# Patient Record
Sex: Female | Born: 1955
Health system: Southern US, Community
[De-identification: ages and names within clinical notes are randomized; demographics above are authoritative.]

## PROBLEM LIST (undated history)

## (undated) DIAGNOSIS — L209 Atopic dermatitis, unspecified: Secondary | ICD-10-CM

## (undated) DIAGNOSIS — M5417 Radiculopathy, lumbosacral region: Secondary | ICD-10-CM

## (undated) DIAGNOSIS — Z720 Tobacco use: Secondary | ICD-10-CM

## (undated) DIAGNOSIS — I10 Essential (primary) hypertension: Secondary | ICD-10-CM

## (undated) DIAGNOSIS — M1990 Unspecified osteoarthritis, unspecified site: Secondary | ICD-10-CM

## (undated) DIAGNOSIS — H548 Legal blindness, as defined in USA: Secondary | ICD-10-CM

## (undated) DIAGNOSIS — G4730 Sleep apnea, unspecified: Secondary | ICD-10-CM

## (undated) DIAGNOSIS — K219 Gastro-esophageal reflux disease without esophagitis: Secondary | ICD-10-CM

## (undated) DIAGNOSIS — G8929 Other chronic pain: Secondary | ICD-10-CM

## (undated) DIAGNOSIS — IMO0002 Reserved for concepts with insufficient information to code with codable children: Secondary | ICD-10-CM

## (undated) DIAGNOSIS — M545 Low back pain, unspecified: Secondary | ICD-10-CM

## (undated) DIAGNOSIS — J449 Chronic obstructive pulmonary disease, unspecified: Secondary | ICD-10-CM

## (undated) DIAGNOSIS — R809 Proteinuria, unspecified: Secondary | ICD-10-CM

## (undated) DIAGNOSIS — T7840XA Allergy, unspecified, initial encounter: Secondary | ICD-10-CM

## (undated) DIAGNOSIS — E119 Type 2 diabetes mellitus without complications: Secondary | ICD-10-CM

## (undated) DIAGNOSIS — K029 Dental caries, unspecified: Secondary | ICD-10-CM

## (undated) DIAGNOSIS — M8580 Other specified disorders of bone density and structure, unspecified site: Secondary | ICD-10-CM

## (undated) DIAGNOSIS — E2839 Other primary ovarian failure: Secondary | ICD-10-CM

## (undated) DIAGNOSIS — E785 Hyperlipidemia, unspecified: Secondary | ICD-10-CM

## (undated) DIAGNOSIS — R0600 Dyspnea, unspecified: Secondary | ICD-10-CM

## (undated) DIAGNOSIS — R6889 Other general symptoms and signs: Secondary | ICD-10-CM

## (undated) HISTORY — DX: Reserved for concepts with insufficient information to code with codable children: IMO0002

## (undated) HISTORY — DX: Low back pain: M54.5

## (undated) HISTORY — DX: Low back pain, unspecified: M54.50

## (undated) HISTORY — DX: Other primary ovarian failure: E28.39

## (undated) HISTORY — DX: Other specified disorders of bone density and structure, unspecified site: M85.80

## (undated) HISTORY — DX: Radiculopathy, lumbosacral region: M54.17

## (undated) HISTORY — DX: Allergy, unspecified, initial encounter: T78.40XA

## (undated) HISTORY — DX: Type 2 diabetes mellitus without complications: E11.9

## (undated) HISTORY — DX: Chronic obstructive pulmonary disease, unspecified: J44.9

## (undated) HISTORY — DX: Gastro-esophageal reflux disease without esophagitis: K21.9

## (undated) HISTORY — PX: EYE SURGERY: SHX253

## (undated) HISTORY — DX: Dental caries, unspecified: K02.9

## (undated) HISTORY — DX: Proteinuria, unspecified: R80.9

## (undated) HISTORY — DX: Hyperlipidemia, unspecified: E78.5

## (undated) HISTORY — DX: Atopic dermatitis, unspecified: L20.9

## (undated) HISTORY — DX: Tobacco use: Z72.0

## (undated) HISTORY — DX: Other general symptoms and signs: R68.89

## (undated) HISTORY — DX: Essential (primary) hypertension: I10

## (undated) HISTORY — DX: Other chronic pain: G89.29

## (undated) HISTORY — PX: CORNEAL TRANSPLANT: SHX108

## (undated) NOTE — *Deleted (*Deleted)
[facility]  Name: Abraham Margulies  MRN: [id number] DOB: Jun 04, 1956  Chief Complaint/ HPI  [name],  10 y.o. , female presents for their Follow-Up CCM visit with the clinical pharmacist via telephone due to COVID-19 Pandemic.  PCP : Alba Cory, MD  Their chronic conditions include: DM, HTN, HLD  Office Visits: 04/27/20: Video visit with Henry Russel, PA-C for Rhinosinusitis. Patient started on cetirizine 10 mg daily, doxycycline 100 mg twice daily  03/02/20: Patient presented to Dr. Carlynn Purl for follow-up. Robitussin stopped (Completed course).   Consult Visit: 9/2 Covid-19, [facility], Acute resp failure, 8 day stay  Medications: Outpatient Encounter Medications as of 05/06/2020  Medication Sig  . albuterol (VENTOLIN HFA) 108 (90 Base) MCG/ACT inhaler Inhale 2 puffs into the lungs every 6 (six) hours as needed for wheezing or shortness of breath.  Marland Kitchen ascorbic acid (VITAMIN C) 500 MG tablet Take 1 tablet (500 mg total) by mouth daily.  Marland Kitchen aspirin 81 MG chewable tablet Chew 1 tablet by mouth daily.  Marland Kitchen atorvastatin (LIPITOR) 40 MG tablet Take 1 tablet (40 mg total) by mouth daily. In place of simvastatin  . cetirizine (ZYRTEC) 10 MG tablet Take 1 tablet (10 mg total) by mouth daily.  . cholecalciferol (VITAMIN D3) 25 MCG (1000 UNIT) tablet Take 1 tablet (1,000 Units total) by mouth daily.  . dorzolamide-timolol (COSOPT) 22.3-6.8 MG/ML ophthalmic solution Place 1 drop into both eyes 2 (two) times daily.   . empagliflozin (JARDIANCE) 25 MG TABS tablet Take 1 tablet (25 mg total) by mouth daily.  Marland Kitchen erythromycin ophthalmic ointment SMARTSIG:In Eye(s)  . folic acid (FOLVITE) 1 MG tablet Take 1 mg by mouth daily.   Marland Kitchen gabapentin (NEURONTIN) 100 MG capsule Take 200 mg by mouth 3 (three) times daily.  Marland Kitchen glucose blood test strip Check blood sugar once daily fasting, and up to 4 times daily PRN (Patient taking differently: Check blood sugar once daily fasting, and up to 4 times  daily PRN ACCU CHEK smartview)  . HYDROcodone-acetaminophen (NORCO/VICODIN) 5-325 MG tablet Take 2 tablets by mouth 2 (two) times daily as needed for moderate pain.  Demetra [employer] (SIMPLE DIAGNOSTICS LANCING DEV) MISC Check blood sugar once daily fasting, and up to 4 times daily PRN.  Marland Kitchen losartan (COZAAR) 25 MG tablet Take 0.5 tablets (12.5 mg total) by mouth daily. In place of lisinopril  . metFORMIN (GLUCOPHAGE-XR) 750 MG 24 hr tablet Take 2 tablets (1,500 mg total) by mouth daily with breakfast.  . methotrexate (RHEUMATREX) 2.5 MG tablet Take 15 mg by mouth every Friday.   Marland Kitchen POLYTRIM ophthalmic solution Place into the left eye.  Marland Kitchen rOPINIRole (REQUIP XL) 4 MG 24 hr tablet Take 4 mg by mouth at bedtime.   . triamcinolone ointment (KENALOG) 0.1 % Apply topically 2 (two) times daily.  Marland Kitchen umeclidinium-vilanterol (ANORO ELLIPTA) 62.5-25 MCG/INH AEPB Inhale 1 puff into the lungs daily.  Marland Kitchen VIGAMOX 0.5 % ophthalmic solution   . zinc sulfate 220 (50 Zn) MG capsule Take 1 capsule (220 mg total) by mouth daily.   No facility-administered encounter medications on file as of 05/06/2020.     Financial Resource Strain: Low Risk   . Difficulty of Paying Living Expenses: Not very hard     Current Diagnosis/Assessment:  Goals Addressed   None    Hypertension   BP goal is:  <130/80  Office blood pressures are  BP Readings from Last 3 Encounters:  03/02/20 108/70  02/11/20 116/77  11/05/19 126/72  Patient checks BP at home infrequently Patient home BP readings are ranging: NA  Patient has failed these meds in the past: NA Patient is currently controlled on the following medications:  . Losartan 25 mg 1/2 tablet daily   We discussed:  Plan  Continue current medications and control with diet and exercise   Hyperlipidemia   LDL goal < 70  Lipid Panel     Component Value Date/Time   CHOL 91 09/05/2019 1108   CHOL 119 04/02/2015 0956   TRIG 56 09/05/2019 1108   HDL 45 (L)  09/05/2019 1108   HDL 49 04/02/2015 0956   LDLCALC 32 09/05/2019 1108    Hepatic Function Latest Ref Rng & Units 02/10/2020 02/09/2020 02/08/2020  Total Protein 6.5 - 8.1 g/dL 7.0 7.2 7.3  Albumin 3.5 - 5.0 g/dL 2.7(L) 2.8(L) 3.0(L)  AST 15 - 41 U/L 27 41 44(H)  ALT 0 - 44 U/L 23 23 20   Alk Phosphatase 38 - 126 U/L 80 84 75  Total Bilirubin 0.3 - 1.2 mg/dL 0.6 0.8 0.8     The ASCVD Risk score Denman George DC Jr., et al., 2013) failed to calculate for the following reasons:   The valid total cholesterol range is 130 to 320 mg/dL   Patient has failed these meds in past: NA Patient is currently controlled on the following medications:  . Atorvastatin 40mg  daily  We discussed:   Plan  Continue current medications  Diabetes   A1c goal <7%  Recent Relevant Labs: Lab Results  Component Value Date/Time   HGBA1C 7.3 (A) 03/02/2020 12:04 PM   HGBA1C 7.1 (A) 09/05/2019 10:05 AM   HGBA1C 7.3 (A) 10/21/2018 09:51 AM   HGBA1C 7.2 (A) 04/19/2018 11:35 AM   HGBA1C 6.5 (A) 01/16/2018 10:56 AM   MICROALBUR 0.2 09/05/2019 11:08 AM   MICROALBUR <0.2 10/21/2018 10:24 AM   MICROALBUR negative 01/16/2018 10:57 AM   MICROALBUR 20 12/13/2016 10:24 AM    Last diabetic Eye exam:  Lab Results  Component Value Date/Time   HMDIABEYEEXA No Retinopathy 06/26/2018 12:00 AM    Last diabetic Foot exam:  Lab Results  Component Value Date/Time   HMDIABFOOTEX normal 08/04/2015 12:00 AM     Checking BG: Daily  Recent FBG Readings: *** Recent pre-meal BG readings: *** Recent 2hr PP BG readings:  *** Recent HS BG readings: ***  Patient has failed these meds in past: n/a Patient is currently controlled on the following medications: . Jardiance 25 mg daily  . Metformin XR 750 mg 2 tabs with breakfast   We discussed: {[facility]L [facility] discussion:[phone]}  Plan  Continue {[facility]L [facility] VHQIO:[phone]}   Tobacco Abuse   Tobacco Status:  Social History   Tobacco Use   Smoking Status Former Smoker  . Packs/day: 0.50  . Years: 46.00  . Pack years: 23.00  . Types: Cigarettes  . Start date: 01/07/1985  . Quit date: 02/01/2020  . Years since quitting: 0.2  Smokeless Tobacco Never Used     Previous quit attempts included: 02/05/20 Patient is currently controlled on the following medications:  . None  We discussed: Has not smoked since leaving the hospital, following Covid-19 acute respiratory failure  Plan  Call [phone] for post quitting support

---

## 1978-06-05 HISTORY — PX: CATARACT EXTRACTION: SUR2

## 2004-09-08 ENCOUNTER — Ambulatory Visit: Payer: Self-pay

## 2005-01-12 ENCOUNTER — Ambulatory Visit: Payer: Self-pay | Admitting: Family Medicine

## 2005-08-01 ENCOUNTER — Emergency Department: Payer: Self-pay | Admitting: Internal Medicine

## 2005-08-01 ENCOUNTER — Ambulatory Visit: Payer: Self-pay | Admitting: Family Medicine

## 2005-08-29 ENCOUNTER — Emergency Department: Payer: Self-pay | Admitting: Emergency Medicine

## 2005-09-22 DIAGNOSIS — I10 Essential (primary) hypertension: Secondary | ICD-10-CM | POA: Insufficient documentation

## 2005-09-22 DIAGNOSIS — E039 Hypothyroidism, unspecified: Secondary | ICD-10-CM | POA: Insufficient documentation

## 2006-07-08 ENCOUNTER — Emergency Department: Payer: Self-pay | Admitting: Internal Medicine

## 2007-03-21 ENCOUNTER — Emergency Department: Payer: Self-pay | Admitting: Emergency Medicine

## 2009-04-22 ENCOUNTER — Ambulatory Visit: Payer: Self-pay | Admitting: Family Medicine

## 2010-06-15 LAB — HM DEXA SCAN: HM Dexa Scan: NORMAL

## 2010-07-04 ENCOUNTER — Ambulatory Visit: Payer: Self-pay | Admitting: Family Medicine

## 2010-07-05 LAB — HM COLONOSCOPY

## 2012-04-10 ENCOUNTER — Ambulatory Visit: Payer: Self-pay | Admitting: Family Medicine

## 2013-09-03 DIAGNOSIS — IMO0002 Reserved for concepts with insufficient information to code with codable children: Secondary | ICD-10-CM

## 2013-09-03 HISTORY — DX: Reserved for concepts with insufficient information to code with codable children: IMO0002

## 2013-10-02 LAB — HM PAP SMEAR: HM PAP: ABNORMAL

## 2013-11-12 ENCOUNTER — Ambulatory Visit: Payer: Self-pay | Admitting: Family Medicine

## 2013-11-12 LAB — HM MAMMOGRAPHY: HM MAMMO: NORMAL

## 2013-11-14 ENCOUNTER — Ambulatory Visit: Payer: Self-pay | Admitting: Family Medicine

## 2014-03-23 LAB — LIPID PANEL
Cholesterol: 115 mg/dL (ref 0–200)
HDL: 48 mg/dL (ref 35–70)
LDL Cholesterol: 55 mg/dL
Triglycerides: 62 mg/dL (ref 40–160)

## 2014-08-27 LAB — HEMOGLOBIN A1C: Hgb A1c MFr Bld: 6.8 % — AB (ref 4.0–6.0)

## 2014-11-12 ENCOUNTER — Telehealth: Payer: Self-pay | Admitting: Family Medicine

## 2014-11-12 NOTE — Telephone Encounter (Signed)
Pt has been coughing for the last 1 week and would like to know what she can take

## 2014-11-12 NOTE — Telephone Encounter (Signed)
Pt wants to know what is safe for her to take for coughing?

## 2014-11-13 NOTE — Telephone Encounter (Signed)
Patient notified

## 2014-11-13 NOTE — Telephone Encounter (Signed)
She can take any otc cough medication and monitor her bp at home.

## 2014-12-16 ENCOUNTER — Encounter: Payer: Self-pay | Admitting: Family Medicine

## 2014-12-28 ENCOUNTER — Encounter: Payer: Self-pay | Admitting: Family Medicine

## 2014-12-28 DIAGNOSIS — R809 Proteinuria, unspecified: Secondary | ICD-10-CM | POA: Insufficient documentation

## 2014-12-28 DIAGNOSIS — L209 Atopic dermatitis, unspecified: Secondary | ICD-10-CM | POA: Insufficient documentation

## 2014-12-28 DIAGNOSIS — K219 Gastro-esophageal reflux disease without esophagitis: Secondary | ICD-10-CM | POA: Insufficient documentation

## 2014-12-28 DIAGNOSIS — M949 Disorder of cartilage, unspecified: Secondary | ICD-10-CM

## 2014-12-28 DIAGNOSIS — Z720 Tobacco use: Secondary | ICD-10-CM | POA: Insufficient documentation

## 2014-12-28 DIAGNOSIS — M792 Neuralgia and neuritis, unspecified: Secondary | ICD-10-CM | POA: Insufficient documentation

## 2014-12-28 DIAGNOSIS — J3089 Other allergic rhinitis: Secondary | ICD-10-CM

## 2014-12-28 DIAGNOSIS — G8929 Other chronic pain: Secondary | ICD-10-CM | POA: Insufficient documentation

## 2014-12-28 DIAGNOSIS — M899 Disorder of bone, unspecified: Secondary | ICD-10-CM | POA: Insufficient documentation

## 2014-12-28 DIAGNOSIS — G5792 Unspecified mononeuropathy of left lower limb: Secondary | ICD-10-CM | POA: Insufficient documentation

## 2014-12-28 DIAGNOSIS — M545 Low back pain, unspecified: Secondary | ICD-10-CM | POA: Insufficient documentation

## 2014-12-28 DIAGNOSIS — J302 Other seasonal allergic rhinitis: Secondary | ICD-10-CM | POA: Insufficient documentation

## 2014-12-28 DIAGNOSIS — E785 Hyperlipidemia, unspecified: Secondary | ICD-10-CM | POA: Insufficient documentation

## 2015-01-08 ENCOUNTER — Encounter: Payer: Self-pay | Admitting: Family Medicine

## 2015-01-08 ENCOUNTER — Ambulatory Visit (INDEPENDENT_AMBULATORY_CARE_PROVIDER_SITE_OTHER): Payer: Medicare Other | Admitting: Family Medicine

## 2015-01-08 VITALS — BP 126/64 | HR 96 | Temp 98.0°F | Resp 18 | Ht 58.25 in | Wt 184.1 lb

## 2015-01-08 DIAGNOSIS — M545 Low back pain, unspecified: Secondary | ICD-10-CM

## 2015-01-08 DIAGNOSIS — G2581 Restless legs syndrome: Secondary | ICD-10-CM | POA: Diagnosis not present

## 2015-01-08 DIAGNOSIS — K429 Umbilical hernia without obstruction or gangrene: Secondary | ICD-10-CM

## 2015-01-08 DIAGNOSIS — J449 Chronic obstructive pulmonary disease, unspecified: Secondary | ICD-10-CM | POA: Diagnosis not present

## 2015-01-08 DIAGNOSIS — E785 Hyperlipidemia, unspecified: Secondary | ICD-10-CM | POA: Diagnosis not present

## 2015-01-08 DIAGNOSIS — Z23 Encounter for immunization: Secondary | ICD-10-CM | POA: Diagnosis not present

## 2015-01-08 DIAGNOSIS — E1129 Type 2 diabetes mellitus with other diabetic kidney complication: Secondary | ICD-10-CM

## 2015-01-08 DIAGNOSIS — G8929 Other chronic pain: Secondary | ICD-10-CM

## 2015-01-08 DIAGNOSIS — G9519 Other vascular myelopathies: Secondary | ICD-10-CM

## 2015-01-08 DIAGNOSIS — R809 Proteinuria, unspecified: Secondary | ICD-10-CM | POA: Diagnosis not present

## 2015-01-08 DIAGNOSIS — E1169 Type 2 diabetes mellitus with other specified complication: Secondary | ICD-10-CM | POA: Insufficient documentation

## 2015-01-08 DIAGNOSIS — Z79899 Other long term (current) drug therapy: Secondary | ICD-10-CM | POA: Diagnosis not present

## 2015-01-08 DIAGNOSIS — Z720 Tobacco use: Secondary | ICD-10-CM | POA: Diagnosis not present

## 2015-01-08 DIAGNOSIS — IMO0001 Reserved for inherently not codable concepts without codable children: Secondary | ICD-10-CM

## 2015-01-08 DIAGNOSIS — M4806 Spinal stenosis, lumbar region: Secondary | ICD-10-CM | POA: Diagnosis not present

## 2015-01-08 DIAGNOSIS — R29818 Other symptoms and signs involving the nervous system: Secondary | ICD-10-CM

## 2015-01-08 DIAGNOSIS — M48062 Spinal stenosis, lumbar region with neurogenic claudication: Secondary | ICD-10-CM

## 2015-01-08 LAB — POCT UA - MICROALBUMIN: Microalbumin Ur, POC: 20 mg/L

## 2015-01-08 LAB — POCT GLYCOSYLATED HEMOGLOBIN (HGB A1C): Hemoglobin A1C: 6.7

## 2015-01-08 MED ORDER — SITAGLIPTIN PHOS-METFORMIN HCL 50-1000 MG PO TABS: 1.0000 | ORAL_TABLET | Freq: Two times a day (BID) | ORAL | Status: DC

## 2015-01-08 MED ORDER — GABAPENTIN 300 MG PO CAPS: 300.0000 mg | ORAL_CAPSULE | Freq: Three times a day (TID) | ORAL | Status: DC

## 2015-01-08 MED ORDER — HYDROCODONE-ACETAMINOPHEN 5-300 MG PO TABS: 1.0000 | ORAL_TABLET | Freq: Two times a day (BID) | ORAL | Status: DC | PRN

## 2015-01-08 MED ORDER — SIMVASTATIN 10 MG PO TABS: 10.0000 mg | ORAL_TABLET | Freq: Every day | ORAL | Status: DC

## 2015-01-08 MED ORDER — LISINOPRIL 5 MG PO TABS: 5.0000 mg | ORAL_TABLET | Freq: Every day | ORAL | Status: DC

## 2015-01-08 MED ORDER — ROPINIROLE HCL 1 MG PO TABS: 1.0000 mg | ORAL_TABLET | Freq: Every day | ORAL | Status: DC

## 2015-01-08 MED ORDER — HYDROCODONE-ACETAMINOPHEN 5-325 MG PO TABS: 2.0000 | ORAL_TABLET | Freq: Two times a day (BID) | ORAL | Status: DC

## 2015-01-08 NOTE — Progress Notes (Signed)
Name: Julie Wall   MRN: [id number]    DOB: 12-Jun-1955   Date:01/08/2015       Progress Note  Subjective  Chief Complaint  Chief Complaint  Patient presents with  . Medication Refill    4 month F/U  . Diabetes    Checks sugar once daily Average-103 High-145  . Hyperlipidemia    Muscle Cramps  . Gastrophageal Reflux    Still having some burning  . COPD    SOB    HPI  DMII: she has neurogenic claudication and history of proteinuria.  She has been compliant with medication and hgbA1C is at goal. Denies polyphagia, polydipsia or polyuria.    Hyperlipidemia: taking simvastatin and is due for labs, denies side effects of medications  COPD: she still smokes daily, has intermittent cough, but is not productive, only using Combivent prn.   Back pain: she has back pain most days of the week, pain now is zero but at times can go up to 9/10. Worse with increase in activity, taking hydrocodone prn and flexeril at night sometimes to control symptoms. She denies paresthesia or leg weakness, no bowel or bladder incontinence  RLS: controlled with medication   Patient Active Problem List   Diagnosis Date Noted  . RLS (restless legs syndrome) 01/08/2015  . Type 2 diabetes mellitus with other diabetic kidney complication 01/08/2015  . AD (atopic dermatitis) 12/28/2014  . Chronic LBP 12/28/2014  . Dyslipidemia 12/28/2014  . Gastro-esophageal reflux disease without esophagitis 12/28/2014  . Microalbuminuria 12/28/2014  . Disorder of bone and cartilage 12/28/2014  . Obesity (BMI 30-39.9) 12/28/2014  . Neuralgia of left thigh 12/28/2014  . Perennial allergic rhinitis with seasonal variation 12/28/2014  . Tobacco abuse 12/28/2014    Past Surgical History  Procedure Laterality Date  . Cesarean section    . Cataract extraction Bilateral 1980  . Corneal transplant Left     [facility]-Treatment for blindness.    Family History  Problem Relation Age of Onset  . Diabetes Mother   . Hypertension  Mother   . Alcohol abuse Father     History   Social History  . Marital Status: Single    Spouse Name: N/A  . Number of Children: N/A  . Years of Education: N/A   Occupational History  . Not on file.   Social History Main Topics  . Smoking status: Current Every Day Smoker -- 0.75 packs/day for 30 years    Types: Cigarettes    Start date: 01/07/1985  . Smokeless tobacco: Never Used  . Alcohol Use: No  . Drug Use: No  . Sexual Activity: No   Other Topics Concern  . Not on file   Social History Narrative     Current outpatient prescriptions:  .  aspirin 81 MG chewable tablet, Chew 1 tablet by mouth daily., Disp: , Rfl:  .  cyclobenzaprine (FLEXERIL) 5 MG tablet, Take 1 tablet by mouth at bedtime., Disp: , Rfl:  .  fluticasone (FLONASE) 50 MCG/ACT nasal spray, Place 2 sprays into the nose at bedtime., Disp: , Rfl:  .  gabapentin (NEURONTIN) 300 MG capsule, Take 1 capsule (300 mg total) by mouth 3 (three) times daily., Disp: 90 capsule, Rfl: 5 .  Hydrocodone-Acetaminophen (VICODIN) 5-300 MG TABS, Take 1 tablet by mouth 2 (two) times daily as needed., Disp: 60 each, Rfl: 0 .  Ipratropium-Albuterol (COMBIVENT RESPIMAT) 20-100 MCG/ACT AERS respimat, Inhale 1 puff into the lungs 4 (four) times daily., Disp: , Rfl:  .  meloxicam (MOBIC) 7.5 MG tablet, Take 1 tablet by mouth daily., Disp: , Rfl:  .  METHOTREXATE, ANTI-RHEUMATIC, PO, Take 3 tablets by mouth once a week., Disp: , Rfl:  .  omeprazole (PRILOSEC) 20 MG capsule, Take 1 capsule by mouth every morning., Disp: , Rfl:  .  simvastatin (ZOCOR) 10 MG tablet, Take 1 tablet (10 mg total) by mouth daily., Disp: 30 tablet, Rfl: 5 .  sitaGLIPtin-metformin (JANUMET) 50-1000 MG per tablet, Take 1 tablet by mouth 2 (two) times daily., Disp: 60 tablet, Rfl: 5 .  triamcinolone cream (KENALOG) 0.1 %, TRIAMCINOLONE ACETONIDE, 0.1% (External Cream)  use per dermatologist. for 0 days  Quantity: 1.00;  Refills: 0   Ordered :15-Jun-2010  Alba Cory MD;  Started 02-Apr-2008 Active Comments: DX: 724.2, Disp: , Rfl:  .  lisinopril (PRINIVIL,ZESTRIL) 5 MG tablet, Take 1 tablet (5 mg total) by mouth daily., Disp: 30 tablet, Rfl: 5 .  rOPINIRole (REQUIP) 1 MG tablet, Take 1 tablet (1 mg total) by mouth daily., Disp: 30 tablet, Rfl: 5  Allergies  Allergen Reactions  . Penicillins      ROS  Constitutional: Negative for fever or weight change.  Respiratory: positive  for cough and shortness of breath.   Cardiovascular: Negative for chest pain or palpitations.  Gastrointestinal: Negative for abdominal pain, no bowel changes.  Musculoskeletal: Negative for gait problem or joint swelling.  Skin: Negative for rash.  Neurological: Negative for dizziness or headache. She has aching legs all the time No other specific complaints in a complete review of systems (except as listed in HPI above). Objective  Filed Vitals:   01/08/15 0826 01/08/15 0859  BP: 126/64   Pulse: 115 96  Temp: 98 F (36.7 C)   TempSrc: Oral   Resp: 18   Height: 4' 10.25" (1.48 m)   Weight: 184 lb 1.6 oz (83.507 kg)   SpO2: 98%     Body mass index is 38.12 kg/(m^2).  Physical Exam  Constitutional: Patient appears well-developed and well-nourished. Obese No distress.  Eyes:  No scleral icterus. PERL Neck: Normal range of motion. Neck supple. Cardiovascular: Normal rate, regular rhythm and normal heart sounds.  No murmur heard. No BLE edema. Pulmonary/Chest: Effort normal and breath sounds normal. No respiratory distress. Abdominal: Soft.  There is no tenderness. Umbilical hernia, some left flank pain that seems muscular, neg CVA tenderness Psychiatric: Patient has a normal mood and affect. behavior is normal. Judgment and thought content normal. Skin: hypochromic patches all over, eczema under control , hyperpigmentation on both cheeks  Muscular skeletal: pain during palpation of thoracic and lumbar spine , normal rom of lumbar spine and negative  straight leg raise  Recent Results (from the past 2160 hour(s))  POCT HgB A1C     Status: None   Collection Time: 01/08/15  8:37 AM  Result Value Ref Range   Hemoglobin A1C 6.7   POCT UA - Microalbumin     Status: None   Collection Time: 01/08/15  8:37 AM  Result Value Ref Range   Microalbumin Ur, POC 20 mg/L   Creatinine, POC  mg/dL   Albumin/Creatinine Ratio, Urine, POC      Diabetic Foot Exam: Diabetic Foot Exam - Simple   Simple Foot Form  Visual Inspection  No deformities, no ulcerations, no other skin breakdown bilaterally:  Yes  Sensation Testing  Intact to touch and monofilament testing bilaterally:  Yes  Pulse Check  Posterior Tibialis and Dorsalis pulse intact bilaterally:  Yes  Comments      PHQ2/9: Depression screen PHQ 2/9 01/08/2015  Decreased Interest 0  Down, Depressed, Hopeless 0  PHQ - 2 Score 0     Fall Risk: Fall Risk  01/08/2015  Falls in the past year? No     Assessment & Plan  1. Type 2 diabetes mellitus with other diabetic kidney complication  - POCT HgB A1C - POCT UA - Microalbumin - sitaGLIPtin-metformin (JANUMET) 50-1000 MG per tablet; Take 1 tablet by mouth 2 (two) times daily.  Dispense: 60 tablet; Refill: 5 - lisinopril (PRINIVIL,ZESTRIL) 5 MG tablet; Take 1 tablet (5 mg total) by mouth daily.  Dispense: 30 tablet; Refill: 5  2. COPD with bronchitis FEV1/FVC within normal limits - Spirometry: Peak  3. Tobacco abuse Discussed importance of quitting again  4. Microalbuminuria Resume ace low dose - lisinopril (PRINIVIL,ZESTRIL) 5 MG tablet; Take 1 tablet (5 mg total) by mouth daily.  Dispense: 30 tablet; Refill: 5  5. Dyslipidemia  - simvastatin (ZOCOR) 10 MG tablet; Take 1 tablet (10 mg total) by mouth daily.  Dispense: 30 tablet; Refill: 5 - Lipid Profile  6. RLS (restless legs syndrome)  - rOPINIRole (REQUIP) 1 MG tablet; Take 1 tablet (1 mg total) by mouth daily.  Dispense: 30 tablet; Refill: 5  7. Chronic LBP Taking  Hydrocodone prn . During exam some pain on left flank , but negative CVA, seems muscular , advised to take Flexeril for the next few days - gabapentin (NEURONTIN) 300 MG capsule; Take 1 capsule (300 mg total) by mouth 3 (three) times daily.  Dispense: 90 capsule; Refill: 5 - Hydrocodone-Acetaminophen (VICODIN) 5-325 MG TABS; Take 1 tablet by mouth 2 (two) times daily as needed.  Dispense: 60 each; Refill: 0  8. [place]-term use of high-risk medication  - Comprehensive Metabolic Panel (CMET)

## 2015-01-11 ENCOUNTER — Telehealth: Payer: Self-pay | Admitting: Family Medicine

## 2015-01-11 NOTE — Telephone Encounter (Signed)
Patient was seen last week and now have developed cold symptoms. Cough, nose and chest congestion. No fever. [facility]

## 2015-01-11 NOTE — Telephone Encounter (Signed)
She needs to take otc cold medication. Dayquil, Nyquil... Nasal saline , rest and come in if no improvement in 10 days

## 2015-01-11 NOTE — Telephone Encounter (Signed)
Pt.notified

## 2015-04-01 ENCOUNTER — Ambulatory Visit
Admission: RE | Admit: 2015-04-01 | Discharge: 2015-04-01 | Disposition: A | Discharge status: Home / Self Care | Payer: Medicare Other | Source: Ambulatory Visit | Attending: Family Medicine | Admitting: Family Medicine

## 2015-04-01 ENCOUNTER — Ambulatory Visit (INDEPENDENT_AMBULATORY_CARE_PROVIDER_SITE_OTHER): Payer: Medicare Other | Admitting: Family Medicine

## 2015-04-01 ENCOUNTER — Encounter: Payer: Self-pay | Admitting: Family Medicine

## 2015-04-01 VITALS — BP 106/62 | HR 103 | Temp 98.3°F | Resp 16 | Ht 61.0 in | Wt 182.9 lb

## 2015-04-01 DIAGNOSIS — R1012 Left upper quadrant pain: Secondary | ICD-10-CM

## 2015-04-01 DIAGNOSIS — E785 Hyperlipidemia, unspecified: Secondary | ICD-10-CM

## 2015-04-01 DIAGNOSIS — Z23 Encounter for immunization: Secondary | ICD-10-CM | POA: Diagnosis not present

## 2015-04-01 DIAGNOSIS — K429 Umbilical hernia without obstruction or gangrene: Secondary | ICD-10-CM | POA: Insufficient documentation

## 2015-04-01 DIAGNOSIS — J45909 Unspecified asthma, uncomplicated: Secondary | ICD-10-CM | POA: Diagnosis not present

## 2015-04-01 DIAGNOSIS — R062 Wheezing: Secondary | ICD-10-CM | POA: Diagnosis not present

## 2015-04-01 DIAGNOSIS — Q792 Exomphalos: Secondary | ICD-10-CM | POA: Insufficient documentation

## 2015-04-01 DIAGNOSIS — R140 Abdominal distension (gaseous): Secondary | ICD-10-CM

## 2015-04-01 DIAGNOSIS — Z79899 Other long term (current) drug therapy: Secondary | ICD-10-CM

## 2015-04-01 DIAGNOSIS — J209 Acute bronchitis, unspecified: Secondary | ICD-10-CM

## 2015-04-01 DIAGNOSIS — R110 Nausea: Secondary | ICD-10-CM

## 2015-04-01 MED ORDER — IPRATROPIUM-ALBUTEROL 20-100 MCG/ACT IN AERS: 1.0000 | INHALATION_SPRAY | Freq: Four times a day (QID) | RESPIRATORY_TRACT | Status: DC

## 2015-04-01 MED ORDER — FLUTICASONE FUROATE-VILANTEROL 100-25 MCG/INH IN AEPB: 1.0000 | INHALATION_SPRAY | Freq: Every day | RESPIRATORY_TRACT | Status: DC

## 2015-04-01 NOTE — Progress Notes (Signed)
Name: Julie Wall   MRN: [id number]    DOB: 12/07/1955   Date:04/01/2015       Progress Note  Subjective  Chief Complaint  Chief Complaint  Patient presents with  . Flank Pain    upper left onset 3 months.  Pt describes as a lump feeling and worsening when she lays down    HPI  Wheezing: she continues to smoke, over the past few weeks has noticed daily wheezing, dry cough and decrease in exercise tolerance. No fever, no chills. She also has sneezing, and some clear rhinorrhea. Normal spirometry on her last visit.   Left Upper Quadrant pain: she has been having recurrent symptoms over the past couple of months. Pain is on left flank or left upper quadrant, feels a ball that grows inside of her. Worse at night when trying to fall asleep and has to toss and turn to get in a comfortable position, she also has nausea ( watery mouth ), no vomiting, no change in bowel movement, no dysuria, no hematuria. Sometimes bloating.    Patient Active Problem List   Diagnosis Date Noted  . Umbilical hernia 04/01/2015  . RLS (restless legs syndrome) 01/08/2015  . Type 2 diabetes mellitus with other diabetic kidney complication (HCC) 01/08/2015  . Neurogenic claudication 01/08/2015  . AD (atopic dermatitis) 12/28/2014  . Chronic LBP 12/28/2014  . Dyslipidemia 12/28/2014  . Gastro-esophageal reflux disease without esophagitis 12/28/2014  . Microalbuminuria 12/28/2014  . Disorder of bone and cartilage 12/28/2014  . Obesity (BMI 30-39.9) 12/28/2014  . Neuralgia of left thigh 12/28/2014  . Perennial allergic rhinitis with seasonal variation 12/28/2014  . Tobacco abuse 12/28/2014    Past Surgical History  Procedure Laterality Date  . Cesarean section    . Cataract extraction Bilateral 1980  . Corneal transplant Left     [facility]-Treatment for blindness.    Family History  Problem Relation Age of Onset  . Diabetes Mother   . Hypertension Mother   . Alcohol abuse Father     Social History    Social History  . Marital Status: Single    Spouse Name: N/A  . Number of Children: N/A  . Years of Education: N/A   Occupational History  . Not on file.   Social History Main Topics  . Smoking status: Current Every Day Smoker -- 0.75 packs/day for 30 years    Types: Cigarettes    Start date: 01/07/1985  . Smokeless tobacco: Never Used  . Alcohol Use: No  . Drug Use: No  . Sexual Activity: No   Other Topics Concern  . Not on file   Social History Narrative     Current outpatient prescriptions:  .  aspirin 81 MG chewable tablet, Chew 1 tablet by mouth daily., Disp: , Rfl:  .  cyclobenzaprine (FLEXERIL) 5 MG tablet, Take 1 tablet by mouth at bedtime., Disp: , Rfl:  .  fluticasone (FLONASE) 50 MCG/ACT nasal spray, Place 2 sprays into the nose at bedtime., Disp: , Rfl:  .  Fluticasone Furoate-Vilanterol (BREO ELLIPTA) 100-25 MCG/INH AEPB, Inhale 1 puff into the lungs daily., Disp: 28 each, Rfl: 0 .  gabapentin (NEURONTIN) 300 MG capsule, Take 1 capsule (300 mg total) by mouth 3 (three) times daily., Disp: 90 capsule, Rfl: 5 .  HYDROcodone-acetaminophen (NORCO/VICODIN) 5-325 MG per tablet, Take 2 tablets by mouth 2 (two) times daily., Disp: 60 tablet, Rfl: 0 .  Ipratropium-Albuterol (COMBIVENT RESPIMAT) 20-100 MCG/ACT AERS respimat, Inhale 1 puff into the lungs 4 (  four) times daily., Disp: 1 Inhaler, Rfl: 2 .  lisinopril (PRINIVIL,ZESTRIL) 5 MG tablet, Take 1 tablet (5 mg total) by mouth daily., Disp: 30 tablet, Rfl: 5 .  meloxicam (MOBIC) 7.5 MG tablet, Take 1 tablet by mouth daily., Disp: , Rfl:  .  METHOTREXATE, ANTI-RHEUMATIC, PO, Take 3 tablets by mouth once a week., Disp: , Rfl:  .  omeprazole (PRILOSEC) 20 MG capsule, Take 1 capsule by mouth every morning., Disp: , Rfl:  .  rOPINIRole (REQUIP) 1 MG tablet, Take 1 tablet (1 mg total) by mouth daily., Disp: 30 tablet, Rfl: 5 .  simvastatin (ZOCOR) 10 MG tablet, Take 1 tablet (10 mg total) by mouth daily., Disp: 30 tablet,  Rfl: 5 .  sitaGLIPtin-metformin (JANUMET) 50-1000 MG per tablet, Take 1 tablet by mouth 2 (two) times daily., Disp: 60 tablet, Rfl: 5 .  triamcinolone cream (KENALOG) 0.1 %, TRIAMCINOLONE ACETONIDE, 0.1% (External Cream)  use per dermatologist. for 0 days  Quantity: 1.00;  Refills: 0   Ordered :15-Jun-2010  Alba Cory MD;  Started 02-Apr-2008 Active Comments: DX: 724.2, Disp: , Rfl:   Allergies  Allergen Reactions  . Penicillins      ROS  Constitutional: Negative for fever or weight change.  Respiratory: Positive for cough and shortness of breath.   Cardiovascular: Negative for chest pain or palpitations.  Gastrointestinal: Positive for abdominal pain, no bowel changes.  Musculoskeletal: Negative for gait problem or joint swelling.  Skin: chronic rash Neurological: Negative for dizziness or headache.  No other specific complaints in a complete review of systems (except as listed in HPI above).  Objective  Filed Vitals:   04/01/15 1354  BP: 106/62  Pulse: 103  Temp: 98.3 F (36.8 C)  TempSrc: Oral  Resp: 16  Height:  (1.549 m)  Weight: 182 lb 14.4 oz (82.963 kg)  SpO2: 92%    Body mass index is 34.58 kg/(m^2).  Physical Exam  Constitutional: Patient appears well-developed and well-nourished. Obese  No distress.  HEENT: head atraumatic, normocephalic, pupils equal and reactive to light, neck supple, throat within normal limits Cardiovascular: Normal rate, regular rhythm and normal heart sounds.  No murmur heard. No BLE edema. Pulmonary/Chest: Effort normal and breath sounds normal. No respiratory distress. Abdominal: Soft.  There is no tenderness. Reducible umbilical hernia, negative CVA tenderness, no abdominal pain during exam.  Psychiatric: Patient has a normal mood and affect. behavior is normal. Judgment and thought content normal.  Recent Results (from the past 2160 hour(s))  POCT HgB A1C     Status: None   Collection Time: 01/08/15  8:37 AM  Result  Value Ref Range   Hemoglobin A1C 6.7   POCT UA - Microalbumin     Status: None   Collection Time: 01/08/15  8:37 AM  Result Value Ref Range   Microalbumin Ur, POC 20 mg/L   Creatinine, POC  mg/dL   Albumin/Creatinine Ratio, Urine, POC      PHQ2/9: Depression screen [facility] 2/9 04/01/2015 01/08/2015  Decreased Interest 0 0  Down, Depressed, Hopeless 0 0  PHQ - 2 Score 0 0     Fall Risk: Fall Risk  04/01/2015 01/08/2015  Falls in the past year? No No     Functional Status Survey: Is the patient deaf or have difficulty hearing?: No Does the patient have difficulty seeing, even when wearing glasses/contacts?: Yes (glasses) Does the patient have difficulty concentrating, remembering, or making decisions?: No Does the patient have difficulty walking or climbing stairs?: No Does  the patient have difficulty dressing or bathing?: No Does the patient have difficulty doing errands alone such as visiting a doctor's office or shopping?: No   Assessment & Plan  1. Left upper quadrant pain  Check labs, check [place] and is negative it may be muscular - states lean on her left side while washing dishes, advised to stop doing that.  - CBC with Differential/Platelet - Lipase - US Abdomen Complete; Future  2. Wheezing  Bronchitis, check CXR, start inhalers, needs to quit smoking and if no improvement return for follow up - DG Chest 2 View; Future - Ipratropium-Albuterol (COMBIVENT RESPIMAT) 20-100 MCG/ACT AERS respimat; Inhale 1 puff into the lungs 4 (four) times daily.  Dispense: 1 Inhaler; Refill: 2 - Fluticasone Furoate-Vilanterol (BREO ELLIPTA) 100-25 MCG/INH AEPB; Inhale 1 puff into the lungs daily.  Dispense: 28 each; Refill: 0  3. Dyslipidemia  - Lipid panel  4. Needs flu shot  - Flu Vaccine QUAD 36+ mos IM  5. Need for pneumococcal vaccination  - Pneumococcal conjugate vaccine 13-valent IM  6. [place]-term use of high-risk medication  - Comprehensive metabolic panel  7.  Umbilical hernia without obstruction and without gangrene   8. Bloating  - H. pylori antibody, IgG - US Abdomen Complete; Future  9. Nausea  - US Abdomen Complete; Future  10. Acute bronchitis, unspecified organism  - Fluticasone Furoate-Vilanterol (BREO ELLIPTA) 100-25 MCG/INH AEPB; Inhale 1 puff into the lungs daily.  Dispense: 28 each; Refill: 0

## 2015-04-02 ENCOUNTER — Other Ambulatory Visit: Payer: Self-pay | Admitting: Family Medicine

## 2015-04-03 LAB — COMPREHENSIVE METABOLIC PANEL
ALK PHOS: 80 IU/L (ref 39–117)
ALT: 11 IU/L (ref 0–32)
AST: 11 IU/L (ref 0–40)
Albumin/Globulin Ratio: 1.4 (ref 1.1–2.5)
Albumin: 4 g/dL (ref 3.6–4.8)
BILIRUBIN TOTAL: 0.2 mg/dL (ref 0.0–1.2)
BUN / CREAT RATIO: 22 (ref 11–26)
BUN: 12 mg/dL (ref 8–27)
CHLORIDE: 105 mmol/L (ref 97–106)
CO2: 21 mmol/L (ref 18–29)
Calcium: 9.2 mg/dL (ref 8.7–10.3)
Creatinine, Ser: 0.55 mg/dL — ABNORMAL LOW (ref 0.57–1.00)
GFR calc Af Amer: 118 mL/min/{1.73_m2} (ref >59)
GFR calc non Af Amer: 102 mL/min/{1.73_m2} (ref >59)
GLUCOSE: 94 mg/dL (ref 65–99)
Globulin, Total: 2.8 g/dL (ref 1.5–4.5)
Potassium: 4.1 mmol/L (ref 3.5–5.2)
Sodium: 140 mmol/L (ref 136–144)
Total Protein: 6.8 g/dL (ref 6.0–8.5)

## 2015-04-03 LAB — CBC WITH DIFFERENTIAL/PLATELET
BASOS ABS: 0 10*3/uL (ref 0.0–0.2)
Basos: 0 %
EOS (ABSOLUTE): 0.4 10*3/uL (ref 0.0–0.4)
Eos: 5 %
Hematocrit: 39.6 % (ref 34.0–46.6)
Hemoglobin: 13.8 g/dL (ref 11.1–15.9)
Immature Grans (Abs): 0 10*3/uL (ref 0.0–0.1)
Immature Granulocytes: 0 %
LYMPHS ABS: 1.7 10*3/uL (ref 0.7–3.1)
Lymphs: 22 %
MCH: 31.9 pg (ref 26.6–33.0)
MCHC: 34.8 g/dL (ref 31.5–35.7)
MCV: 92 fL (ref 79–97)
Monocytes Absolute: 0.3 10*3/uL (ref 0.1–0.9)
Monocytes: 4 %
NEUTROS ABS: 5.5 10*3/uL (ref 1.4–7.0)
Neutrophils: 69 %
PLATELETS: 261 10*3/uL (ref 150–379)
RBC: 4.33 x10E6/uL (ref 3.77–5.28)
RDW: 13.7 % (ref 12.3–15.4)
WBC: 7.9 10*3/uL (ref 3.4–10.8)

## 2015-04-03 LAB — LIPASE: Lipase: 56 U/L (ref 0–59)

## 2015-04-03 LAB — LIPID PANEL
CHOLESTEROL TOTAL: 119 mg/dL (ref 100–199)
Chol/HDL Ratio: 2.4 ratio units (ref 0.0–4.4)
HDL: 49 mg/dL (ref >39)
LDL Calculated: 55 mg/dL (ref 0–99)
TRIGLYCERIDES: 73 mg/dL (ref 0–149)
VLDL CHOLESTEROL CAL: 15 mg/dL (ref 5–40)

## 2015-04-03 LAB — H. PYLORI ANTIBODY, IGG: H Pylori IgG: 5.5 U/mL — ABNORMAL HIGH (ref 0.0–0.8)

## 2015-04-05 ENCOUNTER — Ambulatory Visit
Admission: RE | Admit: 2015-04-05 | Discharge: 2015-04-05 | Disposition: A | Discharge status: Home / Self Care | Payer: Medicare Other | Source: Ambulatory Visit | Attending: Family Medicine | Admitting: Family Medicine

## 2015-04-05 ENCOUNTER — Other Ambulatory Visit: Payer: Self-pay | Admitting: Family Medicine

## 2015-04-05 DIAGNOSIS — A048 Other specified bacterial intestinal infections: Secondary | ICD-10-CM

## 2015-04-05 DIAGNOSIS — R1012 Left upper quadrant pain: Secondary | ICD-10-CM | POA: Insufficient documentation

## 2015-04-05 DIAGNOSIS — R110 Nausea: Secondary | ICD-10-CM | POA: Insufficient documentation

## 2015-04-05 DIAGNOSIS — R140 Abdominal distension (gaseous): Secondary | ICD-10-CM | POA: Diagnosis present

## 2015-04-05 MED ORDER — BIS SUBCIT-METRONID-TETRACYC 140-125-125 MG PO CAPS: 3.0000 | ORAL_CAPSULE | Freq: Three times a day (TID) | ORAL | Status: DC

## 2015-04-14 ENCOUNTER — Ambulatory Visit (INDEPENDENT_AMBULATORY_CARE_PROVIDER_SITE_OTHER): Payer: Medicare Other | Admitting: Family Medicine

## 2015-04-14 ENCOUNTER — Encounter: Payer: Self-pay | Admitting: Family Medicine

## 2015-04-14 VITALS — BP 118/64 | HR 99 | Temp 98.3°F | Resp 18 | Ht 61.0 in | Wt 179.1 lb

## 2015-04-14 DIAGNOSIS — J209 Acute bronchitis, unspecified: Secondary | ICD-10-CM

## 2015-04-14 DIAGNOSIS — IMO0001 Reserved for inherently not codable concepts without codable children: Secondary | ICD-10-CM

## 2015-04-14 DIAGNOSIS — R062 Wheezing: Secondary | ICD-10-CM

## 2015-04-14 DIAGNOSIS — J449 Chronic obstructive pulmonary disease, unspecified: Secondary | ICD-10-CM

## 2015-04-14 DIAGNOSIS — B9681 Helicobacter pylori [H. pylori] as the cause of diseases classified elsewhere: Secondary | ICD-10-CM

## 2015-04-14 DIAGNOSIS — A048 Other specified bacterial intestinal infections: Secondary | ICD-10-CM

## 2015-04-14 MED ORDER — FLUTICASONE FUROATE-VILANTEROL 100-25 MCG/INH IN AEPB: 1.0000 | INHALATION_SPRAY | Freq: Every day | RESPIRATORY_TRACT | Status: DC

## 2015-04-14 MED ORDER — GUAIFENESIN-CODEINE 100-10 MG/5ML PO SOLN: 5.0000 mL | Freq: Three times a day (TID) | ORAL | Status: DC | PRN

## 2015-04-14 NOTE — Progress Notes (Signed)
Name: Julie Wall   MRN: [id number]    DOB: 05/29/1956   Date:04/14/2015       Progress Note  Subjective  Chief Complaint  Chief Complaint  Patient presents with  . Follow-up    abdominal pain  . Abdominal Pain    flank pain onset 2 months, unchanged worse when she lays down    HPI  COPD bronchitis: she continues to smoke - but is cutting down , only smoked 3 cigarettes per day since last visit, over the past month  has noticed daily wheezing, dry cough initially but now is productive ( yellow sputum ) No fever, no chills. SOB has improved with Breo Elipita. CBC and CXR done on her last visit reviewed today  Left Upper Quadrant pain: she has been having recurrent symptoms over the past couple of months. Pain is on left flank or left upper quadrant, feels a ball that grows inside of her. Worse at night when trying to fall asleep and has to toss and turn to get in a comfortable position, she also has nausea ( watery mouth ), no vomiting, no change in bowel movement, no dysuria, no hematuria. Sometimes bloating. Labs and US were done since last visit. She was diagnosed with h. Pylori infection and has started therapy, she has not noticed improvement of symptoms yet.   Patient Active Problem List   Diagnosis Date Noted  . Positive H. pylori test 04/05/2015  . Umbilical hernia 04/01/2015  . RLS (restless legs syndrome) 01/08/2015  . Type 2 diabetes mellitus with other diabetic kidney complication (HCC) 01/08/2015  . Neurogenic claudication 01/08/2015  . AD (atopic dermatitis) 12/28/2014  . Chronic LBP 12/28/2014  . Dyslipidemia 12/28/2014  . Gastro-esophageal reflux disease without esophagitis 12/28/2014  . Microalbuminuria 12/28/2014  . Disorder of bone and cartilage 12/28/2014  . Obesity (BMI 30-39.9) 12/28/2014  . Neuralgia of left thigh 12/28/2014  . Perennial allergic rhinitis with seasonal variation 12/28/2014  . Tobacco abuse 12/28/2014    Past Surgical History  Procedure  Laterality Date  . Cesarean section    . Cataract extraction Bilateral 1980  . Corneal transplant Left     [facility]-Treatment for blindness.    Family History  Problem Relation Age of Onset  . Diabetes Mother   . Hypertension Mother   . Alcohol abuse Father     Social History   Social History  . Marital Status: Single    Spouse Name: N/A  . Number of Children: N/A  . Years of Education: N/A   Occupational History  . Not on file.   Social History Main Topics  . Smoking status: Current Every Day Smoker -- 0.75 packs/day for 30 years    Types: Cigarettes    Start date: 01/07/1985  . Smokeless tobacco: Never Used  . Alcohol Use: No  . Drug Use: No  . Sexual Activity: No   Other Topics Concern  . Not on file   Social History Narrative     Current outpatient prescriptions:  .  aspirin 81 MG chewable tablet, Chew 1 tablet by mouth daily., Disp: , Rfl:  .  bismuth-metronidazole-tetracycline (PYLERA) 140-125-125 MG capsule, Take 3 capsules by mouth 4 (four) times daily -  before meals and at bedtime., Disp: 120 capsule, Rfl: 0 .  cyclobenzaprine (FLEXERIL) 5 MG tablet, Take 1 tablet by mouth at bedtime., Disp: , Rfl:  .  fluticasone (FLONASE) 50 MCG/ACT nasal spray, Place 2 sprays into the nose at bedtime., Disp: , Rfl:  .  Fluticasone Furoate-Vilanterol (BREO ELLIPTA) 100-25 MCG/INH AEPB, Inhale 1 puff into the lungs daily., Disp: 28 each, Rfl: 0 .  gabapentin (NEURONTIN) 300 MG capsule, Take 1 capsule (300 mg total) by mouth 3 (three) times daily., Disp: 90 capsule, Rfl: 5 .  HYDROcodone-acetaminophen (NORCO/VICODIN) 5-325 MG per tablet, Take 2 tablets by mouth 2 (two) times daily., Disp: 60 tablet, Rfl: 0 .  Ipratropium-Albuterol (COMBIVENT RESPIMAT) 20-100 MCG/ACT AERS respimat, Inhale 1 puff into the lungs 4 (four) times daily., Disp: 1 Inhaler, Rfl: 2 .  lisinopril (PRINIVIL,ZESTRIL) 5 MG tablet, Take 1 tablet (5 mg total) by mouth daily., Disp: 30 tablet, Rfl: 5 .   meloxicam (MOBIC) 7.5 MG tablet, Take 1 tablet by mouth daily., Disp: , Rfl:  .  METHOTREXATE, ANTI-RHEUMATIC, PO, Take 3 tablets by mouth once a week., Disp: , Rfl:  .  omeprazole (PRILOSEC) 20 MG capsule, Take 1 capsule by mouth every morning., Disp: , Rfl:  .  rOPINIRole (REQUIP) 1 MG tablet, Take 1 tablet (1 mg total) by mouth daily., Disp: 30 tablet, Rfl: 5 .  simvastatin (ZOCOR) 10 MG tablet, Take 1 tablet (10 mg total) by mouth daily., Disp: 30 tablet, Rfl: 5 .  sitaGLIPtin-metformin (JANUMET) 50-1000 MG per tablet, Take 1 tablet by mouth 2 (two) times daily., Disp: 60 tablet, Rfl: 5 .  triamcinolone cream (KENALOG) 0.1 %, TRIAMCINOLONE ACETONIDE, 0.1% (External Cream)  use per dermatologist. for 0 days  Quantity: 1.00;  Refills: 0   Ordered :15-Jun-2010  Alba Cory MD;  Started 02-Apr-2008 Active Comments: DX: 724.2, Disp: , Rfl:   Allergies  Allergen Reactions  . Penicillins      ROS  Constitutional: Negative for fever , positive for  weight change.  Respiratory: Positive  for cough and shortness of breath.   Cardiovascular: Negative for chest pain ( sore from coughing only ) or palpitations.  Gastrointestinal: Negative for abdominal pain, no bowel changes.  Musculoskeletal: Negative for gait problem or joint swelling.  Skin: Eczema Neurological: Negative for dizziness or headache.  No other specific complaints in a complete review of systems (except as listed in HPI above).  Objective  Filed Vitals:   04/14/15 1014  BP: 118/64  Pulse: 99  Temp: 98.3 F (36.8 C)  TempSrc: Oral  Resp: 18  Height: 5\' 1"  (1.549 m)  Weight: 179 lb 1.6 oz (81.239 kg)  SpO2: 94%    Body mass index is 33.86 kg/(m^2).  Physical Exam  Constitutional: Patient appears well-developed and well-nourished. Obese No distress.  HEENT: head atraumatic, normocephalic, pupils equal and reactive to light, neck supple, throat within normal limits Cardiovascular: Normal rate, regular rhythm and  normal heart sounds. No murmur heard. No BLE edema. Pulmonary/Chest: Effort normal and, scattered rhonchi and wheezing . No respiratory distress. Abdominal: Soft. There is no tenderness. Reducible umbilical hernia, negative CVA tenderness, no abdominal pain during exam.  Psychiatric: Patient has a normal mood and affect. behavior is normal. Judgment and thought  Recent Results (from the past 2160 hour(s))  CBC with Differential/Platelet     Status: None   Collection Time: 04/02/15  9:56 AM  Result Value Ref Range   WBC 7.9 3.4 - 10.8 x10E3/uL   RBC 4.33 3.77 - 5.28 x10E6/uL   Hemoglobin 13.8 11.1 - 15.9 g/dL   Hematocrit 40.9 81.1 - 46.6 %   MCV 92 79 - 97 fL   MCH 31.9 26.6 - 33.0 pg   MCHC 34.8 31.5 - 35.7 g/dL   RDW 91.4 78.2 -  15.4 %   Platelets 261 150 - 379 x10E3/uL   Neutrophils 69 %   Lymphs 22 %   Monocytes 4 %   Eos 5 %   Basos 0 %   Neutrophils Absolute 5.5 1.4 - 7.0 x10E3/uL   Lymphocytes Absolute 1.7 0.7 - 3.1 x10E3/uL   Monocytes Absolute 0.3 0.1 - 0.9 x10E3/uL   EOS (ABSOLUTE) 0.4 0.0 - 0.4 x10E3/uL   Basophils Absolute 0.0 0.0 - 0.2 x10E3/uL   Immature Granulocytes 0 %   Immature Grans (Abs) 0.0 0.0 - 0.1 x10E3/uL  Comprehensive metabolic panel     Status: Abnormal   Collection Time: 04/02/15  9:56 AM  Result Value Ref Range   Glucose 94 65 - 99 mg/dL   BUN 12 8 - 27 mg/dL   Creatinine, Ser 1.61 (L) 0.57 - 1.00 mg/dL   GFR calc non Af Amer 102 >59 mL/min/1.73   GFR calc Af Amer 118 >59 mL/min/1.73   BUN/Creatinine Ratio 22 11 - 26   Sodium 140 136 - 144 mmol/L    Comment:               **Please note reference interval change**   Potassium 4.1 3.5 - 5.2 mmol/L    Comment:               **Please note reference interval change**   Chloride 105 97 - 106 mmol/L    Comment:               **Please note reference interval change**   CO2 21 18 - 29 mmol/L   Calcium 9.2 8.7 - 10.3 mg/dL   Total Protein 6.8 6.0 - 8.5 g/dL   Albumin 4.0 3.6 - 4.8 g/dL    Globulin, Total 2.8 1.5 - 4.5 g/dL   Albumin/Globulin Ratio 1.4 1.1 - 2.5   Bilirubin Total 0.2 0.0 - 1.2 mg/dL   Alkaline Phosphatase 80 39 - 117 IU/L   AST 11 0 - 40 IU/L   ALT 11 0 - 32 IU/L  Lipid panel     Status: None   Collection Time: 04/02/15  9:56 AM  Result Value Ref Range   Cholesterol, Total 119 100 - 199 mg/dL   Triglycerides 73 0 - 149 mg/dL   HDL 49 >09 mg/dL    Comment: According to ATP-III Guidelines, HDL-C >59 mg/dL is considered a negative risk factor for CHD.    VLDL Cholesterol Cal 15 5 - 40 mg/dL   LDL Calculated 55 0 - 99 mg/dL   Chol/HDL Ratio 2.4 0.0 - 4.4 ratio units    Comment:                                   T. Chol/HDL Ratio                                             Men  Women                               1/2 Avg.Risk  3.4    3.3  Avg.Risk  5.0    4.4                                2X Avg.Risk  9.6    7.1                                3X Avg.Risk 23.4   11.0   Lipase     Status: None   Collection Time: 04/02/15  9:56 AM  Result Value Ref Range   Lipase 56 0 - 59 U/L  H. pylori antibody, IgG     Status: Abnormal   Collection Time: 04/02/15  9:56 AM  Result Value Ref Range   H Pylori IgG 5.5 (H) 0.0 - 0.8 U/mL    Comment:                              Negative            <0.9                              Indeterminate  0.9 - 1.0                              Positive            >1.0     PHQ2/9: Depression screen [facility] 2/9 04/01/2015 01/08/2015  Decreased Interest 0 0  Down, Depressed, Hopeless 0 0  PHQ - 2 Score 0 0     Fall Risk: Fall Risk  04/01/2015 01/08/2015  Falls in the past year? No No     Assessment & Plan  1. Positive H. pylori test  Continue medication, if no improvement on side pain call back  2. COPD with bronchitis  Needs to continue Breo, taking Pylera and we should try to wait to try another antibiotics for breathing, also discussed steroids but she wants to hold off still coughing  a lot, wants a cough suppressant. Continue weaning self of cigarettes so she can quit  - guaiFENesin-codeine 100-10 MG/5ML syrup; Take 5-10 mLs by mouth 3 (three) times daily as needed for cough.  Dispense: 240 mL; Refill: 0 - Fluticasone Furoate-Vilanterol (BREO ELLIPTA) 100-25 MCG/INH AEPB; Inhale 1 puff into the lungs daily.  Dispense: 28 each; Refill: 0

## 2015-04-15 ENCOUNTER — Ambulatory Visit: Payer: Medicare Other | Admitting: Family Medicine

## 2015-04-16 ENCOUNTER — Telehealth: Payer: Self-pay

## 2015-04-16 NOTE — Telephone Encounter (Signed)
Patient states she has started the new medication-Pylera for her H. Pylori 2 days ago. Since then every time she wipes after using the bathroom she is having extreme itching in her vaginal regions, and wanted to know could you call her in something for it. Best contact number is [phone].

## 2015-04-18 ENCOUNTER — Other Ambulatory Visit: Payer: Self-pay | Admitting: Family Medicine

## 2015-04-18 MED ORDER — FLUCONAZOLE 150 MG PO TABS: 150.0000 mg | ORAL_TABLET | ORAL | Status: DC

## 2015-04-18 NOTE — Telephone Encounter (Signed)
Sent diflucan

## 2015-04-29 ENCOUNTER — Encounter: Payer: Self-pay | Admitting: Emergency Medicine

## 2015-04-29 ENCOUNTER — Emergency Department
Admission: EM | Admit: 2015-04-29 | Discharge: 2015-04-29 | Disposition: A | Discharge status: Home / Self Care | Payer: Medicare Other | Attending: Emergency Medicine | Admitting: Emergency Medicine

## 2015-04-29 DIAGNOSIS — Z791 Long term (current) use of non-steroidal anti-inflammatories (NSAID): Secondary | ICD-10-CM | POA: Insufficient documentation

## 2015-04-29 DIAGNOSIS — I10 Essential (primary) hypertension: Secondary | ICD-10-CM | POA: Insufficient documentation

## 2015-04-29 DIAGNOSIS — S61212A Laceration without foreign body of right middle finger without damage to nail, initial encounter: Secondary | ICD-10-CM | POA: Insufficient documentation

## 2015-04-29 DIAGNOSIS — Z880 Allergy status to penicillin: Secondary | ICD-10-CM | POA: Diagnosis not present

## 2015-04-29 DIAGNOSIS — S61219A Laceration without foreign body of unspecified finger without damage to nail, initial encounter: Secondary | ICD-10-CM

## 2015-04-29 DIAGNOSIS — Y998 Other external cause status: Secondary | ICD-10-CM | POA: Insufficient documentation

## 2015-04-29 DIAGNOSIS — E119 Type 2 diabetes mellitus without complications: Secondary | ICD-10-CM | POA: Diagnosis not present

## 2015-04-29 DIAGNOSIS — Y288XXA Contact with other sharp object, undetermined intent, initial encounter: Secondary | ICD-10-CM | POA: Insufficient documentation

## 2015-04-29 DIAGNOSIS — Z7982 Long term (current) use of aspirin: Secondary | ICD-10-CM | POA: Diagnosis not present

## 2015-04-29 DIAGNOSIS — Y9289 Other specified places as the place of occurrence of the external cause: Secondary | ICD-10-CM | POA: Insufficient documentation

## 2015-04-29 DIAGNOSIS — F17210 Nicotine dependence, cigarettes, uncomplicated: Secondary | ICD-10-CM | POA: Insufficient documentation

## 2015-04-29 DIAGNOSIS — Z79899 Other long term (current) drug therapy: Secondary | ICD-10-CM | POA: Diagnosis not present

## 2015-04-29 DIAGNOSIS — Y9389 Activity, other specified: Secondary | ICD-10-CM | POA: Diagnosis not present

## 2015-04-29 MED ORDER — LIDOCAINE HCL (PF) 1 % IJ SOLN
10.0000 mL | Freq: Once | INTRAMUSCULAR | Status: AC
  Administered 2015-04-29: 10 mL
  Filled 2015-04-29: qty 10

## 2015-04-29 NOTE — ED Provider Notes (Signed)
[facility] Emergency Department Provider Note  ____________________________________________  Time seen: Approximately 8:02 PM  I have reviewed the triage vital signs and the nursing notes.   HISTORY  Chief Complaint Laceration    HPI Julie Wall is a 59 y.o. female who presents to emergency department complaining of a laceration to the third digit of the right hand. She states that she was playing with her nephew when her hand was cut on a brick wall. She states that she still has sensation to the distal aspect of her finger. Full range of motion to her finger. She states her last tetanus was within 5 years. Bleeding was controlled prior to arrival. She states the pain is minimal at this time.   Past Medical History  Diagnosis Date  . Dental caries   . Allergy   . Atopic dermatitis     Dermatologist at [facility]  . Osteopenia   . Ovarian failure   . Lumbosacral neuritis   . Diabetes mellitus without complication (HCC)   . Chronic low back pain   . GERD (gastroesophageal reflux disease)   . Decreased exercise tolerance   . Hypertension   . Microalbuminuria   . ASCUS favor benign 09/2013    negative HPV  . Tobacco use     Patient Active Problem List   Diagnosis Date Noted  . Positive H. pylori test 04/05/2015  . Umbilical hernia 04/01/2015  . RLS (restless legs syndrome) 01/08/2015  . Type 2 diabetes mellitus with other diabetic kidney complication (HCC) 01/08/2015  . Neurogenic claudication 01/08/2015  . AD (atopic dermatitis) 12/28/2014  . Chronic LBP 12/28/2014  . Dyslipidemia 12/28/2014  . Gastro-esophageal reflux disease without esophagitis 12/28/2014  . Microalbuminuria 12/28/2014  . Disorder of bone and cartilage 12/28/2014  . Obesity (BMI 30-39.9) 12/28/2014  . Neuralgia of left thigh 12/28/2014  . Perennial allergic rhinitis with seasonal variation 12/28/2014  . Tobacco abuse 12/28/2014    Past Surgical History  Procedure Laterality  Date  . Cesarean section    . Cataract extraction Bilateral 1980  . Corneal transplant Left     [facility]-Treatment for blindness.    Current Outpatient Rx  Name  Route  Sig  Dispense  Refill  . aspirin 81 MG chewable tablet   Oral   Chew 1 tablet by mouth daily.         Marland Kitchen bismuth-metronidazole-tetracycline (PYLERA) 140-125-125 MG capsule   Oral   Take 3 capsules by mouth 4 (four) times daily -  before meals and at bedtime.   120 capsule   0   . cyclobenzaprine (FLEXERIL) 5 MG tablet   Oral   Take 1 tablet by mouth at bedtime.         . fluconazole (DIFLUCAN) 150 MG tablet   Oral   Take 1 tablet (150 mg total) by mouth every other day.   3 tablet   0   . fluticasone (FLONASE) 50 MCG/ACT nasal spray   Nasal   Place 2 sprays into the nose at bedtime.         . Fluticasone Furoate-Vilanterol (BREO ELLIPTA) 100-25 MCG/INH AEPB   Inhalation   Inhale 1 puff into the lungs daily.   28 each   0   . gabapentin (NEURONTIN) 300 MG capsule   Oral   Take 1 capsule (300 mg total) by mouth 3 (three) times daily.   90 capsule   5   . guaiFENesin-codeine 100-10 MG/5ML syrup   Oral  Take 5-10 mLs by mouth 3 (three) times daily as needed for cough.   240 mL   0   . HYDROcodone-acetaminophen (NORCO/VICODIN) 5-325 MG per tablet   Oral   Take 2 tablets by mouth 2 (two) times daily.   60 tablet   0   . Ipratropium-Albuterol (COMBIVENT RESPIMAT) 20-100 MCG/ACT AERS respimat   Inhalation   Inhale 1 puff into the lungs 4 (four) times daily.   1 Inhaler   2   . lisinopril (PRINIVIL,ZESTRIL) 5 MG tablet   Oral   Take 1 tablet (5 mg total) by mouth daily.   30 tablet   5   . meloxicam (MOBIC) 7.5 MG tablet   Oral   Take 1 tablet by mouth daily.         Marland Kitchen. METHOTREXATE, ANTI-RHEUMATIC, PO   Oral   Take 3 tablets by mouth once a week.         Marland Kitchen. omeprazole (PRILOSEC) 20 MG capsule   Oral   Take 1 capsule by mouth every morning.         Marland Kitchen. rOPINIRole (REQUIP) 1  MG tablet   Oral   Take 1 tablet (1 mg total) by mouth daily.   30 tablet   5   . simvastatin (ZOCOR) 10 MG tablet   Oral   Take 1 tablet (10 mg total) by mouth daily.   30 tablet   5   . sitaGLIPtin-metformin (JANUMET) 50-1000 MG per tablet   Oral   Take 1 tablet by mouth 2 (two) times daily.   60 tablet   5   . triamcinolone cream (KENALOG) 0.1 %      TRIAMCINOLONE ACETONIDE, 0.1% (External Cream)  use per dermatologist. for 0 days  Quantity: 1.00;  Refills: 0   Ordered :15-Jun-2010  Alba CorySOWLES, KRICHNA MD;  Mora ApplStarted 02-Apr-2008 Active Comments: DX: 724.2           Allergies Penicillins  Family History  Problem Relation Age of Onset  . Diabetes Mother   . Hypertension Mother   . Alcohol abuse Father     Social History Social History  Substance Use Topics  . Smoking status: Current Every Day Smoker -- 0.75 packs/day for 30 years    Types: Cigarettes    Start date: 01/07/1985  . Smokeless tobacco: Never Used  . Alcohol Use: No    Review of Systems Constitutional: No fever/chills Eyes: No visual changes. ENT: No sore throat. Cardiovascular: Denies chest pain. Respiratory: Denies shortness of breath. Gastrointestinal: No abdominal pain.  No nausea, no vomiting.  No diarrhea.  No constipation. Genitourinary: Negative for dysuria. Musculoskeletal: Negative for back pain. Skin: Negative for rash. Laceration to the third digit right hand. Neurological: Negative for headaches, focal weakness or numbness.  10-point ROS otherwise negative.  ____________________________________________   PHYSICAL EXAM:  VITAL SIGNS: ED Triage Vitals  Enc Vitals Group     BP 04/29/15 1948 140/72 mmHg     Pulse Rate 04/29/15 1948 113     Resp 04/29/15 1948 20     Temp 04/29/15 1948 98.2 F (36.8 C)     Temp Source 04/29/15 1948 Oral     SpO2 04/29/15 1948 92 %     Weight 04/29/15 1948 174 lb (78.926 kg)     Height 04/29/15 1948 4\' 9"  (1.448 m)     Head Cir --       Peak Flow --      Pain Score 04/29/15 1949 1  Pain Loc --      Pain Edu? --      Excl. in GC? --     Constitutional: Alert and oriented. Well appearing and in no acute distress. Eyes: Conjunctivae are normal. PERRL. EOMI. Head: Atraumatic. Nose: No congestion/rhinnorhea. Mouth/Throat: Mucous membranes are moist.  Oropharynx non-erythematous. Neck: No stridor.   Cardiovascular: Normal rate, regular rhythm. Grossly normal heart sounds.  Good peripheral circulation. Respiratory: Normal respiratory effort.  No retractions. Lungs CTAB. Gastrointestinal: Soft and nontender. No distention. No abdominal bruits. No CVA tenderness. Musculoskeletal: No lower extremity tenderness nor edema.  No joint effusions. Neurologic:  Normal speech and language. No gross focal neurologic deficits are appreciated. No gait instability. Skin:  Skin is warm, dry and intact. No rash noted. Patient has a 5 cm laceration to the palmar aspect of third digit right hand. Bleeding is controlled. No visible foreign bodies. Laceration is a semicircular distribution with no flaps. Sensation and cap refill are present distally. Psychiatric: Mood and affect are normal. Speech and behavior are normal.  ____________________________________________   LABS (all labs ordered are listed, but only abnormal results are displayed)  Labs Reviewed - No data to display ____________________________________________  EKG   ____________________________________________  RADIOLOGY    ____________________________________________   PROCEDURES  Procedure(s) performed: Yes, laceration repair, see procedure note(s).   LACERATION REPAIR Performed by: Racheal Patches Authorized by: Delorise Royals Marvell Tamer Consent: Verbal consent obtained. Risks and benefits: risks, benefits and alternatives were discussed Consent given by: patient Patient identity confirmed: provided demographic data Prepped and Draped in normal sterile  fashion Wound explored  Laceration Location: Third digit right hand  Laceration Length: 5 cm  No Foreign Bodies seen or palpated  Anesthesia: Digital block   Local anesthetic: lidocaine 1 % without epinephrine  Anesthetic total: 8 ml  Irrigation method: syringe Amount of cleaning: standard  Skin closure: 4-0 Ethilon sutures   Number of sutures: 10   Technique: Simple interrupted   Patient tolerance: Patient tolerated the procedure well with no immediate complications.   Critical Care performed: No  ____________________________________________   INITIAL IMPRESSION / ASSESSMENT AND PLAN / ED COURSE  Pertinent labs & imaging results that were available during my care of the patient were reviewed by me and considered in my medical decision making (see chart for details).  Patient presented with a laceration to the third digit of her right hand. Patient is current on her tetanus. Wound is closed primarily using 4-0 Ethilon sutures. 10 sutures are placed. Patient tolerated procedure well. Patient is to follow-up with primary care or urgent care for suture removal in a week. Patient verbalizes understanding of same. Patient may take Tylenol and ibuprofen at home for additional symptom control. ____________________________________________   FINAL CLINICAL IMPRESSION(S) / ED DIAGNOSES  Final diagnoses:  Finger laceration, initial encounter      Racheal Patches, PA-C 04/29/15 2114  Jennye Moccasin, MD 04/30/15 [phone]

## 2015-04-29 NOTE — Discharge Instructions (Signed)
Laceration Care, Adult °A laceration is a cut that goes through all of the layers of the skin and into the tissue that is right under the skin. Some lacerations heal on their own. Others need to be closed with stitches (sutures), staples, skin adhesive strips, or skin glue. Proper laceration care minimizes the risk of infection and helps the laceration to heal better. °HOW TO CARE FOR YOUR LACERATION °If sutures or staples were used: °· Keep the wound clean and dry. °· If you were given a bandage (dressing), you should change it at least one time per day or as told by your health care provider. You should also change it if it becomes wet or dirty. °· Keep the wound completely dry for the first 24 hours or as told by your health care provider. After that time, you may shower or bathe. However, make sure that the wound is not soaked in water until after the sutures or staples have been removed. °· Clean the wound one time each day or as told by your health care provider: °· Wash the wound with soap and water. °· Rinse the wound with water to remove all soap. °· Pat the wound dry with a clean towel. Do not rub the wound. °· After cleaning the wound, apply a thin layer of antibiotic ointment as told by your health care provider. This will help to prevent infection and keep the dressing from sticking to the wound. °· Have the sutures or staples removed as told by your health care provider. °If skin adhesive strips were used: °· Keep the wound clean and dry. °· If you were given a bandage (dressing), you should change it at least one time per day or as told by your health care provider. You should also change it if it becomes dirty or wet. °· Do not get the skin adhesive strips wet. You may shower or bathe, but be careful to keep the wound dry. °· If the wound gets wet, pat it dry with a clean towel. Do not rub the wound. °· Skin adhesive strips fall off on their own. You may trim the strips as the wound heals. Do not  remove skin adhesive strips that are still stuck to the wound. They will fall off in time. °If skin glue was used: °· Try to keep the wound dry, but you may briefly wet it in the shower or bath. Do not soak the wound in water, such as by swimming. °· After you have showered or bathed, gently pat the wound dry with a clean towel. Do not rub the wound. °· Do not do any activities that will make you sweat heavily until the skin glue has fallen off on its own. °· Do not apply liquid, cream, or ointment medicine to the wound while the skin glue is in place. Using those may loosen the film before the wound has healed. °· If you were given a bandage (dressing), you should change it at least one time per day or as told by your health care provider. You should also change it if it becomes dirty or wet. °· If a dressing is placed over the wound, be careful not to apply tape directly over the skin glue. Doing that may cause the glue to be pulled off before the wound has healed. °· Do not pick at the glue. The skin glue usually remains in place for 5-10 days, then it falls off of the skin. °General Instructions °· Take over-the-counter and prescription   medicines only as told by your health care provider. °· If you were prescribed an antibiotic medicine or ointment, take or apply it as told by your doctor. Do not stop using it even if your condition improves. °· To help prevent scarring, make sure to cover your wound with sunscreen whenever you are outside after stitches are removed, after adhesive strips are removed, or when glue remains in place and the wound is healed. Make sure to wear a sunscreen of at least 30 SPF. °· Do not scratch or pick at the wound. °· Keep all follow-up visits as told by your health care provider. This is important. °· Check your wound every day for signs of infection. Watch for: °· Redness, swelling, or pain. °· Fluid, blood, or pus. °· Raise (elevate) the injured area above the level of your heart  while you are sitting or lying down, if possible. °SEEK MEDICAL CARE IF: °· You received a tetanus shot and you have swelling, severe pain, redness, or bleeding at the injection site. °· You have a fever. °· A wound that was closed breaks open. °· You notice a bad smell coming from your wound or your dressing. °· You notice something coming out of the wound, such as wood or glass. °· Your pain is not controlled with medicine. °· You have increased redness, swelling, or pain at the site of your wound. °· You have fluid, blood, or pus coming from your wound. °· You notice a change in the color of your skin near your wound. °· You need to change the dressing frequently due to fluid, blood, or pus draining from the wound. °· You develop a new rash. °· You develop numbness around the wound. °SEEK IMMEDIATE MEDICAL CARE IF: °· You develop severe swelling around the wound. °· Your pain suddenly increases and is severe. °· You develop painful lumps near the wound or on skin that is anywhere on your body. °· You have a red streak going away from your wound. °· The wound is on your hand or foot and you cannot properly move a finger or toe. °· The wound is on your hand or foot and you notice that your fingers or toes look pale or bluish. °  °This information is not intended to replace advice given to you by your health care provider. Make sure you discuss any questions you have with your health care provider. °  °Document Released: 05/22/2005 Document Revised: 10/06/2014 Document Reviewed: 05/18/2014 °Elsevier Interactive Patient Education ©2016 [employer] ° °Stitches, Staples, or Adhesive Wound Closure °Health care providers use stitches (sutures), staples, and certain glue (skin adhesives) to hold skin together while it heals (wound closure). You may need this treatment after you have surgery or if you cut your skin accidentally. These methods help your skin to heal more quickly and make it less likely that you will have  a scar. A wound may take several months to heal completely. °The type of wound you have determines when your wound gets closed. In most cases, the wound is closed as soon as possible (primary skin closure). Sometimes, closure is delayed so the wound can be cleaned and allowed to heal naturally. This reduces the chance of infection. Delayed closure may be needed if your wound: °· Is caused by a bite. °· Happened more than 6 hours ago. °· Involves loss of skin or the tissues under the skin. °· Has dirt or debris in it that cannot be removed. °· Is infected. °WHAT   ARE THE DIFFERENT KINDS OF WOUND CLOSURES? °There are many options for wound closure. The one that your health care provider uses depends on how deep and how large your wound is. °Adhesive Glue °To use this type of glue to close a wound, your health care provider holds the edges of the wound together and paints the glue on the surface of your skin. You may need more than one layer of glue. Then the wound may be covered with a light bandage (dressing). °This type of skin closure may be used for small wounds that are not deep (superficial). Using glue for wound closure is less painful than other methods. It does not require a medicine that numbs the area (local anesthetic). This method also leaves nothing to be removed. Adhesive glue is often used for children and on facial wounds. °Adhesive glue cannot be used for wounds that are deep, uneven, or bleeding. It is not used inside of a wound.  °Adhesive Strips °These strips are made of sticky (adhesive), porous paper. They are applied across your skin edges like a regular adhesive bandage. You leave them on until they fall off. °Adhesive strips may be used to close very superficial wounds. They may also be used along with sutures to improve the closure of your skin edges.  °Sutures °Sutures are the oldest method of wound closure. Sutures can be made from natural substances, such as silk, or from synthetic  materials, such as nylon and steel. They can be made from a material that your body can break down as your wound heals (absorbable), or they can be made from a material that needs to be removed from your skin (nonabsorbable). They come in many different strengths and sizes. °Your health care provider attaches the sutures to a steel needle on one end. Sutures can be passed through your skin, or through the tissues beneath your skin. Then they are tied and cut. Your skin edges may be closed in one continuous stitch or in separate stitches. °Sutures are strong and can be used for all kinds of wounds. Absorbable sutures may be used to close tissues under the skin. The disadvantage of sutures is that they may cause skin reactions that lead to infection. Nonabsorbable sutures need to be removed. °Staples °When surgical staples are used to close a wound, the edges of your skin on both sides of the wound are brought close together. A staple is placed across the wound, and an instrument secures the edges together. Staples are often used to close surgical cuts (incisions). °Staples are faster to use than sutures, and they cause less skin reaction. Staples need to be removed using a tool that bends the staples away from your skin. °HOW DO I CARE FOR MY WOUND CLOSURE? °· Take medicines only as directed by your health care provider. °· If you were prescribed an antibiotic medicine for your wound, finish it all even if you start to feel better. °· Use ointments or creams only as directed by your health care provider. °· Wash your hands with soap and water before and after touching your wound. °· Do not soak your wound in water. Do not take baths, swim, or use a hot tub until your health care provider approves. °· Ask your health care provider when you can start showering. Cover your wound if directed by your health care provider. °· Do not take out your own sutures or staples. °· Do not pick at your wound. Picking can cause an  infection. °·   Keep all follow-up visits as directed by your health care provider. This is important. °HOW Duff WILL I HAVE MY WOUND CLOSURE? °· Leave adhesive glue on your skin until the glue peels away. °· Leave adhesive strips on your skin until the strips fall off. °· Absorbable sutures will dissolve within several days. °· Nonabsorbable sutures and staples must be removed. The location of the wound will determine how Green[place] they stay in. This can range from several days to a couple of weeks. °WHEN SHOULD I SEEK HELP FOR MY WOUND CLOSURE? °Contact your health care provider if: °· You have a fever. °· You have chills. °· You have drainage, redness, swelling, or pain at your wound. °· There is a bad smell coming from your wound. °· The skin edges of your wound start to separate after your sutures have been removed. °· Your wound becomes thick, raised, and darker in color after your sutures come out (scarring). °  °This information is not intended to replace advice given to you by your health care provider. Make sure you discuss any questions you have with your health care provider. °  °Document Released: 02/14/2001 Document Revised: 06/12/2014 Document Reviewed: 10/29/2013 °Elsevier Interactive Patient Education ©2016 [employer] ° °

## 2015-04-29 NOTE — ED Notes (Signed)
Pt arrived to the ED for a laceration to the 3rd digit of the right hand. Pt is AOx4 in no apparent distress, no bleeding at this time.

## 2015-05-06 ENCOUNTER — Ambulatory Visit (INDEPENDENT_AMBULATORY_CARE_PROVIDER_SITE_OTHER): Payer: Medicare Other | Admitting: Family Medicine

## 2015-05-06 ENCOUNTER — Encounter: Payer: Self-pay | Admitting: Family Medicine

## 2015-05-06 VITALS — BP 122/68 | HR 92 | Temp 98.1°F | Resp 16 | Wt 178.7 lb

## 2015-05-06 DIAGNOSIS — S61219D Laceration without foreign body of unspecified finger without damage to nail, subsequent encounter: Secondary | ICD-10-CM

## 2015-05-06 MED ORDER — LIDOCAINE HCL (PF) 1 % IJ SOLN: 2.0000 mL | Freq: Once | INTRAMUSCULAR | Status: DC

## 2015-05-06 NOTE — Progress Notes (Signed)
Name: Julie Wall   MRN: [id number]    DOB: Aug 26, 1955   Date:05/06/2015       Progress Note  Subjective  Chief Complaint  Chief Complaint  Patient presents with  . Suture / Staple Removal    patient is here following 5cm laceration on 04/29/15. patient was seen at [facility] and received 10 sutures. last Tdap was on 01/25/2012.    HPI  Suture removal: patient had a v shape laceration on right middle finger, ventral aspect on 04/29/2015. Tdap is up to date. She still has some tenderness and edema but feeling better. Laceration repair done at [facility]  Patient Active Problem List   Diagnosis Date Noted  . Positive H. pylori test 04/05/2015  . Umbilical hernia 04/01/2015  . RLS (restless legs syndrome) 01/08/2015  . Type 2 diabetes mellitus with other diabetic kidney complication (HCC) 01/08/2015  . Neurogenic claudication 01/08/2015  . AD (atopic dermatitis) 12/28/2014  . Chronic LBP 12/28/2014  . Dyslipidemia 12/28/2014  . Gastro-esophageal reflux disease without esophagitis 12/28/2014  . Microalbuminuria 12/28/2014  . Disorder of bone and cartilage 12/28/2014  . Obesity (BMI 30-39.9) 12/28/2014  . Neuralgia of left thigh 12/28/2014  . Perennial allergic rhinitis with seasonal variation 12/28/2014  . Tobacco abuse 12/28/2014    Past Surgical History  Procedure Laterality Date  . Cesarean section    . Cataract extraction Bilateral 1980  . Corneal transplant Left     [facility]-Treatment for blindness.    Family History  Problem Relation Age of Onset  . Diabetes Mother   . Hypertension Mother   . Alcohol abuse Father     Social History   Social History  . Marital Status: Single    Spouse Name: N/A  . Number of Children: N/A  . Years of Education: N/A   Occupational History  . Not on file.   Social History Main Topics  . Smoking status: Current Every Day Smoker -- 0.75 packs/day for 30 years    Types: Cigarettes    Start date: 01/07/1985  . Smokeless tobacco: Never Used   . Alcohol Use: No  . Drug Use: No  . Sexual Activity: No   Other Topics Concern  . Not on file   Social History Narrative     Current outpatient prescriptions:  .  aspirin 81 MG chewable tablet, Chew 1 tablet by mouth daily., Disp: , Rfl:  .  cyclobenzaprine (FLEXERIL) 5 MG tablet, Take 1 tablet by mouth at bedtime., Disp: , Rfl:  .  fluconazole (DIFLUCAN) 150 MG tablet, Take 1 tablet (150 mg total) by mouth every other day., Disp: 3 tablet, Rfl: 0 .  Fluticasone Furoate-Vilanterol (BREO ELLIPTA) 100-25 MCG/INH AEPB, Inhale 1 puff into the lungs daily., Disp: 28 each, Rfl: 0 .  gabapentin (NEURONTIN) 300 MG capsule, Take 1 capsule (300 mg total) by mouth 3 (three) times daily., Disp: 90 capsule, Rfl: 5 .  guaiFENesin-codeine 100-10 MG/5ML syrup, Take 5-10 mLs by mouth 3 (three) times daily as needed for cough., Disp: 240 mL, Rfl: 0 .  HYDROcodone-acetaminophen (NORCO/VICODIN) 5-325 MG per tablet, Take 2 tablets by mouth 2 (two) times daily., Disp: 60 tablet, Rfl: 0 .  Ipratropium-Albuterol (COMBIVENT RESPIMAT) 20-100 MCG/ACT AERS respimat, Inhale 1 puff into the lungs 4 (four) times daily., Disp: 1 Inhaler, Rfl: 2 .  lisinopril (PRINIVIL,ZESTRIL) 5 MG tablet, Take 1 tablet (5 mg total) by mouth daily., Disp: 30 tablet, Rfl: 5 .  meloxicam (MOBIC) 7.5 MG tablet, Take 1 tablet by mouth  daily., Disp: , Rfl:  .  METHOTREXATE, ANTI-RHEUMATIC, PO, Take 3 tablets by mouth once a week., Disp: , Rfl:  .  omeprazole (PRILOSEC) 20 MG capsule, Take 1 capsule by mouth every morning., Disp: , Rfl:  .  rOPINIRole (REQUIP) 1 MG tablet, Take 1 tablet (1 mg total) by mouth daily., Disp: 30 tablet, Rfl: 5 .  simvastatin (ZOCOR) 10 MG tablet, Take 1 tablet (10 mg total) by mouth daily., Disp: 30 tablet, Rfl: 5 .  sitaGLIPtin-metformin (JANUMET) 50-1000 MG per tablet, Take 1 tablet by mouth 2 (two) times daily., Disp: 60 tablet, Rfl: 5 .  triamcinolone cream (KENALOG) 0.1 %, TRIAMCINOLONE ACETONIDE, 0.1%  (External Cream)  use per dermatologist. for 0 days  Quantity: 1.00;  Refills: 0   Ordered :15-Jun-2010  Alba Cory MD;  Started 02-Apr-2008 Active Comments: DX: 724.2, Disp: , Rfl:   Allergies  Allergen Reactions  . Penicillins      ROS  Not done  Objective  Filed Vitals:   05/06/15 1037  BP: 122/68  Pulse: 92  Temp: 98.1 F (36.7 C)  TempSrc: Oral  Resp: 16  Weight: 178 lb 11.2 oz (81.058 kg)  SpO2: 93%    Body mass index is 38.66 kg/(m^2).  Physical Exam  Laceration of right middle finger, still very tender to touch and mild swelling, small area of oozing , patient could not tolerate having sutures removed without anesthesia  Recent Results (from the past 2160 hour(s))  CBC with Differential/Platelet     Status: None   Collection Time: 04/02/15  9:56 AM  Result Value Ref Range   WBC 7.9 3.4 - 10.8 x10E3/uL   RBC 4.33 3.77 - 5.28 x10E6/uL   Hemoglobin 13.8 11.1 - 15.9 g/dL   Hematocrit 62.1 30.8 - 46.6 %   MCV 92 79 - 97 fL   MCH 31.9 26.6 - 33.0 pg   MCHC 34.8 31.5 - 35.7 g/dL   RDW 65.7 84.6 - 96.2 %   Platelets 261 150 - 379 x10E3/uL   Neutrophils 69 %   Lymphs 22 %   Monocytes 4 %   Eos 5 %   Basos 0 %   Neutrophils Absolute 5.5 1.4 - 7.0 x10E3/uL   Lymphocytes Absolute 1.7 0.7 - 3.1 x10E3/uL   Monocytes Absolute 0.3 0.1 - 0.9 x10E3/uL   EOS (ABSOLUTE) 0.4 0.0 - 0.4 x10E3/uL   Basophils Absolute 0.0 0.0 - 0.2 x10E3/uL   Immature Granulocytes 0 %   Immature Grans (Abs) 0.0 0.0 - 0.1 x10E3/uL  Comprehensive metabolic panel     Status: Abnormal   Collection Time: 04/02/15  9:56 AM  Result Value Ref Range   Glucose 94 65 - 99 mg/dL   BUN 12 8 - 27 mg/dL   Creatinine, Ser 9.52 (L) 0.57 - 1.00 mg/dL   GFR calc non Af Amer 102 >59 mL/min/1.73   GFR calc Af Amer 118 >59 mL/min/1.73   BUN/Creatinine Ratio 22 11 - 26   Sodium 140 136 - 144 mmol/L    Comment:               **Please note reference interval change**   Potassium 4.1 3.5 - 5.2 mmol/L     Comment:               **Please note reference interval change**   Chloride 105 97 - 106 mmol/L    Comment:               **Please note  reference interval change**   CO2 21 18 - 29 mmol/L   Calcium 9.2 8.7 - 10.3 mg/dL   Total Protein 6.8 6.0 - 8.5 g/dL   Albumin 4.0 3.6 - 4.8 g/dL   Globulin, Total 2.8 1.5 - 4.5 g/dL   Albumin/Globulin Ratio 1.4 1.1 - 2.5   Bilirubin Total 0.2 0.0 - 1.2 mg/dL   Alkaline Phosphatase 80 39 - 117 IU/L   AST 11 0 - 40 IU/L   ALT 11 0 - 32 IU/L  Lipid panel     Status: None   Collection Time: 04/02/15  9:56 AM  Result Value Ref Range   Cholesterol, Total 119 100 - 199 mg/dL   Triglycerides 73 0 - 149 mg/dL   HDL 49 >16>39 mg/dL    Comment: According to ATP-III Guidelines, HDL-C >59 mg/dL is considered a negative risk factor for CHD.    VLDL Cholesterol Cal 15 5 - 40 mg/dL   LDL Calculated 55 0 - 99 mg/dL   Chol/HDL Ratio 2.4 0.0 - 4.4 ratio units    Comment:                                   T. Chol/HDL Ratio                                             Men  Women                               1/2 Avg.Risk  3.4    3.3                                   Avg.Risk  5.0    4.4                                2X Avg.Risk  9.6    7.1                                3X Avg.Risk 23.4   11.0   Lipase     Status: None   Collection Time: 04/02/15  9:56 AM  Result Value Ref Range   Lipase 56 0 - 59 U/L  H. pylori antibody, IgG     Status: Abnormal   Collection Time: 04/02/15  9:56 AM  Result Value Ref Range   H Pylori IgG 5.5 (H) 0.0 - 0.8 U/mL    Comment:                              Negative            <0.9                              Indeterminate  0.9 - 1.0                              Positive            >  1.0     PHQ2/9: Depression screen [facility] 2/9 05/06/2015 04/01/2015 01/08/2015  Decreased Interest 0 0 0  Down, Depressed, Hopeless 0 0 0  PHQ - 2 Score 0 0 0     Fall Risk: Fall Risk  05/06/2015 04/01/2015 01/08/2015  Falls in the past year? No No No      Functional Status Survey: Is the patient deaf or have difficulty hearing?: No Does the patient have difficulty seeing, even when wearing glasses/contacts?: Yes (glasses) Does the patient have difficulty concentrating, remembering, or making decisions?: No Does the patient have difficulty walking or climbing stairs?: No Does the patient have difficulty dressing or bathing?: No Does the patient have difficulty doing errands alone such as visiting a doctor's office or shopping?: No    Assessment & Plan  1. Laceration of finger, subsequent encounter  Area prepped with alcohol, attempted to remove sutures without anesthesia, but patient could not tolerated. We had to give her a digital block ( side effects discussed with patient ), sutures removed .

## 2015-05-14 ENCOUNTER — Ambulatory Visit (INDEPENDENT_AMBULATORY_CARE_PROVIDER_SITE_OTHER): Payer: Medicare Other | Admitting: Family Medicine

## 2015-05-14 ENCOUNTER — Encounter: Payer: Self-pay | Admitting: Family Medicine

## 2015-05-14 VITALS — BP 106/62 | HR 101 | Temp 97.7°F | Resp 16 | Ht 60.0 in | Wt 180.3 lb

## 2015-05-14 DIAGNOSIS — J410 Simple chronic bronchitis: Secondary | ICD-10-CM | POA: Diagnosis not present

## 2015-05-14 DIAGNOSIS — E1142 Type 2 diabetes mellitus with diabetic polyneuropathy: Secondary | ICD-10-CM | POA: Diagnosis not present

## 2015-05-14 DIAGNOSIS — E1129 Type 2 diabetes mellitus with other diabetic kidney complication: Secondary | ICD-10-CM | POA: Diagnosis not present

## 2015-05-14 DIAGNOSIS — J449 Chronic obstructive pulmonary disease, unspecified: Secondary | ICD-10-CM

## 2015-05-14 DIAGNOSIS — G2581 Restless legs syndrome: Secondary | ICD-10-CM

## 2015-05-14 DIAGNOSIS — L209 Atopic dermatitis, unspecified: Secondary | ICD-10-CM | POA: Diagnosis not present

## 2015-05-14 DIAGNOSIS — R809 Proteinuria, unspecified: Secondary | ICD-10-CM

## 2015-05-14 DIAGNOSIS — E785 Hyperlipidemia, unspecified: Secondary | ICD-10-CM

## 2015-05-14 DIAGNOSIS — J42 Unspecified chronic bronchitis: Secondary | ICD-10-CM | POA: Diagnosis not present

## 2015-05-14 DIAGNOSIS — M545 Low back pain, unspecified: Secondary | ICD-10-CM

## 2015-05-14 DIAGNOSIS — G8929 Other chronic pain: Secondary | ICD-10-CM

## 2015-05-14 DIAGNOSIS — E119 Type 2 diabetes mellitus without complications: Secondary | ICD-10-CM | POA: Insufficient documentation

## 2015-05-14 DIAGNOSIS — Z720 Tobacco use: Secondary | ICD-10-CM

## 2015-05-14 DIAGNOSIS — IMO0001 Reserved for inherently not codable concepts without codable children: Secondary | ICD-10-CM

## 2015-05-14 LAB — POCT GLYCOSYLATED HEMOGLOBIN (HGB A1C): HEMOGLOBIN A1C: 6.2

## 2015-05-14 MED ORDER — GABAPENTIN 300 MG PO CAPS: 300.0000 mg | ORAL_CAPSULE | Freq: Three times a day (TID) | ORAL | Status: DC

## 2015-05-14 MED ORDER — LISINOPRIL 5 MG PO TABS: 5.0000 mg | ORAL_TABLET | Freq: Every day | ORAL | Status: DC

## 2015-05-14 MED ORDER — SIMVASTATIN 10 MG PO TABS: 10.0000 mg | ORAL_TABLET | Freq: Every day | ORAL | Status: DC

## 2015-05-14 MED ORDER — ROPINIROLE HCL 1 MG PO TABS: 1.0000 mg | ORAL_TABLET | Freq: Every day | ORAL | Status: DC

## 2015-05-14 MED ORDER — HYDROCODONE-ACETAMINOPHEN 5-325 MG PO TABS: 2.0000 | ORAL_TABLET | Freq: Two times a day (BID) | ORAL | Status: DC

## 2015-05-14 MED ORDER — ALBUTEROL SULFATE (2.5 MG/3ML) 0.083% IN NEBU
2.5000 mg | INHALATION_SOLUTION | Freq: Once | RESPIRATORY_TRACT | Status: AC
  Administered 2015-05-14: 2.5 mg via RESPIRATORY_TRACT

## 2015-05-14 MED ORDER — SITAGLIPTIN PHOS-METFORMIN HCL 50-1000 MG PO TABS: 1.0000 | ORAL_TABLET | Freq: Two times a day (BID) | ORAL | Status: DC

## 2015-05-14 MED ORDER — CYCLOBENZAPRINE HCL 5 MG PO TABS: 5.0000 mg | ORAL_TABLET | Freq: Every day | ORAL | Status: DC

## 2015-05-14 MED ORDER — FLUTICASONE FUROATE-VILANTEROL 100-25 MCG/INH IN AEPB: 1.0000 | INHALATION_SPRAY | Freq: Every day | RESPIRATORY_TRACT | Status: DC

## 2015-05-14 NOTE — Progress Notes (Signed)
Name: Julie Wall   MRN: [id number]    [birth year]osalee Tolley2, 1957   Date:05/14/2015       Progress Note  Subjective  Chief Complaint  Chief Complaint  Patient presents with  . Medication Refill    follow-up  . Diabetes    check glucose 1x day low-85, high-112  . Hypertension  . Hyperlipidemia    muscle cramps  . Leg Pain    Restless leg   . Hand Pain    recheck finger from stiches  . COPD    wheezing and cough    HPI   DMII with neuropathy and proteinuria:  she has neurogenic claudication and history of proteinuria. She has been compliant with medication and hgbA1C is at goal. Denies polyphagia, polydipsia or polyuria. She is on gabapentin and states leg pain has improvement, also takes ACE.  Hyperlipidemia: taking simvastatin and is due for labs, denies side effects of medications. Explained the importance of continue on medication to decrease risk of heart attacks or strokes  COPD: she still smokes daily, had a COPD flare about one month ago, currently on Breo and is doing well, still has a morning cough but now sputum is clear. She has some SOB with activity. Occasionally has wheezing, no fever.   Back pain: she has back pain most days of the week, pain now is zero but at times can go up to 9/10. Worse with increase in activity, taking hydrocodone ( prescription lasts 3-4 months )prn and flexeril at night sometimes to control symptoms. She denies  leg weakness, no bowel or bladder incontinence  RLS: She has been taking Requip in am's and states symptoms are not controlled, explained the need to take it at night.   Finger laceration: she is still covering the area and getting hand wet. It is macerated and still sore, but healing, swelling it down.   Atopic Dermatitis: doing much better on methotrexate continues to see [facility] dermatologist.    Patient Active Problem List   Diagnosis Date Noted  . Chronic bronchitis (HCC) 05/14/2015  . DM type 2 with diabetic peripheral neuropathy (HCC)  05/14/2015  . Positive H. pylori test 04/05/2015  . Umbilical hernia 04/01/2015  . RLS (restless legs syndrome) 01/08/2015  . Type 2 diabetes mellitus with other diabetic kidney complication (HCC) 01/08/2015  . Neurogenic claudication 01/08/2015  . AD (atopic dermatitis) 12/28/2014  . Chronic LBP 12/28/2014  . Dyslipidemia 12/28/2014  . Gastro-esophageal reflux disease without esophagitis 12/28/2014  . Microalbuminuria 12/28/2014  . Disorder of bone and cartilage 12/28/2014  . Obesity (BMI 30-39.9) 12/28/2014  . Neuralgia of left thigh 12/28/2014  . Perennial allergic rhinitis with seasonal variation 12/28/2014  . Tobacco abuse 12/28/2014    Past Surgical History  Procedure Laterality Date  . Cesarean section    . Cataract extraction Bilateral 1980  . Corneal transplant Left     [facility]-Treatment for blindness.    Family History  Problem Relation Age of Onset  . Diabetes Mother   . Hypertension Mother   . Alcohol abuse Father     Social History   Social History  . Marital Status: Single    Spouse Name: N/A  . Number of Children: N/A  . Years of Education: N/A   Occupational History  . Not on file.   Social History Main Topics  . Smoking status: Current Every Day Smoker -- 0.75 packs/day for 30 years    Types: Cigarettes    Start date: 01/07/1985  . Smokeless tobacco:  Never Used  . Alcohol Use: No  . Drug Use: No  . Sexual Activity: No   Other Topics Concern  . Not on file   Social History Narrative     Current outpatient prescriptions:  .  aspirin 81 MG chewable tablet, Chew 1 tablet by mouth daily., Disp: , Rfl:  .  cyclobenzaprine (FLEXERIL) 5 MG tablet, Take 1 tablet (5 mg total) by mouth at bedtime., Disp: 30 tablet, Rfl: 2 .  Fluticasone Furoate-Vilanterol (BREO ELLIPTA) 100-25 MCG/INH AEPB, Inhale 1 puff into the lungs daily., Disp: 28 each, Rfl: 2 .  gabapentin (NEURONTIN) 300 MG capsule, Take 1 capsule (300 mg total) by mouth 3 (three) times  daily., Disp: 90 capsule, Rfl: 5 .  guaiFENesin-codeine 100-10 MG/5ML syrup, Take 5-10 mLs by mouth 3 (three) times daily as needed for cough., Disp: 240 mL, Rfl: 0 .  HYDROcodone-acetaminophen (NORCO/VICODIN) 5-325 MG tablet, Take 2 tablets by mouth 2 (two) times daily., Disp: 60 tablet, Rfl: 0 .  Ipratropium-Albuterol (COMBIVENT RESPIMAT) 20-100 MCG/ACT AERS respimat, Inhale 1 puff into the lungs 4 (four) times daily., Disp: 1 Inhaler, Rfl: 2 .  lisinopril (PRINIVIL,ZESTRIL) 5 MG tablet, Take 1 tablet (5 mg total) by mouth daily., Disp: 30 tablet, Rfl: 5 .  METHOTREXATE, ANTI-RHEUMATIC, PO, Take 3 tablets by mouth once a week., Disp: , Rfl:  .  rOPINIRole (REQUIP) 1 MG tablet, Take 1 tablet (1 mg total) by mouth daily., Disp: 30 tablet, Rfl: 5 .  simvastatin (ZOCOR) 10 MG tablet, Take 1 tablet (10 mg total) by mouth daily., Disp: 30 tablet, Rfl: 5 .  sitaGLIPtin-metformin (JANUMET) 50-1000 MG tablet, Take 1 tablet by mouth 2 (two) times daily., Disp: 60 tablet, Rfl: 5 .  triamcinolone cream (KENALOG) 0.1 %, TRIAMCINOLONE ACETONIDE, 0.1% (External Cream)  use per dermatologist. for 0 days  Quantity: 1.00;  Refills: 0   Ordered :15-Jun-2010  Alba Cory MD;  Started 02-Apr-2008 Active Comments: DX: 724.2, Disp: , Rfl:   Allergies  Allergen Reactions  . Penicillins      ROS  Constitutional: Negative for fever or weight change.  Respiratory: Positive  for cough and shortness of breath.   Cardiovascular: Negative for chest pain or palpitations.  Gastrointestinal: Negative for abdominal pain, no bowel changes.  Musculoskeletal: Negative for gait problem or joint swelling.  Skin: Positive  for rash.  Neurological: Negative for dizziness or headache.  No other specific complaints in a complete review of systems (except as listed in HPI above).  Objective  Filed Vitals:   05/14/15 0947  BP: 106/62  Pulse: 101  Temp: 97.7 F (36.5 C)  TempSrc: Oral  Resp: 16  Height: 5' (1.524 m)   Weight: 180 lb 4.8 oz (81.784 kg)  SpO2: 94%    Body mass index is 35.21 kg/(m^2).  Physical Exam  Constitutional: Patient appears well-developed. Obese No distress.  HEENT: head atraumatic, normocephalic, pupils equal and reactive to light,  neck supple, throat within normal limits Cardiovascular: Normal rate, regular rhythm and normal heart sounds.  No murmur heard. No BLE edema. Pulmonary/Chest: Effort normal and breath sounds normal. No respiratory distress. Abdominal: Soft.  There is no tenderness. Psychiatric: Patient has a normal mood and affect. behavior is normal. Judgment and thought content normal. Skin: maceration on the 3rd middle finger where the sutures were. Still tender to touch. Atopic dermatitis is under control, hypopigmentation secondary to scarring Muscular skeletal: mild lower back spasms, normal rom.    Recent Results (from the past 2160  hour(s))  CBC with Differential/Platelet     Status: None   Collection Time: 04/02/15  9:56 AM  Result Value Ref Range   WBC 7.9 3.4 - 10.8 x10E3/uL   RBC 4.33 3.77 - 5.28 x10E6/uL   Hemoglobin 13.8 11.1 - 15.9 g/dL   Hematocrit 04.539.6 40.934.0 - 46.6 %   MCV 92 79 - 97 fL   MCH 31.9 26.6 - 33.0 pg   MCHC 34.8 31.5 - 35.7 g/dL   RDW 81.113.7 91.412.3 - 78.215.4 %   Platelets 261 150 - 379 x10E3/uL   Neutrophils 69 %   Lymphs 22 %   Monocytes 4 %   Eos 5 %   Basos 0 %   Neutrophils Absolute 5.5 1.4 - 7.0 x10E3/uL   Lymphocytes Absolute 1.7 0.7 - 3.1 x10E3/uL   Monocytes Absolute 0.3 0.1 - 0.9 x10E3/uL   EOS (ABSOLUTE) 0.4 0.0 - 0.4 x10E3/uL   Basophils Absolute 0.0 0.0 - 0.2 x10E3/uL   Immature Granulocytes 0 %   Immature Grans (Abs) 0.0 0.0 - 0.1 x10E3/uL  Comprehensive metabolic panel     Status: Abnormal   Collection Time: 04/02/15  9:56 AM  Result Value Ref Range   Glucose 94 65 - 99 mg/dL   BUN 12 8 - 27 mg/dL   Creatinine, Ser 9.560.55 (L) 0.57 - 1.00 mg/dL   GFR calc non Af Amer 102 >59 mL/min/1.73   GFR calc Af Amer 118  >59 mL/min/1.73   BUN/Creatinine Ratio 22 11 - 26   Sodium 140 136 - 144 mmol/L    Comment:               **Please note reference interval change**   Potassium 4.1 3.5 - 5.2 mmol/L    Comment:               **Please note reference interval change**   Chloride 105 97 - 106 mmol/L    Comment:               **Please note reference interval change**   CO2 21 18 - 29 mmol/L   Calcium 9.2 8.7 - 10.3 mg/dL   Total Protein 6.8 6.0 - 8.5 g/dL   Albumin 4.0 3.6 - 4.8 g/dL   Globulin, Total 2.8 1.5 - 4.5 g/dL   Albumin/Globulin Ratio 1.4 1.1 - 2.5   Bilirubin Total 0.2 0.0 - 1.2 mg/dL   Alkaline Phosphatase 80 39 - 117 IU/L   AST 11 0 - 40 IU/L   ALT 11 0 - 32 IU/L  Lipid panel     Status: None   Collection Time: 04/02/15  9:56 AM  Result Value Ref Range   Cholesterol, Total 119 100 - 199 mg/dL   Triglycerides 73 0 - 149 mg/dL   HDL 49 >21>39 mg/dL    Comment: According to ATP-III Guidelines, HDL-C >59 mg/dL is considered a negative risk factor for CHD.    VLDL Cholesterol Cal 15 5 - 40 mg/dL   LDL Calculated 55 0 - 99 mg/dL   Chol/HDL Ratio 2.4 0.0 - 4.4 ratio units    Comment:                                   T. Chol/HDL Ratio  Men  Women                               1/2 Avg.Risk  3.4    3.3                                   Avg.Risk  5.0    4.4                                2X Avg.Risk  9.6    7.1                                3X Avg.Risk 23.4   11.0   Lipase     Status: None   Collection Time: 04/02/15  9:56 AM  Result Value Ref Range   Lipase 56 0 - 59 U/L  H. pylori antibody, IgG     Status: Abnormal   Collection Time: 04/02/15  9:56 AM  Result Value Ref Range   H Pylori IgG 5.5 (H) 0.0 - 0.8 U/mL    Comment:                              Negative            <0.9                              Indeterminate  0.9 - 1.0                              Positive            >1.0   POCT HgB A1C     Status: None   Collection Time: 05/14/15   9:54 AM  Result Value Ref Range   Hemoglobin A1C 6.2      PHQ2/9: Depression screen [facility] 2/9 05/06/2015 04/01/2015 01/08/2015  Decreased Interest 0 0 0  Down, Depressed, Hopeless 0 0 0  PHQ - 2 Score 0 0 0     Fall Risk: Fall Risk  05/06/2015 04/01/2015 01/08/2015  Falls in the past year? No No No    Assessment & Plan  1. Type 2 diabetes mellitus with other diabetic kidney complication, without Rego-term current use of insulin (HCC)  - POCT HgB A1C - lisinopril (PRINIVIL,ZESTRIL) 5 MG tablet; Take 1 tablet (5 mg total) by mouth daily.  Dispense: 30 tablet; Refill: 5  2. Simple chronic bronchitis (HCC)  - albuterol (PROVENTIL) (2.5 MG/3ML) 0.083% nebulizer solution 2.5 mg; Take 3 mLs (2.5 mg total) by nebulization once. - Spirometry with graph  3. Tobacco abuse  Needs to quit  4. RLS (restless legs syndrome)  Needs to take it at night - rOPINIRole (REQUIP) 1 MG tablet; Take 1 tablet (1 mg total) by mouth daily.  Dispense: 30 tablet; Refill: 5  5. Dyslipidemia  - simvastatin (ZOCOR) 10 MG tablet; Take 1 tablet (10 mg total) by mouth daily.  Dispense: 30 tablet; Refill: 5  6. DM type 2 with diabetic peripheral neuropathy (HCC)  Continue gabapentin  7. AD (atopic dermatitis)  Continue follow up with  dermatologist  8. COPD with bronchitis  - Fluticasone Furoate-Vilanterol (BREO ELLIPTA) 100-25 MCG/INH AEPB; Inhale 1 puff into the lungs daily.  Dispense: 28 each; Refill: 2  9. Chronic LBP  - cyclobenzaprine (FLEXERIL) 5 MG tablet; Take 1 tablet (5 mg total) by mouth at bedtime.  Dispense: 30 tablet; Refill: 2 - HYDROcodone-acetaminophen (NORCO/VICODIN) 5-325 MG tablet; Take 2 tablets by mouth 2 (two) times daily.  Dispense: 60 tablet; Refill: 0 - gabapentin (NEURONTIN) 300 MG capsule; Take 1 capsule (300 mg total) by mouth 3 (three) times daily.  Dispense: 90 capsule; Refill: 5  10. Type 2 diabetes mellitus with other diabetic kidney complication (HCC)  -  sitaGLIPtin-metformin (JANUMET) 50-1000 MG tablet; Take 1 tablet by mouth 2 (two) times daily.  Dispense: 60 tablet; Refill: 5  11. Microalbuminuria  - lisinopril (PRINIVIL,ZESTRIL) 5 MG tablet; Take 1 tablet (5 mg total) by mouth daily.  Dispense: 30 tablet; Refill: 5

## 2015-08-04 LAB — HM DIABETES FOOT EXAM: HM Diabetic Foot Exam: NORMAL

## 2015-08-12 ENCOUNTER — Encounter: Payer: Self-pay | Admitting: Family Medicine

## 2015-08-12 ENCOUNTER — Ambulatory Visit (INDEPENDENT_AMBULATORY_CARE_PROVIDER_SITE_OTHER): Payer: Medicare Other | Admitting: Family Medicine

## 2015-08-12 VITALS — HR 93 | Temp 97.8°F | Resp 18 | Ht 60.0 in | Wt 184.3 lb

## 2015-08-12 DIAGNOSIS — M545 Low back pain: Secondary | ICD-10-CM

## 2015-08-12 DIAGNOSIS — M4806 Spinal stenosis, lumbar region: Secondary | ICD-10-CM

## 2015-08-12 DIAGNOSIS — G9519 Other vascular myelopathies: Secondary | ICD-10-CM

## 2015-08-12 DIAGNOSIS — E1129 Type 2 diabetes mellitus with other diabetic kidney complication: Secondary | ICD-10-CM | POA: Diagnosis not present

## 2015-08-12 DIAGNOSIS — E785 Hyperlipidemia, unspecified: Secondary | ICD-10-CM | POA: Diagnosis not present

## 2015-08-12 DIAGNOSIS — IMO0001 Reserved for inherently not codable concepts without codable children: Secondary | ICD-10-CM

## 2015-08-12 DIAGNOSIS — L209 Atopic dermatitis, unspecified: Secondary | ICD-10-CM

## 2015-08-12 DIAGNOSIS — J449 Chronic obstructive pulmonary disease, unspecified: Secondary | ICD-10-CM

## 2015-08-12 DIAGNOSIS — G2581 Restless legs syndrome: Secondary | ICD-10-CM | POA: Diagnosis not present

## 2015-08-12 DIAGNOSIS — G8929 Other chronic pain: Secondary | ICD-10-CM

## 2015-08-12 DIAGNOSIS — E1142 Type 2 diabetes mellitus with diabetic polyneuropathy: Secondary | ICD-10-CM

## 2015-08-12 DIAGNOSIS — M5412 Radiculopathy, cervical region: Secondary | ICD-10-CM

## 2015-08-12 DIAGNOSIS — M48062 Spinal stenosis, lumbar region with neurogenic claudication: Secondary | ICD-10-CM

## 2015-08-12 LAB — POCT GLYCOSYLATED HEMOGLOBIN (HGB A1C): HEMOGLOBIN A1C: 7.6

## 2015-08-12 MED ORDER — HYDROCODONE-ACETAMINOPHEN 5-325 MG PO TABS: 2.0000 | ORAL_TABLET | Freq: Two times a day (BID) | ORAL | Status: DC

## 2015-08-12 MED ORDER — EMPAGLIFLOZIN 25 MG PO TABS: 25.0000 mg | ORAL_TABLET | Freq: Every day | ORAL | Status: DC

## 2015-08-12 MED ORDER — FLUTICASONE FUROATE-VILANTEROL 100-25 MCG/INH IN AEPB: 1.0000 | INHALATION_SPRAY | Freq: Every day | RESPIRATORY_TRACT | Status: DC

## 2015-08-12 MED ORDER — LINAGLIPTIN 5 MG PO TABS: 5.0000 mg | ORAL_TABLET | Freq: Every day | ORAL | Status: DC

## 2015-08-12 NOTE — Addendum Note (Signed)
Addended by: Alba CorySOWLES, Kyheem Bathgate F on: 08/12/2015 12:32 PM   Modules accepted: Orders, Medications

## 2015-08-12 NOTE — Progress Notes (Addendum)
Name: Julie Wall   MRN: [id number]    DOB: 12/16/1955   Date:08/12/2015       Progress Note  Subjective  Chief Complaint  Chief Complaint  Patient presents with  . Diabetes    patient is here for a 3576-month f/u. Checks BS once weekly, Low-116 Average-125High-199  . Back Pain    chronic LBP  . Hyperlipidemia    dyslipidemia  . COPD  . Leg Pain    restless leg   . Eczema    atopic dermatitis (sees dermatologist)  . Numbness    Onset-couple of weeks, left arm radiates to shoulder-her whole arm will go completely numb and feels like it is asleep. Is concerned about these symptoms    HPI  DMII with neuropathy and proteinuria: she has neurogenic claudication and history of proteinuria. She has been compliant with medication, but has not been compliant with her diet and hgbA1C has gone up to 7.6%. Denies polyphagia, polydipsia but she has polyuria. She is on gabapentin and states leg pain has improvement, also takes ACE.  Hyperlipidemia: taking simvastatin and labs reviewed with patient.  Denies side effects of medications. Explained the importance of continue on medication to decrease risk of heart attacks or strokes  COPD: she still smokes daily, had a COPD flare in the Fall 2016 , currently on Breo and Combivent prn  and is doing well at this time. She also stopped smoking on her birthday ( [birth year]) , still has a morning cough but sputum is clear. She has some SOB with activity. No wheezing.   Back pain: she has back pain most days of the week, pain now 6/10  but at times can go up to 9/10. Worse with increase in activity, taking hydrocodone ( prescription lasts 3-4 months )prn and flexeril at night sometimes to control symptoms. She denies leg weakness, no bowel or bladder incontinence  RLS: She has been taking Requip at night and is controlling symptoms now.   Atopic Dermatitis: doing much better on methotrexate [facility].   Cervical Radiculitis: she  states that over the past couple of weeks she has noticed intermittent numbness, tingling on left thumb that goes up to her arm. No shoulder pain, no decrease in ROM. Pain radiates to neck, head, back or anterior chest. No association with weakness. She denies chest tightness, diaphoresis or SOB during episodes. She takes gabapentin for neurogenic legs , episodes happens at rest or during activity.   Patient Active Problem List   Diagnosis Date Noted  . Chronic bronchitis (HCC) 05/14/2015  . DM type 2 with diabetic peripheral neuropathy (HCC) 05/14/2015  . Positive H. pylori test 04/05/2015  . Umbilical hernia 04/01/2015  . RLS (restless legs syndrome) 01/08/2015  . Type 2 diabetes mellitus with other diabetic kidney complication (HCC) 01/08/2015  . Neurogenic claudication 01/08/2015  . AD (atopic dermatitis) 12/28/2014  . Chronic LBP 12/28/2014  . Dyslipidemia 12/28/2014  . Gastro-esophageal reflux disease without esophagitis 12/28/2014  . Microalbuminuria 12/28/2014  . Disorder of bone and cartilage 12/28/2014  . Obesity (BMI 30-39.9) 12/28/2014  . Neuralgia of left thigh 12/28/2014  . Perennial allergic rhinitis with seasonal variation 12/28/2014  . Tobacco abuse 12/28/2014    Past Surgical History  Procedure Laterality Date  . Cesarean section    . Cataract extraction Bilateral 1980  . Corneal transplant Left     [facility]-Treatment for blindness.    Family History  Problem Relation Age of Onset  . Diabetes Mother   .  Hypertension Mother   . Alcohol abuse Father     Social History   Social History  . Marital Status: Single    Spouse Name: N/A  . Number of Children: N/A  . Years of Education: N/A   Occupational History  . Not on file.   Social History Main Topics  . Smoking status: Former Smoker -- 1.00 packs/day for 30 years    Types: Cigarettes    Start date: 01/07/1985    Quit date: 08/07/2015  . Smokeless tobacco: Never Used  . Alcohol Use: No  . Drug Use: No   . Sexual Activity: No   Other Topics Concern  . Not on file   Social History Narrative     Current outpatient prescriptions:  .  aspirin 81 MG chewable tablet, Chew 1 tablet by mouth daily., Disp: , Rfl:  .  cyclobenzaprine (FLEXERIL) 5 MG tablet, Take 1 tablet (5 mg total) by mouth at bedtime., Disp: 30 tablet, Rfl: 2 .  fluticasone furoate-vilanterol (BREO ELLIPTA) 100-25 MCG/INH AEPB, Inhale 1 puff into the lungs daily., Disp: 28 each, Rfl: 2 .  folic acid (FOLVITE) 1 MG tablet, Take 1 mg by mouth., Disp: , Rfl:  .  gabapentin (NEURONTIN) 300 MG capsule, Take 1 capsule (300 mg total) by mouth 3 (three) times daily., Disp: 90 capsule, Rfl: 5 .  HYDROcodone-acetaminophen (NORCO/VICODIN) 5-325 MG tablet, Take 2 tablets by mouth 2 (two) times daily., Disp: 60 tablet, Rfl: 0 .  Ipratropium-Albuterol (COMBIVENT RESPIMAT) 20-100 MCG/ACT AERS respimat, Inhale 1 puff into the lungs 4 (four) times daily., Disp: 1 Inhaler, Rfl: 2 .  lisinopril (PRINIVIL,ZESTRIL) 5 MG tablet, Take 1 tablet (5 mg total) by mouth daily., Disp: 30 tablet, Rfl: 5 .  methotrexate (RHEUMATREX) 2.5 MG tablet, Take 15 mg by mouth., Disp: , Rfl:  .  rOPINIRole (REQUIP) 1 MG tablet, Take 1 tablet (1 mg total) by mouth daily., Disp: 30 tablet, Rfl: 5 .  simvastatin (ZOCOR) 10 MG tablet, Take 1 tablet (10 mg total) by mouth daily., Disp: 30 tablet, Rfl: 5 .  sitaGLIPtin-metformin (JANUMET) 50-1000 MG tablet, Take 1 tablet by mouth 2 (two) times daily., Disp: 60 tablet, Rfl: 5 .  triamcinolone cream (KENALOG) 0.1 %, TRIAMCINOLONE ACETONIDE, 0.1% (External Cream)  use per dermatologist. for 0 days  Quantity: 1.00;  Refills: 0   Ordered :15-Jun-2010  Alba Cory MD;  Started 02-Apr-2008 Active Comments: DX: 724.2, Disp: , Rfl:  .  linagliptin (TRADJENTA) 5 MG TABS tablet, Take 1 tablet (5 mg total) by mouth daily., Disp: 30 tablet, Rfl: 3  Allergies  Allergen Reactions  . Penicillins      ROS  Constitutional: Negative  for fever , positive for mild weight gain Respiratory: Positive for cough and shortness of breath.   Cardiovascular: Negative for chest pain or palpitations.  Gastrointestinal: Negative for abdominal pain, no bowel changes.  Musculoskeletal: Negative for gait problem or joint swelling.  Skin: Positive  for rash.  Neurological: Negative for dizziness or headache.  No other specific complaints in a complete review of systems (except as listed in HPI above).  Objective  Filed Vitals:   08/12/15 1029  Pulse: 93  Temp: 97.8 F (36.6 C)  TempSrc: Oral  Resp: 18  Height: 5' (1.524 m)  Weight: 184 lb 4.8 oz (83.598 kg)  SpO2: 95%    Body mass index is 35.99 kg/(m^2).  Physical Exam  Constitutional: Patient appears well-developed. Obese No distress.  HEENT: head atraumatic, normocephalic, pupils equal  and reactive to light, neck supple, normal rom , no pain during exam, throat within normal limits Cardiovascular: Normal rate, regular rhythm and normal heart sounds. No murmur heard. Trace  BLE edema. Pulmonary/Chest: Effort normal and breath sounds normal. No respiratory distress. Abdominal: Soft. There is no tenderness. Psychiatric: Patient has a normal mood and affect. behavior is normal. Judgment and thought content normal. Skin: Atopic dermatitis is under control, hypopigmentation secondary to scarring Muscular skeletal: mild lower back spasms, normal rom. Negative straight leg raise Neurological: normal sensation , normal grip  Recent Results (from the past 2160 hour(s))  POCT glycosylated hemoglobin (Hb A1C)     Status: Abnormal   Collection Time: 08/12/15 11:01 AM  Result Value Ref Range   Hemoglobin A1C 7.6     PHQ2/9: Depression screen [facility] 2/9 08/12/2015 05/06/2015 04/01/2015 01/08/2015  Decreased Interest 0 0 0 0  Down, Depressed, Hopeless 0 0 0 0  PHQ - 2 Score 0 0 0 0    Fall Risk: Fall Risk  08/12/2015 05/06/2015 04/01/2015 01/08/2015  Falls in the past year? No No No  No    Functional Status Survey: Is the patient deaf or have difficulty hearing?: No Does the patient have difficulty seeing, even when wearing glasses/contacts?: No Does the patient have difficulty concentrating, remembering, or making decisions?: No Does the patient have difficulty walking or climbing stairs?: No Does the patient have difficulty dressing or bathing?: No Does the patient have difficulty doing errands alone such as visiting a doctor's office or shopping?: Yes (Patient does not drive)    Assessment & Plan  1. DM type 2 with diabetic peripheral neuropathy (HCC)  Glucose has gone up with will start her on Tradjenta. Discussed possible side effects - POCT glycosylated hemoglobin (Hb A1C) - linagliptin (TRADJENTA) 5 MG TABS tablet; Take 1 tablet (5 mg total) by mouth daily.  Dispense: 30 tablet; Refill: 3  2. Type 2 diabetes mellitus with other diabetic kidney complication, without Reist-term current use of insulin (HCC)  -Jardiance 25 mg one daily #30 and 2 refills  3. Dyslipidemia  Continue statin therapy   4. COPD with bronchitis  Very proud of her for quitting smoking - fluticasone furoate-vilanterol (BREO ELLIPTA) 100-25 MCG/INH AEPB; Inhale 1 puff into the lungs daily.  Dispense: 28 each; Refill: 2  5. Neurogenic claudication  Continue gabapentin  6. RLS (restless legs syndrome)  Doing well with Requip at night  7. Chronic LBP  - HYDROcodone-acetaminophen (NORCO/VICODIN) 5-325 MG tablet; Take 2 tablets by mouth 2 (two) times daily.  Dispense: 60 tablet; Refill: 0  8. AD (atopic dermatitis)  Continue follow up with [facility]  9. Cervical radiculopathy at C6  Reassurance for now, call back if worsening of symptoms for cervical spine MRI

## 2015-10-13 ENCOUNTER — Other Ambulatory Visit: Payer: Self-pay | Admitting: Family Medicine

## 2015-10-13 MED ORDER — FLUCONAZOLE 150 MG PO TABS: 150.0000 mg | ORAL_TABLET | Freq: Once | ORAL | Status: DC

## 2015-10-13 NOTE — Telephone Encounter (Signed)
Refill request was sent to Dr. Krichna Sowles for approval and submission.  

## 2015-10-13 NOTE — Telephone Encounter (Signed)
Patient has developed yeast infection due to her medication and is requesting antibiotic. Please send to [facility] on [address]

## 2015-11-15 ENCOUNTER — Other Ambulatory Visit: Payer: Self-pay

## 2015-11-15 DIAGNOSIS — E785 Hyperlipidemia, unspecified: Secondary | ICD-10-CM

## 2015-11-15 DIAGNOSIS — R809 Proteinuria, unspecified: Secondary | ICD-10-CM

## 2015-11-15 DIAGNOSIS — E1129 Type 2 diabetes mellitus with other diabetic kidney complication: Secondary | ICD-10-CM

## 2015-11-15 NOTE — Telephone Encounter (Signed)
Refill request was sent to Dr. Krichna Sowles for approval and submission.  

## 2015-11-16 MED ORDER — SIMVASTATIN 10 MG PO TABS: 10.0000 mg | ORAL_TABLET | Freq: Every day | ORAL | Status: DC

## 2015-11-16 MED ORDER — LISINOPRIL 5 MG PO TABS: 5.0000 mg | ORAL_TABLET | Freq: Every day | ORAL | Status: DC

## 2015-12-13 ENCOUNTER — Ambulatory Visit (INDEPENDENT_AMBULATORY_CARE_PROVIDER_SITE_OTHER): Payer: Medicare Other | Admitting: Family Medicine

## 2015-12-13 ENCOUNTER — Encounter: Payer: Self-pay | Admitting: Family Medicine

## 2015-12-13 VITALS — BP 100/60 | HR 109 | Temp 98.4°F | Resp 18 | Wt 178.4 lb

## 2015-12-13 DIAGNOSIS — M4806 Spinal stenosis, lumbar region: Secondary | ICD-10-CM

## 2015-12-13 DIAGNOSIS — E1129 Type 2 diabetes mellitus with other diabetic kidney complication: Secondary | ICD-10-CM

## 2015-12-13 DIAGNOSIS — G8929 Other chronic pain: Secondary | ICD-10-CM | POA: Diagnosis not present

## 2015-12-13 DIAGNOSIS — R202 Paresthesia of skin: Secondary | ICD-10-CM

## 2015-12-13 DIAGNOSIS — R634 Abnormal weight loss: Secondary | ICD-10-CM | POA: Diagnosis not present

## 2015-12-13 DIAGNOSIS — M545 Low back pain, unspecified: Secondary | ICD-10-CM

## 2015-12-13 DIAGNOSIS — J449 Chronic obstructive pulmonary disease, unspecified: Secondary | ICD-10-CM

## 2015-12-13 DIAGNOSIS — E669 Obesity, unspecified: Secondary | ICD-10-CM | POA: Diagnosis not present

## 2015-12-13 DIAGNOSIS — Z1231 Encounter for screening mammogram for malignant neoplasm of breast: Secondary | ICD-10-CM

## 2015-12-13 DIAGNOSIS — IMO0001 Reserved for inherently not codable concepts without codable children: Secondary | ICD-10-CM

## 2015-12-13 DIAGNOSIS — G2581 Restless legs syndrome: Secondary | ICD-10-CM | POA: Diagnosis not present

## 2015-12-13 DIAGNOSIS — G9519 Other vascular myelopathies: Secondary | ICD-10-CM

## 2015-12-13 DIAGNOSIS — L209 Atopic dermatitis, unspecified: Secondary | ICD-10-CM | POA: Diagnosis not present

## 2015-12-13 DIAGNOSIS — M48062 Spinal stenosis, lumbar region with neurogenic claudication: Secondary | ICD-10-CM

## 2015-12-13 LAB — COMPREHENSIVE METABOLIC PANEL
ALBUMIN: 4 g/dL (ref 3.6–5.1)
ALT: 10 U/L (ref 6–29)
AST: 12 U/L (ref 10–35)
Alkaline Phosphatase: 84 U/L (ref 33–130)
BUN: 14 mg/dL (ref 7–25)
CHLORIDE: 108 mmol/L (ref 98–110)
CO2: 25 mmol/L (ref 20–31)
CREATININE: 0.69 mg/dL (ref 0.50–0.99)
Calcium: 9.4 mg/dL (ref 8.6–10.4)
Glucose, Bld: 106 mg/dL — ABNORMAL HIGH (ref 65–99)
Potassium: 4.5 mmol/L (ref 3.5–5.3)
Sodium: 143 mmol/L (ref 135–146)
Total Bilirubin: 0.2 mg/dL (ref 0.2–1.2)
Total Protein: 7.3 g/dL (ref 6.1–8.1)

## 2015-12-13 LAB — CBC WITH DIFFERENTIAL/PLATELET
BASOS PCT: 0 %
Basophils Absolute: 0 cells/uL (ref 0–200)
EOS ABS: 267 {cells}/uL (ref 15–500)
Eosinophils Relative: 3 %
HEMATOCRIT: 45.4 % — AB (ref 35.0–45.0)
HEMOGLOBIN: 15.4 g/dL (ref 11.7–15.5)
LYMPHS ABS: 2492 {cells}/uL (ref 850–3900)
Lymphocytes Relative: 28 %
MCH: 31.9 pg (ref 27.0–33.0)
MCHC: 33.9 g/dL (ref 32.0–36.0)
MCV: 94 fL (ref 80.0–100.0)
MONO ABS: 534 {cells}/uL (ref 200–950)
MPV: 11.3 fL (ref 7.5–12.5)
Monocytes Relative: 6 %
Neutro Abs: 5607 cells/uL (ref 1500–7800)
Neutrophils Relative %: 63 %
Platelets: 274 10*3/uL (ref 140–400)
RBC: 4.83 MIL/uL (ref 3.80–5.10)
RDW: 14.4 % (ref 11.0–15.0)
WBC: 8.9 10*3/uL (ref 3.8–10.8)

## 2015-12-13 LAB — POCT UA - MICROALBUMIN: Microalbumin Ur, POC: 20 mg/L

## 2015-12-13 LAB — TSH: TSH: 0.81 mIU/L

## 2015-12-13 LAB — POCT GLYCOSYLATED HEMOGLOBIN (HGB A1C): Hemoglobin A1C: 7.5

## 2015-12-13 LAB — HIV ANTIBODY (ROUTINE TESTING W REFLEX): HIV: NONREACTIVE

## 2015-12-13 MED ORDER — SITAGLIPTIN PHOS-METFORMIN HCL 50-1000 MG PO TABS: 1.0000 | ORAL_TABLET | Freq: Two times a day (BID) | ORAL | Status: DC

## 2015-12-13 MED ORDER — ROPINIROLE HCL 1 MG PO TABS: 1.0000 mg | ORAL_TABLET | Freq: Every day | ORAL | Status: DC

## 2015-12-13 MED ORDER — EMPAGLIFLOZIN 25 MG PO TABS: 25.0000 mg | ORAL_TABLET | Freq: Every day | ORAL | Status: DC

## 2015-12-13 MED ORDER — FLUTICASONE FUROATE-VILANTEROL 100-25 MCG/INH IN AEPB: 1.0000 | INHALATION_SPRAY | Freq: Every day | RESPIRATORY_TRACT | Status: DC

## 2015-12-13 MED ORDER — CYCLOBENZAPRINE HCL 5 MG PO TABS
5.0000 mg | ORAL_TABLET | Freq: Every day | ORAL | Status: DC
Start: 2015-12-13 — End: 2016-03-14

## 2015-12-13 MED ORDER — HYDROCODONE-ACETAMINOPHEN 5-325 MG PO TABS: 2.0000 | ORAL_TABLET | Freq: Two times a day (BID) | ORAL | Status: DC

## 2015-12-13 MED ORDER — GABAPENTIN 300 MG PO CAPS: 300.0000 mg | ORAL_CAPSULE | Freq: Three times a day (TID) | ORAL | Status: DC

## 2015-12-13 NOTE — Progress Notes (Signed)
Name: Julie Wall   MRN: [id number]    DOB: Aug 03, 1955   Date:12/13/2015       Progress Note  Subjective  Chief Complaint  Chief Complaint  Patient presents with  . Medication Refill  . Diabetes    patient does not check her sugar nad has had tingling in the left arm for the past 2 months. she gets off balance at times.  . Dyslipidemia  . COPD  . Neurogenic claudication  . Restless Leg Syndrome  . Dermatitis  . Numbness  . Chronic LBP    HPI  DMII with neuropathy and proteinuria: she has neurogenic claudication and history of proteinuria. She has been compliant with medication, but has not been compliant with her diet and hgbA1C has been elevated for the past 6 months . Denies polyphagia, polydipsia but she has polyuria. She is on gabapentin and states leg pain has improvement, also takes ACE. She refuses to start any other medications, explained we have weekly injections but she also refuses any injections. Discussed Wilczak term risk of uncontrolled DM.  Hyperlipidemia: taking simvastatin. Denies side effects of medications. Explained the importance of continue on medication to decrease risk of heart attacks or strokes.   COPD: she still smokes daily but she is down to two cigarettes daily, had a COPD flare in the Fall 2016 , currently on Breo and Combivent prn and is doing well at this time. She also stopped smoking on her birthday ( [birth year]) - but resumed it shortly after, she still has a morning cough but sputum is clear. She denies SOB at this time.  No wheezing.   Back pain: she has back pain most days of the week, pain now 5- 6/10 but at times can go up to 9/10. Worse with increase in activity, taking hydrocodone ( prescription lasts 3-4 months ) prn and flexeril at night sometimes to control symptoms. She denies leg weakness, no bowel or bladder incontinence. Discussed risk of addiction and importance of no diversion  RLS: She has been taking Requip at night and is  controlling symptoms now.   Atopic Dermatitis: doing much better on methotrexate continues to see [facility] dermatologist. She had an episode about one month ago of her entire skin pilling, went to her Dermatologist and symptoms resolved by itself  Paresthesia : she states that over the past few months she has noticed intermittent  tingling down to left shoulder, left anterior chest and down her left arm. Episodes lasts about 15 minutes- during rest or activity. No association with weakness. She denies chest tightness, palpitation, diaphoresis or SOB during episodes.   Patient Active Problem List   Diagnosis Date Noted  . Chronic bronchitis (HCC) 05/14/2015  . DM type 2 with diabetic peripheral neuropathy (HCC) 05/14/2015  . Positive H. pylori test 04/05/2015  . Umbilical hernia 04/01/2015  . RLS (restless legs syndrome) 01/08/2015  . Type 2 diabetes mellitus with other diabetic kidney complication (HCC) 01/08/2015  . Neurogenic claudication 01/08/2015  . AD (atopic dermatitis) 12/28/2014  . Chronic LBP 12/28/2014  . Dyslipidemia 12/28/2014  . Gastro-esophageal reflux disease without esophagitis 12/28/2014  . Microalbuminuria 12/28/2014  . Disorder of bone and cartilage 12/28/2014  . Obesity (BMI 30-39.9) 12/28/2014  . Neuralgia of left thigh 12/28/2014  . Perennial allergic rhinitis with seasonal variation 12/28/2014  . Tobacco abuse 12/28/2014    Past Surgical History  Procedure Laterality Date  . Cesarean section    . Cataract extraction Bilateral 1980  . Corneal transplant Left  [facility]-Treatment for blindness.    Family History  Problem Relation Age of Onset  . Diabetes Mother   . Hypertension Mother   . Alcohol abuse Father     Social History   Social History  . Marital Status: Single    Spouse Name: N/A  . Number of Children: N/A  . Years of Education: N/A   Occupational History  . Not on file.   Social History Main Topics  . Smoking status: Former Smoker --  1.00 packs/day for 30 years    Types: Cigarettes    Start date: 01/07/1985    Quit date: 08/07/2015  . Smokeless tobacco: Never Used  . Alcohol Use: No  . Drug Use: No  . Sexual Activity: No   Other Topics Concern  . Not on file   Social History Narrative     Current outpatient prescriptions:  .  aspirin 81 MG chewable tablet, Chew 1 tablet by mouth daily., Disp: , Rfl:  .  cyclobenzaprine (FLEXERIL) 5 MG tablet, Take 1 tablet (5 mg total) by mouth at bedtime., Disp: 30 tablet, Rfl: 2 .  empagliflozin (JARDIANCE) 25 MG TABS tablet, Take 25 mg by mouth daily., Disp: 30 tablet, Rfl: 3 .  fluticasone furoate-vilanterol (BREO ELLIPTA) 100-25 MCG/INH AEPB, Inhale 1 puff into the lungs daily., Disp: 28 each, Rfl: 2 .  folic acid (FOLVITE) 1 MG tablet, Take 1 mg by mouth., Disp: , Rfl:  .  gabapentin (NEURONTIN) 300 MG capsule, Take 1 capsule (300 mg total) by mouth 3 (three) times daily., Disp: 90 capsule, Rfl: 5 .  HYDROcodone-acetaminophen (NORCO/VICODIN) 5-325 MG tablet, Take 2 tablets by mouth 2 (two) times daily., Disp: 60 tablet, Rfl: 0 .  Ipratropium-Albuterol (COMBIVENT RESPIMAT) 20-100 MCG/ACT AERS respimat, Inhale 1 puff into the lungs 4 (four) times daily., Disp: 1 Inhaler, Rfl: 2 .  lisinopril (PRINIVIL,ZESTRIL) 5 MG tablet, Take 1 tablet (5 mg total) by mouth daily., Disp: 30 tablet, Rfl: 5 .  methotrexate (RHEUMATREX) 2.5 MG tablet, Take 15 mg by mouth., Disp: , Rfl:  .  rOPINIRole (REQUIP) 1 MG tablet, Take 1 tablet (1 mg total) by mouth daily., Disp: 30 tablet, Rfl: 5 .  simvastatin (ZOCOR) 10 MG tablet, Take 1 tablet (10 mg total) by mouth daily., Disp: 30 tablet, Rfl: 5 .  sitaGLIPtin-metformin (JANUMET) 50-1000 MG tablet, Take 1 tablet by mouth 2 (two) times daily., Disp: 60 tablet, Rfl: 5 .  triamcinolone cream (KENALOG) 0.1 %, TRIAMCINOLONE ACETONIDE, 0.1% (External Cream)  use per dermatologist. for 0 days  Quantity: 1.00;  Refills: 0   Ordered :15-Jun-2010  Alba CorySOWLES,  Sidharth Leverette MD;  Started 02-Apr-2008 Active Comments: DX: 724.2, Disp: , Rfl:   Allergies  Allergen Reactions  . Penicillins      ROS  Ten systems reviewed and is negative except as mentioned in HPI  Weight loss 10 lbs in the past 3 months, without diet, still has rash but stable  Objective  Filed Vitals:   12/13/15 1104  BP: 100/60  Pulse: 109  Temp: 98.4 F (36.9 C)  TempSrc: Oral  Resp: 18  Weight: 178 lb 6.4 oz (80.922 kg)  SpO2: 95%    Body mass index is 34.84 kg/(m^2).  Physical Exam  Constitutional: Patient appears well-developed and well-nourished. Obese  No distress.  HEENT: head atraumatic, normocephalic, pupils equal and reactive to light,  neck supple, throat within normal limits Cardiovascular: Normal rate, regular rhythm and normal heart sounds.  No murmur heard. No BLE edema. Pulmonary/Chest:  Effort normal and breath sounds normal. No respiratory distress. Abdominal: Soft.  There is no tenderness. Psychiatric: Patient has a normal mood and affect. behavior is normal. Judgment and thought content normal. Neurological: normal neck exam, normal grip, normal sensation during exam, Romberg negative Skin: dry and has hypopigmentation from eczema  Recent Results (from the past 2160 hour(s))  POCT glycosylated hemoglobin (Hb A1C)     Status: Abnormal   Collection Time: 12/13/15 11:06 AM  Result Value Ref Range   Hemoglobin A1C 7.5   POCT UA - Microalbumin     Status: Normal   Collection Time: 12/13/15 11:07 AM  Result Value Ref Range   Microalbumin Ur, POC 20 mg/L   Creatinine, POC  mg/dL   Albumin/Creatinine Ratio, Urine, POC       PHQ2/9: Depression screen [facility] 2/9 12/13/2015 08/12/2015 05/06/2015 04/01/2015 01/08/2015  Decreased Interest 0 0 0 0 0  Down, Depressed, Hopeless 0 0 0 0 0  PHQ - 2 Score 0 0 0 0 0     Fall Risk: Fall Risk  12/13/2015 08/12/2015 05/06/2015 04/01/2015 01/08/2015  Falls in the past year? No No No No No     Functional Status  Survey: Is the patient deaf or have difficulty hearing?: No Does the patient have difficulty seeing, even when wearing glasses/contacts?: No Does the patient have difficulty concentrating, remembering, or making decisions?: No Does the patient have difficulty walking or climbing stairs?: No Does the patient have difficulty dressing or bathing?: No Does the patient have difficulty doing errands alone such as visiting a doctor's office or shopping?: Yes    Assessment & Plan  1. Type 2 diabetes mellitus with other diabetic kidney complication (HCC)  She needs to resume a diabetic diet, refuses adding medication - POCT glycosylated hemoglobin (Hb A1C) - POCT UA - Microalbumin - sitaGLIPtin-metformin (JANUMET) 50-1000 MG tablet; Take 1 tablet by mouth 2 (two) times daily.  Dispense: 60 tablet; Refill: 5  2. RLS (restless legs syndrome)  - rOPINIRole (REQUIP) 1 MG tablet; Take 1 tablet (1 mg total) by mouth daily.  Dispense: 30 tablet; Refill: 5  3. Chronic LBP  - gabapentin (NEURONTIN) 300 MG capsule; Take 1 capsule (300 mg total) by mouth 3 (three) times daily.  Dispense: 90 capsule; Refill: 5 - HYDROcodone-acetaminophen (NORCO/VICODIN) 5-325 MG tablet; Take 2 tablets by mouth 2 (two) times daily.  Dispense: 60 tablet; Refill: 0 - cyclobenzaprine (FLEXERIL) 5 MG tablet; Take 1 tablet (5 mg total) by mouth at bedtime.  Dispense: 30 tablet; Refill: 2  4. COPD with bronchitis  She needs to quit smoking - fluticasone furoate-vilanterol (BREO ELLIPTA) 100-25 MCG/INH AEPB; Inhale 1 puff into the lungs daily.  Dispense: 28 each; Refill: 2  5. Neurogenic claudication  - HYDROcodone-acetaminophen (NORCO/VICODIN) 5-325 MG tablet; Take 2 tablets by mouth 2 (two) times daily.  Dispense: 60 tablet; Refill: 0  6. Encounter for screening mammogram for breast cancer  - MM Digital Screening; Future   7. AD (atopic dermatitis)  Doing well now, keep follow up with Dermatologist  8.  Paresthesia  - Ambulatory referral to Neurology  9. Obesity (BMI 30.0-34.9)  Discussed with the patient the risk posed by an increased BMI. Discussed importance of portion control, calorie counting and at least 150 minutes of physical activity weekly. Avoid sweet beverages and drink more water. Eat at least 6 servings of fruit and vegetables daily   10. Weight loss  - CBC with Differential/Platelet - Comprehensive metabolic panel - TSH -  HIV antibody

## 2015-12-15 ENCOUNTER — Other Ambulatory Visit: Payer: Self-pay

## 2015-12-15 DIAGNOSIS — E785 Hyperlipidemia, unspecified: Secondary | ICD-10-CM

## 2015-12-15 DIAGNOSIS — E1129 Type 2 diabetes mellitus with other diabetic kidney complication: Secondary | ICD-10-CM

## 2015-12-15 DIAGNOSIS — R809 Proteinuria, unspecified: Secondary | ICD-10-CM

## 2015-12-15 NOTE — Telephone Encounter (Addendum)
Got a fax from [facility] requesting a refill of this patient's Simvastatin 10mg  & Lisinopril 5mg . Patient is requesting a 90-day supply.  Refill request was sent to Dr. Alba CoryKrichna Sowles for approval and submission.

## 2015-12-22 ENCOUNTER — Ambulatory Visit
Admission: RE | Admit: 2015-12-22 | Discharge: 2015-12-22 | Disposition: A | Discharge status: Home / Self Care | Payer: Medicare Other | Source: Ambulatory Visit | Attending: Family Medicine | Admitting: Family Medicine

## 2015-12-22 ENCOUNTER — Other Ambulatory Visit: Payer: Self-pay | Admitting: Family Medicine

## 2015-12-22 DIAGNOSIS — Z1231 Encounter for screening mammogram for malignant neoplasm of breast: Secondary | ICD-10-CM

## 2016-01-09 ENCOUNTER — Emergency Department
Admission: EM | Admit: 2016-01-09 | Discharge: 2016-01-09 | Disposition: A | Discharge status: Home / Self Care | Payer: Medicare Other | Attending: Emergency Medicine | Admitting: Emergency Medicine

## 2016-01-09 ENCOUNTER — Encounter: Payer: Self-pay | Admitting: Emergency Medicine

## 2016-01-09 ENCOUNTER — Emergency Department: Payer: Medicare Other

## 2016-01-09 DIAGNOSIS — R209 Unspecified disturbances of skin sensation: Secondary | ICD-10-CM | POA: Diagnosis not present

## 2016-01-09 DIAGNOSIS — I10 Essential (primary) hypertension: Secondary | ICD-10-CM | POA: Insufficient documentation

## 2016-01-09 DIAGNOSIS — E119 Type 2 diabetes mellitus without complications: Secondary | ICD-10-CM | POA: Insufficient documentation

## 2016-01-09 DIAGNOSIS — R202 Paresthesia of skin: Secondary | ICD-10-CM

## 2016-01-09 DIAGNOSIS — Z7984 Long term (current) use of oral hypoglycemic drugs: Secondary | ICD-10-CM | POA: Insufficient documentation

## 2016-01-09 DIAGNOSIS — F17210 Nicotine dependence, cigarettes, uncomplicated: Secondary | ICD-10-CM | POA: Diagnosis not present

## 2016-01-09 DIAGNOSIS — R200 Anesthesia of skin: Secondary | ICD-10-CM | POA: Diagnosis present

## 2016-01-09 LAB — COMPREHENSIVE METABOLIC PANEL
ALT: 13 U/L — AB (ref 14–54)
AST: 15 U/L (ref 15–41)
Albumin: 3.9 g/dL (ref 3.5–5.0)
Alkaline Phosphatase: 77 U/L (ref 38–126)
Anion gap: 6 (ref 5–15)
BILIRUBIN TOTAL: 0.6 mg/dL (ref 0.3–1.2)
BUN: 8 mg/dL (ref 6–20)
CALCIUM: 9.3 mg/dL (ref 8.9–10.3)
CO2: 26 mmol/L (ref 22–32)
CREATININE: 0.71 mg/dL (ref 0.44–1.00)
Chloride: 107 mmol/L (ref 101–111)
GFR calc Af Amer: >60 mL/min (ref >60)
Glucose, Bld: 117 mg/dL — ABNORMAL HIGH (ref 65–99)
Potassium: 3.9 mmol/L (ref 3.5–5.1)
Sodium: 139 mmol/L (ref 135–145)
TOTAL PROTEIN: 8 g/dL (ref 6.5–8.1)

## 2016-01-09 LAB — CBC WITH DIFFERENTIAL/PLATELET
BASOS ABS: 0.1 10*3/uL (ref 0–0.1)
Basophils Relative: 1 %
Eosinophils Absolute: 0.2 10*3/uL (ref 0–0.7)
Eosinophils Relative: 3 %
HEMATOCRIT: 45.1 % (ref 35.0–47.0)
Hemoglobin: 16 g/dL (ref 12.0–16.0)
LYMPHS ABS: 2.4 10*3/uL (ref 1.0–3.6)
LYMPHS PCT: 29 %
MCH: 32.8 pg (ref 26.0–34.0)
MCHC: 35.3 g/dL (ref 32.0–36.0)
MCV: 92.8 fL (ref 80.0–100.0)
MONO ABS: 0.4 10*3/uL (ref 0.2–0.9)
Monocytes Relative: 5 %
NEUTROS ABS: 5 10*3/uL (ref 1.4–6.5)
Neutrophils Relative %: 62 %
Platelets: 264 10*3/uL (ref 150–440)
RBC: 4.87 MIL/uL (ref 3.80–5.20)
RDW: 14.4 % (ref 11.5–14.5)
WBC: 8.1 10*3/uL (ref 3.6–11.0)

## 2016-01-09 LAB — TROPONIN I

## 2016-01-09 NOTE — ED Triage Notes (Signed)
Pt ambulatory in triage without difficulty. Denies any slurred speech.

## 2016-01-09 NOTE — ED Notes (Signed)
Pt verbalized understanding of discharge instructions. NAD at this time. 

## 2016-01-09 NOTE — ED Provider Notes (Signed)
[facility] Emergency Department Provider Note    ____________________________________________   I have reviewed the triage vital signs and the nursing notes.   HISTORY  Chief Complaint Numbness   History limited by: Not Limited   HPI Julie Wall is a 60 y.o. female who presents to the emergency department today because of concerns for intermittent left arm numbness.The patient states that this numbness has been going on for the past few months. It will happen maybe a couple of times a day. It will last for about 15 seconds. She feels like the whole left arm goes numb. By this she means it feels like pins and needles. The patient denies any chest pain with these events. Denies any pattern to them. Comes in today because family insisted she did. No recent fevers.    Past Medical History:  Diagnosis Date  . Allergy   . ASCUS favor benign 09/2013   negative HPV  . Atopic dermatitis    Dermatologist at [facility]  . Chronic low back pain   . Decreased exercise tolerance   . Dental caries   . Diabetes mellitus without complication (HCC)   . GERD (gastroesophageal reflux disease)   . Hypertension   . Lumbosacral neuritis   . Microalbuminuria   . Osteopenia   . Ovarian failure   . Tobacco use     Patient Active Problem List   Diagnosis Date Noted  . Obesity (BMI 30.0-34.9) 12/13/2015  . Chronic bronchitis (HCC) 05/14/2015  . DM type 2 with diabetic peripheral neuropathy (HCC) 05/14/2015  . Positive H. pylori test 04/05/2015  . Umbilical hernia 04/01/2015  . RLS (restless legs syndrome) 01/08/2015  . Type 2 diabetes mellitus with other diabetic kidney complication (HCC) 01/08/2015  . Neurogenic claudication 01/08/2015  . AD (atopic dermatitis) 12/28/2014  . Chronic LBP 12/28/2014  . Dyslipidemia 12/28/2014  . Gastro-esophageal reflux disease without esophagitis 12/28/2014  . Microalbuminuria 12/28/2014  . Disorder of bone and cartilage 12/28/2014  .  Obesity (BMI 30-39.9) 12/28/2014  . Neuralgia of left thigh 12/28/2014  . Perennial allergic rhinitis with seasonal variation 12/28/2014  . Tobacco abuse 12/28/2014    Past Surgical History:  Procedure Laterality Date  . CATARACT EXTRACTION Bilateral 1980  . CESAREAN SECTION    . CORNEAL TRANSPLANT Left    [facility]-Treatment for blindness.    Prior to Admission medications   Medication Sig Start Date End Date Taking? Authorizing Provider  aspirin 81 MG chewable tablet Chew 1 tablet by mouth daily. 04/08/09   Historical Provider, MD  cyclobenzaprine (FLEXERIL) 5 MG tablet Take 1 tablet (5 mg total) by mouth at bedtime. 12/13/15   Alba Cory, MD  empagliflozin (JARDIANCE) 25 MG TABS tablet Take 25 mg by mouth daily. 12/13/15   Alba Cory, MD  fluticasone furoate-vilanterol (BREO ELLIPTA) 100-25 MCG/INH AEPB Inhale 1 puff into the lungs daily. 12/13/15   Alba Cory, MD  folic acid (FOLVITE) 1 MG tablet Take 1 mg by mouth. 05/18/15 05/17/16  Historical Provider, MD  gabapentin (NEURONTIN) 300 MG capsule Take 1 capsule (300 mg total) by mouth 3 (three) times daily. 12/13/15   Alba Cory, MD  HYDROcodone-acetaminophen (NORCO/VICODIN) 5-325 MG tablet Take 2 tablets by mouth 2 (two) times daily. 12/13/15   Alba Cory, MD  Ipratropium-Albuterol (COMBIVENT RESPIMAT) 20-100 MCG/ACT AERS respimat Inhale 1 puff into the lungs 4 (four) times daily. 04/01/15   Alba Cory, MD  lisinopril (PRINIVIL,ZESTRIL) 5 MG tablet Take 1 tablet (5 mg total) by mouth daily.  11/16/15   Alba CoryKrichna Sowles, MD  methotrexate (RHEUMATREX) 2.5 MG tablet Take 15 mg by mouth. 07/09/15   Historical Provider, MD  rOPINIRole (REQUIP) 1 MG tablet Take 1 tablet (1 mg total) by mouth daily. 12/13/15   Alba CoryKrichna Sowles, MD  simvastatin (ZOCOR) 10 MG tablet Take 1 tablet (10 mg total) by mouth daily. 11/16/15   Alba CoryKrichna Sowles, MD  sitaGLIPtin-metformin (JANUMET) 50-1000 MG tablet Take 1 tablet by mouth 2 (two) times daily. 12/13/15    Alba CoryKrichna Sowles, MD  triamcinolone cream (KENALOG) 0.1 % TRIAMCINOLONE ACETONIDE, 0.1% (External Cream)  use per dermatologist. for 0 days  Quantity: 1.00;  Refills: 0   Ordered :15-Jun-2010  Alba CorySOWLES, KRICHNA MD;  Started 02-Apr-2008 Active Comments: DX: 724.2 04/02/08   Historical Provider, MD    Allergies Penicillins  Family History  Problem Relation Age of Onset  . Diabetes Mother   . Hypertension Mother   . Alcohol abuse Father   . Breast cancer Neg Hx     Social History Social History  Substance Use Topics  . Smoking status: Current Every Day Smoker    Packs/day: 0.50    Years: 30.00    Types: Cigarettes    Start date: 01/07/1985  . Smokeless tobacco: Never Used  . Alcohol use No    Review of Systems  Constitutional: Negative for fever. Cardiovascular: Negative for chest pain. Respiratory: Negative for shortness of breath. Gastrointestinal: Negative for abdominal pain, vomiting and diarrhea. Neurological: Positive for left arm pins and needles.   10-point ROS otherwise negative.  ____________________________________________   PHYSICAL EXAM:  VITAL SIGNS: ED Triage Vitals  Enc Vitals Group     BP 01/09/16 1351 124/76     Pulse Rate 01/09/16 1351 96     Resp 01/09/16 1351 18     Temp 01/09/16 1351 98.4 F (36.9 C)     Temp Source 01/09/16 1351 Oral     SpO2 01/09/16 1351 96 %     Weight 01/09/16 1351 174 lb (78.9 kg)     Height 01/09/16 1351 4\' 11"  (1.499 m)     Head Circumference --      Peak Flow --      Pain Score 01/09/16 1352 0   Constitutional: Alert and oriented. Well appearing and in no distress. Eyes: Conjunctivae are normal. PERRL. Normal extraocular movements. ENT   Head: Normocephalic and atraumatic.   Nose: No congestion/rhinnorhea.   Mouth/Throat: Mucous membranes are moist.   Neck: No stridor. Hematological/Lymphatic/Immunilogical: No cervical lymphadenopathy. Cardiovascular: Normal rate, regular rhythm.  No murmurs,  rubs, or gallops. Respiratory: Normal respiratory effort without tachypnea nor retractions. Breath sounds are clear and equal bilaterally. No wheezes/rales/rhonchi. Gastrointestinal: Soft and nontender. No distention. There is no CVA tenderness. Genitourinary: Deferred Musculoskeletal: Normal range of motion in all extremities. No joint effusions.  No lower extremity tenderness nor edema. Neurologic:  Normal speech and language. No gross focal neurologic deficits are appreciated.  Skin:  Skin is warm, dry and intact. No rash noted. Psychiatric: Mood and affect are normal. Speech and behavior are normal. Patient exhibits appropriate insight and judgment.  ____________________________________________    LABS (pertinent positives/negatives)  Labs Reviewed  COMPREHENSIVE METABOLIC PANEL - Abnormal; Notable for the following:       Result Value   Glucose, Bld 117 (*)    ALT 13 (*)    All other components within normal limits  CBC WITH DIFFERENTIAL/PLATELET  TROPONIN I  URINALYSIS COMPLETEWITH MICROSCOPIC ([facility] ONLY)     ____________________________________________  EKG  I, Phineas Semen, attending physician, personally viewed and interpreted this EKG  EKG Time: 1401 Rate: 96 Rhythm: normal sinus rhythm Axis: normal Intervals: qtc 428 QRS: narrow, q waves V1 ST changes: no st elevation Impression: abnormal ekg   ____________________________________________    RADIOLOGY  CT head IMPRESSION: 1. No acute intracranial abnormality. No intracranial mass, hemorrhage or edema. 2. Masslike structure within the subcutaneous soft tissues posterior to the midline skullbase, measuring approximately 3.3 x 3.2 cm. This is of uncertain etiology. Neoplastic mass is not excluded. Hematoma or complex sebaceous cyst? Additional ill-defined soft tissue thickening/edema underlying the skin surfaces to the right and left of this dominant mass. As a neoplastic process cannot be  excluded, consider ultrasound or MRI for further characterization.  I, Phineas Semen, personally discussed these images and results by phone with the on-call radiologist and used this discussion as part of my medical decision making.   ____________________________________________   PROCEDURES  Procedures  ____________________________________________   INITIAL IMPRESSION / ASSESSMENT AND PLAN / ED COURSE  Pertinent labs & imaging results that were available during my care of the patient were reviewed by me and considered in my medical decision making (see chart for details).  Patient presented to the emergency department today because of intermittent episodes of left arm numbness is been going on for months. She describes it as pins and needles. CT head did not show any intracranial concerning findings. There was a mass seen at the base of her neck. When I discussed this with the patient she has been aware of this "knot". She has seen her dermatologist which is aware of it. It does not however sound like she has had any advanced imaging of it. I did recommend that she talk to her primary care doctor about obtaining either an ultrasound or an MRI. Terms of the arm numbness do not think this is a central cause. I think paresthesia radiculopathy likely. ____________________________________________   FINAL CLINICAL IMPRESSION(S) / ED DIAGNOSES  Final diagnoses:  Paresthesia     Note: This dictation was prepared with Dragon dictation. Any transcriptional errors that result from this process are unintentional    Phineas Semen, MD 01/09/16 [phone]

## 2016-01-09 NOTE — Discharge Instructions (Signed)
Please seek medical attention for any high fevers, chest pain, shortness of breath, change in behavior, persistent vomiting, bloody stool or any other new or concerning symptoms.  

## 2016-01-09 NOTE — ED Triage Notes (Signed)
Pt states she gets numbness in her left arm that comes and goes for the past month.  Pt states when it comes on she gets dizzy at times and almost passes out.  C/o left side pain and feeling of a knot on that side and is difficult to get comfortable when lying down.

## 2016-01-17 ENCOUNTER — Other Ambulatory Visit: Payer: Self-pay

## 2016-01-17 DIAGNOSIS — R809 Proteinuria, unspecified: Secondary | ICD-10-CM

## 2016-01-17 DIAGNOSIS — E1129 Type 2 diabetes mellitus with other diabetic kidney complication: Secondary | ICD-10-CM

## 2016-01-17 MED ORDER — LISINOPRIL 5 MG PO TABS: 5.0000 mg | ORAL_TABLET | Freq: Every day | ORAL | 5 refills | Status: DC

## 2016-01-17 NOTE — Telephone Encounter (Signed)
Patient requesting refill of Lisinopril.

## 2016-02-09 ENCOUNTER — Encounter: Payer: Self-pay | Admitting: Family Medicine

## 2016-02-09 ENCOUNTER — Ambulatory Visit (INDEPENDENT_AMBULATORY_CARE_PROVIDER_SITE_OTHER): Payer: Medicare Other | Admitting: Family Medicine

## 2016-02-09 DIAGNOSIS — R062 Wheezing: Secondary | ICD-10-CM | POA: Diagnosis not present

## 2016-02-09 DIAGNOSIS — J441 Chronic obstructive pulmonary disease with (acute) exacerbation: Secondary | ICD-10-CM | POA: Diagnosis not present

## 2016-02-09 MED ORDER — GUAIFENESIN-CODEINE 100-10 MG/5ML PO SYRP: 10.0000 mL | ORAL_SOLUTION | Freq: Four times a day (QID) | ORAL | 0 refills | Status: DC | PRN

## 2016-02-09 MED ORDER — AZITHROMYCIN 250 MG PO TABS: ORAL_TABLET | ORAL | 0 refills | Status: DC

## 2016-02-09 MED ORDER — IPRATROPIUM-ALBUTEROL 20-100 MCG/ACT IN AERS: 1.0000 | INHALATION_SPRAY | Freq: Four times a day (QID) | RESPIRATORY_TRACT | 2 refills | Status: DC

## 2016-02-09 NOTE — Progress Notes (Signed)
Name: Julie Wall   MRN: [id number]    DOB: 12/27/1955   Date:02/09/2016       Progress Note  Subjective  Chief Complaint  Chief Complaint  Patient presents with  . Cough    since Monday  . Nasal Congestion    and chest congestion  . Fatigue  This pt. Is usually followed by Dr. Carlynn Wall, new to me.  Cough  This is a new problem. The current episode started yesterday. The cough is non-productive. Associated symptoms include nasal congestion and shortness of breath. Pertinent negatives include no chest pain, chills, fever, rhinorrhea or sore throat. She has tried nothing for the symptoms. Her past medical history is significant for bronchitis and COPD.    Past Medical History:  Diagnosis Date  . Allergy   . ASCUS favor benign 09/2013   negative HPV  . Atopic dermatitis    [facility]  . Chronic low back pain   . Decreased exercise tolerance   . Dental caries   . Diabetes mellitus without complication (HCC)   . GERD (gastroesophageal reflux disease)   . Hypertension   . Lumbosacral neuritis   . Microalbuminuria   . Osteopenia   . Ovarian failure   . Tobacco use     Past Surgical History:  Procedure Laterality Date  . CATARACT EXTRACTION Bilateral 1980  . CESAREAN SECTION    . CORNEAL TRANSPLANT Left    [facility]-Treatment for blindness.    Family History  Problem Relation Age of Onset  . Diabetes Mother   . Hypertension Mother   . Alcohol abuse Father   . Breast cancer Neg Hx     Social History   Social History  . Marital status: Single    Spouse name: N/A  . Number of children: N/A  . Years of education: N/A   Occupational History  . Not on file.   Social History Main Topics  . Smoking status: Current Every Day Smoker    Packs/day: 0.50    Years: 30.00    Types: Cigarettes    Start date: 01/07/1985  . Smokeless tobacco: Never Used     Comment: trying to stop  . Alcohol use No  . Drug use: No  . Sexual activity: No   Other Topics Concern  . Not  on file   Social History Narrative  . No narrative on file     Current Outpatient Prescriptions:  .  aspirin 81 MG chewable tablet, Chew 1 tablet by mouth daily., Disp: , Rfl:  .  cyclobenzaprine (FLEXERIL) 5 MG tablet, Take 1 tablet (5 mg total) by mouth at bedtime., Disp: 30 tablet, Rfl: 2 .  empagliflozin (JARDIANCE) 25 MG TABS tablet, Take 25 mg by mouth daily., Disp: 30 tablet, Rfl: 3 .  fluticasone furoate-vilanterol (BREO ELLIPTA) 100-25 MCG/INH AEPB, Inhale 1 puff into the lungs daily., Disp: 28 each, Rfl: 2 .  folic acid (FOLVITE) 1 MG tablet, Take 1 mg by mouth., Disp: , Rfl:  .  gabapentin (NEURONTIN) 300 MG capsule, Take 1 capsule (300 mg total) by mouth 3 (three) times daily., Disp: 90 capsule, Rfl: 5 .  HYDROcodone-acetaminophen (NORCO/VICODIN) 5-325 MG tablet, Take 2 tablets by mouth 2 (two) times daily., Disp: 60 tablet, Rfl: 0 .  Ipratropium-Albuterol (COMBIVENT RESPIMAT) 20-100 MCG/ACT AERS respimat, Inhale 1 puff into the lungs 4 (four) times daily., Disp: 1 Inhaler, Rfl: 2 .  lisinopril (PRINIVIL,ZESTRIL) 5 MG tablet, Take 1 tablet (5 mg total) by mouth daily., Disp:  30 tablet, Rfl: 5 .  methotrexate (RHEUMATREX) 2.5 MG tablet, Take 15 mg by mouth., Disp: , Rfl:  .  rOPINIRole (REQUIP) 1 MG tablet, Take 1 tablet (1 mg total) by mouth daily., Disp: 30 tablet, Rfl: 5 .  simvastatin (ZOCOR) 10 MG tablet, Take 1 tablet (10 mg total) by mouth daily., Disp: 30 tablet, Rfl: 5 .  sitaGLIPtin-metformin (JANUMET) 50-1000 MG tablet, Take 1 tablet by mouth 2 (two) times daily., Disp: 60 tablet, Rfl: 5 .  triamcinolone cream (KENALOG) 0.1 %, TRIAMCINOLONE ACETONIDE, 0.1% (External Cream)  use per dermatologist. for 0 days  Quantity: 1.00;  Refills: 0   Ordered :15-Jun-2010  Julie Wall;  Started 02-Apr-2008 Active Comments: DX: 724.2, Disp: , Rfl:   Allergies  Allergen Reactions  . Penicillins      Review of Systems  Constitutional: Negative for chills and fever.  HENT:  Negative for rhinorrhea and sore throat.   Respiratory: Positive for cough and shortness of breath.   Cardiovascular: Negative for chest pain.    Objective  Vitals:   02/09/16 1607  BP: 103/64  Pulse: (!) 107  Resp: 16  Temp: 98.5 F (36.9 C)  SpO2: 95%  Weight: 177 lb 2 oz (80.3 kg)  Height: 4\' 11"  (1.499 m)    Physical Exam  Constitutional: She is well-developed, well-nourished, and in no distress.  HENT:  Head: Normocephalic and atraumatic.  Right Ear: Tympanic membrane and ear canal normal.  Left Ear: Tympanic membrane and ear canal normal.  Nose: Right sinus exhibits no maxillary sinus tenderness and no frontal sinus tenderness. Left sinus exhibits no maxillary sinus tenderness and no frontal sinus tenderness.  Mouth/Throat: Posterior oropharyngeal erythema present.  Cardiovascular: Regular rhythm, S1 normal and S2 normal.  Tachycardia present.   No murmur heard. Pulmonary/Chest: Effort normal. No respiratory distress. She has wheezes in the right lower field, the left middle field and the left lower field.  Musculoskeletal:       Right ankle: She exhibits no swelling.       Left ankle: She exhibits swelling.  Nursing note and vitals reviewed.     Assessment & Plan 1. COPD with acute exacerbation (HCC) Breathing and wheezing improved after a nebulization treatment, encouraged to use Combivent 4 times a day, started on Z-Pak, prednisone not indicated because of poorly controlled diabetes. Obtain chest x-ray to rule out any acute abnormality. - azithromycin (ZITHROMAX) 250 MG tablet; 2 tabs po day 1, then 1 tab po q day x 4 days  Dispense: 6 tablet; Refill: 0 - Ipratropium-Albuterol (COMBIVENT RESPIMAT) 20-100 MCG/ACT AERS respimat; Inhale 1 puff into the lungs 4 (four) times daily.  Dispense: 1 Inhaler; Refill: 2 - guaiFENesin-codeine (CHERATUSSIN AC) 100-10 MG/5ML syrup; Take 10 mLs by mouth 4 (four) times daily as needed for cough.  Dispense: 200 mL; Refill: 0 - DG  Chest 2 View; Future  2. Wheezing  - albuterol (PROVENTIL) (2.5 MG/3ML) 0.083% nebulizer solution 2.5 mg; Take 3 mLs (2.5 mg total) by nebulization once.   [name] Julie Wall [facility] 02/09/2016 4:34 PM

## 2016-02-10 ENCOUNTER — Ambulatory Visit
Admission: RE | Admit: 2016-02-10 | Discharge: 2016-02-10 | Disposition: A | Discharge status: Home / Self Care | Payer: Medicare Other | Source: Ambulatory Visit | Attending: Family Medicine | Admitting: Family Medicine

## 2016-02-10 DIAGNOSIS — R062 Wheezing: Secondary | ICD-10-CM | POA: Insufficient documentation

## 2016-02-10 DIAGNOSIS — R05 Cough: Secondary | ICD-10-CM | POA: Diagnosis present

## 2016-02-10 DIAGNOSIS — J441 Chronic obstructive pulmonary disease with (acute) exacerbation: Secondary | ICD-10-CM | POA: Insufficient documentation

## 2016-02-10 MED ORDER — ALBUTEROL SULFATE (2.5 MG/3ML) 0.083% IN NEBU: 2.5000 mg | INHALATION_SOLUTION | Freq: Four times a day (QID) | RESPIRATORY_TRACT | 1 refills | Status: DC | PRN

## 2016-02-10 MED ORDER — ALBUTEROL SULFATE (2.5 MG/3ML) 0.083% IN NEBU: 2.5000 mg | INHALATION_SOLUTION | Freq: Once | RESPIRATORY_TRACT | Status: DC

## 2016-03-14 ENCOUNTER — Ambulatory Visit (INDEPENDENT_AMBULATORY_CARE_PROVIDER_SITE_OTHER): Payer: Medicare Other | Admitting: Family Medicine

## 2016-03-14 ENCOUNTER — Encounter: Payer: Self-pay | Admitting: Family Medicine

## 2016-03-14 VITALS — BP 104/60 | HR 102 | Temp 98.8°F | Resp 16 | Ht 59.0 in | Wt 178.0 lb

## 2016-03-14 DIAGNOSIS — R946 Abnormal results of thyroid function studies: Secondary | ICD-10-CM

## 2016-03-14 DIAGNOSIS — J441 Chronic obstructive pulmonary disease with (acute) exacerbation: Secondary | ICD-10-CM | POA: Diagnosis not present

## 2016-03-14 DIAGNOSIS — E1129 Type 2 diabetes mellitus with other diabetic kidney complication: Secondary | ICD-10-CM | POA: Diagnosis not present

## 2016-03-14 DIAGNOSIS — E1142 Type 2 diabetes mellitus with diabetic polyneuropathy: Secondary | ICD-10-CM | POA: Diagnosis not present

## 2016-03-14 DIAGNOSIS — G8929 Other chronic pain: Secondary | ICD-10-CM

## 2016-03-14 DIAGNOSIS — G2581 Restless legs syndrome: Secondary | ICD-10-CM

## 2016-03-14 DIAGNOSIS — R809 Proteinuria, unspecified: Secondary | ICD-10-CM | POA: Diagnosis not present

## 2016-03-14 DIAGNOSIS — M48062 Spinal stenosis, lumbar region with neurogenic claudication: Secondary | ICD-10-CM | POA: Diagnosis not present

## 2016-03-14 DIAGNOSIS — M545 Low back pain: Secondary | ICD-10-CM

## 2016-03-14 DIAGNOSIS — E785 Hyperlipidemia, unspecified: Secondary | ICD-10-CM

## 2016-03-14 DIAGNOSIS — Z23 Encounter for immunization: Secondary | ICD-10-CM

## 2016-03-14 DIAGNOSIS — G9519 Other vascular myelopathies: Secondary | ICD-10-CM

## 2016-03-14 LAB — POCT GLYCOSYLATED HEMOGLOBIN (HGB A1C): HEMOGLOBIN A1C: 6.9

## 2016-03-14 MED ORDER — LISINOPRIL 5 MG PO TABS: 5.0000 mg | ORAL_TABLET | Freq: Every day | ORAL | 1 refills | Status: DC

## 2016-03-14 MED ORDER — ROPINIROLE HCL 1 MG PO TABS: 1.0000 mg | ORAL_TABLET | Freq: Every day | ORAL | 1 refills | Status: DC

## 2016-03-14 MED ORDER — HYDROCODONE-ACETAMINOPHEN 5-325 MG PO TABS: 2.0000 | ORAL_TABLET | Freq: Two times a day (BID) | ORAL | 0 refills | Status: DC | PRN

## 2016-03-14 MED ORDER — DEXTROMETHORPHAN-GUAIFENESIN 10-100 MG/5ML PO SYRP: 5.0000 mL | ORAL_SOLUTION | Freq: Two times a day (BID) | ORAL | 0 refills | Status: DC

## 2016-03-14 MED ORDER — PREDNISONE 10 MG PO TABS: 10.0000 mg | ORAL_TABLET | Freq: Two times a day (BID) | ORAL | 0 refills | Status: DC

## 2016-03-14 MED ORDER — EMPAGLIFLOZIN 25 MG PO TABS: 25.0000 mg | ORAL_TABLET | Freq: Every day | ORAL | 1 refills | Status: DC

## 2016-03-14 MED ORDER — ATORVASTATIN CALCIUM 40 MG PO TABS: 40.0000 mg | ORAL_TABLET | Freq: Every day | ORAL | 1 refills | Status: DC

## 2016-03-14 MED ORDER — CYCLOBENZAPRINE HCL 5 MG PO TABS: 5.0000 mg | ORAL_TABLET | Freq: Every day | ORAL | 2 refills | Status: DC

## 2016-03-14 MED ORDER — GABAPENTIN 300 MG PO CAPS: 300.0000 mg | ORAL_CAPSULE | Freq: Three times a day (TID) | ORAL | 1 refills | Status: DC

## 2016-03-14 MED ORDER — SITAGLIPTIN PHOS-METFORMIN HCL 50-1000 MG PO TABS: 1.0000 | ORAL_TABLET | Freq: Two times a day (BID) | ORAL | 1 refills | Status: DC

## 2016-03-14 MED ORDER — UMECLIDINIUM-VILANTEROL 62.5-25 MCG/INH IN AEPB
1.0000 | INHALATION_SPRAY | Freq: Every day | RESPIRATORY_TRACT | 0 refills | Status: DC
Start: 2016-03-14 — End: 2016-09-13

## 2016-03-14 NOTE — Progress Notes (Addendum)
Name: Julie Wall   MRN: [id number]    DOB: 1955-12-19   Date:03/14/2016       Progress Note  Subjective  Chief Complaint  Chief Complaint  Patient presents with  . Diabetes    3 month follow up pt not checking BS due to not having any strips unsure of type of machine she has  . Cough    for 1 week usually dry and unproductive causing sore throat due to coughing so much taking OTC meds not helping  . Flu Vaccine  . Hyperlipidemia  . Gastroesophageal Reflux  . COPD    HPI  DMII with neuropathy and proteinuria: she has neurogenic claudication and history of proteinuria. She has been compliant with medication, and over the past 3 months she has also been compliant with her diet and hgbA1C has gone down from 7.5% to 6.9% Denies polyphagia, polydipsia but she has noticed polyuria with Jardiance. She is on gabapentin and states leg pain has improved, also takes ACE for microalbuminuria.   Hyperlipidemia: taking simvastatin. Denies side effects of medications. Explained the importance of continue on medication to decrease risk of heart attacks or strokes.   COPD: she still smokes daily but she is down to two cigarettes daily, had a COPD flare in the Fall 2016 , she has been off  Breo and Combivent prn and is doing well at this time. She was seen by Dr. Manuella Ghazi one month ago and was given Z-pack for worsening of cough, she states over the past week she has noticed worsening of cough again, dry at this time and has some wheezing, he denies SOB at this time. We will try switching to [place] from [place], use combivent prn only   Back pain: she has back pain a few times a week, pain now 3/10, occasionally goes higher and takes hydrocodone prn.  Worse with increase in activity, taking hydrocodone ( prescription lasts 3-4 months ) prn and flexeril at night sometimes to control symptoms. She denies leg weakness, no bowel or bladder incontinence. Discussed risk of addiction and importance of no diversion.  We will decrease from 60pills every 3 months to 30 pills and try to stop it. Advised to take Tylenol daily   RLS: She has been taking Requip at night and is controlling symptoms now.    Atopic Dermatitis: doing much better on methotrexate continues to see [facility] dermatologist. She had an episode about one month ago of her entire skin pilling, went to her Dermatologist and symptoms resolved by itself  Paresthesia : she states that over the past few months she has noticed intermittent  tingling down to left shoulder, left anterior chest and down her left arm. Episodes lasts about 15 minutes- during rest or activity. No association with weakness. She denies chest tightness, palpitation, diaphoresis or SOB during episodes. She has a follow up with Dr. Melrose Nakayama the end of this month. Continue Gabapentin for now  Patient Active Problem List   Diagnosis Date Noted  . COPD with acute exacerbation ([place]) 02/09/2016  . Obesity (BMI 30.0-34.9) 12/13/2015  . Chronic bronchitis ([place]) 05/14/2015  . DM type 2 with diabetic peripheral neuropathy ([place]) 05/14/2015  . Positive H. pylori test 04/05/2015  . Umbilical hernia 37/85/8850  . RLS (restless legs syndrome) 01/08/2015  . Type 2 diabetes mellitus with other diabetic kidney complication 27/74/1287  . Neurogenic claudication 01/08/2015  . AD (atopic dermatitis) 12/28/2014  . Chronic LBP 12/28/2014  . Dyslipidemia 12/28/2014  . Gastroesophageal reflux disease without  esophagitis 12/28/2014  . Microalbuminuria 12/28/2014  . Disorder of bone and cartilage 12/28/2014  . Obesity (BMI 30-39.9) 12/28/2014  . Neuralgia of left thigh 12/28/2014  . Perennial allergic rhinitis with seasonal variation 12/28/2014  . Tobacco abuse 12/28/2014  . Hypothyroidism 09/22/2005  . Hypertension, benign 09/22/2005    Past Surgical History:  Procedure Laterality Date  . CATARACT EXTRACTION Bilateral 1980  . CESAREAN SECTION    . CORNEAL TRANSPLANT Left     [facility]-Treatment for blindness.    Family History  Problem Relation Age of Onset  . Diabetes Mother   . Hypertension Mother   . Alcohol abuse Father   . Breast cancer Neg Hx     Social History   Social History  . Marital status: Single    Spouse name: N/A  . Number of children: N/A  . Years of education: N/A   Occupational History  . Not on file.   Social History Main Topics  . Smoking status: Current Every Day Smoker    Packs/day: 0.50    Years: 30.00    Types: Cigarettes    Start date: 01/07/1985  . Smokeless tobacco: Never Used     Comment: trying to stop  . Alcohol use No  . Drug use: No  . Sexual activity: No   Other Topics Concern  . Not on file   Social History Narrative  . No narrative on file     Current Outpatient Prescriptions:  .  aspirin 81 MG chewable tablet, Chew 1 tablet by mouth daily., Disp: , Rfl:  .  cyclobenzaprine (FLEXERIL) 5 MG tablet, Take 1 tablet (5 mg total) by mouth at bedtime., Disp: 30 tablet, Rfl: 2 .  empagliflozin (JARDIANCE) 25 MG TABS tablet, Take 25 mg by mouth daily., Disp: 30 tablet, Rfl: 3 .  fluticasone furoate-vilanterol (BREO ELLIPTA) 100-25 MCG/INH AEPB, Inhale 1 puff into the lungs daily., Disp: 28 each, Rfl: 2 .  folic acid (FOLVITE) 1 MG tablet, Take 1 mg by mouth., Disp: , Rfl:  .  gabapentin (NEURONTIN) 300 MG capsule, Take 1 capsule (300 mg total) by mouth 3 (three) times daily., Disp: 90 capsule, Rfl: 5 .  HYDROcodone-acetaminophen (NORCO/VICODIN) 5-325 MG tablet, Take 2 tablets by mouth 2 (two) times daily., Disp: 60 tablet, Rfl: 0 .  Ipratropium-Albuterol (COMBIVENT RESPIMAT) 20-100 MCG/ACT AERS respimat, Inhale 1 puff into the lungs 4 (four) times daily., Disp: 1 Inhaler, Rfl: 2 .  lisinopril (PRINIVIL,ZESTRIL) 5 MG tablet, Take 1 tablet (5 mg total) by mouth daily., Disp: 30 tablet, Rfl: 5 .  methotrexate (RHEUMATREX) 2.5 MG tablet, Take 15 mg by mouth., Disp: , Rfl:  .  rOPINIRole (REQUIP) 1 MG tablet, Take 1  tablet (1 mg total) by mouth daily., Disp: 30 tablet, Rfl: 5 .  simvastatin (ZOCOR) 10 MG tablet, Take 1 tablet (10 mg total) by mouth daily., Disp: 30 tablet, Rfl: 5 .  sitaGLIPtin-metformin (JANUMET) 50-1000 MG tablet, Take 1 tablet by mouth 2 (two) times daily., Disp: 60 tablet, Rfl: 5 .  triamcinolone cream (KENALOG) 0.1 %, TRIAMCINOLONE ACETONIDE, 0.1% (External Cream)  use per dermatologist. for 0 days  Quantity: 1.00;  Refills: 0   Ordered :15-Jun-2010  Steele Sizer MD;  Started 02-Apr-2008 Active Comments: DX: 724.2, Disp: , Rfl:   Allergies  Allergen Reactions  . Penicillins      ROS  Constitutional: Negative for fever or weight change.  Respiratory: positive for cough , no shortness of breath.   Cardiovascular: Negative for chest  pain or palpitations.  Gastrointestinal: Negative for abdominal pain, no bowel changes.  Musculoskeletal: Negative for gait problem or joint swelling.  Skin: Positive for chronic  rash.   Neurological: Negative for dizziness or headache.  No other specific complaints in a complete review of systems (except as listed in HPI above).  Objective  Vitals:   03/14/16 0913  BP: 104/60  Pulse: (!) 102  Resp: 16  Temp: 98.8 F (37.1 C)  SpO2: 95%  Weight: 178 lb (80.7 kg)  Height: _0  (1.499 m)    Body mass index is 35.95 kg/m.  Physical Exam  Constitutional: Patient appears well-developed and well-nourished. Obese  No distress.  HEENT: head atraumatic, normocephalic, pupils equal and reactive to light,  neck supple, throat within normal limits Cardiovascular: Normal rate, regular rhythm and normal heart sounds.  No murmur heard. No BLE edema. Pulmonary/Chest: Effort normal and breath sounds normal. No respiratory distress. Abdominal: Soft.  There is no tenderness. Psychiatric: Patient has a normal mood and affect. behavior is normal. Judgment and thought content normal. Neurological: normal neck exam, normal grip, normal sensation  during exam Skin: dry and has hypopigmentation from eczema Muscular skeletal: pain during palpation of right lower back, negative straight leg raise   Recent Results (from the past 2160 hour(s))  CBC with Differential     Status: None   Collection Time: 01/09/16  2:26 PM  Result Value Ref Range   WBC 8.1 3.6 - 11.0 K/uL   RBC 4.87 3.80 - 5.20 MIL/uL   Hemoglobin 16.0 12.0 - 16.0 g/dL   HCT 45.1 35.0 - 47.0 %   MCV 92.8 80.0 - 100.0 fL   MCH 32.8 26.0 - 34.0 pg   MCHC 35.3 32.0 - 36.0 g/dL   RDW 14.4 11.5 - 14.5 %   Platelets 264 150 - 440 K/uL   Neutrophils Relative % 62 %   Neutro Abs 5.0 1.4 - 6.5 K/uL   Lymphocytes Relative 29 %   Lymphs Abs 2.4 1.0 - 3.6 K/uL   Monocytes Relative 5 %   Monocytes Absolute 0.4 0.2 - 0.9 K/uL   Eosinophils Relative 3 %   Eosinophils Absolute 0.2 0 - 0.7 K/uL   Basophils Relative 1 %   Basophils Absolute 0.1 0 - 0.1 K/uL  Comprehensive metabolic panel     Status: Abnormal   Collection Time: 01/09/16  2:26 PM  Result Value Ref Range   Sodium 139 135 - 145 mmol/L   Potassium 3.9 3.5 - 5.1 mmol/L   Chloride 107 101 - 111 mmol/L   CO2 26 22 - 32 mmol/L   Glucose, Bld 117 (H) 65 - 99 mg/dL   BUN 8 6 - 20 mg/dL   Creatinine, Ser 0.71 0.44 - 1.00 mg/dL   Calcium 9.3 8.9 - 10.3 mg/dL   Total Protein 8.0 6.5 - 8.1 g/dL   Albumin 3.9 3.5 - 5.0 g/dL   AST 15 15 - 41 U/L   ALT 13 (L) 14 - 54 U/L   Alkaline Phosphatase 77 38 - 126 U/L   Total Bilirubin 0.6 0.3 - 1.2 mg/dL   GFR calc non Af Amer >60 >60 mL/min   GFR calc Af Amer >60 >60 mL/min    Comment: (NOTE) The eGFR has been calculated using the CKD EPI equation. This calculation has not been validated in all clinical situations. eGFR's persistently <60 mL/min signify possible Chronic Kidney Disease.    Anion gap 6 5 - 15  Troponin I  Status: None   Collection Time: 01/09/16  2:26 PM  Result Value Ref Range   Troponin I <0.03 <0.03 ng/mL  POCT HgB A1C     Status: None   Collection  Time: 03/14/16  9:28 AM  Result Value Ref Range   Hemoglobin A1C 6.9      PHQ2/9: Depression screen [facility] 2/9 03/14/2016 12/13/2015 08/12/2015 05/06/2015 04/01/2015  Decreased Interest 0 0 0 0 0  Down, Depressed, Hopeless 0 0 0 0 0  PHQ - 2 Score 0 0 0 0 0     Fall Risk: Fall Risk  03/14/2016 12/13/2015 08/12/2015 05/06/2015 04/01/2015  Falls in the past year? Yes No No No No  Number falls in past yr: 1 - - - -  Injury with Fall? No - - - -  Follow up Falls evaluation completed - - - -      Functional Status Survey: Is the patient deaf or have difficulty hearing?: No Does the patient have difficulty seeing, even when wearing glasses/contacts?: No Does the patient have difficulty concentrating, remembering, or making decisions?: No Does the patient have difficulty walking or climbing stairs?: No Does the patient have difficulty dressing or bathing?: No Does the patient have difficulty doing errands alone such as visiting a doctor's office or shopping?: No    Assessment & Plan  1. DM type 2 with diabetic peripheral neuropathy (HCC)  - POCT HgB A1C - empagliflozin (JARDIANCE) 25 MG TABS tablet; Take 25 mg by mouth daily.  Dispense: 90 tablet; Refill: 1 - sitaGLIPtin-metformin (JANUMET) 50-1000 MG tablet; Take 1 tablet by mouth 2 (two) times daily.  Dispense: 180 tablet; Refill: 1  2. Need for influenza vaccination  - Flu Vaccine QUAD 36+ mos PF IM (Fluarix & Fluzone Quad PF)  3. Type 2 diabetes mellitus with other diabetic kidney complication, without [place]ni-term current use of insulin (HCC)  - empagliflozin (JARDIANCE) 25 MG TABS tablet; Take 25 mg by mouth daily.  Dispense: 90 tablet; Refill: 1 - lisinopril (PRINIVIL,ZESTRIL) 5 MG tablet; Take 1 tablet (5 mg total) by mouth daily.  Dispense: 90 tablet; Refill: 1 - sitaGLIPtin-metformin (JANUMET) 50-1000 MG tablet; Take 1 tablet by mouth 2 (two) times daily.  Dispense: 180 tablet; Refill: 1  4. Microalbuminuria  - lisinopril  (PRINIVIL,ZESTRIL) 5 MG tablet; Take 1 tablet (5 mg total) by mouth daily.  Dispense: 90 tablet; Refill: 1  5. Dyslipidemia  - Lipid panel - atorvastatin (LIPITOR) 40 MG tablet; Take 1 tablet (40 mg total) by mouth daily. In place of simvastatin  Dispense: 90 tablet; Refill: 1  6. Neurogenic claudication  - HYDROcodone-acetaminophen (NORCO/VICODIN) 5-325 MG tablet; Take 2 tablets by mouth 2 (two) times daily as needed for moderate pain.  Dispense: 30 tablet; Refill: 0 - gabapentin (NEURONTIN) 300 MG capsule; Take 1 capsule (300 mg total) by mouth 3 (three) times daily.  Dispense: 270 capsule; Refill: 1  7. Chronic bilateral low back pain without sciatica  - cyclobenzaprine (FLEXERIL) 5 MG tablet; Take 1 tablet (5 mg total) by mouth at bedtime.  Dispense: 30 tablet; Refill: 2 - HYDROcodone-acetaminophen (NORCO/VICODIN) 5-325 MG tablet; Take 2 tablets by mouth 2 (two) times daily as needed for moderate pain.  Dispense: 30 tablet; Refill: 0 - gabapentin (NEURONTIN) 300 MG capsule; Take 1 capsule (300 mg total) by mouth 3 (three) times daily.  Dispense: 270 capsule; Refill: 1  8. COPD with acute exacerbation (HCC)  Monitor glucose closely and be very strict with diet while taking Prednisone -  umeclidinium-vilanterol (ANORO ELLIPTA) 62.5-25 MCG/INH AEPB; Inhale 1 puff into the lungs daily.  Dispense: 180 each; Refill: 0 - predniSONE (DELTASONE) 10 MG tablet; Take 1 tablet (10 mg total) by mouth 2 (two) times daily.  Dispense: 6 tablet; Refill: 0 - Dextromethorphan-Guaifenesin 10-100 MG/5ML liquid; Take 5 mLs by mouth every 12 (twelve) hours.  Dispense: 236 mL; Refill: 0  9. RLS (restless legs syndrome)  - rOPINIRole (REQUIP) 1 MG tablet; Take 1 tablet (1 mg total) by mouth daily.  Dispense: 90 tablet; Refill: 1   10. Abnormal thyroid function test  Found on [facility] records, she states she took levothyroxine for a period of time, but not currently on it, we will recheck labs - TSH - T3  Uptake

## 2016-03-14 NOTE — Addendum Note (Signed)
Addended by: Lelon FrohlichGOLDEN, TASHIA D on: 03/14/2016 10:06 AM   Modules accepted: Orders

## 2016-03-15 LAB — LIPID PANEL
CHOL/HDL RATIO: 2.7 ratio (ref <=5.0)
Cholesterol: 117 mg/dL — ABNORMAL LOW (ref 125–200)
HDL: 43 mg/dL — ABNORMAL LOW (ref >=46)
LDL CALC: 57 mg/dL (ref <130)
Triglycerides: 86 mg/dL (ref <150)
VLDL: 17 mg/dL (ref <30)

## 2016-03-15 LAB — TSH: TSH: 0.66 m[IU]/L

## 2016-03-15 LAB — T3 UPTAKE: T3 UPTAKE: 32 % (ref 22–35)

## 2016-06-14 ENCOUNTER — Ambulatory Visit (INDEPENDENT_AMBULATORY_CARE_PROVIDER_SITE_OTHER): Payer: Medicare Other | Admitting: Family Medicine

## 2016-06-14 ENCOUNTER — Encounter: Payer: Self-pay | Admitting: Family Medicine

## 2016-06-14 VITALS — BP 108/66 | HR 116 | Temp 98.2°F | Resp 20 | Ht 59.0 in | Wt 176.6 lb

## 2016-06-14 DIAGNOSIS — E785 Hyperlipidemia, unspecified: Secondary | ICD-10-CM | POA: Diagnosis not present

## 2016-06-14 DIAGNOSIS — M48062 Spinal stenosis, lumbar region with neurogenic claudication: Secondary | ICD-10-CM

## 2016-06-14 DIAGNOSIS — L2089 Other atopic dermatitis: Secondary | ICD-10-CM

## 2016-06-14 DIAGNOSIS — E1142 Type 2 diabetes mellitus with diabetic polyneuropathy: Secondary | ICD-10-CM | POA: Diagnosis not present

## 2016-06-14 DIAGNOSIS — E1129 Type 2 diabetes mellitus with other diabetic kidney complication: Secondary | ICD-10-CM

## 2016-06-14 DIAGNOSIS — R202 Paresthesia of skin: Secondary | ICD-10-CM

## 2016-06-14 DIAGNOSIS — M545 Low back pain, unspecified: Secondary | ICD-10-CM

## 2016-06-14 DIAGNOSIS — G9519 Other vascular myelopathies: Secondary | ICD-10-CM

## 2016-06-14 DIAGNOSIS — J411 Mucopurulent chronic bronchitis: Secondary | ICD-10-CM

## 2016-06-14 DIAGNOSIS — G8929 Other chronic pain: Secondary | ICD-10-CM | POA: Diagnosis not present

## 2016-06-14 DIAGNOSIS — R220 Localized swelling, mass and lump, head: Secondary | ICD-10-CM

## 2016-06-14 LAB — POCT GLYCOSYLATED HEMOGLOBIN (HGB A1C): Hemoglobin A1C: 7.2

## 2016-06-14 MED ORDER — HYDROCODONE-ACETAMINOPHEN 5-325 MG PO TABS: 2.0000 | ORAL_TABLET | Freq: Two times a day (BID) | ORAL | 0 refills | Status: DC | PRN

## 2016-06-14 MED ORDER — CYCLOBENZAPRINE HCL 5 MG PO TABS: 5.0000 mg | ORAL_TABLET | Freq: Every day | ORAL | 2 refills | Status: DC

## 2016-06-14 NOTE — Progress Notes (Signed)
Name: Julie Wall   MRN: [id number]    DOB: 12/14/1955   Date:06/14/2016       Progress Note  Subjective  Chief Complaint  Chief Complaint  Patient presents with  . Medication Refill    3 month F/U  . Diabetes    Checks sugar once a day, Averages-120's  . Hyperlipidemia  . Hypertension    Denies any symptoms  . COPD    Doing well with the Anoro and combivent prn    HPI  DMII with neuropathy and proteinuria: she has neurogenic claudication and history of proteinuria. She has been compliant with medication, but not with her diet over the past few months - secondary to the holidays  and hgbA1C had gone down from 7.5% to 6.9% , but is up again at 7.3% Denies polyphagia, polydipsia but she has noticed polyuria with Jardiance. She is on gabapentin and states leg pain is stable, also takes ACE for microalbuminuria.   Hyperlipidemia: taking Atorvastatin. Denies side effects of medications. Explained the importance of continue on medication to decrease risk of heart attacks or strokes. Last LDL was at goal   COPD: she still smokes daily but she is down to two cigarettes daily, had a COPD flare in the Fall 2016 and 2017 , she is now on Anoro and only using Combivent prn. She is feeling better now. She still has a daily cough, wet sounding, and at times sputum comes up. She is due for spirometry. SOB with activity ( negative Stress test done in Dec 2017 by Dr. Juliann Paresallwood )  Back pain: she has back pain a few times a week, pain now 5/10, occasionally goes higher and takes hydrocodone prn.  Worse with increase in activity, taking hydrocodone ( prescription lasts 3-4 months ) prn and flexeril at night sometimes to control symptoms. She denies leg weakness, no bowel or bladder incontinence. Discussed risk of addiction and importance of no diversion. We decreased Hydrocodone to 30 pills to last 3 months, Fall 2017 and she is doing well, with prn medication. Advised to take Tylenol daily   RLS: She  has been taking Requip at night and is controlling symptoms now.    Atopic Dermatitis: doing much better on methotrexate [facility]. She had an episode about one month ago of her entire skin pilling, went to her Dermatologist and symptoms resolved by itself  Paresthesia : she states that over the past 6 months she has noticed intermittent tingling down to left shoulder, left anterior chest and down her left arm. Episodes lasts about 15 minutes- during rest or activity. No association with weakness. She denies chest tightness, palpitation, diaphoresis or SOB during episodes. She was evaluated by Dr. Malvin JohnsPotter ( neurologist ) and Dr. Juliann ParesAllwood ( cardiologist ) and is now wearing a wrist brace, still taking Gabapentin, and symptoms have improved, but she states the brace makes her unable to do things she likes. Advised her to try to keep it on as recommended  Patient Active Problem List   Diagnosis Date Noted  . COPD with acute exacerbation (HCC) 02/09/2016  . Obesity (BMI 30.0-34.9) 12/13/2015  . Chronic bronchitis (HCC) 05/14/2015  . DM type 2 with diabetic peripheral neuropathy (HCC) 05/14/2015  . Positive H. pylori test 04/05/2015  . Rapid urease test for Helicobacter pylori infection positive 04/05/2015  . Umbilical hernia 04/01/2015  . Exomphalos 04/01/2015  . RLS (restless legs syndrome) 01/08/2015  . Type 2 diabetes mellitus with other diabetic kidney complication 01/08/2015  .  Neurogenic claudication 01/08/2015  . AD (atopic dermatitis) 12/28/2014  . Chronic LBP 12/28/2014  . Dyslipidemia 12/28/2014  . Gastroesophageal reflux disease without esophagitis 12/28/2014  . Microalbuminuria 12/28/2014  . Disorder of bone and cartilage 12/28/2014  . Obesity (BMI 30-39.9) 12/28/2014  . Neuralgia of left thigh 12/28/2014  . Perennial allergic rhinitis with seasonal variation 12/28/2014  . Tobacco abuse 12/28/2014  . Hypothyroidism 09/22/2005  . Hypertension, benign  09/22/2005    Past Surgical History:  Procedure Laterality Date  . CATARACT EXTRACTION Bilateral 1980  . CESAREAN SECTION    . CORNEAL TRANSPLANT Left    [facility]-Treatment for blindness.    Family History  Problem Relation Age of Onset  . Diabetes Mother   . Hypertension Mother   . Alcohol abuse Father   . Breast cancer Neg Hx     Social History   Social History  . Marital status: Single    Spouse name: N/A  . Number of children: N/A  . Years of education: N/A   Occupational History  . Not on file.   Social History Main Topics  . Smoking status: Current Every Day Smoker    Packs/day: 0.25    Years: 32.00    Start date: 01/07/1985  . Smokeless tobacco: Never Used     Comment: trying to stop  . Alcohol use No  . Drug use: No  . Sexual activity: No   Other Topics Concern  . Not on file   Social History Narrative  . No narrative on file     Current Outpatient Prescriptions:  .  Lancet Devices (SIMPLE DIAGNOSTICS LANCING DEV) MISC, , Disp: , Rfl:  .  aspirin 81 MG chewable tablet, Chew 1 tablet by mouth daily., Disp: , Rfl:  .  atorvastatin (LIPITOR) 40 MG tablet, Take 1 tablet (40 mg total) by mouth daily. In place of simvastatin, Disp: 90 tablet, Rfl: 1 .  cyclobenzaprine (FLEXERIL) 5 MG tablet, Take 1 tablet (5 mg total) by mouth at bedtime., Disp: 30 tablet, Rfl: 2 .  empagliflozin (JARDIANCE) 25 MG TABS tablet, Take 25 mg by mouth daily., Disp: 90 tablet, Rfl: 1 .  folic acid (FOLVITE) 1 MG tablet, Take by mouth., Disp: , Rfl:  .  gabapentin (NEURONTIN) 300 MG capsule, Take 1 capsule (300 mg total) by mouth 3 (three) times daily., Disp: 270 capsule, Rfl: 1 .  HYDROcodone-acetaminophen (NORCO/VICODIN) 5-325 MG tablet, Take 2 tablets by mouth 2 (two) times daily as needed for moderate pain., Disp: 30 tablet, Rfl: 0 .  Ipratropium-Albuterol (COMBIVENT RESPIMAT) 20-100 MCG/ACT AERS respimat, Inhale 1 puff into the lungs 4 (four) times daily., Disp: 1 Inhaler, Rfl:  2 .  lisinopril (PRINIVIL,ZESTRIL) 5 MG tablet, Take 1 tablet (5 mg total) by mouth daily., Disp: 90 tablet, Rfl: 1 .  methotrexate (RHEUMATREX) 2.5 MG tablet, Take 15 mg by mouth., Disp: , Rfl:  .  rOPINIRole (REQUIP) 1 MG tablet, Take 1 tablet (1 mg total) by mouth daily., Disp: 90 tablet, Rfl: 1 .  sitaGLIPtin-metformin (JANUMET) 50-1000 MG tablet, Take 1 tablet by mouth 2 (two) times daily., Disp: 180 tablet, Rfl: 1 .  triamcinolone cream (KENALOG) 0.1 %, TRIAMCINOLONE ACETONIDE, 0.1% (External Cream)  use per dermatologist. for 0 days  Quantity: 1.00;  Refills: 0   Ordered :15-Jun-2010  Alba Cory MD;  Started 02-Apr-2008 Active Comments: DX: 724.2, Disp: , Rfl:  .  umeclidinium-vilanterol (ANORO ELLIPTA) 62.5-25 MCG/INH AEPB, Inhale 1 puff into the lungs daily., Disp: 180 each, Rfl:  0  Allergies  Allergen Reactions  . Penicillins Rash     ROS  Constitutional: Negative for fever or weight change.  Respiratory: Positive for cough and shortness of breath with activity .   Cardiovascular: Negative for chest pain or palpitations.  Gastrointestinal: Negative for abdominal pain, no bowel changes.  Musculoskeletal: Negative for gait problem or joint swelling.  Skin: Positive for rash.  Neurological: Negative for dizziness or headache.  No other specific complaints in a complete review of systems (except as listed in HPI above).   Objective  Vitals:   06/14/16 0915  BP: 108/66  Pulse: (!) 116  Resp: 20  Temp: 98.2 F (36.8 C)  TempSrc: Oral  SpO2: 93%  Weight: 176 lb 9.6 oz (80.1 kg)  Height: 4\' 11"  (1.499 m)    Body mass index is 35.67 kg/m.  Physical Exam  Constitutional: Patient appears well-developed and well-nourished. Obese  No distress.  HEENT: head atraumatic, normocephalic, pupils equal and reactive to light,  neck supple, throat within normal limits Cardiovascular: Normal rate, regular rhythm and normal heart sounds.  No murmur heard. No BLE  edema. Pulmonary/Chest: Effort normal and breath sounds normal. No respiratory distress. Abdominal: Soft.  There is no tenderness. Skin: hypopigmentation from previous severe atopic dermatitis, she also has a nuchal mass, she refuses to have [place] for further evaluation, she states it is from scratching because of eczema Psychiatric: Patient has a normal mood and affect. behavior is normal. Judgment and thought content normal.  Recent Results (from the past 2160 hour(s))  POCT HgB A1C     Status: Abnormal   Collection Time: 06/14/16  9:24 AM  Result Value Ref Range   Hemoglobin A1C 7.2       PHQ2/9: Depression screen [facility] 2/9 06/14/2016 03/14/2016 12/13/2015 08/12/2015 05/06/2015  Decreased Interest 0 0 0 0 0  Down, Depressed, Hopeless 0 0 0 0 0  PHQ - 2 Score 0 0 0 0 0     Fall Risk: Fall Risk  06/14/2016 03/14/2016 12/13/2015 08/12/2015 05/06/2015  Falls in the past year? No Yes No No No  Number falls in past yr: - 1 - - -  Injury with Fall? - No - - -  Follow up - Falls evaluation completed - - -     Functional Status Survey: Is the patient deaf or have difficulty hearing?: No Does the patient have difficulty seeing, even when wearing glasses/contacts?: No Does the patient have difficulty concentrating, remembering, or making decisions?: No Does the patient have difficulty walking or climbing stairs?: No Does the patient have difficulty dressing or bathing?: No Does the patient have difficulty doing errands alone such as visiting a doctor's office or shopping?: No    Assessment & Plan  1. DM type 2 with diabetic peripheral neuropathy (HCC)  hgbA1C is up at 7.2%, she has not been compliant with her diet over the holidays, but she will resume her diet, she wants to continue medications - POCT HgB A1C  2. Type 2 diabetes mellitus with other diabetic kidney complication, without [place]-term current use of insulin (HCC)  With microalbuminuria, continue low dose ACE  3.  Dyslipidemia  Taking Atorvastatin  4. Paresthesia of left arm  Seen by Dr. Juliann Pares and Dr. Malvin Johns, had EMG/NCS, stress test, echocardiogram and doppler US ( can't see that return ), she is wearing a wrist brace and seems to be helping symptoms  5. Other atopic dermatitis  Goes to [facility]   6. Mucopurulent chronic  bronchitis (HCC)  -Spirometry  Taking Anoro, and combivent only prn   7. Chronic bilateral low back pain without sciatica  - cyclobenzaprine (FLEXERIL) 5 MG tablet; Take 1 tablet (5 mg total) by mouth at bedtime.  Dispense: 30 tablet; Refill: 2 - HYDROcodone-acetaminophen (NORCO/VICODIN) 5-325 MG tablet; Take 2 tablets by mouth 2 (two) times daily as needed for moderate pain.  Dispense: 30 tablet; Refill: 0  9. Mass of scalp  She refused to have [place]  9. Neurogenic claudication  - HYDROcodone-acetaminophen (NORCO/VICODIN) 5-325 MG tablet; Take 2 tablets by mouth 2 (two) times daily as needed for moderate pain.  Dispense: 30 tablet; Refill: 0

## 2016-09-13 ENCOUNTER — Encounter: Payer: Self-pay | Admitting: Family Medicine

## 2016-09-13 ENCOUNTER — Ambulatory Visit (INDEPENDENT_AMBULATORY_CARE_PROVIDER_SITE_OTHER): Payer: Medicare Other | Admitting: Family Medicine

## 2016-09-13 VITALS — BP 108/56 | HR 108 | Temp 98.2°F | Resp 16 | Ht 59.0 in | Wt 183.2 lb

## 2016-09-13 DIAGNOSIS — G2581 Restless legs syndrome: Secondary | ICD-10-CM

## 2016-09-13 DIAGNOSIS — L2089 Other atopic dermatitis: Secondary | ICD-10-CM

## 2016-09-13 DIAGNOSIS — M48062 Spinal stenosis, lumbar region with neurogenic claudication: Secondary | ICD-10-CM | POA: Diagnosis not present

## 2016-09-13 DIAGNOSIS — M545 Low back pain: Secondary | ICD-10-CM

## 2016-09-13 DIAGNOSIS — J411 Mucopurulent chronic bronchitis: Secondary | ICD-10-CM

## 2016-09-13 DIAGNOSIS — G8929 Other chronic pain: Secondary | ICD-10-CM | POA: Diagnosis not present

## 2016-09-13 DIAGNOSIS — E1142 Type 2 diabetes mellitus with diabetic polyneuropathy: Secondary | ICD-10-CM | POA: Diagnosis not present

## 2016-09-13 DIAGNOSIS — G9519 Other vascular myelopathies: Secondary | ICD-10-CM

## 2016-09-13 DIAGNOSIS — R296 Repeated falls: Secondary | ICD-10-CM | POA: Diagnosis not present

## 2016-09-13 DIAGNOSIS — E785 Hyperlipidemia, unspecified: Secondary | ICD-10-CM

## 2016-09-13 DIAGNOSIS — J441 Chronic obstructive pulmonary disease with (acute) exacerbation: Secondary | ICD-10-CM

## 2016-09-13 DIAGNOSIS — R809 Proteinuria, unspecified: Secondary | ICD-10-CM | POA: Diagnosis not present

## 2016-09-13 DIAGNOSIS — E1129 Type 2 diabetes mellitus with other diabetic kidney complication: Secondary | ICD-10-CM

## 2016-09-13 DIAGNOSIS — R29818 Other symptoms and signs involving the nervous system: Secondary | ICD-10-CM

## 2016-09-13 LAB — POCT GLYCOSYLATED HEMOGLOBIN (HGB A1C): HEMOGLOBIN A1C: 7.4

## 2016-09-13 MED ORDER — CYCLOBENZAPRINE HCL 5 MG PO TABS: 5.0000 mg | ORAL_TABLET | Freq: Every day | ORAL | 2 refills | Status: DC

## 2016-09-13 MED ORDER — LISINOPRIL 5 MG PO TABS: 5.0000 mg | ORAL_TABLET | Freq: Every day | ORAL | 1 refills | Status: DC

## 2016-09-13 MED ORDER — ATORVASTATIN CALCIUM 40 MG PO TABS: 40.0000 mg | ORAL_TABLET | Freq: Every day | ORAL | 1 refills | Status: DC

## 2016-09-13 MED ORDER — GABAPENTIN 300 MG PO CAPS: 300.0000 mg | ORAL_CAPSULE | Freq: Three times a day (TID) | ORAL | 1 refills | Status: DC

## 2016-09-13 MED ORDER — ROPINIROLE HCL 1 MG PO TABS: 1.0000 mg | ORAL_TABLET | Freq: Every day | ORAL | 1 refills | Status: DC

## 2016-09-13 MED ORDER — UMECLIDINIUM-VILANTEROL 62.5-25 MCG/INH IN AEPB: 1.0000 | INHALATION_SPRAY | Freq: Every day | RESPIRATORY_TRACT | 1 refills | Status: DC

## 2016-09-13 MED ORDER — HYDROCODONE-ACETAMINOPHEN 5-325 MG PO TABS: 2.0000 | ORAL_TABLET | Freq: Two times a day (BID) | ORAL | 0 refills | Status: DC | PRN

## 2016-09-13 MED ORDER — EMPAGLIFLOZIN 25 MG PO TABS: 25.0000 mg | ORAL_TABLET | Freq: Every day | ORAL | 1 refills | Status: DC

## 2016-09-13 MED ORDER — SITAGLIPTIN PHOS-METFORMIN HCL 50-1000 MG PO TABS: 1.0000 | ORAL_TABLET | Freq: Two times a day (BID) | ORAL | 1 refills | Status: DC

## 2016-09-13 NOTE — Progress Notes (Signed)
Name: Julie Wall   MRN: [id number]    DOB: April 18, 1956   Date:09/13/2016       Progress Note  Subjective  Chief Complaint  Chief Complaint  Patient presents with  . Medication Refill    3 month F/U  . Diabetes  . Hypertension  . Hyperlipidemia  . COPD    HPI  DMII with neuropathy and proteinuria: she has neurogenic claudication and history of proteinuria. She has been compliant with medication, but not with her diet over the past few months - secondary to eating more pasta and going to birthday parties plus [employer]. Her hgbA1C had gone down from 7.5% to 6.9% ,up to 7. 2 and today at 7.4% Denies polyphagia, polydipsia but she has noticed polyuria with Jardiance. She is on gabapentin and states leg pain is stable, also takes ACE for microalbuminuria.   Hyperlipidemia: taking Atorvastatin. Denies side effects of medications. Explained the importance of continue on medication to decrease risk of heart attacks or strokes. Last LDL was at goal   COPD: she still smokes daily but she is down to two cigarettes daily, had a COPD flare in the Fall 2016 and 2017 , she is now on Anoro and only using Combivent prn.  She still has a daily cough, wet sounding, and at times sputum comes up usually in am's. SOB with activity ( negative Stress test done in Dec 2017 by Dr. Juliann Pares )  Back pain: she has back pain a few times a week, pain now 5/10, occasionally goes higher and takes hydrocodone prn. Worse with increase in activity, taking hydrocodone ( prescription lasts 3-4 months ) prn and flexeril at night sometimes to control symptoms. She denies leg weakness, no bowel or bladder incontinence. Discussed risk of addiction and importance of no diversion. We decreased Hydrocodone to 30 pills to last 3 months, Fall 2017 and she is doing well, with prn medication. Advised to take Tylenol daily   RLS: She has been taking Requip at night and is controlling symptoms now.   Fall : she has  fallen twice in the past 12 months, she states last time it happened when she got up, legs felt stiff , no dizziness, did not trip. States legs gave out and she fell, did not hit her head and did not passed out.   Atopic Dermatitis: doing much better on methotrexate continues to see [facility] dermatologist. She had an episode about one month ago of her entire skin pilling, went to her Dermatologist and symptoms resolved by itself  Paresthesia : she states that over the past 9 months she has noticed intermittent tingling down to left shoulder, left anterior chest and down her left arm. Episodes lasts about 15 minutes- during rest or activity. No association with weakness. She denies chest tightness, palpitation, diaphoresis or SOB during episodes. She was evaluated by Dr. Malvin Johns ( neurologist ) and Dr. Juliann Pares ( cardiologist ) and is now wearing a wrist brace, still taking Gabapentin, and symptoms have improved, but she states the brace makes her unable to do things she likes. Advised her to try to keep it on as recommended. She episodes are not as often, at most twice a week now   Patient Active Problem List   Diagnosis Date Noted  . COPD with acute exacerbation (HCC) 02/09/2016  . Obesity (BMI 30.0-34.9) 12/13/2015  . Chronic bronchitis (HCC) 05/14/2015  . DM type 2 with diabetic peripheral neuropathy (HCC) 05/14/2015  . Positive H. pylori test 04/05/2015  .  Rapid urease test for Helicobacter pylori infection positive 04/05/2015  . Umbilical hernia 04/01/2015  . Exomphalos 04/01/2015  . RLS (restless legs syndrome) 01/08/2015  . Type 2 diabetes mellitus with other diabetic kidney complication 01/08/2015  . Neurogenic claudication 01/08/2015  . AD (atopic dermatitis) 12/28/2014  . Chronic LBP 12/28/2014  . Dyslipidemia 12/28/2014  . Gastroesophageal reflux disease without esophagitis 12/28/2014  . Microalbuminuria 12/28/2014  . Disorder of bone and cartilage 12/28/2014  . Obesity (BMI  30-39.9) 12/28/2014  . Neuralgia of left thigh 12/28/2014  . Perennial allergic rhinitis with seasonal variation 12/28/2014  . Tobacco abuse 12/28/2014  . Hypothyroidism 09/22/2005  . Hypertension, benign 09/22/2005    Past Surgical History:  Procedure Laterality Date  . CATARACT EXTRACTION Bilateral 1980  . CESAREAN SECTION    . CORNEAL TRANSPLANT Left    [facility]-Treatment for blindness.    Family History  Problem Relation Age of Onset  . Diabetes Mother   . Hypertension Mother   . Alcohol abuse Father   . Breast cancer Neg Hx     Social History   Social History  . Marital status: Single    Spouse name: N/A  . Number of children: N/A  . Years of education: N/A   Occupational History  . Not on file.   Social History Main Topics  . Smoking status: Current Every Day Smoker    Packs/day: 0.25    Years: 32.00    Start date: 01/07/1985  . Smokeless tobacco: Never Used     Comment: trying to stop  . Alcohol use No  . Drug use: No  . Sexual activity: No   Other Topics Concern  . Not on file   Social History Narrative  . No narrative on file     Current Outpatient Prescriptions:  .  aspirin 81 MG chewable tablet, Chew 1 tablet by mouth daily., Disp: , Rfl:  .  atorvastatin (LIPITOR) 40 MG tablet, Take 1 tablet (40 mg total) by mouth daily. In place of simvastatin, Disp: 90 tablet, Rfl: 1 .  cyclobenzaprine (FLEXERIL) 5 MG tablet, Take 1 tablet (5 mg total) by mouth at bedtime., Disp: 30 tablet, Rfl: 2 .  empagliflozin (JARDIANCE) 25 MG TABS tablet, Take 25 mg by mouth daily., Disp: 90 tablet, Rfl: 1 .  folic acid (FOLVITE) 1 MG tablet, Take by mouth., Disp: , Rfl:  .  gabapentin (NEURONTIN) 300 MG capsule, Take 1 capsule (300 mg total) by mouth 3 (three) times daily., Disp: 270 capsule, Rfl: 1 .  HYDROcodone-acetaminophen (NORCO/VICODIN) 5-325 MG tablet, Take 2 tablets by mouth 2 (two) times daily as needed for moderate pain., Disp: 30 tablet, Rfl: 0 .   Ipratropium-Albuterol (COMBIVENT RESPIMAT) 20-100 MCG/ACT AERS respimat, Inhale 1 puff into the lungs 4 (four) times daily., Disp: 1 Inhaler, Rfl: 2 .  Lancet Devices (SIMPLE DIAGNOSTICS LANCING DEV) MISC, , Disp: , Rfl:  .  lisinopril (PRINIVIL,ZESTRIL) 5 MG tablet, Take 1 tablet (5 mg total) by mouth daily., Disp: 90 tablet, Rfl: 1 .  methotrexate (RHEUMATREX) 2.5 MG tablet, Take 15 mg by mouth., Disp: , Rfl:  .  rOPINIRole (REQUIP) 1 MG tablet, Take 1 tablet (1 mg total) by mouth daily., Disp: 90 tablet, Rfl: 1 .  sitaGLIPtin-metformin (JANUMET) 50-1000 MG tablet, Take 1 tablet by mouth 2 (two) times daily., Disp: 180 tablet, Rfl: 1 .  triamcinolone cream (KENALOG) 0.1 %, TRIAMCINOLONE ACETONIDE, 0.1% (External Cream)  use per dermatologist. for 0 days  Quantity: 1.00;  Refills:  0   Ordered :15-Jun-2010  Alba Cory MD;  Started 02-Apr-2008 Active Comments: DX: 724.2, Disp: , Rfl:  .  umeclidinium-vilanterol (ANORO ELLIPTA) 62.5-25 MCG/INH AEPB, Inhale 1 puff into the lungs daily., Disp: 180 each, Rfl: 1  Allergies  Allergen Reactions  . Penicillins Rash     ROS  Constitutional: Negative for fever, positive for weight change.  Respiratory: Positive for morning  cough and occasional  shortness of breath.   Cardiovascular: Negative for chest pain but occasionally has  palpitations.  Gastrointestinal: Negative for abdominal pain, no bowel changes.  Musculoskeletal: Negative  for gait problem but no  joint swelling.  Skin: Positive  for rash.  Neurological: Negative for dizziness or headache.  No other specific complaints in a complete review of systems (except as listed in HPI above).  Objective  Vitals:   09/13/16 0858  BP: (!) 108/56  Pulse: (!) 108  Resp: 16  Temp: 98.2 F (36.8 C)  SpO2: 95%  Weight: 183 lb 4 oz (83.1 kg)  Height:  (1.499 m)    Body mass index is 37.01 kg/m.  Physical Exam  Constitutional: Patient appears well-developed and well-nourished.  Obese  No distress.  HEENT: head atraumatic, normocephalic, pupils equal and reactive to light,  neck supple, throat within normal limits Cardiovascular: Normal rate, regular rhythm and normal heart sounds.  No murmur heard. Trace BLE edema. Pulmonary/Chest: Effort normal and breath sounds normal. No respiratory distress. Abdominal: Soft.  There is no tenderness. Skin: hypopigmentation from previous severe atopic dermatitis, she also has a nuchal mass, she refuses to have [place] for further evaluation, she states it is from scratching because of eczema Psychiatric: Patient has a normal mood and affect. behavior is normal. Judgment and thought content normal.  Recent Results (from the past 2160 hour(s))  POCT HgB A1C     Status: Abnormal   Collection Time: 09/13/16  9:03 AM  Result Value Ref Range   Hemoglobin A1C 7.4     Diabetic Foot Exam: Diabetic Foot Exam - Simple   Simple Foot Form Visual Inspection See comments:  Yes Sensation Testing Intact to touch and monofilament testing bilaterally:  Yes Pulse Check Posterior Tibialis and Dorsalis pulse intact bilaterally:  Yes Comments Corn formation on both greater toe      PHQ2/9: Depression screen [facility] 2/9 09/13/2016 06/14/2016 03/14/2016 12/13/2015 08/12/2015  Decreased Interest 0 0 0 0 0  Down, Depressed, Hopeless 0 0 0 0 0  PHQ - 2 Score 0 0 0 0 0     Fall Risk: Fall Risk  09/13/2016 06/14/2016 03/14/2016 12/13/2015 08/12/2015  Falls in the past year? Yes No Yes No No  Number falls in past yr: 2 or more - 1 - -  Injury with Fall? No - No - -  Risk Factor Category  High Fall Risk - - - -  Follow up Falls evaluation completed - Falls evaluation completed - -      Assessment & Plan  1. DM type 2 with diabetic peripheral neuropathy (HCC)  She states she will resume a diabetic diet - POCT HgB A1C - empagliflozin (JARDIANCE) 25 MG TABS tablet; Take 25 mg by mouth daily.  Dispense: 90 tablet; Refill: 1 - sitaGLIPtin-metformin  (JANUMET) 50-1000 MG tablet; Take 1 tablet by mouth 2 (two) times daily.  Dispense: 180 tablet; Refill: 1  2. Dyslipidemia  Continue Atorvastatin  3. Other atopic dermatitis  Continue follow up with Dermatologist   4. Chronic bilateral low back pain  without sciatica  - gabapentin (NEURONTIN) 300 MG capsule; Take 1 capsule (300 mg total) by mouth 3 (three) times daily.  Dispense: 270 capsule; Refill: 1 - cyclobenzaprine (FLEXERIL) 5 MG tablet; Take 1 tablet (5 mg total) by mouth at bedtime.  Dispense: 30 tablet; Refill: 2 - HYDROcodone-acetaminophen (NORCO/VICODIN) 5-325 MG tablet; Take 2 tablets by mouth 2 (two) times daily as needed for moderate pain.  Dispense: 30 tablet; Refill: 0  5. Type 2 diabetes mellitus with other diabetic kidney complication, without [place]-term current use of insulin (HCC)  - lisinopril (PRINIVIL,ZESTRIL) 5 MG tablet; Take 1 tablet (5 mg total) by mouth daily.  Dispense: 90 tablet; Refill: 1 - empagliflozin (JARDIANCE) 25 MG TABS tablet; Take 25 mg by mouth daily.  Dispense: 90 tablet; Refill: 1 - sitaGLIPtin-metformin (JANUMET) 50-1000 MG tablet; Take 1 tablet by mouth 2 (two) times daily.  Dispense: 180 tablet; Refill: 1  6. Mucopurulent chronic bronchitis (HCC)  - umeclidinium-vilanterol (ANORO ELLIPTA) 62.5-25 MCG/INH AEPB; Inhale 1 puff into the lungs daily.  Dispense: 180 each; Refill: 1  7. Recurrent falls  Discussed importance of PT but she refuses, also discussed avoiding Flexeril, Hydrocodone during the day and monitor to see if we need to decrease dose of Gabapentin  8. Microalbuminuria  - lisinopril (PRINIVIL,ZESTRIL) 5 MG tablet; Take 1 tablet (5 mg total) by mouth daily.  Dispense: 90 tablet; Refill: 1  9. RLS (restless legs syndrome)  - rOPINIRole (REQUIP) 1 MG tablet; Take 1 tablet (1 mg total) by mouth daily.  Dispense: 90 tablet; Refill: 1  10. Neurogenic claudication  - gabapentin (NEURONTIN) 300 MG capsule; Take 1 capsule (300 mg  total) by mouth 3 (three) times daily.  Dispense: 270 capsule; Refill: 1 - HYDROcodone-acetaminophen (NORCO/VICODIN) 5-325 MG tablet; Take 2 tablets by mouth 2 (two) times daily as needed for moderate pain.  Dispense: 30 tablet; Refill: 0

## 2016-09-14 DIAGNOSIS — Z79899 Other long term (current) drug therapy: Secondary | ICD-10-CM | POA: Diagnosis not present

## 2016-09-14 DIAGNOSIS — L2089 Other atopic dermatitis: Secondary | ICD-10-CM | POA: Diagnosis not present

## 2016-11-21 DIAGNOSIS — R0602 Shortness of breath: Secondary | ICD-10-CM | POA: Diagnosis not present

## 2016-11-21 DIAGNOSIS — R011 Cardiac murmur, unspecified: Secondary | ICD-10-CM | POA: Diagnosis not present

## 2016-11-21 DIAGNOSIS — I208 Other forms of angina pectoris: Secondary | ICD-10-CM | POA: Diagnosis not present

## 2016-11-21 DIAGNOSIS — M4800 Spinal stenosis, site unspecified: Secondary | ICD-10-CM | POA: Diagnosis not present

## 2016-11-21 DIAGNOSIS — R2689 Other abnormalities of gait and mobility: Secondary | ICD-10-CM | POA: Diagnosis not present

## 2016-12-05 ENCOUNTER — Ambulatory Visit (INDEPENDENT_AMBULATORY_CARE_PROVIDER_SITE_OTHER): Payer: Medicare Other | Admitting: Family Medicine

## 2016-12-05 ENCOUNTER — Encounter: Payer: Self-pay | Admitting: Family Medicine

## 2016-12-05 VITALS — BP 110/62 | HR 107 | Temp 98.7°F | Resp 16 | Ht 59.0 in | Wt 177.2 lb

## 2016-12-05 DIAGNOSIS — R5383 Other fatigue: Secondary | ICD-10-CM

## 2016-12-05 DIAGNOSIS — R0602 Shortness of breath: Secondary | ICD-10-CM | POA: Diagnosis not present

## 2016-12-05 LAB — COMPLETE METABOLIC PANEL WITH GFR
ALBUMIN: 3.8 g/dL (ref 3.6–5.1)
ALK PHOS: 78 U/L (ref 33–130)
ALT: 17 U/L (ref 6–29)
AST: 15 U/L (ref 10–35)
BILIRUBIN TOTAL: 0.3 mg/dL (ref 0.2–1.2)
BUN: 11 mg/dL (ref 7–25)
CALCIUM: 8.9 mg/dL (ref 8.6–10.4)
CO2: 21 mmol/L (ref 20–31)
CREATININE: 0.66 mg/dL (ref 0.50–0.99)
Chloride: 106 mmol/L (ref 98–110)
GFR, Est African American: >89 mL/min (ref >=60)
Glucose, Bld: 113 mg/dL — ABNORMAL HIGH (ref 65–99)
Potassium: 4 mmol/L (ref 3.5–5.3)
Sodium: 141 mmol/L (ref 135–146)
TOTAL PROTEIN: 7 g/dL (ref 6.1–8.1)

## 2016-12-05 LAB — CBC WITH DIFFERENTIAL/PLATELET
BASOS PCT: 0 %
Basophils Absolute: 0 cells/uL (ref 0–200)
EOS ABS: 207 {cells}/uL (ref 15–500)
EOS PCT: 3 %
HCT: 39.7 % (ref 35.0–45.0)
Hemoglobin: 13.5 g/dL (ref 11.7–15.5)
LYMPHS PCT: 28 %
Lymphs Abs: 1932 cells/uL (ref 850–3900)
MCH: 31.6 pg (ref 27.0–33.0)
MCHC: 34 g/dL (ref 32.0–36.0)
MCV: 93 fL (ref 80.0–100.0)
MONOS PCT: 10 %
MPV: 11.1 fL (ref 7.5–12.5)
Monocytes Absolute: 690 cells/uL (ref 200–950)
Neutro Abs: 4071 cells/uL (ref 1500–7800)
Neutrophils Relative %: 59 %
PLATELETS: 250 10*3/uL (ref 140–400)
RBC: 4.27 MIL/uL (ref 3.80–5.10)
RDW: 14 % (ref 11.0–15.0)
WBC: 6.9 10*3/uL (ref 3.8–10.8)

## 2016-12-05 LAB — TSH: TSH: 1.23 mIU/L

## 2016-12-05 NOTE — Progress Notes (Signed)
Name: Julie Wall   MRN: [id number]    DOB: 1955/07/02   Date:12/05/2016       Progress Note  Subjective  Chief Complaint  Chief Complaint  Patient presents with  . not feeling well, achy    pt stated that she fell recently but insists she did not bump her head she stated that she has been feeling achy and having muscle spasms in her calves pt stated that she has not smoked in 3 days and that she has been feeling better                                     HPI  Fatigue: family asked her to be seen. She states that she developed low back pain and she took some Aleve that resolved symptoms. 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 She she felt very tired, low energy, lack of appetite. Yesterday she noticed some nausea and tightness between shoulder blade. She has DM dyslipidemia and obesity, she is a smoker but quit 3 days ago - no urge to smoke cigarettes.    Patient Active Problem List   Diagnosis Date Noted  . COPD with acute exacerbation (HCC) 02/09/2016  . Obesity (BMI 30.0-34.9) 12/13/2015  . Chronic bronchitis (HCC) 05/14/2015  . DM type 2 with diabetic peripheral neuropathy (HCC) 05/14/2015  . Positive H. pylori test 04/05/2015  . Rapid urease test for Helicobacter pylori infection positive 04/05/2015  . Umbilical hernia 04/01/2015  . Exomphalos 04/01/2015  . RLS (restless legs syndrome) 01/08/2015  . Type 2 diabetes mellitus with other diabetic kidney complication (HCC) 01/08/2015  . Neurogenic claudication 01/08/2015  . AD (atopic dermatitis) 12/28/2014  . Chronic LBP 12/28/2014  . Dyslipidemia 12/28/2014  . Gastroesophageal reflux disease without esophagitis 12/28/2014  . Microalbuminuria 12/28/2014  . Disorder of bone and cartilage 12/28/2014  . Obesity (BMI 30-39.9) 12/28/2014  . Neuralgia of left thigh 12/28/2014  . Perennial allergic rhinitis with seasonal  variation 12/28/2014  . Tobacco abuse 12/28/2014  . Hypothyroidism 09/22/2005  . Hypertension, benign 09/22/2005    Past Surgical History:  Procedure Laterality Date  . CATARACT EXTRACTION Bilateral 1980  . CESAREAN SECTION    . CORNEAL TRANSPLANT Left    [facility]-Treatment for blindness.    Family History  Problem Relation Age of Onset  . Diabetes Mother   . Hypertension Mother   . Alcohol abuse Father   . Breast cancer Neg Hx     Social History   Social History  . Marital status: Single    Spouse name: N/A  . Number of children: N/A  . Years of education: N/A   Occupational History  . Not on file.   Social History Main Topics  . Smoking status: Current Every Day Smoker    Packs/day: 0.25    Years: 32.00    Start date: 01/07/1985  . Smokeless tobacco: Never Used     Comment: trying to stop  . Alcohol use No  . Drug use: No  . Sexual activity: No   Other Topics Concern  . Not on file   Social History Narrative  . No narrative on file     Current Outpatient Prescriptions:  .  aspirin 81 MG chewable tablet, Chew 1 tablet by mouth daily., Disp: , Rfl:  .  atorvastatin (LIPITOR) 40 MG tablet, Take 1 tablet (40 mg total) by mouth daily. In place of simvastatin, Disp: 90 tablet, Rfl: 1 .  cyclobenzaprine (FLEXERIL) 5 MG tablet, Take 1 tablet (5 mg total) by mouth at bedtime., Disp: 30 tablet, Rfl: 2 .  empagliflozin (JARDIANCE) 25 MG TABS tablet, Take 25 mg by mouth daily., Disp: 90 tablet, Rfl: 1 .  folic acid (FOLVITE) 1 MG tablet, Take by mouth., Disp: , Rfl:  .  gabapentin (NEURONTIN) 300 MG capsule, Take 1 capsule (300 mg total) by mouth 3 (three) times daily., Disp: 270 capsule, Rfl: 1 .  HYDROcodone-acetaminophen (NORCO/VICODIN) 5-325 MG tablet, Take 2 tablets by mouth 2 (two) times daily as needed for moderate pain., Disp: 30 tablet, Rfl: 0 .  Ipratropium-Albuterol (COMBIVENT RESPIMAT) 20-100 MCG/ACT AERS respimat, Inhale 1 puff into the lungs 4 (four) times  daily., Disp: 1 Inhaler, Rfl: 2 .  Lancet Devices (SIMPLE DIAGNOSTICS LANCING DEV) MISC, , Disp: , Rfl:  .  lisinopril (PRINIVIL,ZESTRIL) 5 MG tablet, Take 1 tablet (5 mg total) by mouth daily., Disp: 90 tablet, Rfl: 1 .  methotrexate (RHEUMATREX) 2.5 MG tablet, Take 15 mg by mouth., Disp: , Rfl:  .  rOPINIRole (REQUIP) 1 MG tablet, Take 1 tablet (1 mg total) by mouth daily., Disp: 90 tablet, Rfl: 1 .  sitaGLIPtin-metformin (JANUMET) 50-1000 MG tablet, Take 1 tablet by mouth 2 (two) times daily., Disp: 180 tablet, Rfl: 1 .  triamcinolone cream (KENALOG) 0.1 %, TRIAMCINOLONE ACETONIDE, 0.1% (External Cream)  use per dermatologist. for 0 days  Quantity: 1.00;  Refills: 0   Ordered :15-Jun-2010  Alba Cory MD;  Started 02-Apr-2008 Active Comments: DX: 724.2, Disp: , Rfl:  .  umeclidinium-vilanterol (ANORO ELLIPTA) 62.5-25 MCG/INH AEPB, Inhale 1 puff into the lungs daily., Disp: 180 each, Rfl: 1  Allergies  Allergen Reactions  . Penicillins Rash     ROS  Constitutional: Negative for fever or significant  weight change.  Respiratory: Positive for cough and shortness of breath.   Cardiovascular: Negative for chest pain or palpitations.  Gastrointestinal: Negative for abdominal pain, no bowel changes.  Musculoskeletal: Negative for gait problem or joint swelling.  Skin: Negative for rash.  Neurological: Negative for dizziness or headache.  No other specific complaints in a complete review of systems (except as listed in HPI above).  Objective  Vitals:   12/05/16 1459  BP: 110/62  Pulse: (!) 107  Resp: 16  Temp: 98.7 F (37.1 C)  SpO2: 94%  Weight: 177 lb 3 oz (80.4 kg)  Height: 4\' 11"  (1.499 m)    Body mass index is 35.79 kg/m.  Physical Exam  Constitutional: Patient appears well-developed and well-nourished. Obese  No distress.  HEENT: head atraumatic, normocephalic, pupils equal and reactive to light,  neck supple, throat within normal limits Cardiovascular: Normal  rate, regular rhythm and normal heart sounds.  No murmur heard. No BLE edema. Pulmonary/Chest: Effort normal and breath sounds normal. No respiratory distress. Abdominal: Soft.  There is no tenderness. Psychiatric: Patient has a normal mood and affect. behavior is normal. Judgment and thought content normal.  Recent Results (from the past 2160 hour(s))  POCT HgB A1C     Status: Abnormal   Collection Time: 09/13/16  9:03 AM  Result Value Ref Range   Hemoglobin A1C 7.4      PHQ2/9: Depression screen [facility] 2/9 12/05/2016 09/13/2016 06/14/2016 03/14/2016 12/13/2015  Decreased Interest 0 0 0 0 0  Down, Depressed, Hopeless 0 0 0 0 0  PHQ - 2 Score 0 0 0 0 0     Fall Risk: Fall Risk  12/05/2016 09/13/2016 06/14/2016 03/14/2016 12/13/2015  Falls in the past year? Yes Yes No Yes No  Number falls in past yr: 2 or more 2 or more - 1 -  Injury with Fall? No No - No -  Risk Factor Category  High Fall Risk High Fall Risk - - -  Follow up - Falls evaluation completed - Falls evaluation completed -     Assessment & Plan  1. Other fatigue  - CK total and CKMB (cardiac)not at [facility] - Troponin I - TSH - Vitamin B12 - VITAMIN D 25 Hydroxy (Vit-D Deficiency, Fractures) - COMPLETE METABOLIC PANEL WITH GFR - CBC with Differential/Platelet - C-reactive protein - EKG 12-Lead  Her daughter in law was present in the exam room, they were advised to call 911 if worsening of symptoms. Reviewed symptoms of heart attack  2. SOB (shortness of breath)  Improved, stopped smoking 3 days ago  2. SOB (shortness of breath)  - EKG 12-Lead

## 2016-12-06 LAB — TROPONIN I

## 2016-12-06 LAB — CK TOTAL AND CKMB (NOT AT ARMC)
CK TOTAL: 107 U/L (ref 29–143)
CK, MB: 1.4 ng/mL (ref 0.0–5.0)
RELATIVE INDEX: 1.3 (ref 0.0–4.0)

## 2016-12-06 LAB — VITAMIN D 25 HYDROXY (VIT D DEFICIENCY, FRACTURES): Vit D, 25-Hydroxy: 11 ng/mL — ABNORMAL LOW (ref 30–100)

## 2016-12-06 LAB — VITAMIN B12: Vitamin B-12: 356 pg/mL (ref 200–1100)

## 2016-12-08 LAB — C-REACTIVE PROTEIN: CRP: 106.5 mg/L — AB (ref <8.0)

## 2016-12-13 ENCOUNTER — Ambulatory Visit (INDEPENDENT_AMBULATORY_CARE_PROVIDER_SITE_OTHER): Payer: Medicare Other | Admitting: Family Medicine

## 2016-12-13 ENCOUNTER — Encounter: Payer: Self-pay | Admitting: Family Medicine

## 2016-12-13 DIAGNOSIS — G2581 Restless legs syndrome: Secondary | ICD-10-CM

## 2016-12-13 DIAGNOSIS — E1142 Type 2 diabetes mellitus with diabetic polyneuropathy: Secondary | ICD-10-CM | POA: Diagnosis not present

## 2016-12-13 DIAGNOSIS — R5383 Other fatigue: Secondary | ICD-10-CM

## 2016-12-13 DIAGNOSIS — M48062 Spinal stenosis, lumbar region with neurogenic claudication: Secondary | ICD-10-CM | POA: Diagnosis not present

## 2016-12-13 DIAGNOSIS — R7982 Elevated C-reactive protein (CRP): Secondary | ICD-10-CM

## 2016-12-13 DIAGNOSIS — J411 Mucopurulent chronic bronchitis: Secondary | ICD-10-CM | POA: Diagnosis not present

## 2016-12-13 DIAGNOSIS — M2560 Stiffness of unspecified joint, not elsewhere classified: Secondary | ICD-10-CM

## 2016-12-13 DIAGNOSIS — G8929 Other chronic pain: Secondary | ICD-10-CM | POA: Diagnosis not present

## 2016-12-13 DIAGNOSIS — E785 Hyperlipidemia, unspecified: Secondary | ICD-10-CM | POA: Diagnosis not present

## 2016-12-13 DIAGNOSIS — G5602 Carpal tunnel syndrome, left upper limb: Secondary | ICD-10-CM | POA: Diagnosis not present

## 2016-12-13 DIAGNOSIS — G9519 Other vascular myelopathies: Secondary | ICD-10-CM

## 2016-12-13 DIAGNOSIS — M545 Low back pain: Secondary | ICD-10-CM

## 2016-12-13 DIAGNOSIS — R29818 Other symptoms and signs involving the nervous system: Secondary | ICD-10-CM

## 2016-12-13 DIAGNOSIS — E1129 Type 2 diabetes mellitus with other diabetic kidney complication: Secondary | ICD-10-CM | POA: Diagnosis not present

## 2016-12-13 LAB — POCT UA - MICROALBUMIN: MICROALBUMIN (UR) POC: 20 mg/L

## 2016-12-13 LAB — POCT GLYCOSYLATED HEMOGLOBIN (HGB A1C): Hemoglobin A1C: 7

## 2016-12-13 MED ORDER — ROPINIROLE HCL 2 MG PO TABS: 2.0000 mg | ORAL_TABLET | Freq: Every day | ORAL | 0 refills | Status: DC

## 2016-12-13 MED ORDER — HYDROCODONE-ACETAMINOPHEN 5-325 MG PO TABS: 2.0000 | ORAL_TABLET | Freq: Two times a day (BID) | ORAL | 0 refills | Status: DC | PRN

## 2016-12-13 NOTE — Progress Notes (Signed)
Name: Julie Wall   MRN: [id number]    DOB: 1955-06-17   Date:12/13/2016       Progress Note  Subjective  Chief Complaint  Chief Complaint  Patient presents with  . Medication Refill    3 month F/U  . Diabetes    Checks sugar once daily Low-95 Average-118 High-135  . Hypertension  . COPD  . RLS    HPI  DMII with neuropathy and proteinuria: she has neurogenic claudication and history of proteinuria.. She is drinking diet sodas, she skips meals, because she has a lack of appetite. Her hgbA1C hadgone down from 7.5% to 6.9% ,up to 7. 2 , 7.4% down to 7.0%  Denies polyphagia, polydipsia but she has noticed polyuria with Jardiance. She is on gabapentin and states leg pain is stable, also takes ACE for microalbuminuria.   Hyperlipidemia: taking Atorvastatin. Denies side effects of medications. Explained the importance of continue on medication to decrease risk of heart attacks or strokes. Last LDL was at goal   COPD: she quit smoking 12/03/2016. She denies daily cough, but she still wheezes twice a week, she has intermittent SOB a couple times a week. She is feeling better since she quit smoking and stopped using Anoro, but still uses Combivent prn    Back pain: she has back pain a few times a week, pain now 5/10, occasionally goes higher and takes hydrocodone prn. Worse with increase in activity, taking hydrocodone ( prescription lasts 3-4 months ) she stopped taking Flexeril.. She denies leg weakness, no bowel or bladder incontinence, she has numbness on left lateral thigh. Discussed risk of addiction and importance of no diversion. We decreased Hydrocodone to 30 pills to last 3 months, Fall 2017 and she is doing well, with prn medication.Advised to take Tylenol daily   RLS: She has been taking Requip at higher dose 2 mg given by at night, she still has leg pain, aching that is worse at night, also taking gabapentin. She denies side effects of medication.   Atopic Dermatitis: doing  much better on methotrexate continues to see [facility] dermatologist. Taking folic acid.   Fatigue:  She was seen in our office on 12/05/2016 because three  days prior she developed fatigue,  aching legs, and it got progressively worse ( heavy and tight) She went to church the following morning and she felt worse, with very low energy, lack of appetite followed by she noticed some nausea and tightness between shoulder blades. She quit smoking because she got scared. We checked labs and showed very high c-reactive protein, she denies joint pains or swelling, but has joint stiffness that lasts about 15 minutes in the mornings. She had a negative myoview done by Dr. Clayborn Bigness 05/2016    Patient Active Problem List   Diagnosis Date Noted  . Obesity (BMI 30.0-34.9) 12/13/2015  . Chronic bronchitis ([place]) 05/14/2015  . DM type 2 with diabetic peripheral neuropathy ([place]) 05/14/2015  . Positive H. pylori test 04/05/2015  . Rapid urease test for Helicobacter pylori infection positive 04/05/2015  . Umbilical hernia 41/66/0630  . Exomphalos 04/01/2015  . RLS (restless legs syndrome) 01/08/2015  . Type 2 diabetes mellitus with other diabetic kidney complication ([place]) 16/06/930  . Neurogenic claudication 01/08/2015  . AD (atopic dermatitis) 12/28/2014  . Chronic LBP 12/28/2014  . Dyslipidemia 12/28/2014  . Gastroesophageal reflux disease without esophagitis 12/28/2014  . Microalbuminuria 12/28/2014  . Disorder of bone and cartilage 12/28/2014  . Obesity (BMI 30-39.9) 12/28/2014  . Neuralgia of left  thigh 12/28/2014  . Perennial allergic rhinitis with seasonal variation 12/28/2014  . Tobacco abuse 12/28/2014  . Hypothyroidism 09/22/2005  . Hypertension, benign 09/22/2005    Past Surgical History:  Procedure Laterality Date  . CATARACT EXTRACTION Bilateral 1980  . CESAREAN SECTION    . CORNEAL TRANSPLANT Left    [facility]-Treatment for blindness.    Family History  Problem Relation Age of Onset  .  Diabetes Mother   . Hypertension Mother   . Alcohol abuse Father   . Breast cancer Neg Hx     Social History   Social History  . Marital status: Single    Spouse name: N/A  . Number of children: N/A  . Years of education: N/A   Occupational History  . Not on file.   Social History Main Topics  . Smoking status: Former Smoker    Packs/day: 0.25    Years: 32.00    Types: Cigarettes    Start date: 01/07/1985    Quit date: 12/03/2016  . Smokeless tobacco: Never Used     Comment: trying to stop  . Alcohol use No  . Drug use: No  . Sexual activity: No   Other Topics Concern  . Not on file   Social History Narrative  . No narrative on file     Current Outpatient Prescriptions:  .  aspirin 81 MG chewable tablet, Chew 1 tablet by mouth daily., Disp: , Rfl:  .  atorvastatin (LIPITOR) 40 MG tablet, Take 1 tablet (40 mg total) by mouth daily. In place of simvastatin, Disp: 90 tablet, Rfl: 1 .  empagliflozin (JARDIANCE) 25 MG TABS tablet, Take 25 mg by mouth daily., Disp: 90 tablet, Rfl: 1 .  folic acid (FOLVITE) 1 MG tablet, Take by mouth., Disp: , Rfl:  .  gabapentin (NEURONTIN) 300 MG capsule, Take 1 capsule (300 mg total) by mouth 3 (three) times daily., Disp: 270 capsule, Rfl: 1 .  HYDROcodone-acetaminophen (NORCO/VICODIN) 5-325 MG tablet, Take 2 tablets by mouth 2 (two) times daily as needed for moderate pain., Disp: 30 tablet, Rfl: 0 .  Ipratropium-Albuterol (COMBIVENT RESPIMAT) 20-100 MCG/ACT AERS respimat, Inhale 1 puff into the lungs 4 (four) times daily., Disp: 1 Inhaler, Rfl: 2 .  Lancet Devices (SIMPLE DIAGNOSTICS LANCING DEV) MISC, , Disp: , Rfl:  .  lisinopril (PRINIVIL,ZESTRIL) 5 MG tablet, Take 1 tablet (5 mg total) by mouth daily., Disp: 90 tablet, Rfl: 1 .  methotrexate (RHEUMATREX) 2.5 MG tablet, Take 15 mg by mouth., Disp: , Rfl:  .  rOPINIRole (REQUIP) 2 MG tablet, Take 1 tablet (2 mg total) by mouth daily., Disp: 30 tablet, Rfl: 0 .  sitaGLIPtin-metformin  (JANUMET) 50-1000 MG tablet, Take 1 tablet by mouth 2 (two) times daily., Disp: 180 tablet, Rfl: 1 .  triamcinolone cream (KENALOG) 0.1 %, TRIAMCINOLONE ACETONIDE, 0.1% (External Cream)  use per dermatologist. for 0 days  Quantity: 1.00;  Refills: 0   Ordered :15-Jun-2010  Steele Sizer MD;  Started 02-Apr-2008 Active Comments: DX: 724.2, Disp: , Rfl:  .  umeclidinium-vilanterol (ANORO ELLIPTA) 62.5-25 MCG/INH AEPB, Inhale 1 puff into the lungs daily., Disp: 180 each, Rfl: 1  Allergies  Allergen Reactions  . Penicillins Rash     ROS  Constitutional: Negative for fever or weight change.  Respiratory: Negative for cough and shortness of breath.   Cardiovascular: Negative for chest pain or palpitations.  Gastrointestinal: Negative for abdominal pain, no bowel changes.  Musculoskeletal: Negative for gait problem or joint swelling.  Skin: Negative  for rash.  Neurological: Negative for dizziness or headache.  No other specific complaints in a complete review of systems (except as listed in HPI above).  Objective  Vitals:   12/13/16 1020  BP: 116/74  Pulse: (!) 115  Resp: 16  Temp: (!) 97 F (36.1 C)  TempSrc: Oral  SpO2: 93%  Weight: 179 lb 6.4 oz (81.4 kg)  Height: 4' 11"  (1.499 m)    Body mass index is 36.23 kg/m.  Physical Exam  Constitutional: Patient appears well-developed and well-nourished. Obese No distress.  HEENT: head atraumatic, normocephalic, pupils equal and reactive to light, neck supple, throat within normal limits Cardiovascular: Normal rate, regular rhythm and normal heart sounds.  No murmur heard. No BLE edema. Pulmonary/Chest: Effort normal she has end expiratory wheezing on both lung fields No respiratory distress. Abdominal: Soft.  There is no tenderness. Psychiatric: Patient has a normal mood and affect. behavior is normal. Judgment and thought content normal.  Recent Results (from the past 2160 hour(s))  CK total and CKMB (cardiac)not at [facility]      Status: None   Collection Time: 12/05/16  3:41 PM  Result Value Ref Range   Total CK 107 29 - 143 U/L   CK, MB 1.4 0.0 - 5.0 ng/mL   Relative Index 1.3 0.0 - 4.0  Troponin I     Status: None   Collection Time: 12/05/16  3:41 PM  Result Value Ref Range   Troponin I <0.01 <=0.05 ng/mL    Comment:   In accord with published recommendations, serial testing of Troponin I at intervals of 2 to 4 hours for up to 12 to 24 hours is suggested in order to corroborate a single Troponin I result. An elevated troponin alone is not sufficient to make the diagnosis of MI. ** Please note change in reference range(s). **     TSH     Status: None   Collection Time: 12/05/16  3:41 PM  Result Value Ref Range   TSH 1.23 mIU/L    Comment:   Reference Range   > or = 20 Years  0.40-4.50   Pregnancy Range First trimester  0.26-2.66 Second trimester 0.55-2.73 Third trimester  0.43-2.91     Vitamin B12     Status: None   Collection Time: 12/05/16  3:41 PM  Result Value Ref Range   Vitamin B-12 356 200 - 1,100 pg/mL  VITAMIN D 25 Hydroxy (Vit-D Deficiency, Fractures)     Status: Abnormal   Collection Time: 12/05/16  3:41 PM  Result Value Ref Range   Vit D, 25-Hydroxy 11 (L) 30 - 100 ng/mL    Comment: Vitamin D Status           25-OH Vitamin D        Deficiency                <20 ng/mL        Insufficiency         20 - 29 ng/mL        Optimal             > or = 30 ng/mL   For 25-OH Vitamin D testing on patients on D2-supplementation and patients for whom quantitation of D2 and D3 fractions is required, the Quest[employer]ureD 25-OH VIT D, (D2,D3), LC/MS/MS is recommended: order code [id number] (patients > 2 yrs).   COMPLETE METABOLIC PANEL WITH GFR     Status: Abnormal   Collection Time: 12/05/16  3:41 PM  Result Value Ref Range   Sodium 141 135 - 146 mmol/L   Potassium 4.0 3.5 - 5.3 mmol/L   Chloride 106 98 - 110 mmol/L   CO2 21 20 - 31 mmol/L   Glucose, Bld 113 (H) 65 - 99 mg/dL   BUN 11 7 - 25  mg/dL   Creat 0.66 0.50 - 0.99 mg/dL    Comment:   For patients > or = 62 years of age: The upper reference limit for Creatinine is approximately 13% higher for people identified as African-American.      Total Bilirubin 0.3 0.2 - 1.2 mg/dL   Alkaline Phosphatase 78 33 - 130 U/L   AST 15 10 - 35 U/L   ALT 17 6 - 29 U/L   Total Protein 7.0 6.1 - 8.1 g/dL   Albumin 3.8 3.6 - 5.1 g/dL   Calcium 8.9 8.6 - 10.4 mg/dL   GFR, Est African American >89 >=60 mL/min   GFR, Est Non African American >89 >=60 mL/min  CBC with Differential/Platelet     Status: None   Collection Time: 12/05/16  3:41 PM  Result Value Ref Range   WBC 6.9 3.8 - 10.8 K/uL   RBC 4.27 3.80 - 5.10 MIL/uL   Hemoglobin 13.5 11.7 - 15.5 g/dL   HCT 39.7 35.0 - 45.0 %   MCV 93.0 80.0 - 100.0 fL   MCH 31.6 27.0 - 33.0 pg   MCHC 34.0 32.0 - 36.0 g/dL   RDW 14.0 11.0 - 15.0 %   Platelets 250 140 - 400 K/uL   MPV 11.1 7.5 - 12.5 fL   Neutro Abs 4,071 1,500 - 7,800 cells/uL   Lymphs Abs 1,932 850 - 3,900 cells/uL   Monocytes Absolute 690 200 - 950 cells/uL   Eosinophils Absolute 207 15 - 500 cells/uL   Basophils Absolute 0 0 - 200 cells/uL   Neutrophils Relative % 59 %   Lymphocytes Relative 28 %   Monocytes Relative 10 %   Eosinophils Relative 3 %   Basophils Relative 0 %   Smear Review Criteria for review not met   C-reactive protein     Status: Abnormal   Collection Time: 12/05/16  3:41 PM  Result Value Ref Range   CRP 106.5 (H) <8.0 mg/L  POCT HgB A1C     Status: Abnormal   Collection Time: 12/13/16 10:24 AM  Result Value Ref Range   Hemoglobin A1C 7.0   POCT UA - Microalbumin     Status: None   Collection Time: 12/13/16 10:24 AM  Result Value Ref Range   Microalbumin Ur, POC 20 mg/L   Creatinine, POC  mg/dL   Albumin/Creatinine Ratio, Urine, POC       PHQ2/9: Depression screen [facility] 2/9 12/05/2016 09/13/2016 06/14/2016 03/14/2016 12/13/2015  Decreased Interest 0 0 0 0 0  Down, Depressed, Hopeless 0 0 0 0 0   PHQ - 2 Score 0 0 0 0 0    Fall Risk: Fall Risk  12/05/2016 09/13/2016 06/14/2016 03/14/2016 12/13/2015  Falls in the past year? Yes Yes No Yes No  Number falls in past yr: 2 or more 2 or more - 1 -  Injury with Fall? No No - No -  Risk Factor Category  High Fall Risk High Fall Risk - - -  Follow up - Falls evaluation completed - Falls evaluation completed -     Functional Status Survey: Is the patient deaf or have difficulty hearing?: No Does the patient have  difficulty seeing, even when wearing glasses/contacts?: No Does the patient have difficulty concentrating, remembering, or making decisions?: No Does the patient have difficulty walking or climbing stairs?: No Does the patient have difficulty dressing or bathing?: No Does the patient have difficulty doing errands alone such as visiting a doctor's office or shopping?: Yes (Does not drive)    Assessment & Plan  1. Type 2 diabetes mellitus with other diabetic kidney complication (HCC)  - POCT HgB A1C - POCT UA - Microalbumin  2. Other fatigue  - C-reactive protein - Sedimentation rate - Rheumatoid Factor - Lupus anticoagulant panel   3. DM type 2 with diabetic peripheral neuropathy ([place])   4. Dyslipidemia  On   5. Mucopurulent chronic bronchitis ([place])  Quit smoking one week ago   6. Left carpal tunnel syndrome  improved  7. RLS (restless legs syndrome)  Diagnosed by Dr. Melrose Nakayama - neurologist -   8. Elevated C-reactive protein  - C-reactive protein - Sedimentation rate  9. Joint stiffness  - C-reactive protein - Sedimentation rate - Rheumatoid Factor - Lupus anticoagulant panel  10. Neurogenic claudication  - HYDROcodone-acetaminophen (NORCO/VICODIN) 5-325 MG tablet; Take 2 tablets by mouth 2 (two) times daily as needed for moderate pain.  Dispense: 30 tablet; Refill: 0  11. Chronic bilateral low back pain without sciatica  - HYDROcodone-acetaminophen (NORCO/VICODIN) 5-325 MG tablet; Take 2  tablets by mouth 2 (two) times daily as needed for moderate pain.  Dispense: 30 tablet; Refill: 0

## 2016-12-14 ENCOUNTER — Telehealth: Payer: Self-pay | Admitting: Family Medicine

## 2016-12-14 DIAGNOSIS — M791 Myalgia: Secondary | ICD-10-CM | POA: Diagnosis not present

## 2016-12-14 DIAGNOSIS — G2581 Restless legs syndrome: Secondary | ICD-10-CM | POA: Diagnosis not present

## 2016-12-14 DIAGNOSIS — E559 Vitamin D deficiency, unspecified: Secondary | ICD-10-CM | POA: Insufficient documentation

## 2016-12-14 DIAGNOSIS — G5602 Carpal tunnel syndrome, left upper limb: Secondary | ICD-10-CM | POA: Diagnosis not present

## 2016-12-14 LAB — SEDIMENTATION RATE: SED RATE: 10 mm/h (ref 0–30)

## 2016-12-14 LAB — C-REACTIVE PROTEIN: CRP: 12.5 mg/L — AB (ref <8.0)

## 2016-12-14 LAB — RHEUMATOID FACTOR: Rhuematoid fact SerPl-aCnc: <14 IU/mL (ref <14)

## 2016-12-14 NOTE — Telephone Encounter (Signed)
Julie SilversmithElizabeth Ouma, NP Called about patient, [phone] Patient's primary is out of the office, so I am returning her call Seeing her for RLS and neuropathy Symptoms not likely to RLS she says; needs labs Vitamin D was quite low; Rx was recommended but I do not see it in her med list Julie SilversmithElizabeth Ouma, NP says that she'll send a prescription now for 50k iu vit D once a week for 8 weeks, then wants 1,000 D3 after that daily We'll recheck vit D level 1-2 weeks after last Rx pill

## 2016-12-14 NOTE — Assessment & Plan Note (Signed)
Check in about 9-10 weeks after completing 8 weeks of Rx ergocalciferol

## 2016-12-16 LAB — RFX PTT-LA W/RFX TO HEX PHASE CONF: PTT-LA Screen: 41 s — ABNORMAL HIGH (ref <=40)

## 2016-12-16 LAB — RFLX HEXAGONAL PHASE CONFIRM: HEXAGONAL PHASE CONFIRM: NEGATIVE

## 2016-12-16 LAB — LUPUS ANTICOAGULANT PANEL

## 2016-12-16 LAB — RFX DRVVT SCR W/RFLX CONF 1:1 MIX: dRVVT Screen: 42 s (ref <=45)

## 2016-12-25 NOTE — Telephone Encounter (Signed)
Please see order for vit D, covering for you while you were out; just need the order entered and signed by you for 02/08/17 then close out the note; thank you

## 2016-12-27 DIAGNOSIS — E559 Vitamin D deficiency, unspecified: Secondary | ICD-10-CM | POA: Diagnosis not present

## 2016-12-27 NOTE — Telephone Encounter (Signed)
Pt stated that she would come back in for repeat blood work

## 2016-12-28 DIAGNOSIS — L2089 Other atopic dermatitis: Secondary | ICD-10-CM | POA: Diagnosis not present

## 2016-12-28 LAB — VITAMIN D 25 HYDROXY (VIT D DEFICIENCY, FRACTURES): Vit D, 25-Hydroxy: 32 ng/mL (ref 30–100)

## 2017-02-12 ENCOUNTER — Ambulatory Visit (INDEPENDENT_AMBULATORY_CARE_PROVIDER_SITE_OTHER): Payer: Medicare Other | Admitting: Family Medicine

## 2017-02-12 ENCOUNTER — Ambulatory Visit: Payer: Medicare Other

## 2017-02-12 DIAGNOSIS — E1129 Type 2 diabetes mellitus with other diabetic kidney complication: Secondary | ICD-10-CM

## 2017-02-14 NOTE — Progress Notes (Signed)
Not seen

## 2017-03-16 ENCOUNTER — Encounter: Payer: Self-pay | Admitting: Family Medicine

## 2017-03-16 ENCOUNTER — Ambulatory Visit (INDEPENDENT_AMBULATORY_CARE_PROVIDER_SITE_OTHER): Payer: Medicare Other | Admitting: Family Medicine

## 2017-03-16 VITALS — BP 85/50 | HR 98 | Temp 98.3°F | Resp 18 | Ht 58.27 in | Wt 179.0 lb

## 2017-03-16 DIAGNOSIS — R7982 Elevated C-reactive protein (CRP): Secondary | ICD-10-CM

## 2017-03-16 DIAGNOSIS — R809 Proteinuria, unspecified: Secondary | ICD-10-CM | POA: Diagnosis not present

## 2017-03-16 DIAGNOSIS — M545 Low back pain, unspecified: Secondary | ICD-10-CM

## 2017-03-16 DIAGNOSIS — E1129 Type 2 diabetes mellitus with other diabetic kidney complication: Secondary | ICD-10-CM

## 2017-03-16 DIAGNOSIS — E559 Vitamin D deficiency, unspecified: Secondary | ICD-10-CM | POA: Diagnosis not present

## 2017-03-16 DIAGNOSIS — E785 Hyperlipidemia, unspecified: Secondary | ICD-10-CM

## 2017-03-16 DIAGNOSIS — J441 Chronic obstructive pulmonary disease with (acute) exacerbation: Secondary | ICD-10-CM | POA: Diagnosis not present

## 2017-03-16 DIAGNOSIS — M48062 Spinal stenosis, lumbar region with neurogenic claudication: Secondary | ICD-10-CM

## 2017-03-16 DIAGNOSIS — G8929 Other chronic pain: Secondary | ICD-10-CM

## 2017-03-16 DIAGNOSIS — G2581 Restless legs syndrome: Secondary | ICD-10-CM

## 2017-03-16 DIAGNOSIS — R29818 Other symptoms and signs involving the nervous system: Secondary | ICD-10-CM

## 2017-03-16 DIAGNOSIS — Z23 Encounter for immunization: Secondary | ICD-10-CM | POA: Diagnosis not present

## 2017-03-16 DIAGNOSIS — E1142 Type 2 diabetes mellitus with diabetic polyneuropathy: Secondary | ICD-10-CM | POA: Diagnosis not present

## 2017-03-16 DIAGNOSIS — Z79899 Other long term (current) drug therapy: Secondary | ICD-10-CM | POA: Diagnosis not present

## 2017-03-16 DIAGNOSIS — J411 Mucopurulent chronic bronchitis: Secondary | ICD-10-CM | POA: Diagnosis not present

## 2017-03-16 DIAGNOSIS — G9519 Other vascular myelopathies: Secondary | ICD-10-CM

## 2017-03-16 MED ORDER — HYDROCODONE-ACETAMINOPHEN 5-325 MG PO TABS: 2.0000 | ORAL_TABLET | Freq: Two times a day (BID) | ORAL | 0 refills | Status: DC | PRN

## 2017-03-16 MED ORDER — EMPAGLIFLOZIN 25 MG PO TABS: 25.0000 mg | ORAL_TABLET | Freq: Every day | ORAL | 1 refills | Status: DC

## 2017-03-16 MED ORDER — GABAPENTIN 300 MG PO CAPS: 300.0000 mg | ORAL_CAPSULE | Freq: Three times a day (TID) | ORAL | 1 refills | Status: DC

## 2017-03-16 MED ORDER — SITAGLIPTIN PHOS-METFORMIN HCL 50-1000 MG PO TABS: 1.0000 | ORAL_TABLET | Freq: Two times a day (BID) | ORAL | 1 refills | Status: DC

## 2017-03-16 MED ORDER — LISINOPRIL 5 MG PO TABS: 5.0000 mg | ORAL_TABLET | Freq: Every day | ORAL | 1 refills | Status: DC

## 2017-03-16 MED ORDER — ATORVASTATIN CALCIUM 40 MG PO TABS: 40.0000 mg | ORAL_TABLET | Freq: Every day | ORAL | 1 refills | Status: DC

## 2017-03-16 MED ORDER — UMECLIDINIUM-VILANTEROL 62.5-25 MCG/INH IN AEPB: 1.0000 | INHALATION_SPRAY | Freq: Every day | RESPIRATORY_TRACT | 1 refills | Status: DC

## 2017-03-16 NOTE — Addendum Note (Signed)
Addended by: Cynda Familia on: 03/16/2017 12:50 PM   Modules accepted: Orders

## 2017-03-16 NOTE — Progress Notes (Signed)
Name: Julie Wall   MRN: [id number]    DOB: 07/27/1955   Date:03/16/2017       Progress Note  Subjective  Chief Complaint  Chief Complaint  Patient presents with  . Medication Refill  . Diabetes    Checks weekly, Lowest-96 Average-134 Highest-205  . Hyperlipidemia  . COPD    Has been coughing and wheezing since she started back smoking. Cigarettes help keep her calm  . RLS    Unchanged, is getting better some mornings  . Back Pain    Unchanged    HPI  DMII with neuropathy and proteinuria: she has neurogenic claudication and history of proteinuria.. She is drinking mostly water she is still skipping meals. because she has a lack of appetite. Her hgbA1C hadgone down from 7.5% to 6.9% ,up to 7. 2 , 7.4% down to 7.0%, [facility].  Denies polyphagia, polydipsia but she has noticed polyuria with Jardiance. She is on gabapentin and states leg pain is stable, also takes ACE for microalbuminuria. BP is low today, denies dizziness, discussed changing from jardiance to GLP-1 agonist but she is afraid of injections and would like to continue current regiment.   Hyperlipidemia: taking Atorvastatin. Denies side effects of medications. Explained the importance of continue on medication to decrease risk of heart attacks or strokes. Last LDL was at goal   COPD: she quit smoking 12/03/2016. She denies daily cough,, only a smoking a few cigarettes daily, stopped all inhalers and over the past week has noticed increase in wheezing and mild dry cough, has some SOB with activity, but not severe. Advised to resume Anoro and monitor for now  Back pain: she has back pain a few times a week, pain now 5/10, occasionally goes higher and takes hydrocodone prn. Worse with increase in activity, taking hydrocodone ( prescription lasts 3-4 months ) she stopped taking Flexeril.. She denies leg weakness, no bowel or bladder incontinence, she has numbness on left lateral thigh. Discussed risk  of addiction and importance of no diversion. We decreased Hydrocodone to 30 pills to last 3 months, Fall 2017 and she is doing well, with prn medication.Advised to take Tylenol daily. Paresthesia on leg likely from neuralgia paresthetica   RLS: She has been taking Requip at higher dose 2 mg given by at night, she still has leg pain, aching that is worse at night, also taking gabapentin. She denies side effects of medication.   Atopic Dermatitis: doing much better on methotrexate continues to see [facility] dermatologist. Taking folic acid.   Fatigue:  She was seen in our office on 12/05/2016 because three  days prior she developed fatigue,  aching legs, and it got progressively worse ( heavy and tight) She went to church the following morning and she felt worse, with very low energy, lack of appetite followed by she noticed some nausea and tightness between shoulder blades. She quit smoking because she got scared, but is smoking again now. . We checked labs and showed very high c-reactive protein, she denies joint pains or swelling, but has joint stiffness that lasts about 15 minutes in the mornings, all labs were normal and crp improved, fatigue is back to baseline now. She had a negative myoview done by Dr. Juliann Pares 05/2016   Patient Active Problem List   Diagnosis Date Noted  . Vitamin D deficiency 12/14/2016  . Obesity (BMI 30.0-34.9) 12/13/2015  . Chronic bronchitis (HCC) 05/14/2015  . DM type 2 with diabetic peripheral neuropathy (HCC) 05/14/2015  .  Positive H. pylori test 04/05/2015  . Rapid urease test for Helicobacter pylori infection positive 04/05/2015  . Umbilical hernia 04/01/2015  . Exomphalos 04/01/2015  . RLS (restless legs syndrome) 01/08/2015  . Type 2 diabetes mellitus with other diabetic kidney complication (HCC) 01/08/2015  . Neurogenic claudication 01/08/2015  . AD (atopic dermatitis) 12/28/2014  . Chronic LBP 12/28/2014  . Dyslipidemia 12/28/2014  . Gastroesophageal  reflux disease without esophagitis 12/28/2014  . Microalbuminuria 12/28/2014  . Disorder of bone and cartilage 12/28/2014  . Neuralgia of left thigh 12/28/2014  . Perennial allergic rhinitis with seasonal variation 12/28/2014  . Tobacco abuse 12/28/2014  . Hypothyroidism 09/22/2005  . Hypertension, benign 09/22/2005    Past Surgical History:  Procedure Laterality Date  . CATARACT EXTRACTION Bilateral 1980  . CESAREAN SECTION    . CORNEAL TRANSPLANT Left    [facility]-Treatment for blindness.    Family History  Problem Relation Age of Onset  . Diabetes Mother   . Hypertension Mother   . Alcohol abuse Father   . Breast cancer Neg Hx     Social History   Social History  . Marital status: Single    Spouse name: N/A  . Number of children: N/A  . Years of education: N/A   Occupational History  . Not on file.   Social History Main Topics  . Smoking status: Current Every Day Smoker    Packs/day: 0.25    Years: 32.00    Types: Cigarettes    Start date: 01/07/1985  . Smokeless tobacco: Never Used  . Alcohol use No  . Drug use: No  . Sexual activity: No   Other Topics Concern  . Not on file   Social History Narrative  . No narrative on file     Current Outpatient Prescriptions:  .  aspirin 81 MG chewable tablet, Chew 1 tablet by mouth daily., Disp: , Rfl:  .  empagliflozin (JARDIANCE) 25 MG TABS tablet, Take 25 mg by mouth daily., Disp: 90 tablet, Rfl: 1 .  folic acid (FOLVITE) 1 MG tablet, Take by mouth., Disp: , Rfl:  .  gabapentin (NEURONTIN) 300 MG capsule, Take 1 capsule (300 mg total) by mouth 3 (three) times daily., Disp: 270 capsule, Rfl: 1 .  HYDROcodone-acetaminophen (NORCO/VICODIN) 5-325 MG tablet, Take 2 tablets by mouth 2 (two) times daily as needed for moderate pain., Disp: 30 tablet, Rfl: 0 .  Lancet Devices (SIMPLE DIAGNOSTICS LANCING DEV) MISC, , Disp: , Rfl:  .  methotrexate (RHEUMATREX) 2.5 MG tablet, Take 15 mg by mouth once a week. , Disp: , Rfl:  .   rOPINIRole (REQUIP) 2 MG tablet, Take 1 tablet (2 mg total) by mouth daily., Disp: 30 tablet, Rfl: 0 .  sitaGLIPtin-metformin (JANUMET) 50-1000 MG tablet, Take 1 tablet by mouth 2 (two) times daily., Disp: 180 tablet, Rfl: 1 .  triamcinolone cream (KENALOG) 0.1 %, TRIAMCINOLONE ACETONIDE, 0.1% (External Cream)  use per dermatologist. for 0 days  Quantity: 1.00;  Refills: 0   Ordered :15-Jun-2010  Alba Cory MD;  Started 02-Apr-2008 Active Comments: DX: 724.2, Disp: , Rfl:  .  atorvastatin (LIPITOR) 40 MG tablet, Take 1 tablet (40 mg total) by mouth daily. In place of simvastatin, Disp: 90 tablet, Rfl: 1 .  lisinopril (PRINIVIL,ZESTRIL) 5 MG tablet, Take 1 tablet (5 mg total) by mouth daily., Disp: 90 tablet, Rfl: 1 .  umeclidinium-vilanterol (ANORO ELLIPTA) 62.5-25 MCG/INH AEPB, Inhale 1 puff into the lungs daily., Disp: 180 each, Rfl: 1  Allergies  Allergen Reactions  . Penicillins Rash     ROS  Constitutional: Negative for fever or weight change.  Respiratory: Positive for cough and shortness of breath.   Cardiovascular: Negative for chest pain or palpitations.  Gastrointestinal: Negative for abdominal pain, no bowel changes.  Musculoskeletal: Negative for gait problem or joint swelling.  Skin: Negative for rash.  Neurological: Negative for dizziness or headache.  No other specific complaints in a complete review of systems (except as listed in HPI above).  Objective  Vitals:   03/16/17 1126 03/16/17 1131  BP: (!) 98/46 (!) 85/50  Pulse: 98   Resp: 18   Temp: 98.3 F (36.8 C)   TempSrc: Oral   SpO2: 95%   Weight: 179 lb (81.2 kg)   Height: 4' 10.27" (1.48 m)     Body mass index is 37.07 kg/m.  Physical Exam  Constitutional: Patient appears well-developed and well-nourished. Obese No distress.  HEENT: head atraumatic, normocephalic, pupils equal and reactive to light, neck supple, throat within normal limits Cardiovascular: Normal rate, regular rhythm and normal  heart sounds.  No murmur heard. No BLE edema. Pulmonary/Chest: Effort normal she has end inspiratory wheezing on both lung fields No respiratory distress. Abdominal: Soft.  There is no tenderness. Psychiatric: Patient has a normal mood and affect. behavior is normal. Judgment and thought content normal. Skin: still has area of hyperpigmentation on face, and some areas of hypopigmentation from scarring, but skin is much better than used to be  Recent Results (from the past 2160 hour(s))  VITAMIN D 25 Hydroxy (Vit-D Deficiency, Fractures)     Status: None   Collection Time: 12/27/16  2:43 PM  Result Value Ref Range   Vit D, 25-Hydroxy 32 30 - 100 ng/mL    Comment: Vitamin D Status           25-OH Vitamin D        Deficiency                <20 ng/mL        Insufficiency         20 - 29 ng/mL        Optimal             > or = 30 ng/mL   For 25-OH Vitamin D testing on patients on D2-supplementation and patients for whom quantitation of D2 and D3 fractions is required, the [facility] 25-OH VIT D, (D2,D3), LC/MS/MS is recommended: order code 29562 (patients > 2 yrs).       PHQ2/9: Depression screen [facility] 2/9 03/16/2017 12/05/2016 09/13/2016 06/14/2016 03/14/2016  Decreased Interest 0 0 0 0 0  Down, Depressed, Hopeless 0 0 0 0 0  PHQ - 2 Score 0 0 0 0 0     Fall Risk: Fall Risk  03/16/2017 12/05/2016 09/13/2016 06/14/2016 03/14/2016  Falls in the past year? No Yes Yes No Yes  Number falls in past yr: - 2 or more 2 or more - 1  Injury with Fall? - No No - No  Risk Factor Category  - High Fall Risk High Fall Risk - -  Follow up - - Falls evaluation completed - Falls evaluation completed    Functional Status Survey: Is the patient deaf or have difficulty hearing?: No Does the patient have difficulty seeing, even when wearing glasses/contacts?: Yes Does the patient have difficulty concentrating, remembering, or making decisions?: No Does the patient have difficulty walking or climbing  stairs?: No Does the patient have difficulty  dressing or bathing?: No Does the patient have difficulty doing errands alone such as visiting a doctor's office or shopping?: Yes    Assessment & Plan  1. Type 2 diabetes mellitus with other diabetic kidney complication, without [place]-term current use of insulin (HCC)  - lisinopril (PRINIVIL,ZESTRIL) 5 MG tablet; Take 1 tablet (5 mg total) by mouth daily.  Dispense: 90 tablet; Refill: 1 - sitaGLIPtin-metformin (JANUMET) 50-1000 MG tablet; Take 1 tablet by mouth 2 (two) times daily.  Dispense: 180 tablet; Refill: 1 - empagliflozin (JARDIANCE) 25 MG TABS tablet; Take 25 mg by mouth daily.  Dispense: 90 tablet; Refill: 1 - Hemoglobin A1c  2. Dyslipidemia  - atorvastatin (LIPITOR) 40 MG tablet; Take 1 tablet (40 mg total) by mouth daily. In place of simvastatin  Dispense: 90 tablet; Refill: 1 - Lipid panel  3. Microalbuminuria  - lisinopril (PRINIVIL,ZESTRIL) 5 MG tablet; Take 1 tablet (5 mg total) by mouth daily.  Dispense: 90 tablet; Refill: 1  4. Mucopurulent chronic bronchitis (HCC)  - umeclidinium-vilanterol (ANORO ELLIPTA) 62.5-25 MCG/INH AEPB; Inhale 1 puff into the lungs daily.  Dispense: 180 each; Refill: 1  5. Vitamin D deficiency  Take otc medication   6. Elevated C-reactive protein  - CBC with Differential/Platelet - CRP High sensitivity  7. RLS (restless legs syndrome)  Continue Requip  8. Neurogenic claudication  - gabapentin (NEURONTIN) 300 MG capsule; Take 1 capsule (300 mg total) by mouth 3 (three) times daily.  Dispense: 270 capsule; Refill: 1 - HYDROcodone-acetaminophen (NORCO/VICODIN) 5-325 MG tablet; Take 2 tablets by mouth 2 (two) times daily as needed for moderate pain.  Dispense: 30 tablet; Refill: 0  9. DM type 2 with diabetic peripheral neuropathy (HCC)  - sitaGLIPtin-metformin (JANUMET) 50-1000 MG tablet; Take 1 tablet by mouth 2 (two) times daily.  Dispense: 180 tablet; Refill: 1 - empagliflozin  (JARDIANCE) 25 MG TABS tablet; Take 25 mg by mouth daily.  Dispense: 90 tablet; Refill: 1  10. COPD with acute exacerbation (HCC)  Needs to resume medication   11. Chronic bilateral low back pain without sciatica  - gabapentin (NEURONTIN) 300 MG capsule; Take 1 capsule (300 mg total) by mouth 3 (three) times daily.  Dispense: 270 capsule; Refill: 1 - HYDROcodone-acetaminophen (NORCO/VICODIN) 5-325 MG tablet; Take 2 tablets by mouth 2 (two) times daily as needed for moderate pain.  Dispense: 30 tablet; Refill: 0  12. [place]-term use of high-risk medication  - COMPLETE METABOLIC PANEL WITH GFR

## 2017-03-19 LAB — COMPLETE METABOLIC PANEL WITH GFR
AG Ratio: 1.3 (calc) (ref 1.0–2.5)
ALBUMIN MSPROF: 4 g/dL (ref 3.6–5.1)
ALT: 9 U/L (ref 6–29)
AST: 11 U/L (ref 10–35)
Alkaline phosphatase (APISO): 85 U/L (ref 33–130)
BUN: 10 mg/dL (ref 7–25)
CO2: 30 mmol/L (ref 20–32)
CREATININE: 0.66 mg/dL (ref 0.50–0.99)
Calcium: 9.7 mg/dL (ref 8.6–10.4)
Chloride: 106 mmol/L (ref 98–110)
GFR, Est African American: 110 mL/min/{1.73_m2} (ref >=60)
GFR, Est Non African American: 95 mL/min/{1.73_m2} (ref >=60)
GLUCOSE: 108 mg/dL — AB (ref 65–99)
Globulin: 3.2 g/dL (calc) (ref 1.9–3.7)
Potassium: 4.3 mmol/L (ref 3.5–5.3)
Sodium: 142 mmol/L (ref 135–146)
TOTAL PROTEIN: 7.2 g/dL (ref 6.1–8.1)
Total Bilirubin: 0.3 mg/dL (ref 0.2–1.2)

## 2017-03-19 LAB — CBC WITH DIFFERENTIAL/PLATELET
BASOS ABS: 40 {cells}/uL (ref 0–200)
BASOS PCT: 0.5 %
EOS ABS: 232 {cells}/uL (ref 15–500)
EOS PCT: 2.9 %
HCT: 42.1 % (ref 35.0–45.0)
HEMOGLOBIN: 14.4 g/dL (ref 11.7–15.5)
LYMPHS ABS: 2376 {cells}/uL (ref 850–3900)
MCH: 31.6 pg (ref 27.0–33.0)
MCHC: 34.2 g/dL (ref 32.0–36.0)
MCV: 92.3 fL (ref 80.0–100.0)
MONOS PCT: 4.9 %
MPV: 11.7 fL (ref 7.5–12.5)
NEUTROS ABS: 4960 {cells}/uL (ref 1500–7800)
NEUTROS PCT: 62 %
Platelets: 300 10*3/uL (ref 140–400)
RBC: 4.56 10*6/uL (ref 3.80–5.10)
RDW: 13.4 % (ref 11.0–15.0)
Total Lymphocyte: 29.7 %
WBC mixed population: 392 cells/uL (ref 200–950)
WBC: 8 10*3/uL (ref 3.8–10.8)

## 2017-03-19 LAB — HEMOGLOBIN A1C
EAG (MMOL/L): 7.9 (calc)
HEMOGLOBIN A1C: 6.6 %{Hb} — AB (ref <5.7)
MEAN PLASMA GLUCOSE: 143 (calc)

## 2017-03-19 LAB — LIPID PANEL
CHOL/HDL RATIO: 2.1 (calc) (ref <5.0)
CHOLESTEROL: 105 mg/dL (ref <200)
HDL: 51 mg/dL (ref >50)
LDL CHOLESTEROL (CALC): 41 mg/dL
Non-HDL Cholesterol (Calc): 54 mg/dL (calc) (ref <130)
TRIGLYCERIDES: 52 mg/dL (ref <150)

## 2017-03-19 LAB — HIGH SENSITIVITY CRP: hs-CRP: 7.1 mg/L — ABNORMAL HIGH

## 2017-03-22 ENCOUNTER — Telehealth: Payer: Self-pay | Admitting: Family Medicine

## 2017-03-22 NOTE — Telephone Encounter (Signed)
Pt has been coughing since yesterday but had a little wheezing Friday. Runny nose (clear). No fever is present. Pt is requesting cough syrup to be called into [facility]. [phone]

## 2017-03-23 NOTE — Telephone Encounter (Signed)
Patient called. Patient is aware. 

## 2017-03-23 NOTE — Telephone Encounter (Signed)
I don't call in cough medication, she can try otc, or needs follow up

## 2017-04-18 ENCOUNTER — Ambulatory Visit (INDEPENDENT_AMBULATORY_CARE_PROVIDER_SITE_OTHER): Payer: Medicare Other | Admitting: Family Medicine

## 2017-04-18 ENCOUNTER — Encounter: Payer: Self-pay | Admitting: Family Medicine

## 2017-04-18 VITALS — BP 98/60 | HR 91 | Resp 14 | Ht <= 58 in | Wt 180.0 lb

## 2017-04-18 DIAGNOSIS — Z122 Encounter for screening for malignant neoplasm of respiratory organs: Secondary | ICD-10-CM

## 2017-04-18 DIAGNOSIS — R87610 Atypical squamous cells of undetermined significance on cytologic smear of cervix (ASC-US): Secondary | ICD-10-CM | POA: Diagnosis not present

## 2017-04-18 DIAGNOSIS — Z0000 Encounter for general adult medical examination without abnormal findings: Secondary | ICD-10-CM

## 2017-04-18 DIAGNOSIS — Z720 Tobacco use: Secondary | ICD-10-CM

## 2017-04-18 DIAGNOSIS — N369 Urethral disorder, unspecified: Secondary | ICD-10-CM

## 2017-04-18 DIAGNOSIS — Z1231 Encounter for screening mammogram for malignant neoplasm of breast: Secondary | ICD-10-CM

## 2017-04-18 DIAGNOSIS — N3941 Urge incontinence: Secondary | ICD-10-CM

## 2017-04-18 NOTE — Patient Instructions (Signed)
Preventive Care 40-64 Years, Female Preventive care refers to lifestyle choices and visits with your health care provider that can promote health and wellness. What does preventive care include?  A yearly physical exam. This is also called an annual well check.  Dental exams once or twice a year.  Routine eye exams. Ask your health care provider how often you should have your eyes checked.  Personal lifestyle choices, including: ? Daily care of your teeth and gums. ? Regular physical activity. ? Eating a healthy diet. ? Avoiding tobacco and drug use. ? Limiting alcohol use. ? Practicing safe sex. ? Taking low-dose aspirin daily starting at age 58. ? Taking vitamin and mineral supplements as recommended by your health care provider. What happens during an annual well check? The services and screenings done by your health care provider during your annual well check will depend on your age, overall health, lifestyle risk factors, and family history of disease. Counseling Your health care provider may ask you questions about your:  Alcohol use.  Tobacco use.  Drug use.  Emotional well-being.  Home and relationship well-being.  Sexual activity.  Eating habits.  Work and work Statistician.  Method of birth control.  Menstrual cycle.  Pregnancy history.  Screening You may have the following tests or measurements:  Height, weight, and BMI.  Blood pressure.  Lipid and cholesterol levels. These may be checked every 5 years, or more frequently if you are over 81 years old.  Skin check.  Lung cancer screening. You may have this screening every year starting at age 78 if you have a 30-pack-year history of smoking and currently smoke or have quit within the past 15 years.  Fecal occult blood test (FOBT) of the stool. You may have this test every year starting at age 65.  Flexible sigmoidoscopy or colonoscopy. You may have a sigmoidoscopy every 5 years or a colonoscopy  every 10 years starting at age 30.  Hepatitis C blood test.  Hepatitis B blood test.  Sexually transmitted disease (STD) testing.  Diabetes screening. This is done by checking your blood sugar (glucose) after you have not eaten for a while (fasting). You may have this done every 1-3 years.  Mammogram. This may be done every 1-2 years. Talk to your health care provider about when you should start having regular mammograms. This may depend on whether you have a family history of breast cancer.  BRCA-related cancer screening. This may be done if you have a family history of breast, ovarian, tubal, or peritoneal cancers.  Pelvic exam and Pap test. This may be done every 3 years starting at age 80. Starting at age 36, this may be done every 5 years if you have a Pap test in combination with an HPV test.  Bone density scan. This is done to screen for osteoporosis. You may have this scan if you are at high risk for osteoporosis.  Discuss your test results, treatment options, and if necessary, the need for more tests with your health care provider. Vaccines Your health care provider may recommend certain vaccines, such as:  Influenza vaccine. This is recommended every year.  Tetanus, diphtheria, and acellular pertussis (Tdap, Td) vaccine. You may need a Td booster every 10 years.  Varicella vaccine. You may need this if you have not been vaccinated.  Zoster vaccine. You may need this after age 5.  Measles, mumps, and rubella (MMR) vaccine. You may need at least one dose of MMR if you were born in  1958 or later. You may also need a second dose.  Pneumococcal 13-valent conjugate (PCV13) vaccine. You may need this if you have certain conditions and were not previously vaccinated.  Pneumococcal polysaccharide (PPSV23) vaccine. You may need one or two doses if you smoke cigarettes or if you have certain conditions.  Meningococcal vaccine. You may need this if you have certain  conditions.  Hepatitis A vaccine. You may need this if you have certain conditions or if you travel or work in places where you may be exposed to hepatitis A.  Hepatitis B vaccine. You may need this if you have certain conditions or if you travel or work in places where you may be exposed to hepatitis B.  Haemophilus influenzae type b (Hib) vaccine. You may need this if you have certain conditions.  Talk to your health care provider about which screenings and vaccines you need and how often you need them. This information is not intended to replace advice given to you by your health care provider. Make sure you discuss any questions you have with your health care provider. Document Released: 06/18/2015 Document Revised: 02/09/2016 Document Reviewed: 03/23/2015 Elsevier Interactive Patient Education  2017 [employer].

## 2017-04-18 NOTE — Progress Notes (Signed)
Patient: Julie Wall, Female    DOB: 02/12/1956, 61 y.o.   MRN: [id number]  Visit Date: 04/18/2017  Today's Provider: Ruel FavorsKrichna F Teryn Gust, MD   Chief Complaint  Patient presents with  . Annual Exam    medicare    Subjective:    HPI Julie CowboyLaverne Camacho is a 61 y.o. female who presents today for her Subsequent Annual Wellness Visit.  Patient/Caregiver input:     ASCUS: she lost to follow up on pap smear, in 2015 pap showed ASCUS, she is not sexually active, decline vaginal bleeding, no vaginal discharge. We will repeat pap today, discussed USPTF guidelines with her about it and importance of follow up  Incontinence: she states she has urinary urgency, no dysuria, denies stress incontinence symptoms. She denies nocturia. Discussed medication . Symptoms present for months, we will check urine culture and if negative for infection we will treat with medication   Review of Systems  Constitutional: Negative for fever or weight change.  Respiratory: Negative for cough and shortness of breath.   Cardiovascular: Negative for chest pain or palpitations.  Gastrointestinal: Negative for abdominal pain, no bowel changes.  Musculoskeletal: Negative for gait problem or joint swelling.  Skin: Negative for rash.  Neurological: Negative for dizziness or headache.  No other specific complaints in a complete review of systems (except as listed in HPI above).  Past Medical History:  Diagnosis Date  . Allergy   . ASCUS favor benign 09/2013   negative HPV  . Atopic dermatitis    [facility]  . Chronic low back pain   . Decreased exercise tolerance   . Dental caries   . Diabetes mellitus without complication (HCC)   . GERD (gastroesophageal reflux disease)   . Hypertension   . Lumbosacral neuritis   . Microalbuminuria   . Osteopenia   . Ovarian failure   . Tobacco use     Past Surgical History:  Procedure Laterality Date  . CATARACT EXTRACTION Bilateral 1980  . CESAREAN SECTION    .  CORNEAL TRANSPLANT Left    [facility]-Treatment for blindness.    Family History  Problem Relation Age of Onset  . Diabetes Mother   . Hypertension Mother   . Alcohol abuse Father   . Breast cancer Neg Hx     Social History   Socioeconomic History  . Marital status: Single    Spouse name: Not on file  . Number of children: 3  . Years of education: Not on file  . Highest education level: Not on file  Social Needs  . Financial resource strain: Somewhat hard  . Food insecurity - worry: Never true  . Food insecurity - inability: Sometimes true  . Transportation needs - medical: No  . Transportation needs - non-medical: No  Occupational History  . Not on file  Tobacco Use  . Smoking status: Current Every Day Smoker    Packs/day: 0.25    Years: 32.00    Pack years: 8.00    Types: Cigarettes    Start date: 01/07/1985  . Smokeless tobacco: Never Used  Substance and Sexual Activity  . Alcohol use: No    Alcohol/week: 0.0 oz  . Drug use: No  . Sexual activity: No  Other Topics Concern  . Not on file  Social History Narrative   Disabled from severe atopic dermatitis   Has 3 children   Lives alone but very connect with family ( sees sister and her children daily)   Also belongs to a  church    Outpatient Encounter Medications as of 04/18/2017  Medication Sig Note  . aspirin 81 MG chewable tablet Chew 1 tablet by mouth daily. 01/08/2015: Received from: [facility] Received Sig:   . atorvastatin (LIPITOR) 40 MG tablet Take 1 tablet (40 mg total) by mouth daily. In place of simvastatin   . empagliflozin (JARDIANCE) 25 MG TABS tablet Take 25 mg by mouth daily.   . folic acid (FOLVITE) 1 MG tablet Take by mouth. 06/14/2016: Received from: [facility] Received Sig: Take 1 mg by mouth once daily.  Marland Kitchen gabapentin (NEURONTIN) 300 MG capsule Take 1 capsule (300 mg total) by mouth 3 (three) times daily.   Marland Kitchen HYDROcodone-acetaminophen (NORCO/VICODIN) 5-325 MG  tablet Take 2 tablets by mouth 2 (two) times daily as needed for moderate pain.   Demetra Shiner Devices (SIMPLE DIAGNOSTICS LANCING DEV) MISC  06/14/2016: Received from: [facility]  . lisinopril (PRINIVIL,ZESTRIL) 5 MG tablet Take 1 tablet (5 mg total) by mouth daily.   . methotrexate (RHEUMATREX) 2.5 MG tablet Take 15 mg by mouth once a week.  08/12/2015: Received from: [facility]  . rOPINIRole (REQUIP) 2 MG tablet Take 1 tablet (2 mg total) by mouth daily.   Marland Kitchen triamcinolone cream (KENALOG) 0.1 % TRIAMCINOLONE ACETONIDE, 0.1% (External Cream)  use per dermatologist. for 0 days  Quantity: 1.00;  Refills: 0   Ordered :15-Jun-2010  Alba Cory MD;  Started 02-Apr-2008 Active Comments: DX: 724.2 01/08/2015: DX: 724.2 Received from: [facility]  . umeclidinium-vilanterol (ANORO ELLIPTA) 62.5-25 MCG/INH AEPB Inhale 1 puff into the lungs daily.   . [DISCONTINUED] sitaGLIPtin-metformin (JANUMET) 50-1000 MG tablet Take 1 tablet by mouth 2 (two) times daily. (Patient not taking: Reported on 04/18/2017)    No facility-administered encounter medications on file as of 04/18/2017.     Allergies  Allergen Reactions  . Penicillins Rash    Care Team Updated in EHR: Yes  Last Vision Exam:  Wears corrective lenses: Yes she is due for an eye exam now Last Dental Exam: does not go - does not like dentist Last Hearing Exam: never Wears Hearing Aids: No, no problems hearing  Functional Ability / Safety Screening 1.  Was the timed Get Up and Go test shorter than 30 seconds?  yes 2.  Does the patient need help with the phone, transportation, shopping,      preparing meals, housework, laundry, medications, or managing money?  no 3.  Is the patient's home free of loose throw rugs in walkways, pet beds, electrical cords, etc?   yes      Grab bars in the bathroom? no      Handrails on the stairs?   yes      Adequate lighting?   yes 4.  Has the patient noticed any hearing difficulties?    no  Diet Recall and Exercise Regimen: Current Exercise Habits: The patient does not participate in regular exercise at present Exercise limited by: None identified She states she tries to eat healthy  Advanced Care Planning: A voluntary discussion about advance care planning including the explanation and discussion of advance directives.  Discussed health care proxy and Living will, and the patient was able to identify a health care proxy as her children Patient does not  have a living will at present time. If patient does have living will, I have requested they bring this to the clinic to be scanned in to their chart. Does patient have a HCPOA?  no If yes, name and contact information:  Does patient have a living will or MOST form?  no   Cancer Screenings:  Lung:  Low Dose CT Chest recommended if Age 37-80 years, 30 pack-year currently smoking OR have quit w/in 15years. Patient does qualify. Breast:  Up to date on Mammogram? Yes  Up to date of Bone Density/Dexa? Not due until age 68 Colon: up to date    Objective:   Vitals: BP 98/60 (BP Location: Right Arm, Patient Position: Sitting, Cuff Size: Normal)   Pulse 91   Resp 14   Ht 4\' 10"  (1.473 m)   Wt 180 lb (81.6 kg)   SpO2 98%   BMI 37.62 kg/m  Body mass index is 37.62 kg/m.  No exam data present  Physical Exam Constitutional: Patient appears well-developed and well-nourished. Obese  No distress.  HEENT: head atraumatic, normocephalic, pupils equal and reactive to light,neck supple, throat within normal limits Cardiovascular: Normal rate, regular rhythm and normal heart sounds.  No murmur heard. No BLE edema. Pulmonary/Chest: Effort normal and breath sounds normal. No respiratory distress. Abdominal: Soft.  There is no tenderness. Breast:normal  GYN: urethra has a white growth ( wart like ), discussed finding and referral to Urologist , normal bimanual exam , cervix normal  Skin: hypopigmentation  Psychiatric: Patient  has a normal mood and affect. behavior is normal. Judgment and thought content normal.  Cognitive Testing - 6-CIT  Correct? Score   What year is it? yes 0 Yes = 0    No = 4  What month is it? yes 0 Yes = 0    No = 3  Remember:     Floyde Parkins, [address], [place]     What time is it? yes 0 Yes = 0    No = 3  Count backwards from 20 to 1 yes 0 Correct = 0    1 error = 2   More than 1 error = 4  Say the months of the year in reverse. yes 0 Correct = 0    1 error = 2   More than 1 error = 4  What address did I ask you to remember? no 4 Correct = 0  1 error = 2    2 error = 4    3 error = 6    4 error = 8    All wrong = 10       TOTAL SCORE  4/28   Interpretation:  Normal  Normal (0-7) Abnormal (8-28)   Fall Risk: Fall Risk  04/18/2017 03/16/2017 12/05/2016 09/13/2016 06/14/2016  Falls in the past year? No No Yes Yes No  Number falls in past yr: - - 2 or more 2 or more -  Injury with Fall? - - No No -  Risk Factor Category  - - High Fall Risk High Fall Risk -  Follow up - - - Falls evaluation completed -    Depression Screen Depression screen [facility] 2/9 04/18/2017 03/16/2017 12/05/2016 09/13/2016 06/14/2016  Decreased Interest 0 0 0 0 0  Down, Depressed, Hopeless 0 0 0 0 0  PHQ - 2 Score 0 0 0 0 0    Recent Results (from the past 2160 hour(s))  CBC with Differential/Platelet     Status: None   Collection Time: 03/16/17 12:29 PM  Result Value Ref Range   WBC 8.0 3.8 - 10.8 Thousand/uL   RBC 4.56 3.80 - 5.10 Million/uL   Hemoglobin  14.4 11.7 - 15.5 g/dL   HCT 29.5 62.1 - 30.8 %   MCV 92.3 80.0 - 100.0 fL   MCH 31.6 27.0 - 33.0 pg   MCHC 34.2 32.0 - 36.0 g/dL   RDW 65.7 84.6 - 96.2 %   Platelets 300 140 - 400 Thousand/uL   MPV 11.7 7.5 - 12.5 fL   Neutro Abs 4,960 1,500 - 7,800 cells/uL   Lymphs Abs 2,376 850 - 3,900 cells/uL   WBC mixed population 392 200 - 950 cells/uL   Eosinophils Absolute 232 15 - 500 cells/uL   Basophils Absolute 40 0 - 200 cells/uL   Neutrophils Relative %  62 %   Total Lymphocyte 29.7 %   Monocytes Relative 4.9 %   Eosinophils Relative 2.9 %   Basophils Relative 0.5 %  COMPLETE METABOLIC PANEL WITH GFR     Status: Abnormal   Collection Time: 03/16/17 12:29 PM  Result Value Ref Range   Glucose, Bld 108 (H) 65 - 99 mg/dL    Comment: .            Fasting reference interval . For someone without known diabetes, a glucose value between 100 and 125 mg/dL is consistent with prediabetes and should be confirmed with a follow-up test. .    BUN 10 7 - 25 mg/dL   Creat 9.52 8.41 - 3.24 mg/dL    Comment: For patients >33 years of age, the reference limit for Creatinine is approximately 13% higher for people identified as African-American. .    GFR, Est Non African American 95 > OR = 60 mL/min/1.53m2   GFR, Est African American 110 > OR = 60 mL/min/1.55m2   BUN/Creatinine Ratio NOT APPLICABLE 6 - 22 (calc)   Sodium 142 135 - 146 mmol/L   Potassium 4.3 3.5 - 5.3 mmol/L   Chloride 106 98 - 110 mmol/L   CO2 30 20 - 32 mmol/L   Calcium 9.7 8.6 - 10.4 mg/dL   Total Protein 7.2 6.1 - 8.1 g/dL   Albumin 4.0 3.6 - 5.1 g/dL   Globulin 3.2 1.9 - 3.7 g/dL (calc)   AG Ratio 1.3 1.0 - 2.5 (calc)   Total Bilirubin 0.3 0.2 - 1.2 mg/dL   Alkaline phosphatase (APISO) 85 33 - 130 U/L   AST 11 10 - 35 U/L   ALT 9 6 - 29 U/L  Lipid panel     Status: None   Collection Time: 03/16/17 12:29 PM  Result Value Ref Range   Cholesterol 105 <200 mg/dL   HDL 51 >40 mg/dL   Triglycerides 52 <102 mg/dL   LDL Cholesterol (Calc) 41 mg/dL (calc)    Comment: Reference range: <100 . Desirable range <100 mg/dL for primary prevention;   <70 mg/dL for patients with CHD or diabetic patients  with > or = 2 CHD risk factors. Marland Kitchen LDL-C is now calculated using the Martin-Hopkins  calculation, which is a validated novel method providing  better accuracy than the Friedewald equation in the  estimation of LDL-C.  Horald Pollen et al. Lenox Ahr. 7253;664(40): 2061-2068   (http://education.[facility].com/faq/FAQ164)    Total CHOL/HDL Ratio 2.1 <5.0 (calc)   Non-HDL Cholesterol (Calc) 54 <347 mg/dL (calc)    Comment: For patients with diabetes plus 1 major ASCVD risk  factor, treating to a non-HDL-C goal of <100 mg/dL  (LDL-C of <42 mg/dL) is considered a therapeutic  option.   Hemoglobin A1c     Status: Abnormal   Collection Time: 03/16/17 12:29 PM  Result Value Ref Range   Hgb A1c MFr Bld 6.6 (H) <5.7 % of total Hgb    Comment: For someone without known diabetes, a hemoglobin A1c value of 6.5% or greater indicates that they may have  diabetes and this should be confirmed with a follow-up  test. . For someone with known diabetes, a value <7% indicates  that their diabetes is well controlled and a value  greater than or equal to 7% indicates suboptimal  control. A1c targets should be individualized based on  duration of diabetes, age, comorbid conditions, and  other considerations. . Currently, no consensus exists regarding use of hemoglobin A1c for diagnosis of diabetes for children. .    Mean Plasma Glucose 143 (calc)   eAG (mmol/L) 7.9 (calc)  CRP High sensitivity     Status: Abnormal   Collection Time: 03/16/17 12:29 PM  Result Value Ref Range   hs-CRP 7.1 (H) mg/L    Comment: . Higher relative cardiovascular risk according to AHA/CDC guidelines. Consider retesting in 1 to 2 weeks to exclude a benign transient elevation in the baseline CRP value secondary to infection or inflammation. . . For ages >57 Years: hs-CRP mg/L  Risk According to AHA/CDC Guidelines <1.0         Lower relative cardiovascular risk. 1.0-3.0      Average relative cardiovascular risk. 3.1-10.0     Higher relative cardiovascular risk.              Consider retesting in 1 to 2 weeks to              exclude a benign transient elevation              in the baseline CRP value secondary              to infection or inflammation. >10.0        Persistent  elevation, upon retesting,              may be associated with infection and              inflammation. .     Assessment & Plan:   1. Medicare annual wellness visit, subsequent  Discussed importance of 150 minutes of physical activity weekly, eat two servings of fish weekly, eat one serving of tree nuts ( cashews, pistachios, pecans, almonds.Marland Kitchen) every other day, eat 6 servings of fruit/vegetables daily and drink plenty of water and avoid sweet beverages.   2. Encounter for screening for lung cancer   3. Encounter for screening mammogram for breast cancer   4. Tobacco use  - CT CHEST LUNG CA SCREEN LOW DOSE W/O CM; Future Discussed importance of quitting smoking  5. ASCUS of cervix with negative high risk HPV  - Pap IG and HPV (high risk) DNA detection  6. Urge incontinence of urine  - Urine Culture  Immunization History  Administered Date(s) Administered  . Influenza Split 04/09/2012  . Influenza, Seasonal, Injecte, Preservative Fre 02/17/2011  . Influenza,inj,Quad PF,6+ Mos 07/16/2013, 03/23/2014, 04/01/2015, 03/14/2016, 03/16/2017  . Influenza-Unspecified 03/23/2014  . Pneumococcal Conjugate-13 04/01/2015  . Pneumococcal Polysaccharide-23 01/26/2010  . Tdap 01/25/2012  . Zoster 01/08/2015    Health Maintenance  Topic Date Due  . PAP SMEAR  10/02/2016  . OPHTHALMOLOGY EXAM  11/24/2016  . FOOT EXAM  09/13/2017  . HEMOGLOBIN A1C  09/14/2017  . MAMMOGRAM  12/22/2017  . COLONOSCOPY  07/05/2020  . TETANUS/TDAP  01/24/2022  . PNEUMOCOCCAL POLYSACCHARIDE VACCINE  Completed  . INFLUENZA VACCINE  Completed  . Hepatitis C Screening  Completed  . HIV Screening  Completed    No orders of the defined types were placed in this encounter.   Current Outpatient Medications:  .  aspirin 81 MG chewable tablet, Chew 1 tablet by mouth daily., Disp: , Rfl:  .  atorvastatin (LIPITOR) 40 MG tablet, Take 1 tablet (40 mg total) by mouth daily. In place of simvastatin, Disp: 90  tablet, Rfl: 1 .  empagliflozin (JARDIANCE) 25 MG TABS tablet, Take 25 mg by mouth daily., Disp: 90 tablet, Rfl: 1 .  folic acid (FOLVITE) 1 MG tablet, Take by mouth., Disp: , Rfl:  .  gabapentin (NEURONTIN) 300 MG capsule, Take 1 capsule (300 mg total) by mouth 3 (three) times daily., Disp: 270 capsule, Rfl: 1 .  HYDROcodone-acetaminophen (NORCO/VICODIN) 5-325 MG tablet, Take 2 tablets by mouth 2 (two) times daily as needed for moderate pain., Disp: 30 tablet, Rfl: 0 .  Lancet Devices (SIMPLE DIAGNOSTICS LANCING DEV) MISC, , Disp: , Rfl:  .  lisinopril (PRINIVIL,ZESTRIL) 5 MG tablet, Take 1 tablet (5 mg total) by mouth daily., Disp: 90 tablet, Rfl: 1 .  methotrexate (RHEUMATREX) 2.5 MG tablet, Take 15 mg by mouth once a week. , Disp: , Rfl:  .  rOPINIRole (REQUIP) 2 MG tablet, Take 1 tablet (2 mg total) by mouth daily., Disp: 30 tablet, Rfl: 0 .  triamcinolone cream (KENALOG) 0.1 %, TRIAMCINOLONE ACETONIDE, 0.1% (External Cream)  use per dermatologist. for 0 days  Quantity: 1.00;  Refills: 0   Ordered :15-Jun-2010  Alba CorySOWLES, Yurem Viner MD;  Started 02-Apr-2008 Active Comments: DX: 724.2, Disp: , Rfl:  .  umeclidinium-vilanterol (ANORO ELLIPTA) 62.5-25 MCG/INH AEPB, Inhale 1 puff into the lungs daily., Disp: 180 each, Rfl: 1 Medications Discontinued During This Encounter  Medication Reason  . sitaGLIPtin-metformin (JANUMET) 50-1000 MG tablet Change in therapy    I have personally reviewed and addressed the Medicare Annual Wellness health risk assessment questionnaire and have noted the following in the patient's chart:  A.         Medical and social history & family history B.         Use of alcohol, tobacco, and illicit drugs  C.         Current medications and supplements D.         Functional and Cognitive ability and status E.         Nutritional status F.         Physical activity G.        Advance directives H.         List of other physicians I.          Hospitalizations, surgeries, and ER  visits in previous 12 months J.         Vitals K.         Screenings such as hearing, vision, cognitive function, and depression L.         Referrals and appointments: discussed CT chest scan  In addition, I have reviewed and discussed with patient certain preventive protocols, quality metrics, and best practice recommendations. A written personalized care plan for preventive services as well as general preventive health recommendations were provided to patient.  See attached scanned questionnaire for additional information.

## 2017-04-19 ENCOUNTER — Telehealth: Payer: Self-pay | Admitting: *Deleted

## 2017-04-19 DIAGNOSIS — Z122 Encounter for screening for malignant neoplasm of respiratory organs: Secondary | ICD-10-CM

## 2017-04-19 DIAGNOSIS — Z87891 Personal history of nicotine dependence: Secondary | ICD-10-CM

## 2017-04-19 NOTE — Telephone Encounter (Signed)
Received referral for initial lung cancer screening scan. Contacted patient and obtained smoking history,(current 34.5 pack year) as well as answering questions related to screening process. Patient denies signs of lung cancer such as weight loss or hemoptysis. Patient denies comorbidity that would prevent curative treatment if lung cancer were found. Patient is scheduled for shared decision making visit and CT scan on 05/01/17.

## 2017-04-23 ENCOUNTER — Encounter: Payer: Self-pay | Admitting: Family Medicine

## 2017-04-23 ENCOUNTER — Other Ambulatory Visit: Payer: Self-pay | Admitting: Family Medicine

## 2017-04-23 DIAGNOSIS — R87610 Atypical squamous cells of undetermined significance on cytologic smear of cervix (ASC-US): Secondary | ICD-10-CM | POA: Insufficient documentation

## 2017-04-23 LAB — URINE CULTURE
MICRO NUMBER: 81285058
SPECIMEN QUALITY: ADEQUATE

## 2017-04-23 LAB — PAP IG AND HPV HIGH-RISK: HPV DNA High Risk: NOT DETECTED

## 2017-04-23 MED ORDER — CIPROFLOXACIN HCL 250 MG PO TABS: 250.0000 mg | ORAL_TABLET | Freq: Two times a day (BID) | ORAL | 0 refills | Status: DC

## 2017-05-01 ENCOUNTER — Ambulatory Visit
Admission: RE | Admit: 2017-05-01 | Discharge: 2017-05-01 | Disposition: A | Discharge status: Home / Self Care | Payer: Medicare Other | Source: Ambulatory Visit | Attending: Nurse Practitioner | Admitting: Nurse Practitioner

## 2017-05-01 ENCOUNTER — Inpatient Hospital Stay: Payer: Medicare Other | Attending: Nurse Practitioner | Admitting: Nurse Practitioner

## 2017-05-01 ENCOUNTER — Encounter (INDEPENDENT_AMBULATORY_CARE_PROVIDER_SITE_OTHER): Payer: Self-pay

## 2017-05-01 ENCOUNTER — Encounter: Payer: Self-pay | Admitting: Nurse Practitioner

## 2017-05-01 DIAGNOSIS — Z87891 Personal history of nicotine dependence: Secondary | ICD-10-CM

## 2017-05-01 DIAGNOSIS — Z122 Encounter for screening for malignant neoplasm of respiratory organs: Secondary | ICD-10-CM | POA: Insufficient documentation

## 2017-05-01 DIAGNOSIS — I700 Atherosclerosis of aorta: Secondary | ICD-10-CM | POA: Diagnosis not present

## 2017-05-01 DIAGNOSIS — F17210 Nicotine dependence, cigarettes, uncomplicated: Secondary | ICD-10-CM

## 2017-05-01 NOTE — Progress Notes (Signed)
In accordance with CMS guidelines, patient has met eligibility criteria including age, absence of signs or symptoms of lung cancer.  Social History   Tobacco Use  . Smoking status: Current Every Day Smoker    Packs/day: 0.75    Years: 46.00    Pack years: 34.50    Types: Cigarettes    Start date: 01/07/1985  . Smokeless tobacco: Never Used  Substance Use Topics  . Alcohol use: No    Alcohol/week: 0.0 oz  . Drug use: No     A shared decision-making session was conducted prior to the performance of CT scan. This includes one or more decision aids, includes benefits and harms of screening, follow-up diagnostic testing, over-diagnosis, false positive rate, and total radiation exposure.  Counseling on the importance of adherence to annual lung cancer LDCT screening, impact of co-morbidities, and ability or willingness to undergo diagnosis and treatment is imperative for compliance of the program.  Counseling on the importance of continued smoking cessation for former smokers; the importance of smoking cessation for current smokers, and information about tobacco cessation interventions have been given to patient including [place] and 1800 quit [place] programs.  Written order for lung cancer screening with LDCT has been given to the patient and any and all questions have been answered to the best of my abilities.   Yearly follow up will be coordinated by Burgess Estelle, Thoracic Navigator.  Beckey Rutter, DNP, AGNP-C 05/01/17 4:15 PM

## 2017-05-04 ENCOUNTER — Encounter: Payer: Self-pay | Admitting: *Deleted

## 2017-05-04 ENCOUNTER — Encounter: Payer: Self-pay | Admitting: Family Medicine

## 2017-05-04 DIAGNOSIS — I700 Atherosclerosis of aorta: Secondary | ICD-10-CM | POA: Insufficient documentation

## 2017-05-23 ENCOUNTER — Telehealth: Payer: Self-pay

## 2017-05-23 NOTE — Telephone Encounter (Signed)
Can you see if patient is able to be seen? Thanks

## 2017-05-23 NOTE — Telephone Encounter (Signed)
Needs to be seen

## 2017-05-23 NOTE — Telephone Encounter (Signed)
Copied from CRM [id number]. Topic: General - Other >> May 23, 2017 12:21 PM Percival SpanishKennedy, Cheryl W wrote:  Pt has had a cough for about a week and is asking if a cough med can be called in for her   [facility]

## 2017-05-24 ENCOUNTER — Encounter: Payer: Self-pay | Admitting: Family Medicine

## 2017-05-24 ENCOUNTER — Ambulatory Visit (INDEPENDENT_AMBULATORY_CARE_PROVIDER_SITE_OTHER): Payer: Medicare Other | Admitting: Family Medicine

## 2017-05-24 ENCOUNTER — Telehealth: Payer: Self-pay | Admitting: Family Medicine

## 2017-05-24 VITALS — BP 102/58 | HR 95 | Temp 98.2°F | Resp 18 | Ht 58.27 in | Wt 180.8 lb

## 2017-05-24 DIAGNOSIS — E1129 Type 2 diabetes mellitus with other diabetic kidney complication: Secondary | ICD-10-CM

## 2017-05-24 DIAGNOSIS — J441 Chronic obstructive pulmonary disease with (acute) exacerbation: Secondary | ICD-10-CM

## 2017-05-24 MED ORDER — LEVOFLOXACIN 500 MG PO TABS: 500.0000 mg | ORAL_TABLET | Freq: Every day | ORAL | 0 refills | Status: DC

## 2017-05-24 MED ORDER — HYDROCOD POLST-CPM POLST ER 10-8 MG/5ML PO SUER: 5.0000 mL | Freq: Every evening | ORAL | 0 refills | Status: DC | PRN

## 2017-05-24 MED ORDER — PREDNISONE 10 MG PO TABS: 10.0000 mg | ORAL_TABLET | Freq: Every day | ORAL | 0 refills | Status: DC

## 2017-05-24 NOTE — Progress Notes (Signed)
Name: Julie Wall   MRN: [id number]    DOB: 08/13/1955   Date:05/24/2017       Progress Note  Subjective  Chief Complaint  Chief Complaint  Patient presents with  . Cough    Onset-1 week, unable to get up phlegm- coughs so much her chest feel tight.     HPI  COPD: symptoms started as a cough, productive, continuous, color is green to yellowish in color, wheezing, no SOB. She has chronic bronchitis but has been out of [place] for about one month and symptoms started one week ago. She states she is coughing so much that her rib cage is sore and also stomach muscles. No fever or chills. Normal appetite. FSBS has been well controlled low 100's No polyphagia, polydipsia but has some urinary frequency and incontinence from severe cough   Patient Active Problem List   Diagnosis Date Noted  . Atherosclerosis of aorta (HCC) 05/04/2017  . Pap smear abnormality of cervix with ASCUS favoring benign 04/23/2017  . Vitamin D deficiency 12/14/2016  . Obesity (BMI 30.0-34.9) 12/13/2015  . Chronic bronchitis (HCC) 05/14/2015  . DM type 2 with diabetic peripheral neuropathy (HCC) 05/14/2015  . Positive H. pylori test 04/05/2015  . Rapid urease test for Helicobacter pylori infection positive 04/05/2015  . Umbilical hernia 04/01/2015  . Exomphalos 04/01/2015  . RLS (restless legs syndrome) 01/08/2015  . Type 2 diabetes mellitus with other diabetic kidney complication (HCC) 01/08/2015  . Neurogenic claudication 01/08/2015  . AD (atopic dermatitis) 12/28/2014  . Chronic LBP 12/28/2014  . Dyslipidemia 12/28/2014  . Gastroesophageal reflux disease without esophagitis 12/28/2014  . Microalbuminuria 12/28/2014  . Disorder of bone and cartilage 12/28/2014  . Neuralgia of left thigh 12/28/2014  . Perennial allergic rhinitis with seasonal variation 12/28/2014  . Tobacco abuse 12/28/2014  . Hypothyroidism 09/22/2005  . Hypertension, benign 09/22/2005    Past Surgical History:  Procedure Laterality Date   . CATARACT EXTRACTION Bilateral 1980  . CESAREAN SECTION    . CORNEAL TRANSPLANT Left    [facility]-Treatment for blindness.    Family History  Problem Relation Age of Onset  . Diabetes Mother   . Hypertension Mother   . Alcohol abuse Father   . Breast cancer Neg Hx     Social History   Socioeconomic History  . Marital status: Single    Spouse name: Not on file  . Number of children: 3  . Years of education: Not on file  . Highest education level: Not on file  Social Needs  . Financial resource strain: Somewhat hard  . Food insecurity - worry: Never true  . Food insecurity - inability: Sometimes true  . Transportation needs - medical: No  . Transportation needs - non-medical: No  Occupational History  . Not on file  Tobacco Use  . Smoking status: Current Every Day Smoker    Packs/day: 0.75    Years: 46.00    Pack years: 34.50    Types: Cigarettes    Start date: 01/07/1985  . Smokeless tobacco: Never Used  Substance and Sexual Activity  . Alcohol use: No    Alcohol/week: 0.0 oz  . Drug use: No  . Sexual activity: No  Other Topics Concern  . Not on file  Social History Narrative   Disabled from severe atopic dermatitis   Has 3 children   Lives alone but very connect with family ( sees sister and her children daily)   Also belongs to a church  Current Outpatient Medications:  .  aspirin 81 MG chewable tablet, Chew 1 tablet by mouth daily., Disp: , Rfl:  .  atorvastatin (LIPITOR) 40 MG tablet, Take 1 tablet (40 mg total) by mouth daily. In place of simvastatin, Disp: 90 tablet, Rfl: 1 .  empagliflozin (JARDIANCE) 25 MG TABS tablet, Take 25 mg by mouth daily., Disp: 90 tablet, Rfl: 1 .  folic acid (FOLVITE) 1 MG tablet, Take by mouth., Disp: , Rfl:  .  gabapentin (NEURONTIN) 300 MG capsule, Take 1 capsule (300 mg total) by mouth 3 (three) times daily., Disp: 270 capsule, Rfl: 1 .  HYDROcodone-acetaminophen (NORCO/VICODIN) 5-325 MG tablet, Take 2 tablets by mouth 2  (two) times daily as needed for moderate pain., Disp: 30 tablet, Rfl: 0 .  Lancet Devices (SIMPLE DIAGNOSTICS LANCING DEV) MISC, , Disp: , Rfl:  .  lisinopril (PRINIVIL,ZESTRIL) 5 MG tablet, Take 1 tablet (5 mg total) by mouth daily., Disp: 90 tablet, Rfl: 1 .  methotrexate (RHEUMATREX) 2.5 MG tablet, Take 15 mg by mouth once a week. , Disp: , Rfl:  .  rOPINIRole (REQUIP) 2 MG tablet, Take 1 tablet (2 mg total) by mouth daily., Disp: 30 tablet, Rfl: 0 .  triamcinolone cream (KENALOG) 0.1 %, TRIAMCINOLONE ACETONIDE, 0.1% (External Cream)  use per dermatologist. for 0 days  Quantity: 1.00;  Refills: 0   Ordered :15-Jun-2010  Alba Cory MD;  Started 02-Apr-2008 Active Comments: DX: 724.2, Disp: , Rfl:  .  umeclidinium-vilanterol (ANORO ELLIPTA) 62.5-25 MCG/INH AEPB, Inhale 1 puff into the lungs daily., Disp: 180 each, Rfl: 1 .  levofloxacin (LEVAQUIN) 500 MG tablet, Take 1 tablet (500 mg total) by mouth daily., Disp: 7 tablet, Rfl: 0 .  predniSONE (DELTASONE) 10 MG tablet, Take 1 tablet (10 mg total) by mouth daily with breakfast., Disp: 10 tablet, Rfl: 0  Allergies  Allergen Reactions  . Penicillins Rash     ROS  Ten systems reviewed and is negative except as mentioned in HPI   Objective  Vitals:   05/24/17 1157  BP: (!) 102/58  Pulse: 95  Resp: 18  Temp: 98.2 F (36.8 C)  TempSrc: Oral  SpO2: 96%  Weight: 180 lb 12.8 oz (82 kg)  Height: 4' 10.27" (1.48 m)    Body mass index is 37.44 kg/m.  Physical Exam  Constitutional: Patient appears well-developed and well-nourished. Obese No distress.  HEENT: head atraumatic, normocephalic, pupils equal and reactive to light,  neck supple, throat within normal limits Cardiovascular: Normal rate, regular rhythm and normal heart sounds.  No murmur heard. No BLE edema. Pulmonary/Chest: Effort normal , she has rhonchi, wheezing and some rales on right mid lung area. No respiratory distress. Abdominal: Soft.  There is no  tenderness. Psychiatric: Patient has a normal mood and affect. behavior is normal. Judgment and thought content normal.  Recent Results (from the past 2160 hour(s))  CBC with Differential/Platelet     Status: None   Collection Time: 03/16/17 12:29 PM  Result Value Ref Range   WBC 8.0 3.8 - 10.8 Thousand/uL   RBC 4.56 3.80 - 5.10 Million/uL   Hemoglobin 14.4 11.7 - 15.5 g/dL   HCT 16.1 09.6 - 04.5 %   MCV 92.3 80.0 - 100.0 fL   MCH 31.6 27.0 - 33.0 pg   MCHC 34.2 32.0 - 36.0 g/dL   RDW 40.9 81.1 - 91.4 %   Platelets 300 140 - 400 Thousand/uL   MPV 11.7 7.5 - 12.5 fL   Neutro Abs 4,960  1,500 - 7,800 cells/uL   Lymphs Abs 2,376 850 - 3,900 cells/uL   WBC mixed population 392 200 - 950 cells/uL   Eosinophils Absolute 232 15 - 500 cells/uL   Basophils Absolute 40 0 - 200 cells/uL   Neutrophils Relative % 62 %   Total Lymphocyte 29.7 %   Monocytes Relative 4.9 %   Eosinophils Relative 2.9 %   Basophils Relative 0.5 %  COMPLETE METABOLIC PANEL WITH GFR     Status: Abnormal   Collection Time: 03/16/17 12:29 PM  Result Value Ref Range   Glucose, Bld 108 (H) 65 - 99 mg/dL    Comment: .            Fasting reference interval . For someone without known diabetes, a glucose value between 100 and 125 mg/dL is consistent with prediabetes and should be confirmed with a follow-up test. .    BUN 10 7 - 25 mg/dL   Creat 4.09 8.11 - 9.14 mg/dL    Comment: For patients >24 years of age, the reference limit for Creatinine is approximately 13% higher for people identified as African-American. .    GFR, Est Non African American 95 > OR = 60 mL/min/1.40m2   GFR, Est African American 110 > OR = 60 mL/min/1.47m2   BUN/Creatinine Ratio NOT APPLICABLE 6 - 22 (calc)   Sodium 142 135 - 146 mmol/L   Potassium 4.3 3.5 - 5.3 mmol/L   Chloride 106 98 - 110 mmol/L   CO2 30 20 - 32 mmol/L   Calcium 9.7 8.6 - 10.4 mg/dL   Total Protein 7.2 6.1 - 8.1 g/dL   Albumin 4.0 3.6 - 5.1 g/dL   Globulin 3.2  1.9 - 3.7 g/dL (calc)   AG Ratio 1.3 1.0 - 2.5 (calc)   Total Bilirubin 0.3 0.2 - 1.2 mg/dL   Alkaline phosphatase (APISO) 85 33 - 130 U/L   AST 11 10 - 35 U/L   ALT 9 6 - 29 U/L  Lipid panel     Status: None   Collection Time: 03/16/17 12:29 PM  Result Value Ref Range   Cholesterol 105 <200 mg/dL   HDL 51 >78 mg/dL   Triglycerides 52 <295 mg/dL   LDL Cholesterol (Calc) 41 mg/dL (calc)    Comment: Reference range: <100 . Desirable range <100 mg/dL for primary prevention;   <70 mg/dL for patients with CHD or diabetic patients  with > or = 2 CHD risk factors. Marland Kitchen LDL-C is now calculated using the Martin-Hopkins  calculation, which is a validated novel method providing  better accuracy than the Friedewald equation in the  estimation of LDL-C.  Horald Pollen et al. Lenox Ahr. 6213;086(57): 2061-2068  (http://education.[facility].com/faq/FAQ164)    Total CHOL/HDL Ratio 2.1 <5.0 (calc)   Non-HDL Cholesterol (Calc) 54 <846 mg/dL (calc)    Comment: For patients with diabetes plus 1 major ASCVD risk  factor, treating to a non-HDL-C goal of <100 mg/dL  (LDL-C of <96 mg/dL) is considered a therapeutic  option.   Hemoglobin A1c     Status: Abnormal   Collection Time: 03/16/17 12:29 PM  Result Value Ref Range   Hgb A1c MFr Bld 6.6 (H) <5.7 % of total Hgb    Comment: For someone without known diabetes, a hemoglobin A1c value of 6.5% or greater indicates that they may have  diabetes and this should be confirmed with a follow-up  test. . For someone with known diabetes, a value <7% indicates  that their diabetes is well  controlled and a value  greater than or equal to 7% indicates suboptimal  control. A1c targets should be individualized based on  duration of diabetes, age, comorbid conditions, and  other considerations. . Currently, no consensus exists regarding use of hemoglobin A1c for diagnosis of diabetes for children. .    Mean Plasma Glucose 143 (calc)   eAG (mmol/L) 7.9  (calc)  CRP High sensitivity     Status: Abnormal   Collection Time: 03/16/17 12:29 PM  Result Value Ref Range   hs-CRP 7.1 (H) mg/L    Comment: . Higher relative cardiovascular risk according to AHA/CDC guidelines. Consider retesting in 1 to 2 weeks to exclude a benign transient elevation in the baseline CRP value secondary to infection or inflammation. . . For ages >38 Years: hs-CRP mg/L  Risk According to AHA/CDC Guidelines <1.0         Lower relative cardiovascular risk. 1.0-3.0      Average relative cardiovascular risk. 3.1-10.0     Higher relative cardiovascular risk.              Consider retesting in 1 to 2 weeks to              exclude a benign transient elevation              in the baseline CRP value secondary              to infection or inflammation. >10.0        Persistent elevation, upon retesting,              may be associated with infection and              inflammation. .   Pap IG and HPV (high risk) DNA detection     Status: Abnormal   Collection Time: 04/18/17  4:36 PM  Result Value Ref Range   Clinical Information:      Comment: None given   LMP:      Comment: NA   PREV. PAP:      Comment: NA   PREV. BX:      Comment: NA   HPV DNA Probe-Source      Comment: Endocervix   STATEMENT OF ADEQUACY:      Comment: SATISFACTORY FOR EVALUATION   GENERAL CATEGORIZATION: (A)     Comment: EPITHELIAL CELL ABNORMALITY   INTERPRETATION/RESULT: (A)     Comment: Atypical Squamous Cells of Undetermined Significance (ASC-US)    Comment:      Comment: This Pap test has been evaluated with computer assisted technology. Suggest clinical correlation and follow-up as clinically appropriate    CYTOTECHNOLOGIST:      Comment: EAW, CT(ASCP) CT screening location: [address], [place], [place] [address]    PATHOLOGIST:      Comment: Margaretha Glassing. Fields, MD, Board Certification in Anatomic/Clinical Pathology and Cytopathology Electronically Signed     HPV DNA High Risk Not Detected Not Detect    Comment: This test was performed using the APTIMA HPV Assay ([employer] . This assay detects E6/E7 viral messenger RNA (mRNA) from 14 high-risk HPV types (16,18,31,33,35,39,45,51,52,56,58,59,66,68). . The analytical performance characteristics of this assay have been determined by [facility]. The modifications have not been cleared or approved by the FDA. This assay has been validated pursuant to the CLIA regulations and is used for clinical purposes. EXPLANATORY NOTE:  . The Pap is a screening test for cervical cancer. It is  not a diagnostic  test and is subject to false negative  and false positive results. It is most reliable when a  satisfactory sample, regularly obtained, is submitted  with relevant clinical findings and history, and when  the Pap result is evaluated along with historic and  current clinical information. .   Urine Culture     Status: Abnormal   Collection Time: 04/18/17  4:40 PM  Result Value Ref Range   MICRO NUMBER: [id number]    SPECIMEN QUALITY: ADEQUATE    Sample Source URINE    STATUS: FINAL    ISOLATE 1: Proteus mirabilis (A)    ISOLATE 2: Escherichia coli (A)       Susceptibility   Escherichia coli - URINE CULTURE NEGATIVE 2    AMOX/CLAVULANIC 8 Sensitive     AMPICILLIN >=32 Resistant     AMPICILLIN/SULBACTAM 16 Intermediate     CEFAZOLIN <=4 Not Reportable     CEFEPIME <=1 Sensitive     CEFTRIAXONE <=1 Sensitive     CIPROFLOXACIN <=0.25 Sensitive     LEVOFLOXACIN <=0.12 Sensitive     ERTAPENEM <=0.5 Sensitive     GENTAMICIN <=1 Sensitive     IMIPENEM <=0.25 Sensitive     NITROFURANTOIN <=16 Sensitive     PIP/TAZO <=4 Sensitive     TOBRAMYCIN <=1 Sensitive     TRIMETH/SULFA* >=320 Resistant      * For infections other than uncomplicated UTIcaused by E. coli, K. pneumoniae or P. mirabilis:Cefazolin is resistant if MIC > or = 8 mcg/mL.(Distinguishing susceptible versus intermediatefor  isolates with MIC < or = 4 mcg/mL requiresadditional testing.)For uncomplicated UTI caused by E. coli,K. pneumoniae or P. mirabilis: Cefazolin issusceptible if MIC <32 mcg/mL and predictssusceptible to the oral agents cefaclor, cefdinir,cefpodoxime, cefprozil, cefuroxime, cephalexinand loracarbef.Legend:S = Susceptible  I = IntermediateR = Resistant  NS = Not susceptible* = Not tested  NR = Not reported**NN = See antimicrobic comments   Proteus mirabilis - URINE CULTURE, REFLEX    AMOX/CLAVULANIC <=2 Sensitive     AMPICILLIN <=2 Sensitive     AMPICILLIN/SULBACTAM <=2 Sensitive     CEFAZOLIN* <=4 Not Reportable      * For infections other than uncomplicated UTIcaused by E. coli, K. pneumoniae or P. mirabilis:Cefazolin is resistant if MIC > or = 8 mcg/mL.(Distinguishing susceptible versus intermediatefor isolates with MIC < or = 4 mcg/mL requiresadditional testing.)For uncomplicated UTI caused by E. coli,K. pneumoniae or P. mirabilis: Cefazolin issusceptible if MIC <32 mcg/mL and predictssusceptible to the oral agents cefaclor, cefdinir,cefpodoxime, cefprozil, cefuroxime, cephalexinand loracarbef.    CEFEPIME <=1 Sensitive     CEFTRIAXONE <=1 Sensitive     CIPROFLOXACIN <=0.25 Sensitive     LEVOFLOXACIN <=0.12 Sensitive     ERTAPENEM <=0.5 Sensitive     GENTAMICIN <=1 Sensitive     NITROFURANTOIN 256 Resistant     PIP/TAZO <=4 Sensitive     TOBRAMYCIN <=1 Sensitive     TRIMETH/SULFA <=20 Sensitive      PHQ2/9: Depression screen [facility]HQ 2/9 04/18/2017 03/16/2017 12/05/2016 09/13/2016 06/14/2016  Decreased Interest 0 0 0 0 0  Down, Depressed, Hopeless 0 0 0 0 0  PHQ - 2 Score 0 0 0 0 0    Fall Risk: Fall Risk  04/18/2017 03/16/2017 12/05/2016 09/13/2016 06/14/2016  Falls in the past year? No No Yes Yes No  Number falls in past yr: - - 2 or more 2 or more -  Injury with Fall? - - No No -  Risk Factor Category  - - High  Fall Risk High Fall Risk -  Follow up - - - Falls evaluation completed -       Assessment & Plan  1. Chronic obstructive pulmonary disease with acute exacerbation (HCC)  - levofloxacin (LEVAQUIN) 500 MG tablet; Take 1 tablet (500 mg total) by mouth daily.  Dispense: 7 tablet; Refill: 0 - predniSONE (DELTASONE) 10 MG tablet; Take 1 tablet (10 mg total) by mouth daily with breakfast.  Dispense: 10 tablet; Refill: 0 - DG Chest 2 View; Future - chlorpheniramine-HYDROcodone (TUSSIONEX PENNKINETIC ER) 10-8 MG/5ML SUER; Take 5 mLs by mouth at bedtime as needed.  Dispense: 140 mL; Refill: 0  Return in one week or sooner if needed.

## 2017-05-24 NOTE — Telephone Encounter (Signed)
Copied from CRM [phone]. Topic: Quick Communication - See Telephone Encounter >> May 24, 2017  5:07 PM Everardo Pacific[place]oton, Doron Shake, [place] wrote: CRM for notification. See Telephone encounter for: Patient was calling because her cough meds are not covered by her insurance (Pt doesn't know the name of medication). And would like to know if there is a different cough medication that she can have that the insurance will cover. If someone could give her a call back about this at [phone]  05/24/17.

## 2017-05-25 ENCOUNTER — Ambulatory Visit
Admission: RE | Admit: 2017-05-25 | Discharge: 2017-05-25 | Disposition: A | Discharge status: Home / Self Care | Payer: Medicare Other | Source: Ambulatory Visit | Attending: Family Medicine | Admitting: Family Medicine

## 2017-05-25 ENCOUNTER — Other Ambulatory Visit: Payer: Self-pay | Admitting: Family Medicine

## 2017-05-25 ENCOUNTER — Telehealth: Payer: Self-pay | Admitting: Family Medicine

## 2017-05-25 DIAGNOSIS — J441 Chronic obstructive pulmonary disease with (acute) exacerbation: Secondary | ICD-10-CM

## 2017-05-25 DIAGNOSIS — R05 Cough: Secondary | ICD-10-CM | POA: Diagnosis not present

## 2017-05-25 DIAGNOSIS — R918 Other nonspecific abnormal finding of lung field: Secondary | ICD-10-CM | POA: Insufficient documentation

## 2017-05-25 DIAGNOSIS — I517 Cardiomegaly: Secondary | ICD-10-CM | POA: Diagnosis not present

## 2017-05-25 MED ORDER — BENZONATATE 100 MG PO CAPS: 100.0000 mg | ORAL_CAPSULE | Freq: Three times a day (TID) | ORAL | 0 refills | Status: DC | PRN

## 2017-05-25 NOTE — Telephone Encounter (Signed)
Copied from CRM [phone]. Topic: Quick Communication - See Telephone Encounter >> May 25, 2017 10:34 AM Everardo Pacific[employer], Nezar Buckles, [place] wrote: CRM for notification. See Telephone encounter for: Patient calling again because she would like to have half a dose of the cough medication (Patient doesn't know name of) that she had called about yesterday. Patient stated that she has the money to get the medication now. If someone could give her a call back about this at [phone]  05/25/17.

## 2017-05-25 NOTE — Progress Notes (Unsigned)
Sent tessalon perles  Patient has been notified of cough medication that has been sent to her pharmacy.

## 2017-06-15 DIAGNOSIS — M791 Myalgia, unspecified site: Secondary | ICD-10-CM | POA: Diagnosis not present

## 2017-06-15 DIAGNOSIS — G2581 Restless legs syndrome: Secondary | ICD-10-CM | POA: Diagnosis not present

## 2017-06-15 DIAGNOSIS — G5602 Carpal tunnel syndrome, left upper limb: Secondary | ICD-10-CM | POA: Diagnosis not present

## 2017-06-20 ENCOUNTER — Encounter: Payer: Self-pay | Admitting: Family Medicine

## 2017-06-20 ENCOUNTER — Ambulatory Visit (INDEPENDENT_AMBULATORY_CARE_PROVIDER_SITE_OTHER): Payer: Medicare Other | Admitting: Family Medicine

## 2017-06-20 VITALS — BP 90/60 | HR 87 | Resp 12 | Ht <= 58 in | Wt 180.0 lb

## 2017-06-20 DIAGNOSIS — G2581 Restless legs syndrome: Secondary | ICD-10-CM

## 2017-06-20 DIAGNOSIS — E1142 Type 2 diabetes mellitus with diabetic polyneuropathy: Secondary | ICD-10-CM

## 2017-06-20 DIAGNOSIS — M545 Low back pain, unspecified: Secondary | ICD-10-CM

## 2017-06-20 DIAGNOSIS — E785 Hyperlipidemia, unspecified: Secondary | ICD-10-CM

## 2017-06-20 DIAGNOSIS — I700 Atherosclerosis of aorta: Secondary | ICD-10-CM

## 2017-06-20 DIAGNOSIS — R809 Proteinuria, unspecified: Secondary | ICD-10-CM

## 2017-06-20 DIAGNOSIS — J411 Mucopurulent chronic bronchitis: Secondary | ICD-10-CM | POA: Diagnosis not present

## 2017-06-20 DIAGNOSIS — L2089 Other atopic dermatitis: Secondary | ICD-10-CM | POA: Diagnosis not present

## 2017-06-20 DIAGNOSIS — M48062 Spinal stenosis, lumbar region with neurogenic claudication: Secondary | ICD-10-CM | POA: Diagnosis not present

## 2017-06-20 DIAGNOSIS — L659 Nonscarring hair loss, unspecified: Secondary | ICD-10-CM

## 2017-06-20 DIAGNOSIS — E1129 Type 2 diabetes mellitus with other diabetic kidney complication: Secondary | ICD-10-CM

## 2017-06-20 DIAGNOSIS — R7982 Elevated C-reactive protein (CRP): Secondary | ICD-10-CM

## 2017-06-20 DIAGNOSIS — E559 Vitamin D deficiency, unspecified: Secondary | ICD-10-CM | POA: Diagnosis not present

## 2017-06-20 DIAGNOSIS — G9519 Other vascular myelopathies: Secondary | ICD-10-CM

## 2017-06-20 DIAGNOSIS — G8929 Other chronic pain: Secondary | ICD-10-CM

## 2017-06-20 LAB — POCT GLYCOSYLATED HEMOGLOBIN (HGB A1C): Hemoglobin A1C: 6.8

## 2017-06-20 MED ORDER — VITAMIN D 50 MCG (2000 UT) PO CAPS
1.0000 | ORAL_CAPSULE | Freq: Every day | ORAL | 12 refills | Status: DC
Start: 2017-06-20 — End: 2019-01-21

## 2017-06-20 MED ORDER — HYDROCODONE-ACETAMINOPHEN 5-325 MG PO TABS: 2.0000 | ORAL_TABLET | Freq: Two times a day (BID) | ORAL | 0 refills | Status: DC | PRN

## 2017-06-20 NOTE — Progress Notes (Signed)
Name: Julie Wall   MRN: [id number]    DOB: [birth year]   Date:06/20/2017       Progress Note  Subjective  Chief Complaint  Chief Complaint  Patient presents with  . Diabetes  . Hyperlipidemia    HPI  DMII with neuropathy and proteinuria: she has neurogenic claudication and history of proteinuria.. She is drinking mostly water she is still skipping meals. because she has a lack of appetite. Her hgbA1C hadgone down from 7.5% to 6.9% ,up to 7. 2 , 7.4%  7.0% 6.6% and today is 6.8% Denies polyphagia, polydipsia but she has noticed polyuria with Jardiance. She is on gabapentin and states leg pain is stable, usually more pain on the right calf than left, she also takes ACE for microalbuminuria. BP is still low, she  denies dizziness, advised to take ACE at night instead of in am's. Discussed changing from jardiance to GLP-1 agonist but she is afraid of injections and would like to continue current regiment.   Hyperlipidemia: taking Atorvastatin. Denies side effects of medications. Explained the importance of continue on medication to decrease risk of heart attacks or strokes. Last LDL was at goal  She had lung cancer screening 05/2017 and it showed aorta atherosclerosis. Discussed results with patient and continue current regiment  COPD: she quit smoking 12/03/2016. She denies daily cough,, but she is back  smoking a few cigarettes daily, she had stopped using inhalers, but had a COPD flare 05/2017 and has been using Anoro since. Breathing is to normal, no cough or wheezing  Back pain: she has back pain a few times a week, pain now 5/10, occasionally goes higher and takes hydrocodone prn. Worse with increase in activity, taking hydrocodone ( prescription lasts 3-4 months ) she stopped taking Flexeril.. She denies leg weakness, no bowel or bladder incontinence, she has numbness on left lateral thigh. Discussed risk of addiction and importance of no diversion. We decreased Hydrocodone to 30 pills  to last 3 months, Fall 2017 and she is doing well, with prn medication.Advised to take Tylenol daily. Paresthesia on leg likely from neuralgia paresthetica on left leg.   RLS: She has been taking Requip at higher dose 2 mg given by at night, she still has leg pain, aching that is worse at night, also taking gabapentin. She denies side effects of medication. Seeing neurologist   Atopic Dermatitis: doing much better on methotrexate continues to see [facility] dermatologist. Taking folic acid. She has noticed recent hair loss. Explained may be secondary to methotrexate.   Fatigue: She was seen in our office on 12/05/2016 because three days prior she developed fatigue, aching legs, and it got progressively worse ( heavy and tight) She went to church the following morning and she felt worse, with very low energy, lack of appetite followed by she noticed some nausea and tightness between shoulder blades. She quit smoking because she got scared, but is smoking again now. . We checked labs and showed very high c-reactive protein, she denies joint pains or swelling, but has joint stiffness that lasts about 15 minutes in the mornings, all labs were normal and crp improved, fatigue is back to baseline now. She had a negative myoview done by Dr. Juliann Pares 05/2016, we will recheck creactive protein today   Patient Active Problem List   Diagnosis Date Noted  . Atherosclerosis of aorta (HCC) 05/04/2017  . Pap smear abnormality of cervix with ASCUS favoring benign 04/23/2017  . Vitamin D deficiency 12/14/2016  . Obesity (BMI 30.0-34.9)  12/13/2015  . Chronic bronchitis (HCC) 05/14/2015  . DM type 2 with diabetic peripheral neuropathy (HCC) 05/14/2015  . Positive H. pylori test 04/05/2015  . Rapid urease test for Helicobacter pylori infection positive 04/05/2015  . Umbilical hernia 04/01/2015  . Exomphalos 04/01/2015  . RLS (restless legs syndrome) 01/08/2015  . Type 2 diabetes mellitus with other diabetic kidney  complication (HCC) 01/08/2015  . Neurogenic claudication 01/08/2015  . AD (atopic dermatitis) 12/28/2014  . Chronic LBP 12/28/2014  . Dyslipidemia 12/28/2014  . Gastroesophageal reflux disease without esophagitis 12/28/2014  . Microalbuminuria 12/28/2014  . Disorder of bone and cartilage 12/28/2014  . Neuralgia of left thigh 12/28/2014  . Perennial allergic rhinitis with seasonal variation 12/28/2014  . Tobacco abuse 12/28/2014  . Hypothyroidism 09/22/2005  . Hypertension, benign 09/22/2005    Past Surgical History:  Procedure Laterality Date  . CATARACT EXTRACTION Bilateral 1980  . CESAREAN SECTION    . CORNEAL TRANSPLANT Left    [facility]-Treatment for blindness.    Family History  Problem Relation Age of Onset  . Diabetes Mother   . Hypertension Mother   . Alcohol abuse Father   . Breast cancer Neg Hx     Social History   Socioeconomic History  . Marital status: Single    Spouse name: Not on file  . Number of children: 3  . Years of education: Not on file  . Highest education level: Not on file  Social Needs  . Financial resource strain: Somewhat hard  . Food insecurity - worry: Never true  . Food insecurity - inability: Sometimes true  . Transportation needs - medical: No  . Transportation needs - non-medical: No  Occupational History  . Not on file  Tobacco Use  . Smoking status: Current Every Day Smoker    Packs/day: 0.75    Years: 46.00    Pack years: 34.50    Types: Cigarettes    Start date: 01/07/1985  . Smokeless tobacco: Never Used  Substance and Sexual Activity  . Alcohol use: No    Alcohol/week: 0.0 oz  . Drug use: No  . Sexual activity: No  Other Topics Concern  . Not on file  Social History Narrative   Disabled from severe atopic dermatitis   Has 3 children   Lives alone but very connect with family ( sees sister and her children daily)   Also belongs to a church     Current Outpatient Medications:  .  aspirin 81 MG chewable tablet, Chew  1 tablet by mouth daily., Disp: , Rfl:  .  atorvastatin (LIPITOR) 40 MG tablet, Take 1 tablet (40 mg total) by mouth daily. In place of simvastatin, Disp: 90 tablet, Rfl: 1 .  empagliflozin (JARDIANCE) 25 MG TABS tablet, Take 25 mg by mouth daily., Disp: 90 tablet, Rfl: 1 .  folic acid (FOLVITE) 1 MG tablet, Take by mouth., Disp: , Rfl:  .  gabapentin (NEURONTIN) 300 MG capsule, Take 1 capsule (300 mg total) by mouth 3 (three) times daily., Disp: 270 capsule, Rfl: 1 .  HYDROcodone-acetaminophen (NORCO/VICODIN) 5-325 MG tablet, Take 2 tablets by mouth 2 (two) times daily as needed for moderate pain., Disp: 30 tablet, Rfl: 0 .  Lancet Devices (SIMPLE DIAGNOSTICS LANCING DEV) MISC, , Disp: , Rfl:  .  lisinopril (PRINIVIL,ZESTRIL) 5 MG tablet, Take 1 tablet (5 mg total) by mouth daily., Disp: 90 tablet, Rfl: 1 .  methotrexate (RHEUMATREX) 2.5 MG tablet, Take 15 mg by mouth once a week. , Disp: ,  Rfl:  .  rOPINIRole (REQUIP) 2 MG tablet, Take 1 tablet (2 mg total) by mouth daily., Disp: 30 tablet, Rfl: 0 .  triamcinolone cream (KENALOG) 0.1 %, TRIAMCINOLONE ACETONIDE, 0.1% (External Cream)  use per dermatologist. for 0 days  Quantity: 1.00;  Refills: 0   Ordered :15-Jun-2010  Alba CorySOWLES, Jamieon Lannen MD;  Started 02-Apr-2008 Active Comments: DX: 724.2, Disp: , Rfl:  .  umeclidinium-vilanterol (ANORO ELLIPTA) 62.5-25 MCG/INH AEPB, Inhale 1 puff into the lungs daily., Disp: 180 each, Rfl: 1  Allergies  Allergen Reactions  . Penicillins Rash     ROS  Constitutional: Negative for fever or weight change.  Respiratory: Negative for cough and shortness of breath.   Cardiovascular: Negative for chest pain or palpitations.  Gastrointestinal: Negative for abdominal pain, no bowel changes.  Musculoskeletal: Negative for gait problem or joint swelling.  Skin: Negative for rash.  Neurological: Negative for dizziness or headache.  No other specific complaints in a complete review of systems (except as listed in HPI  above).  Objective  Vitals:   06/20/17 1131  BP: 90/60  Pulse: 87  Resp: 12  SpO2: 96%  Weight: 180 lb (81.6 kg)  Height: 4\' 10"  (1.473 m)    Body mass index is 37.62 kg/m.  Physical Exam  Constitutional: Patient appears well-developed and well-nourished. Obese  No distress.  HEENT: head atraumatic, normocephalic, pupils equal and reactive to light, urmur heard. No BLE edema. Pulmonary/Chest: Effort normal and breath sounds normal. No respiratory distress. Abdominal: Soft.  There is no tenderness. Psychiatric: Patient has a normal mood and affect. behavior is normal. Judgment and thought content normal. Muscular Skeletal: tender during palpation of lumbar spine  Recent Results (from the past 2160 hour(s))  Pap IG and HPV (high risk) DNA detection     Status: Abnormal   Collection Time: 04/18/17  4:36 PM  Result Value Ref Range   Clinical Information:      Comment: None given   LMP:      Comment: NA   PREV. PAP:      Comment: NA   PREV. BX:      Comment: NA   HPV DNA Probe-Source      Comment: Endocervix   STATEMENT OF ADEQUACY:      Comment: SATISFACTORY FOR EVALUATION   GENERAL CATEGORIZATION: (A)     Comment: EPITHELIAL CELL ABNORMALITY   INTERPRETATION/RESULT: (A)     Comment: Atypical Squamous Cells of Undetermined Significance (ASC-US)    Comment:      Comment: This Pap test has been evaluated with computer assisted technology. Suggest clinical correlation and follow-up as clinically appropriate    CYTOTECHNOLOGIST:      Comment: EAW, CT(ASCP) CT screening location: [address], [address], [place], [place] [address]    PATHOLOGIST:      Comment: Margaretha GlassingValerie J. Fields, MD, Board Certification in Anatomic/Clinical Pathology and Cytopathology Electronically Signed    HPV DNA High Risk Not Detected Not Detect    Comment: This test was performed using the APTIMA HPV Assay ([employer] . This assay detects E6/E7 viral messenger RNA (mRNA) from  14 high-risk HPV types (16,18,31,33,35,39,45,51,52,56,58,59,66,68). . The analytical performance characteristics of this assay have been determined by [facility]. The modifications have not been cleared or approved by the FDA. This assay has been validated pursuant to the CLIA regulations and is used for clinical purposes. EXPLANATORY NOTE:  . The Pap is a screening test for cervical cancer. It is  not a diagnostic test and  is subject to false negative  and false positive results. It is most reliable when a  satisfactory sample, regularly obtained, is submitted  with relevant clinical findings and history, and when  the Pap result is evaluated along with historic and  current clinical information. .   Urine Culture     Status: Abnormal   Collection Time: 04/18/17  4:40 PM  Result Value Ref Range   MICRO NUMBER: [id number]    SPECIMEN QUALITY: ADEQUATE    Sample Source URINE    STATUS: FINAL    ISOLATE 1: Proteus mirabilis (A)    ISOLATE 2: Escherichia coli (A)       Susceptibility   Escherichia coli - URINE CULTURE NEGATIVE 2    AMOX/CLAVULANIC 8 Sensitive     AMPICILLIN >=32 Resistant     AMPICILLIN/SULBACTAM 16 Intermediate     CEFAZOLIN <=4 Not Reportable     CEFEPIME <=1 Sensitive     CEFTRIAXONE <=1 Sensitive     CIPROFLOXACIN <=0.25 Sensitive     LEVOFLOXACIN <=0.12 Sensitive     ERTAPENEM <=0.5 Sensitive     GENTAMICIN <=1 Sensitive     IMIPENEM <=0.25 Sensitive     NITROFURANTOIN <=16 Sensitive     PIP/TAZO <=4 Sensitive     TOBRAMYCIN <=1 Sensitive     TRIMETH/SULFA* >=320 Resistant      * For infections other than uncomplicated UTIcaused by E. coli, K. pneumoniae or P. mirabilis:Cefazolin is resistant if MIC > or = 8 mcg/mL.(Distinguishing susceptible versus intermediatefor isolates with MIC < or = 4 mcg/mL requiresadditional testing.)For uncomplicated UTI caused by E. coli,K. pneumoniae or P. mirabilis: Cefazolin issusceptible if MIC <32 mcg/mL and  predictssusceptible to the oral agents cefaclor, cefdinir,cefpodoxime, cefprozil, cefuroxime, cephalexinand loracarbef.Legend:S = Susceptible  I = IntermediateR = Resistant  NS = Not susceptible* = Not tested  NR = Not reported**NN = See antimicrobic comments   Proteus mirabilis - URINE CULTURE, REFLEX    AMOX/CLAVULANIC <=2 Sensitive     AMPICILLIN <=2 Sensitive     AMPICILLIN/SULBACTAM <=2 Sensitive     CEFAZOLIN* <=4 Not Reportable      * For infections other than uncomplicated UTIcaused by E. coli, K. pneumoniae or P. mirabilis:Cefazolin is resistant if MIC > or = 8 mcg/mL.(Distinguishing susceptible versus intermediatefor isolates with MIC < or = 4 mcg/mL requiresadditional testing.)For uncomplicated UTI caused by E. coli,K. pneumoniae or P. mirabilis: Cefazolin issusceptible if MIC <32 mcg/mL and predictssusceptible to the oral agents cefaclor, cefdinir,cefpodoxime, cefprozil, cefuroxime, cephalexinand loracarbef.    CEFEPIME <=1 Sensitive     CEFTRIAXONE <=1 Sensitive     CIPROFLOXACIN <=0.25 Sensitive     LEVOFLOXACIN <=0.12 Sensitive     ERTAPENEM <=0.5 Sensitive     GENTAMICIN <=1 Sensitive     NITROFURANTOIN 256 Resistant     PIP/TAZO <=4 Sensitive     TOBRAMYCIN <=1 Sensitive     TRIMETH/SULFA <=20 Sensitive   POCT glycosylated hemoglobin (Hb A1C)     Status: Abnormal   Collection Time: 06/20/17 11:34 AM  Result Value Ref Range   Hemoglobin A1C 6.8      PHQ2/9: Depression screen [facility] 2/9 04/18/2017 03/16/2017 12/05/2016 09/13/2016 06/14/2016  Decreased Interest 0 0 0 0 0  Down, Depressed, Hopeless 0 0 0 0 0  PHQ - 2 Score 0 0 0 0 0     Fall Risk: Fall Risk  06/20/2017 04/18/2017 03/16/2017 12/05/2016 09/13/2016  Falls in the past year? No No No Yes Yes  Number falls  in past yr: - - - 2 or more 2 or more  Injury with Fall? - - - No No  Risk Factor Category  - - - High Fall Risk High Fall Risk  Follow up - - - - Falls evaluation completed     Functional Status Survey: Is  the patient deaf or have difficulty hearing?: No Does the patient have difficulty seeing, even when wearing glasses/contacts?: No Does the patient have difficulty concentrating, remembering, or making decisions?: No Does the patient have difficulty walking or climbing stairs?: No Does the patient have difficulty dressing or bathing?: No Does the patient have difficulty doing errands alone such as visiting a doctor's office or shopping?: No    Assessment & Plan  1. Type 2 diabetes mellitus with microalbuminuria, without Beeney-term current use of insulin (HCC)  - POCT glycosylated hemoglobin (Hb A1C)  2. DM type 2 with diabetic peripheral neuropathy (HCC)  - POCT glycosylated hemoglobin (Hb A1C)  3. Mucopurulent chronic bronchitis (HCC)  Doing better, using Anoro   4. Vitamin D deficiency  - Cholecalciferol (VITAMIN D) 2000 units CAPS; Take 1 capsule (2,000 Units total) by mouth daily.  Dispense: 30 capsule; Refill: 12  5. RLS (restless legs syndrome)  Seeing Dr. Sherryll Burger, on gabapentin  6. Dyslipidemia  Continue statin therapy, last level was at goal  7. Elevated C-reactive protein  - CRP High sensitivity  8. Chronic bilateral low back pain without sciatica  - HYDROcodone-acetaminophen (NORCO/VICODIN) 5-325 MG tablet; Take 2 tablets by mouth 2 (two) times daily as needed for moderate pain.  Dispense: 30 tablet; Refill: 0  9. Other atopic dermatitis  Doing well, keep follow up with dermatologist   10. Atherosclerosis of aorta (HCC)  Continue aspirin, statin therapy and bp control   11. Neurogenic claudication  - HYDROcodone-acetaminophen (NORCO/VICODIN) 5-325 MG tablet; Take 2 tablets by mouth 2 (two) times daily as needed for moderate pain.  Dispense: 30 tablet; Refill: 0  12. Hair loss  - TSH

## 2017-06-21 LAB — HIGH SENSITIVITY CRP: hs-CRP: 11 mg/L — ABNORMAL HIGH

## 2017-06-21 LAB — TSH: TSH: 1.22 mIU/L (ref 0.40–4.50)

## 2017-07-06 ENCOUNTER — Other Ambulatory Visit: Payer: Self-pay

## 2017-07-06 MED ORDER — TRIAMCINOLONE ACETONIDE 0.1 % EX CREA: TOPICAL_CREAM | CUTANEOUS | 3 refills | Status: DC

## 2017-07-06 NOTE — Telephone Encounter (Signed)
Refill request for general medication: Triamcinolone acetone 0.1% topical ointment.  Last office visit: 06/20/2017  Last physical exam: 04/18/2017  Follow-up on file. 10/19/2017

## 2017-07-12 DIAGNOSIS — Z5181 Encounter for therapeutic drug level monitoring: Secondary | ICD-10-CM | POA: Diagnosis not present

## 2017-07-12 DIAGNOSIS — L2084 Intrinsic (allergic) eczema: Secondary | ICD-10-CM | POA: Diagnosis not present

## 2017-07-12 DIAGNOSIS — L2089 Other atopic dermatitis: Secondary | ICD-10-CM | POA: Diagnosis not present

## 2017-09-19 ENCOUNTER — Other Ambulatory Visit: Payer: Self-pay

## 2017-09-19 DIAGNOSIS — R809 Proteinuria, unspecified: Secondary | ICD-10-CM

## 2017-09-19 DIAGNOSIS — E1129 Type 2 diabetes mellitus with other diabetic kidney complication: Secondary | ICD-10-CM

## 2017-09-19 MED ORDER — LISINOPRIL 5 MG PO TABS: 5.0000 mg | ORAL_TABLET | Freq: Every day | ORAL | 1 refills | Status: DC

## 2017-09-19 NOTE — Telephone Encounter (Signed)
Refill request for Hypertension medication:  Lisinopril 5 mg  Last office visit pertaining to hypertension: 06/20/2017  BP Readings from Last 3 Encounters:  06/20/17 90/60  05/24/17 (!) 102/58  04/18/17 98/60     Lab Results  Component Value Date   CREATININE 0.66 03/16/2017   BUN 10 03/16/2017   NA 142 03/16/2017   K 4.3 03/16/2017   CL 106 03/16/2017   CO2 30 03/16/2017    Follow-ups on file. 10/19/2017

## 2017-10-04 ENCOUNTER — Other Ambulatory Visit: Payer: Self-pay

## 2017-10-04 DIAGNOSIS — E1142 Type 2 diabetes mellitus with diabetic polyneuropathy: Secondary | ICD-10-CM

## 2017-10-04 DIAGNOSIS — E1129 Type 2 diabetes mellitus with other diabetic kidney complication: Secondary | ICD-10-CM

## 2017-10-05 MED ORDER — EMPAGLIFLOZIN 25 MG PO TABS: 25.0000 mg | ORAL_TABLET | Freq: Every day | ORAL | 1 refills | Status: DC

## 2017-10-16 ENCOUNTER — Other Ambulatory Visit: Payer: Self-pay | Admitting: Family Medicine

## 2017-10-16 DIAGNOSIS — Z1231 Encounter for screening mammogram for malignant neoplasm of breast: Secondary | ICD-10-CM

## 2017-10-19 ENCOUNTER — Ambulatory Visit (INDEPENDENT_AMBULATORY_CARE_PROVIDER_SITE_OTHER): Payer: Medicare Other | Admitting: Family Medicine

## 2017-10-19 ENCOUNTER — Encounter: Payer: Self-pay | Admitting: Family Medicine

## 2017-10-19 VITALS — BP 100/68 | HR 90 | Resp 16 | Ht <= 58 in | Wt 182.4 lb

## 2017-10-19 DIAGNOSIS — G9519 Other vascular myelopathies: Secondary | ICD-10-CM

## 2017-10-19 DIAGNOSIS — J441 Chronic obstructive pulmonary disease with (acute) exacerbation: Secondary | ICD-10-CM | POA: Diagnosis not present

## 2017-10-19 DIAGNOSIS — G2581 Restless legs syndrome: Secondary | ICD-10-CM

## 2017-10-19 DIAGNOSIS — I700 Atherosclerosis of aorta: Secondary | ICD-10-CM

## 2017-10-19 DIAGNOSIS — E559 Vitamin D deficiency, unspecified: Secondary | ICD-10-CM

## 2017-10-19 DIAGNOSIS — R809 Proteinuria, unspecified: Secondary | ICD-10-CM | POA: Diagnosis not present

## 2017-10-19 DIAGNOSIS — J411 Mucopurulent chronic bronchitis: Secondary | ICD-10-CM

## 2017-10-19 DIAGNOSIS — E1142 Type 2 diabetes mellitus with diabetic polyneuropathy: Secondary | ICD-10-CM | POA: Diagnosis not present

## 2017-10-19 DIAGNOSIS — E1129 Type 2 diabetes mellitus with other diabetic kidney complication: Secondary | ICD-10-CM

## 2017-10-19 DIAGNOSIS — Z720 Tobacco use: Secondary | ICD-10-CM

## 2017-10-19 DIAGNOSIS — M48062 Spinal stenosis, lumbar region with neurogenic claudication: Secondary | ICD-10-CM

## 2017-10-19 DIAGNOSIS — M545 Low back pain, unspecified: Secondary | ICD-10-CM

## 2017-10-19 DIAGNOSIS — E785 Hyperlipidemia, unspecified: Secondary | ICD-10-CM | POA: Diagnosis not present

## 2017-10-19 DIAGNOSIS — R7982 Elevated C-reactive protein (CRP): Secondary | ICD-10-CM | POA: Diagnosis not present

## 2017-10-19 DIAGNOSIS — R7989 Other specified abnormal findings of blood chemistry: Secondary | ICD-10-CM | POA: Diagnosis not present

## 2017-10-19 DIAGNOSIS — G8929 Other chronic pain: Secondary | ICD-10-CM

## 2017-10-19 LAB — POCT GLYCOSYLATED HEMOGLOBIN (HGB A1C): HEMOGLOBIN A1C: 7.1

## 2017-10-19 MED ORDER — UMECLIDINIUM-VILANTEROL 62.5-25 MCG/INH IN AEPB: 1.0000 | INHALATION_SPRAY | Freq: Every day | RESPIRATORY_TRACT | 2 refills | Status: DC

## 2017-10-19 MED ORDER — HYDROCODONE-ACETAMINOPHEN 5-325 MG PO TABS: 2.0000 | ORAL_TABLET | Freq: Two times a day (BID) | ORAL | 0 refills | Status: DC | PRN

## 2017-10-19 MED ORDER — ALBUTEROL SULFATE HFA 108 (90 BASE) MCG/ACT IN AERS: 2.0000 | INHALATION_SPRAY | Freq: Four times a day (QID) | RESPIRATORY_TRACT | 0 refills | Status: DC | PRN

## 2017-10-19 MED ORDER — METFORMIN HCL ER 750 MG PO TB24: 750.0000 mg | ORAL_TABLET | Freq: Every day | ORAL | 1 refills | Status: DC

## 2017-10-19 NOTE — Progress Notes (Signed)
Name: Julie Wall   MRN: [id number]    DOB: 06/05/1956   Date:10/19/2017       Progress Note  Subjective  Chief Complaint  Chief Complaint  Patient presents with  . Diabetes  . Hypothyroidism  . Hypertension  . Gastroesophageal Reflux    HPI  DMII with neuropathy and proteinuria: she has neurogenic claudication and history of proteinuria.. She is drinking mostly watershe is still skipping meals.because she has a lack of appetite. Her hgbA1C hadgone down from 7.5% to 6.9% ,up to 7. 2 , 7.4%  7.0% 6.6, 6.8%, but up again at 7.1%  Denies polyphagia, polydipsia but she has noticed polyuria with Jardiance. She is on gabapentin and states leg pain is stable, usually more pain on the right calf than left, she also takes ACE for microalbuminuria. BP is still low, she  denies dizziness, advised to take ACE at night instead of in am's. She is willing to continue Jardiance and we will add Metformin, discussed possible side effects. Due for eye exam  Hyperlipidemia: taking Atorvastatin. Denies side effects of medications. Explained the importance of continue on medication to decrease risk of heart attacks or strokes. Last LDL was at goal  She had lung cancer screening 05/2017 and it showed aorta atherosclerosis. Recheck labs today .   COPD: she quit smoking 12/03/2016, but resumed smoking again since. She is coughing daily again and had a flare last week with productive cough and also intermittent SOB, discussed importance of quitting smoking again. We will resume Anoro and prn Ventolin but quitting smoking is the best thing she can do to improve symptoms.   Back pain: she has back pain a few times a week, pain now 5/10, occasionally goes higher and takes hydrocodone prn. Worse with increase in activity, taking hydrocodone ( prescription lasts 3-4 months ) she stopped taking Flexeril.. She denies leg weakness, no bowel or bladder incontinence, she has numbness on left lateral thigh. Discussed risk  of addiction and importance of no diversion. We decreased Hydrocodone to 30 pills to last 3 months, Fall 2017 and she is doing well, with prn medication.Advised to take Tylenol daily. Paresthesia on leg likely from neuralgia paresthetica on left leg. She also has calf pain secondary to neurogenic claudications  RLS: She has not been taking Requip, occasionally has symptoms. Seen by neurologist in the past   Atopic Dermatitis: doing much better on methotrexate continues to see [facility] dermatologist. Taking folic acid. Hair loss is stable. Explained may be secondary to methotrexate.   Fatigue: She was seen in our office on 12/05/2016 because three days prior she developed fatigue, aching legs, and it got progressively worse ( heavy and tight) She went to church the following morning with very low energy, lack of appetite followed by she noticed some nausea and tightness between shoulder blades. She quit smoking because she got scared, but is smoking again now, however just about 4 cigarettes per day .Marland Kitchen We checked labs and showed very high c-reactive protein, she denies joint pains or swelling, but has joint stiffness that lasts about 15 minutes in the mornings, all labs were normal and crp improved, fatigue is back to baseline . She had a negative myoview done by Dr. Juliann Pares 05/2017. She denies synovitis, we will recheck labs, not sure of the cause, discussed referral to Rheumatologist but she is not interested at this time   Patient Active Problem List   Diagnosis Date Noted  . Atherosclerosis of aorta (HCC) 05/04/2017  . Pap  smear abnormality of cervix with ASCUS favoring benign 04/23/2017  . Vitamin D deficiency 12/14/2016  . Obesity (BMI 30.0-34.9) 12/13/2015  . Chronic bronchitis (HCC) 05/14/2015  . DM type 2 with diabetic peripheral neuropathy (HCC) 05/14/2015  . Positive H. pylori test 04/05/2015  . Rapid urease test for Helicobacter pylori infection positive 04/05/2015  . Umbilical  hernia 04/01/2015  . Exomphalos 04/01/2015  . RLS (restless legs syndrome) 01/08/2015  . Type 2 diabetes mellitus with other diabetic kidney complication (HCC) 01/08/2015  . Neurogenic claudication 01/08/2015  . AD (atopic dermatitis) 12/28/2014  . Chronic LBP 12/28/2014  . Dyslipidemia 12/28/2014  . Gastroesophageal reflux disease without esophagitis 12/28/2014  . Microalbuminuria 12/28/2014  . Disorder of bone and cartilage 12/28/2014  . Neuralgia of left thigh 12/28/2014  . Perennial allergic rhinitis with seasonal variation 12/28/2014  . Tobacco abuse 12/28/2014  . Hypothyroidism 09/22/2005  . Hypertension, benign 09/22/2005    Past Surgical History:  Procedure Laterality Date  . CATARACT EXTRACTION Bilateral 1980  . CESAREAN SECTION    . CORNEAL TRANSPLANT Left    [facility]-Treatment for blindness.    Family History  Problem Relation Age of Onset  . Diabetes Mother   . Hypertension Mother   . Alcohol abuse Father   . Breast cancer Neg Hx     Social History   Socioeconomic History  . Marital status: Single    Spouse name: Not on file  . Number of children: 3  . Years of education: Not on file  . Highest education level: Not on file  Occupational History  . Not on file  Social Needs  . Financial resource strain: Somewhat hard  . Food insecurity:    Worry: Never true    Inability: Sometimes true  . Transportation needs:    Medical: No    Non-medical: No  Tobacco Use  . Smoking status: Current Every Day Smoker    Packs/day: 0.25    Years: 46.00    Pack years: 11.50    Types: Cigarettes    Start date: 01/07/1985  . Smokeless tobacco: Never Used  . Tobacco comment: trying to quit  -used to smoke heavily in the past   Substance and Sexual Activity  . Alcohol use: No    Alcohol/week: 0.0 oz  . Drug use: No  . Sexual activity: Never  Lifestyle  . Physical activity:    Days per week: 0 days    Minutes per session: 0 min  . Stress: Not at all  Relationships   . Social connections:    Talks on phone: More than three times a week    Gets together: More than three times a week    Attends religious service: More than 4 times per year    Active member of club or organization: Yes    Attends meetings of clubs or organizations: More than 4 times per year    Relationship status: Never married  . Intimate partner violence:    Fear of current or ex partner: No    Emotionally abused: No    Physically abused: No    Forced sexual activity: No  Other Topics Concern  . Not on file  Social History Narrative   Disabled from severe atopic dermatitis   Has 3 children   Lives alone but very connect with family ( sees sister and her children daily)   Also belongs to a church     Current Outpatient Medications:  .  aspirin 81 MG chewable tablet,  Chew 1 tablet by mouth daily., Disp: , Rfl:  .  atorvastatin (LIPITOR) 40 MG tablet, Take 1 tablet (40 mg total) by mouth daily. In place of simvastatin, Disp: 90 tablet, Rfl: 1 .  Cholecalciferol (VITAMIN D) 2000 units CAPS, Take 1 capsule (2,000 Units total) by mouth daily., Disp: 30 capsule, Rfl: 12 .  empagliflozin (JARDIANCE) 25 MG TABS tablet, Take 25 mg by mouth daily., Disp: 90 tablet, Rfl: 1 .  folic acid (FOLVITE) 1 MG tablet, Take by mouth., Disp: , Rfl:  .  gabapentin (NEURONTIN) 300 MG capsule, Take 1 capsule (300 mg total) by mouth 3 (three) times daily., Disp: 270 capsule, Rfl: 1 .  HYDROcodone-acetaminophen (NORCO/VICODIN) 5-325 MG tablet, Take 2 tablets by mouth 2 (two) times daily as needed for moderate pain., Disp: 30 tablet, Rfl: 0 .  Lancet Devices (SIMPLE DIAGNOSTICS LANCING DEV) MISC, , Disp: , Rfl:  .  lisinopril (PRINIVIL,ZESTRIL) 5 MG tablet, Take 1 tablet (5 mg total) by mouth daily., Disp: 90 tablet, Rfl: 1 .  methotrexate (RHEUMATREX) 2.5 MG tablet, Take 15 mg by mouth once a week. , Disp: , Rfl:  .  triamcinolone cream (KENALOG) 0.1 %, TRIAMCINOLONE ACETONIDE, 0.1% (External Cream)  use  per dermatologist. for 0 days  Quantity: 1.00;  Refills: 0   Ordered :15-Jun-2010  Alba Cory MD;  Started 02-Apr-2008 Active Comments: DX: 724.2, Disp: 30 g, Rfl: 3 .  albuterol (PROVENTIL HFA;VENTOLIN HFA) 108 (90 Base) MCG/ACT inhaler, Inhale 2 puffs into the lungs every 6 (six) hours as needed for wheezing or shortness of breath., Disp: 1 Inhaler, Rfl: 0 .  metFORMIN (GLUCOPHAGE-XR) 750 MG 24 hr tablet, Take 1 tablet (750 mg total) by mouth daily with breakfast., Disp: 90 tablet, Rfl: 1 .  umeclidinium-vilanterol (ANORO ELLIPTA) 62.5-25 MCG/INH AEPB, Inhale 1 puff into the lungs daily., Disp: 180 each, Rfl: 2  Allergies  Allergen Reactions  . Penicillins Rash     ROS  Constitutional: Negative for fever or weight change.  Respiratory: Positive  for cough and occasional shortness of breath.   Cardiovascular: Negative for chest pain or palpitations.  Gastrointestinal: Negative for abdominal pain, no bowel changes.  Musculoskeletal: positive  for gait problem ( secondary to claudication)  But no joint swelling.  Skin: Negative for rash.  Neurological: Negative for dizziness or headache.  No other specific complaints in a complete review of systems (except as listed in HPI above).  Objective  Vitals:   10/19/17 0851  BP: 100/68  Pulse: 90  Resp: 16  SpO2: 97%  Weight: 182 lb 6.4 oz (82.7 kg)  Height:  (1.473 m)    Body mass index is 38.12 kg/m.  Physical Exam  Constitutional: Patient appears well-developed and well-nourished. Obese No distress.  HEENT: head atraumatic, normocephalic, pupils equal and reactive to light, neck supple, throat within normal limits Cardiovascular: Normal rate, regular rhythm and normal heart sounds.  No murmur heard. No BLE edema. Pulmonary/Chest: Effort normal and breath sounds normal. No respiratory distress. Abdominal: Soft.  There is no tenderness. Skin: much better, no dry spots, hypopigmentation healing, hyperpigmentation on face  still present  Psychiatric: Patient has a normal mood and affect. behavior is normal. Judgment and thought content normal.  Recent Results (from the past 2160 hour(s))  POCT HgB A1C     Status: Abnormal   Collection Time: 10/19/17  9:21 AM  Result Value Ref Range   Hemoglobin A1C 7.1     Diabetic Foot Exam: Diabetic Foot Exam -  Simple   Simple Foot Form Visual Inspection See comments:  Yes Sensation Testing Intact to touch and monofilament testing bilaterally:  Yes Pulse Check Posterior Tibialis and Dorsalis pulse intact bilaterally:  Yes Comments Some corn formation, bunions       PHQ2/9: Depression screen [facility] 2/9 10/19/2017 04/18/2017 03/16/2017 12/05/2016 09/13/2016  Decreased Interest 0 0 0 0 0  Down, Depressed, Hopeless 0 0 0 0 0  PHQ - 2 Score 0 0 0 0 0     Fall Risk: Fall Risk  10/19/2017 06/20/2017 04/18/2017 03/16/2017 12/05/2016  Falls in the past year? No No No No Yes  Number falls in past yr: - - - - 2 or more  Injury with Fall? - - - - No  Risk Factor Category  - - - - High Fall Risk  Follow up - - - - -     Functional Status Survey: Is the patient deaf or have difficulty hearing?: No Does the patient have difficulty seeing, even when wearing glasses/contacts?: No Does the patient have difficulty concentrating, remembering, or making decisions?: No Does the patient have difficulty walking or climbing stairs?: No Does the patient have difficulty dressing or bathing?: No Does the patient have difficulty doing errands alone such as visiting a doctor's office or shopping?: No    Assessment & Plan  1. Type 2 diabetes mellitus with microalbuminuria, without Merry-term current use of insulin (HCC)  - POCT HgB A1C - metFORMIN (GLUCOPHAGE-XR) 750 MG 24 hr tablet; Take 1 tablet (750 mg total) by mouth daily with breakfast.  Dispense: 90 tablet; Refill: 1  2. Dyslipidemia  - Lipid panel  3. Atherosclerosis of aorta (HCC)  - Lipid panel  4. RLS (restless  legs syndrome)   5. Chronic obstructive pulmonary disease with acute exacerbation (HCC)  Resume medication   6. Vitamin D deficiency  - VITAMIN D 25 Hydroxy (Vit-D Deficiency, Fractures)  7. Tobacco use  Needs to quit smoking   8. Microalbuminuria  On ACE  9. Neurogenic claudication  - HYDROcodone-acetaminophen (NORCO/VICODIN) 5-325 MG tablet; Take 2 tablets by mouth 2 (two) times daily as needed for moderate pain.  Dispense: 30 tablet; Refill: 0  10. Elevated C-reactive protein  - CBC with Differential/Platelet - COMPLETE METABOLIC PANEL WITH GFR - Sedimentation rate - C-reactive protein - CRP High sensitivity  11. DM type 2 with diabetic peripheral neuropathy (HCC)  - metFORMIN (GLUCOPHAGE-XR) 750 MG 24 hr tablet; Take 1 tablet (750 mg total) by mouth daily with breakfast.  Dispense: 90 tablet; Refill: 1  12. Morbid obesity (HCC)  She has DM, dyslipidemia and needs to lose weight  13. Mucopurulent chronic bronchitis (HCC)  - umeclidinium-vilanterol (ANORO ELLIPTA) 62.5-25 MCG/INH AEPB; Inhale 1 puff into the lungs daily.  Dispense: 180 each; Refill: 2  14. Chronic bilateral low back pain without sciatica  - HYDROcodone-acetaminophen (NORCO/VICODIN) 5-325 MG tablet; Take 2 tablets by mouth 2 (two) times daily as needed for moderate pain.  Dispense: 30 tablet; Refill: 0

## 2017-10-19 NOTE — Patient Instructions (Signed)

## 2017-10-20 LAB — HIGH SENSITIVITY CRP: hs-CRP: >10 mg/L — ABNORMAL HIGH

## 2017-10-23 ENCOUNTER — Other Ambulatory Visit: Payer: Self-pay | Admitting: Family Medicine

## 2017-10-23 MED ORDER — VITAMIN D (ERGOCALCIFEROL) 1.25 MG (50000 UNIT) PO CAPS: 50000.0000 [IU] | ORAL_CAPSULE | ORAL | 0 refills | Status: DC

## 2017-10-25 DIAGNOSIS — L2089 Other atopic dermatitis: Secondary | ICD-10-CM | POA: Diagnosis not present

## 2017-10-25 DIAGNOSIS — Z79899 Other long term (current) drug therapy: Secondary | ICD-10-CM | POA: Diagnosis not present

## 2017-10-25 DIAGNOSIS — L2084 Intrinsic (allergic) eczema: Secondary | ICD-10-CM | POA: Diagnosis not present

## 2017-10-25 LAB — CBC WITH DIFFERENTIAL/PLATELET
BASOS PCT: 0.4 %
Basophils Absolute: 32 cells/uL (ref 0–200)
EOS ABS: 253 {cells}/uL (ref 15–500)
Eosinophils Relative: 3.2 %
HCT: 42 % (ref 35.0–45.0)
HEMOGLOBIN: 14.3 g/dL (ref 11.7–15.5)
Lymphs Abs: 2718 cells/uL (ref 850–3900)
MCH: 31.2 pg (ref 27.0–33.0)
MCHC: 34 g/dL (ref 32.0–36.0)
MCV: 91.5 fL (ref 80.0–100.0)
MONOS PCT: 5.9 %
MPV: 11.5 fL (ref 7.5–12.5)
NEUTROS ABS: 4432 {cells}/uL (ref 1500–7800)
Neutrophils Relative %: 56.1 %
Platelets: 264 10*3/uL (ref 140–400)
RBC: 4.59 10*6/uL (ref 3.80–5.10)
RDW: 13.3 % (ref 11.0–15.0)
Total Lymphocyte: 34.4 %
WBC: 7.9 10*3/uL (ref 3.8–10.8)
WBCMIX: 466 {cells}/uL (ref 200–950)

## 2017-10-25 LAB — COMPLETE METABOLIC PANEL WITH GFR
AG Ratio: 1.2 (calc) (ref 1.0–2.5)
ALBUMIN MSPROF: 3.9 g/dL (ref 3.6–5.1)
ALT: 10 U/L (ref 6–29)
AST: 12 U/L (ref 10–35)
Alkaline phosphatase (APISO): 90 U/L (ref 33–130)
BILIRUBIN TOTAL: 0.2 mg/dL (ref 0.2–1.2)
BUN: 11 mg/dL (ref 7–25)
CALCIUM: 9.3 mg/dL (ref 8.6–10.4)
CHLORIDE: 107 mmol/L (ref 98–110)
CO2: 25 mmol/L (ref 20–32)
Creat: 0.62 mg/dL (ref 0.50–0.99)
GFR, EST AFRICAN AMERICAN: 111 mL/min/{1.73_m2} (ref >=60)
GFR, EST NON AFRICAN AMERICAN: 96 mL/min/{1.73_m2} (ref >=60)
Globulin: 3.2 g/dL (calc) (ref 1.9–3.7)
Glucose, Bld: 107 mg/dL — ABNORMAL HIGH (ref 65–99)
Potassium: 4.3 mmol/L (ref 3.5–5.3)
Sodium: 140 mmol/L (ref 135–146)
TOTAL PROTEIN: 7.1 g/dL (ref 6.1–8.1)

## 2017-10-25 LAB — SEDIMENTATION RATE: SED RATE: 19 mm/h (ref 0–30)

## 2017-10-25 LAB — LIPID PANEL
CHOL/HDL RATIO: 2.5 (calc) (ref <5.0)
Cholesterol: 130 mg/dL (ref <200)
HDL: 52 mg/dL (ref >50)
LDL CHOLESTEROL (CALC): 63 mg/dL
NON-HDL CHOLESTEROL (CALC): 78 mg/dL (ref <130)
TRIGLYCERIDES: 70 mg/dL (ref <150)

## 2017-10-25 LAB — VITAMIN D 25 HYDROXY (VIT D DEFICIENCY, FRACTURES): VIT D 25 HYDROXY: 15 ng/mL — AB (ref 30–100)

## 2017-10-25 LAB — C-REACTIVE PROTEIN: CRP: 19.7 mg/L — ABNORMAL HIGH (ref <8.0)

## 2017-11-05 ENCOUNTER — Ambulatory Visit
Admission: RE | Admit: 2017-11-05 | Discharge: 2017-11-05 | Disposition: A | Discharge status: Home / Self Care | Payer: Medicare Other | Source: Ambulatory Visit | Attending: Family Medicine | Admitting: Family Medicine

## 2017-11-05 DIAGNOSIS — Z1231 Encounter for screening mammogram for malignant neoplasm of breast: Secondary | ICD-10-CM | POA: Diagnosis not present

## 2017-11-20 DIAGNOSIS — R011 Cardiac murmur, unspecified: Secondary | ICD-10-CM | POA: Diagnosis not present

## 2017-11-20 DIAGNOSIS — M4800 Spinal stenosis, site unspecified: Secondary | ICD-10-CM | POA: Diagnosis not present

## 2017-11-20 DIAGNOSIS — I208 Other forms of angina pectoris: Secondary | ICD-10-CM | POA: Diagnosis not present

## 2017-11-20 DIAGNOSIS — R2689 Other abnormalities of gait and mobility: Secondary | ICD-10-CM | POA: Diagnosis not present

## 2017-11-20 DIAGNOSIS — R0602 Shortness of breath: Secondary | ICD-10-CM | POA: Diagnosis not present

## 2017-11-27 DIAGNOSIS — Z79899 Other long term (current) drug therapy: Secondary | ICD-10-CM | POA: Diagnosis not present

## 2017-11-27 DIAGNOSIS — K740 Hepatic fibrosis: Secondary | ICD-10-CM | POA: Diagnosis not present

## 2017-12-13 DIAGNOSIS — M792 Neuralgia and neuritis, unspecified: Secondary | ICD-10-CM | POA: Diagnosis not present

## 2017-12-13 DIAGNOSIS — G5602 Carpal tunnel syndrome, left upper limb: Secondary | ICD-10-CM | POA: Diagnosis not present

## 2017-12-13 DIAGNOSIS — M791 Myalgia, unspecified site: Secondary | ICD-10-CM | POA: Diagnosis not present

## 2017-12-13 DIAGNOSIS — G2581 Restless legs syndrome: Secondary | ICD-10-CM | POA: Diagnosis not present

## 2018-01-03 ENCOUNTER — Other Ambulatory Visit: Payer: Self-pay

## 2018-01-03 NOTE — Patient Outreach (Signed)
[facility] Care Management  01/03/2018  [name] 10/04/1955 [id number]   Medication Adherence call to Mrs. Julie Wall spoke with patient she said she already order all her medication and are ready at the pharmacy for patient to pick up patient did not want any assistance with her medication. [name] is showing past due under [employer]on Lisinopril 20 mg and Janumet  50/1000    Lillia AbedAna Ollison-Moran CPhT Pharmacy Technician [facility] Care Management Direct Dial [phone]  Fax [phone] [email or web address]

## 2018-01-07 ENCOUNTER — Telehealth: Payer: Self-pay | Admitting: Family Medicine

## 2018-01-07 NOTE — Telephone Encounter (Signed)
Copied from CRM [id number]. Topic: Quick Communication - See Telephone Encounter >> Jan 07, 2018  5:03 PM Lorrine KinMcGee, Kamarion Zagami B, NT wrote: CRM for notification. See Telephone encounter for: 01/07/18. Joy with [facility] calling and states that they has some questions regarding the following medications: metFORMIN (GLUCOPHAGE-XR) 750 MG 24 hr tablet Janumet 50 MG/1000 MG  States that the janumet has 1000 MG of metformin in it. Is Dr Carlynn PurlSowles aware of this? Does she want the patient to take both of these medications together? CB#: [phone]

## 2018-01-08 NOTE — Telephone Encounter (Signed)
She is supposed to be off Janumet She is now on Jardiance and Metformin

## 2018-01-08 NOTE — Telephone Encounter (Signed)
Please review message and advise

## 2018-01-08 NOTE — Telephone Encounter (Signed)
[facility] and she stated that she was going to inactivate the Janumet and fill the Metformin and Jardiance as prescribed by Dr. Carlynn PurlSowles.   She also stated that she was going to call this patient to inform her of this.

## 2018-01-16 ENCOUNTER — Encounter: Payer: Self-pay | Admitting: Family Medicine

## 2018-01-16 ENCOUNTER — Ambulatory Visit (INDEPENDENT_AMBULATORY_CARE_PROVIDER_SITE_OTHER): Payer: Medicare Other | Admitting: Family Medicine

## 2018-01-16 VITALS — BP 108/62 | HR 90 | Temp 98.1°F | Resp 16 | Ht <= 58 in | Wt 182.4 lb

## 2018-01-16 DIAGNOSIS — G8929 Other chronic pain: Secondary | ICD-10-CM

## 2018-01-16 DIAGNOSIS — R809 Proteinuria, unspecified: Secondary | ICD-10-CM

## 2018-01-16 DIAGNOSIS — M545 Low back pain: Secondary | ICD-10-CM | POA: Diagnosis not present

## 2018-01-16 DIAGNOSIS — E785 Hyperlipidemia, unspecified: Secondary | ICD-10-CM

## 2018-01-16 DIAGNOSIS — G9519 Other vascular myelopathies: Secondary | ICD-10-CM

## 2018-01-16 DIAGNOSIS — E1142 Type 2 diabetes mellitus with diabetic polyneuropathy: Secondary | ICD-10-CM | POA: Diagnosis not present

## 2018-01-16 DIAGNOSIS — J411 Mucopurulent chronic bronchitis: Secondary | ICD-10-CM

## 2018-01-16 DIAGNOSIS — M48062 Spinal stenosis, lumbar region with neurogenic claudication: Secondary | ICD-10-CM | POA: Diagnosis not present

## 2018-01-16 DIAGNOSIS — R7982 Elevated C-reactive protein (CRP): Secondary | ICD-10-CM

## 2018-01-16 DIAGNOSIS — E1129 Type 2 diabetes mellitus with other diabetic kidney complication: Secondary | ICD-10-CM | POA: Diagnosis not present

## 2018-01-16 DIAGNOSIS — E559 Vitamin D deficiency, unspecified: Secondary | ICD-10-CM

## 2018-01-16 LAB — POCT UA - MICROALBUMIN: Microalbumin Ur, POC: NEGATIVE mg/L

## 2018-01-16 LAB — POCT GLYCOSYLATED HEMOGLOBIN (HGB A1C): HbA1c, POC (prediabetic range): 6.5 % — AB (ref 5.7–6.4)

## 2018-01-16 MED ORDER — METFORMIN HCL ER 750 MG PO TB24: 750.0000 mg | ORAL_TABLET | Freq: Every day | ORAL | 1 refills | Status: DC

## 2018-01-16 MED ORDER — LISINOPRIL 5 MG PO TABS: 5.0000 mg | ORAL_TABLET | Freq: Every day | ORAL | 1 refills | Status: DC

## 2018-01-16 MED ORDER — ATORVASTATIN CALCIUM 40 MG PO TABS: 40.0000 mg | ORAL_TABLET | Freq: Every day | ORAL | 1 refills | Status: DC

## 2018-01-16 MED ORDER — ONETOUCH ULTRA MINI W/DEVICE KIT: 1.0000 | PACK | Freq: Every day | 0 refills | Status: DC

## 2018-01-16 MED ORDER — VITAMIN D (ERGOCALCIFEROL) 1.25 MG (50000 UNIT) PO CAPS: 50000.0000 [IU] | ORAL_CAPSULE | ORAL | 0 refills | Status: DC

## 2018-01-16 MED ORDER — HYDROCODONE-ACETAMINOPHEN 5-325 MG PO TABS: 2.0000 | ORAL_TABLET | Freq: Two times a day (BID) | ORAL | 0 refills | Status: DC | PRN

## 2018-01-16 MED ORDER — EMPAGLIFLOZIN 25 MG PO TABS: 25.0000 mg | ORAL_TABLET | Freq: Every day | ORAL | 1 refills | Status: DC

## 2018-01-16 NOTE — Progress Notes (Signed)
Name: Julie Wall   MRN: [id number]    DOB: [birth year]   Date:01/16/2018       Progress Note  Subjective  Chief Complaint  Chief Complaint  Patient presents with  . Follow-up    3 month f/u  . Diabetes    does not check her sugar at home  . Hyperlipidemia  . COPD  . Back Pain  . RLS  . Eczema  . Fatigue  . Fall    had a fall 3 weeks ago at [employer] states her right hip has been bothering her since and very sore, aching off and on    HPI  DMII with neuropathy and proteinuria: she has neurogenic claudication and history of proteinuria.. She is drinking mostly watershe is still skipping meals.because she has a lack of appetite. Her hgbA1C hadgone down from 7.5% to 6.9% ,up to 7. 2 , 7.4% 7.0% 6.6, 6.8%,7.1% and now is down to 6.5% Denies polyphagia, polydipsia but she has noticed polyuria with Jardiance also on metformin ( she was taking Janumet with metformin by mistake but stopped after about one month)  She is on gabapentin and states leg pain is stable, usually more pain on the right calf than left, shealso takes ACE for microalbuminuria. BP isstill low, shedenies dizziness, advised to take ACE at night instead of in am's.   Hyperlipidemia: out of Atorvastatin, needs a refill. Denies side effects of medications. Explained the importance of continue on medication to decrease risk of heart attacks or strokes. Last LDL was at goalShe had lung cancer screening 05/2017 and it showed aorta atherosclerosis.    COPD: she quit smoking 12/03/2016, but resumed smoking again but only about 4 cigarettes daily . She denies daily cough, states inhaler helps with symptoms. No wheezing or SOB  Back pain: she has back pain a few times a week, pain now 1/10 at rest, but goes up to 3/10 when walking, occasionally goes higher and takes hydrocodone prn. Worse with increase in activity, taking hydrocodone ( prescription lasts 3-4 months ) she stopped taking Flexeril. She denies  leg weakness, no bowel or bladder incontinence, she has numbness on left lateral thigh. Discussed risk of addiction and importance of no diversion. We decreased Hydrocodone to 30 pills to last 3 months, Fall 2017 and she is doing well, with prn medication.Advised to take Tylenol daily. Paresthesia on leg likely from neuralgia parestheticaon left leg.She also has calf pain secondary to neurogenic claudications , she asked for handicap forms to be filled out but explained that her symptoms are not severe enough for me to fill the forms out.   RLS: She has not been taking Requip, occasionally has symptoms. Seen by neurologist in the past . States still wakes with leg cramps occasionally, better than it used to be   Atopic Dermatitis: doing much better on methotrexate continues to see [facility] dermatologist. Taking folic acid.Hair loss is stable. Explained may be secondary to methotrexate.Unchanged   Fatigue: She was seen in our office on 12/05/2016 because three days prior she developed fatigue, aching legs, and it got progressively worse ( heavy and tight) She went to church the following morning with very low energy, lack of appetite followed by she noticed some nausea and tightness between shoulder blades. She quit smoking because she got scared, but is smoking again now, however just about 4 cigarettes per day .We checked labs and showed very high c-reactive protein, she denies joint pains or swelling, but has joint stiffness that  lasts about 15 minutes in the mornings, all labs were normal and crp improved, fatigue is back to baseline . She had a negative myoview done by Dr. Clayborn Bigness 05/2017. She denies synovitis, CRP is still elevated, sed rate is back to normal, still has stiffness in am's worse on hips, but shoulders are not painful. She does not want to see Rheumatologist at this time and wants to wait to repeat labs until next visit.   Patient Active Problem List   Diagnosis Date Noted  .  Atherosclerosis of aorta ([place]) 05/04/2017  . Pap smear abnormality of cervix with ASCUS favoring benign 04/23/2017  . Vitamin D deficiency 12/14/2016  . Obesity (BMI 30.0-34.9) 12/13/2015  . Chronic bronchitis ([place]) 05/14/2015  . DM type 2 with diabetic peripheral neuropathy ([place]) 05/14/2015  . Positive H. pylori test 04/05/2015  . Rapid urease test for Helicobacter pylori infection positive 04/05/2015  . Umbilical hernia 76/72/0947  . Exomphalos 04/01/2015  . RLS (restless legs syndrome) 01/08/2015  . Type 2 diabetes mellitus with other diabetic kidney complication ([place]) 09/62/8366  . Neurogenic claudication 01/08/2015  . AD (atopic dermatitis) 12/28/2014  . Chronic LBP 12/28/2014  . Dyslipidemia 12/28/2014  . Gastroesophageal reflux disease without esophagitis 12/28/2014  . Microalbuminuria 12/28/2014  . Disorder of bone and cartilage 12/28/2014  . Neuralgia of left thigh 12/28/2014  . Perennial allergic rhinitis with seasonal variation 12/28/2014  . Tobacco abuse 12/28/2014  . Hypothyroidism 09/22/2005  . Hypertension, benign 09/22/2005    Past Surgical History:  Procedure Laterality Date  . CATARACT EXTRACTION Bilateral 1980  . CESAREAN SECTION    . CORNEAL TRANSPLANT Left    [facility]-Treatment for blindness.    Family History  Problem Relation Age of Onset  . Diabetes Mother   . Hypertension Mother   . Pancreatic cancer Mother   . Alcohol abuse Father   . Arthritis/Rheumatoid Sister   . Diabetes Sister   . Kidney disease Sister   . Diabetes Brother   . Breast cancer Neg Hx     Social History   Socioeconomic History  . Marital status: Single    Spouse name: Not on file  . Number of children: 3  . Years of education: Not on file  . Highest education level: Not on file  Occupational History  . Not on file  Social Needs  . Financial resource strain: Somewhat hard  . Food insecurity:    Worry: Never true    Inability: Sometimes true  . Transportation needs:     Medical: No    Non-medical: No  Tobacco Use  . Smoking status: Current Every Day Smoker    Packs/day: 0.25    Years: 46.00    Pack years: 11.50    Types: Cigarettes    Start date: 01/07/1985  . Smokeless tobacco: Never Used  . Tobacco comment: trying to quit  -used to smoke heavily in the past   Substance and Sexual Activity  . Alcohol use: No    Alcohol/week: 0.0 standard drinks  . Drug use: No  . Sexual activity: Never  Lifestyle  . Physical activity:    Days per week: 0 days    Minutes per session: 0 min  . Stress: Not at all  Relationships  . Social connections:    Talks on phone: More than three times a week    Gets together: More than three times a week    Attends religious service: More than 4 times per year    Active  member of club or organization: Yes    Attends meetings of clubs or organizations: More than 4 times per year    Relationship status: Never married  . Intimate partner violence:    Fear of current or ex partner: No    Emotionally abused: No    Physically abused: No    Forced sexual activity: No  Other Topics Concern  . Not on file  Social History Narrative   Disabled from severe atopic dermatitis   Has 3 children   Lives alone but very connect with family ( sees sister and her children daily)   Also belongs to a church     Current Outpatient Medications:  .  albuterol (PROVENTIL HFA;VENTOLIN HFA) 108 (90 Base) MCG/ACT inhaler, Inhale 2 puffs into the lungs every 6 (six) hours as needed for wheezing or shortness of breath., Disp: 1 Inhaler, Rfl: 0 .  Alcohol Swabs (ALCOHOL PREP) PADS, , Disp: , Rfl:  .  aspirin 81 MG chewable tablet, Chew 1 tablet by mouth daily., Disp: , Rfl:  .  atorvastatin (LIPITOR) 40 MG tablet, Take 1 tablet (40 mg total) by mouth daily. In place of simvastatin, Disp: 90 tablet, Rfl: 1 .  Cholecalciferol (VITAMIN D) 2000 units CAPS, Take 1 capsule (2,000 Units total) by mouth daily., Disp: 30 capsule, Rfl: 12 .   empagliflozin (JARDIANCE) 25 MG TABS tablet, Take 25 mg by mouth daily., Disp: 90 tablet, Rfl: 1 .  folic acid (FOLVITE) 1 MG tablet, Take by mouth., Disp: , Rfl:  .  gabapentin (NEURONTIN) 300 MG capsule, Take 1 capsule (300 mg total) by mouth 3 (three) times daily., Disp: 270 capsule, Rfl: 1 .  glucose blood test strip, , Disp: , Rfl:  .  HYDROcodone-acetaminophen (NORCO/VICODIN) 5-325 MG tablet, Take 2 tablets by mouth 2 (two) times daily as needed for moderate pain., Disp: 30 tablet, Rfl: 0 .  Lancet Devices (SIMPLE DIAGNOSTICS LANCING DEV) MISC, , Disp: , Rfl:  .  lisinopril (PRINIVIL,ZESTRIL) 5 MG tablet, Take 1 tablet (5 mg total) by mouth daily., Disp: 90 tablet, Rfl: 1 .  metFORMIN (GLUCOPHAGE-XR) 750 MG 24 hr tablet, Take 1 tablet (750 mg total) by mouth daily with breakfast., Disp: 90 tablet, Rfl: 1 .  methotrexate (RHEUMATREX) 2.5 MG tablet, Take 15 mg by mouth once a week. , Disp: , Rfl:  .  triamcinolone cream (KENALOG) 0.1 %, TRIAMCINOLONE ACETONIDE, 0.1% (External Cream)  use per dermatologist. for 0 days  Quantity: 1.00;  Refills: 0   Ordered :15-Jun-2010  Steele Sizer MD;  Started 02-Apr-2008 Active Comments: DX: 724.2, Disp: 30 g, Rfl: 3 .  umeclidinium-vilanterol (ANORO ELLIPTA) 62.5-25 MCG/INH AEPB, Inhale 1 puff into the lungs daily., Disp: 180 each, Rfl: 2 .  Vitamin D, Ergocalciferol, (DRISDOL) 50000 units CAPS capsule, Take 1 capsule (50,000 Units total) by mouth every 7 (seven) days., Disp: 12 capsule, Rfl: 0 .  Blood Glucose Monitoring Suppl (ONE TOUCH ULTRA MINI) w/Device KIT, 1 each by Does not apply route daily., Disp: 1 each, Rfl: 0  Allergies  Allergen Reactions  . Penicillins Rash     ROS  Constitutional: Negative for fever or weight change.  Respiratory: positive  For intermittent  cough but no shortness of breath.   Cardiovascular: Negative for chest pain or palpitations.  Gastrointestinal: Negative for abdominal pain, no bowel changes.   Musculoskeletal: Positive for intermittent  gait problem but no  joint swelling.  Skin: Negative for rash.  Neurological: Negative for dizziness or headache.  No other specific complaints in a complete review of systems (except as listed in HPI above).   Objective  Vitals:   01/16/18 1041  BP: 108/62  Pulse: 90  Resp: 16  Temp: 98.1 F (36.7 C)  TempSrc: Oral  SpO2: 95%  Weight: 182 lb 6.4 oz (82.7 kg)  Height: 4' 10"  (1.473 m)    Body mass index is 38.12 kg/m.  Physical Exam  Constitutional: Patient appears well-developed and well-nourished. Obese  No distress.  HEENT: head atraumatic, normocephalic, pupils equal and reactive to light,  neck supple, throat within normal limits Cardiovascular: Normal rate, regular rhythm and normal heart sounds.  No murmur heard. No BLE edema. Pulmonary/Chest: Effort normal and breath sounds normal. No respiratory distress. Abdominal: Soft.  There is no tenderness. Muscular Skeletal: no tenderness during palpation of lumbar spine, negative straight leg raise Psychiatric: Patient has a normal mood and affect. behavior is normal. Judgment and thought content normal.  Recent Results (from the past 2160 hour(s))  POCT HgB A1C     Status: Abnormal   Collection Time: 10/19/17  9:21 AM  Result Value Ref Range   Hemoglobin A1C 7.1   CBC with Differential/Platelet     Status: None   Collection Time: 10/19/17  9:49 AM  Result Value Ref Range   WBC 7.9 3.8 - 10.8 Thousand/uL   RBC 4.59 3.80 - 5.10 Million/uL   Hemoglobin 14.3 11.7 - 15.5 g/dL   HCT 42.0 35.0 - 45.0 %   MCV 91.5 80.0 - 100.0 fL   MCH 31.2 27.0 - 33.0 pg   MCHC 34.0 32.0 - 36.0 g/dL   RDW 13.3 11.0 - 15.0 %   Platelets 264 140 - 400 Thousand/uL   MPV 11.5 7.5 - 12.5 fL   Neutro Abs 4,432 1,500 - 7,800 cells/uL   Lymphs Abs 2,718 850 - 3,900 cells/uL   WBC mixed population 466 200 - 950 cells/uL   Eosinophils Absolute 253 15 - 500 cells/uL   Basophils Absolute 32 0 - 200  cells/uL   Neutrophils Relative % 56.1 %   Total Lymphocyte 34.4 %   Monocytes Relative 5.9 %   Eosinophils Relative 3.2 %   Basophils Relative 0.4 %  COMPLETE METABOLIC PANEL WITH GFR     Status: Abnormal   Collection Time: 10/19/17  9:49 AM  Result Value Ref Range   Glucose, Bld 107 (H) 65 - 99 mg/dL    Comment: .            Fasting reference interval . For someone without known diabetes, a glucose value between 100 and 125 mg/dL is consistent with prediabetes and should be confirmed with a follow-up test. .    BUN 11 7 - 25 mg/dL   Creat 0.62 0.50 - 0.99 mg/dL    Comment: For patients >17 years of age, the reference limit for Creatinine is approximately 13% higher for people identified as African-American. .    GFR, Est Non African American 96 > OR = 60 mL/min/1.26m   GFR, Est African American 111 > OR = 60 mL/min/1.79m  BUN/Creatinine Ratio NOT APPLICABLE 6 - 22 (calc)   Sodium 140 135 - 146 mmol/L   Potassium 4.3 3.5 - 5.3 mmol/L   Chloride 107 98 - 110 mmol/L   CO2 25 20 - 32 mmol/L   Calcium 9.3 8.6 - 10.4 mg/dL   Total Protein 7.1 6.1 - 8.1 g/dL   Albumin 3.9 3.6 - 5.1 g/dL   Globulin  3.2 1.9 - 3.7 g/dL (calc)   AG Ratio 1.2 1.0 - 2.5 (calc)   Total Bilirubin 0.2 0.2 - 1.2 mg/dL   Alkaline phosphatase (APISO) 90 33 - 130 U/L   AST 12 10 - 35 U/L   ALT 10 6 - 29 U/L  Lipid panel     Status: None   Collection Time: 10/19/17  9:49 AM  Result Value Ref Range   Cholesterol 130 <200 mg/dL   HDL 52 >50 mg/dL   Triglycerides 70 <150 mg/dL   LDL Cholesterol (Calc) 63 mg/dL (calc)    Comment: Reference range: <100 . Desirable range <100 mg/dL for primary prevention;   <70 mg/dL for patients with CHD or diabetic patients  with > or = 2 CHD risk factors. Marland Kitchen LDL-C is now calculated using the Martin-Hopkins  calculation, which is a validated novel method providing  better accuracy than the Friedewald equation in the  estimation of LDL-C.  Cresenciano Genre et al. Annamaria Helling.  6144;315(40): 2061-2068  (http://education.[facility].com/faq/FAQ164)    Total CHOL/HDL Ratio 2.5 <5.0 (calc)   Non-HDL Cholesterol (Calc) 78 <130 mg/dL (calc)    Comment: For patients with diabetes plus 1 major ASCVD risk  factor, treating to a non-HDL-C goal of <100 mg/dL  (LDL-C of <70 mg/dL) is considered a therapeutic  option.   VITAMIN D 25 Hydroxy (Vit-D Deficiency, Fractures)     Status: Abnormal   Collection Time: 10/19/17  9:49 AM  Result Value Ref Range   Vit D, 25-Hydroxy 15 (L) 30 - 100 ng/mL    Comment: Vitamin D Status         25-OH Vitamin D: . Deficiency:                    <20 ng/mL Insufficiency:             20 - 29 ng/mL Optimal:                 > or = 30 ng/mL . For 25-OH Vitamin D testing on patients on  D2-supplementation and patients for whom quantitation  of D2 and D3 fractions is required, the QuestAssureD(TM) 25-OH VIT D, (D2,D3), LC/MS/MS is recommended: order  code [phone] (patients >89yr). . For more information on this test, go to: http://education.[facility].com/faq/FAQ163 (This link is being provided for  informational/educational purposes only.)   Sedimentation rate     Status: None   Collection Time: 10/19/17  9:49 AM  Result Value Ref Range   Sed Rate 19 0 - 30 mm/h  C-reactive protein     Status: Abnormal   Collection Time: 10/19/17  9:49 AM  Result Value Ref Range   CRP 19.7 (H) <8.0 mg/L  CRP High sensitivity     Status: Abnormal   Collection Time: 10/19/17  9:53 AM  Result Value Ref Range   hs-CRP >10.0 (H) mg/L    Comment: Reference Range Optimal <1.0 Jellinger PS et al. EPeterson LombardPract.2017;23(Suppl 2):1-87. . For ages >>46Years: hs-CRP mg/L  Risk According to AHA/CDC Guidelines <1.0         Lower relative cardiovascular risk. 1.0-3.0      Average relative cardiovascular risk. 3.1-10.0     Higher relative cardiovascular risk.              Consider retesting in 1 to 2 weeks to              exclude a benign transient  elevation  in the baseline CRP value secondary              to infection or inflammation. >10.0        Persistent elevation, upon retesting,              may be associated with infection and              inflammation. Marland Kitchen   POCT glycosylated hemoglobin (Hb A1C)     Status: Abnormal   Collection Time: 01/16/18 10:56 AM  Result Value Ref Range   Hemoglobin A1C     HbA1c POC (<> result, manual entry)     HbA1c, POC (prediabetic range) 6.5 (A) 5.7 - 6.4 %   HbA1c, POC (controlled diabetic range)    POCT UA - Microalbumin     Status: Normal   Collection Time: 01/16/18 10:57 AM  Result Value Ref Range   Microalbumin Ur, POC negative mg/L   Creatinine, POC     Albumin/Creatinine Ratio, Urine, POC       PHQ2/9: Depression screen [facility] 2/9 01/16/2018 10/19/2017 04/18/2017 03/16/2017 12/05/2016  Decreased Interest 0 0 0 0 0  Down, Depressed, Hopeless 0 0 0 0 0  PHQ - 2 Score 0 0 0 0 0  Altered sleeping 0 - - - -  Tired, decreased energy 0 - - - -  Change in appetite 0 - - - -  Feeling bad or failure about yourself  0 - - - -  Trouble concentrating 0 - - - -  Moving slowly or fidgety/restless 0 - - - -  Suicidal thoughts 0 - - - -  PHQ-9 Score 0 - - - -  Difficult doing work/chores Not difficult at all - - - -     Fall Risk: Fall Risk  01/16/2018 10/19/2017 06/20/2017 04/18/2017 03/16/2017  Falls in the past year? Yes No No No No  Number falls in past yr: 1 - - - -  Injury with Fall? No - - - -  Risk Factor Category  - - - - -  Follow up - - - - -    Functional Status Survey: Is the patient deaf or have difficulty hearing?: No Does the patient have difficulty seeing, even when wearing glasses/contacts?: No Does the patient have difficulty concentrating, remembering, or making decisions?: No Does the patient have difficulty walking or climbing stairs?: Yes Does the patient have difficulty dressing or bathing?: No Does the patient have difficulty doing errands alone such  as visiting a doctor's office or shopping?: No   Assessment & Plan  1. Type 2 diabetes mellitus with other diabetic kidney complication (HCC)  - POCT glycosylated hemoglobin (Hb A1C) - POCT UA - Microalbumin - Blood Glucose Monitoring Suppl (ONE TOUCH ULTRA MINI) w/Device KIT; 1 each by Does not apply route daily.  Dispense: 1 each; Refill: 0  2. DM type 2 with diabetic peripheral neuropathy (HCC)  - empagliflozin (JARDIANCE) 25 MG TABS tablet; Take 25 mg by mouth daily.  Dispense: 90 tablet; Refill: 1 - metFORMIN (GLUCOPHAGE-XR) 750 MG 24 hr tablet; Take 1 tablet (750 mg total) by mouth daily with breakfast.  Dispense: 90 tablet; Refill: 1 - Blood Glucose Monitoring Suppl (ONE TOUCH ULTRA MINI) w/Device KIT; 1 each by Does not apply route daily.  Dispense: 1 each; Refill: 0  3. Type 2 diabetes mellitus with other diabetic kidney complication, without [place]-term current use of insulin (HCC)  - empagliflozin (JARDIANCE) 25 MG TABS tablet; Take 25  mg by mouth daily.  Dispense: 90 tablet; Refill: 1 - lisinopril (PRINIVIL,ZESTRIL) 5 MG tablet; Take 1 tablet (5 mg total) by mouth daily.  Dispense: 90 tablet; Refill: 1 - Blood Glucose Monitoring Suppl (ONE TOUCH ULTRA MINI) w/Device KIT; 1 each by Does not apply route daily.  Dispense: 1 each; Refill: 0  4. Neurogenic claudication  - HYDROcodone-acetaminophen (NORCO/VICODIN) 5-325 MG tablet; Take 2 tablets by mouth 2 (two) times daily as needed for moderate pain.  Dispense: 30 tablet; Refill: 0  5. Chronic bilateral low back pain without sciatica  - HYDROcodone-acetaminophen (NORCO/VICODIN) 5-325 MG tablet; Take 2 tablets by mouth 2 (two) times daily as needed for moderate pain.  Dispense: 30 tablet; Refill: 0  6. Microalbuminuria  - lisinopril (PRINIVIL,ZESTRIL) 5 MG tablet; Take 1 tablet (5 mg total) by mouth daily.  Dispense: 90 tablet; Refill: 1  7. Dyslipidemia  - atorvastatin (LIPITOR) 40 MG tablet; Take 1 tablet (40 mg total) by  mouth daily. In place of simvastatin  Dispense: 90 tablet; Refill: 1  8. Type 2 diabetes mellitus with microalbuminuria, without [place]-term current use of insulin (HCC)  - metFORMIN (GLUCOPHAGE-XR) 750 MG 24 hr tablet; Take 1 tablet (750 mg total) by mouth daily with breakfast.  Dispense: 90 tablet; Refill: 1  9. Mucopurulent chronic bronchitis ([place])   10. Elevated C-reactive protein  Recheck next visit   11. Vitamin D deficiency  - Vitamin D, Ergocalciferol, (DRISDOL) 50000 units CAPS capsule; Take 1 capsule (50,000 Units total) by mouth every 7 (seven) days.  Dispense: 12 capsule; Refill: 0

## 2018-02-01 DIAGNOSIS — Z79899 Other long term (current) drug therapy: Secondary | ICD-10-CM | POA: Diagnosis not present

## 2018-02-01 DIAGNOSIS — L2089 Other atopic dermatitis: Secondary | ICD-10-CM | POA: Diagnosis not present

## 2018-02-19 ENCOUNTER — Ambulatory Visit (INDEPENDENT_AMBULATORY_CARE_PROVIDER_SITE_OTHER): Payer: Medicare Other | Admitting: Nurse Practitioner

## 2018-02-19 ENCOUNTER — Encounter: Payer: Self-pay | Admitting: Nurse Practitioner

## 2018-02-19 VITALS — BP 110/70 | HR 95 | Temp 98.0°F | Resp 16 | Ht <= 58 in | Wt 174.7 lb

## 2018-02-19 DIAGNOSIS — R300 Dysuria: Secondary | ICD-10-CM

## 2018-02-19 DIAGNOSIS — E1129 Type 2 diabetes mellitus with other diabetic kidney complication: Secondary | ICD-10-CM | POA: Diagnosis not present

## 2018-02-19 DIAGNOSIS — N3001 Acute cystitis with hematuria: Secondary | ICD-10-CM

## 2018-02-19 DIAGNOSIS — Z23 Encounter for immunization: Secondary | ICD-10-CM | POA: Diagnosis not present

## 2018-02-19 LAB — POCT URINALYSIS DIPSTICK
Appearance: ABNORMAL
Bilirubin, UA: NEGATIVE
GLUCOSE UA: POSITIVE — AB
Ketones, UA: NEGATIVE
LEUKOCYTES UA: NEGATIVE
NITRITE UA: POSITIVE
Odor: ABNORMAL
Protein, UA: POSITIVE — AB
RBC UA: POSITIVE
SPEC GRAV UA: 1.01 (ref 1.010–1.025)
Urobilinogen, UA: 0.2 E.U./dL
pH, UA: 6 (ref 5.0–8.0)

## 2018-02-19 LAB — POCT GLUCOSE (DEVICE FOR HOME USE)
GLUCOSE FASTING, POC: 176 mg/dL — AB (ref 70–99)
POC Glucose: 176 mg/dl — AB (ref 70–99)

## 2018-02-19 MED ORDER — PHENAZOPYRIDINE HCL 95 MG PO TABS: 95.0000 mg | ORAL_TABLET | Freq: Two times a day (BID) | ORAL | 0 refills | Status: DC | PRN

## 2018-02-19 MED ORDER — SULFAMETHOXAZOLE-TRIMETHOPRIM 800-160 MG PO TABS: 1.0000 | ORAL_TABLET | Freq: Two times a day (BID) | ORAL | 0 refills | Status: DC

## 2018-02-19 NOTE — Progress Notes (Signed)
Name: Julie Wall   MRN: [id number]    DOB: April 03, 1956   Date:02/19/2018       Progress Note  Subjective  Chief Complaint  Chief Complaint  Patient presents with  . Dysuria    Painful urination with frequency, urgency and abdominal pain x 1 week.    HPI  UTI Symptoms Symptoms have been ongoing for 4 days.  Patient endorses dysuria, urinary frequency and urgency, suprapubic pain.  Patient denies fevers, chills, flank pain, CVA tenderness, n/v, fatigue, hematuria, vaginal discharge, itching or pain.   States diabetes is usually well controlled, this morning had coffee with cream and sugar.  Takes methotrexate every Friday for atopic dermatitis.     Patient Active Problem List   Diagnosis Date Noted  . Atherosclerosis of aorta ([place]) 05/04/2017  . Pap smear abnormality of cervix with ASCUS favoring benign 04/23/2017  . Vitamin D deficiency 12/14/2016  . Obesity (BMI 30.0-34.9) 12/13/2015  . Chronic bronchitis ([place]) 05/14/2015  . DM type 2 with diabetic peripheral neuropathy ([place]) 05/14/2015  . Positive H. pylori test 04/05/2015  . Rapid urease test for Helicobacter pylori infection positive 04/05/2015  . Umbilical hernia 14/43/1540  . Exomphalos 04/01/2015  . RLS (restless legs syndrome) 01/08/2015  . Type 2 diabetes mellitus with other diabetic kidney complication ([place]) 08/67/6195  . Neurogenic claudication 01/08/2015  . AD (atopic dermatitis) 12/28/2014  . Chronic LBP 12/28/2014  . Dyslipidemia 12/28/2014  . Gastroesophageal reflux disease without esophagitis 12/28/2014  . Microalbuminuria 12/28/2014  . Disorder of bone and cartilage 12/28/2014  . Neuralgia of left thigh 12/28/2014  . Perennial allergic rhinitis with seasonal variation 12/28/2014  . Tobacco abuse 12/28/2014  . Hypothyroidism 09/22/2005  . Hypertension, benign 09/22/2005    Past Medical History:  Diagnosis Date  . Allergy   . ASCUS favor benign 09/2013   negative HPV  . Atopic dermatitis     Dermatologist at [facility]  . Chronic low back pain   . Decreased exercise tolerance   . Dental caries   . Diabetes mellitus without complication ([place])   . GERD (gastroesophageal reflux disease)   . Hypertension   . Lumbosacral neuritis   . Microalbuminuria   . Osteopenia   . Ovarian failure   . Tobacco use     Past Surgical History:  Procedure Laterality Date  . CATARACT EXTRACTION Bilateral 1980  . CESAREAN SECTION    . CORNEAL TRANSPLANT Left    [facility]-Treatment for blindness.    Social History   Tobacco Use  . Smoking status: Current Every Day Smoker    Packs/day: 0.25    Years: 46.00    Pack years: 11.50    Types: Cigarettes    Start date: 01/07/1985  . Smokeless tobacco: Never Used  . Tobacco comment: trying to quit  -used to smoke heavily in the past   Substance Use Topics  . Alcohol use: No    Alcohol/week: 0.0 standard drinks     Current Outpatient Medications:  .  albuterol (PROVENTIL HFA;VENTOLIN HFA) 108 (90 Base) MCG/ACT inhaler, Inhale 2 puffs into the lungs every 6 (six) hours as needed for wheezing or shortness of breath., Disp: 1 Inhaler, Rfl: 0 .  Alcohol Swabs (ALCOHOL PREP) PADS, , Disp: , Rfl:  .  aspirin 81 MG chewable tablet, Chew 1 tablet by mouth daily., Disp: , Rfl:  .  atorvastatin (LIPITOR) 40 MG tablet, Take 1 tablet (40 mg total) by mouth daily. In place of simvastatin, Disp: 90 tablet, Rfl: 1 .  Blood Glucose Monitoring Suppl (ONE TOUCH ULTRA MINI) w/Device KIT, 1 each by Does not apply route daily., Disp: 1 each, Rfl: 0 .  Cholecalciferol (VITAMIN D) 2000 units CAPS, Take 1 capsule (2,000 Units total) by mouth daily., Disp: 30 capsule, Rfl: 12 .  empagliflozin (JARDIANCE) 25 MG TABS tablet, Take 25 mg by mouth daily., Disp: 90 tablet, Rfl: 1 .  folic acid (FOLVITE) 1 MG tablet, Take by mouth., Disp: , Rfl:  .  gabapentin (NEURONTIN) 300 MG capsule, Take 1 capsule (300 mg total) by mouth 3 (three) times daily., Disp: 270 capsule, Rfl: 1 .  glucose  blood test strip, , Disp: , Rfl:  .  HYDROcodone-acetaminophen (NORCO/VICODIN) 5-325 MG tablet, Take 2 tablets by mouth 2 (two) times daily as needed for moderate pain., Disp: 30 tablet, Rfl: 0 .  Lancet Devices (SIMPLE DIAGNOSTICS LANCING DEV) MISC, , Disp: , Rfl:  .  lisinopril (PRINIVIL,ZESTRIL) 5 MG tablet, Take 1 tablet (5 mg total) by mouth daily., Disp: 90 tablet, Rfl: 1 .  metFORMIN (GLUCOPHAGE-XR) 750 MG 24 hr tablet, Take 1 tablet (750 mg total) by mouth daily with breakfast., Disp: 90 tablet, Rfl: 1 .  methotrexate (RHEUMATREX) 2.5 MG tablet, Take 15 mg by mouth once a week. , Disp: , Rfl:  .  triamcinolone cream (KENALOG) 0.1 %, TRIAMCINOLONE ACETONIDE, 0.1% (External Cream)  use per dermatologist. for 0 days  Quantity: 1.00;  Refills: 0   Ordered :15-Jun-2010  Steele Sizer MD;  Started 02-Apr-2008 Active Comments: DX: 724.2, Disp: 30 g, Rfl: 3 .  umeclidinium-vilanterol (ANORO ELLIPTA) 62.5-25 MCG/INH AEPB, Inhale 1 puff into the lungs daily., Disp: 180 each, Rfl: 2 .  Vitamin D, Ergocalciferol, (DRISDOL) 50000 units CAPS capsule, Take 1 capsule (50,000 Units total) by mouth every 7 (seven) days., Disp: 12 capsule, Rfl: 0  Allergies  Allergen Reactions  . Penicillins Rash    ROS   No other specific complaints in a complete review of systems (except as listed in HPI above).  Objective  Vitals:   02/19/18 0907  BP: 110/70  Pulse: 95  Resp: 16  Temp: 98 F (36.7 C)  TempSrc: Oral  SpO2: 96%  Weight: 174 lb 11.2 oz (79.2 kg)  Height: _0  (1.473 m)     Body mass index is 36.51 kg/m.  Nursing Note and Vital Signs reviewed.  Physical Exam  Constitutional: She is oriented to person, place, and time. She appears well-developed and well-nourished. She is cooperative.  HENT:  Head: Normocephalic.  Right Ear: Hearing normal.  Left Ear: Hearing normal.  Nose: Nose normal.  Mouth/Throat: Oropharynx is clear and moist and mucous membranes are normal.   Cardiovascular: Normal rate, regular rhythm and normal heart sounds.  No lower extremity edema noted.  Pulmonary/Chest: Effort normal and breath sounds normal.  Abdominal: Soft. Normal appearance and bowel sounds are normal. There is tenderness (mild suprapubic pressure, with palpation, not painful).  No CVA tenderness   Neurological: She is alert and oriented to person, place, and time.  Psychiatric: She has a normal mood and affect. Her speech is normal and behavior is normal. Thought content normal.      No results found for this or any previous visit (from the past 48 hour(s)).  Assessment & Plan  1. Dysuria - POCT Urinalysis Dipstick - phenazopyridine (PYRIDIUM) 95 MG tablet; Take 1 tablet (95 mg total) by mouth 2 (two) times daily as needed for pain.  Dispense: 10 tablet; Refill: 0  2. Flu vaccine  need - Flu Vaccine QUAD 6+ mos PF IM (Fluarix Quad PF)  3. Acute cystitis with hematuria - Stop methotrexate dose for Friday- patient to confirm with derm; hold jardiance while on antibiotic and restart when complete - sulfamethoxazole-trimethoprim (BACTRIM DS,SEPTRA DS) 800-160 MG tablet; Take 1 tablet by mouth 2 (two) times daily.  Dispense: 10 tablet; Refill: 0 - phenazopyridine (PYRIDIUM) 95 MG tablet; Take 1 tablet (95 mg total) by mouth 2 (two) times daily as needed for pain.  Dispense: 10 tablet; Refill: 0  4. Type 2 diabetes mellitus with other diabetic kidney complication (HCC) Work on diet, drink plenty of water.  - POCT Glucose (Device for Home Use)   - return in 2 weeks for repeat urine, discussed ER precautions.

## 2018-02-19 NOTE — Patient Instructions (Addendum)
-   Antibiotic (bactrim) takes twice a day with food on your stomach for five days; even when your symptoms improve take the entire course.  - Contact dermatologist to make sure it is okay for you to not take your Friday dose this week and re-start next week. If not please contact us.  - The bladder pain medicine (Pyridium) can make your pee orange, do not be alarmed. Do not take this medicine for more than three days. Only take this one is needed.  - Come back in 2 weeks to retest your urine, come back sooner if your symptoms are still there. - Go to urgent care or the ER if you have fevers, nausea, severe abdominal pain, or vomiting.  ____________________  Your symptoms should begin to improve within a day of starting antibiotics. But you should finish all the antibiotic pills you get to ensure the bacteria is killed and prevent antibiotic resistance.  If you are having pain when you pee, you can also take a medicine to numb your bladder. Look for Phenazopyridine (Pyridium or AZO) over the counter at your pharmacy. This medicine eases the pain caused by urinary tract infections. It also reduces the need to urinate.  If you frequently get UTI's here are some prevention tips:  - Avoiding spermicides  - Drinking more fluid - This can help prevent bladder infections. ?Urinating right after sex - Some doctors think this helps, because it helps flush out germs that might get into the bladder during sex. There is no proof it works, but it also cannot hurt. ?Vaginal estrogen - If you are a woman who has already been through menopause, your doctor might suggest this. Vaginal estrogen comes in a cream or a flexible ring that you put into your vagina. It can help prevent bladder infections. ?Antibiotics - If you get a lot of bladder infections, and the above methods have not helped, your doctor might give you antibiotics to help prevent infection. But taking antibiotics has downsides, so doctors usually suggest  trying other things first   The studies suggesting that cranberry products prevent bladder infections are not very good. Other studies suggest that cranberry products do not prevent bladder infections. But if you want to try cranberry products for this purpose, there is probably not much harm in doing so.

## 2018-03-05 ENCOUNTER — Ambulatory Visit: Payer: Medicare Other

## 2018-03-05 ENCOUNTER — Other Ambulatory Visit (INDEPENDENT_AMBULATORY_CARE_PROVIDER_SITE_OTHER): Payer: Medicare Other | Admitting: Nurse Practitioner

## 2018-03-05 DIAGNOSIS — R808 Other proteinuria: Secondary | ICD-10-CM

## 2018-03-05 DIAGNOSIS — N3090 Cystitis, unspecified without hematuria: Secondary | ICD-10-CM | POA: Diagnosis not present

## 2018-03-05 LAB — POCT URINALYSIS DIPSTICK
Bilirubin, UA: NEGATIVE
Glucose, UA: POSITIVE — AB
Ketones, UA: NEGATIVE
NITRITE UA: NEGATIVE
PH UA: 6 (ref 5.0–8.0)
Protein, UA: POSITIVE — AB
SPEC GRAV UA: 1.015 (ref 1.010–1.025)
Urobilinogen, UA: 0.2 E.U./dL

## 2018-03-07 ENCOUNTER — Other Ambulatory Visit: Payer: Self-pay | Admitting: Nurse Practitioner

## 2018-03-07 DIAGNOSIS — N3001 Acute cystitis with hematuria: Secondary | ICD-10-CM

## 2018-03-07 LAB — BASIC METABOLIC PANEL
BUN: 11 mg/dL (ref 7–25)
CALCIUM: 9 mg/dL (ref 8.6–10.4)
CHLORIDE: 107 mmol/L (ref 98–110)
CO2: 27 mmol/L (ref 20–32)
CREATININE: 0.62 mg/dL (ref 0.50–0.99)
Glucose, Bld: 144 mg/dL — ABNORMAL HIGH (ref 65–99)
POTASSIUM: 4.1 mmol/L (ref 3.5–5.3)
Sodium: 140 mmol/L (ref 135–146)

## 2018-03-07 LAB — URINE CULTURE
MICRO NUMBER:: 91177557
SPECIMEN QUALITY: ADEQUATE

## 2018-03-07 LAB — MICROALBUMIN / CREATININE URINE RATIO
CREATININE, URINE: 105 mg/dL (ref 20–275)
Microalb Creat Ratio: 216 mcg/mg creat — ABNORMAL HIGH (ref <30)
Microalb, Ur: 22.7 mg/dL

## 2018-03-07 MED ORDER — SULFAMETHOXAZOLE-TRIMETHOPRIM 800-160 MG PO TABS: 1.0000 | ORAL_TABLET | Freq: Two times a day (BID) | ORAL | 0 refills | Status: DC

## 2018-03-11 DIAGNOSIS — E119 Type 2 diabetes mellitus without complications: Secondary | ICD-10-CM | POA: Diagnosis not present

## 2018-03-21 ENCOUNTER — Encounter: Payer: Self-pay | Admitting: Nurse Practitioner

## 2018-03-21 ENCOUNTER — Ambulatory Visit (INDEPENDENT_AMBULATORY_CARE_PROVIDER_SITE_OTHER): Payer: Medicare Other | Admitting: Nurse Practitioner

## 2018-03-21 VITALS — BP 104/64 | HR 88 | Temp 98.2°F | Resp 16 | Ht <= 58 in | Wt 174.4 lb

## 2018-03-21 DIAGNOSIS — N3001 Acute cystitis with hematuria: Secondary | ICD-10-CM

## 2018-03-21 DIAGNOSIS — E1329 Other specified diabetes mellitus with other diabetic kidney complication: Secondary | ICD-10-CM

## 2018-03-21 DIAGNOSIS — R809 Proteinuria, unspecified: Secondary | ICD-10-CM | POA: Diagnosis not present

## 2018-03-21 DIAGNOSIS — Z716 Tobacco abuse counseling: Secondary | ICD-10-CM

## 2018-03-21 LAB — POCT URINALYSIS DIPSTICK
Appearance: NORMAL
Bilirubin, UA: NEGATIVE
Glucose, UA: POSITIVE — AB
KETONES UA: NEGATIVE
LEUKOCYTES UA: NEGATIVE
Nitrite, UA: NEGATIVE
ODOR: NORMAL
Protein, UA: NEGATIVE
Spec Grav, UA: 1.015 (ref 1.010–1.025)
Urobilinogen, UA: 0.2 E.U./dL
pH, UA: 6 (ref 5.0–8.0)

## 2018-03-21 NOTE — Patient Instructions (Addendum)
-  Great job at drinking plenty of water and cutting out caffeine. You can add one cup of caffeine (coffee or tea) a day to your diet.    Kidneys are vital to your overall health, so it's important to look after them. Five simple lifestyle steps can help you keep them in good shape. Stay hydrated Drinking plenty of fluid will help your kidneys function properly. Your urine should be straw-coloured. If it's any darker it may be a sign of dehydration. A general goal is to drink at least 64 ounces or 8 glasses of water a day.  During hot weather, when travelling in hot countries or when exercising strenuously, you need to drink more water than usual to make up for the fluid lost by sweating. Eat healthily A balanced diet ensures you get all the vitamins and minerals your body needs. Eat plenty of fruit and vegetables, and grains such as wholewheat pasta, bread and rice. Don't eat too much salty or fatty food.  Watch your blood pressure Have your blood pressure checked regularly. High blood pressure has no symptoms, but it can increase your risk of kidney and heart problems.An ideal blood pressure is considered to be between 90/59mmHg and 120/29mmHg.  Don't smoke or drink too much alcohol Drinking too much alcohol and smoking both raise your blood pressure. High blood pressure is one of the most common causes of kidney disease. Keep slim to help your kidneys Being too heavy raises your blood pressure, which is bad for your kidneys. Try to keep yourself at a healthy weight by keeping active and not overeating.

## 2018-03-21 NOTE — Progress Notes (Signed)
Name: Julie Wall   MRN: [id number]    DOB: 09-Oct-1955   Date:03/21/2018       Progress Note  Subjective  Chief Complaint  Chief Complaint  Patient presents with  . Urinary Tract Infection    denies having symptoms    HPI  Patient was seen one month ago for UTI treated initially with bactrim for 5 days; urine additionally showed glucose and protein. Patient symptoms improved mildly after treatment but returned and repeat UA showed leukocytes, protein and glucose still present. Patient takes methotrexate for atopic dermatitis discussed stopping it temporarily while on treatment. Patient additionally takes Jardiance- consider stopping due to frequent UTIs, requested she stop it while on treatment and monitor sugars. Elevated urine microalbumin plan to repeat in 6 months and refer to nephrology as needed. She is presently taking an ACEi- lisinopril for nephroprotection.  She did not stop taking her Jardiance but UTI symptoms have resolved. States has drinking plenty of water, cut out sweet tea. She checks fasting blood sugars once a week states last two weeks have been under 130's. Doesn't like eating breakfast so she usually skips it and snack in the day and eats a bigger dinner before bedtime.   Wt Readings from Last 3 Encounters:  03/21/18 174 lb 6.4 oz (79.1 kg)  02/19/18 174 lb 11.2 oz (79.2 kg)  01/16/18 182 lb 6.4 oz (82.7 kg)     Patient Active Problem List   Diagnosis Date Noted  . Atherosclerosis of aorta ([place]) 05/04/2017  . Pap smear abnormality of cervix with ASCUS favoring benign 04/23/2017  . Vitamin D deficiency 12/14/2016  . Obesity (BMI 30.0-34.9) 12/13/2015  . Chronic bronchitis ([place]) 05/14/2015  . DM type 2 with diabetic peripheral neuropathy ([place]) 05/14/2015  . Positive H. pylori test 04/05/2015  . Rapid urease test for Helicobacter pylori infection positive 04/05/2015  . Umbilical hernia 57/06/7791  . Exomphalos 04/01/2015  . RLS (restless legs syndrome)  01/08/2015  . Type 2 diabetes mellitus with other diabetic kidney complication ([place]) 90/30/0923  . Neurogenic claudication 01/08/2015  . AD (atopic dermatitis) 12/28/2014  . Chronic LBP 12/28/2014  . Dyslipidemia 12/28/2014  . Gastroesophageal reflux disease without esophagitis 12/28/2014  . Microalbuminuria 12/28/2014  . Disorder of bone and cartilage 12/28/2014  . Neuralgia of left thigh 12/28/2014  . Perennial allergic rhinitis with seasonal variation 12/28/2014  . Tobacco abuse 12/28/2014  . Hypothyroidism 09/22/2005  . Hypertension, benign 09/22/2005    Past Medical History:  Diagnosis Date  . Allergy   . ASCUS favor benign 09/2013   negative HPV  . Atopic dermatitis    Dermatologist at [facility]  . Chronic low back pain   . Decreased exercise tolerance   . Dental caries   . Diabetes mellitus without complication ([place])   . GERD (gastroesophageal reflux disease)   . Hypertension   . Lumbosacral neuritis   . Microalbuminuria   . Osteopenia   . Ovarian failure   . Tobacco use     Past Surgical History:  Procedure Laterality Date  . CATARACT EXTRACTION Bilateral 1980  . CESAREAN SECTION    . CORNEAL TRANSPLANT Left    [facility]-Treatment for blindness.    Social History   Tobacco Use  . Smoking status: Current Every Day Smoker    Packs/day: 0.25    Years: 46.00    Pack years: 11.50    Types: Cigarettes    Start date: 01/07/1985  . Smokeless tobacco: Never Used  . Tobacco comment: trying to  quit  -used to smoke heavily in the past   Substance Use Topics  . Alcohol use: No    Alcohol/week: 0.0 standard drinks     Current Outpatient Medications:  .  albuterol (PROVENTIL HFA;VENTOLIN HFA) 108 (90 Base) MCG/ACT inhaler, Inhale 2 puffs into the lungs every 6 (six) hours as needed for wheezing or shortness of breath., Disp: 1 Inhaler, Rfl: 0 .  Alcohol Swabs (ALCOHOL PREP) PADS, , Disp: , Rfl:  .  aspirin 81 MG chewable tablet, Chew 1 tablet by mouth daily., Disp: , Rfl:   .  atorvastatin (LIPITOR) 40 MG tablet, Take 1 tablet (40 mg total) by mouth daily. In place of simvastatin, Disp: 90 tablet, Rfl: 1 .  Blood Glucose Monitoring Suppl (ONE TOUCH ULTRA MINI) w/Device KIT, 1 each by Does not apply route daily., Disp: 1 each, Rfl: 0 .  Cholecalciferol (VITAMIN D) 2000 units CAPS, Take 1 capsule (2,000 Units total) by mouth daily., Disp: 30 capsule, Rfl: 12 .  empagliflozin (JARDIANCE) 25 MG TABS tablet, Take 25 mg by mouth daily., Disp: 90 tablet, Rfl: 1 .  folic acid (FOLVITE) 1 MG tablet, Take by mouth., Disp: , Rfl:  .  gabapentin (NEURONTIN) 300 MG capsule, Take 1 capsule (300 mg total) by mouth 3 (three) times daily., Disp: 270 capsule, Rfl: 1 .  glucose blood test strip, , Disp: , Rfl:  .  HYDROcodone-acetaminophen (NORCO/VICODIN) 5-325 MG tablet, Take 2 tablets by mouth 2 (two) times daily as needed for moderate pain., Disp: 30 tablet, Rfl: 0 .  Lancet Devices (SIMPLE DIAGNOSTICS LANCING DEV) MISC, , Disp: , Rfl:  .  lisinopril (PRINIVIL,ZESTRIL) 5 MG tablet, Take 1 tablet (5 mg total) by mouth daily., Disp: 90 tablet, Rfl: 1 .  metFORMIN (GLUCOPHAGE-XR) 750 MG 24 hr tablet, Take 1 tablet (750 mg total) by mouth daily with breakfast., Disp: 90 tablet, Rfl: 1 .  methotrexate (RHEUMATREX) 2.5 MG tablet, Take 15 mg by mouth once a week. , Disp: , Rfl:  .  phenazopyridine (PYRIDIUM) 95 MG tablet, Take 1 tablet (95 mg total) by mouth 2 (two) times daily as needed for pain., Disp: 10 tablet, Rfl: 0 .  sulfamethoxazole-trimethoprim (BACTRIM DS,SEPTRA DS) 800-160 MG tablet, Take 1 tablet by mouth 2 (two) times daily., Disp: 14 tablet, Rfl: 0 .  triamcinolone cream (KENALOG) 0.1 %, TRIAMCINOLONE ACETONIDE, 0.1% (External Cream)  use per dermatologist. for 0 days  Quantity: 1.00;  Refills: 0   Ordered :15-Jun-2010  Steele Sizer MD;  Started 02-Apr-2008 Active Comments: DX: 724.2, Disp: 30 g, Rfl: 3 .  umeclidinium-vilanterol (ANORO ELLIPTA) 62.5-25 MCG/INH AEPB, Inhale  1 puff into the lungs daily., Disp: 180 each, Rfl: 2  Allergies  Allergen Reactions  . Penicillins Rash    ROS    No other specific complaints in a complete review of systems (except as listed in HPI above).  Objective  Vitals:   03/21/18 0845 03/21/18 0915  BP: 104/64   Pulse: (!) 103 88  Resp: 16 16  Temp: 98.2 F (36.8 C)   TempSrc: Oral   SpO2:  98%  Weight: 174 lb 6.4 oz (79.1 kg)   Height: 4' 10"  (1.473 m)      Body mass index is 36.45 kg/m.  Nursing Note and Vital Signs reviewed.  Physical Exam  Constitutional: She is oriented to person, place, and time. She appears well-developed and well-nourished.  HENT:  Head: Normocephalic and atraumatic.  Cardiovascular: Normal rate, regular rhythm, normal heart sounds and  intact distal pulses.  Pulmonary/Chest: Effort normal and breath sounds normal.  Neurological: She is alert and oriented to person, place, and time. Coordination normal.  Skin: Skin is warm and dry. She is not diaphoretic. No erythema.  Psychiatric: She has a normal mood and affect. Her behavior is normal. Judgment and thought content normal.      Results for orders placed or performed in visit on 03/21/18 (from the past 48 hour(s))  POCT Urinalysis Dipstick     Status: Abnormal   Collection Time: 03/21/18  9:00 AM  Result Value Ref Range   Color, UA yellow    Clarity, UA cloudy    Glucose, UA Positive (A) Negative   Bilirubin, UA negative    Ketones, UA negative    Spec Grav, UA 1.015 1.010 - 1.025   Blood, UA trace    pH, UA 6.0 5.0 - 8.0   Protein, UA Negative Negative   Urobilinogen, UA 0.2 0.2 or 1.0 E.U./dL   Nitrite, UA Negative    Leukocytes, UA Negative Negative   Appearance normal    Odor normal     Assessment & Plan  1. Acute cystitis with hematuria resolved - POCT Urinalysis Dipstick  2. Controlled diabetes mellitus of other type with microalbuminuria, without [place]-term current use of insulin ([place]) Appears diabetes  is well controlled per fasting sugars, continue current regimen. Consider coming off Jardiance if having frequent UTIs. Continue lisinopril for nephroprotection.   3. Proteinuria, unspecified type Cleared; still plan to recheck microalbumin in 5 months.   4. Morbid obesity ([place]) Encouraged with water intake, cutting out sweet drinks and caffeine. Patient requesting to add coffee back- discussed limiting to one caffeine drink a day with low sugar. Discussed eating breakfast even small breakfast to give energy for the day and try eating smaller dinner. Continue and slowly increase diet.   5. Tobacco abuse counseling Was smoking 2 ppd and now just doing 5 cigarettes a day. Plan to just by one box a week and ration throughout the week, then continue to cut down. Spent 3+ minutes in discussion.   Face-to-face time with patient was more than 25 minutes, >50% time spent counseling and coordination of care

## 2018-03-28 DIAGNOSIS — H401131 Primary open-angle glaucoma, bilateral, mild stage: Secondary | ICD-10-CM | POA: Diagnosis not present

## 2018-04-19 ENCOUNTER — Ambulatory Visit (INDEPENDENT_AMBULATORY_CARE_PROVIDER_SITE_OTHER): Payer: Medicare Other | Admitting: Family Medicine

## 2018-04-19 ENCOUNTER — Encounter: Payer: Self-pay | Admitting: Family Medicine

## 2018-04-19 VITALS — BP 124/78 | HR 86 | Temp 97.8°F | Resp 18 | Ht <= 58 in | Wt 174.6 lb

## 2018-04-19 DIAGNOSIS — R29818 Other symptoms and signs involving the nervous system: Secondary | ICD-10-CM

## 2018-04-19 DIAGNOSIS — J411 Mucopurulent chronic bronchitis: Secondary | ICD-10-CM

## 2018-04-19 DIAGNOSIS — E785 Hyperlipidemia, unspecified: Secondary | ICD-10-CM | POA: Diagnosis not present

## 2018-04-19 DIAGNOSIS — R809 Proteinuria, unspecified: Secondary | ICD-10-CM | POA: Diagnosis not present

## 2018-04-19 DIAGNOSIS — G9519 Other vascular myelopathies: Secondary | ICD-10-CM

## 2018-04-19 DIAGNOSIS — M545 Low back pain, unspecified: Secondary | ICD-10-CM

## 2018-04-19 DIAGNOSIS — M48062 Spinal stenosis, lumbar region with neurogenic claudication: Secondary | ICD-10-CM

## 2018-04-19 DIAGNOSIS — R7982 Elevated C-reactive protein (CRP): Secondary | ICD-10-CM

## 2018-04-19 DIAGNOSIS — G8929 Other chronic pain: Secondary | ICD-10-CM

## 2018-04-19 DIAGNOSIS — E1142 Type 2 diabetes mellitus with diabetic polyneuropathy: Secondary | ICD-10-CM

## 2018-04-19 DIAGNOSIS — I700 Atherosclerosis of aorta: Secondary | ICD-10-CM

## 2018-04-19 DIAGNOSIS — E1129 Type 2 diabetes mellitus with other diabetic kidney complication: Secondary | ICD-10-CM

## 2018-04-19 DIAGNOSIS — L2089 Other atopic dermatitis: Secondary | ICD-10-CM

## 2018-04-19 DIAGNOSIS — G2581 Restless legs syndrome: Secondary | ICD-10-CM

## 2018-04-19 LAB — POCT GLYCOSYLATED HEMOGLOBIN (HGB A1C): HbA1c, POC (controlled diabetic range): 7.2 % — AB (ref 0.0–7.0)

## 2018-04-19 MED ORDER — METFORMIN HCL ER 750 MG PO TB24: 1500.0000 mg | ORAL_TABLET | Freq: Every day | ORAL | 1 refills | Status: DC

## 2018-04-19 MED ORDER — EMPAGLIFLOZIN 25 MG PO TABS: 25.0000 mg | ORAL_TABLET | Freq: Every day | ORAL | 1 refills | Status: DC

## 2018-04-19 MED ORDER — LISINOPRIL 5 MG PO TABS: 5.0000 mg | ORAL_TABLET | Freq: Every day | ORAL | 1 refills | Status: DC

## 2018-04-19 MED ORDER — HYDROCODONE-ACETAMINOPHEN 5-325 MG PO TABS: 2.0000 | ORAL_TABLET | Freq: Two times a day (BID) | ORAL | 0 refills | Status: DC | PRN

## 2018-04-19 MED ORDER — ATORVASTATIN CALCIUM 40 MG PO TABS: 40.0000 mg | ORAL_TABLET | Freq: Every day | ORAL | 1 refills | Status: DC

## 2018-04-19 NOTE — Progress Notes (Signed)
Name: Julie Wall   MRN: [id number]    DOB: 1955/07/21   Date:04/19/2018       Progress Note  Subjective  Chief Complaint  Chief Complaint  Patient presents with  . Medication Refill  . Diabetes    Not checking her sugar at home-Needs lancet and strips  . Hyperlipidemia  . COPD  . Hip Pain    Onset-1 month, right hip pain-before fall in the bath room  . RLS  . Fatigue  . Eczema  . Fall    Tuesday 04/16/2018 Has a recent fall in her home in the bathroom-her right leg gave out on her.    HPI  DMII with neuropathy and proteinuria: she has neurogenic claudication and history of proteinuria.. She is drinking mostly watershe is still skipping meals.because she has a lack of appetite. Her hgbA1C hadgone down from 7.5% to 6.9% ,up to 7. 2 , 7.4% 7.0% 6.6,6.8%,7.1% and now is down to 6.5% now up to 7.2% Denies polyphagia, polydipsia but she has noticed polyuria with Jardiance also on metformin, however states not very compliant with her diet lately, eating Halloween candy  She is on gabapentin and states leg pain is stable, usually more pain on the right calf than left, shealso takes ACE for microalbuminuria - protein was over 200 . BP is at goal.  Hyperlipidemia: out of Atorvastatin, needs a refill. denies side effects of medications. Explained the importance of continue on medication to decrease risk of heart attacks or strokes. Last LDL was at goalShe had lung cancer screening 05/2017 and it showed aorta atherosclerosis.She is also on aspirin  COPD: she quit smoking 12/03/2016, but resumed smoking again but only about 4 cigarettes daily . Shedenies daily cough, states inhaler helps with symptoms. No wheezing or SOB. She is taking medication prn only  Back pain: she has back pain a few times a week, pain now 0/10 at rest, but goes up to 5/10 when walking, occasionally goes higher and takes hydrocodone prn, she states sometimes right leg gives out and she fell this week  because of it - she did not get hurt.  She denies leg weakness, no bowel or bladder incontinence, she has numbness on left lateral thigh. Discussed risk of addiction and importance of no diversion. We decreased Hydrocodone to 30 pills to last 3 months, Fall 2017 and she is doing well, with prn medication.Advised to take Tylenol daily. Paresthesia on leg likely from neuralgia parestheticaon left leg.She also has calf pain secondary to neurogenic claudications. Discussed referral to Ortho but she wants to hold off   RLS: Shehas not been taking Requip, occasionally has symptoms. Seen by neurologist in the past. States still wakes with leg cramps occasionally, better than it used to be , she does not want anything else at this time  Atopic Dermatitis: doing much better on methotrexate continues to see [facility] dermatologist. Taking folic acid.Hair loss is stable. Explained may be secondary to methotrexate.Unchanged   Fatigue: She was seen in our office on 12/05/2016 because three days prior she developed fatigue, aching legs, and it got progressively worse ( heavy and tight) She went to church the following morning with very low energy, lack of appetite followed by she noticed some nausea and tightness between shoulder blades. She quit smoking because she got scared, but is smoking again now.We checked labs and showed very high c-reactive protein. She had a negative myoview done by Dr. Clayborn Bigness 05/2016. She denies synovitis, CRP is still elevated, sed rate is  back to normal, still has stiffness in am's worse on hips, but shoulders are not painful. We discussed referral to Rheumatologist , seen by Cardiologist recently but he did not address the high CRP level   Morbid Obesity: she has BMI above 35 and co-morbidities. Discussed life style modification   Patient Active Problem List   Diagnosis Date Noted  . Atherosclerosis of aorta ([place]) 05/04/2017  . Pap smear abnormality of cervix with ASCUS  favoring benign 04/23/2017  . Vitamin D deficiency 12/14/2016  . Obesity (BMI 30.0-34.9) 12/13/2015  . Chronic bronchitis ([place]) 05/14/2015  . DM type 2 with diabetic peripheral neuropathy ([place]) 05/14/2015  . Positive H. pylori test 04/05/2015  . Rapid urease test for Helicobacter pylori infection positive 04/05/2015  . Umbilical hernia 16/03/9603  . Exomphalos 04/01/2015  . RLS (restless legs syndrome) 01/08/2015  . Type 2 diabetes mellitus with other diabetic kidney complication ([place]) 54/02/8118  . Neurogenic claudication 01/08/2015  . AD (atopic dermatitis) 12/28/2014  . Chronic LBP 12/28/2014  . Dyslipidemia 12/28/2014  . Gastroesophageal reflux disease without esophagitis 12/28/2014  . Microalbuminuria 12/28/2014  . Disorder of bone and cartilage 12/28/2014  . Neuralgia of left thigh 12/28/2014  . Perennial allergic rhinitis with seasonal variation 12/28/2014  . Tobacco abuse 12/28/2014  . Hypothyroidism 09/22/2005  . Hypertension, benign 09/22/2005    Past Surgical History:  Procedure Laterality Date  . CATARACT EXTRACTION Bilateral 1980  . CESAREAN SECTION    . CORNEAL TRANSPLANT Left    [facility]-Treatment for blindness.    Family History  Problem Relation Age of Onset  . Diabetes Mother   . Hypertension Mother   . Pancreatic cancer Mother   . Alcohol abuse Father   . Arthritis/Rheumatoid Sister   . Diabetes Sister   . Kidney disease Sister   . Diabetes Brother   . Breast cancer Neg Hx     Social History   Socioeconomic History  . Marital status: Single    Spouse name: Not on file  . Number of children: 3  . Years of education: Not on file  . Highest education level: Not on file  Occupational History  . Not on file  Social Needs  . Financial resource strain: Somewhat hard  . Food insecurity:    Worry: Never true    Inability: Sometimes true  . Transportation needs:    Medical: No    Non-medical: No  Tobacco Use  . Smoking status: Current Every Day  Smoker    Packs/day: 0.25    Years: 46.00    Pack years: 11.50    Types: Cigarettes    Start date: 01/07/1985  . Smokeless tobacco: Never Used  . Tobacco comment: trying to quit  -used to smoke heavily in the past   Substance and Sexual Activity  . Alcohol use: No    Alcohol/week: 0.0 standard drinks  . Drug use: No  . Sexual activity: Never  Lifestyle  . Physical activity:    Days per week: 0 days    Minutes per session: 0 min  . Stress: Not at all  Relationships  . Social connections:    Talks on phone: More than three times a week    Gets together: More than three times a week    Attends religious service: More than 4 times per year    Active member of club or organization: Yes    Attends meetings of clubs or organizations: More than 4 times per year    Relationship status:  Never married  . Intimate partner violence:    Fear of current or ex partner: No    Emotionally abused: No    Physically abused: No    Forced sexual activity: No  Other Topics Concern  . Not on file  Social History Narrative   Disabled from severe atopic dermatitis   Has 3 children   Lives alone but very connect with family ( sees sister and her children daily)   Also belongs to a church     Current Outpatient Medications:  .  albuterol (PROVENTIL HFA;VENTOLIN HFA) 108 (90 Base) MCG/ACT inhaler, Inhale 2 puffs into the lungs every 6 (six) hours as needed for wheezing or shortness of breath., Disp: 1 Inhaler, Rfl: 0 .  Alcohol Swabs (ALCOHOL PREP) PADS, , Disp: , Rfl:  .  aspirin 81 MG chewable tablet, Chew 1 tablet by mouth daily., Disp: , Rfl:  .  atorvastatin (LIPITOR) 40 MG tablet, Take 1 tablet (40 mg total) by mouth daily. In place of simvastatin, Disp: 90 tablet, Rfl: 1 .  Blood Glucose Monitoring Suppl (ONE TOUCH ULTRA MINI) w/Device KIT, 1 each by Does not apply route daily., Disp: 1 each, Rfl: 0 .  Cholecalciferol (VITAMIN D) 2000 units CAPS, Take 1 capsule (2,000 Units total) by mouth  daily., Disp: 30 capsule, Rfl: 12 .  empagliflozin (JARDIANCE) 25 MG TABS tablet, Take 25 mg by mouth daily., Disp: 90 tablet, Rfl: 1 .  folic acid (FOLVITE) 1 MG tablet, Take by mouth., Disp: , Rfl:  .  gabapentin (NEURONTIN) 300 MG capsule, Take 1 capsule (300 mg total) by mouth 3 (three) times daily., Disp: 270 capsule, Rfl: 1 .  glucose blood test strip, , Disp: , Rfl:  .  HYDROcodone-acetaminophen (NORCO/VICODIN) 5-325 MG tablet, Take 2 tablets by mouth 2 (two) times daily as needed for moderate pain., Disp: 30 tablet, Rfl: 0 .  Lancet Devices (SIMPLE DIAGNOSTICS LANCING DEV) MISC, , Disp: , Rfl:  .  lisinopril (PRINIVIL,ZESTRIL) 5 MG tablet, Take 1 tablet (5 mg total) by mouth daily., Disp: 90 tablet, Rfl: 1 .  metFORMIN (GLUCOPHAGE-XR) 750 MG 24 hr tablet, Take 1 tablet (750 mg total) by mouth daily with breakfast., Disp: 90 tablet, Rfl: 1 .  methotrexate (RHEUMATREX) 2.5 MG tablet, Take 15 mg by mouth once a week. , Disp: , Rfl:  .  phenazopyridine (PYRIDIUM) 95 MG tablet, Take 1 tablet (95 mg total) by mouth 2 (two) times daily as needed for pain., Disp: 10 tablet, Rfl: 0 .  sulfamethoxazole-trimethoprim (BACTRIM DS,SEPTRA DS) 800-160 MG tablet, Take 1 tablet by mouth 2 (two) times daily., Disp: 14 tablet, Rfl: 0 .  triamcinolone cream (KENALOG) 0.1 %, TRIAMCINOLONE ACETONIDE, 0.1% (External Cream)  use per dermatologist. for 0 days  Quantity: 1.00;  Refills: 0   Ordered :15-Jun-2010  Steele Sizer MD;  Started 02-Apr-2008 Active Comments: DX: 724.2, Disp: 30 g, Rfl: 3 .  umeclidinium-vilanterol (ANORO ELLIPTA) 62.5-25 MCG/INH AEPB, Inhale 1 puff into the lungs daily., Disp: 180 each, Rfl: 2  Allergies  Allergen Reactions  . Penicillins Rash    I personally reviewed active problem list, medication list, allergies, family history, social history with the patient/caregiver today.   ROS  Constitutional: Negative for fever or weight change.  Respiratory: Negative for cough and  shortness of breath.   Cardiovascular: Negative for chest pain or palpitations.  Gastrointestinal: Negative for abdominal pain, no bowel changes.  Musculoskeletal: Negative for gait problem or joint swelling.  Skin: Positive for  rash.  Neurological: Negative for dizziness or headache.  No other specific complaints in a complete review of systems (except as listed in HPI above).  Objective  Vitals:   04/19/18 1126  BP: 124/78  Pulse: 86  Resp: 18  Temp: 97.8 F (36.6 C)  TempSrc: Oral  SpO2: 98%  Weight: 174 lb 9.6 oz (79.2 kg)  Height: 4' 10"  (1.473 m)    Body mass index is 36.49 kg/m.  Physical Exam  Constitutional: Patient appears well-developed and well-nourished. Obese No distress.  HEENT: head atraumatic, normocephalic, pupils equal and reactive to light, ear: eczema on both ear lobes but normal ear canal and TM, neck supple, throat within normal limits Cardiovascular: Normal rate, regular rhythm and normal heart sounds.  No murmur heard. No BLE edema. Pulmonary/Chest: Effort normal and breath sounds normal. No respiratory distress. Abdominal: Soft.  There is no tenderness. Skin: hyperpigmentation and white spots from scarring from eczema Psychiatric: Patient has a normal mood and affect. behavior is normal. Judgment and thought content normal.  Recent Results (from the past 2160 hour(s))  POCT Urinalysis Dipstick     Status: Abnormal   Collection Time: 02/19/18  9:24 AM  Result Value Ref Range   Color, UA yellow    Clarity, UA cloudy    Glucose, UA Positive (A) Negative    Comment: 2+   Bilirubin, UA Negative    Ketones, UA Negative    Spec Grav, UA 1.010 1.010 - 1.025   Blood, UA Positive     Comment: +++   pH, UA 6.0 5.0 - 8.0   Protein, UA Positive (A) Negative   Urobilinogen, UA 0.2 0.2 or 1.0 E.U./dL   Nitrite, UA Positive    Leukocytes, UA Negative Negative   Appearance Abnormal    Odor Abnormal   POCT Glucose (Device for Home Use)     Status:  Abnormal   Collection Time: 02/19/18  9:38 AM  Result Value Ref Range   Glucose Fasting, POC 176 (A) 70 - 99 mg/dL   POC Glucose 176 (A) 70 - 99 mg/dl  POCT Urinalysis Dipstick     Status: Abnormal   Collection Time: 03/05/18  9:03 AM  Result Value Ref Range   Color, UA dark yellow    Clarity, UA cloudy    Glucose, UA Positive (A) Negative    Comment: 2000   Bilirubin, UA neg    Ketones, UA neg    Spec Grav, UA 1.015 1.010 - 1.025   Blood, UA small    pH, UA 6.0 5.0 - 8.0   Protein, UA Positive (A) Negative    Comment: 100   Urobilinogen, UA 0.2 0.2 or 1.0 E.U./dL   Nitrite, UA neg    Leukocytes, UA Small (1+) (A) Negative   Appearance     Odor    Basic Metabolic Panel (BMET)     Status: Abnormal   Collection Time: 03/05/18  9:34 AM  Result Value Ref Range   Glucose, Bld 144 (H) 65 - 99 mg/dL    Comment: .            Fasting reference interval . For someone without known diabetes, a glucose value >125 mg/dL indicates that they may have diabetes and this should be confirmed with a follow-up test. .    BUN 11 7 - 25 mg/dL   Creat 0.62 0.50 - 0.99 mg/dL    Comment: For patients >70 years of age, the reference limit for Creatinine is approximately  13% higher for people identified as African-American. .    BUN/Creatinine Ratio NOT APPLICABLE 6 - 22 (calc)   Sodium 140 135 - 146 mmol/L   Potassium 4.1 3.5 - 5.3 mmol/L   Chloride 107 98 - 110 mmol/L   CO2 27 20 - 32 mmol/L   Calcium 9.0 8.6 - 10.4 mg/dL  Urine Microalbumin w/creat. ratio     Status: Abnormal   Collection Time: 03/05/18  9:34 AM  Result Value Ref Range   Creatinine, Urine 105 20 - 275 mg/dL   Microalb, Ur 22.7 mg/dL    Comment: Reference Range Not established    Microalb Creat Ratio 216 (H) <30 mcg/mg creat    Comment: . The ADA defines abnormalities in albumin excretion as follows: Mar[place] Kitchen Category         Result (mcg/mg creatinine) . Normal                    <30 Microalbuminuria         30-299   Clinical albuminuria   > OR = 300 . The ADA recommends that at least two of three specimens collected within a 3-6 month period be abnormal before considering a patient to be within a diagnostic category.   Urine Culture     Status: Abnormal   Collection Time: 03/05/18  9:34 AM  Result Value Ref Range   MICRO NUMBER: [id number]    SPECIMEN QUALITY: ADEQUATE    Sample Source URINE    STATUS: FINAL    ISOLATE 1: Escherichia coli (A)     Comment: Greater than 100,000 CFU/mL of Escherichia coli      Susceptibility   Escherichia coli - URINE CULTURE, REFLEX    AMOX/CLAVULANIC 8 Sensitive     AMPICILLIN >=32 Resistant     AMPICILLIN/SULBACTAM 16 Intermediate     CEFAZOLIN* <=4 Not Reportable      * For infections other than uncomplicated UTIcaused by E. coli, K. pneumoniae or P. mirabilis:Cefazolin is resistant if MIC > or = 8 mcg/mL.(Distinguishing susceptible versus intermediatefor isolates with MIC < or = 4 mcg/mL requiresadditional testing.)For uncomplicated UTI caused by E. coli,K. pneumoniae or P. mirabilis: Cefazolin issusceptible if MIC <32 mcg/mL and predictssusceptible to the oral agents cefaclor, cefdinir,cefpodoxime, cefprozil, cefuroxime, cephalexinand loracarbef.    CEFEPIME <=1 Sensitive     CEFTRIAXONE <=1 Sensitive     CIPROFLOXACIN 1 Sensitive     LEVOFLOXACIN 1 Sensitive     ERTAPENEM <=0.5 Sensitive     GENTAMICIN <=1 Sensitive     IMIPENEM <=0.25 Sensitive     NITROFURANTOIN <=16 Sensitive     PIP/TAZO <=4 Sensitive     TOBRAMYCIN <=1 Sensitive     TRIMETH/SULFA* <=20 Sensitive      * For infections other than uncomplicated UTIcaused by E. coli, K. pneumoniae or P. mirabilis:Cefazolin is resistant if MIC > or = 8 mcg/mL.(Distinguishing susceptible versus intermediatefor isolates with MIC < or = 4 mcg/mL requiresadditional testing.)For uncomplicated UTI caused by E. coli,K. pneumoniae or P. mirabilis: Cefazolin issusceptible if MIC <32 mcg/mL and predictssusceptible to  the oral agents cefaclor, cefdinir,cefpodoxime, cefprozil, cefuroxime, cephalexinand loracarbef.Legend:S = Susceptible  I = IntermediateR = Resistant  NS = Not susceptible* = Not tested  NR = Not reported**NN = See antimicrobic comments  POCT Urinalysis Dipstick     Status: Abnormal   Collection Time: 03/21/18  9:00 AM  Result Value Ref Range   Color, UA yellow    Clarity, UA cloudy  Glucose, UA Positive (A) Negative   Bilirubin, UA negative    Ketones, UA negative    Spec Grav, UA 1.015 1.010 - 1.025   Blood, UA trace    pH, UA 6.0 5.0 - 8.0   Protein, UA Negative Negative   Urobilinogen, UA 0.2 0.2 or 1.0 E.U./dL   Nitrite, UA Negative    Leukocytes, UA Negative Negative   Appearance normal    Odor normal   POCT HgB A1C     Status: Abnormal   Collection Time: 04/19/18 11:35 AM  Result Value Ref Range   Hemoglobin A1C     HbA1c POC (<> result, manual entry)     HbA1c, POC (prediabetic range)     HbA1c, POC (controlled diabetic range) 7.2 (A) 0.0 - 7.0 %     PHQ2/9: Depression screen [facility] 2/9 04/19/2018 01/16/2018 10/19/2017 04/18/2017 03/16/2017  Decreased Interest 0 0 0 0 0  Down, Depressed, Hopeless 0 0 0 0 0  PHQ - 2 Score 0 0 0 0 0  Altered sleeping - 0 - - -  Tired, decreased energy - 0 - - -  Change in appetite - 0 - - -  Feeling bad or failure about yourself  - 0 - - -  Trouble concentrating - 0 - - -  Moving slowly or fidgety/restless - 0 - - -  Suicidal thoughts - 0 - - -  PHQ-9 Score - 0 - - -  Difficult doing work/chores - Not difficult at all - - -    Fall Risk: Fall Risk  04/19/2018 03/21/2018 03/21/2018 01/16/2018 10/19/2017  Falls in the past year? 1 No No Yes No  Number falls in past yr: 1 - - 1 -  Injury with Fall? 1 - - No -  Comment Hip Pain-Leg will give out on her - - - -  Risk Factor Category  - - - - -  Follow up - - - - -     Functional Status Survey: Is the patient deaf or have difficulty hearing?: No Does the patient have difficulty  seeing, even when wearing glasses/contacts?: Yes Does the patient have difficulty concentrating, remembering, or making decisions?: No Does the patient have difficulty walking or climbing stairs?: No Does the patient have difficulty dressing or bathing?: No Does the patient have difficulty doing errands alone such as visiting a doctor's office or shopping?: Yes(Does not drive)    Assessment & Plan  1. DM type 2 with diabetic peripheral neuropathy (HCC)  - POCT HgB A1C - empagliflozin (JARDIANCE) 25 MG TABS tablet; Take 25 mg by mouth daily.  Dispense: 90 tablet; Refill: 1 - metFORMIN (GLUCOPHAGE-XR) 750 MG 24 hr tablet; Take 2 tablets (1,500 mg total) by mouth daily with breakfast.  Dispense: 180 tablet; Refill: 1  2. Dyslipidemia  - atorvastatin (LIPITOR) 40 MG tablet; Take 1 tablet (40 mg total) by mouth daily. In place of simvastatin  Dispense: 90 tablet; Refill: 1  3. Microalbuminuria  - lisinopril (PRINIVIL,ZESTRIL) 5 MG tablet; Take 1 tablet (5 mg total) by mouth daily.  Dispense: 90 tablet; Refill: 1  4. Neurogenic claudication  - HYDROcodone-acetaminophen (NORCO/VICODIN) 5-325 MG tablet; Take 2 tablets by mouth 2 (two) times daily as needed for moderate pain.  Dispense: 30 tablet; Refill: 0  5. Chronic bilateral low back pain without sciatica  - HYDROcodone-acetaminophen (NORCO/VICODIN) 5-325 MG tablet; Take 2 tablets by mouth 2 (two) times daily as needed for moderate pain.  Dispense: 30 tablet; Refill: 0  6. Type 2 diabetes mellitus with microalbuminuria, without [place]-term current use of insulin (HCC)  - empagliflozin (JARDIANCE) 25 MG TABS tablet; Take 25 mg by mouth daily.  Dispense: 90 tablet; Refill: 1 - metFORMIN (GLUCOPHAGE-XR) 750 MG 24 hr tablet; Take 2 tablets (1,500 mg total) by mouth daily with breakfast.  Dispense: 180 tablet; Refill: 1  7. Mucopurulent chronic bronchitis ([place])  Needs to quit smoking  8. Other atopic dermatitis  Continue  medication  9. Elevated C-reactive protein  She saw Dr. Clayborn Bigness but on his note there is no mention of CRP, we will send him a message to find out if she needs further evaluation   10. Atherosclerosis of aorta (HCC)  On statin and aspirin   11. RLS (restless legs syndrome)  Stable  12. Morbid obesity ([place])  Based on BMI above 35 with co-morbidities

## 2018-04-20 ENCOUNTER — Telehealth: Payer: Self-pay

## 2018-04-20 NOTE — Telephone Encounter (Signed)
Call pt regarding lung screening. No answer pt has no VMX.

## 2018-04-24 ENCOUNTER — Telehealth: Payer: Self-pay | Admitting: *Deleted

## 2018-04-24 DIAGNOSIS — Z122 Encounter for screening for malignant neoplasm of respiratory organs: Secondary | ICD-10-CM

## 2018-04-24 DIAGNOSIS — Z87891 Personal history of nicotine dependence: Secondary | ICD-10-CM

## 2018-04-24 NOTE — Telephone Encounter (Signed)
Patient has been notified that annual lung cancer screening low dose CT scan is due currently or will be in near future. Confirmed that patient is within the age range of 55-77, and asymptomatic, (no signs or symptoms of lung cancer). Patient denies illness that would prevent curative treatment for lung cancer if found. Verified smoking history, (current, 35 pack year). The shared decision making visit was done 05/01/17. Patient is agreeable for CT scan being scheduled.

## 2018-05-06 ENCOUNTER — Ambulatory Visit: Admission: RE | Admit: 2018-05-06 | Payer: Medicare Other | Source: Ambulatory Visit

## 2018-05-10 ENCOUNTER — Ambulatory Visit
Admission: RE | Admit: 2018-05-10 | Discharge: 2018-05-10 | Disposition: A | Discharge status: Home / Self Care | Payer: Medicare Other | Source: Ambulatory Visit | Attending: Nurse Practitioner | Admitting: Nurse Practitioner

## 2018-05-10 DIAGNOSIS — Z122 Encounter for screening for malignant neoplasm of respiratory organs: Secondary | ICD-10-CM | POA: Diagnosis not present

## 2018-05-10 DIAGNOSIS — Z87891 Personal history of nicotine dependence: Secondary | ICD-10-CM | POA: Diagnosis not present

## 2018-05-13 ENCOUNTER — Encounter: Payer: Self-pay | Admitting: *Deleted

## 2018-05-20 ENCOUNTER — Ambulatory Visit (INDEPENDENT_AMBULATORY_CARE_PROVIDER_SITE_OTHER): Payer: Medicare Other | Admitting: Nurse Practitioner

## 2018-05-20 ENCOUNTER — Other Ambulatory Visit: Payer: Self-pay | Admitting: Nurse Practitioner

## 2018-05-20 ENCOUNTER — Encounter: Payer: Self-pay | Admitting: Nurse Practitioner

## 2018-05-20 ENCOUNTER — Ambulatory Visit
Admission: RE | Admit: 2018-05-20 | Discharge: 2018-05-20 | Disposition: A | Discharge status: Home / Self Care | Payer: Medicare Other | Source: Ambulatory Visit | Attending: Nurse Practitioner | Admitting: Nurse Practitioner

## 2018-05-20 ENCOUNTER — Telehealth: Payer: Self-pay | Admitting: Nurse Practitioner

## 2018-05-20 ENCOUNTER — Ambulatory Visit
Admission: RE | Admit: 2018-05-20 | Discharge: 2018-05-20 | Disposition: A | Discharge status: Home / Self Care | Payer: Medicare Other | Attending: Nurse Practitioner | Admitting: Nurse Practitioner

## 2018-05-20 VITALS — BP 108/66 | HR 92 | Temp 98.2°F | Resp 16 | Ht <= 58 in | Wt 174.5 lb

## 2018-05-20 DIAGNOSIS — R0602 Shortness of breath: Secondary | ICD-10-CM | POA: Insufficient documentation

## 2018-05-20 DIAGNOSIS — R059 Cough, unspecified: Secondary | ICD-10-CM

## 2018-05-20 DIAGNOSIS — R05 Cough: Secondary | ICD-10-CM

## 2018-05-20 MED ORDER — BENZONATATE 100 MG PO CAPS: 100.0000 mg | ORAL_CAPSULE | Freq: Two times a day (BID) | ORAL | 0 refills | Status: DC | PRN

## 2018-05-20 MED ORDER — PROMETHAZINE-DM 6.25-15 MG/5ML PO SYRP: 2.5000 mL | ORAL_SOLUTION | Freq: Four times a day (QID) | ORAL | 0 refills | Status: DC | PRN

## 2018-05-20 MED ORDER — GUAIFENESIN 400 MG PO TABS: 400.0000 mg | ORAL_TABLET | Freq: Four times a day (QID) | ORAL | 0 refills | Status: DC

## 2018-05-20 NOTE — Telephone Encounter (Signed)
Sent new cough medication

## 2018-05-20 NOTE — Telephone Encounter (Signed)
-----   Message from Clemmie KrillLatisha A Richmond, [place]MA sent at 05/20/2018  1:45 PM EST ----- When I called this patient to review chest x-ray she informed me that [facility].  Is there anything else she can take?

## 2018-05-20 NOTE — Progress Notes (Signed)
Name: Julie Wall   MRN: [id number]    DOB: 1956/04/14   Date:05/20/2018       Progress Note  Subjective  Chief Complaint  Chief Complaint  Patient presents with  . Cough    patient presents with a productive cough x 1 week. sputum is greenish yellow and thick. worse at night - keeps her up. no otc meds taken  . Shortness of Breath    HPI  Patient states 2 weeks ago with runny nose, sore throat last week progressed to cough now getting green/yellow phlegm in the morning. States keeps her up at night- cough is worse at night.  No fevers, chills, body aches, facial fullness, sore throat.  Has shortness of breath when coughing episodes.  Still smoking but cut down to 4 cigarettes a day.     Patient Active Problem List   Diagnosis Date Noted  . Atherosclerosis of aorta ([place]) 05/04/2017  . Pap smear abnormality of cervix with ASCUS favoring benign 04/23/2017  . Vitamin D deficiency 12/14/2016  . Obesity (BMI 30.0-34.9) 12/13/2015  . Chronic bronchitis ([place]) 05/14/2015  . DM type 2 with diabetic peripheral neuropathy ([place]) 05/14/2015  . Positive H. pylori test 04/05/2015  . Rapid urease test for Helicobacter pylori infection positive 04/05/2015  . Umbilical hernia 40/98/1191  . Exomphalos 04/01/2015  . RLS (restless legs syndrome) 01/08/2015  . Type 2 diabetes mellitus with other diabetic kidney complication ([place]) 47/82/9562  . Neurogenic claudication 01/08/2015  . AD (atopic dermatitis) 12/28/2014  . Chronic LBP 12/28/2014  . Dyslipidemia 12/28/2014  . Gastroesophageal reflux disease without esophagitis 12/28/2014  . Microalbuminuria 12/28/2014  . Disorder of bone and cartilage 12/28/2014  . Neuralgia of left thigh 12/28/2014  . Perennial allergic rhinitis with seasonal variation 12/28/2014  . Tobacco abuse 12/28/2014  . Hypothyroidism 09/22/2005  . Hypertension, benign 09/22/2005    Past Medical History:  Diagnosis Date  . Allergy   . ASCUS favor benign 09/2013   negative HPV  . Atopic dermatitis    Dermatologist at [facility]  . Chronic low back pain   . Decreased exercise tolerance   . Dental caries   . Diabetes mellitus without complication ([place])   . GERD (gastroesophageal reflux disease)   . Hypertension   . Lumbosacral neuritis   . Microalbuminuria   . Osteopenia   . Ovarian failure   . Tobacco use     Past Surgical History:  Procedure Laterality Date  . CATARACT EXTRACTION Bilateral 1980  . CESAREAN SECTION    . CORNEAL TRANSPLANT Left    [facility]-Treatment for blindness.    Social History   Tobacco Use  . Smoking status: Current Every Day Smoker    Packs/day: 0.25    Years: 46.00    Pack years: 11.50    Types: Cigarettes    Start date: 01/07/1985  . Smokeless tobacco: Never Used  . Tobacco comment: patient is down to 4/day.trying to quit  -used to smoke heavily in the past   Substance Use Topics  . Alcohol use: No    Alcohol/week: 0.0 standard drinks     Current Outpatient Medications:  .  albuterol (PROVENTIL HFA;VENTOLIN HFA) 108 (90 Base) MCG/ACT inhaler, Inhale 2 puffs into the lungs every 6 (six) hours as needed for wheezing or shortness of breath., Disp: 1 Inhaler, Rfl: 0 .  Alcohol Swabs (ALCOHOL PREP) PADS, , Disp: , Rfl:  .  aspirin 81 MG chewable tablet, Chew 1 tablet by mouth daily., Disp: , Rfl:  .  atorvastatin (LIPITOR) 40 MG tablet, Take 1 tablet (40 mg total) by mouth daily. In place of simvastatin, Disp: 90 tablet, Rfl: 1 .  Blood Glucose Monitoring Suppl (ONE TOUCH ULTRA MINI) w/Device KIT, 1 each by Does not apply route daily., Disp: 1 each, Rfl: 0 .  Cholecalciferol (VITAMIN D) 2000 units CAPS, Take 1 capsule (2,000 Units total) by mouth daily., Disp: 30 capsule, Rfl: 12 .  empagliflozin (JARDIANCE) 25 MG TABS tablet, Take 25 mg by mouth daily., Disp: 90 tablet, Rfl: 1 .  folic acid (FOLVITE) 1 MG tablet, Take by mouth., Disp: , Rfl:  .  gabapentin (NEURONTIN) 300 MG capsule, Take 1 capsule (300 mg total) by  mouth 3 (three) times daily., Disp: 270 capsule, Rfl: 1 .  glucose blood test strip, , Disp: , Rfl:  .  guaifenesin (HUMIBID E) 400 MG TABS tablet, Take 1 tablet (400 mg total) by mouth every 6 (six) hours., Disp: 30 tablet, Rfl: 0 .  HYDROcodone-acetaminophen (NORCO/VICODIN) 5-325 MG tablet, Take 2 tablets by mouth 2 (two) times daily as needed for moderate pain., Disp: 30 tablet, Rfl: 0 .  Lancet Devices (SIMPLE DIAGNOSTICS LANCING DEV) MISC, , Disp: , Rfl:  .  lisinopril (PRINIVIL,ZESTRIL) 5 MG tablet, Take 1 tablet (5 mg total) by mouth daily., Disp: 90 tablet, Rfl: 1 .  metFORMIN (GLUCOPHAGE-XR) 750 MG 24 hr tablet, Take 2 tablets (1,500 mg total) by mouth daily with breakfast., Disp: 180 tablet, Rfl: 1 .  methotrexate (RHEUMATREX) 2.5 MG tablet, Take 15 mg by mouth once a week. , Disp: , Rfl:  .  promethazine-dextromethorphan (PROMETHAZINE-DM) 6.25-15 MG/5ML syrup, Take 2.5 mLs by mouth 4 (four) times daily as needed for cough., Disp: 118 mL, Rfl: 0 .  triamcinolone cream (KENALOG) 0.1 %, TRIAMCINOLONE ACETONIDE, 0.1% (External Cream)  use per dermatologist. for 0 days  Quantity: 1.00;  Refills: 0   Ordered :15-Jun-2010  Steele Sizer MD;  Started 02-Apr-2008 Active Comments: DX: 724.2, Disp: 30 g, Rfl: 3 .  umeclidinium-vilanterol (ANORO ELLIPTA) 62.5-25 MCG/INH AEPB, Inhale 1 puff into the lungs daily., Disp: 180 each, Rfl: 2  Allergies  Allergen Reactions  . Penicillins Rash    ROS   No other specific complaints in a complete review of systems (except as listed in HPI above).  Objective  Vitals:   05/20/18 1000 05/20/18 1048  BP: 108/66   Pulse: (!) 105 92  Resp: 18 16  Temp: 98.2 F (36.8 C)   TempSrc: Oral   SpO2: 95%   Weight: 174 lb 8 oz (79.2 kg)   Height: 4' 10"  (1.473 m)     Body mass index is 36.47 kg/m.  Nursing Note and Vital Signs reviewed.  Physical Exam Constitutional:      Appearance: She is not ill-appearing.  HENT:     Head: Normocephalic and  atraumatic.     Right Ear: External ear normal.     Left Ear: External ear normal.     Nose: Nose normal.     Right Turbinates: Enlarged.     Left Turbinates: Enlarged.     Right Sinus: No maxillary sinus tenderness or frontal sinus tenderness.     Left Sinus: No maxillary sinus tenderness or frontal sinus tenderness.     Mouth/Throat:     Lips: Pink.     Mouth: Mucous membranes are dry.     Pharynx: Uvula midline. No oropharyngeal exudate.  Eyes:     General:        Right  eye: No discharge.        Left eye: No discharge.     Extraocular Movements: Extraocular movements intact.     Conjunctiva/sclera: Conjunctivae normal.     Pupils: Pupils are equal, round, and reactive to light.  Neck:     Musculoskeletal: Normal range of motion.  Cardiovascular:     Rate and Rhythm: Normal rate and regular rhythm.  Pulmonary:     Effort: Pulmonary effort is normal. No tachypnea or respiratory distress.     Breath sounds: Normal breath sounds. No decreased breath sounds or wheezing.  Abdominal:     Tenderness: There is no abdominal tenderness.  Lymphadenopathy:     Cervical: No cervical adenopathy.  Skin:    General: Skin is warm and dry.     Findings: No rash.  Neurological:     General: No focal deficit present.     Mental Status: She is alert.  Psychiatric:        Mood and Affect: Mood normal.        Behavior: Behavior normal.        Judgment: Judgment normal.     No results found for this or any previous visit (from the past 48 hour(s)).  Assessment & Plan  1. Cough Likely URI- due to comorbidities and duration consider abx therapy- will get chest xray and quick follow-up discussed signs and symptoms for ROC and ER precautions.  - DG Chest 2 View; Future - promethazine-dextromethorphan (PROMETHAZINE-DM) 6.25-15 MG/5ML syrup; Take 2.5 mLs by mouth 4 (four) times daily as needed for cough.  Dispense: 118 mL; Refill: 0 - guaifenesin (HUMIBID E) 400 MG TABS tablet; Take 1 tablet  (400 mg total) by mouth every 6 (six) hours.  Dispense: 30 tablet; Refill: 0  2. Shortness of breath  - DG Chest 2 View; Future  -Red flags and when to present for emergency care or RTC including fever >101.60F, chest pain, shortness of breath, new/worsening/un-resolving symptoms,  reviewed with patient at time of visit. Follow up and care instructions discussed and provided in AVS.

## 2018-05-20 NOTE — Patient Instructions (Addendum)
-   Please get chest x-ray across the street - take cough medicine as prescribed; use humidifier  - If you develop shortness of breath, chest pain, fever/chills unrelieved by tylenol please come in urgently or to ER/ Urgent care.  Upper Respiratory Infection, Adult Most upper respiratory infections (URIs) are caused by a virus. A URI affects the nose, throat, and upper air passages. The most common type of URI is often called "the common cold." Follow these instructions at home:  Take medicines only as told by your doctor.  Gargle warm saltwater or take cough drops to comfort your throat as told by your doctor.  Use a warm mist humidifier or inhale steam from a shower to increase air moisture. This may make it easier to breathe.  Drink enough fluid to keep your pee (urine) clear or pale yellow.  Eat soups and other clear broths.  Have a healthy diet.  Rest as needed.  Go back to work when your fever is gone or your doctor says it is okay. ? You may need to stay home longer to avoid giving your URI to others. ? You can also wear a face mask and wash your hands often to prevent spread of the virus.  Use your inhaler more if you have asthma.  Do not use any tobacco products, including cigarettes, chewing tobacco, or electronic cigarettes. If you need help quitting, ask your doctor. Contact a doctor if:  You are getting worse, not better.  Your symptoms are not helped by medicine.  You have chills.  You are getting more short of breath.  You have brown or red mucus.  You have yellow or brown discharge from your nose.  You have pain in your face, especially when you bend forward.  You have a fever.  You have puffy (swollen) neck glands.  You have pain while swallowing.  You have white areas in the back of your throat. Get help right away if:  You have very bad or constant: ? Headache. ? Ear pain. ? Pain in your forehead, behind your eyes, and over your cheekbones  (sinus pain). ? Chest pain.  You have [place]-lasting (chronic) lung disease and any of the following: ? Wheezing. ? [place]-lasting cough. ? Coughing up blood. ? A change in your usual mucus.  You have a stiff neck.  You have changes in your: ? Vision. ? Hearing. ? Thinking. ? Mood. This information is not intended to replace advice given to you by your health care provider. Make sure you discuss any questions you have with your health care provider. Document Released: 11/08/2007 Document Revised: 01/23/2016 Document Reviewed: 08/27/2013 Elsevier Interactive Patient Education  2018 [employer]

## 2018-05-23 ENCOUNTER — Ambulatory Visit (INDEPENDENT_AMBULATORY_CARE_PROVIDER_SITE_OTHER): Payer: Medicare Other

## 2018-05-23 ENCOUNTER — Ambulatory Visit (INDEPENDENT_AMBULATORY_CARE_PROVIDER_SITE_OTHER): Payer: Medicare Other | Admitting: Nurse Practitioner

## 2018-05-23 ENCOUNTER — Encounter: Payer: Self-pay | Admitting: Nurse Practitioner

## 2018-05-23 VITALS — BP 104/64 | HR 103 | Temp 98.3°F | Resp 16 | Ht <= 58 in | Wt 174.4 lb

## 2018-05-23 VITALS — BP 104/64 | HR 88 | Temp 98.3°F | Resp 16 | Ht <= 58 in | Wt 174.4 lb

## 2018-05-23 DIAGNOSIS — R05 Cough: Secondary | ICD-10-CM

## 2018-05-23 DIAGNOSIS — Z0000 Encounter for general adult medical examination without abnormal findings: Secondary | ICD-10-CM | POA: Diagnosis not present

## 2018-05-23 DIAGNOSIS — R059 Cough, unspecified: Secondary | ICD-10-CM

## 2018-05-23 NOTE — Patient Instructions (Signed)
Julie Wall , Thank you for taking time to come for your Medicare Wellness Visit. I appreciate your ongoing commitment to your health goals. Please review the following plan we discussed and let me know if I can assist you in the future.   Screening recommendations/referrals: Colonoscopy: done 07/05/2010 repeat in 2022 Mammogram: done 11/05/17 repeat in June 2020 Bone Density: due at age 62 Recommended yearly ophthalmology/optometry visit for glaucoma screening and checkup Recommended yearly dental visit for hygiene and checkup  Vaccinations: Influenza vaccine: done 02/19/18 Pneumococcal vaccine: done 04/01/15 Tdap vaccine: done 01/25/12 Shingles vaccine: Shingrix discussed. Please contact your pharmacy for coverage information.     Advanced directives: Advance directive discussed with you today. Even though you declined this today please call our office should you change your mind and we can give you the proper paperwork for you to fill out.  Conditions/risks identified: If you wish to quit smoking, help is available. For free tobacco cessation program offerings call the [facility] at [phone] or Live Well Line at [phone]. You may also visit [email or web address] or email [email or web address] for more information on other programs.    Next appointment: Please follow up in one year for your Medicare Annual Wellness visit.    Preventive Care 40-64 Years, Female Preventive care refers to lifestyle choices and visits with your health care provider that can promote health and wellness. What does preventive care include?  A yearly physical exam. This is also called an annual well check.  Dental exams once or twice a year.  Routine eye exams. Ask your health care provider how often you should have your eyes checked.  Personal lifestyle choices, including:  Daily care of your teeth and gums.  Regular physical activity.  Eating a healthy diet.  Avoiding  tobacco and drug use.  Limiting alcohol use.  Practicing safe sex.  Taking low-dose aspirin daily starting at age 36.  Taking vitamin and mineral supplements as recommended by your health care provider. What happens during an annual well check? The services and screenings done by your health care provider during your annual well check will depend on your age, overall health, lifestyle risk factors, and family history of disease. Counseling  Your health care provider may ask you questions about your:  Alcohol use.  Tobacco use.  Drug use.  Emotional well-being.  Home and relationship well-being.  Sexual activity.  Eating habits.  Work and work Statistician.  Method of birth control.  Menstrual cycle.  Pregnancy history. Screening  You may have the following tests or measurements:  Height, weight, and BMI.  Blood pressure.  Lipid and cholesterol levels. These may be checked every 5 years, or more frequently if you are over 58 years old.  Skin check.  Lung cancer screening. You may have this screening every year starting at age 74 if you have a 30-pack-year history of smoking and currently smoke or have quit within the past 15 years.  Fecal occult blood test (FOBT) of the stool. You may have this test every year starting at age 59.  Flexible sigmoidoscopy or colonoscopy. You may have a sigmoidoscopy every 5 years or a colonoscopy every 10 years starting at age 26.  Hepatitis C blood test.  Hepatitis B blood test.  Sexually transmitted disease (STD) testing.  Diabetes screening. This is done by checking your blood sugar (glucose) after you have not eaten for a while (fasting). You may have this done every 1-3 years.  Mammogram. This may be  done every 1-2 years. Talk to your health care provider about when you should start having regular mammograms. This may depend on whether you have a family history of breast cancer.  BRCA-related cancer screening. This may  be done if you have a family history of breast, ovarian, tubal, or peritoneal cancers.  Pelvic exam and Pap test. This may be done every 3 years starting at age 89. Starting at age 7, this may be done every 5 years if you have a Pap test in combination with an HPV test.  Bone density scan. This is done to screen for osteoporosis. You may have this scan if you are at high risk for osteoporosis. Discuss your test results, treatment options, and if necessary, the need for more tests with your health care provider. Vaccines  Your health care provider may recommend certain vaccines, such as:  Influenza vaccine. This is recommended every year.  Tetanus, diphtheria, and acellular pertussis (Tdap, Td) vaccine. You may need a Td booster every 10 years.  Zoster vaccine. You may need this after age 45.  Pneumococcal 13-valent conjugate (PCV13) vaccine. You may need this if you have certain conditions and were not previously vaccinated.  Pneumococcal polysaccharide (PPSV23) vaccine. You may need one or two doses if you smoke cigarettes or if you have certain conditions. Talk to your health care provider about which screenings and vaccines you need and how often you need them. This information is not intended to replace advice given to you by your health care provider. Make sure you discuss any questions you have with your health care provider. Document Released: 06/18/2015 Document Revised: 02/09/2016 Document Reviewed: 03/23/2015 Elsevier Interactive Patient Education  2017 [employer] [employer] Prevention in the Home Falls can cause injuries. They can happen to people of all ages. There are many things you can do to make your home safe and to help prevent falls. What can I do on the outside of my home?  Regularly fix the edges of walkways and driveways and fix any cracks.  Remove anything that might make you trip as you walk through a door, such as a raised step or threshold.  Trim any  bushes or trees on the path to your home.  Use bright outdoor lighting.  Clear any walking paths of anything that might make someone trip, such as rocks or tools.  Regularly check to see if handrails are loose or broken. Make sure that both sides of any steps have handrails.  Any raised decks and porches should have guardrails on the edges.  Have any leaves, snow, or ice cleared regularly.  Use sand or salt on walking paths during winter.  Clean up any spills in your garage right away. This includes oil or grease spills. What can I do in the bathroom?  Use night lights.  Install grab bars by the toilet and in the tub and shower. Do not use towel bars as grab bars.  Use non-skid mats or decals in the tub or shower.  If you need to sit down in the shower, use a plastic, non-slip stool.  Keep the floor dry. Clean up any water that spills on the floor as soon as it happens.  Remove soap buildup in the tub or shower regularly.  Attach bath mats securely with double-sided non-slip rug tape.  Do not have throw rugs and other things on the floor that can make you trip. What can I do in the bedroom?  Use night lights.  Make sure that you have a light by your bed that is easy to reach.  Do not use any sheets or blankets that are too big for your bed. They should not hang down onto the floor.  Have a firm chair that has side arms. You can use this for support while you get dressed.  Do not have throw rugs and other things on the floor that can make you trip. What can I do in the kitchen?  Clean up any spills right away.  Avoid walking on wet floors.  Keep items that you use a lot in easy-to-reach places.  If you need to reach something above you, use a strong step stool that has a grab bar.  Keep electrical cords out of the way.  Do not use floor polish or wax that makes floors slippery. If you must use wax, use non-skid floor wax.  Do not have throw rugs and other  things on the floor that can make you trip. What can I do with my stairs?  Do not leave any items on the stairs.  Make sure that there are handrails on both sides of the stairs and use them. Fix handrails that are broken or loose. Make sure that handrails are as [place] as the stairways.  Check any carpeting to make sure that it is firmly attached to the stairs. Fix any carpet that is loose or worn.  Avoid having throw rugs at the top or bottom of the stairs. If you do have throw rugs, attach them to the floor with carpet tape.  Make sure that you have a light switch at the top of the stairs and the bottom of the stairs. If you do not have them, ask someone to add them for you. What else can I do to help prevent falls?  Wear shoes that:  Do not have high heels.  Have rubber bottoms.  Are comfortable and fit you well.  Are closed at the toe. Do not wear sandals.  If you use a stepladder:  Make sure that it is fully opened. Do not climb a closed stepladder.  Make sure that both sides of the stepladder are locked into place.  Ask someone to hold it for you, if possible.  Clearly mark and make sure that you can see:  Any grab bars or handrails.  First and last steps.  Where the edge of each step is.  Use tools that help you move around (mobility aids) if they are needed. These include:  Canes.  Walkers.  Scooters.  Crutches.  Turn on the lights when you go into a dark area. Replace any light bulbs as soon as they burn out.  Set up your furniture so you have a clear path. Avoid moving your furniture around.  If any of your floors are uneven, fix them.  If there are any pets around you, be aware of where they are.  Review your medicines with your doctor. Some medicines can make you feel dizzy. This can increase your chance of falling. Ask your doctor what other things that you can do to help prevent falls. This information is not intended to replace advice given to  you by your health care provider. Make sure you discuss any questions you have with your health care provider. Document Released: 03/18/2009 Document Revised: 10/28/2015 Document Reviewed: 06/26/2014 Elsevier Interactive Patient Education  2017 [employer].

## 2018-05-23 NOTE — Progress Notes (Signed)
Name: Julie Wall   MRN: [id number]    DOB: Oct 15, 1955   Date:05/23/2018       Progress Note  Subjective  Chief Complaint  Chief Complaint  Patient presents with  . Follow-up    3 day f/u    HPI  Patient here for 3 day follow up of URI- endorses is feeling much better, still has congested cough but is much improved. No fevers, chills, shortness of breath, chest pain.   Patient Active Problem List   Diagnosis Date Noted  . Atherosclerosis of aorta ([place]) 05/04/2017  . Pap smear abnormality of cervix with ASCUS favoring benign 04/23/2017  . Vitamin D deficiency 12/14/2016  . Obesity (BMI 30.0-34.9) 12/13/2015  . Chronic bronchitis ([place]) 05/14/2015  . DM type 2 with diabetic peripheral neuropathy ([place]) 05/14/2015  . Positive H. pylori test 04/05/2015  . Rapid urease test for Helicobacter pylori infection positive 04/05/2015  . Umbilical hernia 60/73/7106  . Exomphalos 04/01/2015  . RLS (restless legs syndrome) 01/08/2015  . Type 2 diabetes mellitus with other diabetic kidney complication ([place]) 26/94/8546  . Neurogenic claudication 01/08/2015  . AD (atopic dermatitis) 12/28/2014  . Chronic LBP 12/28/2014  . Dyslipidemia 12/28/2014  . Gastroesophageal reflux disease without esophagitis 12/28/2014  . Microalbuminuria 12/28/2014  . Disorder of bone and cartilage 12/28/2014  . Neuralgia of left thigh 12/28/2014  . Perennial allergic rhinitis with seasonal variation 12/28/2014  . Tobacco abuse 12/28/2014  . Hypothyroidism 09/22/2005  . Hypertension, benign 09/22/2005    Past Medical History:  Diagnosis Date  . Allergy   . ASCUS favor benign 09/2013   negative HPV  . Atopic dermatitis    Dermatologist at [facility]  . Chronic low back pain   . COPD (chronic obstructive pulmonary disease) ([place])   . Decreased exercise tolerance   . Dental caries   . Diabetes mellitus without complication ([place])   . GERD (gastroesophageal reflux disease)   . Hyperlipidemia   . Hypertension   .  Lumbosacral neuritis   . Microalbuminuria   . Osteopenia   . Ovarian failure   . Tobacco use     Past Surgical History:  Procedure Laterality Date  . CATARACT EXTRACTION Bilateral 1980  . CESAREAN SECTION    . CORNEAL TRANSPLANT Left    [facility]-Treatment for blindness.    Social History   Tobacco Use  . Smoking status: Current Every Day Smoker    Packs/day: 0.25    Years: 46.00    Pack years: 11.50    Types: Cigarettes    Start date: 01/07/1985  . Smokeless tobacco: Never Used  . Tobacco comment: patient is down to 4/day.trying to quit  -used to smoke heavily in the past   Substance Use Topics  . Alcohol use: No    Alcohol/week: 0.0 standard drinks     Current Outpatient Medications:  .  albuterol (PROVENTIL HFA;VENTOLIN HFA) 108 (90 Base) MCG/ACT inhaler, Inhale 2 puffs into the lungs every 6 (six) hours as needed for wheezing or shortness of breath., Disp: 1 Inhaler, Rfl: 0 .  Alcohol Swabs (ALCOHOL PREP) PADS, , Disp: , Rfl:  .  aspirin 81 MG chewable tablet, Chew 1 tablet by mouth daily., Disp: , Rfl:  .  atorvastatin (LIPITOR) 40 MG tablet, Take 1 tablet (40 mg total) by mouth daily. In place of simvastatin, Disp: 90 tablet, Rfl: 1 .  benzonatate (TESSALON) 100 MG capsule, Take 1 capsule (100 mg total) by mouth 2 (two) times daily as needed for cough.,  Disp: 30 capsule, Rfl: 0 .  Blood Glucose Monitoring Suppl (ONE TOUCH ULTRA MINI) w/Device KIT, 1 each by Does not apply route daily., Disp: 1 each, Rfl: 0 .  Cholecalciferol (VITAMIN D) 2000 units CAPS, Take 1 capsule (2,000 Units total) by mouth daily. (Patient not taking: Reported on 05/23/2018), Disp: 30 capsule, Rfl: 12 .  empagliflozin (JARDIANCE) 25 MG TABS tablet, Take 25 mg by mouth daily., Disp: 90 tablet, Rfl: 1 .  folic acid (FOLVITE) 1 MG tablet, Take by mouth., Disp: , Rfl:  .  gabapentin (NEURONTIN) 300 MG capsule, Take 1 capsule (300 mg total) by mouth 3 (three) times daily., Disp: 270 capsule, Rfl: 1 .   glucose blood test strip, , Disp: , Rfl:  .  guaifenesin (HUMIBID E) 400 MG TABS tablet, Take 1 tablet (400 mg total) by mouth every 6 (six) hours., Disp: 30 tablet, Rfl: 0 .  HYDROcodone-acetaminophen (NORCO/VICODIN) 5-325 MG tablet, Take 2 tablets by mouth 2 (two) times daily as needed for moderate pain., Disp: 30 tablet, Rfl: 0 .  Lancet Devices (SIMPLE DIAGNOSTICS LANCING DEV) MISC, , Disp: , Rfl:  .  lisinopril (PRINIVIL,ZESTRIL) 5 MG tablet, Take 1 tablet (5 mg total) by mouth daily., Disp: 90 tablet, Rfl: 1 .  metFORMIN (GLUCOPHAGE-XR) 750 MG 24 hr tablet, Take 2 tablets (1,500 mg total) by mouth daily with breakfast., Disp: 180 tablet, Rfl: 1 .  methotrexate (RHEUMATREX) 2.5 MG tablet, Take 15 mg by mouth once a week. , Disp: , Rfl:  .  triamcinolone cream (KENALOG) 0.1 %, TRIAMCINOLONE ACETONIDE, 0.1% (External Cream)  use per dermatologist. for 0 days  Quantity: 1.00;  Refills: 0   Ordered :15-Jun-2010  Steele Sizer MD;  Started 02-Apr-2008 Active Comments: DX: 724.2, Disp: 30 g, Rfl: 3 .  umeclidinium-vilanterol (ANORO ELLIPTA) 62.5-25 MCG/INH AEPB, Inhale 1 puff into the lungs daily., Disp: 180 each, Rfl: 2  Allergies  Allergen Reactions  . Penicillins Rash    ROS   No other specific complaints in a complete review of systems (except as listed in HPI above).  Objective  Vitals:   05/23/18 0935 05/23/18 1042  BP: 104/64   Pulse: (!) 103 88  Resp: 16   Temp: 98.3 F (36.8 C)   TempSrc: Oral   SpO2: 96%   Weight: 174 lb 6.4 oz (79.1 kg)   Height: 4' 10"  (1.473 m)     Body mass index is 36.45 kg/m.  Nursing Note and Vital Signs reviewed.  Physical Exam Constitutional:      Appearance: Normal appearance.  HENT:     Head: Normocephalic and atraumatic.     Right Ear: External ear normal.     Left Ear: External ear normal.     Nose: Nose normal.     Mouth/Throat:     Pharynx: No oropharyngeal exudate.  Eyes:     General:        Right eye: No discharge.         Left eye: No discharge.     Conjunctiva/sclera: Conjunctivae normal.  Neck:     Musculoskeletal: Normal range of motion.  Cardiovascular:     Rate and Rhythm: Normal rate and regular rhythm.  Pulmonary:     Effort: Pulmonary effort is normal.     Breath sounds: Normal breath sounds.  Lymphadenopathy:     Cervical: No cervical adenopathy.  Skin:    General: Skin is warm and dry.  Neurological:     Mental Status: She is alert.  Psychiatric:        Mood and Affect: Mood normal.        Judgment: Judgment normal.      No results found for this or any previous visit (from the past 48 hour(s)).  Assessment & Plan 1. Cough Improving. - Please continue to drink lots of water and take your musinex (guaifenesin) twice a day and cough medicine as needed. This cough can last up to 3 weeks total but should be improving every day and should develop into a dry cough. Continue to work on cutting down on smoking so you can quit. Make sure to take good deep breaths try to breathe in for 4 seconds hold it for 4 seconds and slowly breath out for 4 seconds as often as you can. - Please return if symptoms or are not improving.     -Red flags and when to present for emergency care or RTC including fever >101.30F, chest pain, shortness of breath, new/worsening/un-resolving symptoms,  reviewed with patient at time of visit. Follow up and care instructions discussed and provided in AVS.

## 2018-05-23 NOTE — Patient Instructions (Signed)
-   Please continue to drink lots of water and take your musinex (guaifenesin) twice a day and cough medicine as needed. This cough can last up to 3 weeks total but should be improving every day and should develop into a dry cough. Continue to work on cutting down on smoking so you can quit. Make sure to take good deep breaths try to breathe in for 4 seconds hold it for 4 seconds and slowly breath out for 4 seconds as often as you can. - Please return if symptoms or are not improving.

## 2018-05-23 NOTE — Progress Notes (Signed)
Subjective:   Julie Wall is a 62 y.o. female who presents for Medicare Annual (Subsequent) preventive examination.  Review of Systems:   Cardiac Risk Factors include: advanced age (>39mn, >>65women);diabetes mellitus;dyslipidemia;hypertension;obesity (BMI >30kg/m2);smoking/ tobacco exposure     Objective:     Vitals: BP 104/64 (BP Location: Left Arm, Patient Position: Sitting, Cuff Size: Normal)   Pulse (!) 103   Temp 98.3 F (36.8 C) (Oral)   Resp 16   Ht 4' 10"  (1.473 m)   Wt 174 lb 6.4 oz (79.1 kg)   SpO2 96%   BMI 36.45 kg/m   Body mass index is 36.45 kg/m.  Advanced Directives 05/23/2018 03/16/2017 12/05/2016 06/14/2016 03/14/2016 02/09/2016 12/13/2015  Does Patient Have a Medical Advance Directive? No No No No No No No  Would patient like information on creating a medical advance directive? No - Patient declined - - - No - patient declined information No - patient declined information -    Tobacco Social History   Tobacco Use  Smoking Status Current Every Day Smoker  . Packs/day: 0.25  . Years: 46.00  . Pack years: 11.50  . Types: Cigarettes  . Start date: 01/07/1985  Smokeless Tobacco Never Used  Tobacco Comment   patient is down to 4/day.trying to quit  -used to smoke heavily in the past      Ready to quit: Not Answered Counseling given: Not Answered Comment: patient is down to 4/day.trying to quit  -used to smoke heavily in the past    Clinical Intake:  Pre-visit preparation completed: Yes  Pain : No/denies pain     Nutritional Status: BMI > 30  Obese Nutritional Risks: None Diabetes: Yes CBG done?: No Did pt. bring in CBG monitor from home?: No   Nutrition Risk Assessment:  Has the patient had any N/V/D within the last 2 months?  No  Does the patient have any non-healing wounds?  No  Has the patient had any unintentional weight loss or weight gain?  No   Diabetes:  Is the patient diabetic?  Yes  If diabetic, was a CBG obtained today?  No    Did the patient bring in their glucometer from home?  No  How often do you monitor your CBG's? Daily - primarily fasting in morning but varies.   Financial Strains and Diabetes Management:  Are you having any financial strains with the device, your supplies or your medication? No .  Does the patient want to be seen by Chronic Care Management for management of their diabetes?  No  Would the patient like to be referred to a Nutritionist or for Diabetic Management?  No   Diabetic Exams:  Diabetic Eye Exam: Completed 03/15/18.   Diabetic Foot Exam: Completed 10/19/17.   How often do you need to have someone help you when you read instructions, pamphlets, or other written materials from your doctor or pharmacy?: 1 - Never What is the last grade level you completed in school?: 12th grade  Interpreter Needed?: No  Information entered by :: KClemetine MarkerLPN  Past Medical History:  Diagnosis Date  . Allergy   . ASCUS favor benign 09/2013   negative HPV  . Atopic dermatitis    [facility] . Chronic low back pain   . COPD ([place] obstructive pulmonary disease) ([place]   . Decreased exercise tolerance   . Dental caries   . Diabetes mellitus without complication ([place]   . GERD (gastroesophageal reflux disease)   .  Hyperlipidemia   . Hypertension   . Lumbosacral neuritis   . Microalbuminuria   . Osteopenia   . Ovarian failure   . Tobacco use    Past Surgical History:  Procedure Laterality Date  . CATARACT EXTRACTION Bilateral 1980  . CESAREAN SECTION    . CORNEAL TRANSPLANT Left    [facility]-Treatment for blindness.   Family History  Problem Relation Age of Onset  . Diabetes Mother   . Hypertension Mother   . Pancreatic cancer Mother   . Alcohol abuse Father   . Arthritis/Rheumatoid Sister   . Diabetes Sister   . Kidney disease Sister   . Diabetes Brother   . Breast cancer Neg Hx    Social History   Socioeconomic History  . Marital status: Single    Spouse name:  Not on file  . Number of children: 3  . Years of education: Not on file  . Highest education level: High school graduate  Occupational History  . Not on file  Social Needs  . Financial resource strain: Not very hard  . Food insecurity:    Worry: Sometimes true    Inability: Never true  . Transportation needs:    Medical: No    Non-medical: No  Tobacco Use  . Smoking status: Current Every Day Smoker    Packs/day: 0.25    Years: 46.00    Pack years: 11.50    Types: Cigarettes    Start date: 01/07/1985  . Smokeless tobacco: Never Used  . Tobacco comment: patient is down to 4/day.trying to quit  -used to smoke heavily in the past   Substance and Sexual Activity  . Alcohol use: No    Alcohol/week: 0.0 standard drinks  . Drug use: No  . Sexual activity: Not Currently  Lifestyle  . Physical activity:    Days per week: 2 days    Minutes per session: 20 min  . Stress: Not at all  Relationships  . Social connections:    Talks on phone: More than three times a week    Gets together: More than three times a week    Attends religious service: More than 4 times per year    Active member of club or organization: Yes    Attends meetings of clubs or organizations: More than 4 times per year    Relationship status: Never married  Other Topics Concern  . Not on file  Social History Narrative   Disabled from severe atopic dermatitis   Has 3 children   Lives alone but very connect with family ( sees sister and her children daily)   Also belongs to a church    Outpatient Encounter Medications as of 05/23/2018  Medication Sig  . albuterol (PROVENTIL HFA;VENTOLIN HFA) 108 (90 Base) MCG/ACT inhaler Inhale 2 puffs into the lungs every 6 (six) hours as needed for wheezing or shortness of breath.  . Alcohol Swabs (ALCOHOL PREP) PADS   . aspirin 81 MG chewable tablet Chew 1 tablet by mouth daily.  Marland Kitchen atorvastatin (LIPITOR) 40 MG tablet Take 1 tablet (40 mg total) by mouth daily. In place of  simvastatin  . benzonatate (TESSALON) 100 MG capsule Take 1 capsule (100 mg total) by mouth 2 (two) times daily as needed for cough.  . Blood Glucose Monitoring Suppl (ONE TOUCH ULTRA MINI) w/Device KIT 1 each by Does not apply route daily.  . empagliflozin (JARDIANCE) 25 MG TABS tablet Take 25 mg by mouth daily.  . folic acid (FOLVITE)  1 MG tablet Take by mouth.  . gabapentin (NEURONTIN) 300 MG capsule Take 1 capsule (300 mg total) by mouth 3 (three) times daily.  Marland Kitchen glucose blood test strip   . guaifenesin (HUMIBID E) 400 MG TABS tablet Take 1 tablet (400 mg total) by mouth every 6 (six) hours.  Marland Kitchen HYDROcodone-acetaminophen (NORCO/VICODIN) 5-325 MG tablet Take 2 tablets by mouth 2 (two) times daily as needed for moderate pain.  Elmore Guise Devices (SIMPLE DIAGNOSTICS LANCING DEV) MISC   . lisinopril (PRINIVIL,ZESTRIL) 5 MG tablet Take 1 tablet (5 mg total) by mouth daily.  . metFORMIN (GLUCOPHAGE-XR) 750 MG 24 hr tablet Take 2 tablets (1,500 mg total) by mouth daily with breakfast.  . methotrexate (RHEUMATREX) 2.5 MG tablet Take 15 mg by mouth once a week.   . triamcinolone cream (KENALOG) 0.1 % TRIAMCINOLONE ACETONIDE, 0.1% (External Cream)  use per dermatologist. for 0 days  Quantity: 1.00;  Refills: 0   Ordered :15-Jun-2010  Steele Sizer MD;  Started 02-Apr-2008 Active Comments: DX: 724.2  . umeclidinium-vilanterol (ANORO ELLIPTA) 62.5-25 MCG/INH AEPB Inhale 1 puff into the lungs daily.  . Cholecalciferol (VITAMIN D) 2000 units CAPS Take 1 capsule (2,000 Units total) by mouth daily. (Patient not taking: Reported on 05/23/2018)   No facility-administered encounter medications on file as of 05/23/2018.     Activities of Daily Living In your present state of health, do you have any difficulty performing the following activities: 05/23/2018 05/20/2018  Hearing? N N  Comment declines hearing aids -  Vision? N Y  Comment wears reading glasses -  Difficulty concentrating or making  decisions? N N  Walking or climbing stairs? N Y  Dressing or bathing? N N  Doing errands, shopping? N Y  Comment - Does not Physiological scientist and eating ? N -  Using the Toilet? N -  In the past six months, have you accidently leaked urine? N -  Do you have problems with loss of bowel control? N -  Managing your Medications? N -  Managing your Finances? N -  Housekeeping or managing your Housekeeping? N -  Some recent data might be hidden    Patient Care Team: Steele Sizer, MD as PCP - General (Family Medicine) Ernesto Rutherford, MD as Referring Physician (Dermatology) Vladimir Crofts, MD as Consulting Physician (Neurology) Yolonda Kida, MD as Consulting Physician (Cardiology)    Assessment:   This is a routine wellness examination for Brittney.  Exercise Activities and Dietary recommendations Current Exercise Habits: Home exercise routine, Type of exercise: walking, Time (Minutes): 15, Frequency (Times/Week): 2, Weekly Exercise (Minutes/Week): 30, Intensity: Mild, Exercise limited by: None identified  Goals    . Quit Smoking     Pt would like to stop smoking completely.       Fall Risk Fall Risk  05/23/2018 05/20/2018 04/19/2018 03/21/2018 03/21/2018  Falls in the past year? 1 1 1  No No  Number falls in past yr: 1 1 1  - -  Injury with Fall? 0 1 1 - -  Comment - - Hip Pain-Leg will give out on her - -  Risk Factor Category  - - - - -  Risk for fall due to : History of fall(s);Impaired balance/gait - - - -  Risk for fall due to: Comment pt states right leg gave out and c/o cramps in legs and muscles and restless legs - - - -  Follow up Falls evaluation completed;Falls prevention discussed - - - -  FALL RISK PREVENTION PERTAINING TO THE HOME:  Any stairs in or around the home WITH handrails? No  Home free of loose throw rugs in walkways, pet beds, electrical cords, etc? Yes  Adequate lighting in your home to reduce risk of falls? Yes   ASSISTIVE DEVICES  UTILIZED TO PREVENT FALLS:  Life alert? No  Use of a cane, walker or w/c? No  Grab bars in the bathroom? Yes  Shower chair or bench in shower? No  Elevated toilet seat or a handicapped toilet? No   DME ORDERS:  DME order needed?  No   TIMED UP AND GO:  Was the test performed? Yes .  Length of time to ambulate 10 feet: 5 sec.   GAIT:  Appearance of gait: Gait stead-fast and without the use of an assistive device.  Education: Fall risk prevention has been discussed.  Intervention(s) required? No   Depression Screen PHQ 2/9 Scores 05/23/2018 05/20/2018 04/19/2018 01/16/2018  PHQ - 2 Score 0 0 0 0  PHQ- 9 Score 6 5 - 0     Cognitive Function     6CIT Screen 05/23/2018  What Year? 0 points  What month? 0 points  What time? 0 points  Count back from 20 0 points  Months in reverse 0 points  Repeat phrase 4 points  Total Score 4    Immunization History  Administered Date(s) Administered  . Influenza Split 04/09/2012  . Influenza, Seasonal, Injecte, Preservative Fre 02/17/2011  . Influenza,inj,Quad PF,6+ Mos 07/16/2013, 03/23/2014, 04/01/2015, 03/14/2016, 03/16/2017, 02/19/2018  . Influenza-Unspecified 03/23/2014  . Pneumococcal Conjugate-13 04/01/2015  . Pneumococcal Polysaccharide-23 01/26/2010  . Tdap 01/25/2012  . Zoster 01/08/2015    Qualifies for Shingles Vaccine? Yes  Zostavax completed 2016. Due for Shingrix. Education has been provided regarding the importance of this vaccine. Pt has been advised to call insurance company to determine out of pocket expense. Advised may also receive vaccine at local pharmacy or Health Dept. Verbalized acceptance and understanding.  Tdap: Up to date  Flu Vaccine: Up to date  Pneumococcal Vaccine: Up to date   Screening Tests Health Maintenance  Topic Date Due  . PAP SMEAR-Modifier  04/23/2018  . HEMOGLOBIN A1C  10/18/2018  . FOOT EXAM  10/20/2018  . OPHTHALMOLOGY EXAM  03/16/2019  . MAMMOGRAM  11/06/2019  .  COLONOSCOPY  07/05/2020  . TETANUS/TDAP  01/24/2022  . INFLUENZA VACCINE  Completed  . PNEUMOCOCCAL POLYSACCHARIDE VACCINE AGE 8-64 HIGH RISK  Completed  . Hepatitis C Screening  Completed  . HIV Screening  Completed    Cancer Screenings:  Colorectal Screening: Completed 07/05/2010. Repeat every 10 years.   Mammogram: Completed 11/05/17. Repeat every year.   Bone Density: due age 62  Lung Cancer Screening: (Low Dose CT Chest recommended if Age 39-80 years, 30 pack-year currently smoking OR have quit w/in 15years.) does qualify. Done 05/10/18.  Additional Screening:  Hepatitis C Screening: does qualify; Completed 01/27/13  Vision Screening: Recommended annual ophthalmology exams for early detection of glaucoma and other disorders of the eye. Is the patient up to date with their annual eye exam?  Yes  Who is the provider or what is the name of the office in which the pt attends annual eye exams? [place] Screening: Recommended annual dental exams for proper oral hygiene  Community Resource Referral:  CRR required this visit?  No      Plan:    I have personally reviewed and addressed the Medicare Annual Wellness questionnaire  and have noted the following in the patient's chart:  A. Medical and social history B. Use of alcohol, tobacco or illicit drugs  C. Current medications and supplements D. Functional ability and status E.  Nutritional status F.  Physical activity G. Advance directives H. List of other physicians I.  Hospitalizations, surgeries, and ER visits in previous 12 months J.  [place] such as hearing and vision if needed, cognitive and depression L. Referrals and appointments   In addition, I have reviewed and discussed with patient certain preventive protocols, quality metrics, and best practice recommendations. A written personalized care plan for preventive services as well as general preventive health recommendations were provided  to patient.   Signed,  [name], LPN Nurse Health Advisor   Nurse Notes:

## 2018-06-17 DIAGNOSIS — G5712 Meralgia paresthetica, left lower limb: Secondary | ICD-10-CM | POA: Diagnosis not present

## 2018-06-17 DIAGNOSIS — G2581 Restless legs syndrome: Secondary | ICD-10-CM | POA: Diagnosis not present

## 2018-06-17 DIAGNOSIS — G5602 Carpal tunnel syndrome, left upper limb: Secondary | ICD-10-CM | POA: Diagnosis not present

## 2018-06-17 DIAGNOSIS — E1142 Type 2 diabetes mellitus with diabetic polyneuropathy: Secondary | ICD-10-CM | POA: Diagnosis not present

## 2018-06-21 DIAGNOSIS — L2089 Other atopic dermatitis: Secondary | ICD-10-CM | POA: Diagnosis not present

## 2018-06-21 DIAGNOSIS — Z79899 Other long term (current) drug therapy: Secondary | ICD-10-CM | POA: Diagnosis not present

## 2018-06-21 DIAGNOSIS — L659 Nonscarring hair loss, unspecified: Secondary | ICD-10-CM | POA: Diagnosis not present

## 2018-06-26 ENCOUNTER — Ambulatory Visit (INDEPENDENT_AMBULATORY_CARE_PROVIDER_SITE_OTHER): Payer: Medicare Other | Admitting: Family Medicine

## 2018-06-26 ENCOUNTER — Encounter: Payer: Self-pay | Admitting: Family Medicine

## 2018-06-26 ENCOUNTER — Other Ambulatory Visit: Payer: Self-pay | Admitting: Family Medicine

## 2018-06-26 VITALS — BP 120/76 | HR 94 | Temp 98.0°F | Resp 16 | Ht <= 58 in | Wt 172.8 lb

## 2018-06-26 DIAGNOSIS — E1129 Type 2 diabetes mellitus with other diabetic kidney complication: Secondary | ICD-10-CM

## 2018-06-26 DIAGNOSIS — E1142 Type 2 diabetes mellitus with diabetic polyneuropathy: Secondary | ICD-10-CM

## 2018-06-26 DIAGNOSIS — R059 Cough, unspecified: Secondary | ICD-10-CM

## 2018-06-26 DIAGNOSIS — R05 Cough: Secondary | ICD-10-CM | POA: Diagnosis not present

## 2018-06-26 DIAGNOSIS — J411 Mucopurulent chronic bronchitis: Secondary | ICD-10-CM | POA: Diagnosis not present

## 2018-06-26 DIAGNOSIS — H401131 Primary open-angle glaucoma, bilateral, mild stage: Secondary | ICD-10-CM | POA: Diagnosis not present

## 2018-06-26 LAB — HM DIABETES EYE EXAM

## 2018-06-26 MED ORDER — PROMETHAZINE-DM 6.25-15 MG/5ML PO SYRP: 5.0000 mL | ORAL_SOLUTION | Freq: Four times a day (QID) | ORAL | 0 refills | Status: DC | PRN

## 2018-06-26 MED ORDER — SIMPLE DIAGNOSTICS LANCING DEV MISC: 2 refills | Status: DC

## 2018-06-26 MED ORDER — GLUCOSE BLOOD VI STRP: ORAL_STRIP | 2 refills | Status: DC

## 2018-06-26 MED ORDER — ONETOUCH ULTRA MINI W/DEVICE KIT: 1.0000 | PACK | Freq: Every day | 0 refills | Status: DC

## 2018-06-26 MED ORDER — BENZONATATE 100 MG PO CAPS: 100.0000 mg | ORAL_CAPSULE | Freq: Two times a day (BID) | ORAL | 0 refills | Status: DC | PRN

## 2018-06-26 MED ORDER — ALBUTEROL SULFATE (2.5 MG/3ML) 0.083% IN NEBU: 2.5000 mg | INHALATION_SOLUTION | Freq: Once | RESPIRATORY_TRACT | Status: DC

## 2018-06-26 NOTE — Progress Notes (Signed)
Name: Julie Wall   MRN: [id number]    DOB: [birth year]   Date:06/26/2018       Progress Note  Subjective  Chief Complaint  Chief Complaint  Patient presents with  . Cough    for 2 days    HPI  Pt presents with concern for cough x2 days.  She reports some shortness of breath, purulent production with cough in the mornings, but otherwise it is dry.  She denies chest pain, fevers, nasal congestion, ear pain/pressure, sore throat.  Has not been taking mucinex, she has not been using her Anoro Ellipta daily. Has Chronic Bronchitis/COPD.   DM: Last A1C was 7.2%. Not checking BG's at home - needs test strips and lancets. Discussed how DM can contribute to complex infections, especially pneumonia. She verbalizes understanding. She has been taking her medications as prescribed.  Patient Active Problem List   Diagnosis Date Noted  . Morbid obesity ([place]) 06/26/2018  . Atherosclerosis of aorta ([place]) 05/04/2017  . Pap smear abnormality of cervix with ASCUS favoring benign 04/23/2017  . Vitamin D deficiency 12/14/2016  . Obesity (BMI 30.0-34.9) 12/13/2015  . Chronic bronchitis ([place]) 05/14/2015  . DM type 2 with diabetic peripheral neuropathy ([place]) 05/14/2015  . Positive H. pylori test 04/05/2015  . Rapid urease test for Helicobacter pylori infection positive 04/05/2015  . Umbilical hernia 28/36/6294  . Exomphalos 04/01/2015  . RLS (restless legs syndrome) 01/08/2015  . Type 2 diabetes mellitus with other diabetic kidney complication ([place]) 76/54/6503  . Neurogenic claudication 01/08/2015  . AD (atopic dermatitis) 12/28/2014  . Chronic LBP 12/28/2014  . Dyslipidemia 12/28/2014  . Gastroesophageal reflux disease without esophagitis 12/28/2014  . Microalbuminuria 12/28/2014  . Disorder of bone and cartilage 12/28/2014  . Neuralgia of left thigh 12/28/2014  . Perennial allergic rhinitis with seasonal variation 12/28/2014  . Tobacco abuse 12/28/2014  . Hypothyroidism 09/22/2005  .  Hypertension, benign 09/22/2005    Social History   Tobacco Use  . Smoking status: Current Every Day Smoker    Packs/day: 0.25    Years: 46.00    Pack years: 11.50    Types: Cigarettes    Start date: 01/07/1985  . Smokeless tobacco: Never Used  . Tobacco comment: patient is down to 4/day.trying to quit  -used to smoke heavily in the past   Substance Use Topics  . Alcohol use: No    Alcohol/week: 0.0 standard drinks     Current Outpatient Medications:  .  albuterol (PROVENTIL HFA;VENTOLIN HFA) 108 (90 Base) MCG/ACT inhaler, Inhale 2 puffs into the lungs every 6 (six) hours as needed for wheezing or shortness of breath., Disp: 1 Inhaler, Rfl: 0 .  Alcohol Swabs (ALCOHOL PREP) PADS, , Disp: , Rfl:  .  aspirin 81 MG chewable tablet, Chew 1 tablet by mouth daily., Disp: , Rfl:  .  atorvastatin (LIPITOR) 40 MG tablet, Take 1 tablet (40 mg total) by mouth daily. In place of simvastatin, Disp: 90 tablet, Rfl: 1 .  benzonatate (TESSALON) 100 MG capsule, Take 1 capsule (100 mg total) by mouth 2 (two) times daily as needed for cough., Disp: 30 capsule, Rfl: 0 .  Blood Glucose Monitoring Suppl (ONE TOUCH ULTRA MINI) w/Device KIT, 1 each by Does not apply route daily., Disp: 1 each, Rfl: 0 .  empagliflozin (JARDIANCE) 25 MG TABS tablet, Take 25 mg by mouth daily., Disp: 90 tablet, Rfl: 1 .  folic acid (FOLVITE) 1 MG tablet, Take by mouth., Disp: , Rfl:  .  gabapentin (  NEURONTIN) 300 MG capsule, Take 1 capsule (300 mg total) by mouth 3 (three) times daily., Disp: 270 capsule, Rfl: 1 .  glucose blood test strip, Check blood sugar once daily fasting, and up to 4 times daily PRN, Disp: 100 each, Rfl: 2 .  guaifenesin (HUMIBID E) 400 MG TABS tablet, Take 1 tablet (400 mg total) by mouth every 6 (six) hours., Disp: 30 tablet, Rfl: 0 .  HYDROcodone-acetaminophen (NORCO/VICODIN) 5-325 MG tablet, Take 2 tablets by mouth 2 (two) times daily as needed for moderate pain., Disp: 30 tablet, Rfl: 0 .  Lancet  Devices (SIMPLE DIAGNOSTICS LANCING DEV) MISC, Check blood sugar once daily fasting, and up to 4 times daily PRN., Disp: 100 each, Rfl: 2 .  lisinopril (PRINIVIL,ZESTRIL) 5 MG tablet, Take 1 tablet (5 mg total) by mouth daily., Disp: 90 tablet, Rfl: 1 .  metFORMIN (GLUCOPHAGE-XR) 750 MG 24 hr tablet, Take 2 tablets (1,500 mg total) by mouth daily with breakfast., Disp: 180 tablet, Rfl: 1 .  methotrexate (RHEUMATREX) 2.5 MG tablet, Take 15 mg by mouth once a week. , Disp: , Rfl:  .  triamcinolone cream (KENALOG) 0.1 %, TRIAMCINOLONE ACETONIDE, 0.1% (External Cream)  use per dermatologist. for 0 days  Quantity: 1.00;  Refills: 0   Ordered :15-Jun-2010  Steele Sizer MD;  Started 02-Apr-2008 Active Comments: DX: 724.2, Disp: 30 g, Rfl: 3 .  umeclidinium-vilanterol (ANORO ELLIPTA) 62.5-25 MCG/INH AEPB, Inhale 1 puff into the lungs daily., Disp: 180 each, Rfl: 2 .  Cholecalciferol (VITAMIN D) 2000 units CAPS, Take 1 capsule (2,000 Units total) by mouth daily. (Patient not taking: Reported on 05/23/2018), Disp: 30 capsule, Rfl: 12 .  promethazine-dextromethorphan (PROMETHAZINE-DM) 6.25-15 MG/5ML syrup, Take 5 mLs by mouth 4 (four) times daily as needed for cough., Disp: 118 mL, Rfl: 0  Current Facility-Administered Medications:  .  albuterol (PROVENTIL) (2.5 MG/3ML) 0.083% nebulizer solution 2.5 mg, 2.5 mg, Nebulization, Once, Hubbard Hartshorn, FNP  Allergies  Allergen Reactions  . Penicillins Rash    I personally reviewed active problem list, medication list, allergies, notes from last encounter, lab results with the patient/caregiver today.  ROS  Ten systems reviewed and is negative except as mentioned in HPI.  Objective  Vitals:   06/26/18 1134 06/26/18 1210  BP: 120/76   Pulse: 94 94  Resp: 16   Temp: 98 F (36.7 C)   TempSrc: Oral   SpO2: 90%   Weight: 172 lb 12.8 oz (78.4 kg)   Height: '4\' 10"'$  (1.473 m)    Body mass index is 36.12 kg/m.  Nursing Note and Vital Signs  reviewed.  Physical Exam  Constitutional: Patient appears well-developed and well-nourished. Obese No distress.  HEENT: head atraumatic, normocephalic, pupils equal and reactive to light, Bilateral TM's without erythema or effusion,  bilateral maxillary and frontal sinuses are non-tender, neck supple without lymphadenopathy, throat within normal limits - no erythema or exudate, no tonsillar swelling Cardiovascular: Normal rate, regular rhythm and normal heart sounds.  No murmur heard. No BLE edema. Pulmonary/Chest: Effort normal and breath sounds clear bilaterally. No respiratory distress. Dry cough throughout visit until albuterol nebulizer is administered.   Psychiatric: Patient has a normal mood and affect. behavior is normal. Judgment and thought content normal.   No results found for this or any previous visit (from the past 72 hour(s)).  Assessment & Plan  1. Cough - Significant improvement in cough after in-office albuterol neb.  SpO2 improved to 94%. - benzonatate (TESSALON) 100 MG capsule; Take 1  capsule (100 mg total) by mouth 2 (two) times daily as needed for cough.  Dispense: 30 capsule; Refill: 0 - promethazine-dextromethorphan (PROMETHAZINE-DM) 6.25-15 MG/5ML syrup; Take 5 mLs by mouth 4 (four) times daily as needed for cough.  Dispense: 118 mL; Refill: 0 - albuterol (PROVENTIL) (2.5 MG/3ML) 0.083% nebulizer solution 2.5 mg  2. DM type 2 with diabetic peripheral neuropathy (HCC) - Discussed sick day care in detail. - Lancet Devices (SIMPLE DIAGNOSTICS LANCING DEV) MISC; Check blood sugar once daily fasting, and up to 4 times daily PRN.  Dispense: 100 each; Refill: 2 - glucose blood test strip; Check blood sugar once daily fasting, and up to 4 times daily PRN  Dispense: 100 each; Refill: 2  3. Mucopurulent chronic bronchitis (HCC) - albuterol (PROVENTIL) (2.5 MG/3ML) 0.083% nebulizer solution 2.5 mg  -Red flags and when to present for emergency care or RTC including fever  >101.78F, chest pain, shortness of breath, new/worsening/un-resolving symptoms, reviewed with patient at time of visit. Follow up and care instructions discussed and provided in AVS. - Return in about 2 days (around 06/28/2018), or if symptoms worsen or fail to improve.

## 2018-06-26 NOTE — Telephone Encounter (Signed)
Refill request for diabetic medication:   One touch ultra device kit mini  Last office visit pertaining to diabetes: 04/19/2018   Lab Results  Component Value Date   HGBA1C 7.2 (A) 04/19/2018    Follow-ups on file. 07/22/2018

## 2018-06-26 NOTE — Telephone Encounter (Signed)
Copied from [place] [id number]. Topic: Quick Communication - Rx Refill/Question >> Jun 26, 2018 12:24 PM Alanda Slim E wrote: Medication: Blood Glucose Monitoring Suppl (ONE TOUCH ULTRA MINI) w/Device KIT  And Lansits  Has the patient contacted their pharmacy? Yes (Pharmacy called and needs Lansits and the meter to be sent it)    Preferred Pharmacy (with phone number or street name): [facility] [address] [place], [place] - [place] [address] Phone: [phone] Fax: [phone]    Agent: Please be advised that RX refills may take up to 3 business days. We ask that you follow-up with your pharmacy.

## 2018-07-01 ENCOUNTER — Ambulatory Visit (INDEPENDENT_AMBULATORY_CARE_PROVIDER_SITE_OTHER): Payer: Medicare Other | Admitting: Family Medicine

## 2018-07-01 ENCOUNTER — Ambulatory Visit
Admission: RE | Admit: 2018-07-01 | Discharge: 2018-07-01 | Disposition: A | Discharge status: Home / Self Care | Payer: Medicare Other | Source: Ambulatory Visit | Attending: Family Medicine | Admitting: Family Medicine

## 2018-07-01 ENCOUNTER — Ambulatory Visit
Admission: RE | Admit: 2018-07-01 | Discharge: 2018-07-01 | Disposition: A | Discharge status: Home / Self Care | Payer: Medicare Other | Attending: Family Medicine | Admitting: Family Medicine

## 2018-07-01 ENCOUNTER — Encounter: Payer: Self-pay | Admitting: Family Medicine

## 2018-07-01 VITALS — BP 112/72 | HR 88 | Temp 98.8°F | Resp 20 | Ht <= 58 in | Wt 167.8 lb

## 2018-07-01 DIAGNOSIS — R05 Cough: Secondary | ICD-10-CM | POA: Insufficient documentation

## 2018-07-01 DIAGNOSIS — J441 Chronic obstructive pulmonary disease with (acute) exacerbation: Secondary | ICD-10-CM | POA: Insufficient documentation

## 2018-07-01 DIAGNOSIS — R809 Proteinuria, unspecified: Secondary | ICD-10-CM | POA: Diagnosis not present

## 2018-07-01 DIAGNOSIS — R059 Cough, unspecified: Secondary | ICD-10-CM

## 2018-07-01 DIAGNOSIS — E1129 Type 2 diabetes mellitus with other diabetic kidney complication: Secondary | ICD-10-CM

## 2018-07-01 MED ORDER — LOSARTAN POTASSIUM 25 MG PO TABS: 25.0000 mg | ORAL_TABLET | Freq: Every day | ORAL | 0 refills | Status: DC

## 2018-07-01 MED ORDER — PREDNISONE 10 MG PO TABS: 10.0000 mg | ORAL_TABLET | Freq: Every day | ORAL | 0 refills | Status: DC

## 2018-07-01 MED ORDER — IPRATROPIUM-ALBUTEROL 0.5-2.5 (3) MG/3ML IN SOLN
3.0000 mL | Freq: Once | RESPIRATORY_TRACT | Status: AC
  Administered 2018-07-01: 3 mL via RESPIRATORY_TRACT

## 2018-07-01 MED ORDER — LEVOFLOXACIN 500 MG PO TABS: 500.0000 mg | ORAL_TABLET | Freq: Every day | ORAL | 0 refills | Status: DC

## 2018-07-01 NOTE — Progress Notes (Signed)
Name: Julie Wall   MRN: [id number]    DOB: 1955/09/14   Date:07/01/2018       Progress Note  Subjective  Chief Complaint  Chief Complaint  Patient presents with  . Cough    Came to see Raquel Sarna on the 22th and was given cough medication and nebulizer which didn't help-still coughing now she has yellow and green phlegm  . Fever    Has been as high as 102.8 at home-has been taking Tylenol    HPI  COPD flare: she was seen last week by Raelyn Ensign NP with a cough, she was given tessalon perles and reassurance, however that evening she had a fever. She states since last week she is coughing more, productive, associated with SOB, lack of appetite and has lost 5 lbs in 5 days. She has been able to drink fluids. Feeling tired.   DM with microalbuminuria: she takes ACE but has a daily cough since Dec that got worse recently and we will change to losartan . Explained importance of following diabetic diet and sick, especially when taking prednisone. Glucose has been in low 100's recently.    Patient Active Problem List   Diagnosis Date Noted  . Morbid obesity ([place]) 06/26/2018  . Atherosclerosis of aorta ([place]) 05/04/2017  . Pap smear abnormality of cervix with ASCUS favoring benign 04/23/2017  . Vitamin D deficiency 12/14/2016  . Obesity (BMI 30.0-34.9) 12/13/2015  . Chronic bronchitis ([place]) 05/14/2015  . DM type 2 with diabetic peripheral neuropathy ([place]) 05/14/2015  . Positive H. pylori test 04/05/2015  . Rapid urease test for Helicobacter pylori infection positive 04/05/2015  . Umbilical hernia 82/57/4935  . Exomphalos 04/01/2015  . RLS (restless legs syndrome) 01/08/2015  . Type 2 diabetes mellitus with other diabetic kidney complication ([place]) 52/17/4715  . Neurogenic claudication 01/08/2015  . AD (atopic dermatitis) 12/28/2014  . Chronic LBP 12/28/2014  . Dyslipidemia 12/28/2014  . Gastroesophageal reflux disease without esophagitis 12/28/2014  . Microalbuminuria 12/28/2014  .  Disorder of bone and cartilage 12/28/2014  . Neuralgia of left thigh 12/28/2014  . Perennial allergic rhinitis with seasonal variation 12/28/2014  . Tobacco abuse 12/28/2014  . Hypothyroidism 09/22/2005  . Hypertension, benign 09/22/2005    Past Surgical History:  Procedure Laterality Date  . CATARACT EXTRACTION Bilateral 1980  . CESAREAN SECTION    . CORNEAL TRANSPLANT Left    [facility]-Treatment for blindness.    Family History  Problem Relation Age of Onset  . Diabetes Mother   . Hypertension Mother   . Pancreatic cancer Mother   . Alcohol abuse Father   . Arthritis/Rheumatoid Sister   . Diabetes Sister   . Kidney disease Sister   . Diabetes Brother   . Breast cancer Neg Hx     Social History   Socioeconomic History  . Marital status: Single    Spouse name: Not on file  . Number of children: 3  . Years of education: Not on file  . Highest education level: High school graduate  Occupational History  . Not on file  Social Needs  . Financial resource strain: Not very hard  . Food insecurity:    Worry: Sometimes true    Inability: Never true  . Transportation needs:    Medical: No    Non-medical: No  Tobacco Use  . Smoking status: Current Every Day Smoker    Packs/day: 0.25    Years: 46.00    Pack years: 11.50    Types: Cigarettes  Start date: 01/07/1985  . Smokeless tobacco: Never Used  . Tobacco comment: patient is down to 4/day.trying to quit  -used to smoke heavily in the past   Substance and Sexual Activity  . Alcohol use: No    Alcohol/week: 0.0 standard drinks  . Drug use: No  . Sexual activity: Not Currently  Lifestyle  . Physical activity:    Days per week: 2 days    Minutes per session: 20 min  . Stress: Not at all  Relationships  . Social connections:    Talks on phone: More than three times a week    Gets together: More than three times a week    Attends religious service: More than 4 times per year    Active member of club or organization:  Yes    Attends meetings of clubs or organizations: More than 4 times per year    Relationship status: Never married  . Intimate partner violence:    Fear of current or ex partner: No    Emotionally abused: No    Physically abused: No    Forced sexual activity: No  Other Topics Concern  . Not on file  Social History Narrative   Disabled from severe atopic dermatitis   Has 3 children   Lives alone but very connect with family ( sees sister and her children daily)   Also belongs to a church     Current Outpatient Medications:  .  albuterol (PROVENTIL HFA;VENTOLIN HFA) 108 (90 Base) MCG/ACT inhaler, Inhale 2 puffs into the lungs every 6 (six) hours as needed for wheezing or shortness of breath., Disp: 1 Inhaler, Rfl: 0 .  Alcohol Swabs (ALCOHOL PREP) PADS, , Disp: , Rfl:  .  aspirin 81 MG chewable tablet, Chew 1 tablet by mouth daily., Disp: , Rfl:  .  atorvastatin (LIPITOR) 40 MG tablet, Take 1 tablet (40 mg total) by mouth daily. In place of simvastatin, Disp: 90 tablet, Rfl: 1 .  benzonatate (TESSALON) 100 MG capsule, Take 1 capsule (100 mg total) by mouth 2 (two) times daily as needed for cough., Disp: 30 capsule, Rfl: 0 .  Blood Glucose Monitoring Suppl (ONE TOUCH ULTRA MINI) w/Device KIT, 1 each by Does not apply route daily., Disp: 1 each, Rfl: 0 .  empagliflozin (JARDIANCE) 25 MG TABS tablet, Take 25 mg by mouth daily., Disp: 90 tablet, Rfl: 1 .  folic acid (FOLVITE) 1 MG tablet, Take by mouth., Disp: , Rfl:  .  gabapentin (NEURONTIN) 300 MG capsule, Take 1 capsule (300 mg total) by mouth 3 (three) times daily., Disp: 270 capsule, Rfl: 1 .  glucose blood test strip, Check blood sugar once daily fasting, and up to 4 times daily PRN, Disp: 100 each, Rfl: 2 .  guaifenesin (HUMIBID E) 400 MG TABS tablet, Take 1 tablet (400 mg total) by mouth every 6 (six) hours., Disp: 30 tablet, Rfl: 0 .  HYDROcodone-acetaminophen (NORCO/VICODIN) 5-325 MG tablet, Take 2 tablets by mouth 2 (two) times  daily as needed for moderate pain., Disp: 30 tablet, Rfl: 0 .  Lancet Devices (SIMPLE DIAGNOSTICS LANCING DEV) MISC, Check blood sugar once daily fasting, and up to 4 times daily PRN., Disp: 100 each, Rfl: 2 .  metFORMIN (GLUCOPHAGE-XR) 750 MG 24 hr tablet, Take 2 tablets (1,500 mg total) by mouth daily with breakfast., Disp: 180 tablet, Rfl: 1 .  methotrexate (RHEUMATREX) 2.5 MG tablet, Take 15 mg by mouth once a week. , Disp: , Rfl:  .  promethazine-dextromethorphan (  PROMETHAZINE-DM) 6.25-15 MG/5ML syrup, Take 5 mLs by mouth 4 (four) times daily as needed for cough., Disp: 118 mL, Rfl: 0 .  triamcinolone cream (KENALOG) 0.1 %, TRIAMCINOLONE ACETONIDE, 0.1% (External Cream)  use per dermatologist. for 0 days  Quantity: 1.00;  Refills: 0   Ordered :15-Jun-2010  Steele Sizer MD;  Started 02-Apr-2008 Active Comments: DX: 724.2, Disp: 30 g, Rfl: 3 .  umeclidinium-vilanterol (ANORO ELLIPTA) 62.5-25 MCG/INH AEPB, Inhale 1 puff into the lungs daily., Disp: 180 each, Rfl: 2 .  Cholecalciferol (VITAMIN D) 2000 units CAPS, Take 1 capsule (2,000 Units total) by mouth daily. (Patient not taking: Reported on 07/01/2018), Disp: 30 capsule, Rfl: 12 .  levofloxacin (LEVAQUIN) 500 MG tablet, Take 1 tablet (500 mg total) by mouth daily., Disp: 7 tablet, Rfl: 0 .  losartan (COZAAR) 25 MG tablet, Take 1 tablet (25 mg total) by mouth daily. In place of lisinopril, Disp: 90 tablet, Rfl: 0 .  predniSONE (DELTASONE) 10 MG tablet, Take 1 tablet (10 mg total) by mouth daily with breakfast., Disp: 10 tablet, Rfl: 0  Current Facility-Administered Medications:  .  albuterol (PROVENTIL) (2.5 MG/3ML) 0.083% nebulizer solution 2.5 mg, 2.5 mg, Nebulization, Once, Hubbard Hartshorn, FNP  Allergies  Allergen Reactions  . Penicillins Rash    I personally reviewed active problem list, medication list, allergies, family history, social history with the patient/caregiver today.   ROS  Constitutional: Negative for fever, positive  for  weight change.  Respiratory: Positive  for cough and shortness of breath.   Cardiovascular: Negative for chest pain or palpitations.  Gastrointestinal: Negative for abdominal pain, no bowel changes.  Musculoskeletal: Negative for gait problem or joint swelling.  Skin: Negative for rash.  Neurological: positive  for dizziness but no  headache.  No other specific complaints in a complete review of systems (except as listed in HPI above).  Objective  Vitals:   07/01/18 1140  BP: 112/72  Pulse: 88  Resp: 20  Temp: 98.8 F (37.1 C)  TempSrc: Oral  SpO2: 96%  Weight: 167 lb 12.8 oz (76.1 kg)  Height: 4' 10"  (1.473 m)    Body mass index is 35.07 kg/m.  Physical Exam  Constitutional: Patient appears well-developed and well-nourished. Obese  No distress.  HEENT: head atraumatic, normocephalic, pupils equal and reactive to light, neck supple, throat within normal limits Cardiovascular: Normal rate, regular rhythm and normal heart sounds.  No murmur heard. No BLE edema. Pulmonary/Chest: Effort normal , she has rhonchi bilaterally, no wheezing, coughing constantly during the visit . After neb treatment mild wheezing, rhonchi improved  Abdominal: Soft.  There is no tenderness. Psychiatric: Patient has a normal mood and affect. behavior is normal. Judgment and thought content normal.  Recent Results (from the past 2160 hour(s))  POCT HgB A1C     Status: Abnormal   Collection Time: 04/19/18 11:35 AM  Result Value Ref Range   Hemoglobin A1C     HbA1c POC (<> result, manual entry)     HbA1c, POC (prediabetic range)     HbA1c, POC (controlled diabetic range) 7.2 (A) 0.0 - 7.0 %  HM DIABETES EYE EXAM     Status: None   Collection Time: 06/26/18 12:00 AM  Result Value Ref Range   HM Diabetic Eye Exam No Retinopathy No Retinopathy     PHQ2/9: Depression screen [facility] 2/9 06/26/2018 05/23/2018 05/20/2018 04/19/2018 01/16/2018  Decreased Interest 0 0 0 0 0  Down, Depressed, Hopeless 0  0 0 0 0  PHQ -  2 Score 0 0 0 0 0  Altered sleeping 0 1 3 - 0  Tired, decreased energy 0 1 1 - 0  Change in appetite 0 3 1 - 0  Feeling bad or failure about yourself  0 0 0 - 0  Trouble concentrating 0 0 0 - 0  Moving slowly or fidgety/restless 0 1 0 - 0  Suicidal thoughts 0 0 0 - 0  PHQ-9 Score 0 6 5 - 0  Difficult doing work/chores Not difficult at all Not difficult at all Not difficult at all - Not difficult at all     Fall Risk: Fall Risk  06/26/2018 05/23/2018 05/20/2018 04/19/2018 03/21/2018  Falls in the past year? 1 1 1 1  No  Number falls in past yr: 1 1 1 1  -  Injury with Fall? 1 0 1 1 -  Comment - - - Hip Pain-Leg will give out on her -  Risk Factor Category  - - - - -  Risk for fall due to : History of fall(s) History of fall(s);Impaired balance/gait - - -  Risk for fall due to: Comment - pt states right leg gave out and c/o cramps in legs and muscles and restless legs - - -  Follow up Falls evaluation completed Falls evaluation completed;Falls prevention discussed - - -     Assessment & Plan   1. COPD exacerbation ([place])  - DG Chest 2 View; Future - predniSONE (DELTASONE) 10 MG tablet; Take 1 tablet (10 mg total) by mouth daily with breakfast.  Dispense: 10 tablet; Refill: 0 - levofloxacin (LEVAQUIN) 500 MG tablet; Take 1 tablet (500 mg total) by mouth daily.  Dispense: 7 tablet; Refill: 0  2. Cough  - DG Chest 2 View; Future  3. Microalbuminuria  - losartan (COZAAR) 25 MG tablet; Take 1 tablet (25 mg total) by mouth daily. In place of lisinopril  Dispense: 90 tablet; Refill: 0  4. Diabetes mellitus with microalbuminuria ([place])  Explained importance of following a diabetic diet while taking prednisone.

## 2018-07-22 ENCOUNTER — Encounter: Payer: Self-pay | Admitting: Family Medicine

## 2018-07-22 ENCOUNTER — Ambulatory Visit (INDEPENDENT_AMBULATORY_CARE_PROVIDER_SITE_OTHER): Payer: Medicare Other | Admitting: Family Medicine

## 2018-07-22 VITALS — BP 94/60 | HR 88 | Temp 98.0°F | Resp 16 | Ht <= 58 in | Wt 168.7 lb

## 2018-07-22 DIAGNOSIS — E785 Hyperlipidemia, unspecified: Secondary | ICD-10-CM

## 2018-07-22 DIAGNOSIS — I700 Atherosclerosis of aorta: Secondary | ICD-10-CM

## 2018-07-22 DIAGNOSIS — E1142 Type 2 diabetes mellitus with diabetic polyneuropathy: Secondary | ICD-10-CM | POA: Diagnosis not present

## 2018-07-22 DIAGNOSIS — M48062 Spinal stenosis, lumbar region with neurogenic claudication: Secondary | ICD-10-CM

## 2018-07-22 DIAGNOSIS — R109 Unspecified abdominal pain: Secondary | ICD-10-CM

## 2018-07-22 DIAGNOSIS — G2581 Restless legs syndrome: Secondary | ICD-10-CM

## 2018-07-22 DIAGNOSIS — J411 Mucopurulent chronic bronchitis: Secondary | ICD-10-CM

## 2018-07-22 DIAGNOSIS — G9519 Other vascular myelopathies: Secondary | ICD-10-CM

## 2018-07-22 DIAGNOSIS — R319 Hematuria, unspecified: Secondary | ICD-10-CM

## 2018-07-22 DIAGNOSIS — G8929 Other chronic pain: Secondary | ICD-10-CM

## 2018-07-22 DIAGNOSIS — R29818 Other symptoms and signs involving the nervous system: Secondary | ICD-10-CM

## 2018-07-22 DIAGNOSIS — E1129 Type 2 diabetes mellitus with other diabetic kidney complication: Secondary | ICD-10-CM

## 2018-07-22 DIAGNOSIS — R809 Proteinuria, unspecified: Secondary | ICD-10-CM

## 2018-07-22 DIAGNOSIS — M545 Low back pain: Secondary | ICD-10-CM

## 2018-07-22 LAB — POCT URINALYSIS DIPSTICK
Appearance: NORMAL
Bilirubin, UA: NEGATIVE
Glucose, UA: POSITIVE — AB
KETONES UA: NEGATIVE
Leukocytes, UA: NEGATIVE
Nitrite, UA: NEGATIVE
Odor: NORMAL
Protein, UA: NEGATIVE
Spec Grav, UA: 1.02 (ref 1.010–1.025)
Urobilinogen, UA: 0.2 E.U./dL
pH, UA: 5 (ref 5.0–8.0)

## 2018-07-22 LAB — POCT GLYCOSYLATED HEMOGLOBIN (HGB A1C): Hemoglobin A1C: 6.7 % — AB (ref 4.0–5.6)

## 2018-07-22 MED ORDER — ROPINIROLE HCL 0.5 MG PO TABS: 0.5000 mg | ORAL_TABLET | Freq: Every day | ORAL | 1 refills | Status: DC

## 2018-07-22 MED ORDER — LOSARTAN POTASSIUM 25 MG PO TABS: 12.5000 mg | ORAL_TABLET | Freq: Every day | ORAL | 0 refills | Status: DC

## 2018-07-22 MED ORDER — HYDROCODONE-ACETAMINOPHEN 5-325 MG PO TABS: 2.0000 | ORAL_TABLET | Freq: Two times a day (BID) | ORAL | 0 refills | Status: DC | PRN

## 2018-07-22 NOTE — Progress Notes (Signed)
Name: Julie Wall   MRN: [id number]    DOB: 1955/10/27   Date:07/22/2018       Progress Note  Subjective  Chief Complaint  Chief Complaint  Patient presents with  . Diabetes  . Hypertension  . Hypothyroidism    HPI  DMII with neuropathy and proteinuria: she has neurogenic claudication and history of proteinuria..She is drinking mostly watershe is still skipping meals.because she has a lack of appetite. Her hgbA1C hadgone down from 7.5% to 6.9% ,up to 7. 2 , 7.4% 7.0% 6.6,6.8%,7.1%down to 6.5%  up to 7.2% , now 6.7%Denies polyphagia, polydipsia but she has noticed polyuria with Jardiance also on metforminShe is on gabapentin and states leg pain is stable, usually more pain on the right calf than left, shealso takes ARB now 25 mg but bp is low and getting a little dizzy we will cut down to half pill daily.  Hyperlipidemia:she is back  Atorvastatin, She had lung cancer screening 05/2017 and it showed aorta atherosclerosis.She is also on aspirin  COPD: she quit smoking 12/03/2016, but resumed smoking againlast Fall, but is now down to 2 cigarettes daily. Shehad a COPD flare that required Levaquin and prednisone taper. She if finally feeling better, currently no cough , wheezing or SOB, spirometry normal today  Back pain: she has back pain a few times a week, pain now0/10 at rest, but goes up to 5/10 when walking, occasionally goes higher and takes hydrocodone prn, she states sometimes right leg gives out and she fell this week because of it - she did not get hurt.  She denies leg weakness, no bowel or bladder incontinence, she has numbness on left lateral thigh. Discussed risk of addiction and importance of no diversion. We decreased Hydrocodone to 30 pills to last 3 monthsAdvised to take Tylenol daily. Paresthesia on leg likely from neuralgia parestheticaon left leg.She also has calf pain secondary to neurogenic claudications. Unchanged   RLS: Shehas not been taking  Requip, occasionally has symptoms. Seen by neurologist in the past. She wants to resume Requip today   Atopic Dermatitis: doing much better on methotrexate continues to see [facility] dermatologist. Taking folic acid.Hair loss is stable. Explained may be secondary to methotrexate.Reviewed labs done last month at [facility]  Left Flank pain: started a few weeks ago, she states worse when going from sitting to standing position, non dysuria, change in urinary frequency, change in bowel movements or blood in stools or urine. No fever or chills. She states pain is described as sharp, intermittent and about 5/10. No previous history of kidney stones.   Morbid Obesity: she has BMI above 35 and co-morbidities. Discussed life style modification Trying to eat healthier again    Patient Active Problem List   Diagnosis Date Noted  . Morbid obesity ([place]) 06/26/2018  . Atherosclerosis of aorta ([place]) 05/04/2017  . Pap smear abnormality of cervix with ASCUS favoring benign 04/23/2017  . Vitamin D deficiency 12/14/2016  . Obesity (BMI 30.0-34.9) 12/13/2015  . Chronic bronchitis ([place]) 05/14/2015  . DM type 2 with diabetic peripheral neuropathy ([place]) 05/14/2015  . Positive H. pylori test 04/05/2015  . Rapid urease test for Helicobacter pylori infection positive 04/05/2015  . Umbilical hernia 62/83/6629  . Exomphalos 04/01/2015  . RLS (restless legs syndrome) 01/08/2015  . Type 2 diabetes mellitus with other diabetic kidney complication ([place]) 47/65/4650  . Neurogenic claudication 01/08/2015  . AD (atopic dermatitis) 12/28/2014  . Chronic LBP 12/28/2014  . Dyslipidemia 12/28/2014  . Gastroesophageal reflux disease without esophagitis  12/28/2014  . Microalbuminuria 12/28/2014  . Disorder of bone and cartilage 12/28/2014  . Neuralgia of left thigh 12/28/2014  . Perennial allergic rhinitis with seasonal variation 12/28/2014  . Tobacco abuse 12/28/2014  . Hypothyroidism 09/22/2005  . Hypertension, benign  09/22/2005    Past Surgical History:  Procedure Laterality Date  . CATARACT EXTRACTION Bilateral 1980  . CESAREAN SECTION    . CORNEAL TRANSPLANT Left    [facility]-Treatment for blindness.    Family History  Problem Relation Age of Onset  . Diabetes Mother   . Hypertension Mother   . Pancreatic cancer Mother   . Alcohol abuse Father   . Arthritis/Rheumatoid Sister   . Diabetes Sister   . Kidney disease Sister   . Diabetes Brother   . Breast cancer Neg Hx     Social History   Socioeconomic History  . Marital status: Single    Spouse name: Not on file  . Number of children: 3  . Years of education: Not on file  . Highest education level: High school graduate  Occupational History  . Not on file  Social Needs  . Financial resource strain: Not very hard  . Food insecurity:    Worry: Sometimes true    Inability: Never true  . Transportation needs:    Medical: No    Non-medical: No  Tobacco Use  . Smoking status: Current Every Day Smoker    Packs/day: 0.25    Years: 46.00    Pack years: 11.50    Types: Cigarettes    Start date: 01/07/1985  . Smokeless tobacco: Never Used  . Tobacco comment: patient is down to 4/day.trying to quit  -used to smoke heavily in the past   Substance and Sexual Activity  . Alcohol use: No    Alcohol/week: 0.0 standard drinks  . Drug use: No  . Sexual activity: Not Currently  Lifestyle  . Physical activity:    Days per week: 2 days    Minutes per session: 20 min  . Stress: Not at all  Relationships  . Social connections:    Talks on phone: More than three times a week    Gets together: More than three times a week    Attends religious service: More than 4 times per year    Active member of club or organization: Yes    Attends meetings of clubs or organizations: More than 4 times per year    Relationship status: Never married  . Intimate partner violence:    Fear of current or ex partner: No    Emotionally abused: No    Physically  abused: No    Forced sexual activity: No  Other Topics Concern  . Not on file  Social History Narrative   Disabled from severe atopic dermatitis   Has 3 children   Lives alone but very connect with family ( sees sister and her children daily)   Also belongs to a church     Current Outpatient Medications:  .  albuterol (PROVENTIL HFA;VENTOLIN HFA) 108 (90 Base) MCG/ACT inhaler, Inhale 2 puffs into the lungs every 6 (six) hours as needed for wheezing or shortness of breath., Disp: 1 Inhaler, Rfl: 0 .  Alcohol Swabs (ALCOHOL PREP) PADS, , Disp: , Rfl:  .  aspirin 81 MG chewable tablet, Chew 1 tablet by mouth daily., Disp: , Rfl:  .  atorvastatin (LIPITOR) 40 MG tablet, Take 1 tablet (40 mg total) by mouth daily. In place of simvastatin, Disp: 90  tablet, Rfl: 1 .  Blood Glucose Monitoring Suppl (ONE TOUCH ULTRA MINI) w/Device KIT, 1 each by Does not apply route daily., Disp: 1 each, Rfl: 0 .  Cholecalciferol (VITAMIN D) 2000 units CAPS, Take 1 capsule (2,000 Units total) by mouth daily. (Patient not taking: Reported on 07/01/2018), Disp: 30 capsule, Rfl: 12 .  empagliflozin (JARDIANCE) 25 MG TABS tablet, Take 25 mg by mouth daily., Disp: 90 tablet, Rfl: 1 .  folic acid (FOLVITE) 1 MG tablet, Take by mouth., Disp: , Rfl:  .  gabapentin (NEURONTIN) 300 MG capsule, Take 1 capsule (300 mg total) by mouth 3 (three) times daily., Disp: 270 capsule, Rfl: 1 .  glucose blood test strip, Check blood sugar once daily fasting, and up to 4 times daily PRN, Disp: 100 each, Rfl: 2 .  HYDROcodone-acetaminophen (NORCO/VICODIN) 5-325 MG tablet, Take 2 tablets by mouth 2 (two) times daily as needed for moderate pain., Disp: 30 tablet, Rfl: 0 .  Lancet Devices (SIMPLE DIAGNOSTICS LANCING DEV) MISC, Check blood sugar once daily fasting, and up to 4 times daily PRN., Disp: 100 each, Rfl: 2 .  losartan (COZAAR) 25 MG tablet, Take 1 tablet (25 mg total) by mouth daily. In place of lisinopril, Disp: 90 tablet, Rfl: 0 .   metFORMIN (GLUCOPHAGE-XR) 750 MG 24 hr tablet, Take 2 tablets (1,500 mg total) by mouth daily with breakfast., Disp: 180 tablet, Rfl: 1 .  methotrexate (RHEUMATREX) 2.5 MG tablet, Take 15 mg by mouth once a week. , Disp: , Rfl:  .  predniSONE (DELTASONE) 10 MG tablet, Take 1 tablet (10 mg total) by mouth daily with breakfast., Disp: 10 tablet, Rfl: 0 .  triamcinolone cream (KENALOG) 0.1 %, TRIAMCINOLONE ACETONIDE, 0.1% (External Cream)  use per dermatologist. for 0 days  Quantity: 1.00;  Refills: 0   Ordered :15-Jun-2010  Steele Sizer MD;  Started 02-Apr-2008 Active Comments: DX: 724.2, Disp: 30 g, Rfl: 3 .  umeclidinium-vilanterol (ANORO ELLIPTA) 62.5-25 MCG/INH AEPB, Inhale 1 puff into the lungs daily., Disp: 180 each, Rfl: 2  Current Facility-Administered Medications:  .  albuterol (PROVENTIL) (2.5 MG/3ML) 0.083% nebulizer solution 2.5 mg, 2.5 mg, Nebulization, Once, Hubbard Hartshorn, FNP  Allergies  Allergen Reactions  . Penicillins Rash    I personally reviewed active problem list, medication list, allergies, family history, social history with the patient/caregiver today.   ROS  Constitutional: Negative for fever or weight change.  Respiratory: Negative for cough and shortness of breath.   Cardiovascular: Negative for chest pain or palpitations.  Gastrointestinal: Negative for abdominal pain, no bowel changes.  Musculoskeletal: Negative for gait problem or joint swelling.  Skin: Negative for rash.  Neurological: positive  For intermittent  dizziness but no  headache.  No other specific complaints in a complete review of systems (except as listed in HPI above).  Objective  Vitals:   07/22/18 1123  BP: 94/60  Pulse: 88  Resp: 16  Temp: 98 F (36.7 C)  TempSrc: Oral  SpO2: 97%  Weight: 168 lb 11.2 oz (76.5 kg)  Height: _0  (1.473 m)    Body mass index is 35.26 kg/m.  Physical Exam  Constitutional: Patient appears well-developed and well-nourished. Obese  No  distress.  HEENT: head atraumatic, normocephalic, pupils equal and reactive to light, neck supple, throat within normal limits Cardiovascular: Normal rate, regular rhythm and normal heart sounds.  No murmur heard. No BLE edema. Pulmonary/Chest: Effort normal and breath sounds normal. No respiratory distress. Abdominal: Soft.  There is  Tenderness during palpation of left flank and left lower quadrant, initially painful during percussion of left CVA but states not that bad, able to jump up and down and also moved trunk from left to right does not seem to be muscular, but also not likely colitis/peritonitis, possible kidney stone. Psychiatric: Patient has a normal mood and affect. behavior is normal. Judgment and thought content normal.  Recent Results (from the past 2160 hour(s))  HM DIABETES EYE EXAM     Status: None   Collection Time: 06/26/18 12:00 AM  Result Value Ref Range   HM Diabetic Eye Exam No Retinopathy No Retinopathy  POCT HgB A1C     Status: Abnormal   Collection Time: 07/22/18 11:29 AM  Result Value Ref Range   Hemoglobin A1C 6.7 (A) 4.0 - 5.6 %   HbA1c POC (<> result, manual entry)     HbA1c, POC (prediabetic range)     HbA1c, POC (controlled diabetic range)    POCT Urinalysis Dipstick     Status: Abnormal   Collection Time: 07/22/18 11:29 AM  Result Value Ref Range   Color, UA yellow    Clarity, UA clear    Glucose, UA Positive (A) Negative   Bilirubin, UA Negative    Ketones, UA Negative    Spec Grav, UA 1.020 1.010 - 1.025   Blood, UA Postive    pH, UA 5.0 5.0 - 8.0   Protein, UA Negative Negative   Urobilinogen, UA 0.2 0.2 or 1.0 E.U./dL   Nitrite, UA Negative    Leukocytes, UA Negative Negative   Appearance normal    Odor Normal       PHQ2/9: Depression screen [facility] 2/9 07/22/2018 06/26/2018 05/23/2018 05/20/2018 04/19/2018  Decreased Interest 0 0 0 0 0  Down, Depressed, Hopeless 0 0 0 0 0  PHQ - 2 Score 0 0 0 0 0  Altered sleeping - 0 1 3 -  Tired,  decreased energy - 0 1 1 -  Change in appetite - 0 3 1 -  Feeling bad or failure about yourself  - 0 0 0 -  Trouble concentrating - 0 0 0 -  Moving slowly or fidgety/restless - 0 1 0 -  Suicidal thoughts - 0 0 0 -  PHQ-9 Score - 0 6 5 -  Difficult doing work/chores - Not difficult at all Not difficult at all Not difficult at all -     Fall Risk: Fall Risk  07/22/2018 06/26/2018 05/23/2018 05/20/2018 04/19/2018  Falls in the past year? 0 _0 Number falls in past yr: 0 _1 Injury with Fall? 0 1 0 1 1  Comment - - - - Hip Pain-Leg will give out on her  Risk Factor Category  - - - - -  Risk for fall due to : - History of fall(s) History of fall(s);Impaired balance/gait - -  Risk for fall due to: Comment - - pt states right leg gave out and c/o cramps in legs and muscles and restless legs - -  Follow up - Falls evaluation completed Falls evaluation completed;Falls prevention discussed - -      Assessment & Plan  1. DM type 2 with diabetic peripheral neuropathy (HCC)  - POCT HgB A1C  2. Left flank pain  - POCT Urinalysis Dipstick - CULTURE, URINE COMPREHENSIVE - CT stone search  3. Microalbuminuria  - losartan (COZAAR) 25 MG tablet; Take 0.5 tablets (12.5 mg total) by mouth daily. In  place of lisinopril  Dispense: 45 tablet; Refill: 0  4. Diabetes mellitus with microalbuminuria (HCC)  - losartan (COZAAR) 25 MG tablet; Take 0.5 tablets (12.5 mg total) by mouth daily. In place of lisinopril  Dispense: 45 tablet; Refill: 0  5. Dyslipidemia   6. Mucopurulent chronic bronchitis (HCC)  Spirometry normal   7. Neurogenic claudication  - HYDROcodone-acetaminophen (NORCO/VICODIN) 5-325 MG tablet; Take 2 tablets by mouth 2 (two) times daily as needed for moderate pain.  Dispense: 30 tablet; Refill: 0  8. Atherosclerosis of aorta (HCC)  On statin   9. Morbid obesity ([place])  Discussed with the patient the risk posed by an increased BMI. Discussed importance of  portion control, calorie counting and at least 150 minutes of physical activity weekly. Avoid sweet beverages and drink more water. Eat at least 6 servings of fruit and vegetables daily   10. Chronic bilateral low back pain without sciatica  - HYDROcodone-acetaminophen (NORCO/VICODIN) 5-325 MG tablet; Take 2 tablets by mouth 2 (two) times daily as needed for moderate pain.  Dispense: 30 tablet; Refill: 0  11. Hematuria, unspecified type  - CT RENAL STONE STUDY; Future

## 2018-07-24 ENCOUNTER — Ambulatory Visit
Admission: RE | Admit: 2018-07-24 | Discharge: 2018-07-24 | Disposition: A | Discharge status: Home / Self Care | Payer: Medicare Other | Source: Ambulatory Visit | Attending: Family Medicine | Admitting: Family Medicine

## 2018-07-24 DIAGNOSIS — R319 Hematuria, unspecified: Secondary | ICD-10-CM

## 2018-07-24 DIAGNOSIS — R109 Unspecified abdominal pain: Secondary | ICD-10-CM | POA: Diagnosis not present

## 2018-07-24 DIAGNOSIS — R3129 Other microscopic hematuria: Secondary | ICD-10-CM | POA: Diagnosis not present

## 2018-07-24 LAB — CULTURE, URINE COMPREHENSIVE

## 2018-07-25 ENCOUNTER — Other Ambulatory Visit: Payer: Self-pay | Admitting: Family Medicine

## 2018-07-25 MED ORDER — POLYETHYLENE GLYCOL 3350 17 GM/SCOOP PO POWD: 17.0000 g | Freq: Every day | ORAL | 1 refills | Status: DC

## 2018-10-21 ENCOUNTER — Other Ambulatory Visit: Payer: Self-pay

## 2018-10-21 ENCOUNTER — Encounter: Payer: Self-pay | Admitting: Family Medicine

## 2018-10-21 ENCOUNTER — Ambulatory Visit (INDEPENDENT_AMBULATORY_CARE_PROVIDER_SITE_OTHER): Payer: Medicare Other | Admitting: Family Medicine

## 2018-10-21 VITALS — BP 114/62 | HR 102 | Temp 98.0°F | Resp 18 | Ht <= 58 in | Wt 171.3 lb

## 2018-10-21 DIAGNOSIS — M48062 Spinal stenosis, lumbar region with neurogenic claudication: Secondary | ICD-10-CM

## 2018-10-21 DIAGNOSIS — E785 Hyperlipidemia, unspecified: Secondary | ICD-10-CM | POA: Diagnosis not present

## 2018-10-21 DIAGNOSIS — R809 Proteinuria, unspecified: Secondary | ICD-10-CM

## 2018-10-21 DIAGNOSIS — E1142 Type 2 diabetes mellitus with diabetic polyneuropathy: Secondary | ICD-10-CM

## 2018-10-21 DIAGNOSIS — M545 Low back pain: Secondary | ICD-10-CM

## 2018-10-21 DIAGNOSIS — G8929 Other chronic pain: Secondary | ICD-10-CM

## 2018-10-21 DIAGNOSIS — J411 Mucopurulent chronic bronchitis: Secondary | ICD-10-CM

## 2018-10-21 DIAGNOSIS — G9519 Other vascular myelopathies: Secondary | ICD-10-CM

## 2018-10-21 DIAGNOSIS — E1129 Type 2 diabetes mellitus with other diabetic kidney complication: Secondary | ICD-10-CM | POA: Diagnosis not present

## 2018-10-21 LAB — POCT GLYCOSYLATED HEMOGLOBIN (HGB A1C): HbA1c, POC (controlled diabetic range): 7.3 % — AB (ref 0.0–7.0)

## 2018-10-21 MED ORDER — ALBUTEROL SULFATE HFA 108 (90 BASE) MCG/ACT IN AERS: 2.0000 | INHALATION_SPRAY | Freq: Four times a day (QID) | RESPIRATORY_TRACT | 0 refills | Status: DC | PRN

## 2018-10-21 MED ORDER — HYDROCODONE-ACETAMINOPHEN 5-325 MG PO TABS: 2.0000 | ORAL_TABLET | Freq: Two times a day (BID) | ORAL | 0 refills | Status: DC | PRN

## 2018-10-21 MED ORDER — ATORVASTATIN CALCIUM 40 MG PO TABS: 40.0000 mg | ORAL_TABLET | Freq: Every day | ORAL | 1 refills | Status: DC

## 2018-10-21 MED ORDER — LOSARTAN POTASSIUM 25 MG PO TABS: 25.0000 mg | ORAL_TABLET | Freq: Every day | ORAL | 0 refills | Status: DC

## 2018-10-21 MED ORDER — METFORMIN HCL ER 750 MG PO TB24: 1500.0000 mg | ORAL_TABLET | Freq: Every day | ORAL | 1 refills | Status: DC

## 2018-10-21 MED ORDER — GABAPENTIN 300 MG PO CAPS: 300.0000 mg | ORAL_CAPSULE | Freq: Three times a day (TID) | ORAL | 0 refills | Status: DC

## 2018-10-21 MED ORDER — EMPAGLIFLOZIN 25 MG PO TABS: 25.0000 mg | ORAL_TABLET | Freq: Every day | ORAL | 1 refills | Status: DC

## 2018-10-21 MED ORDER — UMECLIDINIUM-VILANTEROL 62.5-25 MCG/INH IN AEPB: 1.0000 | INHALATION_SPRAY | Freq: Every day | RESPIRATORY_TRACT | 2 refills | Status: DC

## 2018-10-21 NOTE — Progress Notes (Signed)
Name: Julie Wall   MRN: [id number]    DOB: 13-Feb-1956   Date:10/21/2018       Progress Note  Subjective  Chief Complaint  Chief Complaint  Patient presents with  . Medication Refill  . Diabetes  . COPD  . Hyperlipidemia  . RLS    HPI  DMII with neuropathy and proteinuria: she has neurogenic claudication and history of proteinuria..She is drinking mostly watershe is still skipping meals.because she has a lack of appetite. Her hgbA1C hadgone down from 7.5% to 6.9% ,up to 7. 2 , 7.4% 7.0% 6.6,6.8%,7.1%down to 6.5% up to 7.2%, 6.7% and up again at 7.3% , she has been eating cakes Denies polyphagia, polydipsia but she has noticed polyuria with Jardiance also on metforminShe is on gabapentin and states leg pain is stable, usually more pain on the right calf than left, shealso takes ARB 50 mg for microalbuminuria, states dizziness not as frequent now , she states pill is too small to cut  Hyperlipidemia:she is back  Atorvastatin, we will recheck labs today  She had lung cancer screening 05/2017 and it showed aorta atherosclerosis.She is also takes aspirin   COPD: she quit smoking 12/03/2016, but resumed smoking againlast Fall, but is now down to 2 cigarettes daily. She still coughs sometimes yellow sputum, but usually clear, no wheezing at this time  Back pain: she has back pain a few times a week, pain now5/10 at rest, but goes up to8/10 when walking, and takes hydrocodone prn, she states sometimes right leg gives out , she fell this week and hit left knee on the ground but she does not think it was from her back pain.  She denies leg weakness, no bowel or bladder incontinence, she has numbness on left lateral thigh. Discussed risk of addiction and importance of no diversion. She is doing okay with 30 pills of hydrocodone for 3 months. Advised to take Tylenol daily. Paresthesia on leg likely from neuralgia parestheticaon left leg.She also has calf pain secondary to  neurogenic claudications.   RLS: Shehas not been taking Requip, occasionally has symptoms. Seen by neurologist in the past. She is back on Requip since 07/2018 and has been sleeping better since   Atopic Dermatitis: doing much better on methotrexate continues to see [facility] dermatologist. Taking folic acid.Hair loss is stable.   Morbid Obesity: she has BMI above 35 and co-morbidities. Discussed life style modification She is eating more sweets and has gained a little weight, explained importance of resuming a diabetic diet    Patient Active Problem List   Diagnosis Date Noted  . Morbid obesity ([place]) 06/26/2018  . Atherosclerosis of aorta ([place]) 05/04/2017  . Pap smear abnormality of cervix with ASCUS favoring benign 04/23/2017  . Vitamin D deficiency 12/14/2016  . Obesity (BMI 30.0-34.9) 12/13/2015  . Chronic bronchitis ([place]) 05/14/2015  . DM type 2 with diabetic peripheral neuropathy ([place]) 05/14/2015  . Positive H. pylori test 04/05/2015  . Rapid urease test for Helicobacter pylori infection positive 04/05/2015  . Umbilical hernia 78/29/5621  . Exomphalos 04/01/2015  . RLS (restless legs syndrome) 01/08/2015  . Type 2 diabetes mellitus with other diabetic kidney complication ([place]) 30/86/5784  . Neurogenic claudication 01/08/2015  . AD (atopic dermatitis) 12/28/2014  . Chronic LBP 12/28/2014  . Dyslipidemia 12/28/2014  . Gastroesophageal reflux disease without esophagitis 12/28/2014  . Microalbuminuria 12/28/2014  . Disorder of bone and cartilage 12/28/2014  . Neuralgia of left thigh 12/28/2014  . Perennial allergic rhinitis with seasonal variation 12/28/2014  .  Tobacco abuse 12/28/2014  . Hypothyroidism 09/22/2005  . Hypertension, benign 09/22/2005    Past Surgical History:  Procedure Laterality Date  . CATARACT EXTRACTION Bilateral 1980  . CESAREAN SECTION    . CORNEAL TRANSPLANT Left    [facility]-Treatment for blindness.    Family History  Problem Relation Age of Onset   . Diabetes Mother   . Hypertension Mother   . Pancreatic cancer Mother   . Alcohol abuse Father   . Arthritis/Rheumatoid Sister   . Diabetes Sister   . Kidney disease Sister   . Diabetes Brother   . Breast cancer Neg Hx     Social History   Socioeconomic History  . Marital status: Single    Spouse name: Not on file  . Number of children: 3  . Years of education: Not on file  . Highest education level: High school graduate  Occupational History  . Not on file  Social Needs  . Financial resource strain: Not very hard  . Food insecurity:    Worry: Sometimes true    Inability: Never true  . Transportation needs:    Medical: No    Non-medical: No  Tobacco Use  . Smoking status: Current Every Day Smoker    Packs/day: 0.10    Years: 46.00    Pack years: 4.60    Types: Cigarettes    Start date: 01/07/1985  . Smokeless tobacco: Never Used  . Tobacco comment: patient is down to 4/day.trying to quit  -used to smoke heavily in the past   Substance and Sexual Activity  . Alcohol use: No    Alcohol/week: 0.0 standard drinks  . Drug use: No  . Sexual activity: Not Currently  Lifestyle  . Physical activity:    Days per week: 2 days    Minutes per session: 20 min  . Stress: Not at all  Relationships  . Social connections:    Talks on phone: More than three times a week    Gets together: More than three times a week    Attends religious service: More than 4 times per year    Active member of club or organization: Yes    Attends meetings of clubs or organizations: More than 4 times per year    Relationship status: Never married  . Intimate partner violence:    Fear of current or ex partner: No    Emotionally abused: No    Physically abused: No    Forced sexual activity: No  Other Topics Concern  . Not on file  Social History Narrative   Disabled from severe atopic dermatitis   Has 3 children   Lives alone but very connect with family ( sees sister and her children  daily)   Also belongs to a church     Current Outpatient Medications:  .  albuterol (PROVENTIL HFA;VENTOLIN HFA) 108 (90 Base) MCG/ACT inhaler, Inhale 2 puffs into the lungs every 6 (six) hours as needed for wheezing or shortness of breath., Disp: 1 Inhaler, Rfl: 0 .  Alcohol Swabs (ALCOHOL PREP) PADS, , Disp: , Rfl:  .  aspirin 81 MG chewable tablet, Chew 1 tablet by mouth daily., Disp: , Rfl:  .  atorvastatin (LIPITOR) 40 MG tablet, Take 1 tablet (40 mg total) by mouth daily. In place of simvastatin, Disp: 90 tablet, Rfl: 1 .  Blood Glucose Monitoring Suppl (ONE TOUCH ULTRA MINI) w/Device KIT, 1 each by Does not apply route daily., Disp: 1 each, Rfl: 0 .  empagliflozin (  JARDIANCE) 25 MG TABS tablet, Take 25 mg by mouth daily., Disp: 90 tablet, Rfl: 1 .  folic acid (FOLVITE) 1 MG tablet, Take by mouth., Disp: , Rfl:  .  gabapentin (NEURONTIN) 300 MG capsule, Take 1 capsule (300 mg total) by mouth 3 (three) times daily., Disp: 270 capsule, Rfl: 1 .  glucose blood test strip, Check blood sugar once daily fasting, and up to 4 times daily PRN, Disp: 100 each, Rfl: 2 .  HYDROcodone-acetaminophen (NORCO/VICODIN) 5-325 MG tablet, Take 2 tablets by mouth 2 (two) times daily as needed for moderate pain., Disp: 30 tablet, Rfl: 0 .  Lancet Devices (SIMPLE DIAGNOSTICS LANCING DEV) MISC, Check blood sugar once daily fasting, and up to 4 times daily PRN., Disp: 100 each, Rfl: 2 .  losartan (COZAAR) 25 MG tablet, Take 0.5 tablets (12.5 mg total) by mouth daily. In place of lisinopril, Disp: 45 tablet, Rfl: 0 .  metFORMIN (GLUCOPHAGE-XR) 750 MG 24 hr tablet, Take 2 tablets (1,500 mg total) by mouth daily with breakfast., Disp: 180 tablet, Rfl: 1 .  methotrexate (RHEUMATREX) 2.5 MG tablet, Take 15 mg by mouth once a week. , Disp: , Rfl:  .  rOPINIRole (REQUIP) 0.5 MG tablet, Take 1 tablet (0.5 mg total) by mouth at bedtime., Disp: 90 tablet, Rfl: 1 .  umeclidinium-vilanterol (ANORO ELLIPTA) 62.5-25 MCG/INH  AEPB, Inhale 1 puff into the lungs daily., Disp: 180 each, Rfl: 2 .  Cholecalciferol (VITAMIN D) 2000 units CAPS, Take 1 capsule (2,000 Units total) by mouth daily. (Patient not taking: Reported on 10/21/2018), Disp: 30 capsule, Rfl: 12 .  triamcinolone ointment (KENALOG) 0.1 %, , Disp: , Rfl:   Allergies  Allergen Reactions  . Penicillins Rash    I personally reviewed active problem list, medication list, allergies, family history, social history with the patient/caregiver today.   ROS  Constitutional: Negative for fever or significant  weight change.  Respiratory: Positive  for cough but no shortness of breath.   Cardiovascular: Negative for chest pain or palpitations.  Gastrointestinal: Negative for abdominal pain, no bowel changes.  Musculoskeletal: Negative for gait problem or joint swelling.  Skin: positive  for rash.  Neurological: Negative for dizziness or headache.  No other specific complaints in a complete review of systems (except as listed in HPI above).  Objective  Vitals:   10/21/18 0947  BP: 114/62  Pulse: (!) 102  Resp: 18  Temp: 98 F (36.7 C)  TempSrc: Oral  SpO2: 97%  Weight: 171 lb 4.8 oz (77.7 kg)  Height: 4' 10"  (1.473 m)    Body mass index is 35.8 kg/m.  Physical Exam  Constitutional: Patient appears well-developed and well-nourished. Obese  No distress.  HEENT: head atraumatic, normocephalic, pupils equal and reactive to light, neck supple Cardiovascular: Normal rate, regular rhythm and normal heart sounds.  No murmur heard. No BLE edema. Pulmonary/Chest: Effort normal and breath sounds normal. No respiratory distress. Abdominal: Soft.  There is no tenderness. Psychiatric: Patient has a normal mood and affect. behavior is normal. Judgment and thought content normal.  Recent Results (from the past 2160 hour(s))  POCT HgB A1C     Status: Abnormal   Collection Time: 10/21/18  9:51 AM  Result Value Ref Range   Hemoglobin A1C     HbA1c POC (<>  result, manual entry)     HbA1c, POC (prediabetic range)     HbA1c, POC (controlled diabetic range) 7.3 (A) 0.0 - 7.0 %    Diabetic Foot Exam: Diabetic  Foot Exam - Simple   Simple Foot Form Diabetic Foot exam was performed with the following findings:  Yes 10/21/2018 10:08 AM  Visual Inspection See comments:  Yes Sensation Testing Intact to touch and monofilament testing bilaterally:  Yes Pulse Check Posterior Tibialis and Dorsalis pulse intact bilaterally:  Yes Comments Callus and corn formation       PHQ2/9: Depression screen [facility] 2/9 10/21/2018 07/22/2018 06/26/2018 05/23/2018 05/20/2018  Decreased Interest 0 0 0 0 0  Down, Depressed, Hopeless 0 0 0 0 0  PHQ - 2 Score 0 0 0 0 0  Altered sleeping 0 - 0 1 3  Tired, decreased energy 0 - 0 1 1  Change in appetite 0 - 0 3 1  Feeling bad or failure about yourself  0 - 0 0 0  Trouble concentrating 0 - 0 0 0  Moving slowly or fidgety/restless 0 - 0 1 0  Suicidal thoughts 0 - 0 0 0  PHQ-9 Score 0 - 0 6 5  Difficult doing work/chores Not difficult at all - Not difficult at all Not difficult at all Not difficult at all  Some recent data might be hidden    phq 9 is negative   Fall Risk: Fall Risk  10/21/2018 07/22/2018 06/26/2018 05/23/2018 05/20/2018  Falls in the past year? 1 0 1 1 1   Number falls in past yr: 1 0 1 1 1   Injury with Fall? 0 0 1 0 1  Comment - - - - -  Risk Factor Category  - - - - -  Risk for fall due to : - - History of fall(s) History of fall(s);Impaired balance/gait -  Risk for fall due to: Comment - - - pt states right leg gave out and c/o cramps in legs and muscles and restless legs -  Follow up - - Falls evaluation completed Falls evaluation completed;Falls prevention discussed -     Functional Status Survey: Is the patient deaf or have difficulty hearing?: No Does the patient have difficulty seeing, even when wearing glasses/contacts?: Yes Does the patient have difficulty concentrating, remembering, or  making decisions?: No Does the patient have difficulty walking or climbing stairs?: No Does the patient have difficulty dressing or bathing?: No Does the patient have difficulty doing errands alone such as visiting a doctor's office or shopping?: No    Assessment & Plan  1. Diabetes mellitus with microalbuminuria (HCC)  - POCT HgB A1C - losartan (COZAAR) 25 MG tablet; Take 1 tablet (25 mg total) by mouth daily. In place of lisinopril  Dispense: 90 tablet; Refill: 0 - COMPLETE METABOLIC PANEL WITH GFR - Microalbumin / creatinine urine ratio  2. Dyslipidemia  - atorvastatin (LIPITOR) 40 MG tablet; Take 1 tablet (40 mg total) by mouth daily. In place of simvastatin  Dispense: 90 tablet; Refill: 1 - Lipid panel  3. DM type 2 with diabetic peripheral neuropathy (HCC)  - empagliflozin (JARDIANCE) 25 MG TABS tablet; Take 25 mg by mouth daily.  Dispense: 90 tablet; Refill: 1 - metFORMIN (GLUCOPHAGE-XR) 750 MG 24 hr tablet; Take 2 tablets (1,500 mg total) by mouth daily with breakfast.  Dispense: 180 tablet; Refill: 1  4. Type 2 diabetes mellitus with microalbuminuria, without Januszewski-term current use of insulin (HCC)  - empagliflozin (JARDIANCE) 25 MG TABS tablet; Take 25 mg by mouth daily.  Dispense: 90 tablet; Refill: 1 - metFORMIN (GLUCOPHAGE-XR) 750 MG 24 hr tablet; Take 2 tablets (1,500 mg total) by mouth daily with breakfast.  Dispense: 180  tablet; Refill: 1  5. Neurogenic claudication  - gabapentin (NEURONTIN) 300 MG capsule; Take 1 capsule (300 mg total) by mouth 3 (three) times daily.  Dispense: 270 capsule; Refill: 0 - HYDROcodone-acetaminophen (NORCO/VICODIN) 5-325 MG tablet; Take 2 tablets by mouth 2 (two) times daily as needed for moderate pain.  Dispense: 30 tablet; Refill: 0  6. Chronic bilateral low back pain without sciatica  - gabapentin (NEURONTIN) 300 MG capsule; Take 1 capsule (300 mg total) by mouth 3 (three) times daily.  Dispense: 270 capsule; Refill: 0 -  HYDROcodone-acetaminophen (NORCO/VICODIN) 5-325 MG tablet; Take 2 tablets by mouth 2 (two) times daily as needed for moderate pain.  Dispense: 30 tablet; Refill: 0  7. Microalbuminuria  - losartan (COZAAR) 25 MG tablet; Take 1 tablet (25 mg total) by mouth daily. In place of lisinopril  Dispense: 90 tablet; Refill: 0 - Microalbumin / creatinine urine ratio  8. Mucopurulent chronic bronchitis ([place])  - umeclidinium-vilanterol (ANORO ELLIPTA) 62.5-25 MCG/INH AEPB; Inhale 1 puff into the lungs daily.  Dispense: 180 each; Refill: 2 - albuterol (VENTOLIN HFA) 108 (90 Base) MCG/ACT inhaler; Inhale 2 puffs into the lungs every 6 (six) hours as needed for wheezing or shortness of breath.  Dispense: 1 Inhaler; Refill: 0

## 2018-10-22 LAB — MICROALBUMIN / CREATININE URINE RATIO
Creatinine, Urine: 84 mg/dL (ref 20–275)
Microalb, Ur: <0.2 mg/dL

## 2018-10-22 LAB — COMPLETE METABOLIC PANEL WITH GFR
AG Ratio: 1.4 (calc) (ref 1.0–2.5)
ALT: 14 U/L (ref 6–29)
AST: 14 U/L (ref 10–35)
Albumin: 4.1 g/dL (ref 3.6–5.1)
Alkaline phosphatase (APISO): 84 U/L (ref 37–153)
BUN: 14 mg/dL (ref 7–25)
CO2: 24 mmol/L (ref 20–32)
Calcium: 9.5 mg/dL (ref 8.6–10.4)
Chloride: 109 mmol/L (ref 98–110)
Creat: 0.7 mg/dL (ref 0.50–0.99)
GFR, Est African American: 106 mL/min/{1.73_m2} (ref >=60)
GFR, Est Non African American: 92 mL/min/{1.73_m2} (ref >=60)
Globulin: 3 g/dL (calc) (ref 1.9–3.7)
Glucose, Bld: 117 mg/dL — ABNORMAL HIGH (ref 65–99)
Potassium: 4 mmol/L (ref 3.5–5.3)
Sodium: 140 mmol/L (ref 135–146)
Total Bilirubin: 0.4 mg/dL (ref 0.2–1.2)
Total Protein: 7.1 g/dL (ref 6.1–8.1)

## 2018-10-22 LAB — LIPID PANEL
Cholesterol: 98 mg/dL (ref <200)
HDL: 45 mg/dL — ABNORMAL LOW (ref >=50)
LDL Cholesterol (Calc): 39 mg/dL (calc)
Non-HDL Cholesterol (Calc): 53 mg/dL (calc) (ref <130)
Total CHOL/HDL Ratio: 2.2 (calc) (ref <5.0)
Triglycerides: 64 mg/dL (ref <150)

## 2018-12-09 DIAGNOSIS — L2089 Other atopic dermatitis: Secondary | ICD-10-CM | POA: Diagnosis not present

## 2018-12-09 DIAGNOSIS — Z79899 Other long term (current) drug therapy: Secondary | ICD-10-CM | POA: Diagnosis not present

## 2018-12-09 DIAGNOSIS — L2084 Intrinsic (allergic) eczema: Secondary | ICD-10-CM | POA: Diagnosis not present

## 2019-01-16 DIAGNOSIS — G2581 Restless legs syndrome: Secondary | ICD-10-CM | POA: Diagnosis not present

## 2019-01-16 DIAGNOSIS — E559 Vitamin D deficiency, unspecified: Secondary | ICD-10-CM | POA: Diagnosis not present

## 2019-01-16 DIAGNOSIS — E611 Iron deficiency: Secondary | ICD-10-CM | POA: Diagnosis not present

## 2019-01-16 DIAGNOSIS — E1142 Type 2 diabetes mellitus with diabetic polyneuropathy: Secondary | ICD-10-CM | POA: Diagnosis not present

## 2019-01-16 DIAGNOSIS — G5712 Meralgia paresthetica, left lower limb: Secondary | ICD-10-CM | POA: Diagnosis not present

## 2019-01-21 ENCOUNTER — Encounter: Payer: Self-pay | Admitting: Family Medicine

## 2019-01-21 ENCOUNTER — Other Ambulatory Visit: Payer: Self-pay

## 2019-01-21 ENCOUNTER — Ambulatory Visit (INDEPENDENT_AMBULATORY_CARE_PROVIDER_SITE_OTHER): Payer: Medicare Other | Admitting: Family Medicine

## 2019-01-21 VITALS — BP 130/68 | HR 99 | Temp 97.5°F | Resp 16 | Ht <= 58 in | Wt 177.4 lb

## 2019-01-21 DIAGNOSIS — E1129 Type 2 diabetes mellitus with other diabetic kidney complication: Secondary | ICD-10-CM | POA: Diagnosis not present

## 2019-01-21 DIAGNOSIS — M545 Low back pain: Secondary | ICD-10-CM

## 2019-01-21 DIAGNOSIS — I700 Atherosclerosis of aorta: Secondary | ICD-10-CM

## 2019-01-21 DIAGNOSIS — R809 Proteinuria, unspecified: Secondary | ICD-10-CM | POA: Diagnosis not present

## 2019-01-21 DIAGNOSIS — E559 Vitamin D deficiency, unspecified: Secondary | ICD-10-CM | POA: Diagnosis not present

## 2019-01-21 DIAGNOSIS — G9519 Other vascular myelopathies: Secondary | ICD-10-CM

## 2019-01-21 DIAGNOSIS — J411 Mucopurulent chronic bronchitis: Secondary | ICD-10-CM

## 2019-01-21 DIAGNOSIS — M48062 Spinal stenosis, lumbar region with neurogenic claudication: Secondary | ICD-10-CM

## 2019-01-21 DIAGNOSIS — G2581 Restless legs syndrome: Secondary | ICD-10-CM

## 2019-01-21 DIAGNOSIS — G8929 Other chronic pain: Secondary | ICD-10-CM

## 2019-01-21 LAB — POCT GLYCOSYLATED HEMOGLOBIN (HGB A1C): Hemoglobin A1C: 7.3 % — AB (ref 4.0–5.6)

## 2019-01-21 MED ORDER — ALBUTEROL SULFATE HFA 108 (90 BASE) MCG/ACT IN AERS: 2.0000 | INHALATION_SPRAY | Freq: Four times a day (QID) | RESPIRATORY_TRACT | 0 refills | Status: DC | PRN

## 2019-01-21 MED ORDER — GABAPENTIN 300 MG PO CAPS: 300.0000 mg | ORAL_CAPSULE | Freq: Three times a day (TID) | ORAL | 0 refills | Status: DC

## 2019-01-21 MED ORDER — VITAMIN D 50 MCG (2000 UT) PO CAPS: 1.0000 | ORAL_CAPSULE | Freq: Every day | ORAL | 12 refills | Status: DC

## 2019-01-21 MED ORDER — HYDROCODONE-ACETAMINOPHEN 5-325 MG PO TABS: 2.0000 | ORAL_TABLET | Freq: Two times a day (BID) | ORAL | 0 refills | Status: DC | PRN

## 2019-01-21 MED ORDER — LOSARTAN POTASSIUM 25 MG PO TABS: 25.0000 mg | ORAL_TABLET | Freq: Every day | ORAL | 0 refills | Status: DC

## 2019-01-21 NOTE — Progress Notes (Signed)
Name: Julie Wall   MRN: [id number]    DOB: 08-21-1955   Date:01/21/2019       Progress Note  Subjective  Chief Complaint  Chief Complaint  Patient presents with  . Diabetes  . Hypertension  . Gastroesophageal Reflux    HPI  DMII with neuropathy and proteinuria: she has neurogenic claudication and history of proteinuria, but last urine micro was normal 10/2018 . She is drinking mostly watershe is still skipping meals.because she has a lack of appetite. Her hgbA1C 7.5% to 6.9% ,up to 7. 2 , 7.4% 7.0% 6.6,6.8%,7.1%down to 6.5%up to 7.2%, 6.7% , 7.3% today  7.2% , she has been eating cakes Denies polyphagia, polydipsia but she has noticed polyuria with Jardiance also on metforminShe is on gabapentin and states leg pain is stable but now noticing some tingling on the bottom of right foot while walking shealso takes ARB 50 mg for microalbuminuria  Hyperlipidemia:she is backAtorvastatin, last LDL was at goal, HDL was low, and needs to increase intake of fish and tree nuts   She had lung cancer screening 05/2017 and it showed aorta atherosclerosis.She is also takes aspirin   COPD: she quit smoking 12/03/2016, but resumed smoking Fall 2019 l, she smoked 3 cigarettes yesterday, she states Anoro and is helping her breath. She still coughs sometimes yellow sputum, but usually clear, no wheezing at this time, SOB is intermittent and worse when she smokes  Back pain: she has back pain a few times a week, pain now2/10 at rest, but goes up to8/10 when walking, and takes hydrocodone prn, she states sometimes right leg gives out , she fell this week and hit left knee on the ground but she does not think it was from her back pain.  She denies leg weakness, no bowel or bladder incontinence, she has numbness on left lateral thigh. Discussed risk of addiction and importance of no diversion. She is doing okay with 30 pills of hydrocodone for 3 months. Advised to take Tylenol daily.  Paresthesia on left lateral leg is from meralgia paresthetica. She has noticed some tingling on the bottom of right foot when she gets up to walk , she is on gabapentin   RLS: Shehas not been taking Requip, occasionally has symptoms.She saw Dr. Manuella Ghazi recently and was advised to resume medication, but did not pick it up yet.   Atopic Dermatitis: doing much better on methotrexate continues to see [facility] dermatologist. Taking folic acid.Hair loss is stable. Unchanged  Morbid Obesity: she has BMI above 35 and co-morbidities. Discussed life style modification, she gained 6 lbs in the past few months, she has been in house more often because of COVID-19    Patient Active Problem List   Diagnosis Date Noted  . Morbid obesity ([place]) 06/26/2018  . Atherosclerosis of aorta ([place]) 05/04/2017  . Pap smear abnormality of cervix with ASCUS favoring benign 04/23/2017  . Vitamin D deficiency 12/14/2016  . Obesity (BMI 30.0-34.9) 12/13/2015  . Chronic bronchitis ([place]) 05/14/2015  . DM type 2 with diabetic peripheral neuropathy ([place]) 05/14/2015  . Positive H. pylori test 04/05/2015  . Rapid urease test for Helicobacter pylori infection positive 04/05/2015  . Umbilical hernia 75/30/0511  . Exomphalos 04/01/2015  . RLS (restless legs syndrome) 01/08/2015  . Type 2 diabetes mellitus with other diabetic kidney complication ([place]) 07/16/1733  . Neurogenic claudication 01/08/2015  . AD (atopic dermatitis) 12/28/2014  . Chronic LBP 12/28/2014  . Dyslipidemia 12/28/2014  . Gastroesophageal reflux disease without esophagitis 12/28/2014  .  Microalbuminuria 12/28/2014  . Disorder of bone and cartilage 12/28/2014  . Neuralgia of left thigh 12/28/2014  . Perennial allergic rhinitis with seasonal variation 12/28/2014  . Tobacco abuse 12/28/2014  . Hypothyroidism 09/22/2005  . Hypertension, benign 09/22/2005    Past Surgical History:  Procedure Laterality Date  . CATARACT EXTRACTION Bilateral 1980  . CESAREAN  SECTION    . CORNEAL TRANSPLANT Left    [facility]-Treatment for blindness.    Family History  Problem Relation Age of Onset  . Diabetes Mother   . Hypertension Mother   . Pancreatic cancer Mother   . Alcohol abuse Father   . Arthritis/Rheumatoid Sister   . Diabetes Sister   . Kidney disease Sister   . Diabetes Brother   . Breast cancer Neg Hx     Social History   Socioeconomic History  . Marital status: Single    Spouse name: Not on file  . Number of children: 3  . Years of education: Not on file  . Highest education level: High school graduate  Occupational History  . Not on file  Social Needs  . Financial resource strain: Not very hard  . Food insecurity    Worry: Sometimes true    Inability: Never true  . Transportation needs    Medical: No    Non-medical: No  Tobacco Use  . Smoking status: Current Every Day Smoker    Packs/day: 0.10    Years: 46.00    Pack years: 4.60    Types: Cigarettes    Start date: 01/07/1985  . Smokeless tobacco: Never Used  . Tobacco comment: patient is down to 4/day.trying to quit  -used to smoke heavily in the past   Substance and Sexual Activity  . Alcohol use: No    Alcohol/week: 0.0 standard drinks  . Drug use: No  . Sexual activity: Not Currently  Lifestyle  . Physical activity    Days per week: 2 days    Minutes per session: 20 min  . Stress: Not at all  Relationships  . Social connections    Talks on phone: More than three times a week    Gets together: More than three times a week    Attends religious service: More than 4 times per year    Active member of club or organization: Yes    Attends meetings of clubs or organizations: More than 4 times per year    Relationship status: Never married  . Intimate partner violence    Fear of current or ex partner: No    Emotionally abused: No    Physically abused: No    Forced sexual activity: No  Other Topics Concern  . Not on file  Social History Narrative   Disabled from  severe atopic dermatitis   Has 3 children   Lives alone but very connect with family ( sees sister and her children daily)   Also belongs to a church     Current Outpatient Medications:  .  albuterol (VENTOLIN HFA) 108 (90 Base) MCG/ACT inhaler, Inhale 2 puffs into the lungs every 6 (six) hours as needed for wheezing or shortness of breath., Disp: 1 Inhaler, Rfl: 0 .  Alcohol Swabs (ALCOHOL PREP) PADS, , Disp: , Rfl:  .  aspirin 81 MG chewable tablet, Chew 1 tablet by mouth daily., Disp: , Rfl:  .  atorvastatin (LIPITOR) 40 MG tablet, Take 1 tablet (40 mg total) by mouth daily. In place of simvastatin, Disp: 90 tablet, Rfl: 1 .  Blood Glucose Monitoring Suppl (ONE TOUCH ULTRA MINI) w/Device KIT, 1 each by Does not apply route daily., Disp: 1 each, Rfl: 0 .  Cholecalciferol (VITAMIN D) 2000 units CAPS, Take 1 capsule (2,000 Units total) by mouth daily., Disp: 30 capsule, Rfl: 12 .  clobetasol ointment (TEMOVATE) 0.05 %, Apply to thicker areas on body twice daily until clear, Disp: , Rfl:  .  empagliflozin (JARDIANCE) 25 MG TABS tablet, Take 25 mg by mouth daily., Disp: 90 tablet, Rfl: 1 .  fluocinonide ointment (LIDEX) 0.05 %, Apply topically., Disp: , Rfl:  .  folic acid (FOLVITE) 1 MG tablet, Take by mouth., Disp: , Rfl:  .  gabapentin (NEURONTIN) 300 MG capsule, Take 1 capsule (300 mg total) by mouth 3 (three) times daily., Disp: 270 capsule, Rfl: 0 .  glucose blood test strip, Check blood sugar once daily fasting, and up to 4 times daily PRN, Disp: 100 each, Rfl: 2 .  HYDROcodone-acetaminophen (NORCO/VICODIN) 5-325 MG tablet, Take 2 tablets by mouth 2 (two) times daily as needed for moderate pain., Disp: 30 tablet, Rfl: 0 .  hydrOXYzine (ATARAX/VISTARIL) 25 MG tablet, , Disp: , Rfl:  .  Lancet Devices (SIMPLE DIAGNOSTICS LANCING DEV) MISC, Check blood sugar once daily fasting, and up to 4 times daily PRN., Disp: 100 each, Rfl: 2 .  losartan (COZAAR) 25 MG tablet, Take 1 tablet (25 mg total)  by mouth daily. In place of lisinopril, Disp: 90 tablet, Rfl: 0 .  metFORMIN (GLUCOPHAGE-XR) 750 MG 24 hr tablet, Take 2 tablets (1,500 mg total) by mouth daily with breakfast., Disp: 180 tablet, Rfl: 1 .  methotrexate (RHEUMATREX) 2.5 MG tablet, Take 15 mg by mouth once a week. , Disp: , Rfl:  .  rOPINIRole (REQUIP) 0.5 MG tablet, Take 1 tablet (0.5 mg total) by mouth at bedtime., Disp: 90 tablet, Rfl: 1 .  triamcinolone ointment (KENALOG) 0.1 %, , Disp: , Rfl:  .  umeclidinium-vilanterol (ANORO ELLIPTA) 62.5-25 MCG/INH AEPB, Inhale 1 puff into the lungs daily., Disp: 180 each, Rfl: 2  Allergies  Allergen Reactions  . Penicillins Rash    I personally reviewed active problem list, medication list, allergies, family history, social history, health maintenance with the patient/caregiver today.   ROS  Constitutional: Negative for fever or weight change.  Respiratory: positive  for cough and intermittent  shortness of breath.   Cardiovascular: Negative for chest pain or palpitations.  Gastrointestinal: Negative for abdominal pain, no bowel changes.  Musculoskeletal: positive  for intermittent gait problem or joint swelling.  Skin: positive for rash.  Neurological: Negative for dizziness or headache.  No other specific complaints in a complete review of systems (except as listed in HPI above).  Objective  Vitals:   01/21/19 1054  BP: 130/68  Pulse: 99  Resp: 16  Temp: (!) 97.5 F (36.4 C)  TempSrc: Temporal  SpO2: 97%  Weight: 177 lb 6.4 oz (80.5 kg)  Height: 4' 10"  (1.473 m)    Body mass index is 37.08 kg/m.  Physical Exam  Constitutional: Patient appears well-developed and well-nourished. Obese  No distress.  HEENT: head atraumatic, normocephalic, pupils equal and reactive to light, neck supple Cardiovascular: Normal rate, regular rhythm and normal heart sounds.  No murmur heard. No BLE edema. Pulmonary/Chest: Effort normal and breath sounds normal. No respiratory  distress. Abdominal: Soft.  There is no tenderness. Muscular Skeletal: tender during palpation of lumbar spine, normal rom  Psychiatric: Patient has a normal mood and affect. behavior is normal. Judgment  and thought content normal.  PHQ2/9: Depression screen [facility] 2/9 01/21/2019 10/21/2018 07/22/2018 06/26/2018 05/23/2018  Decreased Interest 0 0 0 0 0  Down, Depressed, Hopeless 0 0 0 0 0  PHQ - 2 Score 0 0 0 0 0  Altered sleeping 0 0 - 0 1  Tired, decreased energy 0 0 - 0 1  Change in appetite 0 0 - 0 3  Feeling bad or failure about yourself  0 0 - 0 0  Trouble concentrating 0 0 - 0 0  Moving slowly or fidgety/restless 0 0 - 0 1  Suicidal thoughts 0 0 - 0 0  PHQ-9 Score 0 0 - 0 6  Difficult doing work/chores - Not difficult at all - Not difficult at all Not difficult at all  Some recent data might be hidden    phq 9 is negative   Fall Risk: Fall Risk  01/21/2019 10/21/2018 07/22/2018 06/26/2018 05/23/2018  Falls in the past year? 0 1 0 1 1  Number falls in past yr: 0 1 0 1 1  Injury with Fall? 0 0 0 1 0  Comment - - - - -  Risk Factor Category  - - - - -  Risk for fall due to : - - - History of fall(s) History of fall(s);Impaired balance/gait  Risk for fall due to: Comment - - - - pt states right leg gave out and c/o cramps in legs and muscles and restless legs  Follow up - - - Falls evaluation completed Falls evaluation completed;Falls prevention discussed     Functional Status Survey: Is the patient deaf or have difficulty hearing?: No Does the patient have difficulty seeing, even when wearing glasses/contacts?: No Does the patient have difficulty concentrating, remembering, or making decisions?: No Does the patient have difficulty walking or climbing stairs?: No Does the patient have difficulty dressing or bathing?: No Does the patient have difficulty doing errands alone such as visiting a doctor's office or shopping?: No    Assessment & Plan   1. Vitamin D deficiency  -  Cholecalciferol (VITAMIN D) 50 MCG (2000 UT) CAPS; Take 1 capsule (2,000 Units total) by mouth daily.  Dispense: 30 capsule; Refill: 12  2. Neurogenic claudication  - HYDROcodone-acetaminophen (NORCO/VICODIN) 5-325 MG tablet; Take 2 tablets by mouth 2 (two) times daily as needed for moderate pain.  Dispense: 30 tablet; Refill: 0  3. Chronic bilateral low back pain without sciatica  - HYDROcodone-acetaminophen (NORCO/VICODIN) 5-325 MG tablet; Take 2 tablets by mouth 2 (two) times daily as needed for moderate pain.  Dispense: 30 tablet; Refill: 0  4. RLS (restless legs syndrome)  - rOPINIRole (REQUIP) 0.5 MG tablet; Take 1 tablet (0.5 mg total) by mouth at bedtime.  Dispense: 90 tablet; Refill: 1  5. Diabetes mellitus with microalbuminuria (HCC)  - POCT HgB A1C  6. Mucopurulent chronic bronchitis (HCC)  Trying to quit smoking   7. Atherosclerosis of aorta (HCC)  Continue statin and aspirin

## 2019-01-28 DIAGNOSIS — H401131 Primary open-angle glaucoma, bilateral, mild stage: Secondary | ICD-10-CM | POA: Diagnosis not present

## 2019-02-06 ENCOUNTER — Other Ambulatory Visit: Payer: Self-pay

## 2019-02-06 NOTE — Patient Outreach (Signed)
[facility]) Care Management  02/06/2019  Julie Wall 04-19-1956 [id number]   Medication Adherence call to [name] Telephone call to Patient regarding Medication Adherence unable to reach patient. [name] is showing past due on Metformin Er 750 mg,Atorvastatin 40 mg and Jardiance 25 mg under [name].  [place] Management Direct Dial [phone]  Fax [phone] [email or web address]

## 2019-02-07 ENCOUNTER — Other Ambulatory Visit: Payer: Self-pay | Admitting: Family Medicine

## 2019-02-07 DIAGNOSIS — E1142 Type 2 diabetes mellitus with diabetic polyneuropathy: Secondary | ICD-10-CM

## 2019-02-07 DIAGNOSIS — R809 Proteinuria, unspecified: Secondary | ICD-10-CM

## 2019-02-07 DIAGNOSIS — E1129 Type 2 diabetes mellitus with other diabetic kidney complication: Secondary | ICD-10-CM

## 2019-02-07 NOTE — Telephone Encounter (Signed)
Requested medication (s) are due for refill today: yes  Requested medication (s) are on the active medication list: yes  Last refill:  01/08/2019  Future visit scheduled:yes  Notes to clinic:  Sig is wrong. Per office visit 10/21/2018    Requested Prescriptions  Pending Prescriptions Disp Refills   metFORMIN (GLUCOPHAGE-XR) 750 MG 24 hr tablet [Pharmacy Med Name: METFORMIN HCL ER 750 MG TAB] 90 tablet     Sig: TAKE 1 TABLET BY MOUTH DAILY WITH BREAKFAST     Endocrinology:  Diabetes - Biguanides Passed - 02/07/2019 11:37 AM      Passed - Cr in normal range and within 360 days    Creat  Date Value Ref Range Status  10/21/2018 0.70 0.50 - 0.99 mg/dL Final    Comment:    For patients >54 years of age, the reference limit for Creatinine is approximately 13% higher for people identified as African-American. .          Passed - HBA1C is between 0 and 7.9 and within 180 days    Hemoglobin A1C  Date Value Ref Range Status  01/21/2019 7.3 (A) 4.0 - 5.6 % Final   HbA1c, POC (prediabetic range)  Date Value Ref Range Status  01/16/2018 6.5 (A) 5.7 - 6.4 % Final   HbA1c, POC (controlled diabetic range)  Date Value Ref Range Status  10/21/2018 7.3 (A) 0.0 - 7.0 % Final         Passed - eGFR in normal range and within 360 days    GFR, Est African American  Date Value Ref Range Status  10/21/2018 106 > OR = 60 mL/min/1.12m Final   GFR, Est Non African American  Date Value Ref Range Status  10/21/2018 92 > OR = 60 mL/min/1.738mFinal         Passed - Valid encounter within last 6 months    Recent Outpatient Visits          2 weeks ago [facility]   3 months [facility]in[facility]   6 months ago DM type 2 with diabetic peripheral [facility]   7 months  ago [facility]   7 months ago Cough   [facility]    Future Appointments            In 2 months SoSteele [facility]izerMD [facility] In 3 [facility]  [facility]

## 2019-03-10 DIAGNOSIS — H401131 Primary open-angle glaucoma, bilateral, mild stage: Secondary | ICD-10-CM | POA: Diagnosis not present

## 2019-04-07 ENCOUNTER — Telehealth: Payer: Self-pay

## 2019-04-07 NOTE — Telephone Encounter (Signed)
Copied from [place] [id number]. Topic: General - Other >> Apr 04, 2019 12:29 PM Antonieta Iba C wrote: Reason for CRM: pt says that she was just speaking with a nurse in the office about her medication. Pt is not sure of who she spoke to but is requesting a call back.

## 2019-04-15 ENCOUNTER — Other Ambulatory Visit: Payer: Self-pay

## 2019-04-15 DIAGNOSIS — E1129 Type 2 diabetes mellitus with other diabetic kidney complication: Secondary | ICD-10-CM

## 2019-04-15 DIAGNOSIS — E1142 Type 2 diabetes mellitus with diabetic polyneuropathy: Secondary | ICD-10-CM

## 2019-04-15 NOTE — Telephone Encounter (Signed)
Please reorder kit so we can discontinue orders from [place]. Do not sign anymore orders coming from [place] on this patient.

## 2019-04-15 NOTE — Progress Notes (Signed)
Please reorder kit so she does not have to get supplies from this [place] anymore.

## 2019-04-16 MED ORDER — ONETOUCH ULTRA MINI W/DEVICE KIT: 1.0000 | PACK | Freq: Every day | 0 refills | Status: DC

## 2019-04-17 ENCOUNTER — Encounter: Payer: Self-pay | Admitting: Family Medicine

## 2019-04-17 ENCOUNTER — Ambulatory Visit (INDEPENDENT_AMBULATORY_CARE_PROVIDER_SITE_OTHER): Payer: Medicare Other | Admitting: Family Medicine

## 2019-04-17 ENCOUNTER — Other Ambulatory Visit: Payer: Self-pay

## 2019-04-17 DIAGNOSIS — D84821 Immunodeficiency due to drugs: Secondary | ICD-10-CM | POA: Diagnosis not present

## 2019-04-17 DIAGNOSIS — E1129 Type 2 diabetes mellitus with other diabetic kidney complication: Secondary | ICD-10-CM

## 2019-04-17 DIAGNOSIS — Z79899 Other long term (current) drug therapy: Secondary | ICD-10-CM | POA: Diagnosis not present

## 2019-04-17 DIAGNOSIS — J989 Respiratory disorder, unspecified: Secondary | ICD-10-CM | POA: Diagnosis not present

## 2019-04-17 DIAGNOSIS — J410 Simple chronic bronchitis: Secondary | ICD-10-CM | POA: Diagnosis not present

## 2019-04-17 MED ORDER — GUAIFENESIN ER 600 MG PO TB12: 600.0000 mg | ORAL_TABLET | Freq: Two times a day (BID) | ORAL | 0 refills | Status: DC

## 2019-04-17 MED ORDER — FLUTICASONE PROPIONATE 50 MCG/ACT NA SUSP: 2.0000 | Freq: Every day | NASAL | 6 refills | Status: DC

## 2019-04-17 MED ORDER — LORATADINE 10 MG PO TABS: 10.0000 mg | ORAL_TABLET | Freq: Every day | ORAL | 11 refills | Status: DC

## 2019-04-17 MED ORDER — PROMETHAZINE-DM 6.25-15 MG/5ML PO SYRP: 5.0000 mL | ORAL_SOLUTION | Freq: Four times a day (QID) | ORAL | 0 refills | Status: DC | PRN

## 2019-04-17 NOTE — Progress Notes (Signed)
Name: Julie Wall   MRN: [id number]    DOB: 1955/06/10   Date:04/17/2019       Progress Note  Subjective  Chief Complaint  Chief Complaint  Patient presents with  . Cough    stuufy nose for 4 days    I connected with  Belina Rumsey on 04/17/19 at  9:40 AM EST by telephone and verified that I am speaking with the correct person using two identifiers.   I discussed the limitations, risks, security and privacy concerns of performing an evaluation and management service by telephone and the availability of in person appointments. Staff also discussed with the patient that there may be a patient responsible charge related to this service. Patient Location: Home Provider Location: Office Additional Individuals present: None  HPI  Pt presents with concern for sinus congestion making it difficult for her to breath, cough at night when she lays down and has post-nasal drainage, sometimes has chest tightness at night with minimal wheezing relieved with her albuterol inhaler..  Symptoms started 04/15/2019.  She denies fevers/chills, chest pain, changes in taste/smell, GI upset/NVD.  No known exposure to COVID-19 - goes to the store for supplies and is otherwise well quarantined.   Immunocompromised on Methotrexate: Taking on Fridays.  We will reach out to her dermatologist to see if she should skip dose tomorrow.  She is aware that this does increase her risk for severe infection.  DM: She has DM, states BG's are running in normal range.  Discussed sick day care in detail.  Patient Active Problem List   Diagnosis Date Noted  . Morbid obesity ([place]) 06/26/2018  . Atherosclerosis of aorta ([place]) 05/04/2017  . Pap smear abnormality of cervix with ASCUS favoring benign 04/23/2017  . Vitamin D deficiency 12/14/2016  . Obesity (BMI 30.0-34.9) 12/13/2015  . Chronic bronchitis ([place]) 05/14/2015  . DM type 2 with diabetic peripheral neuropathy ([place]) 05/14/2015  . Positive H. pylori test 04/05/2015   . Rapid urease test for Helicobacter pylori infection positive 04/05/2015  . Umbilical hernia 18/48/5927  . Exomphalos 04/01/2015  . RLS (restless legs syndrome) 01/08/2015  . Type 2 diabetes mellitus with other diabetic kidney complication ([place]) 63/94/3200  . Neurogenic claudication 01/08/2015  . AD (atopic dermatitis) 12/28/2014  . Chronic LBP 12/28/2014  . Dyslipidemia 12/28/2014  . Gastroesophageal reflux disease without esophagitis 12/28/2014  . Microalbuminuria 12/28/2014  . Disorder of bone and cartilage 12/28/2014  . Neuralgia of left thigh 12/28/2014  . Perennial allergic rhinitis with seasonal variation 12/28/2014  . Tobacco abuse 12/28/2014  . Hypothyroidism 09/22/2005  . Hypertension, benign 09/22/2005    Social History   Tobacco Use  . Smoking status: Current Every Day Smoker    Packs/day: 0.10    Years: 46.00    Pack years: 4.60    Types: Cigarettes    Start date: 01/07/1985  . Smokeless tobacco: Never Used  . Tobacco comment: patient is down to 4/day.trying to quit  -used to smoke heavily in the past   Substance Use Topics  . Alcohol use: No    Alcohol/week: 0.0 standard drinks     Current Outpatient Medications:  .  albuterol (VENTOLIN HFA) 108 (90 Base) MCG/ACT inhaler, Inhale 2 puffs into the lungs every 6 (six) hours as needed for wheezing or shortness of breath., Disp: 18 g, Rfl: 0 .  Alcohol Swabs (ALCOHOL PREP) PADS, , Disp: , Rfl:  .  aspirin 81 MG chewable tablet, Chew 1 tablet by mouth daily., Disp: ,  Rfl:  .  atorvastatin (LIPITOR) 40 MG tablet, Take 1 tablet (40 mg total) by mouth daily. In place of simvastatin, Disp: 90 tablet, Rfl: 1 .  Blood Glucose Monitoring Suppl (ONE TOUCH ULTRA MINI) w/Device KIT, 1 each by Does not apply route daily., Disp: 1 kit, Rfl: 0 .  Cholecalciferol (VITAMIN D) 50 MCG (2000 UT) CAPS, Take 1 capsule (2,000 Units total) by mouth daily., Disp: 30 capsule, Rfl: 12 .  clobetasol ointment (TEMOVATE) 0.05 %, Apply to  thicker areas on body twice daily until clear, Disp: , Rfl:  .  empagliflozin (JARDIANCE) 25 MG TABS tablet, Take 25 mg by mouth daily., Disp: 90 tablet, Rfl: 1 .  fluocinonide ointment (LIDEX) 0.05 %, Apply topically., Disp: , Rfl:  .  folic acid (FOLVITE) 1 MG tablet, Take by mouth., Disp: , Rfl:  .  gabapentin (NEURONTIN) 300 MG capsule, Take 1 capsule (300 mg total) by mouth 3 (three) times daily., Disp: 270 capsule, Rfl: 0 .  glucose blood test strip, Check blood sugar once daily fasting, and up to 4 times daily PRN, Disp: 100 each, Rfl: 2 .  HYDROcodone-acetaminophen (NORCO/VICODIN) 5-325 MG tablet, Take 2 tablets by mouth 2 (two) times daily as needed for moderate pain., Disp: 30 tablet, Rfl: 0 .  hydrOXYzine (ATARAX/VISTARIL) 25 MG tablet, , Disp: , Rfl:  .  Lancet Devices (SIMPLE DIAGNOSTICS LANCING DEV) MISC, Check blood sugar once daily fasting, and up to 4 times daily PRN., Disp: 100 each, Rfl: 2 .  losartan (COZAAR) 25 MG tablet, Take 1 tablet (25 mg total) by mouth daily. In place of lisinopril, Disp: 90 tablet, Rfl: 0 .  metFORMIN (GLUCOPHAGE-XR) 750 MG 24 hr tablet, TAKE 1 TABLET BY MOUTH DAILY WITH BREAKFAST, Disp: 90 tablet, Rfl: 1 .  methotrexate (RHEUMATREX) 2.5 MG tablet, Take 15 mg by mouth once a week. , Disp: , Rfl:  .  rOPINIRole (REQUIP) 0.5 MG tablet, Take 1 tablet (0.5 mg total) by mouth at bedtime., Disp: 90 tablet, Rfl: 1 .  triamcinolone ointment (KENALOG) 0.1 %, , Disp: , Rfl:  .  umeclidinium-vilanterol (ANORO ELLIPTA) 62.5-25 MCG/INH AEPB, Inhale 1 puff into the lungs daily., Disp: 180 each, Rfl: 2  Allergies  Allergen Reactions  . Penicillins Rash    I personally reviewed active problem list, medication list, allergies, notes from last encounter, lab results with the patient/caregiver today.  ROS  Ten systems reviewed and is negative except as mentioned in HPI  Objective  Virtual encounter, vitals not obtained.  There is no height or weight on file to  calculate BMI.  Nursing Note and Vital Signs reviewed.  Physical Exam  Pulmonary/Chest: Effort normal. No respiratory distress. Speaking in complete sentences Neurological: Pt is alert and oriented to person, place, and time. Coordination, speech and gait are normal.  Psychiatric: Patient has a normal mood and affect. behavior is normal. Judgment and thought content normal.  No results found for this or any previous visit (from the past 72 hour(s)).  Assessment & Plan  1. Respiratory illness - She has only had 2 days of symptoms at this point, so we will treat symptoms without antibiotics or corticosteroid.  If worsening or not improving will consider change in course.  If COVID+ - consider dexamethasone course, if COVID-, treat for COPD/chronic bronchitis flare with prednisone. - Novel Coronavirus, NAA ([facility]) - Planning to go tomorrow due to severe/inclement weather today.  2. Simple chronic bronchitis (HCC) - guaiFENesin (MUCINEX) 600 MG 12 hr tablet;  Take 1 tablet (600 mg total) by mouth 2 (two) times daily.  Dispense: 20 tablet; Refill: 0 - loratadine (CLARITIN) 10 MG tablet; Take 1 tablet (10 mg total) by mouth daily.  Dispense: 30 tablet; Refill: 11 - fluticasone (FLONASE) 50 MCG/ACT nasal spray; Place 2 sprays into both nostrils daily.  Dispense: 16 g; Refill: 6 - promethazine-dextromethorphan (PROMETHAZINE-DM) 6.25-15 MG/5ML syrup; Take 5 mLs by mouth 4 (four) times daily as needed.  Dispense: 118 mL; Refill: 0  3. Type 2 diabetes mellitus with other diabetic kidney complication (HCC) - Will monitor BG's; sick day care discussed in detail; appetite is still adequate, staying hydrated.  4. Immunocompromised state due to drug therapy - Will reach out to [facility] dermatology to see if they prefer she skip her methotrexate dose tomorrow while she is ill.  She is aware that this medication does increase her risk for severe infection.  -Red flags and when to present for emergency care  or RTC including fever >101.73F, chest pain, shortness of breath, new/worsening/un-resolving symptoms, reviewed with patient at time of visit. Follow up and care instructions discussed and provided in AVS. - I discussed the assessment and treatment plan with the patient. The patient was provided an opportunity to ask questions and all were answered. The patient agreed with the plan and demonstrated an understanding of the instructions.  - The patient was advised to call back or seek an in-person evaluation if the symptoms worsen or if the condition fails to improve as anticipated.  I provided 20 minutes of non-face-to-face time during this encounter.  Hubbard Hartshorn, FNP

## 2019-04-18 ENCOUNTER — Telehealth: Payer: Self-pay | Admitting: Family Medicine

## 2019-04-18 NOTE — Telephone Encounter (Signed)
Pt missed call.  Please call back.

## 2019-04-18 NOTE — Telephone Encounter (Signed)
Anderson Malta calling from [facility] dermotology called and stated that it would be okay to hold methotrexate (RHEUMATREX) 2.5 MG tablet [[id number]] but she states that it would be okay to take this medication with an antibiotic normally. If you have any questions please call back.

## 2019-04-18 NOTE — Telephone Encounter (Signed)
Julie Wall - please advise patient may take methotrexate today as prescribed.

## 2019-04-18 NOTE — Telephone Encounter (Signed)
Called patient. NA.

## 2019-04-21 NOTE — Telephone Encounter (Signed)
Called patient again NA 

## 2019-04-22 NOTE — Telephone Encounter (Signed)
Patient notified

## 2019-04-23 ENCOUNTER — Ambulatory Visit: Payer: Medicare Other | Admitting: Family Medicine

## 2019-04-23 DIAGNOSIS — L659 Nonscarring hair loss, unspecified: Secondary | ICD-10-CM | POA: Diagnosis not present

## 2019-04-23 DIAGNOSIS — Z79899 Other long term (current) drug therapy: Secondary | ICD-10-CM | POA: Diagnosis not present

## 2019-04-23 DIAGNOSIS — L2084 Intrinsic (allergic) eczema: Secondary | ICD-10-CM | POA: Diagnosis not present

## 2019-04-30 ENCOUNTER — Telehealth: Payer: Self-pay

## 2019-04-30 DIAGNOSIS — Z122 Encounter for screening for malignant neoplasm of respiratory organs: Secondary | ICD-10-CM

## 2019-04-30 DIAGNOSIS — Z87891 Personal history of nicotine dependence: Secondary | ICD-10-CM

## 2019-04-30 NOTE — Telephone Encounter (Signed)
Spoke with pt to inform her that it is time for her annual lung cancer screening. Confirmed smoking history (1/2 ppd for 40 years, current smoker). Pt states she has had no major health changes in the last year. Pt also has no scheduling preferences and is agreeable to having CT scan scheduled at this time.

## 2019-05-07 ENCOUNTER — Other Ambulatory Visit: Payer: Self-pay

## 2019-05-07 ENCOUNTER — Encounter: Payer: Self-pay | Admitting: Family Medicine

## 2019-05-07 ENCOUNTER — Ambulatory Visit (INDEPENDENT_AMBULATORY_CARE_PROVIDER_SITE_OTHER): Payer: Medicare Other | Admitting: Family Medicine

## 2019-05-07 VITALS — BP 120/70 | HR 96 | Temp 96.8°F | Resp 16 | Ht <= 58 in | Wt 176.7 lb

## 2019-05-07 DIAGNOSIS — R809 Proteinuria, unspecified: Secondary | ICD-10-CM

## 2019-05-07 DIAGNOSIS — Z23 Encounter for immunization: Secondary | ICD-10-CM | POA: Diagnosis not present

## 2019-05-07 DIAGNOSIS — J410 Simple chronic bronchitis: Secondary | ICD-10-CM | POA: Diagnosis not present

## 2019-05-07 DIAGNOSIS — I700 Atherosclerosis of aorta: Secondary | ICD-10-CM

## 2019-05-07 DIAGNOSIS — E1129 Type 2 diabetes mellitus with other diabetic kidney complication: Secondary | ICD-10-CM

## 2019-05-07 DIAGNOSIS — G8929 Other chronic pain: Secondary | ICD-10-CM

## 2019-05-07 DIAGNOSIS — R29818 Other symptoms and signs involving the nervous system: Secondary | ICD-10-CM

## 2019-05-07 DIAGNOSIS — E559 Vitamin D deficiency, unspecified: Secondary | ICD-10-CM

## 2019-05-07 DIAGNOSIS — E1142 Type 2 diabetes mellitus with diabetic polyneuropathy: Secondary | ICD-10-CM

## 2019-05-07 DIAGNOSIS — G2581 Restless legs syndrome: Secondary | ICD-10-CM

## 2019-05-07 DIAGNOSIS — M48062 Spinal stenosis, lumbar region with neurogenic claudication: Secondary | ICD-10-CM | POA: Diagnosis not present

## 2019-05-07 DIAGNOSIS — L2089 Other atopic dermatitis: Secondary | ICD-10-CM

## 2019-05-07 DIAGNOSIS — E785 Hyperlipidemia, unspecified: Secondary | ICD-10-CM

## 2019-05-07 DIAGNOSIS — M545 Low back pain: Secondary | ICD-10-CM

## 2019-05-07 DIAGNOSIS — G9519 Other vascular myelopathies: Secondary | ICD-10-CM

## 2019-05-07 LAB — POCT GLYCOSYLATED HEMOGLOBIN (HGB A1C): Hemoglobin A1C: 7.3 % — AB (ref 4.0–5.6)

## 2019-05-07 MED ORDER — LOSARTAN POTASSIUM 25 MG PO TABS: 25.0000 mg | ORAL_TABLET | Freq: Every day | ORAL | 1 refills | Status: DC

## 2019-05-07 MED ORDER — JARDIANCE 25 MG PO TABS: 25.0000 mg | ORAL_TABLET | Freq: Every day | ORAL | 1 refills | Status: DC

## 2019-05-07 MED ORDER — HYDROCODONE-ACETAMINOPHEN 5-325 MG PO TABS: 2.0000 | ORAL_TABLET | Freq: Two times a day (BID) | ORAL | 0 refills | Status: DC | PRN

## 2019-05-07 MED ORDER — METFORMIN HCL ER 750 MG PO TB24: 1500.0000 mg | ORAL_TABLET | Freq: Every day | ORAL | 1 refills | Status: DC

## 2019-05-07 MED ORDER — ATORVASTATIN CALCIUM 40 MG PO TABS: 40.0000 mg | ORAL_TABLET | Freq: Every day | ORAL | 1 refills | Status: DC

## 2019-05-07 MED ORDER — ANORO ELLIPTA 62.5-25 MCG/INH IN AEPB: 1.0000 | INHALATION_SPRAY | Freq: Every day | RESPIRATORY_TRACT | 2 refills | Status: DC

## 2019-05-07 MED ORDER — ROPINIROLE HCL 0.5 MG PO TABS: 0.5000 mg | ORAL_TABLET | Freq: Every day | ORAL | 1 refills | Status: DC

## 2019-05-07 NOTE — Progress Notes (Signed)
Name: Julie Wall   MRN: [id number]    DOB: [birth year]   Date:05/08/2019       Progress Note  Subjective  Chief Complaint  Chief Complaint  Patient presents with  . Follow-up    HPI  DMII with neuropathy and proteinuria: she has neurogenic claudication and history of proteinuria, but last urine micro was normal 10/2018 .  Her hgbA1C 7.5% to 6.9% ,up to 7. 2 , 7.4% 7.0% 6.6,6.8%,7.1%down to 6.5%up to 7.2%,6.7% , 7.3% today 7.3% , she has not been compliant with her diet over Thanksgiving Holiday. Denies polyphagia, polydipsia but she has noticed polyuria with Jardiance also on metforminShe is on gabapentin and states leg pain is stable but now noticing some tingling on the bottom of right foot while walking shealso takes ARB50 mg for microalbuminuria, last urine micro was back to normal She has been taking 1500 mg of Metformin daily instead of 750 mg, advised to continue current dose and resume a diabetic diet   Hyperlipidemia:she is backAtorvastatin and aspirin daily ,last LDL was at goal, HDL was low, and needs to increase intake of fish and tree nuts, also advised to walk more often .She had lung cancer screening 05/2017 and it showed aorta atherosclerosis.  COPD: she quit smoking 12/03/2016, but resumed smoking Fall 2019 l, she has been smoking more since COVID-19 . She is taking Anoro and is helping her breath.She still coughs sometimes yellow sputum, but usually clear, no wheezing at this time, SOB is intermittent and worse when she smokes  Back pain: she has back pain a few times a week, pain now5/10 at rest, but goes up to8/10 when walking, and takes hydrocodone prn, she states sometimes right leg gives out,and has radiculitis intermittent on left lower leg She denies leg weakness, no bowel or bladder incontinence, she has numbness on left lateral thigh. Discussed risk of addiction and importance of no diversion.She is doing okay with 30 pills of hydrocodone  for 3 months.Advised to take Tylenol daily. Paresthesia on left lateral leg is from meralgia paresthetica, she is still taking Gabapentin .   RLS: Sheis taking Requip prn for RLS and is stable   Atopic Dermatitis: doing much better on methotrexate continues to see [facility] dermatologist. Taking folic acid.Hair loss still present and is affecting her self -esteem  Depression: she is feeling down lately, social isolation, upset about hair loss, appetite goes up and down, does not want medications or therapy at this time  Morbid Obesity: she has BMI above 35 and co-morbidities. Discussed life style modification, her weight is stable.   Patient Active Problem List   Diagnosis Date Noted  . Morbid obesity ([place]) 06/26/2018  . Atherosclerosis of aorta ([place]) 05/04/2017  . Pap smear abnormality of cervix with ASCUS favoring benign 04/23/2017  . Vitamin D deficiency 12/14/2016  . Obesity (BMI 30.0-34.9) 12/13/2015  . Chronic bronchitis ([place]) 05/14/2015  . DM type 2 with diabetic peripheral neuropathy ([place]) 05/14/2015  . Positive H. pylori test 04/05/2015  . Rapid urease test for Helicobacter pylori infection positive 04/05/2015  . Umbilical hernia 50/93/2671  . Exomphalos 04/01/2015  . RLS (restless legs syndrome) 01/08/2015  . Type 2 diabetes mellitus with other diabetic kidney complication ([place]) 24/58/0998  . Neurogenic claudication 01/08/2015  . AD (atopic dermatitis) 12/28/2014  . Chronic LBP 12/28/2014  . Dyslipidemia 12/28/2014  . Gastroesophageal reflux disease without esophagitis 12/28/2014  . Microalbuminuria 12/28/2014  . Disorder of bone and cartilage 12/28/2014  . Neuralgia of left thigh 12/28/2014  .  Perennial allergic rhinitis with seasonal variation 12/28/2014  . Tobacco abuse 12/28/2014  . Hypothyroidism 09/22/2005  . Hypertension, benign 09/22/2005    Past Surgical History:  Procedure Laterality Date  . CATARACT EXTRACTION Bilateral 1980  . CESAREAN SECTION    .  CORNEAL TRANSPLANT Left    [facility]-Treatment for blindness.    Family History  Problem Relation Age of Onset  . Diabetes Mother   . Hypertension Mother   . Pancreatic cancer Mother   . Alcohol abuse Father   . Arthritis/Rheumatoid Sister   . Diabetes Sister   . Kidney disease Sister   . Diabetes Brother   . Breast cancer Neg Hx     Social History   Socioeconomic History  . Marital status: Single    Spouse name: Not on file  . Number of children: 3  . Years of education: Not on file  . Highest education level: High school graduate  Occupational History  . Not on file  Social Needs  . Financial resource strain: Not very hard  . Food insecurity    Worry: Sometimes true    Inability: Never true  . Transportation needs    Medical: No    Non-medical: No  Tobacco Use  . Smoking status: Current Every Day Smoker    Packs/day: 0.50    Years: 46.00    Pack years: 23.00    Types: Cigarettes    Start date: 01/07/1985  . Smokeless tobacco: Never Used  Substance and Sexual Activity  . Alcohol use: No    Alcohol/week: 0.0 standard drinks  . Drug use: No  . Sexual activity: Not Currently  Lifestyle  . Physical activity    Days per week: 2 days    Minutes per session: 20 min  . Stress: Not at all  Relationships  . Social connections    Talks on phone: More than three times a week    Gets together: More than three times a week    Attends religious service: More than 4 times per year    Active member of club or organization: Yes    Attends meetings of clubs or organizations: More than 4 times per year    Relationship status: Never married  . Intimate partner violence    Fear of current or ex partner: No    Emotionally abused: No    Physically abused: No    Forced sexual activity: No  Other Topics Concern  . Not on file  Social History Narrative   Disabled from severe atopic dermatitis   Has 3 children   Lives alone but very connect with family ( sees sister and her  children daily)   Also belongs to a church     Current Outpatient Medications:  .  albuterol (VENTOLIN HFA) 108 (90 Base) MCG/ACT inhaler, Inhale 2 puffs into the lungs every 6 (six) hours as needed for wheezing or shortness of breath., Disp: 18 g, Rfl: 0 .  Alcohol Swabs (ALCOHOL PREP) PADS, , Disp: , Rfl:  .  aspirin 81 MG chewable tablet, Chew 1 tablet by mouth daily., Disp: , Rfl:  .  atorvastatin (LIPITOR) 40 MG tablet, Take 1 tablet (40 mg total) by mouth daily. In place of simvastatin, Disp: 90 tablet, Rfl: 1 .  Blood Glucose Monitoring Suppl (ONE TOUCH ULTRA MINI) w/Device KIT, 1 each by Does not apply route daily., Disp: 1 kit, Rfl: 0 .  Cholecalciferol (VITAMIN D) 50 MCG (2000 UT) CAPS, Take 1 capsule (2,000 Units  total) by mouth daily., Disp: 30 capsule, Rfl: 12 .  clobetasol ointment (TEMOVATE) 0.05 %, Apply to thicker areas on body twice daily until clear, Disp: , Rfl:  .  empagliflozin (JARDIANCE) 25 MG TABS tablet, Take 25 mg by mouth daily., Disp: 90 tablet, Rfl: 1 .  fluocinonide ointment (LIDEX) 0.05 %, Apply topically., Disp: , Rfl:  .  fluticasone (FLONASE) 50 MCG/ACT nasal spray, Place 2 sprays into both nostrils daily., Disp: 16 g, Rfl: 6 .  folic acid (FOLVITE) 1 MG tablet, Take by mouth., Disp: , Rfl:  .  gabapentin (NEURONTIN) 300 MG capsule, Take 1 capsule (300 mg total) by mouth 3 (three) times daily., Disp: 270 capsule, Rfl: 0 .  glucose blood test strip, Check blood sugar once daily fasting, and up to 4 times daily PRN, Disp: 100 each, Rfl: 2 .  HYDROcodone-acetaminophen (NORCO/VICODIN) 5-325 MG tablet, Take 2 tablets by mouth 2 (two) times daily as needed for moderate pain., Disp: 30 tablet, Rfl: 0 .  hydrOXYzine (ATARAX/VISTARIL) 25 MG tablet, , Disp: , Rfl:  .  Lancet Devices (SIMPLE DIAGNOSTICS LANCING DEV) MISC, Check blood sugar once daily fasting, and up to 4 times daily PRN., Disp: 100 each, Rfl: 2 .  loratadine (CLARITIN) 10 MG tablet, Take 1 tablet (10 mg  total) by mouth daily., Disp: 30 tablet, Rfl: 11 .  losartan (COZAAR) 25 MG tablet, Take 1 tablet (25 mg total) by mouth daily. In place of lisinopril, Disp: 90 tablet, Rfl: 1 .  metFORMIN (GLUCOPHAGE-XR) 750 MG 24 hr tablet, Take 2 tablets (1,500 mg total) by mouth daily with breakfast., Disp: 180 tablet, Rfl: 1 .  methotrexate (RHEUMATREX) 2.5 MG tablet, Take 15 mg by mouth once a week. , Disp: , Rfl:  .  rOPINIRole (REQUIP) 0.5 MG tablet, Take 1 tablet (0.5 mg total) by mouth at bedtime., Disp: 90 tablet, Rfl: 1 .  triamcinolone ointment (KENALOG) 0.1 %, , Disp: , Rfl:  .  umeclidinium-vilanterol (ANORO ELLIPTA) 62.5-25 MCG/INH AEPB, Inhale 1 puff into the lungs daily., Disp: 180 each, Rfl: 2  Allergies  Allergen Reactions  . Penicillins Rash    I personally reviewed active problem list, medication list, allergies, family history, social history, health maintenance with the patient/caregiver today.   ROS  Constitutional: Negative for fever or weight change.  Respiratory: Negative for cough and shortness of breath.   Cardiovascular: Negative for chest pain or palpitations.  Gastrointestinal: Negative for abdominal pain, no bowel changes.  Musculoskeletal: Negative for gait problem or joint swelling.  Skin: Negative for rash.  Neurological: Negative for dizziness or headache.  No other specific complaints in a complete review of systems (except as listed in HPI above).  Objective  Vitals:   05/07/19 1347  BP: 120/70  Pulse: 96  Resp: 16  Temp: (!) 96.8 F (36 C)  TempSrc: Skin  SpO2: 94%  Weight: 176 lb 11.2 oz (80.2 kg)  Height: 4' 10"  (1.473 m)    Body mass index is 36.93 kg/m.  Physical Exam  Constitutional: Patient appears well-developed and well-nourished. Obese No distress.  HEENT: head atraumatic, normocephalic, pupils equal and reactive to light Skin: thinning hair, no bold spots, eczema under good control , just has hypopigmentation  Cardiovascular: Normal  rate, regular rhythm and normal heart sounds.  No murmur heard. No BLE edema. Pulmonary/Chest: Effort normal , mild anterior rhonchi. No respiratory distress. Abdominal: Soft.  There is no tenderness. Psychiatric: Patient has a normal mood and affect. behavior is normal.  Judgment and thought content normal. Muscular Skeletal: pain during palpation of lumbar spine, negative straight leg raise  Diabetic Foot Exam: Diabetic Foot Exam - Simple   Simple Foot Form Diabetic Foot exam was performed with the following findings: Yes 05/07/2019  2:05 PM  Visual Inspection See comments: Yes Sensation Testing Intact to touch and monofilament testing bilaterally: Yes Pulse Check Posterior Tibialis and Dorsalis pulse intact bilaterally: Yes Comments She has bunion and callus formation, thick nails     PHQ2/9: Depression screen [facility] 2/9 05/07/2019 04/17/2019 01/21/2019 10/21/2018 07/22/2018  Decreased Interest 0 0 0 0 0  Down, Depressed, Hopeless 1 0 0 0 0  PHQ - 2 Score 1 0 0 0 0  Altered sleeping 0 0 0 0 -  Tired, decreased energy 1 0 0 0 -  Change in appetite 1 0 0 0 -  Feeling bad or failure about yourself  3 0 0 0 -  Trouble concentrating 0 0 0 0 -  Moving slowly or fidgety/restless 0 0 0 0 -  Suicidal thoughts 0 0 0 0 -  PHQ-9 Score 6 0 0 0 -  Difficult doing work/chores Somewhat difficult Not difficult at all - Not difficult at all -  Some recent data might be hidden    phq 9 is positive   Fall Risk: Fall Risk  05/07/2019 04/17/2019 01/21/2019 10/21/2018 07/22/2018  Falls in the past year? 1 0 0 1 0  Number falls in past yr: 0 0 0 1 0  Injury with Fall? 0 0 0 0 0  Comment - - - - -  Risk Factor Category  - - - - -  Risk for fall due to : History of fall(s) - - - -  Risk for fall due to: Comment - - - - -  Follow up Education provided Falls evaluation completed - - -     Assessment & Plan  1. Simple chronic bronchitis (HCC)  Discussed CT Screen   2. Need for immunization  against influenza  - Flu Vaccine QUAD 36+ mos IM  3. Type 2 diabetes mellitus with other diabetic kidney complication (HCC)  - POCT HgB A1C  4. Neurogenic claudication  - HYDROcodone-acetaminophen (NORCO/VICODIN) 5-325 MG tablet; Take 2 tablets by mouth 2 (two) times daily as needed for moderate pain.  Dispense: 30 tablet; Refill: 0  5. DM type 2 with diabetic peripheral neuropathy (HCC)  - empagliflozin (JARDIANCE) 25 MG TABS tablet; Take 25 mg by mouth daily.  Dispense: 90 tablet; Refill: 1  6. Vitamin D deficiency   7. RLS (restless legs syndrome)  - rOPINIRole (REQUIP) 0.5 MG tablet; Take 1 tablet (0.5 mg total) by mouth at bedtime.  Dispense: 90 tablet; Refill: 1  8. Dyslipidemia  - atorvastatin (LIPITOR) 40 MG tablet; Take 1 tablet (40 mg total) by mouth daily. In place of simvastatin  Dispense: 90 tablet; Refill: 1  9. Morbid obesity ([place])  Discussed with the patient the risk posed by an increased BMI. Discussed importance of portion control, calorie counting and at least 150 minutes of physical activity weekly. Avoid sweet beverages and drink more water. Eat at least 6 servings of fruit and vegetables daily   10. Atherosclerosis of aorta ([place])  On statin therapy   11. Other atopic dermatitis  Keep follow up at [place]. Type 2 diabetes mellitus with microalbuminuria, without [place]-term current use of insulin (HCC)  - empagliflozin (JARDIANCE) 25 MG TABS tablet; Take 25 mg by mouth daily.  Dispense: 90 tablet; Refill: 1  13. Microalbuminuria  - losartan (COZAAR) 25 MG tablet; Take 1 tablet (25 mg total) by mouth daily. In place of lisinopril  Dispense: 90 tablet; Refill: 1  14. Diabetes mellitus with microalbuminuria (HCC)  - losartan (COZAAR) 25 MG tablet; Take 1 tablet (25 mg total) by mouth daily. In place of lisinopril  Dispense: 90 tablet; Refill: 1  16. Chronic bilateral low back pain without sciatica  - HYDROcodone-acetaminophen (NORCO/VICODIN) 5-325  MG tablet; Take 2 tablets by mouth 2 (two) times daily as needed for moderate pain.  Dispense: 30 tablet; Refill: 0

## 2019-05-08 ENCOUNTER — Telehealth: Payer: Self-pay | Admitting: *Deleted

## 2019-05-08 NOTE — Telephone Encounter (Signed)
error 

## 2019-05-08 NOTE — Addendum Note (Signed)
Addended by: Lieutenant Diego on: 05/08/2019 11:58 AM   Modules accepted: Orders

## 2019-05-08 NOTE — Telephone Encounter (Signed)
Smoking history current smoker, 35.5 pack year

## 2019-05-12 ENCOUNTER — Ambulatory Visit
Admission: RE | Admit: 2019-05-12 | Discharge: 2019-05-12 | Disposition: A | Discharge status: Home / Self Care | Payer: Medicare Other | Source: Ambulatory Visit | Attending: Nurse Practitioner | Admitting: Nurse Practitioner

## 2019-05-12 ENCOUNTER — Other Ambulatory Visit: Payer: Self-pay

## 2019-05-12 DIAGNOSIS — Z87891 Personal history of nicotine dependence: Secondary | ICD-10-CM | POA: Diagnosis not present

## 2019-05-12 DIAGNOSIS — Z122 Encounter for screening for malignant neoplasm of respiratory organs: Secondary | ICD-10-CM | POA: Insufficient documentation

## 2019-05-14 ENCOUNTER — Encounter: Payer: Self-pay | Admitting: *Deleted

## 2019-05-27 ENCOUNTER — Ambulatory Visit: Payer: Medicare Other

## 2019-06-12 ENCOUNTER — Ambulatory Visit (INDEPENDENT_AMBULATORY_CARE_PROVIDER_SITE_OTHER): Payer: Medicare Other

## 2019-06-12 VITALS — Ht <= 58 in | Wt 173.0 lb

## 2019-06-12 DIAGNOSIS — Z1231 Encounter for screening mammogram for malignant neoplasm of breast: Secondary | ICD-10-CM | POA: Diagnosis not present

## 2019-06-12 DIAGNOSIS — Z0000 Encounter for general adult medical examination without abnormal findings: Secondary | ICD-10-CM

## 2019-06-12 DIAGNOSIS — Z780 Asymptomatic menopausal state: Secondary | ICD-10-CM

## 2019-06-12 NOTE — Patient Instructions (Signed)
Julie Wall , Thank you for taking time to come for your Medicare Wellness Visit. I appreciate your ongoing commitment to your health goals. Please review the following plan we discussed and let me know if I can assist you in the future.   Screening recommendations/referrals: Colonoscopy: done 07/05/10. Repeat in 2022. Mammogram: done 11/05/17. Please call [phone] to schedule your mammogram and bone density screening. Bone Density: done 2012 Recommended yearly ophthalmology/optometry visit for glaucoma screening and checkup Recommended yearly dental visit for hygiene and checkup  Vaccinations: Influenza vaccine: done 05/07/19 Pneumococcal vaccine: done 04/01/15 Tdap vaccine: done 01/25/12 Shingles vaccine: Shingrix discussed. Please contact your pharmacy for coverage information.   Advanced directives: Advance directive discussed with you today. Even though you declined this today please call our office should you change your mind and we can give you the proper paperwork for you to fill out.  Conditions/risks identified: If you wish to quit smoking, help is available. For free tobacco cessation program offerings call the [facility] at [phone] or [facility] at 680-069-3125. You may also visit [email or web address] or email [email or web address] for more information on other programs.   Next appointment: Please follow up in one year for your Medicare Annual Wellness visit.    Preventive Care 40-64 Years, Female Preventive care refers to lifestyle choices and visits with your health care provider that can promote health and wellness. What does preventive care include?  A yearly physical exam. This is also called an annual well check.  Dental exams once or twice a year.  Routine eye exams. Ask your health care provider how often you should have your eyes checked.  Personal lifestyle choices, including:  Daily care of your teeth and gums.  Regular physical  activity.  Eating a healthy diet.  Avoiding tobacco and drug use.  Limiting alcohol use.  Practicing safe sex.  Taking low-dose aspirin daily starting at age 1.  Taking vitamin and mineral supplements as recommended by your health care provider. What happens during an annual well check? The services and screenings done by your health care provider during your annual well check will depend on your age, overall health, lifestyle risk factors, and family history of disease. Counseling  Your health care provider may ask you questions about your:  Alcohol use.  Tobacco use.  Drug use.  Emotional well-being.  Home and relationship well-being.  Sexual activity.  Eating habits.  Work and work Statistician.  Method of birth control.  Menstrual cycle.  Pregnancy history. Screening  You may have the following tests or measurements:  Height, weight, and BMI.  Blood pressure.  Lipid and cholesterol levels. These may be checked every 5 years, or more frequently if you are over 58 years old.  Skin check.  Lung cancer screening. You may have this screening every year starting at age 75 if you have a 30-pack-year history of smoking and currently smoke or have quit within the past 15 years.  Fecal occult blood test (FOBT) of the stool. You may have this test every year starting at age 73.  Flexible sigmoidoscopy or colonoscopy. You may have a sigmoidoscopy every 5 years or a colonoscopy every 10 years starting at age 4.  Hepatitis C blood test.  Hepatitis B blood test.  Sexually transmitted disease (STD) testing.  Diabetes screening. This is done by checking your blood sugar (glucose) after you have not eaten for a while (fasting). You may have this done every 1-3 years.  Mammogram. This  may be done every 1-2 years. Talk to your health care provider about when you should start having regular mammograms. This may depend on whether you have a family history of breast  cancer.  BRCA-related cancer screening. This may be done if you have a family history of breast, ovarian, tubal, or peritoneal cancers.  Pelvic exam and Pap test. This may be done every 3 years starting at age 59. Starting at age 35, this may be done every 5 years if you have a Pap test in combination with an HPV test.  Bone density scan. This is done to screen for osteoporosis. You may have this scan if you are at high risk for osteoporosis. Discuss your test results, treatment options, and if necessary, the need for more tests with your health care provider. Vaccines  Your health care provider may recommend certain vaccines, such as:  Influenza vaccine. This is recommended every year.  Tetanus, diphtheria, and acellular pertussis (Tdap, Td) vaccine. You may need a Td booster every 10 years.  Zoster vaccine. You may need this after age 33.  Pneumococcal 13-valent conjugate (PCV13) vaccine. You may need this if you have certain conditions and were not previously vaccinated.  Pneumococcal polysaccharide (PPSV23) vaccine. You may need one or two doses if you smoke cigarettes or if you have certain conditions. Talk to your health care provider about which screenings and vaccines you need and how often you need them. This information is not intended to replace advice given to you by your health care provider. Make sure you discuss any questions you have with your health care provider. Document Released: 06/18/2015 Document Revised: 02/09/2016 Document Reviewed: 03/23/2015 Elsevier Interactive Patient Education  2017 [place] Prevention in the Home Falls can cause injuries. They can happen to people of all ages. There are many things you can do to make your home safe and to help prevent falls. What can I do on the outside of my home?  Regularly fix the edges of walkways and driveways and fix any cracks.  Remove anything that might make you trip as you walk through a door,  such as a raised step or threshold.  Trim any bushes or trees on the path to your home.  Use bright outdoor lighting.  Clear any walking paths of anything that might make someone trip, such as rocks or tools.  Regularly check to see if handrails are loose or broken. Make sure that both sides of any steps have handrails.  Any raised decks and porches should have guardrails on the edges.  Have any leaves, snow, or ice cleared regularly.  Use sand or salt on walking paths during winter.  Clean up any spills in your garage right away. This includes oil or grease spills. What can I do in the bathroom?  Use night lights.  Install grab bars by the toilet and in the tub and shower. Do not use towel bars as grab bars.  Use non-skid mats or decals in the tub or shower.  If you need to sit down in the shower, use a plastic, non-slip stool.  Keep the floor dry. Clean up any water that spills on the floor as soon as it happens.  Remove soap buildup in the tub or shower regularly.  Attach bath mats securely with double-sided non-slip rug tape.  Do not have throw rugs and other things on the floor that can make you trip. What can I do in the bedroom?  Use  night lights.  Make sure that you have a light by your bed that is easy to reach.  Do not use any sheets or blankets that are too big for your bed. They should not hang down onto the floor.  Have a firm chair that has side arms. You can use this for support while you get dressed.  Do not have throw rugs and other things on the floor that can make you trip. What can I do in the kitchen?  Clean up any spills right away.  Avoid walking on wet floors.  Keep items that you use a lot in easy-to-reach places.  If you need to reach something above you, use a strong step stool that has a grab bar.  Keep electrical cords out of the way.  Do not use floor polish or wax that makes floors slippery. If you must use wax, use non-skid  floor wax.  Do not have throw rugs and other things on the floor that can make you trip. What can I do with my stairs?  Do not leave any items on the stairs.  Make sure that there are handrails on both sides of the stairs and use them. Fix handrails that are broken or loose. Make sure that handrails are as [place] as the stairways.  Check any carpeting to make sure that it is firmly attached to the stairs. Fix any carpet that is loose or worn.  Avoid having throw rugs at the top or bottom of the stairs. If you do have throw rugs, attach them to the floor with carpet tape.  Make sure that you have a light switch at the top of the stairs and the bottom of the stairs. If you do not have them, ask someone to add them for you. What else can I do to help prevent falls?  Wear shoes that:  Do not have high heels.  Have rubber bottoms.  Are comfortable and fit you well.  Are closed at the toe. Do not wear sandals.  If you use a stepladder:  Make sure that it is fully opened. Do not climb a closed stepladder.  Make sure that both sides of the stepladder are locked into place.  Ask someone to hold it for you, if possible.  Clearly mark and make sure that you can see:  Any grab bars or handrails.  First and last steps.  Where the edge of each step is.  Use tools that help you move around (mobility aids) if they are needed. These include:  Canes.  Walkers.  Scooters.  Crutches.  Turn on the lights when you go into a dark area. Replace any light bulbs as soon as they burn out.  Set up your furniture so you have a clear path. Avoid moving your furniture around.  If any of your floors are uneven, fix them.  If there are any pets around you, be aware of where they are.  Review your medicines with your doctor. Some medicines can make you feel dizzy. This can increase your chance of falling. Ask your doctor what other things that you can do to help prevent falls. This  information is not intended to replace advice given to you by your health care provider. Make sure you discuss any questions you have with your health care provider. Document Released: 03/18/2009 Document Revised: 10/28/2015 Document Reviewed: 06/26/2014 Elsevier Interactive Patient Education  2017 [employer].

## 2019-06-12 NOTE — Progress Notes (Signed)
Subjective:   Julie Wall is a 64 y.o. female who presents for Medicare Annual (Subsequent) preventive examination.  Virtual Visit via Telephone Note  I connected with Julie Wall on 06/12/19 at  8:40 AM EST by telephone and verified that I am speaking with the correct person using two identifiers.  Medicare Annual Wellness visit completed telephonically due to Covid-19 pandemic.   Location: Patient: home Provider: office   I discussed the limitations, risks, security and privacy concerns of performing an evaluation and management service by telephone and the availability of in person appointments. The patient expressed understanding and agreed to proceed.  Some vital signs may be absent or patient reported.   [name], LPN    Review of Systems:   Cardiac Risk Factors include: diabetes mellitus;dyslipidemia;hypertension;obesity (BMI >30kg/m2);smoking/ tobacco exposure     Objective:     Vitals: Ht 4' 10"  (1.473 m)   Wt 173 lb (78.5 kg)   BMI 36.16 kg/m   Body mass index is 36.16 kg/m.  Advanced Directives 06/12/2019 05/23/2018 03/16/2017 12/05/2016 06/14/2016 03/14/2016 02/09/2016  Does Patient Have a Medical Advance Directive? No No No No No No No  Would patient like information on creating a medical advance directive? No - Patient declined No - Patient declined - - - No - patient declined information No - patient declined information    Tobacco Social History   Tobacco Use  Smoking Status Current Every Day Smoker  . Packs/day: 0.50  . Years: 46.00  . Pack years: 23.00  . Types: Cigarettes  . Start date: 01/07/1985  Smokeless Tobacco Never Used  Tobacco Comment   [place] smoking cessation program information given     Ready to quit: Yes Counseling given: Yes Comment: [place] smoking cessation program information given   Clinical Intake:  Pre-visit preparation completed: Yes  Pain : No/denies pain     BMI - recorded: 36.16 Nutritional  Status: BMI > 30  Obese Nutritional Risks: None Diabetes: Yes CBG done?: No Did pt. bring in CBG monitor from home?: No   Nutrition Risk Assessment:  Has the patient had any N/V/D within the last 2 months?  No  Does the patient have any non-healing wounds?  No  Has the patient had any unintentional weight loss or weight gain?  No   Diabetes:  Is the patient diabetic?  Yes  If diabetic, was a CBG obtained today?  No  Did the patient bring in their glucometer from home?  No  How often do you monitor your CBG's? Several times a week per patient.   Financial Strains and Diabetes Management:  Are you having any financial strains with the device, your supplies or your medication? No .  Does the patient want to be seen by Chronic Care Management for management of their diabetes?  No  Would the patient like to be referred to a Nutritionist or for Diabetic Management?  No   Diabetic Exams:  Diabetic Eye Exam: Completed 06/26/18 negative retinopathy.   Diabetic Foot Exam: Completed 05/07/19.   How often do you need to have someone help you when you read instructions, pamphlets, or other written materials from your doctor or pharmacy?: 1 - Never  Interpreter Needed?: No  Information entered by :: [name] LPN  Past Medical History:  Diagnosis Date  . Allergy   . ASCUS favor benign 09/2013   negative HPV  . Atopic dermatitis    Dermatologist at [facility]  . Chronic low back pain   .  COPD (chronic obstructive pulmonary disease) ([place])   . Decreased exercise tolerance   . Dental caries   . Diabetes mellitus without complication ([place])   . GERD (gastroesophageal reflux disease)   . Hyperlipidemia   . Hypertension   . Lumbosacral neuritis   . Microalbuminuria   . Osteopenia   . Ovarian failure   . Tobacco use    Past Surgical History:  Procedure Laterality Date  . CATARACT EXTRACTION Bilateral 1980  . CESAREAN SECTION    . CORNEAL TRANSPLANT Left    [facility]-Treatment for  blindness.   Family History  Problem Relation Age of Onset  . Diabetes Mother   . Hypertension Mother   . Pancreatic cancer Mother   . Alcohol abuse Father   . Arthritis/Rheumatoid Sister   . Diabetes Sister   . Kidney disease Sister   . Diabetes Brother   . Breast cancer Neg Hx    Social History   Socioeconomic History  . Marital status: Single    Spouse name: Not on file  . Number of children: 3  . Years of education: Not on file  . Highest education level: High school graduate  Occupational History  . Not on file  Tobacco Use  . Smoking status: Current Every Day Smoker    Packs/day: 0.50    Years: 46.00    Pack years: 23.00    Types: Cigarettes    Start date: 01/07/1985  . Smokeless tobacco: Never Used  . Tobacco comment: [place] smoking cessation program information given  Substance and Sexual Activity  . Alcohol use: No    Alcohol/week: 0.0 standard drinks  . Drug use: No  . Sexual activity: Not Currently  Other Topics Concern  . Not on file  Social History Narrative   Disabled from severe atopic dermatitis   Has 3 children   Lives alone but very connect with family ( sees sister and her children daily)   Also belongs to a church   Social Determinants of Health   Financial Resource Strain: Low Risk   . Difficulty of Paying Living Expenses: Not very hard  Food Insecurity: No Food Insecurity  . Worried About Charity fundraiser in the Last Year: Never true  . Ran Out of Food in the Last Year: Never true  Transportation Needs: No Transportation Needs  . Lack of Transportation (Medical): No  . Lack of Transportation (Non-Medical): No  Physical Activity: Insufficiently Active  . Days of Exercise per Week: 2 days  . Minutes of Exercise per Session: 20 min  Stress: No Stress Concern Present  . Feeling of Stress : Not at all  Social Connections: Slightly Isolated  . Frequency of Communication with Friends and Family: More than three times a week  .  Frequency of Social Gatherings with Friends and Family: More than three times a week  . Attends Religious Services: More than 4 times per year  . Active Member of Clubs or Organizations: Yes  . Attends Archivist Meetings: More than 4 times per year  . Marital Status: Never married    Outpatient Encounter Medications as of 06/12/2019  Medication Sig  . albuterol (VENTOLIN HFA) 108 (90 Base) MCG/ACT inhaler Inhale 2 puffs into the lungs every 6 (six) hours as needed for wheezing or shortness of breath.  . Alcohol Swabs (ALCOHOL PREP) PADS   . aspirin 81 MG chewable tablet Chew 1 tablet by mouth daily.  Marland Kitchen atorvastatin (LIPITOR) 40 MG tablet Take 1 tablet (  40 mg total) by mouth daily. In place of simvastatin  . Blood Glucose Monitoring Suppl (ACCU-CHEK AVIVA PLUS) w/Device KIT   . Cholecalciferol (VITAMIN D) 50 MCG (2000 UT) CAPS Take 1 capsule (2,000 Units total) by mouth daily.  . clobetasol ointment (TEMOVATE) 0.05 % Apply to thicker areas on body twice daily until clear  . empagliflozin (JARDIANCE) 25 MG TABS tablet Take 25 mg by mouth daily.  . fluocinonide ointment (LIDEX) 0.05 % Apply topically.  . folic acid (FOLVITE) 1 MG tablet Take by mouth.  . gabapentin (NEURONTIN) 300 MG capsule Take 1 capsule (300 mg total) by mouth 3 (three) times daily.  Marland Kitchen glucose blood test strip Check blood sugar once daily fasting, and up to 4 times daily PRN (Patient taking differently: Check blood sugar once daily fasting, and up to 4 times daily PRN ACCU CHEK smartview)  . HYDROcodone-acetaminophen (NORCO/VICODIN) 5-325 MG tablet Take 2 tablets by mouth 2 (two) times daily as needed for moderate pain.  Elmore Guise Devices (SIMPLE DIAGNOSTICS LANCING DEV) MISC Check blood sugar once daily fasting, and up to 4 times daily PRN.  Marland Kitchen losartan (COZAAR) 25 MG tablet Take 1 tablet (25 mg total) by mouth daily. In place of lisinopril  . metFORMIN (GLUCOPHAGE-XR) 750 MG 24 hr tablet Take 2 tablets (1,500 mg  total) by mouth daily with breakfast.  . methotrexate (RHEUMATREX) 2.5 MG tablet Take 15 mg by mouth once a week.   Marland Kitchen rOPINIRole (REQUIP) 0.5 MG tablet Take 1 tablet (0.5 mg total) by mouth at bedtime.  . triamcinolone ointment (KENALOG) 0.1 % Apply topically. Apply to affected area twice daily as directed  . umeclidinium-vilanterol (ANORO ELLIPTA) 62.5-25 MCG/INH AEPB Inhale 1 puff into the lungs daily.  . [DISCONTINUED] glucose blood (ACCU-CHEK SMARTVIEW) test strip   . [DISCONTINUED] Accu-Chek Softclix Lancets lancets   . [DISCONTINUED] Blood Glucose Monitoring Suppl (ONE TOUCH ULTRA MINI) w/Device KIT 1 each by Does not apply route daily.  . [DISCONTINUED] fluticasone (FLONASE) 50 MCG/ACT nasal spray Place 2 sprays into both nostrils daily.  . [DISCONTINUED] hydrOXYzine (ATARAX/VISTARIL) 25 MG tablet   . [DISCONTINUED] loratadine (CLARITIN) 10 MG tablet Take 1 tablet (10 mg total) by mouth daily.  . [DISCONTINUED] triamcinolone ointment (KENALOG) 0.1 %    No facility-administered encounter medications on file as of 06/12/2019.    Activities of Daily Living In your present state of health, do you have any difficulty performing the following activities: 06/12/2019 05/07/2019  Hearing? N N  Vision? N N  Difficulty concentrating or making decisions? N N  Walking or climbing stairs? N N  Dressing or bathing? N N  Doing errands, shopping? N N  Preparing Food and eating ? N -  Using the Toilet? N -  In the past six months, have you accidently leaked urine? N -  Do you have problems with loss of bowel control? N -  Managing your Medications? N -  Managing your Finances? N -  Housekeeping or managing your Housekeeping? N -  Some recent data might be hidden    Patient Care Team: Steele Sizer, MD as PCP - General (Family Medicine) Ernesto Rutherford, MD as Referring Physician (Dermatology) Vladimir Crofts, MD as Consulting Physician (Neurology) Yolonda Kida, MD as Consulting  Physician (Cardiology)    Assessment:   This is a routine wellness examination for Jamirra.  Exercise Activities and Dietary recommendations Current Exercise Habits: Home exercise routine, Type of exercise: walking, Time (Minutes): 20, Frequency (Times/Week): 2,  Weekly Exercise (Minutes/Week): 40, Intensity: Mild, Exercise limited by: None identified  Goals    . Quit Smoking     Pt would like to stop smoking completely.       Fall Risk Fall Risk  06/12/2019 05/07/2019 04/17/2019 01/21/2019 10/21/2018  Falls in the past year? 1 1 0 0 1  Number falls in past yr: 1 0 0 0 1  Injury with Fall? 0 0 0 0 0  Comment - - - - -  Risk Factor Category  - - - - -  Risk for fall due to : History of fall(s) History of fall(s) - - -  Risk for fall due to: Comment - - - - -  Follow up Falls prevention discussed Education provided Falls evaluation completed - -   FALL RISK PREVENTION PERTAINING TO THE HOME:  Any stairs in or around the home? Yes  If so, do they handrails? Yes   Home free of loose throw rugs in walkways, pet beds, electrical cords, etc? Yes  Adequate lighting in your home to reduce risk of falls? Yes   ASSISTIVE DEVICES UTILIZED TO PREVENT FALLS:  Life alert? No  Use of a cane, walker or w/c? No  Grab bars in the bathroom? Yes  Shower chair or bench in shower? No  Elevated toilet seat or a handicapped toilet? No   DME ORDERS:  DME order needed?  No   TIMED UP AND GO:  Was the test performed? No . Telephonic visit.   Education: Fall risk prevention has been discussed.  Intervention(s) required? No   Depression Screen PHQ 2/9 Scores 06/12/2019 05/07/2019 04/17/2019 01/21/2019  PHQ - 2 Score 0 1 0 0  PHQ- 9 Score - 6 0 0  Exception Documentation - - - -  Not completed - - - -     Cognitive Function     6CIT Screen 06/12/2019 05/23/2018  What Year? 0 points 0 points  What month? 0 points 0 points  What time? 0 points 0 points  Count back from 20 0 points 0 points   Months in reverse 0 points 0 points  Repeat phrase 6 points 4 points  Total Score 6 4    Immunization History  Administered Date(s) Administered  . Influenza Split 04/09/2012  . Influenza, Seasonal, Injecte, Preservative Fre 02/17/2011  . Influenza,inj,Quad PF,6+ Mos 07/16/2013, 03/23/2014, 04/01/2015, 03/14/2016, 03/16/2017, 02/19/2018, 05/07/2019  . Influenza-Unspecified 03/23/2014  . Pneumococcal Conjugate-13 04/01/2015  . Pneumococcal Polysaccharide-23 01/26/2010  . Tdap 01/25/2012  . Zoster 01/08/2015    Qualifies for Shingles Vaccine? Yes  Zostavax completed 2016. Due for Shingrix. Education has been provided regarding the importance of this vaccine. Pt has been advised to call insurance company to determine out of pocket expense. Advised may also receive vaccine at local pharmacy or Health Dept. Verbalized acceptance and understanding.  Tdap: Up to date  Flu Vaccine: Up to date  Pneumococcal Vaccine: Up to date   Screening Tests Health Maintenance  Topic Date Due  . OPHTHALMOLOGY EXAM  06/27/2019  . HEMOGLOBIN A1C  11/05/2019  . MAMMOGRAM  11/06/2019  . PAP SMEAR-Modifier  04/23/2020  . FOOT EXAM  05/06/2020  . COLONOSCOPY  07/05/2020  . TETANUS/TDAP  01/24/2022  . INFLUENZA VACCINE  Completed  . Hepatitis C Screening  Completed  . HIV Screening  Completed    Cancer Screenings:  Colorectal Screening: Completed 07/05/10. Repeat every 10 years;   Mammogram: Completed 11/05/17. Repeat every year. Ordered today. Pt provided with  contact information and advised to call to schedule appt.   Bone Density: Completed 2012. Results reflect NORMAL. Repeat every 2 years. Ordered today. Pt provided with contact information and advised to call to schedule appt.   Lung Cancer Screening: (Low Dose CT Chest recommended if Age 91-80 years, 30 pack-year currently smoking OR have quit w/in 15years.) does not qualify.   Additional Screening:  Hepatitis C Screening: does qualify;  Completed 01/27/13  Vision Screening: Recommended annual ophthalmology exams for early detection of glaucoma and other disorders of the eye. Is the patient up to date with their annual eye exam?  Yes  Who is the provider or what is the name of the office in which the pt attends annual eye exams? [facility] Screening: Recommended annual dental exams for proper oral hygiene  Community Resource Referral:  CRR required this visit?  No      Plan:     I have personally reviewed and addressed the Medicare Annual Wellness questionnaire and have noted the following in the patient's chart:  A. Medical and social history B. Use of alcohol, tobacco or illicit drugs  C. Current medications and supplements D. Functional ability and status E.  Nutritional status F.  Physical activity G. Advance directives H. List of other physicians I.  Hospitalizations, surgeries, and ER visits in previous 12 months J.  [place] such as hearing and vision if needed, cognitive and depression L. Referrals and appointments   In addition, I have reviewed and discussed with patient certain preventive protocols, quality metrics, and best practice recommendations. A written personalized care plan for preventive services as well as general preventive health recommendations were provided to patient.   Signed,  [name], LPN Nurse Health Advisor   Nurse Notes: none

## 2019-07-16 DIAGNOSIS — H401131 Primary open-angle glaucoma, bilateral, mild stage: Secondary | ICD-10-CM | POA: Diagnosis not present

## 2019-07-23 DIAGNOSIS — M5417 Radiculopathy, lumbosacral region: Secondary | ICD-10-CM | POA: Diagnosis not present

## 2019-07-23 DIAGNOSIS — G2581 Restless legs syndrome: Secondary | ICD-10-CM | POA: Diagnosis not present

## 2019-07-23 DIAGNOSIS — E1142 Type 2 diabetes mellitus with diabetic polyneuropathy: Secondary | ICD-10-CM | POA: Diagnosis not present

## 2019-07-23 DIAGNOSIS — G5712 Meralgia paresthetica, left lower limb: Secondary | ICD-10-CM | POA: Diagnosis not present

## 2019-07-23 DIAGNOSIS — E538 Deficiency of other specified B group vitamins: Secondary | ICD-10-CM | POA: Diagnosis not present

## 2019-07-25 ENCOUNTER — Other Ambulatory Visit: Payer: Self-pay | Admitting: Neurology

## 2019-07-25 ENCOUNTER — Other Ambulatory Visit (HOSPITAL_COMMUNITY): Payer: Self-pay | Admitting: Neurology

## 2019-07-25 DIAGNOSIS — R531 Weakness: Secondary | ICD-10-CM

## 2019-08-02 ENCOUNTER — Ambulatory Visit
Admission: RE | Admit: 2019-08-02 | Discharge: 2019-08-02 | Disposition: A | Discharge status: Home / Self Care | Payer: Medicare Other | Source: Ambulatory Visit | Attending: Neurology | Admitting: Neurology

## 2019-08-02 DIAGNOSIS — M545 Low back pain: Secondary | ICD-10-CM | POA: Diagnosis not present

## 2019-08-02 DIAGNOSIS — R531 Weakness: Secondary | ICD-10-CM | POA: Insufficient documentation

## 2019-08-12 ENCOUNTER — Other Ambulatory Visit: Payer: Self-pay

## 2019-08-12 ENCOUNTER — Ambulatory Visit: Payer: Medicare Other | Attending: Neurology

## 2019-08-12 DIAGNOSIS — R262 Difficulty in walking, not elsewhere classified: Secondary | ICD-10-CM | POA: Insufficient documentation

## 2019-08-12 DIAGNOSIS — G8929 Other chronic pain: Secondary | ICD-10-CM | POA: Diagnosis not present

## 2019-08-12 DIAGNOSIS — M5441 Lumbago with sciatica, right side: Secondary | ICD-10-CM | POA: Diagnosis not present

## 2019-08-13 NOTE — Therapy (Signed)
[place] [facility] [address] [place], [address] Phone: [phone]   Fax:  [phone]  Physical Therapy Evaluation  Patient Details  Name: Julie Wall MRN: [id number] Date of Birth: 1956/03/14 Referring Provider (PT): Sherryll Burger MD   Encounter Date: 08/12/2019  PT End of Session - 08/13/19 1142    Visit Number  1    Number of Visits  13    Date for PT Re-Evaluation  09/24/19    Authorization Type  1 / 10 Medicare    PT Start Time  0945    PT Stop Time  1045    PT Time Calculation (min)  60 min       Past Medical History:  Diagnosis Date  . Allergy   . ASCUS favor benign 09/2013   negative HPV  . Atopic dermatitis    Dermatologist at [facility]  . Chronic low back pain   . COPD (chronic obstructive pulmonary disease) (HCC)   . Decreased exercise tolerance   . Dental caries   . Diabetes mellitus without complication (HCC)   . GERD (gastroesophageal reflux disease)   . Hyperlipidemia   . Hypertension   . Lumbosacral neuritis   . Microalbuminuria   . Osteopenia   . Ovarian failure   . Tobacco use     Past Surgical History:  Procedure Laterality Date  . CATARACT EXTRACTION Bilateral 1980  . CESAREAN SECTION    . CORNEAL TRANSPLANT Left    [facility] for blindness.    There were no vitals filed for this visit.   Subjective Assessment - 08/13/19 1133    Subjective  Patient reports increased LBP and radicular pain into the bottom on the foot from insidious onset >3 years prior. Patient states symptoms have been slowly worsening as time as progressed and reports she is singifcantly limited in terms of ability to stand, walk, and performing prolonged activities in standing such as meal prep, showering, and performing lifting based activities such as picking up groceries. Patient states increased difficulty overall and is unable to walk for over 10 min without having to sit down due to pain. Patient reports she  often walks for exercises and the pain has been greatly affecting her ability to perform these activities impacting her overall fitness. Patient states she is going to see her physician on 3/19 to go over her MRI results. Patient is currently an every day smoker and would like to decrease pain to feel safer in standing (reports some balance difficulties with narrow BOS standing).    Pertinent History  Diabetes Type II, Atopic Dermatitis, current everyday smoker    Limitations  Lifting;Standing;Walking    How [place] can you stand comfortably?  10 min    How [place] can you walk comfortably?  10 min    Diagnostic tests  MRI: Results not verified with patient    Patient Stated Goals  To decrease pain with walking    Currently in Pain?  No/denies    Pain Score  0-No pain   worst pain: 8/10   Pain Location  Leg    Pain Orientation  Right    Pain Descriptors / Indicators  Aching;Tingling;Pins and needles    Pain Type  Chronic pain    Pain Radiating Towards  posterior aspect of the LE into the foot    Pain Onset  More than a month ago    Pain Frequency  Intermittent    Pain Relieving Factors  sitting  Multiple Pain Sites  No         [facility] PT Assessment - 08/13/19 0001      Assessment   Medical Diagnosis  LBP    Referring Provider (PT)  Sherryll Burger MD    Onset Date/Surgical Date  06/06/15    Hand Dominance  Right    Next MD Visit  unknown    Prior Therapy  NO      Restrictions   Weight Bearing Restrictions  No      Balance Screen   Has the patient fallen in the past 6 months  No    Has the patient had a decrease in activity level because of a fear of falling?   Yes    Is the patient reluctant to leave their home because of a fear of falling?   No      Home Nurse, mental health  Private residence    Living Arrangements  Alone    Available Help at Discharge  Family;Friend(s)    Type of Home  House    Home Access  Stairs to enter    Entrance Stairs-Number of Steps  4     Entrance Stairs-Rails  Can reach both    Home Layout  One level    Home Equipment  None      Prior Function   Level of Independence  Independent    Vocation  On disability    Vocation Requirements  n/a    Leisure  walking, spedning time with faily and friends      Cognition   Overall Cognitive Status  Within Functional Limits for tasks assessed      Observation/Other Assessments   Focus on Therapeutic Outcomes (FOTO)   60      Sensation   Light Touch  Impaired by gross assessment   Diminished on R side: L3-S2     Functional Tests   Functional tests  Squat      Squat   Comments  Difficulty with performance requires use of UEs      Posture/Postural Control   Posture/Postural Control  Postural limitations    Posture Comments  Increased forward lumbar flexion increased forward rounded shoulders      ROM / Strength   AROM / PROM / Strength  AROM;Strength      AROM   AROM Assessment Site  Lumbar;Hip    Lumbar Flexion  WNL    Lumbar Extension  50% limited increased pain    Lumbar - Right Side Bend  WNL    Lumbar - Left Side Bend  WNL    Lumbar - Right Rotation  50% limited - pain    Lumbar - Left Rotation  25% limited - pain      Strength   Strength Assessment Site  Lumbar;Hip;Knee    Right/Left Hip  Left;Right    Right Hip Extension  4-/5    Right Hip External Rotation   4-/5    Right Hip Internal Rotation  4/5    Right Hip ABduction  4-/5    Right Hip ADduction  3+/5    Left Hip Flexion  4/5    Left Hip Extension  4-/5    Left Hip External Rotation  4-/5    Left Hip Internal Rotation  4/5    Left Hip ABduction  4/5    Left Hip ADduction  4/5    Right/Left Knee  Left;Right    Right Knee Flexion  4-/5  Right Knee Extension  4-/5    Left Knee Flexion  4/5    Left Knee Extension  4/5    Lumbar Flexion  5/5    Lumbar Extension  4-/5      Palpation   Spinal mobility  hypomobility centrally: S1-L3; unilaterally on R S1-L3    Palpation comment  TTP: along  gluteals and lumbar extensors on the R side      Special Tests    Special Tests  Lumbar    Lumbar Tests  Slump Test;FABER test;Prone Knee Bend Test      FABER test   findings  Positive    Side  Right    Comment  pain      Slump test   Findings  Positive    Side  --   Bilaterally   Comment  Concordant pain      Prone Knee Bend Test   Findings  Negative    Comment  --   Bilaterally     Ambulation/Gait   Gait Comments  Increased hip ER during ambulation, decreased stride length      Balance   Balance Assessed  Yes      High Level Balance   High Level Balance Activities  Other (comment)   SLS: <3sec on R       Objective measurements completed on examination: See above findings.   TREATMENT Therapeutic Exercise SLUMP glides in sitting -- x 10 Bridges in hooklying -- x 10   Performed exercises to decrease pain and improve hip strengthening      PT Education - 08/13/19 1140    Education Details  SLUMP, bridges added to HEP; POC; form/technique with exercise    Person(s) Educated  Patient    Methods  Explanation;Demonstration;Handout    Comprehension  Returned demonstration;Verbalized understanding       PT Short Term Goals - 08/13/19 1151      PT SHORT TERM GOAL #1   Title  Patient will be able to walk for 15 min before requring to sit down during the session    Baseline  10 min    Time  2    Period  Weeks    Status  New    Target Date  08/26/19        PT [place] Term Goals - 08/13/19 1152      PT Dara TERM GOAL #1   Title  Patient will have a worst pain of 4/10 to indicate a significant improvement in lumbar pain and spasms with walking    Baseline  8/10    Time  6    Period  Weeks    Status  New    Target Date  09/23/19      PT Schwalbe TERM GOAL #2   Title  Patient will improve her FOTO score from 60 to 65 to indicate significant improvement in walking and standing ability.    Baseline  60    Time  6    Period  Weeks    Status  New    Target  Date  09/23/19      PT Linzy TERM GOAL #3   Title  Patient will be able to walk a mile without requiring to sit down to be better able to walk in the grocery store    Baseline  .25 mile (estimated)    Time  6    Period  Weeks    Status  New    Target Date  09/23/19             Plan - 08/13/19 1142    Clinical Impression Statement  Patient is a 64 yo right hand dominant female presenting with increased pain along the low back with radiating symptoms into the bottom of the R foot. Patient's pain consistent with disc invovlement and lumbar stensos with patient demosntrating increased pain with moving into lumbar extension in both prone and standing. However, patient demonstrates increased weakness along her LEs B with significant weakness in the hips which can be improved with physical therapy. Patient  will beenfit from further skilled therapy focused on improving these limitations to return to prior level of function.    Personal Factors and Comorbidities  Comorbidity 3+;Fitness;Profession    Comorbidities  Disabled, diabetes II, atopic dermatitis    Examination-Activity Limitations  Lift;Squat;Stairs;Stand;Bend    Examination-Participation Restrictions  Cleaning;Community Activity    Stability/Clinical Decision Making  Evolving/Moderate complexity    Clinical Decision Making  Moderate    Rehab Potential  Fair    PT Frequency  2x / week    PT Duration  6 weeks    PT Treatment/Interventions  Electrical Stimulation;Iontophoresis 4mg /ml Dexamethasone;Biofeedback;Cryotherapy;Ultrasound;Moist Heat;Stair training;Gait training;Therapeutic activities;Therapeutic exercise;Balance training;Neuromuscular re-education;Manual techniques;Patient/family education;Passive range of motion;Dry needling;Joint Manipulations;Energy conservation    PT Next Visit Plan  hip strengthening manual therapy to improve lumbar mobility    PT Home Exercise Plan  see education section       Patient will benefit  from skilled therapeutic intervention in order to improve the following deficits and impairments:  Abnormal gait, Impaired sensation, Pain, Decreased coordination, Decreased mobility, Increased muscle spasms, Postural dysfunction, Decreased activity tolerance, Decreased endurance, Decreased range of motion, Decreased strength, Difficulty walking, Decreased balance  Visit Diagnosis: Chronic bilateral low back pain with right-sided sciatica  Difficulty in walking, not elsewhere classified     Problem List Patient Active Problem List   Diagnosis Date Noted  . Morbid obesity ([place]) 06/26/2018  . Atherosclerosis of aorta ([place]) 05/04/2017  . Pap smear abnormality of cervix with ASCUS favoring benign 04/23/2017  . Vitamin D deficiency 12/14/2016  . Obesity (BMI 30.0-34.9) 12/13/2015  . Chronic bronchitis ([place]) 05/14/2015  . DM type 2 with diabetic peripheral neuropathy ([place]) 05/14/2015  . Positive H. pylori test 04/05/2015  . Rapid urease test for Helicobacter pylori infection positive 04/05/2015  . Umbilical hernia 63/06/6008  . Exomphalos 04/01/2015  . RLS (restless legs syndrome) 01/08/2015  . Type 2 diabetes mellitus with other diabetic kidney complication ([place]) 93/23/5573  . Neurogenic claudication 01/08/2015  . AD (atopic dermatitis) 12/28/2014  . Chronic LBP 12/28/2014  . Dyslipidemia 12/28/2014  . Gastroesophageal reflux disease without esophagitis 12/28/2014  . Microalbuminuria 12/28/2014  . Disorder of bone and cartilage 12/28/2014  . Neuralgia of left thigh 12/28/2014  . Perennial allergic rhinitis with seasonal variation 12/28/2014  . Tobacco abuse 12/28/2014  . Hypothyroidism 09/22/2005  . Hypertension, benign 09/22/2005    Blythe Stanford, PT DPT 08/13/2019, 2:54 PM  [place] [facility] [address], [place], [address] Phone: [phone]   Fax:  [phone]  Name: Astra Gregg MRN: [id number] Date of  Birth: [birth year]

## 2019-08-14 ENCOUNTER — Ambulatory Visit: Payer: Medicare Other

## 2019-08-14 ENCOUNTER — Other Ambulatory Visit: Payer: Self-pay

## 2019-08-14 DIAGNOSIS — G8929 Other chronic pain: Secondary | ICD-10-CM | POA: Diagnosis not present

## 2019-08-14 DIAGNOSIS — R262 Difficulty in walking, not elsewhere classified: Secondary | ICD-10-CM | POA: Diagnosis not present

## 2019-08-14 DIAGNOSIS — M5441 Lumbago with sciatica, right side: Secondary | ICD-10-CM | POA: Diagnosis not present

## 2019-08-14 NOTE — Therapy (Signed)
[place] [facility] [address] [place], [address] Phone: [phone]   Fax:  [phone]  Physical Therapy Treatment  Patient Details  Name: Julie Wall MRN: [id number] Date of Birth: [birth year] Referring Provider (PT): Manuella Ghazi MD   Encounter Date: 08/14/2019  PT End of Session - 08/14/19 0837    Visit Number  2    Number of Visits  13    Date for PT Re-Evaluation  09/24/19    Authorization Type  2 / 10 Medicare    PT Start Time  0815    PT Stop Time  0900    PT Time Calculation (min)  45 min    Behavior During Therapy  [facility] for tasks assessed/performed       Past Medical History:  Diagnosis Date  . Allergy   . ASCUS favor benign 09/2013   negative HPV  . Atopic dermatitis    Dermatologist at [facility]  . Chronic low back pain   . COPD (chronic obstructive pulmonary disease) ([place])   . Decreased exercise tolerance   . Dental caries   . Diabetes mellitus without complication ([place])   . GERD (gastroesophageal reflux disease)   . Hyperlipidemia   . Hypertension   . Lumbosacral neuritis   . Microalbuminuria   . Osteopenia   . Ovarian failure   . Tobacco use     Past Surgical History:  Procedure Laterality Date  . CATARACT EXTRACTION Bilateral 1980  . CESAREAN SECTION    . CORNEAL TRANSPLANT Left    [facility]-Treatment for blindness.    There were no vitals filed for this visit.  Subjective Assessment - 08/14/19 0834    Subjective  Patient states she was able to walk in [employer] yesterday which she states there was a slight improvement since the previous session.    Pertinent History  Diabetes Type II, Atopic Dermatitis, current everyday smoker    Limitations  Lifting;Standing;Walking    How [place] can you stand comfortably?  10 min    How [place] can you walk comfortably?  10 min    Diagnostic tests  MRI: Results not verified with patient    Patient Stated Goals  To decrease pain with walking    Currently in  Pain?  No/denies    Pain Onset  More than a month ago          TREATMENT Therapeutic Exercise  Deadbug - x8 in hooklying LTRs - 2 x 10  Bridges in hooklying - 2 x 10 Hip abduction with GTB in hooklying- 2 x 10  Sit to stands - 3 x 10  Standing gastric stretch with foot on 2 x 4 - x 20  with 2 sec holds Ambulation with focus on improving speed - requires sitting rest breaks after 2.5 min able to perform 643ft Performed exercises to improve strength and hip/core stabilization Manual therapy  STM performed to lumbar extensors and gastroc with patient positioned in prone to decrease increased pain and spasms utilized light touch to perform as patient with increased guarding response with palpation PA lumbar mobs grade I to S1-L3 centrally and uni on R performed to decrease overall pain - x 30sec at each segment   PT Education - 08/14/19 0836    Education Details  added sit to stands to [employer]) Educated  Patient    Methods  Explanation;Demonstration    Comprehension  Verbalized understanding;Returned demonstration       PT  Short Term Goals - 08/13/19 1151      PT SHORT TERM GOAL #1   Title  Patient will be able to walk for 15 min before requring to sit down during the session    Baseline  10 min    Time  2    Period  Weeks    Status  New    Target Date  08/26/19        PT [place] Term Goals - 08/13/19 1152      PT Broecker TERM GOAL #1   Title  Patient will have a worst pain of 4/10 to indicate a significant improvement in lumbar pain and spasms with walking    Baseline  8/10    Time  6    Period  Weeks    Status  New    Target Date  09/23/19      PT [place] TERM GOAL #2   Title  Patient will improve her FOTO score from 60 to 65 to indicate significant improvement in walking and standing ability.    Baseline  60    Time  6    Period  Weeks    Status  New    Target Date  09/23/19      PT [place] TERM GOAL #3   Title  Patient will be able to walk a mile without  requiring to sit down to be better able to walk in the grocery store    Baseline  .25 mile (estimated)    Time  6    Period  Weeks    Status  New    Target Date  09/23/19            Plan - 08/14/19 0908    Clinical Impression Statement  Performed exercise to strengthen her low back and hip in predominently lumbar flexed positions. Patient demonstrates increased difficulty with standing for prolonged periods of time as well as walking (able to walk 2.5 min for a 6 min walk test). Patient is demonstrating good progress and will continue to address strength and neurodynamic limitations during the sessions. Patient will benefit from further skilled therapy to return to prior level of function.    Personal Factors and Comorbidities  Comorbidity 3+;Fitness;Profession    Comorbidities  Disabled, diabetes II, atopic dermatitis    Examination-Activity Limitations  Lift;Squat;Stairs;Stand;Bend    Examination-Participation Restrictions  Cleaning;Community Activity    Stability/Clinical Decision Making  Evolving/Moderate complexity    Rehab Potential  Fair    PT Frequency  2x / week    PT Duration  6 weeks    PT Treatment/Interventions  Electrical Stimulation;Iontophoresis 4mg /ml Dexamethasone;Biofeedback;Cryotherapy;Ultrasound;Moist Heat;Stair training;Gait training;Therapeutic activities;Therapeutic exercise;Balance training;Neuromuscular re-education;Manual techniques;Patient/family education;Passive range of motion;Dry needling;Joint Manipulations;Energy conservation    PT Next Visit Plan  hip strengthening manual therapy to improve lumbar mobility    PT Home Exercise Plan  see education section       Patient will benefit from skilled therapeutic intervention in order to improve the following deficits and impairments:  Abnormal gait, Impaired sensation, Pain, Decreased coordination, Decreased mobility, Increased muscle spasms, Postural dysfunction, Decreased activity tolerance, Decreased  endurance, Decreased range of motion, Decreased strength, Difficulty walking, Decreased balance  Visit Diagnosis: Difficulty in walking, not elsewhere classified  Chronic bilateral low back pain with right-sided sciatica     Problem List Patient Active Problem List   Diagnosis Date Noted  . Morbid obesity (HCC) 06/26/2018  . Atherosclerosis of aorta (HCC) 05/04/2017  . Pap smear abnormality  of cervix with ASCUS favoring benign 04/23/2017  . Vitamin D deficiency 12/14/2016  . Obesity (BMI 30.0-34.9) 12/13/2015  . Chronic bronchitis (HCC) 05/14/2015  . DM type 2 with diabetic peripheral neuropathy (HCC) 05/14/2015  . Positive H. pylori test 04/05/2015  . Rapid urease test for Helicobacter pylori infection positive 04/05/2015  . Umbilical hernia 04/01/2015  . Exomphalos 04/01/2015  . RLS (restless legs syndrome) 01/08/2015  . Type 2 diabetes mellitus with other diabetic kidney complication (HCC) 01/08/2015  . Neurogenic claudication 01/08/2015  . AD (atopic dermatitis) 12/28/2014  . Chronic LBP 12/28/2014  . Dyslipidemia 12/28/2014  . Gastroesophageal reflux disease without esophagitis 12/28/2014  . Microalbuminuria 12/28/2014  . Disorder of bone and cartilage 12/28/2014  . Neuralgia of left thigh 12/28/2014  . Perennial allergic rhinitis with seasonal variation 12/28/2014  . Tobacco abuse 12/28/2014  . Hypothyroidism 09/22/2005  . Hypertension, benign 09/22/2005    Myrene Galas, PT DPT 08/14/2019, 9:11 AM  [facility] [address] [place], [address] Phone: [phone]   Fax:  [phone]  Name: Julie Wall MRN: [id number] Date of Birth: Apr 20, 1956

## 2019-08-19 ENCOUNTER — Other Ambulatory Visit: Payer: Self-pay

## 2019-08-19 ENCOUNTER — Ambulatory Visit: Payer: Medicare Other

## 2019-08-19 DIAGNOSIS — R262 Difficulty in walking, not elsewhere classified: Secondary | ICD-10-CM

## 2019-08-19 DIAGNOSIS — M5441 Lumbago with sciatica, right side: Secondary | ICD-10-CM | POA: Diagnosis not present

## 2019-08-19 DIAGNOSIS — G8929 Other chronic pain: Secondary | ICD-10-CM

## 2019-08-19 NOTE — Therapy (Signed)
[place] [facility] [address], [place], [address] Phone: [phone]   Fax:  [phone]  Physical Therapy Treatment  Patient Details  Name: Julie Wall MRN: [id number] Date of Birth: 10-31-1955 Referring Provider (PT): Sherryll Burger MD   Encounter Date: 08/19/2019  PT End of Session - 08/19/19 0909    Visit Number  3    Number of Visits  13    Date for PT Re-Evaluation  09/24/19    Authorization Type  3 / 10 Medicare    PT Start Time  0900    PT Stop Time  0945    PT Time Calculation (min)  45 min    Behavior During Therapy  [facility] for tasks assessed/performed       Past Medical History:  Diagnosis Date  . Allergy   . ASCUS favor benign 09/2013   negative HPV  . Atopic dermatitis    Dermatologist at [facility]  . Chronic low back pain   . COPD (chronic obstructive pulmonary disease) (HCC)   . Decreased exercise tolerance   . Dental caries   . Diabetes mellitus without complication (HCC)   . GERD (gastroesophageal reflux disease)   . Hyperlipidemia   . Hypertension   . Lumbosacral neuritis   . Microalbuminuria   . Osteopenia   . Ovarian failure   . Tobacco use     Past Surgical History:  Procedure Laterality Date  . CATARACT EXTRACTION Bilateral 1980  . CESAREAN SECTION    . CORNEAL TRANSPLANT Left    [facility] for blindness.    There were no vitals filed for this visit.  Subjective Assessment - 08/19/19 0908    Subjective  Patient reports she was able to walk around [employer] for without an increase in pain. Patient states she believes she is improving slightly.    Pertinent History  Diabetes Type II, Atopic Dermatitis, current everyday smoker    Limitations  Lifting;Standing;Walking    How Kertesz can you stand comfortably?  10 min    How [place] can you walk comfortably?  10 min    Diagnostic tests  MRI: Results not verified with patient    Patient Stated Goals  To decrease pain with walking     Currently in Pain?  No/denies    Pain Onset  More than a month ago       TREATMENT  Therapeutic Exercise  Seated SLUMP glides - 2 x 10  Seated SLUMP sliders - 2 x 10  Hip extension in prone - 2 x 10 with pillow  Sit to stands - 2 x 10 18.5" with feet under airex pad  Ambulation with focus on improving speed - requires sitting rest breaks after 3.5 min able to perform 866ft  Hip abduction with YTB - x20  Performed exercises to improve strength and hip/core stabilization  Manual therapy  STM performed to lumbar extensors and gastroc with patient positioned in prone to decrease increased pain and spasms utilized light touch to perform as patient with increased guarding response with palpation  PA lumbar mobs grade II to S1-L3 centrally and uni on R performed to decrease overall pain - x 30sec at each segment   PT Education - 08/19/19 0908    Education Details  form/technique with exercise    Person(s) Educated  Patient    Methods  Explanation;Demonstration    Comprehension  Verbalized understanding;Returned demonstration       PT Short Term Goals - 08/13/19  1151      PT SHORT TERM GOAL #1   Title  Patient will be able to walk for 15 min before requring to sit down during the session    Baseline  10 min    Time  2    Period  Weeks    Status  New    Target Date  08/26/19        PT [place] Term Goals - 08/13/19 1152      PT [place] TERM GOAL #1   Title  Patient will have a worst pain of 4/10 to indicate a significant improvement in lumbar pain and spasms with walking    Baseline  8/10    Time  6    Period  Weeks    Status  New    Target Date  09/23/19      PT [place] TERM GOAL #2   Title  Patient will improve her FOTO score from 60 to 65 to indicate significant improvement in walking and standing ability.    Baseline  60    Time  6    Period  Weeks    Status  New    Target Date  09/23/19      PT [place] TERM GOAL #3   Title  Patient will be able to walk a mile  without requiring to sit down to be better able to walk in the grocery store    Baseline  .25 mile (estimated)    Time  6    Period  Weeks    Status  New    Target Date  09/23/19            Plan - 08/19/19 1840    Clinical Impression Statement  Patient demonstrates improvement with ability to perform 3.5 minutes of walking compared to previous sessions. Patient with decreased pain after performing manual therapy along the lumbar spine and ability to perform grade II with manual therapy. Patient will benefit from further skilled therapy focused on improving limitations to return to prior level of function.    Personal Factors and Comorbidities  Comorbidity 3+;Fitness;Profession    Comorbidities  Disabled, diabetes II, atopic dermatitis    Examination-Activity Limitations  Lift;Squat;Stairs;Stand;Bend    Examination-Participation Restrictions  Cleaning;Community Activity    Stability/Clinical Decision Making  Evolving/Moderate complexity    Rehab Potential  Fair    PT Frequency  2x / week    PT Duration  6 weeks    PT Treatment/Interventions  Electrical Stimulation;Iontophoresis 4mg /ml Dexamethasone;Biofeedback;Cryotherapy;Ultrasound;Moist Heat;Stair training;Gait training;Therapeutic activities;Therapeutic exercise;Balance training;Neuromuscular re-education;Manual techniques;Patient/family education;Passive range of motion;Dry needling;Joint Manipulations;Energy conservation    PT Next Visit Plan  hip strengthening manual therapy to improve lumbar mobility    PT Home Exercise Plan  see education section       Patient will benefit from skilled therapeutic intervention in order to improve the following deficits and impairments:  Abnormal gait, Impaired sensation, Pain, Decreased coordination, Decreased mobility, Increased muscle spasms, Postural dysfunction, Decreased activity tolerance, Decreased endurance, Decreased range of motion, Decreased strength, Difficulty walking, Decreased  balance  Visit Diagnosis: Chronic bilateral low back pain with right-sided sciatica  Difficulty in walking, not elsewhere classified     Problem List Patient Active Problem List   Diagnosis Date Noted  . Morbid obesity (HCC) 06/26/2018  . Atherosclerosis of aorta (HCC) 05/04/2017  . Pap smear abnormality of cervix with ASCUS favoring benign 04/23/2017  . Vitamin D deficiency 12/14/2016  . Obesity (BMI 30.0-34.9) 12/13/2015  . Chronic  bronchitis ([place]) 05/14/2015  . DM type 2 with diabetic peripheral neuropathy ([place]) 05/14/2015  . Positive H. pylori test 04/05/2015  . Rapid urease test for Helicobacter pylori infection positive 04/05/2015  . Umbilical hernia 28/36/6294  . Exomphalos 04/01/2015  . RLS (restless legs syndrome) 01/08/2015  . Type 2 diabetes mellitus with other diabetic kidney complication ([place]) 76/54/6503  . Neurogenic claudication 01/08/2015  . AD (atopic dermatitis) 12/28/2014  . Chronic LBP 12/28/2014  . Dyslipidemia 12/28/2014  . Gastroesophageal reflux disease without esophagitis 12/28/2014  . Microalbuminuria 12/28/2014  . Disorder of bone and cartilage 12/28/2014  . Neuralgia of left thigh 12/28/2014  . Perennial allergic rhinitis with seasonal variation 12/28/2014  . Tobacco abuse 12/28/2014  . Hypothyroidism 09/22/2005  . Hypertension, benign 09/22/2005    Blythe Stanford, PT DPT 08/19/2019, 6:50 PM  [place] [facility] [address] [place], [address] Phone: [phone]   Fax:  [phone]  Name: Julie Wall MRN: [id number] Date of Birth: 11/18/1955

## 2019-08-21 ENCOUNTER — Other Ambulatory Visit: Payer: Self-pay

## 2019-08-21 ENCOUNTER — Ambulatory Visit: Payer: Medicare Other

## 2019-08-21 DIAGNOSIS — R262 Difficulty in walking, not elsewhere classified: Secondary | ICD-10-CM

## 2019-08-21 DIAGNOSIS — M5441 Lumbago with sciatica, right side: Secondary | ICD-10-CM

## 2019-08-21 DIAGNOSIS — G8929 Other chronic pain: Secondary | ICD-10-CM

## 2019-08-21 NOTE — Therapy (Signed)
[facility] [address] [place], [address] Phone: [phone]   Fax:  [phone]  Physical Therapy Treatment  Patient Details  Name: Julie Wall MRN: [id number] Date of Birth: 04/25/1956 Referring Provider (PT): Sherryll Burger MD   Encounter Date: 08/21/2019  PT End of Session - 08/21/19 0914    Visit Number  4    Number of Visits  13    Date for PT Re-Evaluation  09/24/19    Authorization Type  4 / 10 Medicare    PT Start Time  0900    PT Stop Time  0945    PT Time Calculation (min)  45 min    Activity Tolerance  Patient tolerated treatment well    Behavior During Therapy  [facility] for tasks assessed/performed       Past Medical History:  Diagnosis Date  . Allergy   . ASCUS favor benign 09/2013   negative HPV  . Atopic dermatitis    Dermatologist at [facility]  . Chronic low back pain   . COPD (chronic obstructive pulmonary disease) (HCC)   . Decreased exercise tolerance   . Dental caries   . Diabetes mellitus without complication (HCC)   . GERD (gastroesophageal reflux disease)   . Hyperlipidemia   . Hypertension   . Lumbosacral neuritis   . Microalbuminuria   . Osteopenia   . Ovarian failure   . Tobacco use     Past Surgical History:  Procedure Laterality Date  . CATARACT EXTRACTION Bilateral 1980  . CESAREAN SECTION    . CORNEAL TRANSPLANT Left    [facility] for blindness.    There were no vitals filed for this visit.  Subjective Assessment - 08/21/19 0909    Subjective  Patient states she went walking with her daughter all day yesterday and hurt by the end of the day.    Pertinent History  Diabetes Type II, Atopic Dermatitis, current everyday smoker    Limitations  Lifting;Standing;Walking    How Garn can you stand comfortably?  10 min    How Muff can you walk comfortably?  10 min    Diagnostic tests  MRI: Results not verified with patient    Patient Stated Goals  To decrease pain with  walking    Currently in Pain?  No/denies    Pain Onset  More than a month ago         TREATMENT   Therapeutic Exercise   Seated SLUMP glides - 2 x 10 Ambulation with focus on improving speed - requires sitting rest breaks after 3.75 min able to perform 812ft SL [place] deadlift -  2 x 10 B with 2 x 4 Sit to stands - 2 x 10 18.5" with feet under airex pad Calf stretch in standing with 2 X4 - 20sec holds  Hip extension in prone - 2 x 10 with pillow Hip abduction with YTB - 2 x 10    Performed exercises to improve strength and hip/core stabilization   Manual therapy   STM performed to lumbar extensors and gastroc with patient positioned in prone to decrease increased pain and spasms utilized light touch to perform as patient with increased guarding response with palpation   PA lumbar mobs grade III to S1-L3 centrally qR performed to decrease overall pain 2- x 30sec at each segment   PT Education - 08/21/19 0913    Education Details  form/technique with exercise    Person(s) Educated  Patient  Methods  Explanation;Demonstration    Comprehension  Verbalized understanding;Returned demonstration       PT Short Term Goals - 08/13/19 1151      PT SHORT TERM GOAL #1   Title  Patient will be able to walk for 15 min before requring to sit down during the session    Baseline  10 min    Time  2    Period  Weeks    Status  New    Target Date  08/26/19        PT [place] Term Goals - 08/13/19 1152      PT [place] TERM GOAL #1   Title  Patient will have a worst pain of 4/10 to indicate a significant improvement in lumbar pain and spasms with walking    Baseline  8/10    Time  6    Period  Weeks    Status  New    Target Date  09/23/19      PT [place] TERM GOAL #2   Title  Patient will improve her FOTO score from 60 to 65 to indicate significant improvement in walking and standing ability.    Baseline  60    Time  6    Period  Weeks    Status  New    Target Date  09/23/19       PT Dai TERM GOAL #3   Title  Patient will be able to walk a mile without requiring to sit down to be better able to walk in the grocery store    Baseline  .25 mile (estimated)    Time  6    Period  Weeks    Status  New    Target Date  09/23/19            Plan - 08/21/19 0925    Clinical Impression Statement  Focused on performing greater amount of standing exercises to improve fatigue and nerve dynamics in standing.  Patient demosntrates improvement with this with ability to perform more exercises in standing compared to previous sessions indicating fucntional carryover between visits. Patient able to tolerate grade III mobilizations indicating further improvement. Although patient is improving she continues to have increased difficulty with ambulation and will benefit from further skilled therapy to return to prior level of  function.    Personal Factors and Comorbidities  Comorbidity 3+;Fitness;Profession    Comorbidities  Disabled, diabetes II, atopic dermatitis    Examination-Activity Limitations  Lift;Squat;Stairs;Stand;Bend    Examination-Participation Restrictions  Cleaning;Community Activity    Stability/Clinical Decision Making  Evolving/Moderate complexity    Rehab Potential  Fair    PT Frequency  2x / week    PT Duration  6 weeks    PT Treatment/Interventions  Electrical Stimulation;Iontophoresis 4mg /ml Dexamethasone;Biofeedback;Cryotherapy;Ultrasound;Moist Heat;Stair training;Gait training;Therapeutic activities;Therapeutic exercise;Balance training;Neuromuscular re-education;Manual techniques;Patient/family education;Passive range of motion;Dry needling;Joint Manipulations;Energy conservation    PT Next Visit Plan  hip strengthening manual therapy to improve lumbar mobility    PT Home Exercise Plan  see education section       Patient will benefit from skilled therapeutic intervention in order to improve the following deficits and impairments:  Abnormal gait, Impaired  sensation, Pain, Decreased coordination, Decreased mobility, Increased muscle spasms, Postural dysfunction, Decreased activity tolerance, Decreased endurance, Decreased range of motion, Decreased strength, Difficulty walking, Decreased balance  Visit Diagnosis: Chronic bilateral low back pain with right-sided sciatica  Difficulty in walking, not elsewhere classified     Problem List Patient Active Problem List  Diagnosis Date Noted  . Morbid obesity (HCC) 06/26/2018  . Atherosclerosis of aorta (HCC) 05/04/2017  . Pap smear abnormality of cervix with ASCUS favoring benign 04/23/2017  . Vitamin D deficiency 12/14/2016  . Obesity (BMI 30.0-34.9) 12/13/2015  . Chronic bronchitis (HCC) 05/14/2015  . DM type 2 with diabetic peripheral neuropathy (HCC) 05/14/2015  . Positive H. pylori test 04/05/2015  . Rapid urease test for Helicobacter pylori infection positive 04/05/2015  . Umbilical hernia 04/01/2015  . Exomphalos 04/01/2015  . RLS (restless legs syndrome) 01/08/2015  . Type 2 diabetes mellitus with other diabetic kidney complication (HCC) 01/08/2015  . Neurogenic claudication 01/08/2015  . AD (atopic dermatitis) 12/28/2014  . Chronic LBP 12/28/2014  . Dyslipidemia 12/28/2014  . Gastroesophageal reflux disease without esophagitis 12/28/2014  . Microalbuminuria 12/28/2014  . Disorder of bone and cartilage 12/28/2014  . Neuralgia of left thigh 12/28/2014  . Perennial allergic rhinitis with seasonal variation 12/28/2014  . Tobacco abuse 12/28/2014  . Hypothyroidism 09/22/2005  . Hypertension, benign 09/22/2005    Myrene Galas, PT DPT 08/21/2019, 9:47 AM  [place] [facility] [address] [place], [address] Phone: [phone]   Fax:  [phone]  Name: Julie Wall MRN: [id number] Date of Birth: 1956/03/29

## 2019-08-22 DIAGNOSIS — R531 Weakness: Secondary | ICD-10-CM | POA: Diagnosis not present

## 2019-08-22 DIAGNOSIS — E559 Vitamin D deficiency, unspecified: Secondary | ICD-10-CM | POA: Diagnosis not present

## 2019-08-22 DIAGNOSIS — Z79899 Other long term (current) drug therapy: Secondary | ICD-10-CM | POA: Diagnosis not present

## 2019-08-22 DIAGNOSIS — E538 Deficiency of other specified B group vitamins: Secondary | ICD-10-CM | POA: Diagnosis not present

## 2019-08-25 ENCOUNTER — Other Ambulatory Visit: Payer: Self-pay | Admitting: Neurology

## 2019-08-25 DIAGNOSIS — R531 Weakness: Secondary | ICD-10-CM

## 2019-08-26 ENCOUNTER — Other Ambulatory Visit: Payer: Self-pay

## 2019-08-26 ENCOUNTER — Ambulatory Visit: Payer: Medicare Other

## 2019-08-26 DIAGNOSIS — G8929 Other chronic pain: Secondary | ICD-10-CM

## 2019-08-26 DIAGNOSIS — R262 Difficulty in walking, not elsewhere classified: Secondary | ICD-10-CM | POA: Diagnosis not present

## 2019-08-26 DIAGNOSIS — M5441 Lumbago with sciatica, right side: Secondary | ICD-10-CM | POA: Diagnosis not present

## 2019-08-26 NOTE — Therapy (Signed)
[place] [facility] [address], [place], [address] Phone: [phone]   Fax:  [phone]  Physical Therapy Treatment  Patient Details  Name: Julie Wall MRN: [id number] Date of Birth: [birth year] Referring Provider (PT): Sherryll Burger MD   Encounter Date: 08/26/2019  PT End of Session - 08/26/19 0852    Visit Number  5    Number of Visits  13    Date for PT Re-Evaluation  09/24/19    Authorization Type  5 / 10 Medicare    PT Start Time  0845    PT Stop Time  0930    PT Time Calculation (min)  45 min    Activity Tolerance  Patient tolerated treatment well    Behavior During Therapy  [facility] for tasks assessed/performed       Past Medical History:  Diagnosis Date  . Allergy   . ASCUS favor benign 09/2013   negative HPV  . Atopic dermatitis    Dermatologist at [facility]  . Chronic low back pain   . COPD (chronic obstructive pulmonary disease) (HCC)   . Decreased exercise tolerance   . Dental caries   . Diabetes mellitus without complication (HCC)   . GERD (gastroesophageal reflux disease)   . Hyperlipidemia   . Hypertension   . Lumbosacral neuritis   . Microalbuminuria   . Osteopenia   . Ovarian failure   . Tobacco use     Past Surgical History:  Procedure Laterality Date  . CATARACT EXTRACTION Bilateral 1980  . CESAREAN SECTION    . CORNEAL TRANSPLANT Left    [facility] for blindness.    There were no vitals filed for this visit.  Subjective Assessment - 08/26/19 0849    Subjective  Patient states she is feeling good today and reports she had a good weekend of spending time with family.    Pertinent History  Diabetes Type II, Atopic Dermatitis, current everyday smoker    Limitations  Lifting;Standing;Walking    How [place] can you stand comfortably?  10 min    How [place] can you walk comfortably?  10 min    Diagnostic tests  MRI: Results not verified with patient    Patient Stated Goals  To decrease pain with  walking    Currently in Pain?  No/denies    Pain Onset  More than a month ago          TREATMENT   Therapeutic Exercise   Seated SLUMP glides - x25  SLR in sitting with use of towel for gastric stretch and sciatic activation Ambulation with focus on improving speed - requires sitting rest breaks after 4 min able to perform 1037ft SL [place] deadlift -  2 x 10 B with 2 x 4 Calf stretch in standing with 2 X4 - 20sec holds  Heel lifts 2 up 1 down off a step - x10   Hip circles in standing with YTB - 2 x 10  Monster walks with YTB around ankles - 2 x 10    Performed exercises to improve strength and hip/core stabilization   Manual therapy   STM performed to lumbar extensors and gastroc with patient positioned in prone to decrease increased pain and spasms utilized light touch to perform as patient with increased guarding response with palpation   PA lumbar mobs grade III to S1-L4 centrally performed to decrease overall pain 2- x 30sec at each segment  PT Education - 08/26/19 7322  Education Details  form/technique with exercise; reinforced SLUMP exercise    Person(s) Educated  Patient    Methods  Explanation;Demonstration    Comprehension  Verbalized understanding;Returned demonstration       PT Short Term Goals - 08/13/19 1151      PT SHORT TERM GOAL #1   Title  Patient will be able to walk for 15 min before requring to sit down during the session    Baseline  10 min    Time  2    Period  Weeks    Status  New    Target Date  08/26/19        PT [place] Term Goals - 08/13/19 1152      PT [place] TERM GOAL #1   Title  Patient will have a worst pain of 4/10 to indicate a significant improvement in lumbar pain and spasms with walking    Baseline  8/10    Time  6    Period  Weeks    Status  New    Target Date  09/23/19      PT [place] TERM GOAL #2   Title  Patient will improve her FOTO score from 60 to 65 to indicate significant improvement in walking and standing  ability.    Baseline  60    Time  6    Period  Weeks    Status  New    Target Date  09/23/19      PT [place] TERM GOAL #3   Title  Patient will be able to walk a mile without requiring to sit down to be better able to walk in the grocery store    Baseline  .25 mile (estimated)    Time  6    Period  Weeks    Status  New    Target Date  09/23/19            Plan - 08/26/19 0916    Clinical Impression Statement  Patient making progress towards [place] term goals with greater ability to perform walking without having to sit  down to start the session. Also able to progress ROM with SLUMP motions and performing single leg deadlift without increase in pain. Patient conitnues to have increased stiffness with prolonged standing and walking. Patient will benefit from further skilled therapy focused on improving limitations to return to prior level of function.    Personal Factors and Comorbidities  Comorbidity 3+;Fitness;Profession    Comorbidities  Disabled, diabetes II, atopic dermatitis    Examination-Activity Limitations  Lift;Squat;Stairs;Stand;Bend    Examination-Participation Restrictions  Cleaning;Community Activity    Stability/Clinical Decision Making  Evolving/Moderate complexity    Rehab Potential  Fair    PT Frequency  2x / week    PT Duration  6 weeks    PT Treatment/Interventions  Electrical Stimulation;Iontophoresis 4mg /ml Dexamethasone;Biofeedback;Cryotherapy;Ultrasound;Moist Heat;Stair training;Gait training;Therapeutic activities;Therapeutic exercise;Balance training;Neuromuscular re-education;Manual techniques;Patient/family education;Passive range of motion;Dry needling;Joint Manipulations;Energy conservation    PT Next Visit Plan  hip strengthening manual therapy to improve lumbar mobility    PT Home Exercise Plan  see education section       Patient will benefit from skilled therapeutic intervention in order to improve the following deficits and impairments:  Abnormal  gait, Impaired sensation, Pain, Decreased coordination, Decreased mobility, Increased muscle spasms, Postural dysfunction, Decreased activity tolerance, Decreased endurance, Decreased range of motion, Decreased strength, Difficulty walking, Decreased balance  Visit Diagnosis: Chronic bilateral low back pain with right-sided sciatica  Difficulty in walking, not  elsewhere classified     Problem List Patient Active Problem List   Diagnosis Date Noted  . Morbid obesity ([place]) 06/26/2018  . Atherosclerosis of aorta ([place]) 05/04/2017  . Pap smear abnormality of cervix with ASCUS favoring benign 04/23/2017  . Vitamin D deficiency 12/14/2016  . Obesity (BMI 30.0-34.9) 12/13/2015  . Chronic bronchitis ([place]) 05/14/2015  . DM type 2 with diabetic peripheral neuropathy ([place]) 05/14/2015  . Positive H. pylori test 04/05/2015  . Rapid urease test for Helicobacter pylori infection positive 04/05/2015  . Umbilical hernia 72/53/6644  . Exomphalos 04/01/2015  . RLS (restless legs syndrome) 01/08/2015  . Type 2 diabetes mellitus with other diabetic kidney complication ([place]) 03/47/4259  . Neurogenic claudication 01/08/2015  . AD (atopic dermatitis) 12/28/2014  . Chronic LBP 12/28/2014  . Dyslipidemia 12/28/2014  . Gastroesophageal reflux disease without esophagitis 12/28/2014  . Microalbuminuria 12/28/2014  . Disorder of bone and cartilage 12/28/2014  . Neuralgia of left thigh 12/28/2014  . Perennial allergic rhinitis with seasonal variation 12/28/2014  . Tobacco abuse 12/28/2014  . Hypothyroidism 09/22/2005  . Hypertension, benign 09/22/2005    Blythe Stanford, PT DPT 08/26/2019, 9:38 AM  [place] [facility] [address], [place], [address] Phone: [phone]   Fax:  [phone]  Name: Minda Faas MRN: [id number] Date of Birth: 1955-06-20

## 2019-08-28 ENCOUNTER — Ambulatory Visit: Payer: Medicare Other

## 2019-08-28 ENCOUNTER — Other Ambulatory Visit: Payer: Self-pay

## 2019-08-28 DIAGNOSIS — R262 Difficulty in walking, not elsewhere classified: Secondary | ICD-10-CM | POA: Diagnosis not present

## 2019-08-28 DIAGNOSIS — M5441 Lumbago with sciatica, right side: Secondary | ICD-10-CM | POA: Diagnosis not present

## 2019-08-28 DIAGNOSIS — G8929 Other chronic pain: Secondary | ICD-10-CM

## 2019-08-28 NOTE — Therapy (Signed)
[place] [facility] [address], [place], [address] Phone: [phone]   Fax:  [phone]  Physical Therapy Treatment  Patient Details  Name: Julie Wall MRN: [id number] Date of Birth: 11-Jan-1956 Referring Provider (PT): Sherryll Burger MD   Encounter Date: 08/28/2019  PT End of Session - 08/28/19 2122    Visit Number  6    Number of Visits  13    Date for PT Re-Evaluation  09/24/19    Authorization Type  6 / 10 Medicare    PT Start Time  0900    PT Stop Time  0945    PT Time Calculation (min)  45 min    Activity Tolerance  Patient tolerated treatment well    Behavior During Therapy  [place] for tasks assessed/performed       Past Medical History:  Diagnosis Date  . Allergy   . ASCUS favor benign 09/2013   negative HPV  . Atopic dermatitis    Dermatologist at [facility]  . Chronic low back pain   . COPD (chronic obstructive pulmonary disease) (HCC)   . Decreased exercise tolerance   . Dental caries   . Diabetes mellitus without complication (HCC)   . GERD (gastroesophageal reflux disease)   . Hyperlipidemia   . Hypertension   . Lumbosacral neuritis   . Microalbuminuria   . Osteopenia   . Ovarian failure   . Tobacco use     Past Surgical History:  Procedure Laterality Date  . CATARACT EXTRACTION Bilateral 1980  . CESAREAN SECTION    . CORNEAL TRANSPLANT Left    [facility] for blindness.    There were no vitals filed for this visit.  Subjective Assessment - 08/28/19 0911    Subjective  Patient reports she has been performing her exercises and has been walking for exercise    Pertinent History  Diabetes Type II, Atopic Dermatitis, current everyday smoker    Limitations  Lifting;Standing;Walking    How [place] can you stand comfortably?  10 min    How [place] can you walk comfortably?  10 min    Diagnostic tests  MRI: Results not verified with patient    Patient Stated Goals  To decrease pain with walking     Currently in Pain?  No/denies    Pain Onset  More than a month ago         TREATMENT   Therapeutic Exercise   Seated SLUMP glides - x25 with foot supported on stool Ambulation with focus on improving speed - requires sitting rest breaks after 6 min able to perform 1374ft Pelvic tilts in sitting - x 20  SL [place] deadlift -  2 x 10 B with ramp Straight leg hip extension - x 10 Calf stretch in standing with 2 X4 - 20sec holds  Monster walks with YTB around ankles - 2 x 10    Performed exercises to improve strength and hip/core stabilization   Manual therapy   STM performed to lumbar extensors  with patient positioned in prone to decrease increased pain and spasms utilized light touch to perform as patient with increased guarding response with palpation   PT Education - 08/28/19 0919    Education Details  form/technique with exercise; importance of walking    Person(s) Educated  Patient    Methods  Explanation;Demonstration    Comprehension  Verbalized understanding;Returned demonstration       PT Short Term Goals - 08/13/19 1151  PT SHORT TERM GOAL #1   Title  Patient will be able to walk for 15 min before requring to sit down during the session    Baseline  10 min    Time  2    Period  Weeks    Status  New    Target Date  08/26/19        PT [place] Term Goals - 08/13/19 1152      PT [place] TERM GOAL #1   Title  Patient will have a worst pain of 4/10 to indicate a significant improvement in lumbar pain and spasms with walking    Baseline  8/10    Time  6    Period  Weeks    Status  New    Target Date  09/23/19      PT [place] TERM GOAL #2   Title  Patient will improve her FOTO score from 60 to 65 to indicate significant improvement in walking and standing ability.    Baseline  60    Time  6    Period  Weeks    Status  New    Target Date  09/23/19      PT [place] TERM GOAL #3   Title  Patient will be able to walk a mile without requiring to sit down to be  better able to walk in the grocery store    Baseline  .25 mile (estimated)    Time  6    Period  Weeks    Status  New    Target Date  09/23/19            Plan - 08/28/19 8453    Clinical Impression Statement  More muscular guarding today versus pervious visits however able to perform a higher grade PA pressure indicating improvement to joint mobility. Continued to address hip and lumbar strength throuhgout today's session, patient making improvement with this and walking and will benefit from further skilled therapy to return to prior level of function.    Personal Factors and Comorbidities  Comorbidity 3+;Fitness;Profession    Comorbidities  Disabled, diabetes II, atopic dermatitis    Examination-Activity Limitations  Lift;Squat;Stairs;Stand;Bend    Examination-Participation Restrictions  Cleaning;Community Activity    Stability/Clinical Decision Making  Evolving/Moderate complexity    Rehab Potential  Fair    PT Frequency  2x / week    PT Duration  6 weeks    PT Treatment/Interventions  Electrical Stimulation;Iontophoresis 4mg /ml Dexamethasone;Biofeedback;Cryotherapy;Ultrasound;Moist Heat;Stair training;Gait training;Therapeutic activities;Therapeutic exercise;Balance training;Neuromuscular re-education;Manual techniques;Patient/family education;Passive range of motion;Dry needling;Joint Manipulations;Energy conservation    PT Next Visit Plan  hip strengthening manual therapy to improve lumbar mobility    PT Home Exercise Plan  see education section       Patient will benefit from skilled therapeutic intervention in order to improve the following deficits and impairments:  Abnormal gait, Impaired sensation, Pain, Decreased coordination, Decreased mobility, Increased muscle spasms, Postural dysfunction, Decreased activity tolerance, Decreased endurance, Decreased range of motion, Decreased strength, Difficulty walking, Decreased balance  Visit Diagnosis: Chronic bilateral low back  pain with right-sided sciatica  Difficulty in walking, not elsewhere classified     Problem List Patient Active Problem List   Diagnosis Date Noted  . Morbid obesity (HCC) 06/26/2018  . Atherosclerosis of aorta (HCC) 05/04/2017  . Pap smear abnormality of cervix with ASCUS favoring benign 04/23/2017  . Vitamin D deficiency 12/14/2016  . Obesity (BMI 30.0-34.9) 12/13/2015  . Chronic bronchitis (HCC) 05/14/2015  . DM type 2  with diabetic peripheral neuropathy ([place]) 05/14/2015  . Positive H. pylori test 04/05/2015  . Rapid urease test for Helicobacter pylori infection positive 04/05/2015  . Umbilical hernia 73/42/8768  . Exomphalos 04/01/2015  . RLS (restless legs syndrome) 01/08/2015  . Type 2 diabetes mellitus with other diabetic kidney complication ([place]) 11/57/2620  . Neurogenic claudication 01/08/2015  . AD (atopic dermatitis) 12/28/2014  . Chronic LBP 12/28/2014  . Dyslipidemia 12/28/2014  . Gastroesophageal reflux disease without esophagitis 12/28/2014  . Microalbuminuria 12/28/2014  . Disorder of bone and cartilage 12/28/2014  . Neuralgia of left thigh 12/28/2014  . Perennial allergic rhinitis with seasonal variation 12/28/2014  . Tobacco abuse 12/28/2014  . Hypothyroidism 09/22/2005  . Hypertension, benign 09/22/2005    Blythe Stanford, PT DPT 08/28/2019, 9:46 AM  [place] [facility] [address], [place], [address] Phone: [phone]   Fax:  [phone]  Name: Julie Wall MRN: [id number] Date of Birth: [birth year]

## 2019-09-02 ENCOUNTER — Ambulatory Visit: Payer: Medicare Other

## 2019-09-02 ENCOUNTER — Other Ambulatory Visit: Payer: Self-pay

## 2019-09-02 DIAGNOSIS — G8929 Other chronic pain: Secondary | ICD-10-CM

## 2019-09-02 DIAGNOSIS — M5441 Lumbago with sciatica, right side: Secondary | ICD-10-CM | POA: Diagnosis not present

## 2019-09-02 DIAGNOSIS — R262 Difficulty in walking, not elsewhere classified: Secondary | ICD-10-CM | POA: Diagnosis not present

## 2019-09-02 NOTE — Therapy (Signed)
[place] [facility] [address], [place], [address] Phone: [phone]   Fax:  [phone]  Physical Therapy Treatment  Patient Details  Name: Julie Wall MRN: [id number] Date of Birth: 1956/02/09 Referring Provider (PT): Sherryll Burger MD   Encounter Date: 09/02/2019  PT End of Session - 09/02/19 0920    Visit Number  7    Number of Visits  13    Date for PT Re-Evaluation  09/24/19    Authorization Type  7 / 10 Medicare    PT Start Time  0900    PT Stop Time  0945    PT Time Calculation (min)  45 min    Activity Tolerance  Patient tolerated treatment well    Behavior During Therapy  [facility] for tasks assessed/performed       Past Medical History:  Diagnosis Date  . Allergy   . ASCUS favor benign 09/2013   negative HPV  . Atopic dermatitis    Dermatologist at [facility]  . Chronic low back pain   . COPD (chronic obstructive pulmonary disease) (HCC)   . Decreased exercise tolerance   . Dental caries   . Diabetes mellitus without complication (HCC)   . GERD (gastroesophageal reflux disease)   . Hyperlipidemia   . Hypertension   . Lumbosacral neuritis   . Microalbuminuria   . Osteopenia   . Ovarian failure   . Tobacco use     Past Surgical History:  Procedure Laterality Date  . CATARACT EXTRACTION Bilateral 1980  . CESAREAN SECTION    . CORNEAL TRANSPLANT Left    [facility] for blindness.    There were no vitals filed for this visit.  Subjective Assessment - 09/02/19 0911    Subjective  Patient states her calf has been improving overall states she's been able to walk for longer periods.    Pertinent History  Diabetes Type II, Atopic Dermatitis, current everyday smoker    Limitations  Lifting;Standing;Walking    How S[place] can you stand comfortably?  10 min    How Speciale can you walk comfortably?  10 min    Diagnostic tests  MRI: Results not verified with patient    Patient Stated Goals  To decrease pain with  walking    Currently in Pain?  No/denies    Pain Onset  More than a month ago         TREATMENT   Therapeutic Exercise   Seated SLUMP glides - x25 with foot supported on stool Ambulation with focus on improving speed - requires sitting rest breaks after 3.75 min able to perform 876ft SL [place] deadlift -  2 x 10 B with 2x4 Straight leg hip extension and abduction -  2 x 10 with YTB Calf stretch in standing off of step - x20  holds with heel raises Monster walks with YTB around ankles - 2 x 10  Standing squats with airex under feet  Performed exercises to improve strength and hip/core stabilization     PT Education - 09/02/19 0912    Education Details  form/technique with exercise;    Person(s) Educated  Patient    Methods  Explanation;Demonstration    Comprehension  Verbalized understanding;Returned demonstration       PT Short Term Goals - 09/02/19 1254      PT SHORT TERM GOAL #1   Title  Patient will be able to walk for 15 min before requring to sit down during the session  Baseline  10 min; 09/02/2019: >1 hour    Time  2    Period  Weeks    Status  Achieved    Target Date  08/26/19        PT [place] Term Goals - 09/02/19 0915      PT [place] TERM GOAL #1   Title  Patient will have a worst pain of 4/10 to indicate a significant improvement in lumbar pain and spasms with walking    Baseline  8/10; 09/02/19: 0/10    Time  6    Period  Weeks    Status  New      PT [place] TERM GOAL #2   Title  Patient will improve her FOTO score from 60 to 65 to indicate significant improvement in walking and standing ability.    Baseline  60; 09/02/2019: 83    Time  6    Period  Weeks    Status  Achieved      PT [place] TERM GOAL #3   Title  Patient will be able to walk a mile without requiring to sit down to be better able to walk in the grocery store    Baseline  .25 mile (estimated); 09/02/2019: 1 hour of walking, ~1 mile    Time  6    Period  Weeks    Status  Achieved             Plan - 09/02/19 1244    Clinical Impression Statement  Patient has made significant improvement with [place] term goals with ability to walk over 1 mile, improvement in FOTO score to 83, and improvement with pain significantly. Given Patient an HEP to address at home, will follow up to in 2 weeks to review progress and discharge if no complications are evident. Patient will benefit from furhter skilled therapy to return to prior level of function.    Personal Factors and Comorbidities  Comorbidity 3+;Fitness;Profession    Comorbidities  Disabled, diabetes II, atopic dermatitis    Examination-Activity Limitations  Lift;Squat;Stairs;Stand;Bend    Examination-Participation Restrictions  Cleaning;Community Activity    Stability/Clinical Decision Making  Evolving/Moderate complexity    Rehab Potential  Fair    PT Frequency  2x / week    PT Duration  6 weeks    PT Treatment/Interventions  Electrical Stimulation;Iontophoresis 4mg /ml Dexamethasone;Biofeedback;Cryotherapy;Ultrasound;Moist Heat;Stair training;Gait training;Therapeutic activities;Therapeutic exercise;Balance training;Neuromuscular re-education;Manual techniques;Patient/family education;Passive range of motion;Dry needling;Joint Manipulations;Energy conservation    PT Next Visit Plan  hip strengthening manual therapy to improve lumbar mobility    PT Home Exercise Plan  see education section       Patient will benefit from skilled therapeutic intervention in order to improve the following deficits and impairments:  Abnormal gait, Impaired sensation, Pain, Decreased coordination, Decreased mobility, Increased muscle spasms, Postural dysfunction, Decreased activity tolerance, Decreased endurance, Decreased range of motion, Decreased strength, Difficulty walking, Decreased balance  Visit Diagnosis: Chronic bilateral low back pain with right-sided sciatica  Difficulty in walking, not elsewhere classified     Problem  List Patient Active Problem List   Diagnosis Date Noted  . Morbid obesity (HCC) 06/26/2018  . Atherosclerosis of aorta (HCC) 05/04/2017  . Pap smear abnormality of cervix with ASCUS favoring benign 04/23/2017  . Vitamin D deficiency 12/14/2016  . Obesity (BMI 30.0-34.9) 12/13/2015  . Chronic bronchitis (HCC) 05/14/2015  . DM type 2 with diabetic peripheral neuropathy (HCC) 05/14/2015  . Positive H. pylori test 04/05/2015  . Rapid urease test for Helicobacter pylori infection  positive 04/05/2015  . Umbilical hernia 67/67/2094  . Exomphalos 04/01/2015  . RLS (restless legs syndrome) 01/08/2015  . Type 2 diabetes mellitus with other diabetic kidney complication ([place]) 70/96/2836  . Neurogenic claudication 01/08/2015  . AD (atopic dermatitis) 12/28/2014  . Chronic LBP 12/28/2014  . Dyslipidemia 12/28/2014  . Gastroesophageal reflux disease without esophagitis 12/28/2014  . Microalbuminuria 12/28/2014  . Disorder of bone and cartilage 12/28/2014  . Neuralgia of left thigh 12/28/2014  . Perennial allergic rhinitis with seasonal variation 12/28/2014  . Tobacco abuse 12/28/2014  . Hypothyroidism 09/22/2005  . Hypertension, benign 09/22/2005    Blythe Stanford, PT DPT 09/02/2019, 12:56 PM  [place] [facility] [address] [place], [address] Phone: [phone]   Fax:  [phone]  Name: Julie Wall MRN: [id number] Date of Birth: 1955-11-09

## 2019-09-04 ENCOUNTER — Ambulatory Visit
Admission: RE | Admit: 2019-09-04 | Discharge: 2019-09-04 | Disposition: A | Discharge status: Home / Self Care | Payer: Medicare Other | Source: Ambulatory Visit | Attending: Neurology | Admitting: Neurology

## 2019-09-04 ENCOUNTER — Other Ambulatory Visit: Payer: Self-pay

## 2019-09-04 DIAGNOSIS — M546 Pain in thoracic spine: Secondary | ICD-10-CM | POA: Diagnosis not present

## 2019-09-04 DIAGNOSIS — R531 Weakness: Secondary | ICD-10-CM | POA: Diagnosis not present

## 2019-09-05 ENCOUNTER — Ambulatory Visit (INDEPENDENT_AMBULATORY_CARE_PROVIDER_SITE_OTHER): Payer: Medicare Other | Admitting: Family Medicine

## 2019-09-05 ENCOUNTER — Encounter: Payer: Self-pay | Admitting: Family Medicine

## 2019-09-05 VITALS — BP 114/68 | HR 92 | Temp 97.3°F | Resp 16 | Ht <= 58 in | Wt 178.1 lb

## 2019-09-05 DIAGNOSIS — M545 Low back pain: Secondary | ICD-10-CM | POA: Diagnosis not present

## 2019-09-05 DIAGNOSIS — J411 Mucopurulent chronic bronchitis: Secondary | ICD-10-CM

## 2019-09-05 DIAGNOSIS — I700 Atherosclerosis of aorta: Secondary | ICD-10-CM | POA: Diagnosis not present

## 2019-09-05 DIAGNOSIS — R809 Proteinuria, unspecified: Secondary | ICD-10-CM | POA: Diagnosis not present

## 2019-09-05 DIAGNOSIS — E1129 Type 2 diabetes mellitus with other diabetic kidney complication: Secondary | ICD-10-CM | POA: Diagnosis not present

## 2019-09-05 DIAGNOSIS — E538 Deficiency of other specified B group vitamins: Secondary | ICD-10-CM

## 2019-09-05 DIAGNOSIS — Z23 Encounter for immunization: Secondary | ICD-10-CM

## 2019-09-05 DIAGNOSIS — J410 Simple chronic bronchitis: Secondary | ICD-10-CM

## 2019-09-05 DIAGNOSIS — E559 Vitamin D deficiency, unspecified: Secondary | ICD-10-CM

## 2019-09-05 DIAGNOSIS — G8929 Other chronic pain: Secondary | ICD-10-CM

## 2019-09-05 DIAGNOSIS — G2581 Restless legs syndrome: Secondary | ICD-10-CM

## 2019-09-05 DIAGNOSIS — M48062 Spinal stenosis, lumbar region with neurogenic claudication: Secondary | ICD-10-CM

## 2019-09-05 DIAGNOSIS — Z8619 Personal history of other infectious and parasitic diseases: Secondary | ICD-10-CM | POA: Diagnosis not present

## 2019-09-05 DIAGNOSIS — L2089 Other atopic dermatitis: Secondary | ICD-10-CM

## 2019-09-05 DIAGNOSIS — G9519 Other vascular myelopathies: Secondary | ICD-10-CM

## 2019-09-05 LAB — POCT GLYCOSYLATED HEMOGLOBIN (HGB A1C): Hemoglobin A1C: 7.1 % — AB (ref 4.0–5.6)

## 2019-09-05 MED ORDER — ALBUTEROL SULFATE HFA 108 (90 BASE) MCG/ACT IN AERS: 2.0000 | INHALATION_SPRAY | Freq: Four times a day (QID) | RESPIRATORY_TRACT | 0 refills | Status: DC | PRN

## 2019-09-05 MED ORDER — ANORO ELLIPTA 62.5-25 MCG/INH IN AEPB: 1.0000 | INHALATION_SPRAY | Freq: Every day | RESPIRATORY_TRACT | 2 refills | Status: DC

## 2019-09-05 MED ORDER — CYANOCOBALAMIN 1000 MCG/ML IJ SOLN
1000.0000 ug | Freq: Once | INTRAMUSCULAR | Status: AC
  Administered 2019-09-05: 1000 ug via INTRAMUSCULAR

## 2019-09-05 MED ORDER — HYDROCODONE-ACETAMINOPHEN 5-325 MG PO TABS: 2.0000 | ORAL_TABLET | Freq: Two times a day (BID) | ORAL | 0 refills | Status: DC | PRN

## 2019-09-05 NOTE — Progress Notes (Signed)
Name: Julie Wall   MRN: [id number]    DOB: 1955-07-15   Date:09/08/2019       Progress Note  Subjective  Chief Complaint  Chief Complaint  Patient presents with  . Diabetes  . Hyperlipidemia  . COPD  . Hypertension    HPI  DMII with neuropathy and proteinuria: she has neurogenic claudication and history of proteinuria, but last urine micro was normal 10/2018.  Her hgbA1C 7.5% to 6.9% ,up to 7. 2 , 7.4% 7.0% 6.6,6.8%,7.1%down to 6.5%up to 7.2%,6.7%,7.3%,7.3% and today is 7.1%. She states she is no longer having sweet beverages however has been eating cookies lately. Denies polyphagia, polydipsia but she has noticed polyuria with Jardiance but stable, denies side effects of  on metforminShe is on gabapentin , also on  ARB25 mg for microalbuminuria, last urine micro was back to normal   Hyperlipidemia:she is backAtorvastatin and aspirin daily ,last LDL was at goal, HDL was low, and needs to increase intake of fish and tree nuts, also advised to walk more often .She had lung cancer screening 05/2017 and it showed aorta atherosclerosis.Denies myalgia   COPD: she quit smoking 12/03/2016, but resumed smokingFall 2019l,she has been smoking 3 cigarettes daily lately, advised her to quit again . She is taking Anoro and is helping her breath.Cough not present lately, she still has intermittent wheezing that is worse in am's and uses rescue inhaler when active   Back pain: she has back pain a few times a week, pain now1/10 at rest, but goes up to8/10 when walking, and takes hydrocodone prn, she states sometimes right leg gives out,and has radiculitis intermittent on left lower leg / thigh area  She denies leg weakness, no bowel or bladder incontinence, she has numbness on left lateral thigh. Discussed risk of addiction and importance of no diversion.She is doing okay with 30 pills of hydrocodone for 3 months. She has been seeing Julie Wall who ordered MRI and results  below, she was advised to see neurosurgeon but she is scared of injections and or surgery, gave her reassurance to at least see the sub-specialist   IMPRESSION/MRI thoracic spine 09/04/2019   Diffuse degenerative disc narrowing and bulging with small protrusions. Dominant findings at T11-12 where there is cord impingement with flattening and mild T2 hyperintensity by prior lumbar MRI.  IMPRESSION:08/03/2019  1. Moderate spinal stenosis L3-4 unchanged. Moderate subarticular stenosis bilaterally. 2. Severe spinal stenosis with progression since the prior study. Severe subarticular and foraminal stenosis on the left. 3. Moderate subarticular stenosis bilaterally L5-S1. 4. Progressive degenerative changes at T11-12 with significant spinal stenosis and mild cord signal abnormality. Thoracic MRI with dedicated axial images through this level may be helpful.  RLS: She sees neurologist and is on higher dose of Requip and per patient symptoms have improved   Atopic Dermatitis: doing much better on methotrexate continues to see [facility] dermatologist. Taking folic acid.Hair loss still present and is affecting her self -esteem, but seems to be slowing down now   Depression: she is feeling down lately, social isolation, upset about hair loss, appetite goes up and down, does not want medications or therapy at this time  Morbid Obesity: she has BMI above 35 and co-morbidities. Discussed life style modification, her weight is stable. She needs to avoid desserts   CIT or 6 during her wellness exam: discussed importance of memory games at home and we will monitor for now  Patient Active Problem List   Diagnosis Date Noted  . Morbid obesity ([place]) 06/26/2018  .  Atherosclerosis of aorta ([place]) 05/04/2017  . Pap smear abnormality of cervix with ASCUS favoring benign 04/23/2017  . Vitamin D deficiency 12/14/2016  . Obesity (BMI 30.0-34.9) 12/13/2015  . Chronic bronchitis ([place]) 05/14/2015  . DM type 2  with diabetic peripheral neuropathy ([place]) 05/14/2015  . Positive H. pylori test 04/05/2015  . Rapid urease test for Helicobacter pylori infection positive 04/05/2015  . Umbilical hernia 61/95/0932  . Exomphalos 04/01/2015  . RLS (restless legs syndrome) 01/08/2015  . Type 2 diabetes mellitus with other diabetic kidney complication ([place]) 67/05/4579  . Neurogenic claudication 01/08/2015  . AD (atopic dermatitis) 12/28/2014  . Chronic LBP 12/28/2014  . Dyslipidemia 12/28/2014  . Gastroesophageal reflux disease without esophagitis 12/28/2014  . Microalbuminuria 12/28/2014  . Disorder of bone and cartilage 12/28/2014  . Neuralgia of left thigh 12/28/2014  . Perennial allergic rhinitis with seasonal variation 12/28/2014  . Tobacco abuse 12/28/2014  . Hypothyroidism 09/22/2005  . Hypertension, benign 09/22/2005    Past Surgical History:  Procedure Laterality Date  . CATARACT EXTRACTION Bilateral 1980  . CESAREAN SECTION    . CORNEAL TRANSPLANT Left    [facility]-Treatment for blindness.    Family History  Problem Relation Age of Onset  . Diabetes Mother   . Hypertension Mother   . Pancreatic cancer Mother   . Alcohol abuse Father   . Arthritis/Rheumatoid Sister   . Diabetes Sister   . Kidney disease Sister   . Diabetes Brother   . Breast cancer Neg Hx     Social History   Tobacco Use  . Smoking status: Current Every Day Smoker    Packs/day: 0.50    Years: 46.00    Pack years: 23.00    Types: Cigarettes    Start date: 01/07/1985  . Smokeless tobacco: Never Used  . Tobacco comment: [facility] smoking cessation program information given  Substance Use Topics  . Alcohol use: No    Alcohol/week: 0.0 standard drinks     Current Outpatient Medications:  .  albuterol (VENTOLIN HFA) 108 (90 Base) MCG/ACT inhaler, Inhale 2 puffs into the lungs every 6 (six) hours as needed for wheezing or shortness of breath., Disp: 18 g, Rfl: 0 .  Alcohol Swabs (ALCOHOL PREP) PADS, , Disp: ,  Rfl:  .  aspirin 81 MG chewable tablet, Chew 1 tablet by mouth daily., Disp: , Rfl:  .  atorvastatin (LIPITOR) 40 MG tablet, Take 1 tablet (40 mg total) by mouth daily. In place of simvastatin, Disp: 90 tablet, Rfl: 1 .  Blood Glucose Monitoring Suppl (ACCU-CHEK AVIVA PLUS) w/Device KIT, , Disp: , Rfl:  .  clobetasol ointment (TEMOVATE) 0.05 %, Apply to thicker areas on body twice daily until clear, Disp: , Rfl:  .  empagliflozin (JARDIANCE) 25 MG TABS tablet, Take 25 mg by mouth daily., Disp: 90 tablet, Rfl: 1 .  ergocalciferol (VITAMIN D2) 1.25 MG (50000 UT) capsule, Take 1 capsule by mouth once a week., Disp: , Rfl:  .  fluocinonide ointment (LIDEX) 0.05 %, Apply topically., Disp: , Rfl:  .  folic acid (FOLVITE) 1 MG tablet, Take by mouth., Disp: , Rfl:  .  gabapentin (NEURONTIN) 300 MG capsule, Take 1 capsule (300 mg total) by mouth 3 (three) times daily., Disp: 270 capsule, Rfl: 0 .  glucose blood test strip, Check blood sugar once daily fasting, and up to 4 times daily PRN (Patient taking differently: Check blood sugar once daily fasting, and up to 4 times daily PRN ACCU CHEK smartview), Disp:  100 each, Rfl: 2 .  HYDROcodone-acetaminophen (NORCO/VICODIN) 5-325 MG tablet, Take 2 tablets by mouth 2 (two) times daily as needed for moderate pain., Disp: 30 tablet, Rfl: 0 .  Lancet Devices (SIMPLE DIAGNOSTICS LANCING DEV) MISC, Check blood sugar once daily fasting, and up to 4 times daily PRN., Disp: 100 each, Rfl: 2 .  losartan (COZAAR) 25 MG tablet, Take 1 tablet (25 mg total) by mouth daily. In place of lisinopril, Disp: 90 tablet, Rfl: 1 .  metFORMIN (GLUCOPHAGE-XR) 750 MG 24 hr tablet, Take 2 tablets (1,500 mg total) by mouth daily with breakfast., Disp: 180 tablet, Rfl: 1 .  methotrexate (RHEUMATREX) 2.5 MG tablet, Take 15 mg by mouth once a week. , Disp: , Rfl:  .  rOPINIRole (REQUIP XL) 2 MG 24 hr tablet, Take 1 tablet by mouth at bedtime., Disp: , Rfl:  .  triamcinolone ointment (KENALOG)  0.1 %, Apply topically. Apply to affected area twice daily as directed, Disp: , Rfl:  .  umeclidinium-vilanterol (ANORO ELLIPTA) 62.5-25 MCG/INH AEPB, Inhale 1 puff into the lungs daily., Disp: 180 each, Rfl: 2  Allergies  Allergen Reactions  . Penicillins Rash    I personally reviewed active problem list, medication list, allergies, family history, social history, health maintenance with the patient/caregiver today.   ROS  Constitutional: Negative for fever or weight change.  Respiratory: Negative for cough , positive for intermittent  shortness of breath.   Cardiovascular: Negative for chest pain or palpitations.  Gastrointestinal: Negative for abdominal pain, no bowel changes.  Musculoskeletal: Positive  for gait problem but no  joint swelling.  Skin: Negative for rash.  Neurological: Negative for dizziness or headache.  No other specific complaints in a complete review of systems (except as listed in HPI above).  Objective  Vitals:   09/05/19 1006  BP: 114/68  Pulse: 92  Resp: 16  Temp: (!) 97.3 F (36.3 C)  TempSrc: Temporal  SpO2: 94%  Weight: 178 lb 1.6 oz (80.8 kg)  Height: 4' 10"  (1.473 m)    Body mass index is 37.22 kg/m.  Physical Exam  Constitutional: Patient appears well-developed and well-nourished. Obese  No distress.  HEENT: head atraumatic, normocephalic, pupils equal and reactive to light Cardiovascular: Normal rate, regular rhythm and normal heart sounds.  No murmur heard. No BLE edema. Pulmonary/Chest: Effort normal and breath sounds normal. No respiratory distress. Abdominal: Soft.  There is no tenderness. Psychiatric: Patient has a normal mood and affect. behavior is normal. Judgment and thought content normal. Muscular skeletal: no pain during palpation of lumbar spine, negative straight leg raise   Recent Results (from the past 2160 hour(s))  POCT HgB A1C     Status: Abnormal   Collection Time: 09/05/19 10:05 AM  Result Value Ref Range    Hemoglobin A1C 7.1 (A) 4.0 - 5.6 %   HbA1c POC (<> result, manual entry)     HbA1c, POC (prediabetic range)     HbA1c, POC (controlled diabetic range)    COMPLETE METABOLIC PANEL WITH GFR     Status: Abnormal   Collection Time: 09/05/19 11:08 AM  Result Value Ref Range   Glucose, Bld 101 (H) 65 - 99 mg/dL    Comment: .            Fasting reference interval . For someone without known diabetes, a glucose value between 100 and 125 mg/dL is consistent with prediabetes and should be confirmed with a follow-up test. .    BUN 11 7 -  25 mg/dL   Creat 0.70 0.50 - 0.99 mg/dL    Comment: For patients >51 years of age, the reference limit for Creatinine is approximately 13% higher for people identified as African-American. .    GFR, Est Non African American 91 > OR = 60 mL/min/1.49m   GFR, Est African American 105 > OR = 60 mL/min/1.767m  BUN/Creatinine Ratio NOT APPLICABLE 6 - 22 (calc)   Sodium 142 135 - 146 mmol/L   Potassium 4.5 3.5 - 5.3 mmol/L   Chloride 108 98 - 110 mmol/L   CO2 27 20 - 32 mmol/L   Calcium 9.7 8.6 - 10.4 mg/dL   Total Protein 6.8 6.1 - 8.1 g/dL   Albumin 4.1 3.6 - 5.1 g/dL   Globulin 2.7 1.9 - 3.7 g/dL (calc)   AG Ratio 1.5 1.0 - 2.5 (calc)   Total Bilirubin 0.4 0.2 - 1.2 mg/dL   Alkaline phosphatase (APISO) 79 37 - 153 U/L   AST 14 10 - 35 U/L   ALT 11 6 - 29 U/L  Lipid panel     Status: Abnormal   Collection Time: 09/05/19 11:08 AM  Result Value Ref Range   Cholesterol 91 <200 mg/dL   HDL 45 (L) > OR = 50 mg/dL   Triglycerides 56 <150 mg/dL   LDL Cholesterol (Calc) 32 mg/dL (calc)    Comment: Reference range: <100 . Desirable range <100 mg/dL for primary prevention;   <70 mg/dL for patients with CHD or diabetic patients  with > or = 2 CHD risk factors. . Marland KitchenDL-C is now calculated using the Martin-Hopkins  calculation, which is a validated novel method providing  better accuracy than the Friedewald equation in the  estimation of LDL-C.  MaCresenciano Genreet al. JAAnnamaria [phone]  (http://education.[facility].com/faq/FAQ164)    Total CHOL/HDL Ratio 2.0 <5.0 (calc)   Non-HDL Cholesterol (Calc) 46 <130 mg/dL (calc)    Comment: For patients with diabetes plus 1 major ASCVD risk  factor, treating to a non-HDL-C goal of <100 mg/dL  (LDL-C of <70 mg/dL) is considered a therapeutic  option.   Microalbumin / creatinine urine ratio     Status: None   Collection Time: 09/05/19 11:08 AM  Result Value Ref Range   Creatinine, Urine 56 20 - 275 mg/dL   Microalb, Ur 0.2 mg/dL    Comment: Reference Range Not established    Microalb Creat Ratio 4 <30 mcg/mg creat    Comment: . The ADA defines abnormalities in albumin excretion as follows: . Marland Kitchenategory         Result (mcg/mg creatinine) . Normal                    <30 Microalbuminuria         30-299  Clinical albuminuria   > OR = 300 . The ADA recommends that at least two of three specimens collected within a 3-6 month period be abnormal before considering a patient to be within a diagnostic category.     PHQ2/9: Depression screen [facility] 09/05/2019 06/12/2019 05/07/2019 04/17/2019 01/21/2019  Decreased Interest 0 0 0 0 0  Down, Depressed, Hopeless 0 0 1 0 0  PHQ - 2 Score 0 0 1 0 0  Altered sleeping 0 - 0 0 0  Tired, decreased energy 0 - 1 0 0  Change in appetite 0 - 1 0 0  Feeling bad or failure about yourself  0 - 3 0 0  Trouble concentrating 0 -  0 0 0  Moving slowly or fidgety/restless 0 - 0 0 0  Suicidal thoughts 0 - 0 0 0  PHQ-9 Score 0 - 6 0 0  Difficult doing work/chores - - Somewhat difficult Not difficult at all -  Some recent data might be hidden    phq 9 is negative   Fall Risk: Fall Risk  09/05/2019 06/12/2019 05/07/2019 04/17/2019 01/21/2019  Falls in the past year? 0 1 1 0 0  Number falls in past yr: 0 1 0 0 0  Injury with Fall? 0 0 0 0 0  Comment - - - - -  Risk Factor Category  - - - - -  Risk for fall due to : - History of fall(s) History of fall(s) -  -  Risk for fall due to: Comment - - - - -  Follow up - Falls prevention discussed Education provided Falls evaluation completed -     Functional Status Survey: Is the patient deaf or have difficulty hearing?: No Does the patient have difficulty seeing, even when wearing glasses/contacts?: No Does the patient have difficulty concentrating, remembering, or making decisions?: No Does the patient have difficulty walking or climbing stairs?: No Does the patient have difficulty dressing or bathing?: No Does the patient have difficulty doing errands alone such as visiting a doctor's office or shopping?: No    Assessment & Plan  1. Type 2 diabetes mellitus with other diabetic kidney complication (HCC)  - POCT HgB A1C  2. Simple chronic bronchitis (HCC)  - umeclidinium-vilanterol (ANORO ELLIPTA) 62.5-25 MCG/INH AEPB; Inhale 1 puff into the lungs daily.  Dispense: 180 each; Refill: 2 - albuterol (VENTOLIN HFA) 108 (90 Base) MCG/ACT inhaler; Inhale 2 puffs into the lungs every 6 (six) hours as needed for wheezing or shortness of breath.  Dispense: 18 g; Refill: 0  3. Neurogenic claudication  - HYDROcodone-acetaminophen (NORCO/VICODIN) 5-325 MG tablet; Take 2 tablets by mouth 2 (two) times daily as needed for moderate pain.  Dispense: 30 tablet; Refill: 0  4. Chronic bilateral low back pain without sciatica  - HYDROcodone-acetaminophen (NORCO/VICODIN) 5-325 MG tablet; Take 2 tablets by mouth 2 (two) times daily as needed for moderate pain.  Dispense: 30 tablet; Refill: 0  5. Need for vaccination for pneumococcus  - Pneumococcal polysaccharide vaccine 23-valent greater than or equal to 2yo subcutaneous/IM  6. B12 deficiency  - cyanocobalamin ((VITAMIN B-12)) injection 1,000 mcg  7. Vitamin D deficiency   8. RLS (restless legs syndrome)  We will replaced B12  9. Morbid obesity ([place])  Discussed with the patient the risk posed by an increased BMI. Discussed importance of portion  control, calorie counting and at least 150 minutes of physical activity weekly. Avoid sweet beverages and drink more water. Eat at least 6 servings of fruit and vegetables daily   10. Atherosclerosis of aorta (HCC)  - Lipid panel  11. Diabetes mellitus with microalbuminuria (HCC)  - COMPLETE METABOLIC PANEL WITH GFR - Microalbumin / creatinine urine ratio

## 2019-09-06 LAB — COMPLETE METABOLIC PANEL WITH GFR
AG Ratio: 1.5 (calc) (ref 1.0–2.5)
ALT: 11 U/L (ref 6–29)
AST: 14 U/L (ref 10–35)
Albumin: 4.1 g/dL (ref 3.6–5.1)
Alkaline phosphatase (APISO): 79 U/L (ref 37–153)
BUN: 11 mg/dL (ref 7–25)
CO2: 27 mmol/L (ref 20–32)
Calcium: 9.7 mg/dL (ref 8.6–10.4)
Chloride: 108 mmol/L (ref 98–110)
Creat: 0.7 mg/dL (ref 0.50–0.99)
GFR, Est African American: 105 mL/min/{1.73_m2} (ref >=60)
GFR, Est Non African American: 91 mL/min/{1.73_m2} (ref >=60)
Globulin: 2.7 g/dL (calc) (ref 1.9–3.7)
Glucose, Bld: 101 mg/dL — ABNORMAL HIGH (ref 65–99)
Potassium: 4.5 mmol/L (ref 3.5–5.3)
Sodium: 142 mmol/L (ref 135–146)
Total Bilirubin: 0.4 mg/dL (ref 0.2–1.2)
Total Protein: 6.8 g/dL (ref 6.1–8.1)

## 2019-09-06 LAB — LIPID PANEL
Cholesterol: 91 mg/dL (ref <200)
HDL: 45 mg/dL — ABNORMAL LOW (ref >=50)
LDL Cholesterol (Calc): 32 mg/dL (calc)
Non-HDL Cholesterol (Calc): 46 mg/dL (calc) (ref <130)
Total CHOL/HDL Ratio: 2 (calc) (ref <5.0)
Triglycerides: 56 mg/dL (ref <150)

## 2019-09-06 LAB — MICROALBUMIN / CREATININE URINE RATIO
Creatinine, Urine: 56 mg/dL (ref 20–275)
Microalb Creat Ratio: 4 mcg/mg creat (ref <30)
Microalb, Ur: 0.2 mg/dL

## 2019-09-17 ENCOUNTER — Ambulatory Visit
Admission: RE | Admit: 2019-09-17 | Discharge: 2019-09-17 | Disposition: A | Discharge status: Home / Self Care | Payer: Medicare Other | Source: Ambulatory Visit | Attending: Family Medicine | Admitting: Family Medicine

## 2019-09-17 DIAGNOSIS — Z1231 Encounter for screening mammogram for malignant neoplasm of breast: Secondary | ICD-10-CM | POA: Diagnosis not present

## 2019-09-25 DIAGNOSIS — M48062 Spinal stenosis, lumbar region with neurogenic claudication: Secondary | ICD-10-CM | POA: Diagnosis not present

## 2019-09-25 DIAGNOSIS — M4714 Other spondylosis with myelopathy, thoracic region: Secondary | ICD-10-CM | POA: Diagnosis not present

## 2019-09-30 ENCOUNTER — Telehealth: Payer: Self-pay | Admitting: Family Medicine

## 2019-09-30 NOTE — Chronic Care Management (AMB) (Signed)
Chronic Care Management   Note  09/30/2019 Name: Julie Wall MRN: [id number] DOB: June 15, 1955  Julie Wall is a 64 y.o. year old female who is a primary care patient of Steele Sizer, MD. I reached out to [employer] by phone today in response to a referral sent by Ms. Tarini Naumann's health plan.     Ms. Renbarger was given information about Chronic Care Management services today including:  1. CCM service includes personalized support from designated clinical staff supervised by her physician, including individualized plan of care and coordination with other care providers 2. 24/7 contact phone numbers for assistance for urgent and routine care needs. 3. Service will only be billed when office clinical staff spend 20 minutes or more in a month to coordinate care. 4. Only one practitioner may furnish and bill the service in a calendar month. 5. The patient may stop CCM services at any time (effective at the end of the month) by phone call to the office staff. 6. The patient will be responsible for cost sharing (co-pay) of up to 20% of the service fee (after annual deductible is met).  Patient agreed to services and verbal consent obtained.   Follow up plan: Telephone appointment with care management team member scheduled for:10/08/2019  Noreene Larsson, [address], [place], [place] [address] Direct Dial: [phone] [email or web address] Website: [email or web address]

## 2019-10-06 ENCOUNTER — Encounter: Payer: Self-pay | Admitting: Family Medicine

## 2019-10-06 ENCOUNTER — Ambulatory Visit (INDEPENDENT_AMBULATORY_CARE_PROVIDER_SITE_OTHER): Payer: Medicare Other | Admitting: Family Medicine

## 2019-10-06 ENCOUNTER — Other Ambulatory Visit: Payer: Self-pay

## 2019-10-06 VITALS — BP 100/50 | HR 105 | Temp 97.3°F | Resp 16 | Ht <= 58 in | Wt 168.2 lb

## 2019-10-06 DIAGNOSIS — I952 Hypotension due to drugs: Secondary | ICD-10-CM

## 2019-10-06 DIAGNOSIS — E1129 Type 2 diabetes mellitus with other diabetic kidney complication: Secondary | ICD-10-CM | POA: Diagnosis not present

## 2019-10-06 DIAGNOSIS — M62830 Muscle spasm of back: Secondary | ICD-10-CM

## 2019-10-06 DIAGNOSIS — R809 Proteinuria, unspecified: Secondary | ICD-10-CM | POA: Diagnosis not present

## 2019-10-06 MED ORDER — LOSARTAN POTASSIUM 25 MG PO TABS: 12.5000 mg | ORAL_TABLET | Freq: Every day | ORAL | 0 refills | Status: DC

## 2019-10-06 MED ORDER — BACLOFEN 20 MG PO TABS: 20.0000 mg | ORAL_TABLET | Freq: Three times a day (TID) | ORAL | 0 refills | Status: DC

## 2019-10-06 MED ORDER — MELOXICAM 15 MG PO TABS: 15.0000 mg | ORAL_TABLET | Freq: Every day | ORAL | 0 refills | Status: DC

## 2019-10-06 NOTE — Progress Notes (Signed)
Name: Julie Wall   MRN: [id number]    DOB: 1955-08-15   Date:10/06/2019       Progress Note  Subjective  Chief Complaint  Chief Complaint  Patient presents with  . Back Pain    She complains of back pain that started on Friday. She said she got up to answer the phone, sat down and when she got up she had pain in her left lower back. Pain is tender to touch, sharp, constant and pressure. She denies injury.     HPI  Acute left lower back pain: acute onset on Friday, she reached to get her phone with her right hand and when she sat back down the pain was sharp and intense. She states any movement of spine causes worsening of pain, difficulty walking because it is hard to stretch her back.  No radiculitis from this episode. No bowel or bladder incontinence   Patient Active Problem List   Diagnosis Date Noted  . Morbid obesity ([place]) 06/26/2018  . Atherosclerosis of aorta ([place]) 05/04/2017  . Pap smear abnormality of cervix with ASCUS favoring benign 04/23/2017  . Vitamin D deficiency 12/14/2016  . Obesity (BMI 30.0-34.9) 12/13/2015  . Chronic bronchitis ([place]) 05/14/2015  . DM type 2 with diabetic peripheral neuropathy ([place]) 05/14/2015  . Positive H. pylori test 04/05/2015  . Rapid urease test for Helicobacter pylori infection positive 04/05/2015  . Umbilical hernia 26/83/4196  . Exomphalos 04/01/2015  . RLS (restless legs syndrome) 01/08/2015  . Type 2 diabetes mellitus with other diabetic kidney complication ([place]) 22/29/7989  . Neurogenic claudication 01/08/2015  . AD (atopic dermatitis) 12/28/2014  . Chronic LBP 12/28/2014  . Dyslipidemia 12/28/2014  . Gastroesophageal reflux disease without esophagitis 12/28/2014  . Microalbuminuria 12/28/2014  . Disorder of bone and cartilage 12/28/2014  . Neuralgia of left thigh 12/28/2014  . Perennial allergic rhinitis with seasonal variation 12/28/2014  . Tobacco abuse 12/28/2014  . Hypothyroidism 09/22/2005  . Hypertension, benign  09/22/2005    Past Surgical History:  Procedure Laterality Date  . CATARACT EXTRACTION Bilateral 1980  . CESAREAN SECTION    . CORNEAL TRANSPLANT Left    [facility]-Treatment for blindness.    Family History  Problem Relation Age of Onset  . Diabetes Mother   . Hypertension Mother   . Pancreatic cancer Mother   . Alcohol abuse Father   . Arthritis/Rheumatoid Sister   . Diabetes Sister   . Kidney disease Sister   . Diabetes Brother   . Breast cancer Neg Hx     Social History   Tobacco Use  . Smoking status: Current Every Day Smoker    Packs/day: 0.50    Years: 46.00    Pack years: 23.00    Types: Cigarettes    Start date: 01/07/1985  . Smokeless tobacco: Never Used  . Tobacco comment: [place] smoking cessation program information given  Substance Use Topics  . Alcohol use: No    Alcohol/week: 0.0 standard drinks     Current Outpatient Medications:  .  albuterol (VENTOLIN HFA) 108 (90 Base) MCG/ACT inhaler, Inhale 2 puffs into the lungs every 6 (six) hours as needed for wheezing or shortness of breath., Disp: 18 g, Rfl: 0 .  Alcohol Swabs (ALCOHOL PREP) PADS, , Disp: , Rfl:  .  aspirin 81 MG chewable tablet, Chew 1 tablet by mouth daily., Disp: , Rfl:  .  atorvastatin (LIPITOR) 40 MG tablet, Take 1 tablet (40 mg total) by mouth daily. In place of simvastatin, Disp:  90 tablet, Rfl: 1 .  Blood Glucose Monitoring Suppl (ACCU-CHEK AVIVA PLUS) w/Device KIT, , Disp: , Rfl:  .  clobetasol ointment (TEMOVATE) 0.05 %, Apply to thicker areas on body twice daily until clear, Disp: , Rfl:  .  empagliflozin (JARDIANCE) 25 MG TABS tablet, Take 25 mg by mouth daily., Disp: 90 tablet, Rfl: 1 .  ergocalciferol (VITAMIN D2) 1.25 MG (50000 UT) capsule, Take 1 capsule by mouth once a week., Disp: , Rfl:  .  fluocinonide ointment (LIDEX) 0.05 %, Apply topically., Disp: , Rfl:  .  folic acid (FOLVITE) 1 MG tablet, Take by mouth., Disp: , Rfl:  .  gabapentin (NEURONTIN) 300 MG capsule, Take 1  capsule (300 mg total) by mouth 3 (three) times daily., Disp: 270 capsule, Rfl: 0 .  glucose blood test strip, Check blood sugar once daily fasting, and up to 4 times daily PRN (Patient taking differently: Check blood sugar once daily fasting, and up to 4 times daily PRN ACCU CHEK smartview), Disp: 100 each, Rfl: 2 .  HYDROcodone-acetaminophen (NORCO/VICODIN) 5-325 MG tablet, Take 2 tablets by mouth 2 (two) times daily as needed for moderate pain., Disp: 30 tablet, Rfl: 0 .  Lancet Devices (SIMPLE DIAGNOSTICS LANCING DEV) MISC, Check blood sugar once daily fasting, and up to 4 times daily PRN., Disp: 100 each, Rfl: 2 .  losartan (COZAAR) 25 MG tablet, Take 1 tablet (25 mg total) by mouth daily. In place of lisinopril, Disp: 90 tablet, Rfl: 1 .  metFORMIN (GLUCOPHAGE-XR) 750 MG 24 hr tablet, Take 2 tablets (1,500 mg total) by mouth daily with breakfast., Disp: 180 tablet, Rfl: 1 .  methotrexate (RHEUMATREX) 2.5 MG tablet, Take 15 mg by mouth once a week. , Disp: , Rfl:  .  triamcinolone ointment (KENALOG) 0.1 %, Apply topically. Apply to affected area twice daily as directed, Disp: , Rfl:  .  umeclidinium-vilanterol (ANORO ELLIPTA) 62.5-25 MCG/INH AEPB, Inhale 1 puff into the lungs daily., Disp: 180 each, Rfl: 2 .  rOPINIRole (REQUIP XL) 2 MG 24 hr tablet, Take 1 tablet by mouth at bedtime., Disp: , Rfl:   Allergies  Allergen Reactions  . Penicillins Rash    I personally reviewed active problem list, medication list, allergies, family history, social history with the patient/caregiver today.   ROS  Constitutional: Negative for fever or weight change.  Respiratory: Negative for cough and shortness of breath.   Cardiovascular: Negative for chest pain or palpitations.  Gastrointestinal: Negative for abdominal pain, no bowel changes.  Musculoskeletal: Negative for gait problem or joint swelling.  Skin: Negative for rash.  Neurological: Negative for dizziness or headache.  No other specific  complaints in a complete review of systems (except as listed in HPI above).  Objective  Vitals:   10/06/19 1112  BP: (!) 100/50  Pulse: (!) 105  Resp: 16  Temp: (!) 97.3 F (36.3 C)  TempSrc: Temporal  SpO2: 93%  Weight: 168 lb 3.2 oz (76.3 kg)  Height: 4' 10"  (1.473 m)    Body mass index is 35.15 kg/m.  Physical Exam  Constitutional: Patient appears well-developed and well-nourished. Obese  No distress.  HEENT: head atraumatic, normocephalic, pupils equal and reactive to light Cardiovascular: Normal rate, regular rhythm and normal heart sounds.  No murmur heard. No BLE edema. Pulmonary/Chest: Effort normal and breath sounds normal. No respiratory distress. Abdominal: Soft.  There is no tenderness. Muscular skeletal: antalgic gait, leaning to the left side, spine is not straight, pain and spasms on right  lower back  Psychiatric: Patient has a normal mood and affect. behavior is normal. Judgment and thought content normal.  Recent Results (from the past 2160 hour(s))  POCT HgB A1C     Status: Abnormal   Collection Time: 09/05/19 10:05 AM  Result Value Ref Range   Hemoglobin A1C 7.1 (A) 4.0 - 5.6 %   HbA1c POC (<> result, manual entry)     HbA1c, POC (prediabetic range)     HbA1c, POC (controlled diabetic range)    COMPLETE METABOLIC PANEL WITH GFR     Status: Abnormal   Collection Time: 09/05/19 11:08 AM  Result Value Ref Range   Glucose, Bld 101 (H) 65 - 99 mg/dL    Comment: .            Fasting reference interval . For someone without known diabetes, a glucose value between 100 and 125 mg/dL is consistent with prediabetes and should be confirmed with a follow-up test. .    BUN 11 7 - 25 mg/dL   Creat 0.70 0.50 - 0.99 mg/dL    Comment: For patients >6 years of age, the reference limit for Creatinine is approximately 13% higher for people identified as African-American. .    GFR, Est Non African American 91 > OR = 60 mL/min/1.91m   GFR, Est African American  105 > OR = 60 mL/min/1.759m  BUN/Creatinine Ratio NOT APPLICABLE 6 - 22 (calc)   Sodium 142 135 - 146 mmol/L   Potassium 4.5 3.5 - 5.3 mmol/L   Chloride 108 98 - 110 mmol/L   CO2 27 20 - 32 mmol/L   Calcium 9.7 8.6 - 10.4 mg/dL   Total Protein 6.8 6.1 - 8.1 g/dL   Albumin 4.1 3.6 - 5.1 g/dL   Globulin 2.7 1.9 - 3.7 g/dL (calc)   AG Ratio 1.5 1.0 - 2.5 (calc)   Total Bilirubin 0.4 0.2 - 1.2 mg/dL   Alkaline phosphatase (APISO) 79 37 - 153 U/L   AST 14 10 - 35 U/L   ALT 11 6 - 29 U/L  Lipid panel     Status: Abnormal   Collection Time: 09/05/19 11:08 AM  Result Value Ref Range   Cholesterol 91 <200 mg/dL   HDL 45 (L) > OR = 50 mg/dL   Triglycerides 56 <150 mg/dL   LDL Cholesterol (Calc) 32 mg/dL (calc)    Comment: Reference range: <100 . Desirable range <100 mg/dL for primary prevention;   <70 mg/dL for patients with CHD or diabetic patients  with > or = 2 CHD risk factors. . Marland KitchenDL-C is now calculated using the Martin-Hopkins  calculation, which is a validated novel method providing  better accuracy than the Friedewald equation in the  estimation of LDL-C.  MaCresenciano Genret al. JAAnnamaria Helling201610;960(45 [id number]  (http://education.[facility].com/faq/FAQ164)    Total CHOL/HDL Ratio 2.0 <5.0 (calc)   Non-HDL Cholesterol (Calc) 46 <130 mg/dL (calc)    Comment: For patients with diabetes plus 1 major ASCVD risk  factor, treating to a non-HDL-C goal of <100 mg/dL  (LDL-C of <70 mg/dL) is considered a therapeutic  option.   Microalbumin / creatinine urine ratio     Status: None   Collection Time: 09/05/19 11:08 AM  Result Value Ref Range   Creatinine, Urine 56 20 - 275 mg/dL   Microalb, Ur 0.2 mg/dL    Comment: Reference Range Not established    Microalb Creat Ratio 4 <30 mcg/mg creat    Comment: . The ADA defines abnormalities  in albumin excretion as follows: Marland Kitchen Category         Result (mcg/mg creatinine) . Normal                    <30 Microalbuminuria         30-299   Clinical albuminuria   > OR = 300 . The ADA recommends that at least two of three specimens collected within a 3-6 month period be abnormal before considering a patient to be within a diagnostic category.      PHQ2/9: Depression screen [facility] 2/9 10/06/2019 09/05/2019 06/12/2019 05/07/2019 04/17/2019  Decreased Interest 0 0 0 0 0  Down, Depressed, Hopeless 0 0 0 1 0  PHQ - 2 Score 0 0 0 1 0  Altered sleeping 0 0 - 0 0  Tired, decreased energy 0 0 - 1 0  Change in appetite 0 0 - 1 0  Feeling bad or failure about yourself  0 0 - 3 0  Trouble concentrating 0 0 - 0 0  Moving slowly or fidgety/restless 0 0 - 0 0  Suicidal thoughts 0 0 - 0 0  PHQ-9 Score 0 0 - 6 0  Difficult doing work/chores - - - Somewhat difficult Not difficult at all  Some recent data might be hidden    phq 9 is negative  Fall Risk: Fall Risk  10/06/2019 09/05/2019 06/12/2019 05/07/2019 04/17/2019  Falls in the past year? 0 0 1 1 0  Number falls in past yr: 0 0 1 0 0  Injury with Fall? 0 0 0 0 0  Comment - - - - -  Risk Factor Category  - - - - -  Risk for fall due to : - - History of fall(s) History of fall(s) -  Risk for fall due to: Comment - - - - -  Follow up - - Falls prevention discussed Education provided Falls evaluation completed    Functional Status Survey: Is the patient deaf or have difficulty hearing?: No Does the patient have difficulty seeing, even when wearing glasses/contacts?: No Does the patient have difficulty concentrating, remembering, or making decisions?: No Does the patient have difficulty walking or climbing stairs?: No Does the patient have difficulty dressing or bathing?: No Does the patient have difficulty doing errands alone such as visiting a doctor's office or shopping?: No    Assessment & Plan  1. Spasm of muscle of lower back  - baclofen (LIORESAL) 20 MG tablet; Take 1 tablet (20 mg total) by mouth 3 (three) times daily.  Dispense: 60 each; Refill: 0 - meloxicam (MOBIC) 15 MG  tablet; Take 1 tablet (15 mg total) by mouth daily.  Dispense: 30 tablet; Refill: 0  2. Microalbuminuria  - losartan (COZAAR) 25 MG tablet; Take 0.5 tablets (12.5 mg total) by mouth daily. In place of lisinopril  Dispense: 45 tablet; Refill: 0  3. Diabetes mellitus with microalbuminuria (HCC)  - losartan (COZAAR) 25 MG tablet; Take 0.5 tablets (12.5 mg total) by mouth daily. In place of lisinopril  Dispense: 45 tablet; Refill: 0  4. Hypotension due to drugs  bp is low, we will cut dose in half since she needs it for microalbuminuria  - losartan (COZAAR) 25 MG tablet; Take 0.5 tablets (12.5 mg total) by mouth daily. In place of lisinopril  Dispense: 45 tablet; Refill: 0

## 2019-10-08 ENCOUNTER — Other Ambulatory Visit: Payer: Self-pay

## 2019-10-08 ENCOUNTER — Ambulatory Visit: Payer: Medicare Other | Admitting: Pharmacist

## 2019-10-08 DIAGNOSIS — Z720 Tobacco use: Secondary | ICD-10-CM

## 2019-10-08 DIAGNOSIS — E1129 Type 2 diabetes mellitus with other diabetic kidney complication: Secondary | ICD-10-CM

## 2019-10-08 NOTE — Patient Instructions (Addendum)
Visit Information  Goals Addressed            This Visit's Progress   . COPD - goal reduce rescue inhaler use        CARE PLAN ENTRY (see longitudinal plan of care for additional care plan information)  Current Barriers:  . Complex patient with multiple comorbidities including tobacco use, Diabetes, back pain . Self-manages medications by bottles Does not use a pill box or other adherence strategies   Pharmacist Clinical Goal(s):  Marland Kitchen Over the next 90 days, patient will work with PharmD and provider towards reduced rescue inhaler use to less than 1 time daily  Interventions: . Comprehensive medication review performed; medication list updated in electronic medical record . Inter-disciplinary care team collaboration (see longitudinal plan of care) . Smoking reduction and awareness of rebound reactive airway from abluterol  Patient Self Care Activities:  . Patient will take medications as prescribed . Patient will focus on improved adherence by using rescue inhaler only to avoid exacerbation . Reduce Ventolin use to 3 times weekly over the next 90 days   Initial goal documentation     . Diabetes Mellitus - goa A1c < 7%       CARE PLAN ENTRY (see longitudinal plan of care for additional care plan information)  Current Barriers:  . Diabetes: type 2; complicated by chronic medical conditions including Tobacco use, Chronic pain Lab Results  Component Value Date   HGBA1C 7.1 (A) 09/05/2019 .   Lab Results  Component Value Date   CREATININE 0.70 09/05/2019   CREATININE 0.70 10/21/2018   CREATININE 0.62 03/05/2018 .   Marland Kitchen No results found for: EGFR . Current antihyperglycemic regimen: metformin, Jardiance . Denies hypoglycemic symptoms, including dizziness, lightheadedness, shaking, sweating . Denies hyperglycemic symptoms, including polyuria, polydipsia, polyphagia, nocturia, blurred vision, neuropathy . Current meal patterns: o Snacks: eats cookies between meals . Current  exercise: PT . Current blood glucose readings: doesn't check  Pharmacist Clinical Goal(s):  Marland Kitchen Over the next 90 days, patient will work with PharmD and primary care provider to address unnecessary snacking  Interventions: . Comprehensive medication review performed, medication list updated in electronic medical record . Inter-disciplinary care team collaboration (see longitudinal plan of care) . Discussed role of diet in blood glucose control  Patient Self Care Activities:  . Patient to reduce snacking between meals, especially high carbohydrate snacks . Patient to replace physical therapy with home exercise over the next 90 days  Initial goal documentation     . Tobacco abuse - goal taper to cessation       CARE PLAN ENTRY (see longitudinal plan of care for additional care plan information)  Current Barriers:  . Tobacco abuse of 46 years; currently smoking 0.5 ppd . Previous quit attempts, unsuccessful . Denies smoking within 30 minutes of waking up . Reports triggers to smoke include: family stress . Reports motivation to quit smoking includes: she is strong willed and confident . On a scale of 1-10, reports MOTIVATION to quit is 7 . On a scale of 1-10, reports CONFIDENCE in quitting is 10  Pharmacist Clinical Goal(s):  Marland Kitchen Over the next 30 days, patient will work with PharmD and provider towards tobacco cessation   Interventions: . Comprehensive medication review performed, medication list in electronic medical record updated . Inter-disciplinary care team collaboration (see longitudinal plan of care) . Reduce smoking by 1 cigarette at a time   Patient Self Care Activities:  . Patient will twiddle pen as a habit  during cravings  . Patient will commit to reducing tobacco consumption 1 per day each week  Initial goal documentation        Julie Wall was given information about Chronic Care Management services today including:  1. CCM service includes personalized support  from designated clinical staff supervised by her physician, including individualized plan of care and coordination with other care providers 2. 24/7 contact phone numbers for assistance for urgent and routine care needs. 3. Standard insurance, coinsurance, copays and deductibles apply for chronic care management only during months in which we provide at least 20 minutes of these services. Most insurances cover these services at 100%, however patients may be responsible for any copay, coinsurance and/or deductible if applicable. This service may help you avoid the need for more expensive face-to-face services. 4. Only one practitioner may furnish and bill the service in a calendar month. 5. The patient may stop CCM services at any time (effective at the end of the month) by phone call to the office staff.  Patient agreed to services and verbal consent obtained.   Print copy of patient instructions provided.  Telephone follow up appointment with pharmacy team member scheduled for: 1 month  Milus Height, PharmD, [place], [facility] [phone] Steps to Quit Smoking Smoking tobacco is the leading cause of preventable death. It can affect almost every organ in the body. Smoking puts you and people around you at risk for many serious, [place]-lasting (chronic) diseases. Quitting smoking can be hard, but it is one of the best things that you can do for your health. It is never too late to quit. How do I get ready to quit? When you decide to quit smoking, make a plan to help you succeed. Before you quit:  Pick a date to quit. Set a date within the next 2 weeks to give you time to prepare.  Write down the reasons why you are quitting. Keep this list in places where you will see it often.  Tell your family, friends, and co-workers that you are quitting. Their support is important.  Talk with your doctor about the choices that may help you quit.  Find out if your  health insurance will pay for these treatments.  Know the people, places, things, and activities that make you want to smoke (triggers). Avoid them. What first steps can I take to quit smoking?  Throw away all cigarettes at home, at work, and in your car.  Throw away the things that you use when you smoke, such as ashtrays and lighters.  Clean your car. Make sure to empty the ashtray.  Clean your home, including curtains and carpets. What can I do to help me quit smoking? Talk with your doctor about taking medicines and seeing a counselor at the same time. You are more likely to succeed when you do both.  If you are pregnant or breastfeeding, talk with your doctor about counseling or other ways to quit smoking. Do not take medicine to help you quit smoking unless your doctor tells you to do so. To quit smoking: Quit right away  Quit smoking totally, instead of slowly cutting back on how much you smoke over a period of time.  Go to counseling. You are more likely to quit if you go to counseling sessions regularly. Take medicine You may take medicines to help you quit. Some medicines need a prescription, and some you can buy over-the-counter. Some medicines may contain a drug called nicotine  to replace the nicotine in cigarettes. Medicines may:  Help you to stop having the desire to smoke (cravings).  Help to stop the problems that come when you stop smoking (withdrawal symptoms). Your doctor may ask you to use:  Nicotine patches, gum, or lozenges.  Nicotine inhalers or sprays.  Non-nicotine medicine that is taken by mouth. Find resources Find resources and other ways to help you quit smoking and remain smoke-free after you quit. These resources are most helpful when you use them often. They include:  Online chats with a Social worker.  Phone quitlines.  Printed Furniture conservator/restorer.  Support groups or group counseling.  Text messaging programs.  Mobile phone apps. Use apps  on your mobile phone or tablet that can help you stick to your quit plan. There are many free apps for mobile phones and tablets as well as websites. Examples include Quit Guide from the [employer] and smokefree.gov  What things can I do to make it easier to quit?   Talk to your family and friends. Ask them to support and encourage you.  Call a phone quitline (1-800-QUIT-NOW), reach out to support groups, or work with a Social worker.  Ask people who smoke to not smoke around you.  Avoid places that make you want to smoke, such as: ? Bars. ? Parties. ? Smoke-break areas at work.  Spend time with people who do not smoke.  Lower the stress in your life. Stress can make you want to smoke. Try these things to help your stress: ? Getting regular exercise. ? Doing deep-breathing exercises. ? Doing yoga. ? Meditating. ? Doing a body scan. To do this, close your eyes, focus on one area of your body at a time from head to toe. Notice which parts of your body are tense. Try to relax the muscles in those areas. How will I feel when I quit smoking? Day 1 to 3 weeks Within the first 24 hours, you may start to have some problems that come from quitting tobacco. These problems are very bad 2-3 days after you quit, but they do not often last for more than 2-3 weeks. You may get these symptoms:  Mood swings.  Feeling restless, nervous, angry, or annoyed.  Trouble concentrating.  Dizziness.  Strong desire for high-sugar foods and nicotine.  Weight gain.  Trouble pooping (constipation).  Feeling like you may vomit (nausea).  Coughing or a sore throat.  Changes in how the medicines that you take for other issues work in your body.  Depression.  Trouble sleeping (insomnia). Week 3 and afterward After the first 2-3 weeks of quitting, you may start to notice more positive results, such as:  Better sense of smell and taste.  Less coughing and sore throat.  Slower heart rate.  Lower blood  pressure.  Clearer skin.  Better breathing.  Fewer sick days. Quitting smoking can be hard. Do not give up if you fail the first time. Some people need to try a few times before they succeed. Do your best to stick to your quit plan, and talk with your doctor if you have any questions or concerns. Summary  Smoking tobacco is the leading cause of preventable death. Quitting smoking can be hard, but it is one of the best things that you can do for your health.  When you decide to quit smoking, make a plan to help you succeed.  Quit smoking right away, not slowly over a period of time.  When you start quitting, seek help from  your doctor, family, or friends. This information is not intended to replace advice given to you by your health care provider. Make sure you discuss any questions you have with your health care provider. Document Revised: 02/14/2019 Document Reviewed: 08/10/2018 Elsevier Patient Education  [place].

## 2019-10-08 NOTE — Chronic Care Management (AMB) (Signed)
Chronic Care Management Pharmacy  Name: Julie Wall  MRN: [id number] DOB: December 12, 1955  Chief Complaint/ HPI  Julie Wall,  64 y.o. , female presents for their Initial CCM visit with the clinical pharmacist via telephone due to COVID-19 Pandemic.  PCP : Steele Sizer, MD  Their chronic conditions include: DM  Office Visits: 5/3 muscle spasm, Sowles, BP 100/50 P 105 Wt 168 Ht 58" BMI 35.15, start baclofen 64m three times daily, Mobic 181mdaily, change lisinopril to losartan 4/2 DM, Sowles, BP 114/68 P 92  Consult Visit: 3/19 Stenosis, Konz ([facility]), Requip XL 62m11mhs, Vit D2 qwk  Medications: Outpatient Encounter Medications as of 10/08/2019  Medication Sig Note  . albuterol (VENTOLIN HFA) 108 (90 Base) MCG/ACT inhaler Inhale 2 puffs into the lungs every 6 (six) hours as needed for wheezing or shortness of breath.   . Alcohol Swabs (ALCOHOL PREP) PADS    . aspirin 81 MG chewable tablet Chew 1 tablet by mouth daily. 01/08/2015: Received from: [facility] . atorvastatin (LIPITOR) 40 MG tablet Take 1 tablet (40 mg total) by mouth daily. In place of simvastatin   . baclofen (LIORESAL) 20 MG tablet Take 1 tablet (20 mg total) by mouth 3 (three) times daily.   . Blood Glucose Monitoring Suppl (ACCU-CHEK AVIVA PLUS) w/Device KIT    . empagliflozin (JARDIANCE) 25 MG TABS tablet Take 25 mg by mouth daily.   . ergocalciferol (VITAMIN D2) 1.25 MG (50000 UT) capsule Take 1 capsule by mouth once a week.   . folic acid (FOLVITE) 1 MG tablet Take by mouth. 06/14/2016: Received from: [facility] 1 mg by mouth once daily.  . gMarland Kitchenbapentin (NEURONTIN) 300 MG capsule Take 1 capsule (300 mg total) by mouth 3 (three) times daily.   . gMarland Kitchenucose blood test strip Check blood sugar once daily fasting, and up to 4 times daily PRN (Patient taking differently: Check blood sugar once daily fasting, and up to 4 times daily PRN ACCU CHEK smartview)     . HYDROcodone-acetaminophen (NORCO/VICODIN) 5-325 MG tablet Take 2 tablets by mouth 2 (two) times daily as needed for moderate pain.   . LElmore Guisevices (SIMPLE DIAGNOSTICS LANCING DEV) MISC Check blood sugar once daily fasting, and up to 4 times daily PRN.   . lMarland Kitchensartan (COZAAR) 25 MG tablet Take 0.5 tablets (12.5 mg total) by mouth daily. In place of lisinopril   . meloxicam (MOBIC) 15 MG tablet Take 1 tablet (15 mg total) by mouth daily.   . metFORMIN (GLUCOPHAGE-XR) 750 MG 24 hr tablet Take 2 tablets (1,500 mg total) by mouth daily with breakfast.   . methotrexate (RHEUMATREX) 2.5 MG tablet Take 15 mg by mouth once a week.  08[facility]12/2015: [facility]int[facility] (KENALOG) 0.1 % Apply topically. Apply to affected area twice daily as directed   . umeclidinium-vilanterol (ANORO ELLIPTA) 62.5-25 MCG/INH AEPB Inhale 1 puff into the lungs daily.   . clobetasol ointment (TEMOVATE) 0.05 % Apply to thicker areas on body twice daily until clear   . fluocinonide ointment (LIDEX) 0.05 % Apply topically.   . rMarland KitchenPINIRole (REQUIP XL) 2 MG 24 hr tablet Take 1 tablet by mouth at bedtime.    No facility-administered encounter medications on file as of 10/08/2019.    Screaming child most of visit  Current Diagnosis/Assessment:  Goals Addressed            This Visit's Progress   .  COPD - goal reduce rescue inhaler use        CARE PLAN ENTRY (see longitudinal plan of care for additional care plan information)  Current Barriers:  . Complex patient with multiple comorbidities including tobacco use, Diabetes, back pain . Self-manages medications by bottles Does not use a pill box or other adherence strategies   Pharmacist Clinical Goal(s):  Marland Kitchen Over the next 90 days, patient will work with PharmD and provider towards reduced rescue inhaler use to less than 1 time daily  Interventions: . Comprehensive medication review performed; medication list updated in electronic medical  record . Inter-disciplinary care team collaboration (see longitudinal plan of care) . Smoking reduction and awareness of rebound reactive airway from abluterol  Patient Self Care Activities:  . Patient will take medications as prescribed . Patient will focus on improved adherence by using rescue inhaler only to avoid exacerbation . Reduce Ventolin use to 3 times weekly over the next 90 days   Initial goal documentation     . Diabetes Mellitus - goa A1c < 7%       CARE PLAN ENTRY (see longitudinal plan of care for additional care plan information)  Current Barriers:  . Diabetes: type 2; complicated by chronic medical conditions including Tobacco use, Chronic pain Lab Results  Component Value Date   HGBA1C 7.1 (A) 09/05/2019 .   Lab Results  Component Value Date   CREATININE 0.70 09/05/2019   CREATININE 0.70 10/21/2018   CREATININE 0.62 03/05/2018 .   Marland Kitchen No results found for: EGFR . Current antihyperglycemic regimen: metformin, Jardiance . Denies hypoglycemic symptoms, including dizziness, lightheadedness, shaking, sweating . Denies hyperglycemic symptoms, including polyuria, polydipsia, polyphagia, nocturia, blurred vision, neuropathy . Current meal patterns: o Snacks: eats cookies between meals . Current exercise: PT . Current blood glucose readings: doesn't check  Pharmacist Clinical Goal(s):  Marland Kitchen Over the next 90 days, patient will work with PharmD and primary care provider to address unnecessary snacking  Interventions: . Comprehensive medication review performed, medication list updated in electronic medical record . Inter-disciplinary care team collaboration (see longitudinal plan of care) . Discussed role of diet in blood glucose control  Patient Self Care Activities:  . Patient to reduce snacking between meals, especially high carbohydrate snacks . Patient to replace physical therapy with home exercise over the next 90 days  Initial goal documentation     .  Tobacco abuse - goal taper to cessation       CARE PLAN ENTRY (see longitudinal plan of care for additional care plan information)  Current Barriers:  . Tobacco abuse of 46 years; currently smoking 0.5 ppd . Previous quit attempts, unsuccessful . Denies smoking within 30 minutes of waking up . Reports triggers to smoke include: family stress . Reports motivation to quit smoking includes: she is strong willed and confident . On a scale of 1-10, reports MOTIVATION to quit is 7 . On a scale of 1-10, reports CONFIDENCE in quitting is 10  Pharmacist Clinical Goal(s):  Marland Kitchen Over the next 30 days, patient will work with PharmD and provider towards tobacco cessation   Interventions: . Comprehensive medication review performed, medication list in electronic medical record updated . Inter-disciplinary care team collaboration (see longitudinal plan of care) . Reduce smoking by 1 cigarette at a time   Patient Self Care Activities:  . Patient will twiddle pen as a habit during cravings  . Patient will commit to reducing tobacco consumption 1 per day each week  Initial goal documentation  Physical Activity: Insufficiently Active  . Days of Exercise per Week: 3 days  . Minutes of Exercise per Session: 40 min   Tobacco Abuse   Tobacco Status:  Social History   Tobacco Use  Smoking Status Current Every Day Smoker  . Packs/day: 0.50  . Years: 46.00  . Pack years: 23.00  . Types: Cigarettes  . Start date: 01/07/1985  Smokeless Tobacco Never Used  Tobacco Comment   [place] smoking cessation program information given    Patient smokes 3 cigarettes daily Patient triggers include: boredom On a scale of 1-10, reports MOTIVATION to quit is 7 On a scale of 1-10, reports CONFIDENCE in quitting is 10  Patient has failed these meds in past: NA Patient is currently uncontrolled  We discussed: methods to keep hands and mouth busy instead of smoking. Suggested large straw cut to  cigarette size and/or pencil/pen twiddling.   Plan  Reduce daily cigarettes by 1 each day with possible quit date in June 2021.  Diabetes   Recent Relevant Labs: Lab Results  Component Value Date/Time   HGBA1C 7.1 (A) 09/05/2019 10:05 AM   HGBA1C 7.3 (A) 05/07/2019 01:58 PM   HGBA1C 7.3 (A) 10/21/2018 09:51 AM   HGBA1C 7.2 (A) 04/19/2018 11:35 AM   HGBA1C 6.5 (A) 01/16/2018 10:56 AM   MICROALBUR 0.2 09/05/2019 11:08 AM   MICROALBUR <0.2 10/21/2018 10:24 AM   MICROALBUR negative 01/16/2018 10:57 AM   MICROALBUR 20 12/13/2016 10:24 AM     Checking BG: Rarely  Patient is currently controlled on the following medications: Jardiance, metformin  Last diabetic Foot exam:  Lab Results  Component Value Date/Time   HMDIABEYEEXA No Retinopathy 06/26/2018 12:00 AM    Last diabetic Eye exam:  Lab Results  Component Value Date/Time   HMDIABFOOTEX normal 08/04/2015 12:00 AM    Switched lisinopril to losartan 12.65m daily for microalb. Kept low due to hypotension  We discussed: Switched lisinopril to losartan 12.572mdaily for microalb. Kept low due to hypotension. Denies Polyuria or worsening hypotension with Jardiance Eats cookies  Plan  Continue current medications  Reduce snacking  COPD / Asthma / Tobacco   Last spirometry score: NA  Eosinophil count:   Lab Results  Component Value Date/Time   EOSPCT 3.2 10/19/2017 09:49 AM  %                               Eos (Absolute):  Lab Results  Component Value Date/Time   EOSABS 253 10/19/2017 09:49 AM   EOSABS 0.4 04/02/2015 09:56 AM    Tobacco Status:  Social History   Tobacco Use  Smoking Status Current Every Day Smoker  . Packs/day: 0.50  . Years: 46.00  . Pack years: 23.00  . Types: Cigarettes  . Start date: 01/07/1985  Smokeless Tobacco Never Used  Tobacco Comment   [place] smoking cessation program information given    Patient has failed these meds in past: NA Patient is currently controlled on the  following medications: Anoro Ellipta, Ventolin Using maintenance inhaler regularly? Yes Frequency of rescue inhaler use:  multiple times per day  We discussed:  smoking cessation anoro ellipta rince mouth Ventolin 1 - 2 times daily, counseled on albuterol rebound  Plan  Continue current medications  Patient will attempt to decrease Ventolin use  Back Pain   Severe spinal stenosis  Patient has failed these meds in past: Tylenol Patient is currently uncontrolled on the  following medications: Vicodin, Baclofen, Mobic  We discussed:  Physical therapy Baclofen 90m three times daily working Requip expired in our system Vicodin three times weekly  Plan  Continue rehab/PT to reduce Vicodin use over the next 90 days Continue current medications  TMilus Height PharmD, BRomilda Garret [phone]

## 2019-10-16 DIAGNOSIS — M5416 Radiculopathy, lumbar region: Secondary | ICD-10-CM | POA: Diagnosis not present

## 2019-10-16 DIAGNOSIS — M5136 Other intervertebral disc degeneration, lumbar region: Secondary | ICD-10-CM | POA: Diagnosis not present

## 2019-10-16 DIAGNOSIS — M48062 Spinal stenosis, lumbar region with neurogenic claudication: Secondary | ICD-10-CM | POA: Diagnosis not present

## 2019-11-05 ENCOUNTER — Ambulatory Visit: Payer: Medicare Other | Admitting: Pharmacist

## 2019-11-05 ENCOUNTER — Encounter: Payer: Self-pay | Admitting: Family Medicine

## 2019-11-05 ENCOUNTER — Other Ambulatory Visit: Payer: Self-pay

## 2019-11-05 ENCOUNTER — Ambulatory Visit (INDEPENDENT_AMBULATORY_CARE_PROVIDER_SITE_OTHER): Payer: Medicare Other | Admitting: Family Medicine

## 2019-11-05 VITALS — BP 126/72 | HR 88 | Temp 97.8°F | Resp 16 | Ht <= 58 in | Wt 171.3 lb

## 2019-11-05 DIAGNOSIS — E1129 Type 2 diabetes mellitus with other diabetic kidney complication: Secondary | ICD-10-CM

## 2019-11-05 DIAGNOSIS — I952 Hypotension due to drugs: Secondary | ICD-10-CM

## 2019-11-05 DIAGNOSIS — L2084 Intrinsic (allergic) eczema: Secondary | ICD-10-CM

## 2019-11-05 DIAGNOSIS — E785 Hyperlipidemia, unspecified: Secondary | ICD-10-CM | POA: Diagnosis not present

## 2019-11-05 DIAGNOSIS — E1159 Type 2 diabetes mellitus with other circulatory complications: Secondary | ICD-10-CM

## 2019-11-05 DIAGNOSIS — Z720 Tobacco use: Secondary | ICD-10-CM

## 2019-11-05 DIAGNOSIS — I10 Essential (primary) hypertension: Secondary | ICD-10-CM

## 2019-11-05 DIAGNOSIS — R809 Proteinuria, unspecified: Secondary | ICD-10-CM

## 2019-11-05 DIAGNOSIS — E1142 Type 2 diabetes mellitus with diabetic polyneuropathy: Secondary | ICD-10-CM

## 2019-11-05 MED ORDER — LOSARTAN POTASSIUM 25 MG PO TABS: 12.5000 mg | ORAL_TABLET | Freq: Every day | ORAL | 0 refills | Status: DC

## 2019-11-05 MED ORDER — METFORMIN HCL ER 750 MG PO TB24: 1500.0000 mg | ORAL_TABLET | Freq: Every day | ORAL | 1 refills | Status: DC

## 2019-11-05 MED ORDER — EMPAGLIFLOZIN 25 MG PO TABS: 25.0000 mg | ORAL_TABLET | Freq: Every day | ORAL | 1 refills | Status: DC

## 2019-11-05 MED ORDER — ATORVASTATIN CALCIUM 40 MG PO TABS: 40.0000 mg | ORAL_TABLET | Freq: Every day | ORAL | 1 refills | Status: DC

## 2019-11-05 NOTE — Patient Instructions (Addendum)
Visit Information  Goals Addressed   None     Print copy of patient instructions provided.   Face to Face appointment with pharmacist scheduled for:  3 months  Felton Clinton, PharmD, [place], CTTS Clinical Pharmacist [facility] [phone]  Steps to Quit Smoking Smoking tobacco is the leading cause of preventable death. It can affect almost every organ in the body. Smoking puts you and people around you at risk for many serious, [place]-lasting (chronic) diseases. Quitting smoking can be hard, but it is one of the best things that you can do for your health. It is never too late to quit. How do I get ready to quit? When you decide to quit smoking, make a plan to help you succeed. Before you quit:  Pick a date to quit. Set a date within the next 2 weeks to give you time to prepare.  Write down the reasons why you are quitting. Keep this list in places where you will see it often.  Tell your family, friends, and co-workers that you are quitting. Their support is important.  Talk with your doctor about the choices that may help you quit.  Find out if your health insurance will pay for these treatments.  Know the people, places, things, and activities that make you want to smoke (triggers). Avoid them. What first steps can I take to quit smoking?  Throw away all cigarettes at home, at work, and in your car.  Throw away the things that you use when you smoke, such as ashtrays and lighters.  Clean your car. Make sure to empty the ashtray.  Clean your home, including curtains and carpets. What can I do to help me quit smoking? Talk with your doctor about taking medicines and seeing a counselor at the same time. You are more likely to succeed when you do both.  If you are pregnant or breastfeeding, talk with your doctor about counseling or other ways to quit smoking. Do not take medicine to help you quit smoking unless your doctor tells you to do so. To quit  smoking: Quit right away  Quit smoking totally, instead of slowly cutting back on how much you smoke over a period of time.  Go to counseling. You are more likely to quit if you go to counseling sessions regularly. Take medicine You may take medicines to help you quit. Some medicines need a prescription, and some you can buy over-the-counter. Some medicines may contain a drug called nicotine to replace the nicotine in cigarettes. Medicines may:  Help you to stop having the desire to smoke (cravings).  Help to stop the problems that come when you stop smoking (withdrawal symptoms). Your doctor may ask you to use:  Nicotine patches, gum, or lozenges.  Nicotine inhalers or sprays.  Non-nicotine medicine that is taken by mouth. Find resources Find resources and other ways to help you quit smoking and remain smoke-free after you quit. These resources are most helpful when you use them often. They include:  Online chats with a Veterinary surgeon.  Phone quitlines.  Printed Materials engineer.  Support groups or group counseling.  Text messaging programs.  Mobile phone apps. Use apps on your mobile phone or tablet that can help you stick to your quit plan. There are many free apps for mobile phones and tablets as well as websites. Examples include Quit Guide from the [employer] and smokefree.gov  What things can I do to make it easier to quit?   Talk to your family  and friends. Ask them to support and encourage you.  Call a phone quitline (1-800-QUIT-NOW), reach out to support groups, or work with a Social worker.  Ask people who smoke to not smoke around you.  Avoid places that make you want to smoke, such as: ? Bars. ? Parties. ? Smoke-break areas at work.  Spend time with people who do not smoke.  Lower the stress in your life. Stress can make you want to smoke. Try these things to help your stress: ? Getting regular exercise. ? Doing deep-breathing exercises. ? Doing  yoga. ? Meditating. ? Doing a body scan. To do this, close your eyes, focus on one area of your body at a time from head to toe. Notice which parts of your body are tense. Try to relax the muscles in those areas. How will I feel when I quit smoking? Day 1 to 3 weeks Within the first 24 hours, you may start to have some problems that come from quitting tobacco. These problems are very bad 2-3 days after you quit, but they do not often last for more than 2-3 weeks. You may get these symptoms:  Mood swings.  Feeling restless, nervous, angry, or annoyed.  Trouble concentrating.  Dizziness.  Strong desire for high-sugar foods and nicotine.  Weight gain.  Trouble pooping (constipation).  Feeling like you may vomit (nausea).  Coughing or a sore throat.  Changes in how the medicines that you take for other issues work in your body.  Depression.  Trouble sleeping (insomnia). Week 3 and afterward After the first 2-3 weeks of quitting, you may start to notice more positive results, such as:  Better sense of smell and taste.  Less coughing and sore throat.  Slower heart rate.  Lower blood pressure.  Clearer skin.  Better breathing.  Fewer sick days. Quitting smoking can be hard. Do not give up if you fail the first time. Some people need to try a few times before they succeed. Do your best to stick to your quit plan, and talk with your doctor if you have any questions or concerns. Summary  Smoking tobacco is the leading cause of preventable death. Quitting smoking can be hard, but it is one of the best things that you can do for your health.  When you decide to quit smoking, make a plan to help you succeed.  Quit smoking right away, not slowly over a period of time.  When you start quitting, seek help from your doctor, family, or friends. This information is not intended to replace advice given to you by your health care provider. Make sure you discuss any questions you  have with your health care provider. Document Revised: 02/14/2019 Document Reviewed: 08/10/2018 Elsevier Patient Education  [place].

## 2019-11-05 NOTE — Progress Notes (Signed)
Name: Julie Wall   MRN: [id number]    DOB: 07-06-1955   Date:11/05/2019       Progress Note  Subjective  Chief Complaint  Chief Complaint  Patient presents with  . Hypertension    3 month follow up  . Hyperlipidemia  . Diabetes    HPI  Diabetes follow up: she was seen in April and A1C was up at 7.1% , she states she is doing better with her diet, avoiding sweet beverages and fasting glucose today was 95 and usually very close 110. She denies polyphagia, polydipsia or polyuria  Eczema: she sees dermatologist at [facility], last visit was with Dr. Sabra Heck- [place]. She is taking Methotrexate 15 mg weekly and has triamcinolone at home, she states her skin is well except for popliteal fossa and inner knees - has some blisters . She states it happens every Summer, she has never been able to show Dermatologist. Advised to contact [facility], try lotion only for now and send them a picture through my chart. The blisters are non tender  HTN: she has associated dyslipidemia and DM, she has been taking half dose of Losartan and bp is now at goal, she denies dizziness or chest pain   Patient Active Problem List   Diagnosis Date Noted  . Morbid obesity ([place]) 06/26/2018  . Atherosclerosis of aorta ([place]) 05/04/2017  . Pap smear abnormality of cervix with ASCUS favoring benign 04/23/2017  . Vitamin D deficiency 12/14/2016  . Obesity (BMI 30.0-34.9) 12/13/2015  . Chronic bronchitis ([place]) 05/14/2015  . DM type 2 with diabetic peripheral neuropathy ([place]) 05/14/2015  . Positive H. pylori test 04/05/2015  . Rapid urease test for Helicobacter pylori infection positive 04/05/2015  . Umbilical hernia 40/98/1191  . Exomphalos 04/01/2015  . RLS (restless legs syndrome) 01/08/2015  . Type 2 diabetes mellitus with other diabetic kidney complication ([place]) 47/82/9562  . Neurogenic claudication 01/08/2015  . AD (atopic dermatitis) 12/28/2014  . Chronic LBP 12/28/2014  . Dyslipidemia 12/28/2014  . Gastroesophageal reflux  disease without esophagitis 12/28/2014  . Microalbuminuria 12/28/2014  . Disorder of bone and cartilage 12/28/2014  . Neuralgia of left thigh 12/28/2014  . Perennial allergic rhinitis with seasonal variation 12/28/2014  . Tobacco abuse 12/28/2014  . Mononeuropathy of left lower extremity 12/28/2014  . Hypothyroidism 09/22/2005  . Hypertension, benign 09/22/2005    Past Surgical History:  Procedure Laterality Date  . CATARACT EXTRACTION Bilateral 1980  . CESAREAN SECTION    . CORNEAL TRANSPLANT Left    [facility]-Treatment for blindness.    Family History  Problem Relation Age of Onset  . Diabetes Mother   . Hypertension Mother   . Pancreatic cancer Mother   . Alcohol abuse Father   . Arthritis/Rheumatoid Sister   . Diabetes Sister   . Kidney disease Sister   . Diabetes Brother   . Breast cancer Neg Hx     Social History   Tobacco Use  . Smoking status: Current Every Day Smoker    Packs/day: 0.50    Years: 46.00    Pack years: 23.00    Types: Cigarettes    Start date: 01/07/1985  . Smokeless tobacco: Never Used  . Tobacco comment: [place] smoking cessation program information given  Substance Use Topics  . Alcohol use: No    Alcohol/week: 0.0 standard drinks     Current Outpatient Medications:  .  albuterol (VENTOLIN HFA) 108 (90 Base) MCG/ACT inhaler, Inhale 2 puffs into the lungs every 6 (six) hours as needed  for wheezing or shortness of breath., Disp: 18 g, Rfl: 0 .  Alcohol Swabs (ALCOHOL PREP) PADS, , Disp: , Rfl:  .  aspirin 81 MG chewable tablet, Chew 1 tablet by mouth daily., Disp: , Rfl:  .  atorvastatin (LIPITOR) 40 MG tablet, Take 1 tablet (40 mg total) by mouth daily. In place of simvastatin, Disp: 90 tablet, Rfl: 1 .  baclofen (LIORESAL) 20 MG tablet, Take 1 tablet (20 mg total) by mouth 3 (three) times daily., Disp: 60 each, Rfl: 0 .  Blood Glucose Monitoring Suppl (ACCU-CHEK AVIVA PLUS) w/Device KIT, , Disp: , Rfl:  .  clobetasol ointment (TEMOVATE)  0.05 %, Apply to thicker areas on body twice daily until clear, Disp: , Rfl:  .  empagliflozin (JARDIANCE) 25 MG TABS tablet, Take 25 mg by mouth daily., Disp: 90 tablet, Rfl: 1 .  fluocinonide ointment (LIDEX) 0.05 %, Apply topically., Disp: , Rfl:  .  folic acid (FOLVITE) 1 MG tablet, Take by mouth., Disp: , Rfl:  .  gabapentin (NEURONTIN) 300 MG capsule, Take 1 capsule (300 mg total) by mouth 3 (three) times daily., Disp: 270 capsule, Rfl: 0 .  glucose blood test strip, Check blood sugar once daily fasting, and up to 4 times daily PRN (Patient taking differently: Check blood sugar once daily fasting, and up to 4 times daily PRN ACCU CHEK smartview), Disp: 100 each, Rfl: 2 .  HYDROcodone-acetaminophen (NORCO/VICODIN) 5-325 MG tablet, Take 2 tablets by mouth 2 (two) times daily as needed for moderate pain., Disp: 30 tablet, Rfl: 0 .  Lancet Devices (SIMPLE DIAGNOSTICS LANCING DEV) MISC, Check blood sugar once daily fasting, and up to 4 times daily PRN., Disp: 100 each, Rfl: 2 .  losartan (COZAAR) 25 MG tablet, Take 0.5 tablets (12.5 mg total) by mouth daily. In place of lisinopril, Disp: 45 tablet, Rfl: 0 .  meloxicam (MOBIC) 15 MG tablet, Take 1 tablet (15 mg total) by mouth daily., Disp: 30 tablet, Rfl: 0 .  metFORMIN (GLUCOPHAGE-XR) 750 MG 24 hr tablet, Take 2 tablets (1,500 mg total) by mouth daily with breakfast., Disp: 180 tablet, Rfl: 1 .  methotrexate (RHEUMATREX) 2.5 MG tablet, Take 15 mg by mouth once a week. , Disp: , Rfl:  .  triamcinolone ointment (KENALOG) 0.1 %, Apply topically. Apply to affected area twice daily as directed, Disp: , Rfl:  .  umeclidinium-vilanterol (ANORO ELLIPTA) 62.5-25 MCG/INH AEPB, Inhale 1 puff into the lungs daily., Disp: 180 each, Rfl: 2 .  rOPINIRole (REQUIP XL) 2 MG 24 hr tablet, Take 1 tablet by mouth at bedtime., Disp: , Rfl:   Allergies  Allergen Reactions  . Penicillins Rash    I personally reviewed active problem list, medication list, allergies,  family history, social history, health maintenance with the patient/caregiver today.   ROS  Ten systems reviewed and is negative except as mentioned in HPI   Objective  Vitals:   11/05/19 1014  BP: 126/72  Pulse: 88  Resp: 16  Temp: 97.8 F (36.6 C)  TempSrc: Temporal  SpO2: 95%  Weight: 171 lb 4.8 oz (77.7 kg)  Height: 4' 10"  (1.473 m)    Body mass index is 35.8 kg/m.  Physical Exam  Constitutional: Patient appears well-developed and well-nourished. Obese  No distress.  HEENT: head atraumatic, normocephalic, pupils equal and reactive to light,  neck supple Cardiovascular: Normal rate, regular rhythm and normal heart sounds.  No murmur heard. No BLE edema. Pulmonary/Chest: Effort normal and breath sounds normal. No respiratory  distress. Abdominal: Soft.  There is no tenderness. Skin: blister on inner knees and popliteal fossa, worse on left side, no signs of infections, advised not to pop blisters  Psychiatric: Patient has a normal mood and affect. behavior is normal. Judgment and thought content normal.  Recent Results (from the past 2160 hour(s))  POCT HgB A1C     Status: Abnormal   Collection Time: 09/05/19 10:05 AM  Result Value Ref Range   Hemoglobin A1C 7.1 (A) 4.0 - 5.6 %   HbA1c POC (<> result, manual entry)     HbA1c, POC (prediabetic range)     HbA1c, POC (controlled diabetic range)    COMPLETE METABOLIC PANEL WITH GFR     Status: Abnormal   Collection Time: 09/05/19 11:08 AM  Result Value Ref Range   Glucose, Bld 101 (H) 65 - 99 mg/dL    Comment: .            Fasting reference interval . For someone without known diabetes, a glucose value between 100 and 125 mg/dL is consistent with prediabetes and should be confirmed with a follow-up test. .    BUN 11 7 - 25 mg/dL   Creat 0.70 0.50 - 0.99 mg/dL    Comment: For patients >93 years of age, the reference limit for Creatinine is approximately 13% higher for people identified as African-American. .     GFR, Est Non African American 91 > OR = 60 mL/min/1.57m   GFR, Est African American 105 > OR = 60 mL/min/1.742m  BUN/Creatinine Ratio NOT APPLICABLE 6 - 22 (calc)   Sodium 142 135 - 146 mmol/L   Potassium 4.5 3.5 - 5.3 mmol/L   Chloride 108 98 - 110 mmol/L   CO2 27 20 - 32 mmol/L   Calcium 9.7 8.6 - 10.4 mg/dL   Total Protein 6.8 6.1 - 8.1 g/dL   Albumin 4.1 3.6 - 5.1 g/dL   Globulin 2.7 1.9 - 3.7 g/dL (calc)   AG Ratio 1.5 1.0 - 2.5 (calc)   Total Bilirubin 0.4 0.2 - 1.2 mg/dL   Alkaline phosphatase (APISO) 79 37 - 153 U/L   AST 14 10 - 35 U/L   ALT 11 6 - 29 U/L  Lipid panel     Status: Abnormal   Collection Time: 09/05/19 11:08 AM  Result Value Ref Range   Cholesterol 91 <200 mg/dL   HDL 45 (L) > OR = 50 mg/dL   Triglycerides 56 <150 mg/dL   LDL Cholesterol (Calc) 32 mg/dL (calc)    Comment: Reference range: <100 . Desirable range <100 mg/dL for primary prevention;   <70 mg/dL for patients with CHD or diabetic patients  with > or = 2 CHD risk factors. . Marland KitchenDL-C is now calculated using the Martin-Hopkins  calculation, which is a validated novel method providing  better accuracy than the Friedewald equation in the  estimation of LDL-C.  MaCresenciano Genret al. JAAnnamaria [id number];170(01 [id number]  (http://education.[employer].com/faq/FAQ164)    Total CHOL/HDL Ratio 2.0 <5.0 (calc)   Non-HDL Cholesterol (Calc) 46 <130 mg/dL (calc)    Comment: For patients with diabetes plus 1 major ASCVD risk  factor, treating to a non-HDL-C goal of <100 mg/dL  (LDL-C of <70 mg/dL) is considered a therapeutic  option.   Microalbumin / creatinine urine ratio     Status: None   Collection Time: 09/05/19 11:08 AM  Result Value Ref Range   Creatinine, Urine 56 20 - 275 mg/dL   Microalb, Ur 0.2 mg/dL  Comment: Reference Range Not established    Microalb Creat Ratio 4 <30 mcg/mg creat    Comment: . The ADA defines abnormalities in albumin excretion as follows: Marland Kitchen Category         Result  (mcg/mg creatinine) . Normal                    <30 Microalbuminuria         30-299  Clinical albuminuria   > OR = 300 . The ADA recommends that at least two of three specimens collected within a 3-6 month period be abnormal before considering a patient to be within a diagnostic category.      PHQ2/9: Depression screen [facility] 2/9 11/05/2019 10/06/2019 09/05/2019 06/12/2019 05/07/2019  Decreased Interest 0 0 0 0 0  Down, Depressed, Hopeless 0 0 0 0 1  PHQ - 2 Score 0 0 0 0 1  Altered sleeping 0 0 0 - 0  Tired, decreased energy 0 0 0 - 1  Change in appetite 0 0 0 - 1  Feeling bad or failure about yourself  0 0 0 - 3  Trouble concentrating 0 0 0 - 0  Moving slowly or fidgety/restless 0 0 0 - 0  Suicidal thoughts 0 0 0 - 0  PHQ-9 Score 0 0 0 - 6  Difficult doing work/chores Not difficult at all - - - Somewhat difficult  Some recent data might be hidden    phq 9 is negative   Fall Risk: Fall Risk  11/05/2019 10/06/2019 09/05/2019 06/12/2019 05/07/2019  Falls in the past year? 0 0 0 1 1  Number falls in past yr: 0 0 0 1 0  Injury with Fall? 0 0 0 0 0  Comment - - - - -  Risk Factor Category  - - - - -  Risk for fall due to : - - - History of fall(s) History of fall(s)  Risk for fall due to: Comment - - - - -  Follow up Falls evaluation completed - - Falls prevention discussed Education provided     Functional Status Survey: Is the patient deaf or have difficulty hearing?: No Does the patient have difficulty seeing, even when wearing glasses/contacts?: No Does the patient have difficulty concentrating, remembering, or making decisions?: No Does the patient have difficulty walking or climbing stairs?: No Does the patient have difficulty dressing or bathing?: No Does the patient have difficulty doing errands alone such as visiting a doctor's office or shopping?: No    Assessment & Plan   1. Type 2 diabetes mellitus with other diabetic kidney complication (HCC)  Recheck A1C next visit,  continue life style modification and medications   2. Intrinsic eczema  Discussed importance of contacting Dermatologist   3. Type 2 diabetes mellitus with microalbuminuria, without Hostetler-term current use of insulin (HCC)  - metFORMIN (GLUCOPHAGE-XR) 750 MG 24 hr tablet; Take 2 tablets (1,500 mg total) by mouth daily with breakfast.  Dispense: 180 tablet; Refill: 1 - empagliflozin (JARDIANCE) 25 MG TABS tablet; Take 1 tablet (25 mg total) by mouth daily.  Dispense: 90 tablet; Refill: 1  4. DM type 2 with diabetic peripheral neuropathy (HCC)  - metFORMIN (GLUCOPHAGE-XR) 750 MG 24 hr tablet; Take 2 tablets (1,500 mg total) by mouth daily with breakfast.  Dispense: 180 tablet; Refill: 1 - empagliflozin (JARDIANCE) 25 MG TABS tablet; Take 1 tablet (25 mg total) by mouth daily.  Dispense: 90 tablet; Refill: 1  5. Dyslipidemia  - atorvastatin (  LIPITOR) 40 MG tablet; Take 1 tablet (40 mg total) by mouth daily. In place of simvastatin  Dispense: 90 tablet; Refill: 1  6. Hypertension associated with diabetes ([place])  We decreased dose of losartan to half pill on her last visit because bp is low and bp is at goal today   Contact dermatologist

## 2019-11-05 NOTE — Chronic Care Management (AMB) (Signed)
Chronic Care Management Pharmacy  Name: Julie Wall  MRN: [id number] DOB: 05-09-1956  Chief Complaint/ HPI  Julie Wall,  64 y.o. , female presents for their Follow-Up CCM visit with the clinical pharmacist In office.  PCP : Julie Sizer, MD  Their chronic conditions include: DM, HTN, HLD  Office Visits:NA  Consult Visit:NA  Medications: Outpatient Encounter Medications as of 11/05/2019  Medication Sig Note  . albuterol (VENTOLIN HFA) 108 (90 Base) MCG/ACT inhaler Inhale 2 puffs into the lungs every 6 (six) hours as needed for wheezing or shortness of breath.   . Alcohol Swabs (ALCOHOL PREP) PADS    . aspirin 81 MG chewable tablet Chew 1 tablet by mouth daily. 01/08/2015: Received from: [place]:   . atorvastatin (LIPITOR) 40 MG tablet Take 1 tablet (40 mg total) by mouth daily. In place of simvastatin   . baclofen (LIORESAL) 20 MG tablet Take 1 tablet (20 mg total) by mouth 3 (three) times daily.   . Blood Glucose Monitoring Suppl (ACCU-CHEK AVIVA PLUS) w/Device KIT    . clobetasol ointment (TEMOVATE) 0.05 % Apply to thicker areas on body twice daily until clear   . empagliflozin (JARDIANCE) 25 MG TABS tablet Take 25 mg by mouth daily.   . fluocinonide ointment (LIDEX) 0.05 % Apply topically.   . folic acid (FOLVITE) 1 MG tablet Take by mouth. 06/14/2016: Received from: [place]: Take 1 mg by mouth once daily.  Marland Kitchen gabapentin (NEURONTIN) 300 MG capsule Take 1 capsule (300 mg total) by mouth 3 (three) times daily.   Marland Kitchen glucose blood test strip Check blood sugar once daily fasting, and up to 4 times daily PRN (Patient taking differently: Check blood sugar once daily fasting, and up to 4 times daily PRN ACCU CHEK smartview)   . HYDROcodone-acetaminophen (NORCO/VICODIN) 5-325 MG tablet Take 2 tablets by mouth 2 (two) times daily as needed for moderate pain.   Elmore Guise Devices (SIMPLE DIAGNOSTICS LANCING DEV) MISC Check  blood sugar once daily fasting, and up to 4 times daily PRN.   Marland Kitchen losartan (COZAAR) 25 MG tablet Take 0.5 tablets (12.5 mg total) by mouth daily. In place of lisinopril   . meloxicam (MOBIC) 15 MG tablet Take 1 tablet (15 mg total) by mouth daily.   . metFORMIN (GLUCOPHAGE-XR) 750 MG 24 hr tablet Take 2 tablets (1,500 mg total) by mouth daily with breakfast.   . methotrexate (RHEUMATREX) 2.5 MG tablet Take 15 mg by mouth once a week.  08/12/2015: Received from: [facility]  . rOPINIRole (REQUIP XL) 2 MG 24 hr tablet Take 1 tablet by mouth at bedtime.   . triamcinolone ointment (KENALOG) 0.1 % Apply topically. Apply to affected area twice daily as directed   . umeclidinium-vilanterol (ANORO ELLIPTA) 62.5-25 MCG/INH AEPB Inhale 1 puff into the lungs daily.    No facility-administered encounter medications on file as of 11/05/2019.     Current Diagnosis/Assessment:  Goals Addressed   None     Tobacco Abuse   Tobacco Status:  Social History   Tobacco Use  Smoking Status Current Every Day Smoker  . Packs/day: 0.50  . Years: 46.00  . Pack years: 23.00  . Types: Cigarettes  . Start date: 01/07/1985  Smokeless Tobacco Never Used  Tobacco Comment   [place] smoking cessation program information given   Patient has failed these meds in past: nicotine patch Patient is currently uncontrolled on the following medications: None  We  discussed:   Progress on cigarette reduction? increased Reduced ventolin use? 1 puff daily Now cig 4 -5 days Anora 1 puff daily 3 months cessation one time - hospital 2.5 months - spO2 5%  Plan  Immerse in play with grandchildren Continue current medications  Diabetes   Recent Relevant Labs: Lab Results  Component Value Date/Time   HGBA1C 7.1 (A) 09/05/2019 10:05 AM   HGBA1C 7.3 (A) 05/07/2019 01:58 PM   HGBA1C 7.3 (A) 10/21/2018 09:51 AM   HGBA1C 7.2 (A) 04/19/2018 11:35 AM   HGBA1C 6.5 (A) 01/16/2018 10:56 AM   MICROALBUR 0.2 09/05/2019  11:08 AM   MICROALBUR <0.2 10/21/2018 10:24 AM   MICROALBUR negative 01/16/2018 10:57 AM   MICROALBUR 20 12/13/2016 10:24 AM     Checking BG: Daily  Recent FBG Readings: 95 - 108 Patient is currently uncontrolled on the following medications: metformin, Jardiance  We discussed:  Recent steroid injections Snacking progress - cut back 167# - will get weighed today  Plan  Continue control with diet and exercise  Back Pain   Patient has failed these meds in past: NA Patient is currently controlled on the following medications: gabapentin, Vicodin, Mobic, steroid injections  We discussed:   Pain scale 5 Average 7 How is Mobic at 30 days? happy  Plan  Continue current medications  Milus Height, PharmD, BCGP, [facility] [phone]

## 2019-11-11 DIAGNOSIS — M5136 Other intervertebral disc degeneration, lumbar region: Secondary | ICD-10-CM | POA: Diagnosis not present

## 2019-11-11 DIAGNOSIS — M48062 Spinal stenosis, lumbar region with neurogenic claudication: Secondary | ICD-10-CM | POA: Diagnosis not present

## 2019-11-11 DIAGNOSIS — M5416 Radiculopathy, lumbar region: Secondary | ICD-10-CM | POA: Diagnosis not present

## 2019-11-26 DIAGNOSIS — H40003 Preglaucoma, unspecified, bilateral: Secondary | ICD-10-CM | POA: Diagnosis not present

## 2019-11-26 DIAGNOSIS — H40009 Preglaucoma, unspecified, unspecified eye: Secondary | ICD-10-CM | POA: Diagnosis not present

## 2019-11-27 ENCOUNTER — Telehealth: Payer: Self-pay | Admitting: Family Medicine

## 2019-11-27 DIAGNOSIS — M4714 Other spondylosis with myelopathy, thoracic region: Secondary | ICD-10-CM | POA: Diagnosis not present

## 2019-11-27 DIAGNOSIS — M48062 Spinal stenosis, lumbar region with neurogenic claudication: Secondary | ICD-10-CM | POA: Diagnosis not present

## 2019-11-27 NOTE — Telephone Encounter (Signed)
Julie Wall from [facility] called to see if the office had any notes or record of the Dr that did the Pts Corneal Transplant/ they need to know which Dr did the pts corneal transplant so they can refer her to the same Dr/please advise

## 2019-12-11 DIAGNOSIS — H52213 Irregular astigmatism, bilateral: Secondary | ICD-10-CM | POA: Diagnosis not present

## 2019-12-26 DIAGNOSIS — Z79899 Other long term (current) drug therapy: Secondary | ICD-10-CM | POA: Diagnosis not present

## 2019-12-31 DIAGNOSIS — M5417 Radiculopathy, lumbosacral region: Secondary | ICD-10-CM | POA: Diagnosis not present

## 2019-12-31 DIAGNOSIS — M5414 Radiculopathy, thoracic region: Secondary | ICD-10-CM | POA: Diagnosis not present

## 2019-12-31 DIAGNOSIS — G5712 Meralgia paresthetica, left lower limb: Secondary | ICD-10-CM | POA: Diagnosis not present

## 2019-12-31 DIAGNOSIS — G2581 Restless legs syndrome: Secondary | ICD-10-CM | POA: Diagnosis not present

## 2020-01-29 DIAGNOSIS — M48062 Spinal stenosis, lumbar region with neurogenic claudication: Secondary | ICD-10-CM | POA: Diagnosis not present

## 2020-01-29 DIAGNOSIS — M4714 Other spondylosis with myelopathy, thoracic region: Secondary | ICD-10-CM | POA: Diagnosis not present

## 2020-02-03 NOTE — Progress Notes (Signed)
Name: Julie Wall   MRN: [id number]    DOB: 1955/11/12   Date:02/04/2020       Progress Note  Subjective  Chief Complaint  Chief Complaint  Patient presents with  . Abdominal Pain  . Flank Pain    I connected with  Meztli Connell on 02/04/20 at 11:20 AM EDT by telephone and verified that I am speaking with the correct person using two identifiers.  I discussed the limitations, risks, security and privacy concerns of performing an evaluation and management service by telephone and the availability of in person appointments. Staff also discussed with the patient that there may be a patient responsible charge related to this service. Patient Location: at home  Provider Location: [facility]   HPI  Patient was originally an office visit. She came in during intake she was off balance and clammy. She had a temperature of 102.9 at intake. She was asked to go back to the car for a virtual visit due to cough and fever. She also complained of having some visual disturbances and loss of appetite. Onset of symptoms was x 5 days.   Patient states symptoms started 5 days ago, started with hematuria, dysuria and urinary frequency, no previous history of kidney stones. She states over the past 4 days she noticed nausea, lack of appetite, she denies fever or chills at home, but was clammy and high temperature . She has pain on left flank not better with activity or rest. She has been feeling very tired, just laying around.   She also has cough, no wheezing but has mild sob. She did not get COVID -19 vaccine. Advised to go to [facility] now since clammy, high temperature and flank pain , may have pyelonephritis or COVID infection    Patient Active Problem List   Diagnosis Date Noted  . Morbid obesity ([place]) 06/26/2018  . Atherosclerosis of aorta ([place]) 05/04/2017  . Pap smear abnormality of cervix with ASCUS favoring benign 04/23/2017  . Vitamin D deficiency 12/14/2016  . Obesity (BMI 30.0-34.9) 12/13/2015  . Chronic  bronchitis ([place]) 05/14/2015  . DM type 2 with diabetic peripheral neuropathy ([place]) 05/14/2015  . Positive H. pylori test 04/05/2015  . Rapid urease test for Helicobacter pylori infection positive 04/05/2015  . Umbilical hernia 50/53/9767  . Exomphalos 04/01/2015  . RLS (restless legs syndrome) 01/08/2015  . Type 2 diabetes mellitus with other diabetic kidney complication ([place]) 34/19/3790  . Neurogenic claudication 01/08/2015  . AD (atopic dermatitis) 12/28/2014  . Chronic LBP 12/28/2014  . Dyslipidemia 12/28/2014  . Gastroesophageal reflux disease without esophagitis 12/28/2014  . Microalbuminuria 12/28/2014  . Disorder of bone and cartilage 12/28/2014  . Neuralgia of left thigh 12/28/2014  . Perennial allergic rhinitis with seasonal variation 12/28/2014  . Tobacco abuse 12/28/2014  . Mononeuropathy of left lower extremity 12/28/2014  . Hypothyroidism 09/22/2005  . Hypertension, benign 09/22/2005    Past Surgical History:  Procedure Laterality Date  . CATARACT EXTRACTION Bilateral 1980  . CESAREAN SECTION    . CORNEAL TRANSPLANT Left    [facility]-Treatment for blindness.    Family History  Problem Relation Age of Onset  . Diabetes Mother   . Hypertension Mother   . Pancreatic cancer Mother   . Alcohol abuse Father   . Arthritis/Rheumatoid Sister   . Diabetes Sister   . Kidney disease Sister   . Diabetes Brother   . Breast cancer Neg Hx     Social History   Socioeconomic History  . Marital status:  Single    Spouse name: Not on file  . Number of children: 3  . Years of education: Not on file  . Highest education level: High school graduate  Occupational History  . Not on file  Tobacco Use  . Smoking status: Current Every Day Smoker    Packs/day: 0.50    Years: 46.00    Pack years: 23.00    Types: Cigarettes    Start date: 01/07/1985  . Smokeless tobacco: Never Used  . Tobacco comment: [place] smoking cessation program information given  Vaping Use  . Vaping  Use: Never used  Substance and Sexual Activity  . Alcohol use: No    Alcohol/week: 0.0 standard drinks  . Drug use: No  . Sexual activity: Not Currently  Other Topics Concern  . Not on file  Social History Narrative   Disabled from severe atopic dermatitis   Has 3 children   Lives alone but very connect with family ( sees sister and her children daily)   Also belongs to a church   Social Determinants of Health   Financial Resource Strain: Low Risk   . Difficulty of Paying Living Expenses: Not very hard  Food Insecurity: No Food Insecurity  . Worried About Charity fundraiser in the Last Year: Never true  . Ran Out of Food in the Last Year: Never true  Transportation Needs: No Transportation Needs  . Lack of Transportation (Medical): No  . Lack of Transportation (Non-Medical): No  Physical Activity: Insufficiently Active  . Days of Exercise per Week: 3 days  . Minutes of Exercise per Session: 40 min  Stress: No Stress Concern Present  . Feeling of Stress : Not at all  Social Connections: Moderately Integrated  . Frequency of Communication with Friends and Family: More than three times a week  . Frequency of Social Gatherings with Friends and Family: More than three times a week  . Attends Religious Services: More than 4 times per year  . Active Member of Clubs or Organizations: Yes  . Attends Archivist Meetings: More than 4 times per year  . Marital Status: Never married  Intimate Partner Violence: Not At Risk  . Fear of Current or Ex-Partner: No  . Emotionally Abused: No  . Physically Abused: No  . Sexually Abused: No     Current Outpatient Medications:  .  albuterol (VENTOLIN HFA) 108 (90 Base) MCG/ACT inhaler, Inhale 2 puffs into the lungs every 6 (six) hours as needed for wheezing or shortness of breath., Disp: 18 g, Rfl: 0 .  Alcohol Swabs (ALCOHOL PREP) PADS, , Disp: , Rfl:  .  aspirin 81 MG chewable tablet, Chew 1 tablet by mouth daily., Disp: , Rfl:   .  atorvastatin (LIPITOR) 40 MG tablet, Take 1 tablet (40 mg total) by mouth daily. In place of simvastatin, Disp: 90 tablet, Rfl: 1 .  baclofen (LIORESAL) 20 MG tablet, Take 1 tablet (20 mg total) by mouth 3 (three) times daily., Disp: 60 each, Rfl: 0 .  Blood Glucose Monitoring Suppl (ACCU-CHEK AVIVA PLUS) w/Device KIT, , Disp: , Rfl:  .  clobetasol ointment (TEMOVATE) 0.05 %, Apply to thicker areas on body twice daily until clear, Disp: , Rfl:  .  dorzolamide-timolol (COSOPT) 22.3-6.8 MG/ML ophthalmic solution, Apply to eye., Disp: , Rfl:  .  empagliflozin (JARDIANCE) 25 MG TABS tablet, Take 1 tablet (25 mg total) by mouth daily., Disp: 90 tablet, Rfl: 1 .  ergocalciferol (VITAMIN D2) 1.25 MG (  50000 UT) capsule, Take by mouth., Disp: , Rfl:  .  fluocinonide ointment (LIDEX) 0.05 %, Apply topically., Disp: , Rfl:  .  folic acid (FOLVITE) 1 MG tablet, Take by mouth., Disp: , Rfl:  .  gabapentin (NEURONTIN) 300 MG capsule, Take 1 capsule (300 mg total) by mouth 3 (three) times daily., Disp: 270 capsule, Rfl: 0 .  glucose blood test strip, Check blood sugar once daily fasting, and up to 4 times daily PRN (Patient taking differently: Check blood sugar once daily fasting, and up to 4 times daily PRN ACCU CHEK smartview), Disp: 100 each, Rfl: 2 .  HYDROcodone-acetaminophen (NORCO/VICODIN) 5-325 MG tablet, Take 2 tablets by mouth 2 (two) times daily as needed for moderate pain., Disp: 30 tablet, Rfl: 0 .  Lancet Devices (SIMPLE DIAGNOSTICS LANCING DEV) MISC, Check blood sugar once daily fasting, and up to 4 times daily PRN., Disp: 100 each, Rfl: 2 .  losartan (COZAAR) 25 MG tablet, Take 0.5 tablets (12.5 mg total) by mouth daily. In place of lisinopril, Disp: 45 tablet, Rfl: 0 .  meloxicam (MOBIC) 15 MG tablet, Take 1 tablet (15 mg total) by mouth daily., Disp: 30 tablet, Rfl: 0 .  metFORMIN (GLUCOPHAGE-XR) 750 MG 24 hr tablet, Take 2 tablets (1,500 mg total) by mouth daily with breakfast., Disp: 180  tablet, Rfl: 1 .  methotrexate (RHEUMATREX) 2.5 MG tablet, Take 15 mg by mouth once a week. , Disp: , Rfl:  .  triamcinolone ointment (KENALOG) 0.1 %, Apply topically. Apply to affected area twice daily as directed, Disp: , Rfl:  .  umeclidinium-vilanterol (ANORO ELLIPTA) 62.5-25 MCG/INH AEPB, Inhale 1 puff into the lungs daily., Disp: 180 each, Rfl: 2 .  rOPINIRole (REQUIP XL) 2 MG 24 hr tablet, Take 1 tablet by mouth at bedtime., Disp: , Rfl:   Allergies  Allergen Reactions  . Penicillins Rash    I personally reviewed active problem list, medication list, allergies, family history, social history with the patient/caregiver today.   ROS  Ten systems reviewed and is negative except as mentioned in HPI   Objective  Virtual encounter, vitals not obtained.  Body mass index is 33.54 kg/m.  Physical Exam  Awake, alert and oriented   PHQ2/9: Depression screen [facility] 2/9 02/04/2020 11/05/2019 10/06/2019 09/05/2019 06/12/2019  Decreased Interest 0 0 0 0 0  Down, Depressed, Hopeless 0 0 0 0 0  PHQ - 2 Score 0 0 0 0 0  Altered sleeping - 0 0 0 -  Tired, decreased energy - 0 0 0 -  Change in appetite - 0 0 0 -  Feeling bad or failure about yourself  - 0 0 0 -  Trouble concentrating - 0 0 0 -  Moving slowly or fidgety/restless - 0 0 0 -  Suicidal thoughts - 0 0 0 -  PHQ-9 Score - 0 0 0 -  Difficult doing work/chores - Not difficult at all - - -  Some recent data might be hidden   PHQ-2/9 Result is negative.    Fall Risk: Fall Risk  02/04/2020 11/05/2019 10/06/2019 09/05/2019 06/12/2019  Falls in the past year? 0 0 0 0 1  Number falls in past yr: 0 0 0 0 1  Injury with Fall? 0 0 0 0 0  Comment - - - - -  Risk Factor Category  - - - - -  Risk for fall due to : - - - - History of fall(s)  Risk for fall due to: Comment - - - - -  Follow up - Falls evaluation completed - - Falls prevention discussed     Assessment & Plan   1. Dysuria  - POCT Urinalysis Dipstick - CULTURE, URINE  COMPREHENSIVE  2. Gross hematuria  - CULTURE, URINE COMPREHENSIVE  3. High fever  - CULTURE, URINE COMPREHENSIVE  4. Cough  Patient will go to [facility] now for further evaluation, advised to wear a mask and state why she is going for.   I discussed the assessment and treatment plan with the patient. The patient was provided an opportunity to ask questions and all were answered. The patient agreed with the plan and demonstrated an understanding of the instructions.   The patient was advised to call back or seek an in-person evaluation if the symptoms worsen or if the condition fails to improve as anticipated.  I provided 25 minutes of non-face-to-face time during this encounter.  Chilton Greathouse, CMA

## 2020-02-04 ENCOUNTER — Other Ambulatory Visit: Payer: Self-pay

## 2020-02-04 ENCOUNTER — Encounter: Payer: Self-pay | Admitting: Family Medicine

## 2020-02-04 ENCOUNTER — Ambulatory Visit (INDEPENDENT_AMBULATORY_CARE_PROVIDER_SITE_OTHER): Payer: Medicare Other | Admitting: Family Medicine

## 2020-02-04 VITALS — HR 131 | Temp 102.9°F | Resp 16 | Wt 160.5 lb

## 2020-02-04 DIAGNOSIS — J1282 Pneumonia due to coronavirus disease 2019: Secondary | ICD-10-CM | POA: Diagnosis present

## 2020-02-04 DIAGNOSIS — R300 Dysuria: Secondary | ICD-10-CM

## 2020-02-04 DIAGNOSIS — M79605 Pain in left leg: Secondary | ICD-10-CM | POA: Diagnosis not present

## 2020-02-04 DIAGNOSIS — E119 Type 2 diabetes mellitus without complications: Secondary | ICD-10-CM | POA: Diagnosis not present

## 2020-02-04 DIAGNOSIS — J9601 Acute respiratory failure with hypoxia: Secondary | ICD-10-CM | POA: Diagnosis not present

## 2020-02-04 DIAGNOSIS — M7989 Other specified soft tissue disorders: Secondary | ICD-10-CM | POA: Diagnosis not present

## 2020-02-04 DIAGNOSIS — E1142 Type 2 diabetes mellitus with diabetic polyneuropathy: Secondary | ICD-10-CM | POA: Diagnosis present

## 2020-02-04 DIAGNOSIS — R310 Gross hematuria: Secondary | ICD-10-CM | POA: Diagnosis not present

## 2020-02-04 DIAGNOSIS — Z7982 Long term (current) use of aspirin: Secondary | ICD-10-CM | POA: Diagnosis not present

## 2020-02-04 DIAGNOSIS — I959 Hypotension, unspecified: Secondary | ICD-10-CM | POA: Diagnosis not present

## 2020-02-04 DIAGNOSIS — Z7984 Long term (current) use of oral hypoglycemic drugs: Secondary | ICD-10-CM

## 2020-02-04 DIAGNOSIS — R509 Fever, unspecified: Secondary | ICD-10-CM | POA: Diagnosis not present

## 2020-02-04 DIAGNOSIS — E039 Hypothyroidism, unspecified: Secondary | ICD-10-CM | POA: Diagnosis not present

## 2020-02-04 DIAGNOSIS — J9600 Acute respiratory failure, unspecified whether with hypoxia or hypercapnia: Secondary | ICD-10-CM | POA: Diagnosis not present

## 2020-02-04 DIAGNOSIS — F17210 Nicotine dependence, cigarettes, uncomplicated: Secondary | ICD-10-CM | POA: Diagnosis not present

## 2020-02-04 DIAGNOSIS — Z79899 Other long term (current) drug therapy: Secondary | ICD-10-CM

## 2020-02-04 DIAGNOSIS — R05 Cough: Secondary | ICD-10-CM

## 2020-02-04 DIAGNOSIS — E1169 Type 2 diabetes mellitus with other specified complication: Secondary | ICD-10-CM | POA: Diagnosis present

## 2020-02-04 DIAGNOSIS — R590 Localized enlarged lymph nodes: Secondary | ICD-10-CM | POA: Diagnosis not present

## 2020-02-04 DIAGNOSIS — R109 Unspecified abdominal pain: Secondary | ICD-10-CM | POA: Diagnosis not present

## 2020-02-04 DIAGNOSIS — J440 Chronic obstructive pulmonary disease with acute lower respiratory infection: Secondary | ICD-10-CM | POA: Diagnosis present

## 2020-02-04 DIAGNOSIS — R0902 Hypoxemia: Secondary | ICD-10-CM | POA: Diagnosis not present

## 2020-02-04 DIAGNOSIS — E785 Hyperlipidemia, unspecified: Secondary | ICD-10-CM | POA: Diagnosis present

## 2020-02-04 DIAGNOSIS — U071 COVID-19: Principal | ICD-10-CM | POA: Diagnosis present

## 2020-02-04 DIAGNOSIS — R059 Cough, unspecified: Secondary | ICD-10-CM

## 2020-02-04 DIAGNOSIS — R531 Weakness: Secondary | ICD-10-CM | POA: Diagnosis not present

## 2020-02-04 DIAGNOSIS — Z791 Long term (current) use of non-steroidal anti-inflammatories (NSAID): Secondary | ICD-10-CM

## 2020-02-04 DIAGNOSIS — G2581 Restless legs syndrome: Secondary | ICD-10-CM | POA: Diagnosis present

## 2020-02-04 DIAGNOSIS — I10 Essential (primary) hypertension: Secondary | ICD-10-CM | POA: Diagnosis present

## 2020-02-04 DIAGNOSIS — K219 Gastro-esophageal reflux disease without esophagitis: Secondary | ICD-10-CM | POA: Diagnosis present

## 2020-02-04 DIAGNOSIS — J90 Pleural effusion, not elsewhere classified: Secondary | ICD-10-CM | POA: Diagnosis not present

## 2020-02-04 DIAGNOSIS — M79604 Pain in right leg: Secondary | ICD-10-CM | POA: Diagnosis not present

## 2020-02-04 LAB — COMPREHENSIVE METABOLIC PANEL
ALT: 18 U/L (ref 0–44)
AST: 42 U/L — ABNORMAL HIGH (ref 15–41)
Albumin: 3.7 g/dL (ref 3.5–5.0)
Alkaline Phosphatase: 55 U/L (ref 38–126)
Anion gap: 13 (ref 5–15)
BUN: 22 mg/dL (ref 8–23)
CO2: 19 mmol/L — ABNORMAL LOW (ref 22–32)
Calcium: 8.5 mg/dL — ABNORMAL LOW (ref 8.9–10.3)
Chloride: 102 mmol/L (ref 98–111)
Creatinine, Ser: 0.85 mg/dL (ref 0.44–1.00)
GFR calc Af Amer: >60 mL/min (ref >60)
GFR calc non Af Amer: >60 mL/min (ref >60)
Glucose, Bld: 135 mg/dL — ABNORMAL HIGH (ref 70–99)
Potassium: 4.1 mmol/L (ref 3.5–5.1)
Sodium: 134 mmol/L — ABNORMAL LOW (ref 135–145)
Total Bilirubin: 1.4 mg/dL — ABNORMAL HIGH (ref 0.3–1.2)
Total Protein: 8 g/dL (ref 6.5–8.1)

## 2020-02-04 LAB — POCT URINALYSIS DIPSTICK
Appearance: ABNORMAL
Bilirubin, UA: POSITIVE
Blood, UA: POSITIVE
Glucose, UA: POSITIVE — AB
Ketones, UA: POSITIVE
Leukocytes, UA: NEGATIVE
Nitrite, UA: NEGATIVE
Odor: ABNORMAL
Protein, UA: POSITIVE — AB
Spec Grav, UA: 1.025 (ref 1.010–1.025)
Urobilinogen, UA: 0.2 E.U./dL
pH, UA: 6 (ref 5.0–8.0)

## 2020-02-04 LAB — CBC
HCT: 44.1 % (ref 36.0–46.0)
Hemoglobin: 15.1 g/dL — ABNORMAL HIGH (ref 12.0–15.0)
MCH: 31.7 pg (ref 26.0–34.0)
MCHC: 34.2 g/dL (ref 30.0–36.0)
MCV: 92.5 fL (ref 80.0–100.0)
Platelets: 133 10*3/uL — ABNORMAL LOW (ref 150–400)
RBC: 4.77 MIL/uL (ref 3.87–5.11)
RDW: 14 % (ref 11.5–15.5)
WBC: 4.7 10*3/uL (ref 4.0–10.5)
nRBC: 0 % (ref 0.0–0.2)

## 2020-02-04 LAB — URINALYSIS, COMPLETE (UACMP) WITH MICROSCOPIC
Bilirubin Urine: NEGATIVE
Glucose, UA: >=500 mg/dL — AB
Ketones, ur: 20 mg/dL — AB
Leukocytes,Ua: NEGATIVE
Nitrite: NEGATIVE
Protein, ur: 100 mg/dL — AB
Specific Gravity, Urine: 1.03 (ref 1.005–1.030)
pH: 5 (ref 5.0–8.0)

## 2020-02-04 LAB — TROPONIN I (HIGH SENSITIVITY): Troponin I (High Sensitivity): 9 ng/L (ref <18)

## 2020-02-04 LAB — LACTIC ACID, PLASMA: Lactic Acid, Venous: 1.1 mmol/L (ref 0.5–1.9)

## 2020-02-04 MED ORDER — ACETAMINOPHEN 325 MG PO TABS: 650.0000 mg | ORAL_TABLET | Freq: Once | ORAL | Status: AC

## 2020-02-04 MED ORDER — ACETAMINOPHEN 325 MG PO TABS
ORAL_TABLET | ORAL | Status: AC
  Administered 2020-02-04: 650 mg via ORAL
  Filled 2020-02-04: qty 2

## 2020-02-04 NOTE — ED Triage Notes (Signed)
Pt in with co dizziness and headache, and decreased appetite. Has had a fever, saw pmd today and was told here due to UTI. States urine has been dark.

## 2020-02-05 ENCOUNTER — Inpatient Hospital Stay
Admission: EM | Admit: 2020-02-05 | Discharge: 2020-02-11 | DRG: 177 | Disposition: A | Discharge status: Home Health | Payer: Medicare Other | Attending: Internal Medicine | Admitting: Internal Medicine

## 2020-02-05 ENCOUNTER — Emergency Department: Payer: Medicare Other

## 2020-02-05 ENCOUNTER — Telehealth: Payer: Self-pay

## 2020-02-05 DIAGNOSIS — J440 Chronic obstructive pulmonary disease with acute lower respiratory infection: Secondary | ICD-10-CM | POA: Diagnosis present

## 2020-02-05 DIAGNOSIS — R109 Unspecified abdominal pain: Secondary | ICD-10-CM | POA: Diagnosis not present

## 2020-02-05 DIAGNOSIS — J189 Pneumonia, unspecified organism: Secondary | ICD-10-CM | POA: Diagnosis not present

## 2020-02-05 DIAGNOSIS — F17210 Nicotine dependence, cigarettes, uncomplicated: Secondary | ICD-10-CM | POA: Diagnosis present

## 2020-02-05 DIAGNOSIS — E039 Hypothyroidism, unspecified: Secondary | ICD-10-CM | POA: Diagnosis present

## 2020-02-05 DIAGNOSIS — E1169 Type 2 diabetes mellitus with other specified complication: Secondary | ICD-10-CM | POA: Diagnosis present

## 2020-02-05 DIAGNOSIS — M79604 Pain in right leg: Secondary | ICD-10-CM | POA: Diagnosis not present

## 2020-02-05 DIAGNOSIS — R531 Weakness: Secondary | ICD-10-CM

## 2020-02-05 DIAGNOSIS — Z79899 Other long term (current) drug therapy: Secondary | ICD-10-CM | POA: Diagnosis not present

## 2020-02-05 DIAGNOSIS — I959 Hypotension, unspecified: Secondary | ICD-10-CM

## 2020-02-05 DIAGNOSIS — U071 COVID-19: Secondary | ICD-10-CM

## 2020-02-05 DIAGNOSIS — I10 Essential (primary) hypertension: Secondary | ICD-10-CM | POA: Diagnosis present

## 2020-02-05 DIAGNOSIS — J1282 Pneumonia due to coronavirus disease 2019: Secondary | ICD-10-CM | POA: Diagnosis present

## 2020-02-05 DIAGNOSIS — R0902 Hypoxemia: Secondary | ICD-10-CM | POA: Diagnosis present

## 2020-02-05 DIAGNOSIS — J90 Pleural effusion, not elsewhere classified: Secondary | ICD-10-CM | POA: Diagnosis not present

## 2020-02-05 DIAGNOSIS — J9601 Acute respiratory failure with hypoxia: Secondary | ICD-10-CM | POA: Diagnosis present

## 2020-02-05 DIAGNOSIS — Z7982 Long term (current) use of aspirin: Secondary | ICD-10-CM | POA: Diagnosis not present

## 2020-02-05 DIAGNOSIS — R590 Localized enlarged lymph nodes: Secondary | ICD-10-CM

## 2020-02-05 DIAGNOSIS — E785 Hyperlipidemia, unspecified: Secondary | ICD-10-CM | POA: Diagnosis present

## 2020-02-05 DIAGNOSIS — R05 Cough: Secondary | ICD-10-CM | POA: Diagnosis not present

## 2020-02-05 DIAGNOSIS — R609 Edema, unspecified: Secondary | ICD-10-CM

## 2020-02-05 DIAGNOSIS — M7989 Other specified soft tissue disorders: Secondary | ICD-10-CM | POA: Diagnosis not present

## 2020-02-05 DIAGNOSIS — E1142 Type 2 diabetes mellitus with diabetic polyneuropathy: Secondary | ICD-10-CM | POA: Diagnosis present

## 2020-02-05 DIAGNOSIS — J9600 Acute respiratory failure, unspecified whether with hypoxia or hypercapnia: Secondary | ICD-10-CM | POA: Diagnosis not present

## 2020-02-05 DIAGNOSIS — K219 Gastro-esophageal reflux disease without esophagitis: Secondary | ICD-10-CM | POA: Diagnosis present

## 2020-02-05 DIAGNOSIS — R319 Hematuria, unspecified: Secondary | ICD-10-CM

## 2020-02-05 DIAGNOSIS — E119 Type 2 diabetes mellitus without complications: Secondary | ICD-10-CM

## 2020-02-05 DIAGNOSIS — M79605 Pain in left leg: Secondary | ICD-10-CM | POA: Diagnosis not present

## 2020-02-05 DIAGNOSIS — G2581 Restless legs syndrome: Secondary | ICD-10-CM | POA: Diagnosis present

## 2020-02-05 DIAGNOSIS — Z7984 Long term (current) use of oral hypoglycemic drugs: Secondary | ICD-10-CM | POA: Diagnosis not present

## 2020-02-05 DIAGNOSIS — Z8616 Personal history of COVID-19: Secondary | ICD-10-CM | POA: Insufficient documentation

## 2020-02-05 DIAGNOSIS — R509 Fever, unspecified: Secondary | ICD-10-CM | POA: Diagnosis not present

## 2020-02-05 DIAGNOSIS — Z791 Long term (current) use of non-steroidal anti-inflammatories (NSAID): Secondary | ICD-10-CM | POA: Diagnosis not present

## 2020-02-05 LAB — GLUCOSE, CAPILLARY
Glucose-Capillary: 133 mg/dL — ABNORMAL HIGH (ref 70–99)
Glucose-Capillary: 151 mg/dL — ABNORMAL HIGH (ref 70–99)
Glucose-Capillary: 127 mg/dL — ABNORMAL HIGH (ref 70–99)

## 2020-02-05 LAB — ABO/RH: ABO/RH(D): B POS

## 2020-02-05 LAB — BRAIN NATRIURETIC PEPTIDE: B Natriuretic Peptide: 12.7 pg/mL (ref 0.0–100.0)

## 2020-02-05 LAB — SARS CORONAVIRUS 2 BY RT PCR (HOSPITAL ORDER, PERFORMED IN ~~LOC~~ HOSPITAL LAB): SARS Coronavirus 2: POSITIVE — AB

## 2020-02-05 LAB — PROCALCITONIN: Procalcitonin: <0.1 ng/mL

## 2020-02-05 MED ORDER — ONDANSETRON HCL 4 MG PO TABS: 4.0000 mg | ORAL_TABLET | Freq: Four times a day (QID) | ORAL | Status: DC | PRN

## 2020-02-05 MED ORDER — INSULIN DETEMIR 100 UNIT/ML ~~LOC~~ SOLN
0.1500 [IU]/kg | Freq: Two times a day (BID) | SUBCUTANEOUS | Status: DC
  Administered 2020-02-05 – 2020-02-06 (×3): 11 [IU] via SUBCUTANEOUS
  Filled 2020-02-05 (×5): qty 0.11

## 2020-02-05 MED ORDER — ASCORBIC ACID 500 MG PO TABS
500.0000 mg | ORAL_TABLET | Freq: Every day | ORAL | Status: DC
  Administered 2020-02-05 – 2020-02-11 (×7): 500 mg via ORAL
  Filled 2020-02-05 (×7): qty 1

## 2020-02-05 MED ORDER — DEXAMETHASONE SODIUM PHOSPHATE 10 MG/ML IJ SOLN
6.0000 mg | INTRAMUSCULAR | Status: DC
  Administered 2020-02-06 – 2020-02-07 (×2): 6 mg via INTRAVENOUS
  Filled 2020-02-05 (×2): qty 1

## 2020-02-05 MED ORDER — GUAIFENESIN-DM 100-10 MG/5ML PO SYRP
10.0000 mL | ORAL_SOLUTION | ORAL | Status: DC | PRN
  Administered 2020-02-07 – 2020-02-08 (×2): 10 mL via ORAL
  Filled 2020-02-05 (×3): qty 10

## 2020-02-05 MED ORDER — SODIUM CHLORIDE 0.9 % IV SOLN: 100.0000 mg | Freq: Every day | INTRAVENOUS | Status: DC

## 2020-02-05 MED ORDER — CLOBETASOL PROPIONATE 0.05 % EX OINT
TOPICAL_OINTMENT | Freq: Two times a day (BID) | CUTANEOUS | Status: DC
  Filled 2020-02-05: qty 15

## 2020-02-05 MED ORDER — SODIUM CHLORIDE 0.9 % IV BOLUS
1000.0000 mL | Freq: Once | INTRAVENOUS | Status: AC
  Administered 2020-02-05: 1000 mL via INTRAVENOUS

## 2020-02-05 MED ORDER — ZINC SULFATE 220 (50 ZN) MG PO CAPS
220.0000 mg | ORAL_CAPSULE | Freq: Every day | ORAL | Status: DC
  Administered 2020-02-05 – 2020-02-11 (×7): 220 mg via ORAL
  Filled 2020-02-05 (×7): qty 1

## 2020-02-05 MED ORDER — HYDROCODONE-ACETAMINOPHEN 5-325 MG PO TABS: 2.0000 | ORAL_TABLET | Freq: Two times a day (BID) | ORAL | Status: DC | PRN

## 2020-02-05 MED ORDER — DEXAMETHASONE SODIUM PHOSPHATE 10 MG/ML IJ SOLN
10.0000 mg | Freq: Once | INTRAMUSCULAR | Status: AC
  Administered 2020-02-05: 10 mg via INTRAVENOUS
  Filled 2020-02-05: qty 1

## 2020-02-05 MED ORDER — GABAPENTIN 300 MG PO CAPS
300.0000 mg | ORAL_CAPSULE | Freq: Three times a day (TID) | ORAL | Status: DC
  Administered 2020-02-05 – 2020-02-11 (×19): 300 mg via ORAL
  Filled 2020-02-05 (×19): qty 1

## 2020-02-05 MED ORDER — ENOXAPARIN SODIUM 40 MG/0.4ML ~~LOC~~ SOLN
40.0000 mg | SUBCUTANEOUS | Status: DC
  Administered 2020-02-05 – 2020-02-11 (×7): 40 mg via SUBCUTANEOUS
  Filled 2020-02-05 (×7): qty 0.4

## 2020-02-05 MED ORDER — ACETAMINOPHEN 325 MG PO TABS
650.0000 mg | ORAL_TABLET | Freq: Four times a day (QID) | ORAL | Status: DC | PRN
  Administered 2020-02-06 – 2020-02-11 (×3): 650 mg via ORAL
  Filled 2020-02-05 (×3): qty 2

## 2020-02-05 MED ORDER — FOLIC ACID 1 MG PO TABS
1.0000 mg | ORAL_TABLET | Freq: Every day | ORAL | Status: DC
  Administered 2020-02-05 – 2020-02-11 (×6): 1 mg via ORAL
  Filled 2020-02-05 (×7): qty 1

## 2020-02-05 MED ORDER — ONDANSETRON HCL 4 MG/2ML IJ SOLN: 4.0000 mg | Freq: Four times a day (QID) | INTRAMUSCULAR | Status: DC | PRN

## 2020-02-05 MED ORDER — SODIUM CHLORIDE 0.9 % IV SOLN
200.0000 mg | Freq: Once | INTRAVENOUS | Status: DC
  Filled 2020-02-05: qty 40

## 2020-02-05 MED ORDER — BACLOFEN 10 MG PO TABS
20.0000 mg | ORAL_TABLET | Freq: Three times a day (TID) | ORAL | Status: DC
  Administered 2020-02-05 – 2020-02-11 (×19): 20 mg via ORAL
  Filled 2020-02-05 (×22): qty 2

## 2020-02-05 MED ORDER — SODIUM CHLORIDE 0.9 % IV SOLN
250.0000 mL | INTRAVENOUS | Status: DC | PRN
  Administered 2020-02-06: 250 mL via INTRAVENOUS

## 2020-02-05 MED ORDER — ATORVASTATIN CALCIUM 20 MG PO TABS
40.0000 mg | ORAL_TABLET | Freq: Every day | ORAL | Status: DC
  Administered 2020-02-05 – 2020-02-11 (×7): 40 mg via ORAL
  Filled 2020-02-05 (×7): qty 2

## 2020-02-05 MED ORDER — ALBUTEROL SULFATE HFA 108 (90 BASE) MCG/ACT IN AERS
2.0000 | INHALATION_SPRAY | Freq: Four times a day (QID) | RESPIRATORY_TRACT | Status: DC | PRN
  Filled 2020-02-05: qty 6.7

## 2020-02-05 MED ORDER — UMECLIDINIUM-VILANTEROL 62.5-25 MCG/INH IN AEPB
1.0000 | INHALATION_SPRAY | Freq: Every day | RESPIRATORY_TRACT | Status: DC
  Administered 2020-02-05 – 2020-02-11 (×7): 1 via RESPIRATORY_TRACT
  Filled 2020-02-05: qty 14

## 2020-02-05 MED ORDER — ASPIRIN 81 MG PO CHEW
81.0000 mg | CHEWABLE_TABLET | Freq: Every day | ORAL | Status: DC
  Administered 2020-02-05 – 2020-02-11 (×7): 81 mg via ORAL
  Filled 2020-02-05 (×7): qty 1

## 2020-02-05 MED ORDER — IOHEXOL 350 MG/ML SOLN
100.0000 mL | Freq: Once | INTRAVENOUS | Status: AC | PRN
  Administered 2020-02-05: 100 mL via INTRAVENOUS

## 2020-02-05 MED ORDER — SODIUM CHLORIDE 0.9% FLUSH
3.0000 mL | Freq: Two times a day (BID) | INTRAVENOUS | Status: DC
  Administered 2020-02-05 – 2020-02-11 (×13): 3 mL via INTRAVENOUS

## 2020-02-05 MED ORDER — SODIUM CHLORIDE 0.9% FLUSH: 3.0000 mL | INTRAVENOUS | Status: DC | PRN

## 2020-02-05 MED ORDER — LOSARTAN POTASSIUM 25 MG PO TABS
12.5000 mg | ORAL_TABLET | Freq: Every day | ORAL | Status: DC
  Administered 2020-02-05 – 2020-02-06 (×2): 12.5 mg via ORAL
  Filled 2020-02-05 (×2): qty 0.5
  Filled 2020-02-05: qty 1

## 2020-02-05 MED ORDER — DORZOLAMIDE HCL-TIMOLOL MAL 2-0.5 % OP SOLN
1.0000 [drp] | Freq: Two times a day (BID) | OPHTHALMIC | Status: DC
  Administered 2020-02-05 – 2020-02-11 (×13): 1 [drp] via OPHTHALMIC
  Filled 2020-02-05: qty 10

## 2020-02-05 MED ORDER — ALBUTEROL SULFATE HFA 108 (90 BASE) MCG/ACT IN AERS
2.0000 | INHALATION_SPRAY | Freq: Four times a day (QID) | RESPIRATORY_TRACT | Status: DC
  Administered 2020-02-05 – 2020-02-11 (×21): 2 via RESPIRATORY_TRACT
  Filled 2020-02-05: qty 6.7

## 2020-02-05 MED ORDER — TRIAMCINOLONE ACETONIDE 0.1 % EX OINT
TOPICAL_OINTMENT | Freq: Two times a day (BID) | CUTANEOUS | Status: DC
  Administered 2020-02-06 – 2020-02-08 (×2): 1 via TOPICAL
  Filled 2020-02-05: qty 15

## 2020-02-05 MED ORDER — INSULIN ASPART 100 UNIT/ML ~~LOC~~ SOLN
0.0000 [IU] | Freq: Three times a day (TID) | SUBCUTANEOUS | Status: DC
  Administered 2020-02-05: 2 [IU] via SUBCUTANEOUS
  Administered 2020-02-05: 3 [IU] via SUBCUTANEOUS
  Administered 2020-02-06: 13:00:00 2 [IU] via SUBCUTANEOUS
  Administered 2020-02-06: 17:00:00 3 [IU] via SUBCUTANEOUS
  Administered 2020-02-07: 18:00:00 5 [IU] via SUBCUTANEOUS
  Administered 2020-02-07: 12:00:00 3 [IU] via SUBCUTANEOUS
  Administered 2020-02-08: 8 [IU] via SUBCUTANEOUS
  Administered 2020-02-08: 5 [IU] via SUBCUTANEOUS
  Administered 2020-02-08: 3 [IU] via SUBCUTANEOUS
  Administered 2020-02-09 (×2): 8 [IU] via SUBCUTANEOUS
  Administered 2020-02-09: 3 [IU] via SUBCUTANEOUS
  Administered 2020-02-10 (×2): 8 [IU] via SUBCUTANEOUS
  Filled 2020-02-05 (×14): qty 1

## 2020-02-05 MED ORDER — SODIUM CHLORIDE 0.9 % IV SOLN
200.0000 mg | Freq: Once | INTRAVENOUS | Status: AC
  Administered 2020-02-05: 200 mg via INTRAVENOUS
  Filled 2020-02-05: qty 200

## 2020-02-05 MED ORDER — SODIUM CHLORIDE 0.9 % IV SOLN
100.0000 mg | Freq: Every day | INTRAVENOUS | Status: AC
  Administered 2020-02-06 – 2020-02-09 (×4): 100 mg via INTRAVENOUS
  Filled 2020-02-05 (×5): qty 20

## 2020-02-05 MED ORDER — VITAMIN D (ERGOCALCIFEROL) 1.25 MG (50000 UNIT) PO CAPS
50000.0000 [IU] | ORAL_CAPSULE | ORAL | Status: DC
  Administered 2020-02-10: 50000 [IU] via ORAL
  Filled 2020-02-05: qty 1

## 2020-02-05 NOTE — Plan of Care (Signed)
  Problem: Education: Goal: Knowledge of [place] Education information/materials will improve Outcome: Progressing Goal: Emotional status will improve Outcome: Progressing Goal: Mental status will improve Outcome: Progressing Goal: Verbalization of understanding the information provided will improve Outcome: Progressing   Problem: Activity: Goal: Interest or engagement in activities will improve Outcome: Progressing Goal: Sleeping patterns will improve Outcome: Progressing   

## 2020-02-05 NOTE — H&P (Signed)
History and Physical    Julie Wall ZOX:[id number] DOB: 1955/10/16 DOA: 02/05/2020  PCP: Alba Cory, MD   Patient coming from: Home  I have personally briefly reviewed patient's old medical records in [facility]  Chief Complaint: Fever  HPI: Julie Wall is a 64 y.o. female with medical history significant for COPD.  Patient presented to the ER for evaluation of fever with a T-max of 102F associated with a cough/congestion which she has had for a week and left sided flank pain which is described as a sharp, intermittent pain and urinary frequency.  She was advised by her primary care provider to go to the ER for evaluation of fever.  She has had associated nausea but denies having any vomiting.  She denies having any dysuria.  She was also noted to have room air pulse oximetry of 86% requiring oxygen supplementation at 2 L. She denies having any headache, no chest pain, no palpitations, no diaphoresis, no dizziness or lightheadedness. CT scan of abdomen pelvis/ CTA lungs shows No CT evidence for acute pulmonary embolus. Interval development of diffuse patchy interstitial and ground-glass alveolar opacity in the lungs bilaterally since lung cancer screening CT of 07/24/2018. Imaging features are nonspecific and may be related to multifocal infectious/inflammatory etiology although pulmonary edema or hemorrhage could have this appearance.Borderline mediastinal and left hilar lymphadenopathy, potentially reactive. Follow-up CT chest in 3 months recommended to ensure resolution. No acute findings in the abdomen/pelvis. Specifically, no findings to suggest pyelonephritis. No urinary stone disease. Chest x-ray reviewed by me shows diffuse bilateral pulmonary infiltrates. Labs show sodium 134, potassium 4.1, chloride 102, bicarb 19, glucose 135, BUN 22, creatinine 0.85, BNP 12.7, procalcitonin less than 0.10, lactic acid 1.1, white count 4.7, hemoglobin 15.1, hematocrit 44, Urinalysis  positive for RBCs and protein but negative for nitrite and leukocyte esterase. SARS coronavirus 2 PCR test is positive Twelve-lead EKG reviewed by me shows sinus tachycardia   ED Course: Patient is a 64 year old female who presents to the ER for evaluation of fever and urinary symptoms.  She was hypoxic in the ER room air pulse oximetry of 86% and this improved following oxygen supplementation at 2 L.  Imaging is suggestive of bilateral infiltrates in patient's COVID-19 PCR test is positive.  She will be admitted to the hospital for further evaluation.  Review of Systems: As per HPI otherwise 10 point review of systems negative.   Past Medical History:  Diagnosis Date  . Allergy   . ASCUS favor benign 09/2013   negative HPV  . Atopic dermatitis    Dermatologist at [facility]  . Chronic low back pain   . COPD (chronic obstructive pulmonary disease) (HCC)   . Decreased exercise tolerance   . Dental caries   . Diabetes mellitus without complication (HCC)   . GERD (gastroesophageal reflux disease)   . Hyperlipidemia   . Hypertension   . Lumbosacral neuritis   . Microalbuminuria   . Osteopenia   . Ovarian failure   . Tobacco use     Past Surgical History:  Procedure Laterality Date  . CATARACT EXTRACTION Bilateral 1980  . CESAREAN SECTION    . CORNEAL TRANSPLANT Left    [facility] for blindness.     reports that she has been smoking cigarettes. She started smoking about 35 years ago. She has a 23.00 pack-year smoking history. She has never used smokeless tobacco. She reports that she does not drink alcohol and does not use drugs.  Allergies  Allergen Reactions  .  Penicillins Rash    Family History  Problem Relation Age of Onset  . Diabetes Mother   . Hypertension Mother   . Pancreatic cancer Mother   . Alcohol abuse Father   . Arthritis/Rheumatoid Sister   . Diabetes Sister   . Kidney disease Sister   . Diabetes Brother   . Breast cancer Neg Hx      Prior to  Admission medications   Medication Sig Start Date End Date Taking? Authorizing Provider  albuterol (VENTOLIN HFA) 108 (90 Base) MCG/ACT inhaler Inhale 2 puffs into the lungs every 6 (six) hours as needed for wheezing or shortness of breath. 09/05/19  Yes Sowles, Danna Hefty, MD  atorvastatin (LIPITOR) 40 MG tablet Take 1 tablet (40 mg total) by mouth daily. In place of simvastatin 11/05/19  Yes Sowles, Danna Hefty, MD  empagliflozin (JARDIANCE) 25 MG TABS tablet Take 1 tablet (25 mg total) by mouth daily. 11/05/19  Yes Sowles, Danna Hefty, MD  gabapentin (NEURONTIN) 100 MG capsule Take 200 mg by mouth 3 (three) times daily.   Yes [provider]  glucose blood test strip Check blood sugar once daily fasting, and up to 4 times daily PRN Patient taking differently: Check blood sugar once daily fasting, and up to 4 times daily PRN ACCU CHEK smartview 06/26/18  Yes Doren Custard, FNP  HYDROcodone-acetaminophen (NORCO/VICODIN) 5-325 MG tablet Take 2 tablets by mouth 2 (two) times daily as needed for moderate pain. 09/05/19  Yes Alba Cory, MD  Lancet Devices (SIMPLE DIAGNOSTICS LANCING DEV) MISC Check blood sugar once daily fasting, and up to 4 times daily PRN. 06/26/18  Yes Doren Custard, FNP  losartan (COZAAR) 25 MG tablet Take 0.5 tablets (12.5 mg total) by mouth daily. In place of lisinopril 11/05/19  Yes Sowles, Danna Hefty, MD  metFORMIN (GLUCOPHAGE-XR) 750 MG 24 hr tablet Take 2 tablets (1,500 mg total) by mouth daily with breakfast. 11/05/19  Yes Sowles, Danna Hefty, MD  aspirin 81 MG chewable tablet Chew 1 tablet by mouth daily. 04/08/09   [provider]  dorzolamide-timolol (COSOPT) 22.3-6.8 MG/ML ophthalmic solution Apply to eye.    [provider]  ergocalciferol (VITAMIN D2) 1.25 MG (50000 UT) capsule Take by mouth. 12/31/19 02/19/20  [provider]  folic acid (FOLVITE) 1 MG tablet Take by mouth.    [provider]  meloxicam (MOBIC) 15 MG tablet Take 1 tablet (15 mg total)  by mouth daily. 10/06/19   Alba Cory, MD  methotrexate (RHEUMATREX) 2.5 MG tablet Take 15 mg by mouth once a week.  07/09/15     rOPINIRole (REQUIP XL) 2 MG 24 hr tablet Take 1 tablet by mouth at bedtime. 07/23/19 09/21/19  Lonell Face, MD  umeclidinium-vilanterol (ANORO ELLIPTA) 62.5-25 MCG/INH AEPB Inhale 1 puff into the lungs daily. 09/05/19   Alba Cory, MD    Physical Exam: Vitals:   02/05/20 0530 02/05/20 0643 02/05/20 0644 02/05/20 0700  BP: 111/74 119/65    Pulse: 99 95 97 96  Resp:   20 (!) 22  Temp:      TempSrc:      SpO2: 90% 96% 95% 95%  Weight:      Height:         Vitals:   02/05/20 0530 02/05/20 0643 02/05/20 0644 02/05/20 0700  BP: 111/74 119/65    Pulse: 99 95 97 96  Resp:   20 (!) 22  Temp:      TempSrc:      SpO2: 90% 96% 95%  95%  Weight:      Height:        Constitutional: NAD, alert and oriented x 3.  Appears comfortable and in no distress Eyes: PERRL, lids and conjunctivae normal ENMT: Mucous membranes are moist.  Neck: normal, supple, no masses, no thyromegaly Respiratory: Bilateral air entry, no wheezing, no crackles. Normal respiratory effort. No accessory muscle use.  Cardiovascular: Tachycardic, no murmurs / rubs / gallops. No extremity edema. 2+ pedal pulses. No carotid bruits.  Abdomen: no tenderness, no masses palpated. No hepatosplenomegaly. Bowel sounds positive.  Musculoskeletal: no clubbing / cyanosis. No joint deformity upper and lower extremities.  Skin: no rashes, lesions, ulcers.  Neurologic: No gross focal neurologic deficit. Psychiatric: Normal mood and affect.   Labs on Admission: I have personally reviewed following labs and imaging studies  CBC: Recent Labs  Lab 02/04/20 2025  WBC 4.7  HGB 15.1*  HCT 44.1  MCV 92.5  PLT 133*   Basic Metabolic Panel: Recent Labs  Lab 02/04/20 2025  NA 134*  K 4.1  CL 102  CO2 19*  GLUCOSE 135*  BUN 22  CREATININE 0.85  CALCIUM 8.5*   GFR: Estimated Creatinine  Clearance: 64.5 mL/min (by C-G formula based on SCr of 0.85 mg/dL). Liver Function Tests: Recent Labs  Lab 02/04/20 2025  AST 42*  ALT 18  ALKPHOS 55  BILITOT 1.4*  PROT 8.0  ALBUMIN 3.7   No results for input(s): LIPASE, AMYLASE in the last 168 hours. No results for input(s): AMMONIA in the last 168 hours. Coagulation Profile: No results for input(s): INR, PROTIME in the last 168 hours. Cardiac Enzymes: No results for input(s): CKTOTAL, CKMB, CKMBINDEX, TROPONINI in the last 168 hours. BNP (last 3 results) No results for input(s): PROBNP in the last 8760 hours. HbA1C: No results for input(s): HGBA1C in the last 72 hours. CBG: No results for input(s): GLUCAP in the last 168 hours. Lipid Profile: No results for input(s): CHOL, HDL, LDLCALC, TRIG, CHOLHDL, LDLDIRECT in the last 72 hours. Thyroid Function Tests: No results for input(s): TSH, T4TOTAL, FREET4, T3FREE, THYROIDAB in the last 72 hours. Anemia Panel: No results for input(s): VITAMINB12, FOLATE, FERRITIN, TIBC, IRON, RETICCTPCT in the last 72 hours. Urine analysis:    Component Value Date/Time   COLORURINE YELLOW (A) 02/04/2020 2025   APPEARANCEUR HAZY (A) 02/04/2020 2025   LABSPEC 1.030 02/04/2020 2025   PHURINE 5.0 02/04/2020 2025   GLUCOSEU >=500 (A) 02/04/2020 2025   HGBUR SMALL (A) 02/04/2020 2025   BILIRUBINUR NEGATIVE 02/04/2020 2025   BILIRUBINUR Positive 02/04/2020 1133   KETONESUR 20 (A) 02/04/2020 2025   PROTEINUR 100 (A) 02/04/2020 2025   UROBILINOGEN 0.2 02/04/2020 1133   NITRITE NEGATIVE 02/04/2020 2025   LEUKOCYTESUR NEGATIVE 02/04/2020 2025    Radiological Exams on Admission: CT Angio Chest PE W and/or Wo Contrast  Result Date: 02/05/2020 CLINICAL DATA:  Dizziness and headache. Flank pain, kidney stone versus pyelonephritis. COVID positive. Fever with mild cough, congestion, left-sided flank pain. EXAM: CT ANGIOGRAPHY CHEST CT ABDOMEN AND PELVIS WITH CONTRAST TECHNIQUE: Multidetector CT imaging  of the chest was performed using the standard protocol during bolus administration of intravenous contrast. Multiplanar CT image reconstructions and MIPs were obtained to evaluate the vascular anatomy. Multidetector CT imaging of the abdomen and pelvis was performed using the standard protocol during bolus administration of intravenous contrast. CONTRAST:  OMNIPAQUE IOHEXOL 350 MG/ML SOLN COMPARISON:  Lung cancer screening chest CT 05/12/2019. CT stone study 07/24/2018. FINDINGS: CTA CHEST FINDINGS  Cardiovascular: Heart size upper normal without substantial pericardial effusion. Atherosclerotic calcification is noted in the wall of the thoracic aorta. No thoracic aortic aneurysm in there is no evidence for dissection of the thoracic aorta. No filling defects within the opacified pulmonary arteries to suggest the presence of an acute pulmonary embolus Mediastinum/Nodes: Borderline mediastinal and left hilar lymphadenopathy evident. Index AP window lymph node measures 10 mm short axis on 29/4. 11 mm short axis left hilar node seen on 30/8/4. Subcarinal node on 43/4 is 14 mm short axis. Small lymph nodes are evident in the right hilum. The esophagus has normal imaging features. 11 mm short axis right retrocrural node evident, stable since 07/24/2018. There is no axillary lymphadenopathy. Lungs/Pleura: There is new diffuse patchy interstitial and ground-glass alveolar opacity in the lungs bilaterally. The ground-glass disease has a central predominance in the upper lobes it is more peripherally predominant in the lower lungs bilaterally. No associated pleural effusion. Musculoskeletal: No worrisome lytic or sclerotic osseous abnormality. Review of the MIP images confirms the above findings. CT ABDOMEN and PELVIS FINDINGS Hepatobiliary: No suspicious focal abnormality within the liver parenchyma. There is no evidence for gallstones, gallbladder wall thickening, or pericholecystic fluid. No intrahepatic or  extrahepatic biliary dilation. Pancreas: No focal mass lesion. No dilatation of the main duct. No intraparenchymal cyst. No peripancreatic edema. Spleen: No splenomegaly. No focal mass lesion. Adrenals/Urinary Tract: No adrenal nodule or mass. Kidneys unremarkable. Specifically, no evidence for urinary stone disease. No segmental edema in either kidney to suggest pyelonephritis. No evidence for hydroureter. The urinary bladder appears normal for the degree of distention. Stomach/Bowel: Stomach is unremarkable. No gastric wall thickening. No evidence of outlet obstruction. Duodenum is normally positioned as is the ligament of Treitz. No small bowel wall thickening. No small bowel dilatation. The terminal ileum is normal. The appendix is normal. No gross colonic mass. No colonic wall thickening. Vascular/Lymphatic: There is abdominal aortic atherosclerosis without aneurysm. There is no gastrohepatic or hepatoduodenal ligament lymphadenopathy. No retroperitoneal or mesenteric lymphadenopathy. No pelvic sidewall lymphadenopathy. 12 mm short axis left groin lymph node is upper normal to borderline increased, but stable since 07/24/2018 suggesting benign reactive etiology. Reproductive: The uterus is unremarkable.  There is no adnexal mass. Other: No intraperitoneal free fluid. Musculoskeletal: No worrisome lytic or sclerotic osseous abnormality. Review of the MIP images confirms the above findings. IMPRESSION: 1. No CT evidence for acute pulmonary embolus. 2. Interval development of diffuse patchy interstitial and ground-glass alveolar opacity in the lungs bilaterally since lung cancer screening CT of 07/24/2018. Imaging features are nonspecific and may be related to multifocal infectious/inflammatory etiology although pulmonary edema or hemorrhage could have this appearance. 3. Borderline mediastinal and left hilar lymphadenopathy, potentially reactive. Follow-up CT chest in 3 months recommended to ensure resolution. 4.  No acute findings in the abdomen/pelvis. Specifically, no findings to suggest pyelonephritis. No urinary stone disease. 5. Aortic Atherosclerosis (ICD10-I70.0). Electronically Signed   By: Kennith Center M.D.   On: 02/05/2020 07:54   CT ABDOMEN PELVIS W CONTRAST  Result Date: 02/05/2020 CLINICAL DATA:  Dizziness and headache. Flank pain, kidney stone versus pyelonephritis. COVID positive. Fever with mild cough, congestion, left-sided flank pain. EXAM: CT ANGIOGRAPHY CHEST CT ABDOMEN AND PELVIS WITH CONTRAST TECHNIQUE: Multidetector CT imaging of the chest was performed using the standard protocol during bolus administration of intravenous contrast. Multiplanar CT image reconstructions and MIPs were obtained to evaluate the vascular anatomy. Multidetector CT imaging of the abdomen and pelvis was performed using the standard protocol  during bolus administration of intravenous contrast. CONTRAST:  100mL OMNIPAQUE IOHEXOL 350 MG/ML SOLN COMPARISON:  Lung cancer screening chest CT 05/12/2019. CT stone study 07/24/2018. FINDINGS: CTA CHEST FINDINGS Cardiovascular: Heart size upper normal without substantial pericardial effusion. Atherosclerotic calcification is noted in the wall of the thoracic aorta. No thoracic aortic aneurysm in there is no evidence for dissection of the thoracic aorta. No filling defects within the opacified pulmonary arteries to suggest the presence of an acute pulmonary embolus Mediastinum/Nodes: Borderline mediastinal and left hilar lymphadenopathy evident. Index AP window lymph node measures 10 mm short axis on 29/4. 11 mm short axis left hilar node seen on 30/8/4. Subcarinal node on 43/4 is 14 mm short axis. Small lymph nodes are evident in the right hilum. The esophagus has normal imaging features. 11 mm short axis right retrocrural node evident, stable since 07/24/2018. There is no axillary lymphadenopathy. Lungs/Pleura: There is new diffuse patchy interstitial and ground-glass alveolar  opacity in the lungs bilaterally. The ground-glass disease has a central predominance in the upper lobes it is more peripherally predominant in the lower lungs bilaterally. No associated pleural effusion. Musculoskeletal: No worrisome lytic or sclerotic osseous abnormality. Review of the MIP images confirms the above findings. CT ABDOMEN and PELVIS FINDINGS Hepatobiliary: No suspicious focal abnormality within the liver parenchyma. There is no evidence for gallstones, gallbladder wall thickening, or pericholecystic fluid. No intrahepatic or extrahepatic biliary dilation. Pancreas: No focal mass lesion. No dilatation of the main duct. No intraparenchymal cyst. No peripancreatic edema. Spleen: No splenomegaly. No focal mass lesion. Adrenals/Urinary Tract: No adrenal nodule or mass. Kidneys unremarkable. Specifically, no evidence for urinary stone disease. No segmental edema in either kidney to suggest pyelonephritis. No evidence for hydroureter. The urinary bladder appears normal for the degree of distention. Stomach/Bowel: Stomach is unremarkable. No gastric wall thickening. No evidence of outlet obstruction. Duodenum is normally positioned as is the ligament of Treitz. No small bowel wall thickening. No small bowel dilatation. The terminal ileum is normal. The appendix is normal. No gross colonic mass. No colonic wall thickening. Vascular/Lymphatic: There is abdominal aortic atherosclerosis without aneurysm. There is no gastrohepatic or hepatoduodenal ligament lymphadenopathy. No retroperitoneal or mesenteric lymphadenopathy. No pelvic sidewall lymphadenopathy. 12 mm short axis left groin lymph node is upper normal to borderline increased, but stable since 07/24/2018 suggesting benign reactive etiology. Reproductive: The uterus is unremarkable.  There is no adnexal mass. Other: No intraperitoneal free fluid. Musculoskeletal: No worrisome lytic or sclerotic osseous abnormality. Review of the MIP images confirms the  above findings. IMPRESSION: 1. No CT evidence for acute pulmonary embolus. 2. Interval development of diffuse patchy interstitial and ground-glass alveolar opacity in the lungs bilaterally since lung cancer screening CT of 07/24/2018. Imaging features are nonspecific and may be related to multifocal infectious/inflammatory etiology although pulmonary edema or hemorrhage could have this appearance. 3. Borderline mediastinal and left hilar lymphadenopathy, potentially reactive. Follow-up CT chest in 3 months recommended to ensure resolution. 4. No acute findings in the abdomen/pelvis. Specifically, no findings to suggest pyelonephritis. No urinary stone disease. 5. Aortic Atherosclerosis (ICD10-I70.0). Electronically Signed   By: Kennith CenterEric  Mansell M.D.   On: 02/05/2020 07:54   DG Chest Portable 1 View  Result Date: 02/05/2020 CLINICAL DATA:  Fever.  Cough. EXAM: PORTABLE CHEST 1 VIEW COMPARISON:  CT 05/12/2019. FINDINGS: Mediastinum hilar structures are unremarkable. Heart size stable. Diffuse bilateral pulmonary infiltrates/edema. Small bilateral pleural effusions cannot be completely excluded. No pneumothorax. IMPRESSION: Diffuse bilateral pulmonary infiltrates/edema. Small bilateral pleural effusions cannot  be completely excluded. Electronically Signed   By: Maisie Fus  Register   On: 02/05/2020 05:27    EKG: Independently reviewed.  Sinus tachycardia  Assessment/Plan Principal Problem:   Pneumonia due to COVID-19 virus Active Problems:   Hypothyroidism   Diabetes mellitus (HCC)   Hypertension, benign   Hilar lymphadenopathy     Pneumonia due to COVID-19 virus Patient is unvaccinated and presented to the emergency room for evaluation of fever. She had a T-max of 102F at home but was noted to be tachycardic in the emergency room and hypoxic with room air pulse oximetry of 86% that improved following oxygen supplementation at 2 L. Chest x-ray shows bibasilar infiltrates and patient's SARS coronavirus 2  PCR test was positive We will start patient on remdesivir per protocol as well as Decadron Continue oxygen supplementation to maintain pulse oximetry greater than 92%   Diabetes mellitus Hold oral hypoglycemic agents for now Place patient on insulin due to risk of hypoglycemia with systemic steroids   Hypothyroidism Stable Continue Synthroid   Hypertension Blood pressure is stable Continue Cozaar   Hilar lymphadenopathy Noted on CT scan. Patient has borderline mediastinal and left hilar lymphadenopathy,potentially reactive. Follow-up CT chest in 3 months recommended to ensure resolution.    DVT prophylaxis: Lovenox Code Status: Full code Family Communication: Greater than 50% of time was spent discussing plan of care with patient at the bedside.  She verbalizes understanding and agrees with the plan. Disposition Plan: Back to previous home environment Consults called: None     Rachelle Edwards MD [facility]     02/05/2020, 9:43 AM

## 2020-02-05 NOTE — Consult Note (Signed)
Remdesivir - Pharmacy Brief Note  A/P:  Remdesivir 200 mg IVPB once followed by 100 mg IVPB daily x 4 days.   Albina Billet, PharmD, BCPS Clinical Pharmacist 02/05/2020 7:11 AM

## 2020-02-05 NOTE — Progress Notes (Signed)
Daughter Carollee Herter called and updated patient will be moved to room 102.

## 2020-02-05 NOTE — ED Provider Notes (Signed)
[facility] Emergency Department Provider Note  ____________________________________________   First MD Initiated Contact with Patient 02/05/20 [phone]     (approximate)  I have reviewed the triage vital signs and the nursing notes.   HISTORY  Chief Complaint Dysuria    HPI Julie Wall is a 64 y.o. female with medical history as listed below which notably includes COPD and who presents for evaluation of fever, mild cough, congestion, left-sided flank pain, and increased urinary frequency couple of days.  She checked with her PCP today but was told that given her fever and respiratory symptoms, she should go to the Emergency Department   for further evaluation of Covid versus UTI/pyelonephritis.  She reports that she had a fever up to 102 earlier today.  She has had some nausea but no vomiting and currently she has no nausea.  She has no abdominal pain but has some pain in the left side of her flank that was sharp and moderate in intensity.  She reports no pain when she urinates but has had increased urinary frequency and the urine appears dark.  She says that she has not been taking antibiotics.  Nothing in particular makes the symptoms better or worse except for exertion making her shortness of breath worse.  She said that she has COPD and her breathing similar to when she has problems with her COPD.  She does not normally use oxygen.  She has not been vaccinated for COVID-19.        Past Medical History:  Diagnosis Date  . Allergy   . ASCUS favor benign 09/2013   negative HPV  . Atopic dermatitis    Dermatologist at [facility]  . Chronic low back pain   . COPD (chronic obstructive pulmonary disease) ([place])   . Decreased exercise tolerance   . Dental caries   . Diabetes mellitus without complication ([place])   . GERD (gastroesophageal reflux disease)   . Hyperlipidemia   . Hypertension   . Lumbosacral neuritis   . Microalbuminuria   . Osteopenia   . Ovarian  failure   . Tobacco use     Patient Active Problem List   Diagnosis Date Noted  . Morbid obesity ([place]) 06/26/2018  . Atherosclerosis of aorta ([place]) 05/04/2017  . Pap smear abnormality of cervix with ASCUS favoring benign 04/23/2017  . Vitamin D deficiency 12/14/2016  . Obesity (BMI 30.0-34.9) 12/13/2015  . Chronic bronchitis ([place]) 05/14/2015  . DM type 2 with diabetic peripheral neuropathy ([place]) 05/14/2015  . Positive H. pylori test 04/05/2015  . Rapid urease test for Helicobacter pylori infection positive 04/05/2015  . Umbilical hernia 02/58/5277  . Exomphalos 04/01/2015  . RLS (restless legs syndrome) 01/08/2015  . Type 2 diabetes mellitus with other diabetic kidney complication ([place]) 82/42/3536  . Neurogenic claudication 01/08/2015  . AD (atopic dermatitis) 12/28/2014  . Chronic LBP 12/28/2014  . Dyslipidemia 12/28/2014  . Gastroesophageal reflux disease without esophagitis 12/28/2014  . Microalbuminuria 12/28/2014  . Disorder of bone and cartilage 12/28/2014  . Neuralgia of left thigh 12/28/2014  . Perennial allergic rhinitis with seasonal variation 12/28/2014  . Tobacco abuse 12/28/2014  . Mononeuropathy of left lower extremity 12/28/2014  . Hypothyroidism 09/22/2005  . Hypertension, benign 09/22/2005    Past Surgical History:  Procedure Laterality Date  . CATARACT EXTRACTION Bilateral 1980  . CESAREAN SECTION    . CORNEAL TRANSPLANT Left    [facility] for blindness.    Prior to Admission medications   Medication Sig Start  Date End Date Taking? Authorizing Provider  albuterol (VENTOLIN HFA) 108 (90 Base) MCG/ACT inhaler Inhale 2 puffs into the lungs every 6 (six) hours as needed for wheezing or shortness of breath. 09/05/19   Steele Sizer, MD  Alcohol Swabs (ALCOHOL PREP) PADS  10/15/13   [provider]  aspirin 81 MG chewable tablet Chew 1 tablet by mouth daily. 04/08/09   [provider]  atorvastatin (LIPITOR) 40 MG tablet Take 1 tablet (40  mg total) by mouth daily. In place of simvastatin 11/05/19   Steele Sizer, MD  baclofen (LIORESAL) 20 MG tablet Take 1 tablet (20 mg total) by mouth 3 (three) times daily. 10/06/19   Steele Sizer, MD  Blood Glucose Monitoring Suppl (ACCU-CHEK AVIVA PLUS) w/Device KIT  04/17/19   [provider]  clobetasol ointment (TEMOVATE) 0.05 % Apply to thicker areas on body twice daily until clear 12/09/18   [provider]  dorzolamide-timolol (COSOPT) 22.3-6.8 MG/ML ophthalmic solution Apply to eye.    [provider]  empagliflozin (JARDIANCE) 25 MG TABS tablet Take 1 tablet (25 mg total) by mouth daily. 11/05/19   Steele Sizer, MD  ergocalciferol (VITAMIN D2) 1.25 MG (50000 UT) capsule Take by mouth. 12/31/19 02/19/20  [provider]  fluocinonide ointment (LIDEX) 0.05 % Apply topically. 12/09/18   [provider]  folic acid (FOLVITE) 1 MG tablet Take by mouth.    [provider]  gabapentin (NEURONTIN) 300 MG capsule Take 1 capsule (300 mg total) by mouth 3 (three) times daily. 01/21/19   Steele Sizer, MD  glucose blood test strip Check blood sugar once daily fasting, and up to 4 times daily PRN Patient taking differently: Check blood sugar once daily fasting, and up to 4 times daily PRN ACCU CHEK smartview 06/26/18   Hubbard Hartshorn, FNP  HYDROcodone-acetaminophen (NORCO/VICODIN) 5-325 MG tablet Take 2 tablets by mouth 2 (two) times daily as needed for moderate pain. 09/05/19   Steele Sizer, MD  Lancet Devices (SIMPLE DIAGNOSTICS LANCING DEV) MISC Check blood sugar once daily fasting, and up to 4 times daily PRN. 06/26/18   Hubbard Hartshorn, FNP  losartan (COZAAR) 25 MG tablet Take 0.5 tablets (12.5 mg total) by mouth daily. In place of lisinopril 11/05/19   Steele Sizer, MD  meloxicam (MOBIC) 15 MG tablet Take 1 tablet (15 mg total) by mouth daily. 10/06/19   Steele Sizer, MD  metFORMIN (GLUCOPHAGE-XR) 750 MG 24 hr tablet Take 2 tablets (1,500 mg  total) by mouth daily with breakfast. 11/05/19   Ancil Boozer, Drue Stager, MD  methotrexate (RHEUMATREX) 2.5 MG tablet Take 15 mg by mouth once a week.  07/09/15     rOPINIRole (REQUIP XL) 2 MG 24 hr tablet Take 1 tablet by mouth at bedtime. 07/23/19 09/21/19  Vladimir Crofts, MD  triamcinolone ointment (KENALOG) 0.1 % Apply topically. Apply to affected area twice daily as directed 04/23/19   [provider]  umeclidinium-vilanterol (ANORO ELLIPTA) 62.5-25 MCG/INH AEPB Inhale 1 puff into the lungs daily. 09/05/19   Steele Sizer, MD    Allergies Penicillins  Family History  Problem Relation Age of Onset  . Diabetes Mother   . Hypertension Mother   . Pancreatic cancer Mother   . Alcohol abuse Father   . Arthritis/Rheumatoid Sister   . Diabetes Sister   . Kidney disease Sister   . Diabetes Brother   . Breast cancer Neg Hx     Social History Social History   Tobacco Use  .  Smoking status: Current Every Day Smoker    Packs/day: 0.50    Years: 46.00    Pack years: 23.00    Types: Cigarettes    Start date: 01/07/1985  . Smokeless tobacco: Never Used  . Tobacco comment: [place] smoking cessation program information given  Vaping Use  . Vaping Use: Never used  Substance Use Topics  . Alcohol use: No    Alcohol/week: 0.0 standard drinks  . Drug use: No    Review of Systems Constitutional: +fever/chills Eyes: No visual changes. ENT: No sore throat. Cardiovascular: Denies chest pain. Respiratory: +shortness of breath, +cough Gastrointestinal: No abdominal pain.  No nausea, no vomiting.  No diarrhea.  No constipation. Genitourinary: Increased urinary frequency.  Negative for dysuria. Musculoskeletal: Some left-sided flank pain, intermittent, not currently present. Integumentary: Negative for rash. Neurological: Negative for headaches, focal weakness or numbness.   ____________________________________________   PHYSICAL EXAM:  VITAL SIGNS: ED Triage Vitals  Enc Vitals  Group     BP 02/04/20 2307 111/64     Pulse Rate 02/04/20 2307 (!) 107     Resp 02/04/20 2307 20     Temp 02/04/20 2307 100 F (37.8 C)     Temp Source 02/04/20 2307 Oral     SpO2 02/04/20 2307 (!) 88 %     Weight 02/04/20 2019 72.6 kg (160 lb)     Height 02/04/20 2019 1.626 m (_0 )     Head Circumference --      Peak Flow --      Pain Score 02/04/20 2019 0     Pain Loc --      Pain Edu? --      Excl. in [place]? --     Constitutional: Alert and oriented. No acute distress at this time. Eyes: Conjunctivae are normal.  Head: Atraumatic. Nose: No congestion/rhinnorhea. Mouth/Throat: Patient is wearing a mask. Neck: No stridor.  No meningeal signs.   Cardiovascular: Mild tachycardia, regular rhythm. Good peripheral circulation. Grossly normal heart sounds. Respiratory: Good air movement, no wheezing. Some coarse breath sounds but minimal. No accessory muscle usage or retractions. Gastrointestinal: Soft and nontender. No distention.  Musculoskeletal: No lower extremity tenderness nor edema. No gross deformities of extremities. Neurologic:  Normal speech and language. No gross focal neurologic deficits are appreciated.  Skin:  Skin is warm, dry and intact. Psychiatric: Mood and affect are normal. Speech and behavior are normal.  ____________________________________________   LABS (all labs ordered are listed, but only abnormal results are displayed)  Labs Reviewed  SARS CORONAVIRUS 2 BY RT PCR (HOSPITAL ORDER, [place] [facility]) - Abnormal; Notable for the following components:      Result Value   SARS Coronavirus 2 POSITIVE (*)    All other components within normal limits  CBC - Abnormal; Notable for the following components:   Hemoglobin 15.1 (*)    Platelets 133 (*)    All other components within normal limits  COMPREHENSIVE METABOLIC PANEL - Abnormal; Notable for the following components:   Sodium 134 (*)    CO2 19 (*)    Glucose, Bld 135 (*)     Calcium 8.5 (*)    AST 42 (*)    Total Bilirubin 1.4 (*)    All other components within normal limits  URINALYSIS, COMPLETE (UACMP) WITH MICROSCOPIC - Abnormal; Notable for the following components:   Color, Urine YELLOW (*)    APPearance HAZY (*)    Glucose, UA >=500 (*)  Hgb urine dipstick SMALL (*)    Ketones, ur 20 (*)    Protein, ur 100 (*)    Bacteria, UA RARE (*)    All other components within normal limits  CULTURE, BLOOD (ROUTINE X 2)  CULTURE, BLOOD (ROUTINE X 2) W REFLEX TO ID PANEL  URINE CULTURE  LACTIC ACID, PLASMA  PROCALCITONIN  BRAIN NATRIURETIC PEPTIDE  TROPONIN I (HIGH SENSITIVITY)   ____________________________________________  EKG  ED ECG REPORT I, Hinda Kehr, the attending physician, personally viewed and interpreted this ECG.  Date: 02/04/2020 EKG Time: 20:09 Rate: 123 Rhythm: sinus tachycardia QRS Axis: normal Intervals: normal ST/T Wave abnormalities: Non-specific ST segment / T-wave changes, but no clear evidence of acute ischemia. Narrative Interpretation: no definitive evidence of acute ischemia; does not meet STEMI criteria.   ____________________________________________  RADIOLOGY I, Hinda Kehr, personally viewed and evaluated these images (plain radiographs) as part of my medical decision making, as well as reviewing the written report by the radiologist.  ED MD interpretation:  Diffuse infiltrates vs edema.  CTA chest and CT abd/pelvis pending at time of signout.  Official radiology report(s): DG Chest Portable 1 View  Result Date: 02/05/2020 CLINICAL DATA:  Fever.  Cough. EXAM: PORTABLE CHEST 1 VIEW COMPARISON:  CT 05/12/2019. FINDINGS: Mediastinum hilar structures are unremarkable. Heart size stable. Diffuse bilateral pulmonary infiltrates/edema. Small bilateral pleural effusions cannot be completely excluded. No pneumothorax. IMPRESSION: Diffuse bilateral pulmonary infiltrates/edema. Small bilateral pleural effusions cannot be  completely excluded. Electronically Signed   By: Marcello Moores  Register   On: 02/05/2020 05:27    ____________________________________________   PROCEDURES   Procedure(s) performed (including Critical Care):  .1-3 Lead EKG Interpretation Performed by: Hinda Kehr, MD Authorized by: Hinda Kehr, MD     Interpretation: abnormal     ECG rate:  110   ECG rate assessment: tachycardic     Rhythm: sinus tachycardia     Ectopy: none     Conduction: normal    .Critical Care Performed by: Hinda Kehr, MD Authorized by: Hinda Kehr, MD   Critical care provider statement:    Critical care time (minutes):  30   Critical care time was exclusive of:  Separately billable procedures and treating other patients   Critical care was necessary to treat or prevent imminent or life-threatening deterioration of the following conditions:  Respiratory failure (COVID-19 pneumonia requiring oxygen)   Critical care was time spent personally by me on the following activities:  Development of treatment plan with patient or surrogate, discussions with consultants, evaluation of patient's response to treatment, examination of patient, obtaining history from patient or surrogate, ordering and performing treatments and interventions, ordering and review of laboratory studies, ordering and review of radiographic studies, pulse oximetry, re-evaluation of patient's condition and review of old charts     ____________________________________________   INITIAL IMPRESSION / MDM / [place] / ED COURSE  As part of my medical decision making, I reviewed the following data within the [place] notes reviewed and incorporated, Labs reviewed , EKG interpreted , Old chart reviewed, Patient signed out to Dr. Vladimir Crofts, Radiograph reviewed  and Notes from prior ED visits   Differential diagnosis includes, but is not limited to, COVID-19, community-acquired pneumonia, COPD  exacerbation/bronchitis, UTI, pyelonephritis, obstructive uropathy.  The patient has not been febrile for [place] but is tachycardic. She reports a fever at home of up to 102 blood cultures have been sent. Lactic acid is normal. I ordered a procalcitonin as well.  She has no leukocytosis. Her comprehensive metabolic panel is essentially normal. High-sensitivity troponin is negative and she is not having any chest pain and it does not need to be repeated. Urinalysis demonstrates ketones, proteinuria, and some red blood cells that could be the result of infection or could represent obstructive uropathy.  Given the respiratory symptoms and lack of COVID-19 vaccination and fever, I will obtain a chest x-ray and COVID-19 PCR test. Anticipate scan of her abdomen and pelvis to evaluate for pyelonephritis and/or ureteral obstruction, but I will await the results of the chest x-ray to determine if the patient would benefit from a CTA chest to evaluate for pulmonary embolism or a CT of the chest in general. Of note, the patient was on 2 L of oxygen when I evaluated her. I took her off the oxygen and within minutes she was satting 86%. She is back on 2 L of oxygen by nasal cannula at this time.  The patient is on the cardiac monitor to evaluate for evidence of arrhythmia and/or significant heart rate changes.     Clinical Course as of Feb 05 720  Thu Feb 05, 2020  0718 SARS Coronavirus 2(!): POSITIVE [CF]  [phone] I ordered remdesivir and Decadron 10 mg IV.  CTA chest and CT abdomen pelvis are pending.  Procalcitonin and lactic acid are normal.  The urinalysis is very unclear and shows hematuria but no evidence of infection.  Urine culture has been ordered but I will hold off on empiric antibiotics at this time given no dysuria no clear evidence of UTI.    [CF]  7616 Transferring ED care to Dr. Vladimir Crofts to follow up scans and admit the patient.   [CF]    Clinical Course User Index [CF] Hinda Kehr, MD      ____________________________________________  FINAL CLINICAL IMPRESSION(S) / ED DIAGNOSES  Final diagnoses:  Acute respiratory failure with hypoxemia ([place])  Lower respiratory tract infection due to COVID-19 virus  Hematuria, unspecified type     MEDICATIONS GIVEN DURING THIS VISIT:  Medications  dexamethasone (DECADRON) injection 10 mg (has no administration in time range)  remdesivir 200 mg in sodium chloride 0.9% 250 mL IVPB (has no administration in time range)    Followed by  remdesivir 100 mg in sodium chloride 0.9 % 100 mL IVPB (has no administration in time range)  acetaminophen (TYLENOL) tablet 650 mg (650 mg Oral Given 02/04/20 2023)  sodium chloride 0.9 % bolus 1,000 mL (1,000 mLs Intravenous New Bag/Given 02/05/20 0523)  iohexol (OMNIPAQUE) 350 MG/ML injection 100 mL (100 mLs Intravenous Contrast Given 02/05/20 0550)     ED Discharge Orders    None      *Please note:  Magdalyn Palmero was evaluated in Emergency Department on 02/05/2020 for the symptoms described in the history of present illness. She was evaluated in the context of the global COVID-19 pandemic, which necessitated consideration that the patient might be at risk for infection with the SARS-CoV-2 virus that causes COVID-19. Institutional protocols and algorithms that pertain to the evaluation of patients at risk for COVID-19 are in a state of rapid change based on information released by regulatory bodies including the CDC and federal and state organizations. These policies and algorithms were followed during the patient's care in the ED.  Some ED evaluations and interventions may be delayed as a result of limited staffing during and after the pandemic.*  Note:  This document was prepared using Dragon voice recognition software and may include unintentional  dictation errors.   Hinda Kehr, MD 02/05/20 [phone]

## 2020-02-05 NOTE — ED Notes (Signed)
Provider at bedside

## 2020-02-05 NOTE — Chronic Care Management (AMB) (Deleted)
   Chronic Care Management Pharmacy  Name: Devann Cribb  MRN: [id number] DOB: [birth year]  Chief Complaint/ HPI  Julie Wall,  64 y.o. , female presents for their Initial CCM visit with the clinical pharmacist via telephone due to COVID-19 Pandemic.  PCP : Alba Cory, MD  Their chronic conditions include: ***  Office Visits:***  Consult Visit:***  Medications: Facility-Administered Encounter Medications as of 02/05/2020  Medication  . dexamethasone (DECADRON) injection 10 mg  . remdesivir 200 mg in sodium chloride 0.9% 250 mL IVPB   Followed by  . [START ON 02/06/2020] remdesivir 100 mg in sodium chloride 0.9 % 100 mL IVPB   Outpatient Encounter Medications as of 02/05/2020  Medication Sig Note  . albuterol (VENTOLIN HFA) 108 (90 Base) MCG/ACT inhaler Inhale 2 puffs into the lungs every 6 (six) hours as needed for wheezing or shortness of breath.   Marland Kitchen aspirin 81 MG chewable tablet Chew 1 tablet by mouth daily. 01/08/2015: Received from: [facility] Received Sig:   . atorvastatin (LIPITOR) 40 MG tablet Take 1 tablet (40 mg total) by mouth daily. In place of simvastatin   . baclofen (LIORESAL) 20 MG tablet Take 1 tablet (20 mg total) by mouth 3 (three) times daily.   . clobetasol ointment (TEMOVATE) 0.05 % Apply to thicker areas on body twice daily until clear   . dorzolamide-timolol (COSOPT) 22.3-6.8 MG/ML ophthalmic solution Apply to eye.   . empagliflozin (JARDIANCE) 25 MG TABS tablet Take 1 tablet (25 mg total) by mouth daily.   . ergocalciferol (VITAMIN D2) 1.25 MG (50000 UT) capsule Take by mouth.   . fluocinonide ointment (LIDEX) 0.05 % Apply topically.   . folic acid (FOLVITE) 1 MG tablet Take by mouth. 06/14/2016: Received from: [facility] Received Sig: Take 1 mg by mouth once daily.  Marland Kitchen gabapentin (NEURONTIN) 300 MG capsule Take 1 capsule (300 mg total) by mouth 3 (three) times daily.   Marland Kitchen glucose blood test strip Check blood sugar once daily  fasting, and up to 4 times daily PRN (Patient taking differently: Check blood sugar once daily fasting, and up to 4 times daily PRN ACCU CHEK smartview)   . HYDROcodone-acetaminophen (NORCO/VICODIN) 5-325 MG tablet Take 2 tablets by mouth 2 (two) times daily as needed for moderate pain.   Demetra Shiner Devices (SIMPLE DIAGNOSTICS LANCING DEV) MISC Check blood sugar once daily fasting, and up to 4 times daily PRN.   Marland Kitchen losartan (COZAAR) 25 MG tablet Take 0.5 tablets (12.5 mg total) by mouth daily. In place of lisinopril   . meloxicam (MOBIC) 15 MG tablet Take 1 tablet (15 mg total) by mouth daily.   . metFORMIN (GLUCOPHAGE-XR) 750 MG 24 hr tablet Take 2 tablets (1,500 mg total) by mouth daily with breakfast.   . methotrexate (RHEUMATREX) 2.5 MG tablet Take 15 mg by mouth once a week.  08/12/2015: Received from: [facility]  . rOPINIRole (REQUIP XL) 2 MG 24 hr tablet Take 1 tablet by mouth at bedtime.   . triamcinolone ointment (KENALOG) 0.1 % Apply topically. Apply to affected area twice daily as directed   . umeclidinium-vilanterol (ANORO ELLIPTA) 62.5-25 MCG/INH AEPB Inhale 1 puff into the lungs daily.      Current Diagnosis/Assessment:  Goals Addressed   None     {[facility] Diagnosis/Assessment:[phone]}

## 2020-02-05 NOTE — Progress Notes (Signed)
Report called to Amber on 1C.

## 2020-02-05 NOTE — ED Notes (Signed)
Pt transported for CT 

## 2020-02-05 NOTE — ED Notes (Signed)
Pt O2 tank was change to a full tank at this time.

## 2020-02-05 NOTE — ED Notes (Signed)
Pt replaced with 2lpm via [place] of oxygen

## 2020-02-05 NOTE — ED Notes (Signed)
Date and time results received: 02/05/20 0635 (use smartphrase ".now" to insert current time)  Test: Covid-19 Critical Value: Covid-19 positive  Name of Provider Notified: Dr. York Cerise  Orders Received? Or Actions Taken?: provdier notified, sign placed on door

## 2020-02-05 NOTE — ED Notes (Signed)
Pt states coming in because her PCP stated she had a UTI. Pt denies pain. Pt on 2LPM via [place]. Pt states she does not normally use oxygen, but does have a history of COPD

## 2020-02-06 DIAGNOSIS — R531 Weakness: Secondary | ICD-10-CM

## 2020-02-06 DIAGNOSIS — I959 Hypotension, unspecified: Secondary | ICD-10-CM

## 2020-02-06 DIAGNOSIS — J9600 Acute respiratory failure, unspecified whether with hypoxia or hypercapnia: Secondary | ICD-10-CM

## 2020-02-06 DIAGNOSIS — E1142 Type 2 diabetes mellitus with diabetic polyneuropathy: Secondary | ICD-10-CM

## 2020-02-06 DIAGNOSIS — E1169 Type 2 diabetes mellitus with other specified complication: Secondary | ICD-10-CM

## 2020-02-06 DIAGNOSIS — E785 Hyperlipidemia, unspecified: Secondary | ICD-10-CM

## 2020-02-06 LAB — CBC WITH DIFFERENTIAL/PLATELET
Abs Immature Granulocytes: 0.05 10*3/uL (ref 0.00–0.07)
Basophils Absolute: 0 10*3/uL (ref 0.0–0.1)
Basophils Relative: 0 %
Eosinophils Absolute: 0 10*3/uL (ref 0.0–0.5)
Eosinophils Relative: 0 %
HCT: 38.8 % (ref 36.0–46.0)
Hemoglobin: 13.1 g/dL (ref 12.0–15.0)
Immature Granulocytes: 1 %
Lymphocytes Relative: 16 %
Lymphs Abs: 1.1 10*3/uL (ref 0.7–4.0)
MCH: 31.8 pg (ref 26.0–34.0)
MCHC: 33.8 g/dL (ref 30.0–36.0)
MCV: 94.2 fL (ref 80.0–100.0)
Monocytes Absolute: 0.4 10*3/uL (ref 0.1–1.0)
Monocytes Relative: 5 %
Neutro Abs: 5.5 10*3/uL (ref 1.7–7.7)
Neutrophils Relative %: 78 %
Platelets: 156 10*3/uL (ref 150–400)
RBC: 4.12 MIL/uL (ref 3.87–5.11)
RDW: 14.4 % (ref 11.5–15.5)
WBC: 7.1 10*3/uL (ref 4.0–10.5)
nRBC: 0 % (ref 0.0–0.2)

## 2020-02-06 LAB — COMPREHENSIVE METABOLIC PANEL
ALT: 15 U/L (ref 0–44)
AST: 36 U/L (ref 15–41)
Albumin: 2.9 g/dL — ABNORMAL LOW (ref 3.5–5.0)
Alkaline Phosphatase: 52 U/L (ref 38–126)
Anion gap: 9 (ref 5–15)
BUN: 21 mg/dL (ref 8–23)
CO2: 20 mmol/L — ABNORMAL LOW (ref 22–32)
Calcium: 8.3 mg/dL — ABNORMAL LOW (ref 8.9–10.3)
Chloride: 108 mmol/L (ref 98–111)
Creatinine, Ser: 0.61 mg/dL (ref 0.44–1.00)
GFR calc Af Amer: >60 mL/min (ref >60)
GFR calc non Af Amer: >60 mL/min (ref >60)
Glucose, Bld: 92 mg/dL (ref 70–99)
Potassium: 4.3 mmol/L (ref 3.5–5.1)
Sodium: 137 mmol/L (ref 135–145)
Total Bilirubin: 1 mg/dL (ref 0.3–1.2)
Total Protein: 6.6 g/dL (ref 6.5–8.1)

## 2020-02-06 LAB — MAGNESIUM: Magnesium: 2.1 mg/dL (ref 1.7–2.4)

## 2020-02-06 LAB — CULTURE, URINE COMPREHENSIVE
MICRO NUMBER:: 10903126
RESULT:: NO GROWTH
SPECIMEN QUALITY:: ADEQUATE

## 2020-02-06 LAB — URINE CULTURE: Culture: NO GROWTH

## 2020-02-06 LAB — FIBRIN DERIVATIVES D-DIMER (ARMC ONLY): Fibrin derivatives D-dimer (ARMC): 1004.34 ng/mL (FEU) — ABNORMAL HIGH (ref 0.00–499.00)

## 2020-02-06 LAB — GLUCOSE, CAPILLARY
Glucose-Capillary: 85 mg/dL (ref 70–99)
Glucose-Capillary: 123 mg/dL — ABNORMAL HIGH (ref 70–99)
Glucose-Capillary: 190 mg/dL — ABNORMAL HIGH (ref 70–99)

## 2020-02-06 LAB — PHOSPHORUS: Phosphorus: 2.7 mg/dL (ref 2.5–4.6)

## 2020-02-06 LAB — FERRITIN: Ferritin: 767 ng/mL — ABNORMAL HIGH (ref 11–307)

## 2020-02-06 LAB — C-REACTIVE PROTEIN: CRP: 10.6 mg/dL — ABNORMAL HIGH (ref <1.0)

## 2020-02-06 LAB — HIV ANTIBODY (ROUTINE TESTING W REFLEX): HIV Screen 4th Generation wRfx: NONREACTIVE

## 2020-02-06 MED ORDER — INSULIN DETEMIR 100 UNIT/ML ~~LOC~~ SOLN
0.1500 [IU]/kg | Freq: Every day | SUBCUTANEOUS | Status: DC
  Administered 2020-02-07 – 2020-02-10 (×4): 11 [IU] via SUBCUTANEOUS
  Filled 2020-02-06 (×4): qty 0.11

## 2020-02-06 NOTE — Progress Notes (Signed)
Patient ID: Julie Wall, female   DOB: 12/03/1955, 64 y.o.   MRN: [id number] [facility] PROGRESS NOTE  Julie Wall WUJ:[id number] DOB: 12/17/1955 DOA: 02/05/2020 PCP: Alba CorySowles, Krichna, MD  HPI/Subjective: Patient feeling a little bit better.  Had some cough when she came in.  She stated she went to her primary care physician's office and went home and was told to come to the hospital.  Admitted yesterday with fever and found to be Covid positive.  Patient on 6 L of oxygen when I saw her this morning and I dialed her down to 4 L.  Objective: Vitals:   02/06/20 0740 02/06/20 1150  BP: 120/70 101/63  Pulse: 91 85  Resp: 20 (!) 22  Temp: 97.6 F (36.4 C) 98.8 F (37.1 C)  SpO2: 95% 92%   No intake or output data in the 24 hours ending 02/06/20 1242 Filed Weights   02/04/20 2019  Weight: 72.6 kg    ROS: Review of Systems  Respiratory: Positive for cough and shortness of breath.   Cardiovascular: Negative for chest pain.  Gastrointestinal: Negative for abdominal pain, diarrhea, nausea and vomiting.   Exam: Physical Exam HENT:     Nose: No mucosal edema.     Mouth/Throat:     Pharynx: No oropharyngeal exudate.  Eyes:     General: Lids are normal.     Conjunctiva/sclera: Conjunctivae normal.  Cardiovascular:     Rate and Rhythm: Normal rate and regular rhythm.     Heart sounds: Normal heart sounds, S1 normal and S2 normal.  Pulmonary:     Breath sounds: Examination of the right-lower field reveals decreased breath sounds and rhonchi. Examination of the left-lower field reveals decreased breath sounds and rhonchi. Decreased breath sounds and rhonchi present. No wheezing or rales.  Abdominal:     Palpations: Abdomen is soft.     Tenderness: There is no abdominal tenderness.  Musculoskeletal:     Right ankle: No swelling.     Left ankle: No swelling.  Skin:    General: Skin is warm.     Findings: No rash.     Comments: Vitiligo lower extremities with lightening of skin.   Neurological:     Mental Status: She is alert and oriented to person, place, and time.       Data Reviewed: Basic Metabolic Panel: Recent Labs  Lab 02/04/20 2025 02/06/20 0453  NA 134* 137  K 4.1 4.3  CL 102 108  CO2 19* 20*  GLUCOSE 135* 92  BUN 22 21  CREATININE 0.85 0.61  CALCIUM 8.5* 8.3*  MG  --  2.1  PHOS  --  2.7   Liver Function Tests: Recent Labs  Lab 02/04/20 2025 02/06/20 0453  AST 42* 36  ALT 18 15  ALKPHOS 55 52  BILITOT 1.4* 1.0  PROT 8.0 6.6  ALBUMIN 3.7 2.9*   CBC: Recent Labs  Lab 02/04/20 2025 02/06/20 0453  WBC 4.7 7.1  NEUTROABS  --  5.5  HGB 15.1* 13.1  HCT 44.1 38.8  MCV 92.5 94.2  PLT 133* 156   BNP (last 3 results) Recent Labs    02/05/20 0707  BNP 12.7    CBG: Recent Labs  Lab 02/05/20 1120 02/05/20 1810 02/05/20 1936 02/06/20 0730 02/06/20 1151  GLUCAP 133* 151* 127* 85 123*    Recent Results (from the past 240 hour(s))  CULTURE, URINE COMPREHENSIVE     Status: None (Preliminary result)   Collection Time: 02/04/20  1:52 PM  Specimen: Urine  Result Value Ref Range Status   MICRO NUMBER: [id number]  Preliminary   SPECIMEN QUALITY: Adequate  Preliminary   Source OTHER (SPECIFY)  Preliminary   STATUS: PRELIMINARY  Preliminary   RESULT: Culture in progress  Preliminary  Blood culture (routine x 2)     Status: None (Preliminary result)   Collection Time: 02/04/20  8:25 PM   Specimen: BLOOD  Result Value Ref Range Status   Specimen Description BLOOD LEFT AC  Final   Special Requests   Final    BOTTLES DRAWN AEROBIC AND ANAEROBIC Blood Culture adequate volume   Culture   Final    NO GROWTH 2 DAYS Performed at [facility], [address], [place], [place] [address]    Report Status PENDING  Incomplete  Urine Culture     Status: None (Preliminary result)   Collection Time: 02/04/20  8:48 PM   Specimen: Urine, Clean Catch  Result Value Ref Range Status   Specimen Description   Final    URINE, CLEAN  CATCH Performed at [facility], [address] [place], [place] [address]    Special Requests   Final    NONE Performed at [facility], [address] [place], [place] [address]    Culture PENDING  Incomplete   Report Status PENDING  Incomplete  SARS Coronavirus 2 by RT PCR (hospital order, performed in [facility] hospital lab) Nasopharyngeal Nasopharyngeal Swab     Status: Abnormal   Collection Time: 02/05/20  4:59 AM   Specimen: Nasopharyngeal Swab  Result Value Ref Range Status   SARS Coronavirus 2 POSITIVE (A) NEGATIVE Final    Comment: RESULT CALLED TO, READ BACK BY AND VERIFIED WITH: ASHTON PETERS AT 0632 ON 02/05/2020 [facility]. (NOTE) SARS-CoV-2 target nucleic acids are DETECTED  SARS-CoV-2 RNA is generally detectable in upper respiratory specimens  during the acute phase of infection.  Positive results are indicative  of the presence of the identified virus, but do not rule out bacterial infection or co-infection with other pathogens not detected by the test.  Clinical correlation with patient history and  other diagnostic information is necessary to determine patient infection status.  The expected result is negative.  Fact Sheet for Patients:   BoilerBrush.com.cy   Fact Sheet for Healthcare Providers:   https://pope.com/    This test is not yet approved or cleared by the [place] FDA and  has been authorized for detection and/or diagnosis of SARS-CoV-2 by FDA under an Emergency Use Authorization (EUA).  This EUA will remain in effect (meaning thi s test can be used) for the duration of  the COVID-19 declaration under Section 564(b)(1) of the Act, 21 U.S.C. section 360-bbb-3(b)(1), unless the authorization is terminated or revoked sooner.  Performed at [facility], [address] [place], [place] [address]   Culture, blood (Routine X 2) w Reflex to ID Panel     Status: None (Preliminary  result)   Collection Time: 02/05/20  5:09 AM   Specimen: BLOOD  Result Value Ref Range Status   Specimen Description BLOOD LEFT ANTECUBITAL  Final   Special Requests   Final    BOTTLES DRAWN AEROBIC AND ANAEROBIC Blood Culture adequate volume   Culture   Final    NO GROWTH < 24 HOURS Performed at [facility], [address], [place], [place] [address]    Report Status PENDING  Incomplete     Studies: CT Angio Chest PE W and/or  Wo Contrast  Result Date: 02/05/2020 CLINICAL DATA:  Dizziness and headache. Flank pain, kidney stone versus pyelonephritis. COVID positive. Fever with mild cough, congestion, left-sided flank pain. EXAM: CT ANGIOGRAPHY CHEST CT ABDOMEN AND PELVIS WITH CONTRAST TECHNIQUE: Multidetector CT imaging of the chest was performed using the standard protocol during bolus administration of intravenous contrast. Multiplanar CT image reconstructions and MIPs were obtained to evaluate the vascular anatomy. Multidetector CT imaging of the abdomen and pelvis was performed using the standard protocol during bolus administration of intravenous contrast. CONTRAST:  OMNIPAQUE IOHEXOL 350 MG/ML SOLN COMPARISON:  Lung cancer screening chest CT 05/12/2019. CT stone study 07/24/2018. FINDINGS: CTA CHEST FINDINGS Cardiovascular: Heart size upper normal without substantial pericardial effusion. Atherosclerotic calcification is noted in the wall of the thoracic aorta. No thoracic aortic aneurysm in there is no evidence for dissection of the thoracic aorta. No filling defects within the opacified pulmonary arteries to suggest the presence of an acute pulmonary embolus Mediastinum/Nodes: Borderline mediastinal and left hilar lymphadenopathy evident. Index AP window lymph node measures 10 mm short axis on 29/4. 11 mm short axis left hilar node seen on 30/8/4. Subcarinal node on 43/4 is 14 mm short axis. Small lymph nodes are evident in the right hilum. The esophagus has normal imaging  features. 11 mm short axis right retrocrural node evident, stable since 07/24/2018. There is no axillary lymphadenopathy. Lungs/Pleura: There is new diffuse patchy interstitial and ground-glass alveolar opacity in the lungs bilaterally. The ground-glass disease has a central predominance in the upper lobes it is more peripherally predominant in the lower lungs bilaterally. No associated pleural effusion. Musculoskeletal: No worrisome lytic or sclerotic osseous abnormality. Review of the MIP images confirms the above findings. CT ABDOMEN and PELVIS FINDINGS Hepatobiliary: No suspicious focal abnormality within the liver parenchyma. There is no evidence for gallstones, gallbladder wall thickening, or pericholecystic fluid. No intrahepatic or extrahepatic biliary dilation. Pancreas: No focal mass lesion. No dilatation of the main duct. No intraparenchymal cyst. No peripancreatic edema. Spleen: No splenomegaly. No focal mass lesion. Adrenals/Urinary Tract: No adrenal nodule or mass. Kidneys unremarkable. Specifically, no evidence for urinary stone disease. No segmental edema in either kidney to suggest pyelonephritis. No evidence for hydroureter. The urinary bladder appears normal for the degree of distention. Stomach/Bowel: Stomach is unremarkable. No gastric wall thickening. No evidence of outlet obstruction. Duodenum is normally positioned as is the ligament of Treitz. No small bowel wall thickening. No small bowel dilatation. The terminal ileum is normal. The appendix is normal. No gross colonic mass. No colonic wall thickening. Vascular/Lymphatic: There is abdominal aortic atherosclerosis without aneurysm. There is no gastrohepatic or hepatoduodenal ligament lymphadenopathy. No retroperitoneal or mesenteric lymphadenopathy. No pelvic sidewall lymphadenopathy. 12 mm short axis left groin lymph node is upper normal to borderline increased, but stable since 07/24/2018 suggesting benign reactive etiology. Reproductive:  The uterus is unremarkable.  There is no adnexal mass. Other: No intraperitoneal free fluid. Musculoskeletal: No worrisome lytic or sclerotic osseous abnormality. Review of the MIP images confirms the above findings. IMPRESSION: 1. No CT evidence for acute pulmonary embolus. 2. Interval development of diffuse patchy interstitial and ground-glass alveolar opacity in the lungs bilaterally since lung cancer screening CT of 07/24/2018. Imaging features are nonspecific and may be related to multifocal infectious/inflammatory etiology although pulmonary edema or hemorrhage could have this appearance. 3. Borderline mediastinal and left hilar lymphadenopathy, potentially reactive. Follow-up CT chest in 3 months recommended to ensure resolution. 4. No acute findings in the abdomen/pelvis. Specifically, no findings  to suggest pyelonephritis. No urinary stone disease. 5. Aortic Atherosclerosis (ICD10-I70.0). Electronically Signed   By: Kennith Center M.D.   On: 02/05/2020 07:54   CT ABDOMEN PELVIS W CONTRAST  Result Date: 02/05/2020 CLINICAL DATA:  Dizziness and headache. Flank pain, kidney stone versus pyelonephritis. COVID positive. Fever with mild cough, congestion, left-sided flank pain. EXAM: CT ANGIOGRAPHY CHEST CT ABDOMEN AND PELVIS WITH CONTRAST TECHNIQUE: Multidetector CT imaging of the chest was performed using the standard protocol during bolus administration of intravenous contrast. Multiplanar CT image reconstructions and MIPs were obtained to evaluate the vascular anatomy. Multidetector CT imaging of the abdomen and pelvis was performed using the standard protocol during bolus administration of intravenous contrast. CONTRAST:  OMNIPAQUE IOHEXOL 350 MG/ML SOLN COMPARISON:  Lung cancer screening chest CT 05/12/2019. CT stone study 07/24/2018. FINDINGS: CTA CHEST FINDINGS Cardiovascular: Heart size upper normal without substantial pericardial effusion. Atherosclerotic calcification is noted in the wall of  the thoracic aorta. No thoracic aortic aneurysm in there is no evidence for dissection of the thoracic aorta. No filling defects within the opacified pulmonary arteries to suggest the presence of an acute pulmonary embolus Mediastinum/Nodes: Borderline mediastinal and left hilar lymphadenopathy evident. Index AP window lymph node measures 10 mm short axis on 29/4. 11 mm short axis left hilar node seen on 30/8/4. Subcarinal node on 43/4 is 14 mm short axis. Small lymph nodes are evident in the right hilum. The esophagus has normal imaging features. 11 mm short axis right retrocrural node evident, stable since 07/24/2018. There is no axillary lymphadenopathy. Lungs/Pleura: There is new diffuse patchy interstitial and ground-glass alveolar opacity in the lungs bilaterally. The ground-glass disease has a central predominance in the upper lobes it is more peripherally predominant in the lower lungs bilaterally. No associated pleural effusion. Musculoskeletal: No worrisome lytic or sclerotic osseous abnormality. Review of the MIP images confirms the above findings. CT ABDOMEN and PELVIS FINDINGS Hepatobiliary: No suspicious focal abnormality within the liver parenchyma. There is no evidence for gallstones, gallbladder wall thickening, or pericholecystic fluid. No intrahepatic or extrahepatic biliary dilation. Pancreas: No focal mass lesion. No dilatation of the main duct. No intraparenchymal cyst. No peripancreatic edema. Spleen: No splenomegaly. No focal mass lesion. Adrenals/Urinary Tract: No adrenal nodule or mass. Kidneys unremarkable. Specifically, no evidence for urinary stone disease. No segmental edema in either kidney to suggest pyelonephritis. No evidence for hydroureter. The urinary bladder appears normal for the degree of distention. Stomach/Bowel: Stomach is unremarkable. No gastric wall thickening. No evidence of outlet obstruction. Duodenum is normally positioned as is the ligament of Treitz. No small bowel  wall thickening. No small bowel dilatation. The terminal ileum is normal. The appendix is normal. No gross colonic mass. No colonic wall thickening. Vascular/Lymphatic: There is abdominal aortic atherosclerosis without aneurysm. There is no gastrohepatic or hepatoduodenal ligament lymphadenopathy. No retroperitoneal or mesenteric lymphadenopathy. No pelvic sidewall lymphadenopathy. 12 mm short axis left groin lymph node is upper normal to borderline increased, but stable since 07/24/2018 suggesting benign reactive etiology. Reproductive: The uterus is unremarkable.  There is no adnexal mass. Other: No intraperitoneal free fluid. Musculoskeletal: No worrisome lytic or sclerotic osseous abnormality. Review of the MIP images confirms the above findings. IMPRESSION: 1. No CT evidence for acute pulmonary embolus. 2. Interval development of diffuse patchy interstitial and ground-glass alveolar opacity in the lungs bilaterally since lung cancer screening CT of 07/24/2018. Imaging features are nonspecific and may be related to multifocal infectious/inflammatory etiology although pulmonary edema or hemorrhage could have this  appearance. 3. Borderline mediastinal and left hilar lymphadenopathy, potentially reactive. Follow-up CT chest in 3 months recommended to ensure resolution. 4. No acute findings in the abdomen/pelvis. Specifically, no findings to suggest pyelonephritis. No urinary stone disease. 5. Aortic Atherosclerosis (ICD10-I70.0). Electronically Signed   By: Kennith Center M.D.   On: 02/05/2020 07:54   DG Chest Portable 1 View  Result Date: 02/05/2020 CLINICAL DATA:  Fever.  Cough. EXAM: PORTABLE CHEST 1 VIEW COMPARISON:  CT 05/12/2019. FINDINGS: Mediastinum hilar structures are unremarkable. Heart size stable. Diffuse bilateral pulmonary infiltrates/edema. Small bilateral pleural effusions cannot be completely excluded. No pneumothorax. IMPRESSION: Diffuse bilateral pulmonary infiltrates/edema. Small bilateral  pleural effusions cannot be completely excluded. Electronically Signed   By: Maisie Fus  Register   On: 02/05/2020 05:27    Scheduled Meds: . albuterol  2 puff Inhalation Q6H  . vitamin C  500 mg Oral Daily  . aspirin  81 mg Oral Daily  . atorvastatin  40 mg Oral Daily  . baclofen  20 mg Oral TID  . clobetasol ointment   Topical BID  . dexamethasone (DECADRON) injection  6 mg Intravenous Q24H  . dorzolamide-timolol  1 drop Both Eyes BID  . enoxaparin (LOVENOX) injection  40 mg Subcutaneous Q24H  . folic acid  1 mg Oral Daily  . gabapentin  300 mg Oral TID  . insulin aspart  0-15 Units Subcutaneous TID WC  . insulin detemir  0.15 Units/kg Subcutaneous BID  . losartan  12.5 mg Oral Daily  . sodium chloride flush  3 mL Intravenous Q12H  . triamcinolone ointment   Topical BID  . umeclidinium-vilanterol  1 puff Inhalation Daily  . [START ON 02/10/2020] Vitamin D (Ergocalciferol)  50,000 Units Oral Weekly  . zinc sulfate  220 mg Oral Daily   Continuous Infusions: . sodium chloride 250 mL (02/06/20 0900)  . remdesivir 100 mg in NS 100 mL 100 mg (02/06/20 0902)    Assessment/Plan:  1. Acute hypoxic respiratory failure secondary to COVID-19 pneumonia.  Patient was on 6 L of oxygen when I saw her I dialed her down to 4 L with good saturations.  Can potentially taper down even further.  This is day 2 of remdesivir.  Continue IV steroids and inhalers.  Patient on vitamin C and zinc. 2. Type 2 diabetes mellitus with hyperlipidemia.  Patient on Levemir and sliding scale. 3. Relative hypotension.  Hold losartan. 4. Diabetic neuropathy on gabapentin 5. Weakness we will get physical therapy evaluation     Code Status:     Code Status Orders  (From admission, onward)         Start     Ordered   02/05/20 0829  Full code  Continuous        02/05/20 0832        Code Status History    This patient has a current code status but no historical code status.   Advance Care Planning Activity      Family Communication: Spoke with the patient's daughter on the phone Disposition Plan: Status is: Inpatient  Dispo: The patient is from: Home              Anticipated d/c is to: Home              Anticipated d/c date is: Potentially 02/09/2020 (currently on day 2 of remdesivir)              Patient currently being treated for COVID-19 pneumonia and acute hypoxic respiratory failure  secondary to COVID-19.  Still on oxygen at this point.  Hoping to get off oxygen prior to disposition  Time spent: 28 minutes  Fedor Kaz[employer]

## 2020-02-06 NOTE — Evaluation (Signed)
Physical Therapy Evaluation Patient Details Name: Julie Wall MRN: [id number] DOB: 1955/10/26 Today's Date: 02/06/2020   History of Present Illness  64 y.o. female with medical history significant for COPD.  Patient presented to the ER for evaluation of fever with a T-max of 102F associated with a cough/congestion which she has had for a week and left sided flank pain which is described as a sharp, intermittent pain and urinary frequency.  She was advised by her primary care provider to go to the ER for evaluation of fever.  She has had associated nausea along with hypoxia.  Found to be Covid +  Clinical Impression  Pt pleasant and willing to work with PT.  Initially she was confident that she would be able to ambulate w/o AD like she does at baseline, but she had definite unsteadiness and needed UE use to maintain balance t/o most of upright tasks.  She ultimately was able to ambulate ~75 ft (mostly with walker, ~20 ft w/o AD/UEs with very slow, guarded and unsteady gait).  Pt's O2 was in the mid 90s on arrival at rest on 4L, dropped into the mid 80s quickly with any activity, increased to 6L during ambulation and she was generally able to maintain 86-92% t/o ambulation.  Pt is not at her baseline, but reports that son and daughter live close and will be able to help her with transition home. Overall she did well but is not at all near her baseline, recommending HHPT and RW.  O2 needs per further hospital course.     Follow Up Recommendations Home health PT;Supervision - Intermittent    Equipment Recommendations  Rolling walker with 5" wheels    Recommendations for Other Services       Precautions / Restrictions Precautions Precautions: Fall Restrictions Weight Bearing Restrictions: No      Mobility  Bed Mobility Overal bed mobility: Modified Independent             General bed mobility comments: Pt was able to get to sitting EOB w/o assist  Transfers Overall transfer level:  Modified independent Equipment used: Rolling walker (2 wheeled)             General transfer comment: Pt was able to rise to standing w/o assist, some unsteadiness w/o AD, much safer on second attempt with walker  Ambulation/Gait Ambulation/Gait assistance: Min guard Gait Distance (Feet): 75 Feet Assistive device: Rolling walker (2 wheeled);1 person hand held assist       General Gait Details: Pt initially confient that she could ambulate w/o walker (like baseline), however in taking 2 steps she leaned staggered and needed to sit back down and try again with walker.  Pt much more steady with walker and was able to maintain a relatively consistent cadence and had no LOBs.  Did final ~25 ft w/o AD but much slower and more guarded cadence near hallway rail.  Pt clearly less comfortable and not at her baseline.  Pt required 6L O2 during ambulation to maintain sats in the low 90s to high 80s (did not maintain with 4L)  Stairs            Wheelchair Mobility    Modified Rankin (Stroke Patients Only)       Balance Overall balance assessment: Needs assistance Sitting-balance support: No upper extremity supported Sitting balance-Leahy Scale: Good     Standing balance support: Bilateral upper extremity supported Standing balance-Leahy Scale: Fair Standing balance comment: pt with LOBs trying to ambulate w/o AD, no  LOBs with walker during ambulation                             Pertinent Vitals/Pain Pain Assessment: 0-10 Pain Score: 3  Pain Location: chest pain    Home Living Family/patient expects to be discharged to:: Private residence Living Arrangements: Alone Available Help at Discharge: Family Type of Home: Apartment Home Access: Level entry       Home Equipment: Cane - single point      Prior Function Level of Independence: Independent         Comments: Pt does not drive but will go shopping with daughter and is able to do limited in community  ambulation     Hand Dominance        Extremity/Trunk Assessment   Upper Extremity Assessment Upper Extremity Assessment: Overall WFL for tasks assessed;Generalized weakness    Lower Extremity Assessment Lower Extremity Assessment: Overall WFL for tasks assessed       Communication   Communication: No difficulties  Cognition Arousal/Alertness: Awake/alert Behavior During Therapy: WFL for tasks assessed/performed Overall Cognitive Status: Within Functional Limits for tasks assessed                                        General Comments      Exercises     Assessment/Plan    PT Assessment Patient needs continued PT services  PT Problem List Decreased strength;Decreased range of motion;Decreased activity tolerance;Decreased balance;Decreased mobility;Decreased knowledge of use of DME;Decreased safety awareness;Cardiopulmonary status limiting activity;Decreased coordination       PT Treatment Interventions DME instruction;Gait training;Functional mobility training;Therapeutic activities;Therapeutic exercise;Neuromuscular re-education;Balance training;Patient/family education    PT Goals (Current goals can be found in the Care Plan section)  Acute Rehab PT Goals Patient Stated Goal: go home PT Goal Formulation: With patient Time For Goal Achievement: 02/20/20 Potential to Achieve Goals: Good    Frequency Min 2X/week   Barriers to discharge        Co-evaluation               AM-PAC PT "6 Clicks" Mobility  Outcome Measure Help needed turning from your back to your side while in a flat bed without using bedrails?: None Help needed moving from lying on your back to sitting on the side of a flat bed without using bedrails?: None Help needed moving to and from a bed to a chair (including a wheelchair)?: A Little Help needed standing up from a chair using your arms (e.g., wheelchair or bedside chair)?: A Little Help needed to walk in hospital  room?: A Little Help needed climbing 3-5 steps with a railing? : A Little 6 Click Score: 20    End of Session Equipment Utilized During Treatment: Oxygen Activity Tolerance: Patient limited by fatigue Patient left: with chair alarm set;with call bell/phone within reach   PT Visit Diagnosis: Muscle weakness (generalized) (M62.81);Difficulty in walking, not elsewhere classified (R26.2);Unsteadiness on feet (R26.81)    Time: 5809-9833 PT Time Calculation (min) (ACUTE ONLY): 32 min   Charges:   PT Evaluation $PT Eval Low Complexity: 1 Low PT Treatments $Gait Training: 8-22 mins        Malachi Pro, DPT 02/06/2020, 5:23 PM

## 2020-02-07 LAB — COMPREHENSIVE METABOLIC PANEL
ALT: 17 U/L (ref 0–44)
AST: 44 U/L — ABNORMAL HIGH (ref 15–41)
Albumin: 2.9 g/dL — ABNORMAL LOW (ref 3.5–5.0)
Alkaline Phosphatase: 60 U/L (ref 38–126)
Anion gap: 8 (ref 5–15)
BUN: 22 mg/dL (ref 8–23)
CO2: 23 mmol/L (ref 22–32)
Calcium: 8.6 mg/dL — ABNORMAL LOW (ref 8.9–10.3)
Chloride: 109 mmol/L (ref 98–111)
Creatinine, Ser: 0.53 mg/dL (ref 0.44–1.00)
GFR calc Af Amer: >60 mL/min (ref >60)
GFR calc non Af Amer: >60 mL/min (ref >60)
Glucose, Bld: 106 mg/dL — ABNORMAL HIGH (ref 70–99)
Potassium: 4 mmol/L (ref 3.5–5.1)
Sodium: 140 mmol/L (ref 135–145)
Total Bilirubin: 0.7 mg/dL (ref 0.3–1.2)
Total Protein: 7 g/dL (ref 6.5–8.1)

## 2020-02-07 LAB — CBC WITH DIFFERENTIAL/PLATELET
Abs Immature Granulocytes: 0.09 10*3/uL — ABNORMAL HIGH (ref 0.00–0.07)
Basophils Absolute: 0 10*3/uL (ref 0.0–0.1)
Basophils Relative: 1 %
Eosinophils Absolute: 0 10*3/uL (ref 0.0–0.5)
Eosinophils Relative: 0 %
HCT: 39.1 % (ref 36.0–46.0)
Hemoglobin: 13.9 g/dL (ref 12.0–15.0)
Immature Granulocytes: 1 %
Lymphocytes Relative: 18 %
Lymphs Abs: 1.3 10*3/uL (ref 0.7–4.0)
MCH: 31.8 pg (ref 26.0–34.0)
MCHC: 35.5 g/dL (ref 30.0–36.0)
MCV: 89.5 fL (ref 80.0–100.0)
Monocytes Absolute: 0.3 10*3/uL (ref 0.1–1.0)
Monocytes Relative: 4 %
Neutro Abs: 5.5 10*3/uL (ref 1.7–7.7)
Neutrophils Relative %: 76 %
Platelets: 203 10*3/uL (ref 150–400)
RBC: 4.37 MIL/uL (ref 3.87–5.11)
RDW: 14.3 % (ref 11.5–15.5)
Smear Review: NORMAL
WBC: 7.3 10*3/uL (ref 4.0–10.5)
nRBC: 0 % (ref 0.0–0.2)

## 2020-02-07 LAB — PHOSPHORUS: Phosphorus: 3 mg/dL (ref 2.5–4.6)

## 2020-02-07 LAB — MAGNESIUM: Magnesium: 2 mg/dL (ref 1.7–2.4)

## 2020-02-07 LAB — GLUCOSE, CAPILLARY
Glucose-Capillary: 93 mg/dL (ref 70–99)
Glucose-Capillary: 180 mg/dL — ABNORMAL HIGH (ref 70–99)
Glucose-Capillary: 238 mg/dL — ABNORMAL HIGH (ref 70–99)
Glucose-Capillary: 224 mg/dL — ABNORMAL HIGH (ref 70–99)

## 2020-02-07 LAB — FERRITIN: Ferritin: 1098 ng/mL — ABNORMAL HIGH (ref 11–307)

## 2020-02-07 LAB — C-REACTIVE PROTEIN: CRP: 8.2 mg/dL — ABNORMAL HIGH (ref <1.0)

## 2020-02-07 LAB — FIBRIN DERIVATIVES D-DIMER (ARMC ONLY): Fibrin derivatives D-dimer (ARMC): 891.2 ng/mL (FEU) — ABNORMAL HIGH (ref 0.00–499.00)

## 2020-02-07 MED ORDER — BARICITINIB 2 MG PO TABS
2.0000 mg | ORAL_TABLET | Freq: Every day | ORAL | Status: DC
  Administered 2020-02-07 – 2020-02-08 (×2): 2 mg via ORAL
  Filled 2020-02-07 (×2): qty 1

## 2020-02-07 MED ORDER — METHYLPREDNISOLONE SODIUM SUCC 40 MG IJ SOLR
40.0000 mg | Freq: Two times a day (BID) | INTRAMUSCULAR | Status: DC
  Administered 2020-02-07 – 2020-02-11 (×8): 40 mg via INTRAVENOUS
  Filled 2020-02-07 (×8): qty 1

## 2020-02-07 NOTE — Progress Notes (Signed)
Patient ID: Julie Wall, female   DOB: 07-Feb-1956, 64 y.o.   MRN: [id number] [facility] PROGRESS NOTE  Julie Wall YPP:[id number] DOB: 10-Jan-1956 DOA: 02/05/2020 PCP: Alba Cory, MD  HPI/Subjective: Patient still coughing a lot.  Still with some shortness of breath.  Still requiring 4 L of oxygen.  Still not feeling great.  Feels a little bit better than when he came in.  Came in with shortness of breath and found to have COVID-19 pneumonia and acute hypoxic respiratory failure.  Objective: Vitals:   02/07/20 0536 02/07/20 0724  BP: 101/63 95/67  Pulse: 84 82  Resp: 18 18  Temp: 99.2 F (37.3 C) 98.6 F (37 C)  SpO2:  91%    Intake/Output Summary (Last 24 hours) at 02/07/2020 1159 Last data filed at 02/06/2020 1700 Gross per 24 hour  Intake 122.17 ml  Output --  Net 122.17 ml   Filed Weights   02/04/20 2019  Weight: 72.6 kg    ROS: Review of Systems  Respiratory: Positive for cough and shortness of breath.   Cardiovascular: Negative for chest pain.  Gastrointestinal: Negative for abdominal pain, nausea and vomiting.   Exam: Physical Exam HENT:     Nose: No mucosal edema.     Mouth/Throat:     Pharynx: No oropharyngeal exudate.  Eyes:     General: Lids are normal.     Conjunctiva/sclera: Conjunctivae normal.  Cardiovascular:     Rate and Rhythm: Normal rate and regular rhythm.     Heart sounds: Normal heart sounds, S1 normal and S2 normal.  Pulmonary:     Breath sounds: Examination of the right-middle field reveals decreased breath sounds and rhonchi. Examination of the left-middle field reveals decreased breath sounds and rhonchi. Examination of the right-lower field reveals decreased breath sounds and rhonchi. Examination of the left-lower field reveals decreased breath sounds and rhonchi. Decreased breath sounds and rhonchi present.  Abdominal:     Palpations: Abdomen is soft.     Tenderness: There is no abdominal tenderness.  Musculoskeletal:     Right  ankle: No swelling.     Left ankle: No swelling.  Skin:    General: Skin is warm.     Comments: Vitiligo lower extremities  Neurological:     Mental Status: She is alert and oriented to person, place, and time.       Data Reviewed: Basic Metabolic Panel: Recent Labs  Lab 02/04/20 2025 02/06/20 0453 02/07/20 0544  NA 134* 137 140  K 4.1 4.3 4.0  CL 102 108 109  CO2 19* 20* 23  GLUCOSE 135* 92 106*  BUN 22 21 22   CREATININE 0.85 0.61 0.53  CALCIUM 8.5* 8.3* 8.6*  MG  --  2.1 2.0  PHOS  --  2.7 3.0   Liver Function Tests: Recent Labs  Lab 02/04/20 2025 02/06/20 0453 02/07/20 0544  AST 42* 36 44*  ALT 18 15 17   ALKPHOS 55 52 60  BILITOT 1.4* 1.0 0.7  PROT 8.0 6.6 7.0  ALBUMIN 3.7 2.9* 2.9*   CBC: Recent Labs  Lab 02/04/20 2025 02/06/20 0453 02/07/20 0544  WBC 4.7 7.1 7.3  NEUTROABS  --  5.5 5.5  HGB 15.1* 13.1 13.9  HCT 44.1 38.8 39.1  MCV 92.5 94.2 89.5  PLT 133* 156 203  BNP (last 3 results) Recent Labs    02/05/20 0707  BNP 12.7    CBG: Recent Labs  Lab 02/06/20 0730 02/06/20 1151 02/06/20 1613 02/07/20 0722 02/07/20 1151  GLUCAP 85 123* 190* 93 180*    Recent Results (from the past 240 hour(s))  CULTURE, URINE COMPREHENSIVE     Status: None   Collection Time: 02/04/20  1:52 PM   Specimen: Urine  Result Value Ref Range Status   MICRO NUMBER: [id number]  Final   SPECIMEN QUALITY: Adequate  Final   Source OTHER (SPECIFY)  Final   STATUS: FINAL  Final   RESULT: No Growth  Final  Blood culture (routine x 2)     Status: None (Preliminary result)   Collection Time: 02/04/20  8:25 PM   Specimen: BLOOD  Result Value Ref Range Status   Specimen Description BLOOD LEFT AC  Final   Special Requests   Final    BOTTLES DRAWN AEROBIC AND ANAEROBIC Blood Culture adequate volume   Culture   Final    NO GROWTH 3 DAYS Performed at [facility], [address] [place], [place] [address]    Report Status PENDING  Incomplete  Urine  Culture     Status: None   Collection Time: 02/04/20  8:48 PM   Specimen: Urine, Clean Catch  Result Value Ref Range Status   Specimen Description   Final    URINE, CLEAN CATCH Performed at [facility], [address], [place], [place] [address]    Special Requests NONE  Final   Culture   Final    NO GROWTH Performed at [facility], [address] [place], [place] [address]    Report Status 02/06/2020 FINAL  Final  SARS Coronavirus 2 by RT PCR (hospital order, performed in [facility] hospital lab) Nasopharyngeal Nasopharyngeal Swab     Status: Abnormal   Collection Time: 02/05/20  4:59 AM   Specimen: Nasopharyngeal Swab  Result Value Ref Range Status   SARS Coronavirus 2 POSITIVE (A) NEGATIVE Final    Comment: RESULT CALLED TO, READ BACK BY AND VERIFIED WITH: ASHTON PETERS AT 0632 ON 02/05/2020 [facility]. (NOTE) SARS-CoV-2 target nucleic acids are DETECTED  SARS-CoV-2 RNA is generally detectable in upper respiratory specimens  during the acute phase of infection.  Positive results are indicative  of the presence of the identified virus, but do not rule out bacterial infection or co-infection with other pathogens not detected by the test.  Clinical correlation with patient history and  other diagnostic information is necessary to determine patient infection status.  The expected result is negative.  Fact Sheet for Patients:   BoilerBrush.com.cy   Fact Sheet for Healthcare Providers:   https://pope.com/    This test is not yet approved or cleared by the [place] FDA and  has been authorized for detection and/or diagnosis of SARS-CoV-2 by FDA under an Emergency Use Authorization (EUA).  This EUA will remain in effect (meaning thi s test can be used) for the duration of  the COVID-19 declaration under Section 564(b)(1) of the Act, 21 U.S.C. section 360-bbb-3(b)(1), unless the authorization is terminated or revoked  sooner.  Performed at [facility], [address] [place], [place] [address]   Culture, blood (Routine X 2) w Reflex to ID Panel     Status: None (Preliminary result)   Collection Time: 02/05/20  5:09 AM   Specimen: BLOOD  Result Value Ref Range Status   Specimen Description BLOOD LEFT ANTECUBITAL  Final   Special Requests   Final    BOTTLES DRAWN AEROBIC AND ANAEROBIC Blood Culture adequate volume   Culture   Final    NO  GROWTH 2 DAYS Performed at [facility], [address] [place], [place] [address]    Report Status PENDING  Incomplete      Scheduled Meds: . albuterol  2 puff Inhalation Q6H  . vitamin C  500 mg Oral Daily  . aspirin  81 mg Oral Daily  . atorvastatin  40 mg Oral Daily  . baclofen  20 mg Oral TID  . baricitinib  2 mg Oral Daily  . clobetasol ointment   Topical BID  . dorzolamide-timolol  1 drop Both Eyes BID  . enoxaparin (LOVENOX) injection  40 mg Subcutaneous Q24H  . folic acid  1 mg Oral Daily  . gabapentin  300 mg Oral TID  . insulin aspart  0-15 Units Subcutaneous TID WC  . insulin detemir  0.15 Units/kg Subcutaneous Daily  . methylPREDNISolone (SOLU-MEDROL) injection  40 mg Intravenous Q12H  . sodium chloride flush  3 mL Intravenous Q12H  . triamcinolone ointment   Topical BID  . umeclidinium-vilanterol  1 puff Inhalation Daily  . [START ON 02/10/2020] Vitamin D (Ergocalciferol)  50,000 Units Oral Weekly  . zinc sulfate  220 mg Oral Daily   Continuous Infusions: . sodium chloride Stopped (02/06/20 1143)  . remdesivir 100 mg in NS 100 mL 100 mg (02/07/20 0923)    Assessment/Plan:  1. Acute hypoxic respiratory failure secondary to COVID-19 pneumonia.  Patient on 4 L of oxygen with borderline saturations.  Continue day 3 of remdesivir today.  Change Decadron over to Solu-Medrol twice daily.  Continue inhalers.  Case discussed with daughter to start baricitinib secondary to increased CRP and continued oxygen requirements.   Patient's daughter was explained benefits and risks of starting the medication she agreed to anything that would could potentially help. 2. Type 2 diabetes mellitus with hyperlipidemia.  With increasing steroids to twice daily dosing continue to watch sugars.  Continue Levemir and sliding scale. 3. Relative hypotension.  Continue to hold losartan 4. Diabetic neuropathy on gabapentin 5. Weakness.  Did well with physical therapy and recommended home with home health.     Code Status:     Code Status Orders  (From admission, onward)         Start     Ordered   02/05/20 0829  Full code  Continuous        02/05/20 0832        Code Status History    This patient has a current code status but no historical code status.   Advance Care Planning Activity     Family Communication: Spoke with the patient's daughter on the phone Disposition Plan: Status is: Inpatient  Dispo: The patient is from: Home              Anticipated d/c is to: Home with home health              Anticipated d/c date is: Day 3 of 5 of remdesivir.  Patient still on 4 L of oxygen.  Hoping to get off oxygen prior to disposition              Patient currently being treated for acute hypoxic respiratory failure and also COVID-19 pneumonia.  Time spent: 27 minutes  [employer]

## 2020-02-08 ENCOUNTER — Inpatient Hospital Stay: Payer: Medicare Other

## 2020-02-08 LAB — CBC WITH DIFFERENTIAL/PLATELET
Abs Immature Granulocytes: 0.1 10*3/uL — ABNORMAL HIGH (ref 0.00–0.07)
Basophils Absolute: 0 10*3/uL (ref 0.0–0.1)
Basophils Relative: 0 %
Eosinophils Absolute: 0 10*3/uL (ref 0.0–0.5)
Eosinophils Relative: 0 %
HCT: 41 % (ref 36.0–46.0)
Hemoglobin: 14.6 g/dL (ref 12.0–15.0)
Immature Granulocytes: 1 %
Lymphocytes Relative: 15 %
Lymphs Abs: 2 10*3/uL (ref 0.7–4.0)
MCH: 31.9 pg (ref 26.0–34.0)
MCHC: 35.6 g/dL (ref 30.0–36.0)
MCV: 89.5 fL (ref 80.0–100.0)
Monocytes Absolute: 0.4 10*3/uL (ref 0.1–1.0)
Monocytes Relative: 3 %
Neutro Abs: 11.1 10*3/uL — ABNORMAL HIGH (ref 1.7–7.7)
Neutrophils Relative %: 81 %
Platelets: 251 10*3/uL (ref 150–400)
RBC: 4.58 MIL/uL (ref 3.87–5.11)
RDW: 14.4 % (ref 11.5–15.5)
WBC: 13.5 10*3/uL — ABNORMAL HIGH (ref 4.0–10.5)
nRBC: 0 % (ref 0.0–0.2)

## 2020-02-08 LAB — MAGNESIUM: Magnesium: 1.9 mg/dL (ref 1.7–2.4)

## 2020-02-08 LAB — COMPREHENSIVE METABOLIC PANEL
ALT: 20 U/L (ref 0–44)
AST: 44 U/L — ABNORMAL HIGH (ref 15–41)
Albumin: 3 g/dL — ABNORMAL LOW (ref 3.5–5.0)
Alkaline Phosphatase: 75 U/L (ref 38–126)
Anion gap: 10 (ref 5–15)
BUN: 19 mg/dL (ref 8–23)
CO2: 22 mmol/L (ref 22–32)
Calcium: 8.8 mg/dL — ABNORMAL LOW (ref 8.9–10.3)
Chloride: 108 mmol/L (ref 98–111)
Creatinine, Ser: 0.45 mg/dL (ref 0.44–1.00)
GFR calc Af Amer: >60 mL/min (ref >60)
GFR calc non Af Amer: >60 mL/min (ref >60)
Glucose, Bld: 217 mg/dL — ABNORMAL HIGH (ref 70–99)
Potassium: 4 mmol/L (ref 3.5–5.1)
Sodium: 140 mmol/L (ref 135–145)
Total Bilirubin: 0.8 mg/dL (ref 0.3–1.2)
Total Protein: 7.3 g/dL (ref 6.5–8.1)

## 2020-02-08 LAB — PHOSPHORUS: Phosphorus: 3.1 mg/dL (ref 2.5–4.6)

## 2020-02-08 LAB — GLUCOSE, CAPILLARY
Glucose-Capillary: 229 mg/dL — ABNORMAL HIGH (ref 70–99)
Glucose-Capillary: 153 mg/dL — ABNORMAL HIGH (ref 70–99)
Glucose-Capillary: 275 mg/dL — ABNORMAL HIGH (ref 70–99)
Glucose-Capillary: 224 mg/dL — ABNORMAL HIGH (ref 70–99)

## 2020-02-08 LAB — FIBRIN DERIVATIVES D-DIMER (ARMC ONLY): Fibrin derivatives D-dimer (ARMC): 951.65 ng/mL (FEU) — ABNORMAL HIGH (ref 0.00–499.00)

## 2020-02-08 LAB — FERRITIN: Ferritin: 1096 ng/mL — ABNORMAL HIGH (ref 11–307)

## 2020-02-08 LAB — C-REACTIVE PROTEIN: CRP: 9.3 mg/dL — ABNORMAL HIGH (ref <1.0)

## 2020-02-08 MED ORDER — BARICITINIB 2 MG PO TABS
4.0000 mg | ORAL_TABLET | Freq: Every day | ORAL | Status: DC
  Administered 2020-02-09 – 2020-02-11 (×3): 4 mg via ORAL
  Filled 2020-02-08 (×3): qty 2

## 2020-02-08 NOTE — TOC Initial Note (Signed)
Transition of Care [facility]) - Initial/Assessment Note    Patient Details  Name: Julie Wall MRN: [id number] Date of Birth: 1956/01/08  Transition of [facility]) CM/SW Contact:    Allayne Butcher, RN Phone Number: 02/08/2020, 9:10 AM  Clinical Narrative:                 Patient admitted to the hospital with COVID on acute O2 at 3L.  RNCM was able to reach patient via phone and discuss discharge planning.  Patient reports that she lives alone in [place] and is independent in ADL's.  Patient has a walker at home but has never needed to use it.  Patient does not drive, her daughter Julie Wall provides transportation.  Patient is current with her PCP and [facility].  PT has recommended home heath PT at discharge, patient accepts home health and does not have a preference in agency.  Kandee Keen with [facility] has accepted referral for home health PT and OT.  Patient would like for RNCM to call her daughter Julie Wall, attempted to call, no answer and voice mailbox is full, will attempt to call family again later today.    Expected Discharge Plan: Home w Home Health Services Barriers to Discharge: Continued Medical Work up   Patient Goals and CMS Choice Patient states their goals for this hospitalization and ongoing recovery are:: Hopefully can get off the oxygen before going home CMS Medicare.gov Compare Post Acute Care list provided to:: Patient Choice offered to / list presented to : Patient  Expected Discharge Plan and Services Expected Discharge Plan: Home w Home Health Services   Discharge Planning Services: CM Consult Post Acute Care Choice: Home Health Living arrangements for the past 2 months: Apartment                           HH Arranged: OT, PT HH Agency: [facility] Date [facility] Agency Contacted: 02/08/20 Time HH Agency Contacted: [phone] Representative spoke with at [facility] Agency: Kandee Keen  Prior Living Arrangements/Services Living arrangements for the  past 2 months: Apartment Lives with:: Self Patient language and need for interpreter reviewed:: Yes Do you feel safe going back to the place where you live?: Yes      Need for Family Participation in Patient Care: Yes (Comment) (COVID) Care giver support system in place?: Yes (comment) (Daughters) Current home services: DME (walker) Criminal Activity/Legal Involvement Pertinent to Current Situation/Hospitalization: No - Comment as needed  Activities of Daily Living Home Assistive Devices/Equipment: None ADL Screening (condition at time of admission) Patient's cognitive ability adequate to safely complete daily activities?: Yes Is the patient deaf or have difficulty hearing?: No Does the patient have difficulty seeing, even when wearing glasses/contacts?: No Does the patient have difficulty concentrating, remembering, or making decisions?: No Patient able to express need for assistance with ADLs?: Yes Does the patient have difficulty dressing or bathing?: No Independently performs ADLs?: Yes (appropriate for developmental age) Does the patient have difficulty walking or climbing stairs?: No Weakness of Legs: None Weakness of Arms/Hands: None  Permission Sought/Granted Permission sought to share information with : Case Manager, Family Supports, Other (comment) Permission granted to share information with : Yes, Verbal Permission Granted  Share Information with NAME: Julie Wall  Permission granted to share info w AGENCY: [facility]  Permission granted to share info w Relationship: daughter     Emotional Assessment   Attitude/Demeanor/Rapport: Engaged Affect (typically observed): Accepting Orientation: : Oriented  to Self, Oriented to Place, Oriented to  Time, Oriented to Situation Alcohol / Substance Use: Not Applicable Psych Involvement: No (comment)  Admission diagnosis:  Hypoxia [R09.02] Acute respiratory failure with hypoxemia (HCC) [J96.01] Hematuria, unspecified type  [R31.9] Pneumonia due to COVID-19 virus [U07.1, J12.82] Lower respiratory tract infection due to COVID-19 virus [U07.1, J22] COVID-19 [U07.1] Patient Active Problem List   Diagnosis Date Noted  . Hypotension   . Diabetic polyneuropathy associated with type 2 diabetes mellitus (HCC)   . Weakness   . Pneumonia due to COVID-19 virus 02/05/2020  . Hilar lymphadenopathy 02/05/2020  . Morbid obesity (HCC) 06/26/2018  . Atherosclerosis of aorta (HCC) 05/04/2017  . Pap smear abnormality of cervix with ASCUS favoring benign 04/23/2017  . Vitamin D deficiency 12/14/2016  . Obesity (BMI 30.0-34.9) 12/13/2015  . Chronic bronchitis (HCC) 05/14/2015  . Diabetes mellitus (HCC) 05/14/2015  . Positive H. pylori test 04/05/2015  . Rapid urease test for Helicobacter pylori infection positive 04/05/2015  . Umbilical hernia 04/01/2015  . Exomphalos 04/01/2015  . RLS (restless legs syndrome) 01/08/2015  . Type 2 diabetes mellitus with hyperlipidemia (HCC) 01/08/2015  . Neurogenic claudication 01/08/2015  . AD (atopic dermatitis) 12/28/2014  . Chronic LBP 12/28/2014  . Dyslipidemia 12/28/2014  . Gastroesophageal reflux disease without esophagitis 12/28/2014  . Microalbuminuria 12/28/2014  . Disorder of bone and cartilage 12/28/2014  . Neuralgia of left thigh 12/28/2014  . Perennial allergic rhinitis with seasonal variation 12/28/2014  . Tobacco abuse 12/28/2014  . Mononeuropathy of left lower extremity 12/28/2014  . Hypothyroidism 09/22/2005  . Hypertension, benign 09/22/2005   PCP:  Alba Cory, MD Pharmacy:   [facility] [address] [place], [place] - [address] [place] [address] Phone: [phone] Fax: [phone]     Social Determinants of Health (SDOH) Interventions    Readmission Risk Interventions No flowsheet data found.

## 2020-02-08 NOTE — Progress Notes (Signed)
Patient ID: Julie Wall, female   DOB: [birth year], 64 y.o.   MRN: [id number] [facility] PROGRESS NOTE  Eudelia Hiltunen WFU:[id number] DOB: April 19, 1956 DOA: 02/05/2020 PCP: Alba Cory, MD  HPI/Subjective: Patient seen this morning sitting up in the chair.  She was on 5 L. I tried to Dialed down to 4 L but kept on desaturating.  She does have some cough and shortness of breath.  Admitted with COVID-19 pneumonia and acute hypoxic respiratory failure.  Objective: Vitals:   02/08/20 1247 02/08/20 1546  BP: 110/70 109/63  Pulse: 87 86  Resp: (!) 23 20  Temp: 98.2 F (36.8 C) 98.3 F (36.8 C)  SpO2: 92% 93%    Intake/Output Summary (Last 24 hours) at 02/08/2020 1618 Last data filed at 02/07/2020 2040 Gross per 24 hour  Intake 240 ml  Output --  Net 240 ml   Filed Weights   02/04/20 2019  Weight: 72.6 kg    ROS: Review of Systems  Respiratory: Positive for cough and shortness of breath.   Cardiovascular: Negative for chest pain.  Gastrointestinal: Negative for abdominal pain, nausea and vomiting.   Exam: Physical Exam HENT:     Nose: No mucosal edema.     Mouth/Throat:     Pharynx: No oropharyngeal exudate.  Eyes:     General: Lids are normal.     Conjunctiva/sclera: Conjunctivae normal.  Cardiovascular:     Rate and Rhythm: Normal rate and regular rhythm.     Heart sounds: Normal heart sounds, S1 normal and S2 normal.  Pulmonary:     Breath sounds: Examination of the right-lower field reveals decreased breath sounds and rhonchi. Examination of the left-lower field reveals decreased breath sounds and rhonchi. Decreased breath sounds and rhonchi present. No wheezing or rales.  Abdominal:     Palpations: Abdomen is soft.     Tenderness: There is no abdominal tenderness.  Musculoskeletal:     Right ankle: No swelling.     Left ankle: No swelling.  Skin:    General: Skin is warm.     Findings: No rash.  Neurological:     Mental Status: She is alert and oriented to  person, place, and time.       Data Reviewed: Basic Metabolic Panel: Recent Labs  Lab 02/04/20 2025 02/06/20 0453 02/07/20 0544 02/08/20 0500  NA 134* 137 140 140  K 4.1 4.3 4.0 4.0  CL 102 108 109 108  CO2 19* 20* 23 22  GLUCOSE 135* 92 106* 217*  BUN 22 21 22 19   CREATININE 0.85 0.61 0.53 0.45  CALCIUM 8.5* 8.3* 8.6* 8.8*  MG  --  2.1 2.0 1.9  PHOS  --  2.7 3.0 3.1   Liver Function Tests: Recent Labs  Lab 02/04/20 2025 02/06/20 0453 02/07/20 0544 02/08/20 0500  AST 42* 36 44* 44*  ALT 18 15 17 20   ALKPHOS 55 52 60 75  BILITOT 1.4* 1.0 0.7 0.8  PROT 8.0 6.6 7.0 7.3  ALBUMIN 3.7 2.9* 2.9* 3.0*   CBC: Recent Labs  Lab 02/04/20 2025 02/06/20 0453 02/07/20 0544 02/08/20 0500  WBC 4.7 7.1 7.3 13.5*  NEUTROABS  --  5.5 5.5 11.1*  HGB 15.1* 13.1 13.9 14.6  HCT 44.1 38.8 39.1 41.0  MCV 92.5 94.2 89.5 89.5  PLT 133* 156 203 251   BNP (last 3 results) Recent Labs    02/05/20 0707  BNP 12.7    CBG: Recent Labs  Lab 02/07/20 1705 02/07/20 2117 02/08/20 2118  02/08/20 1217 02/08/20 1544  GLUCAP 238* 224* 229* 153* 275*    Recent Results (from the past 240 hour(s))  CULTURE, URINE COMPREHENSIVE     Status: None   Collection Time: 02/04/20  1:52 PM   Specimen: Urine  Result Value Ref Range Status   MICRO NUMBER: [id number]  Final   SPECIMEN QUALITY: Adequate  Final   Source OTHER (SPECIFY)  Final   STATUS: FINAL  Final   RESULT: No Growth  Final  Blood culture (routine x 2)     Status: None (Preliminary result)   Collection Time: 02/04/20  8:25 PM   Specimen: BLOOD  Result Value Ref Range Status   Specimen Description BLOOD LEFT AC  Final   Special Requests   Final    BOTTLES DRAWN AEROBIC AND ANAEROBIC Blood Culture adequate volume   Culture   Final    NO GROWTH 4 DAYS Performed at [facility], [address] [place], [place] [address]    Report Status PENDING  Incomplete  Urine Culture     Status: None   Collection Time:  02/04/20  8:48 PM   Specimen: Urine, Clean Catch  Result Value Ref Range Status   Specimen Description   Final    URINE, CLEAN CATCH Performed at [facility], [address], [place], [place] [address]    Special Requests NONE  Final   Culture   Final    NO GROWTH Performed at [facility], [address], [place], [place] [address]    Report Status 02/06/2020 FINAL  Final  SARS Coronavirus 2 by RT PCR (hospital order, performed in [facility] hospital lab) Nasopharyngeal Nasopharyngeal Swab     Status: Abnormal   Collection Time: 02/05/20  4:59 AM   Specimen: Nasopharyngeal Swab  Result Value Ref Range Status   SARS Coronavirus 2 POSITIVE (A) NEGATIVE Final    Comment: RESULT CALLED TO, READ BACK BY AND VERIFIED WITH: ASHTON PETERS AT 0632 ON 02/05/2020 [facility]. (NOTE) SARS-CoV-2 target nucleic acids are DETECTED  SARS-CoV-2 RNA is generally detectable in upper respiratory specimens  during the acute phase of infection.  Positive results are indicative  of the presence of the identified virus, but do not rule out bacterial infection or co-infection with other pathogens not detected by the test.  Clinical correlation with patient history and  other diagnostic information is necessary to determine patient infection status.  The expected result is negative.  Fact Sheet for Patients:   BoilerBrush.com.cy   Fact Sheet for Healthcare Providers:   https://pope.com/    This test is not yet approved or cleared by the [place] FDA and  has been authorized for detection and/or diagnosis of SARS-CoV-2 by FDA under an Emergency Use Authorization (EUA).  This EUA will remain in effect (meaning thi s test can be used) for the duration of  the COVID-19 declaration under Section 564(b)(1) of the Act, 21 U.S.C. section 360-bbb-3(b)(1), unless the authorization is terminated or revoked sooner.  Performed at [facility], [address] [place], [place] [address]   Culture, blood (Routine X 2) w Reflex to ID Panel     Status: None (Preliminary result)   Collection Time: 02/05/20  5:09 AM   Specimen: BLOOD  Result Value Ref Range Status   Specimen Description BLOOD LEFT ANTECUBITAL  Final   Special Requests   Final    BOTTLES DRAWN AEROBIC AND ANAEROBIC Blood Culture adequate volume   Culture  Final    NO GROWTH 3 DAYS Performed at [facility], [address] [place], [place] [address]    Report Status PENDING  Incomplete     Scheduled Meds: . albuterol  2 puff Inhalation Q6H  . vitamin C  500 mg Oral Daily  . aspirin  81 mg Oral Daily  . atorvastatin  40 mg Oral Daily  . baclofen  20 mg Oral TID  . [START ON 02/09/2020] baricitinib  4 mg Oral Daily  . clobetasol ointment   Topical BID  . dorzolamide-timolol  1 drop Both Eyes BID  . enoxaparin (LOVENOX) injection  40 mg Subcutaneous Q24H  . folic acid  1 mg Oral Daily  . gabapentin  300 mg Oral TID  . insulin aspart  0-15 Units Subcutaneous TID WC  . insulin detemir  0.15 Units/kg Subcutaneous Daily  . methylPREDNISolone (SOLU-MEDROL) injection  40 mg Intravenous Q12H  . sodium chloride flush  3 mL Intravenous Q12H  . triamcinolone ointment   Topical BID  . umeclidinium-vilanterol  1 puff Inhalation Daily  . [START ON 02/10/2020] Vitamin D (Ergocalciferol)  50,000 Units Oral Weekly  . zinc sulfate  220 mg Oral Daily   Continuous Infusions: . sodium chloride Stopped (02/06/20 1143)  . remdesivir 100 mg in NS 100 mL 100 mg (02/08/20 1003)    Assessment/Plan:  1.  Acute hypoxic respiratory failure secondary to COVID-19 pneumonia.  Patient on 5 L of oxygen this morning with dropping of saturations when I try to dial down.  Continue day 4 of remdesivir.  Continue Solu-Medrol.  Started baricitinib yesterday.  We will repeat a chest x-ray tomorrow.  Get ABG on oxygen tomorrow morning. 2.  Type 2 diabetes mellitus with  hyperlipidemia.  Continue Levemir and sliding scale with steroids.  On atorvastatin. 3.  Relative hypotension continue to hold losartan 4.  Diabetic neuropathy on gabapentin 5.  Weakness.  Physical therapy recommends home with home health     Code Status:     Code Status Orders  (From admission, onward)         Start     Ordered   02/05/20 0829  Full code  Continuous        02/05/20 0832        Code Status History    This patient has a current code status but no historical code status.   Advance Care Planning Activity     Family Communication: Spoke with daughter on the phone Disposition Plan: Status is: Inpatient  Dispo: The patient is from: Home              Anticipated d/c is to: Home with home health              Anticipated d/c date is: We will likely need a few more days here trying to get the oxygen down as much as possible prior to disposition              Patient currently treated for acute hypoxic respiratory failure on 5 L of oxygen.  Continue treating COVID-19 pneumonia with remdesivir baricitinib and steroids  Time spent: 27 minutes  Patryck [employer]

## 2020-02-09 DIAGNOSIS — R590 Localized enlarged lymph nodes: Secondary | ICD-10-CM

## 2020-02-09 LAB — BLOOD GAS, ARTERIAL
Acid-Base Excess: 3.1 mmol/L — ABNORMAL HIGH (ref 0.0–2.0)
Bicarbonate: 25.3 mmol/L (ref 20.0–28.0)
FIO2: 0.4
O2 Saturation: 91.1 %
Patient temperature: 37
pCO2 arterial: 31 mmHg — ABNORMAL LOW (ref 32.0–48.0)
pH, Arterial: 7.52 — ABNORMAL HIGH (ref 7.350–7.450)
pO2, Arterial: 54 mmHg — ABNORMAL LOW (ref 83.0–108.0)

## 2020-02-09 LAB — CBC WITH DIFFERENTIAL/PLATELET
Abs Immature Granulocytes: 0.21 10*3/uL — ABNORMAL HIGH (ref 0.00–0.07)
Basophils Absolute: 0 10*3/uL (ref 0.0–0.1)
Basophils Relative: 0 %
Eosinophils Absolute: 0 10*3/uL (ref 0.0–0.5)
Eosinophils Relative: 0 %
HCT: 37.1 % (ref 36.0–46.0)
Hemoglobin: 12.8 g/dL (ref 12.0–15.0)
Immature Granulocytes: 1 %
Lymphocytes Relative: 14 %
Lymphs Abs: 2.4 10*3/uL (ref 0.7–4.0)
MCH: 31.4 pg (ref 26.0–34.0)
MCHC: 34.5 g/dL (ref 30.0–36.0)
MCV: 90.9 fL (ref 80.0–100.0)
Monocytes Absolute: 0.6 10*3/uL (ref 0.1–1.0)
Monocytes Relative: 4 %
Neutro Abs: 13.7 10*3/uL — ABNORMAL HIGH (ref 1.7–7.7)
Neutrophils Relative %: 81 %
Platelets: 325 10*3/uL (ref 150–400)
RBC: 4.08 MIL/uL (ref 3.87–5.11)
RDW: 14.6 % (ref 11.5–15.5)
WBC: 17 10*3/uL — ABNORMAL HIGH (ref 4.0–10.5)
nRBC: 0 % (ref 0.0–0.2)

## 2020-02-09 LAB — COMPREHENSIVE METABOLIC PANEL
ALT: 23 U/L (ref 0–44)
AST: 41 U/L (ref 15–41)
Albumin: 2.8 g/dL — ABNORMAL LOW (ref 3.5–5.0)
Alkaline Phosphatase: 84 U/L (ref 38–126)
Anion gap: 8 (ref 5–15)
BUN: 19 mg/dL (ref 8–23)
CO2: 25 mmol/L (ref 22–32)
Calcium: 9.2 mg/dL (ref 8.9–10.3)
Chloride: 106 mmol/L (ref 98–111)
Creatinine, Ser: 0.7 mg/dL (ref 0.44–1.00)
GFR calc Af Amer: >60 mL/min (ref >60)
GFR calc non Af Amer: >60 mL/min (ref >60)
Glucose, Bld: 207 mg/dL — ABNORMAL HIGH (ref 70–99)
Potassium: 4.4 mmol/L (ref 3.5–5.1)
Sodium: 139 mmol/L (ref 135–145)
Total Bilirubin: 0.8 mg/dL (ref 0.3–1.2)
Total Protein: 7.2 g/dL (ref 6.5–8.1)

## 2020-02-09 LAB — MAGNESIUM: Magnesium: 1.9 mg/dL (ref 1.7–2.4)

## 2020-02-09 LAB — GLUCOSE, CAPILLARY
Glucose-Capillary: 189 mg/dL — ABNORMAL HIGH (ref 70–99)
Glucose-Capillary: 282 mg/dL — ABNORMAL HIGH (ref 70–99)
Glucose-Capillary: 279 mg/dL — ABNORMAL HIGH (ref 70–99)
Glucose-Capillary: 202 mg/dL — ABNORMAL HIGH (ref 70–99)

## 2020-02-09 LAB — C-REACTIVE PROTEIN: CRP: 6.5 mg/dL — ABNORMAL HIGH (ref <1.0)

## 2020-02-09 LAB — PHOSPHORUS: Phosphorus: 3.5 mg/dL (ref 2.5–4.6)

## 2020-02-09 LAB — CULTURE, BLOOD (ROUTINE X 2)
Culture: NO GROWTH
Special Requests: ADEQUATE

## 2020-02-09 LAB — FIBRIN DERIVATIVES D-DIMER (ARMC ONLY): Fibrin derivatives D-dimer (ARMC): 1183.58 ng/mL (FEU) — ABNORMAL HIGH (ref 0.00–499.00)

## 2020-02-09 LAB — FERRITIN: Ferritin: 1143 ng/mL — ABNORMAL HIGH (ref 11–307)

## 2020-02-09 MED ORDER — FUROSEMIDE 40 MG PO TABS
40.0000 mg | ORAL_TABLET | Freq: Every day | ORAL | Status: DC
  Administered 2020-02-09 – 2020-02-11 (×3): 40 mg via ORAL
  Filled 2020-02-09 (×3): qty 1

## 2020-02-09 NOTE — Progress Notes (Signed)
Pt initially on 5LO2NC this shift. O2 sats ranged from 88-92%. Blood gas revealed PO2 of 54 this AM. Per RT place pt on HFNC. Intially placed pt on 7L but O2 sustained in the 80's so rate increased to 10L HFNC.Pt now satting 93-95%. NP notified.

## 2020-02-09 NOTE — Care Management Important Message (Signed)
Important Message  Patient Details  Name: Julie Wall MRN: [id number] Date of Birth: 03-29-1956   Medicare Important Message Given:  Yes  Reviewed over room phone with patient due to isolation status.  Declined receiving copy of Medicare IM at this time as one being sent to home address by Admitting.     [name] 02/09/2020, 3:14 PM

## 2020-02-09 NOTE — Progress Notes (Signed)
Patient ID: [name], female   DOB: 09/13/1955, 64 y.o.   MRN: [id number] [facility] PROGRESS NOTE  Julie Wall PNT:[id number] DOB: Jun 03, 1956 DOA: 02/05/2020 PCP: Julie Cory, MD  HPI/Subjective: Patient still with cough and some shortness of breath but breathing comfortably.  Had to be placed on high flow nasal cannula.  Currently on 6 L.  Admitted with COVID-19 pneumonia.  Objective: Vitals:   02/09/20 0929 02/09/20 1129  BP:  117/73  Pulse:  68  Resp:  18  Temp:  98.4 F (36.9 C)  SpO2: 93% 98%   No intake or output data in the 24 hours ending 02/09/20 1449 Filed Weights   02/04/20 2019  Weight: 72.6 kg    ROS: Review of Systems  Respiratory: Positive for cough and shortness of breath.   Cardiovascular: Negative for chest pain.  Gastrointestinal: Negative for abdominal pain, nausea and vomiting.   Exam: Physical Exam HENT:     Nose: No mucosal edema.     Mouth/Throat:     Pharynx: No oropharyngeal exudate.  Eyes:     General: Lids are normal.     Conjunctiva/sclera: Conjunctivae normal.  Cardiovascular:     Rate and Rhythm: Normal rate and regular rhythm.     Heart sounds: Normal heart sounds, S1 normal and S2 normal.  Pulmonary:     Breath sounds: Examination of the right-lower field reveals decreased breath sounds and rhonchi. Examination of the left-lower field reveals decreased breath sounds and rhonchi. Decreased breath sounds and rhonchi present. No wheezing or rales.  Abdominal:     Palpations: Abdomen is soft.     Tenderness: There is no abdominal tenderness.  Musculoskeletal:     Right ankle: Swelling present.     Left ankle: Swelling present.  Skin:    General: Skin is warm.     Findings: No rash.  Neurological:     Mental Status: She is alert and oriented to person, place, and time.       Data Reviewed: Basic Metabolic Panel: Recent Labs  Lab 02/04/20 2025 02/06/20 0453 02/07/20 0544 02/08/20 0500 02/09/20 0421  NA 134* 137  140 140 139  K 4.1 4.3 4.0 4.0 4.4  CL 102 108 109 108 106  CO2 19* 20* 23 22 25   GLUCOSE 135* 92 106* 217* 207*  BUN 22 21 22 19 19   CREATININE 0.85 0.61 0.53 0.45 0.70  CALCIUM 8.5* 8.3* 8.6* 8.8* 9.2  MG  --  2.1 2.0 1.9 1.9  PHOS  --  2.7 3.0 3.1 3.5   Liver Function Tests: Recent Labs  Lab 02/04/20 2025 02/06/20 0453 02/07/20 0544 02/08/20 0500 02/09/20 0421  AST 42* 36 44* 44* 41  ALT 18 15 17 20 23   ALKPHOS 55 52 60 75 84  BILITOT 1.4* 1.0 0.7 0.8 0.8  PROT 8.0 6.6 7.0 7.3 7.2  ALBUMIN 3.7 2.9* 2.9* 3.0* 2.8*   CBC: Recent Labs  Lab 02/04/20 2025 02/06/20 0453 02/07/20 0544 02/08/20 0500 02/09/20 0421  WBC 4.7 7.1 7.3 13.5* 17.0*  NEUTROABS  --  5.5 5.5 11.1* 13.7*  HGB 15.1* 13.1 13.9 14.6 12.8  HCT 44.1 38.8 39.1 41.0 37.1  MCV 92.5 94.2 89.5 89.5 90.9  PLT 133* 156 203 251 325   BNP (last 3 results) Recent Labs    02/05/20 0707  BNP 12.7     CBG: Recent Labs  Lab 02/08/20 1217 02/08/20 1544 02/08/20 2116 02/09/20 0722 02/09/20 1129  GLUCAP 153* 275* 224* 189*  282*    Recent Results (from the past 240 hour(s))  CULTURE, URINE COMPREHENSIVE     Status: None   Collection Time: 02/04/20  1:52 PM   Specimen: Urine  Result Value Ref Range Status   MICRO NUMBER: [id number]  Final   SPECIMEN QUALITY: Adequate  Final   Source OTHER (SPECIFY)  Final   STATUS: FINAL  Final   RESULT: No Growth  Final  Blood culture (routine x 2)     Status: None   Collection Time: 02/04/20  8:25 PM   Specimen: BLOOD  Result Value Ref Range Status   Specimen Description BLOOD LEFT AC  Final   Special Requests   Final    BOTTLES DRAWN AEROBIC AND ANAEROBIC Blood Culture adequate volume   Culture   Final    NO GROWTH 5 DAYS Performed at [facility], [address] [place], [place] [address]    Report Status 02/09/2020 FINAL  Final  Urine Culture     Status: None   Collection Time: 02/04/20  8:48 PM   Specimen: Urine, Clean Catch  Result Value  Ref Range Status   Specimen Description   Final    URINE, CLEAN CATCH Performed at [facility], [address] [place], [place] [address]    Special Requests NONE  Final   Culture   Final    NO GROWTH Performed at [facility], [address] [place], [place] [address]    Report Status 02/06/2020 FINAL  Final  SARS Coronavirus 2 by RT PCR (hospital order, performed in [facility] hospital lab) Nasopharyngeal Nasopharyngeal Swab     Status: Abnormal   Collection Time: 02/05/20  4:59 AM   Specimen: Nasopharyngeal Swab  Result Value Ref Range Status   SARS Coronavirus 2 POSITIVE (A) NEGATIVE Final    Comment: RESULT CALLED TO, READ BACK BY AND VERIFIED WITH: ASHTON PETERS AT 0632 ON 02/05/2020 [facility]. (NOTE) SARS-CoV-2 target nucleic acids are DETECTED  SARS-CoV-2 RNA is generally detectable in upper respiratory specimens  during the acute phase of infection.  Positive results are indicative  of the presence of the identified virus, but do not rule out bacterial infection or co-infection with other pathogens not detected by the test.  Clinical correlation with patient history and  other diagnostic information is necessary to determine patient infection status.  The expected result is negative.  Fact Sheet for Patients:   BoilerBrush.com.cyhttps://www.fda.gov/media/136312/download   Fact Sheet for Healthcare Providers:   https://pope.com/https://www.fda.gov/media/136313/download    This test is not yet approved or cleared by the [place] FDA and  has been authorized for detection and/or diagnosis of SARS-CoV-2 by FDA under an Emergency Use Authorization (EUA).  This EUA will remain in effect (meaning thi s test can be used) for the duration of  the COVID-19 declaration under Section 564(b)(1) of the Act, 21 U.S.C. section 360-bbb-3(b)(1), unless the authorization is terminated or revoked sooner.  Performed at [facility], [address] [place], [place] [address]   Culture,  blood (Routine X 2) w Reflex to ID Panel     Status: None (Preliminary result)   Collection Time: 02/05/20  5:09 AM   Specimen: BLOOD  Result Value Ref Range Status   Specimen Description BLOOD LEFT ANTECUBITAL  Final   Special Requests   Final    BOTTLES DRAWN AEROBIC AND ANAEROBIC Blood Culture adequate volume   Culture   Final    NO GROWTH 4 DAYS Performed at [facility], [address], [place], [place] [address]    Report Status PENDING  Incomplete     Studies: DG Chest Port 1 View  Result Date: 02/08/2020 CLINICAL DATA:  Cough.  COVID-19 pneumonia. EXAM: PORTABLE CHEST 1 VIEW COMPARISON:  02/05/2020 portable chest and chest CTA FINDINGS: Mild patchy prominence of the interstitial markings bilaterally with significant improvement. No pleural fluid. The cardiac silhouette remains borderline enlarged. Thoracic spine degenerative changes. IMPRESSION: Improving bilateral pneumonitis. Electronically Signed   By: Beckie Salts M.D.   On: 02/08/2020 17:14    Scheduled Meds: . albuterol  2 puff Inhalation Q6H  . vitamin C  500 mg Oral Daily  . aspirin  81 mg Oral Daily  . atorvastatin  40 mg Oral Daily  . baclofen  20 mg Oral TID  . baricitinib  4 mg Oral Daily  . clobetasol ointment   Topical BID  . dorzolamide-timolol  1 drop Both Eyes BID  . enoxaparin (LOVENOX) injection  40 mg Subcutaneous Q24H  . folic acid  1 mg Oral Daily  . furosemide  40 mg Oral Daily  . gabapentin  300 mg Oral TID  . insulin aspart  0-15 Units Subcutaneous TID WC  . insulin detemir  0.15 Units/kg Subcutaneous Daily  . methylPREDNISolone (SOLU-MEDROL) injection  40 mg Intravenous Q12H  . sodium chloride flush  3 mL Intravenous Q12H  . triamcinolone ointment   Topical BID  . umeclidinium-vilanterol  1 puff Inhalation Daily  . [START ON 02/10/2020] Vitamin D (Ergocalciferol)  50,000 Units Oral Weekly  . zinc sulfate  220 mg Oral Daily   Continuous Infusions: . sodium chloride Stopped (02/06/20  1143)    Assessment/Plan:  1. Acute hypoxic respiratory failure secondary to COVID-19 pneumonia.  Patient required 10 L of high flow nasal cannula overnight and down to 6 L today.  Finished up remdesivir today day 5.  Continue Solu-Medrol.  Continue baricitinib day 2.  Added oral Lasix this morning.  We will get an ultrasound of the lower extremities. 2. Type 2 diabetes mellitus with hyperlipidemia.  Continue Levemir and sliding scale while on steroids.  Sugars up a little bit today.  Continue atorvastatin. 3. Relative hypotension.  Continue to hold losartan 4. Diabetic neuropathy on gabapentin 5. Weakness.  Physical therapy recommends home with home health 6. Hilar lymphadenopathy will end up needing a CT scan as outpatient once improved to follow this up.      Code Status:     Code Status Orders  (From admission, onward)         Start     Ordered   02/05/20 0829  Full code  Continuous        02/05/20 0832        Code Status History    This patient has a current code status but no historical code status.   Advance Care Planning Activity     Family Communication: Spoke with patient's daughter on the phone Disposition Plan: Status is: Inpatient  Dispo: The patient is from: Home              Anticipated d/c is to: Home with home health              Anticipated d/c date is: Now since on high flow nasal cannula may need a few more days here in the hospital              Patient currently being treated for acute hypoxic respiratory failure and  COVID-19 pneumonia.  Continue IV steroids  Time spent: 27 minutes  [employer]

## 2020-02-09 NOTE — Progress Notes (Signed)
Physical Therapy Treatment Patient Details Name: Julie Wall MRN: [id number] DOB: Nov 24, 1955 Today's Date: 02/09/2020    History of Present Illness 64 y.o. female with medical history significant for COPD.  Patient presented to the ER for evaluation of fever with a T-max of 102F associated with a cough/congestion which she has had for a week and left sided flank pain which is described as a sharp, intermittent pain and urinary frequency.  She was advised by her primary care provider to go to the ER for evaluation of fever.  She has had associated nausea along with hypoxia.  Found to be Covid +)    PT Comments    Pt reports feeling okay, but still generally tired and feeling somewhat unsteady.  Her O2 seemed to remain in the 90s t/o the effort of ambulation and light seated exercises (at times inconsistent low readings on pulseox while gripping walker) with some subjective fatigue but no overt shortness of breath or overly labored breathing.  Pt does not use walker at baseline but was clearly needing it during limited in room activity, good effort.  O2 (on 6L) generally >98% while at rest between exercises/activity.  Follow Up Recommendations  Home health PT;Supervision - Intermittent     Equipment Recommendations  Rolling walker with 5" wheels    Recommendations for Other Services       Precautions / Restrictions Precautions Precautions: Fall Restrictions Weight Bearing Restrictions: No    Mobility  Bed Mobility Overal bed mobility: Modified Independent             General bed mobility comments: Pt was able to get to sitting EOB slowly but w/o assist  Transfers Overall transfer level: Modified independent Equipment used: Rolling walker (2 wheeled)             General transfer comment: Opted to use walker again despite baseline, pt able to rise w/o direct assist though again slow and highly reliant on UEs  Ambulation/Gait Ambulation/Gait assistance: Supervision Gait  Distance (Feet): 25 Feet Assistive device: Rolling walker (2 wheeled)       General Gait Details: Pt with very slow and guarded ambulation.  Difficult to get consistent O2 readings while gripping walker, but sats do appear to stay in the (low) 90s on 6L during the effort.  Pt reports some fatigue, but "not too bad"    Stairs             Wheelchair Mobility    Modified Rankin (Stroke Patients Only)       Balance Overall balance assessment: Needs assistance Sitting-balance support: No upper extremity supported Sitting balance-Leahy Scale: Good     Standing balance support: Bilateral upper extremity supported Standing balance-Leahy Scale: Fair Standing balance comment: Slow and guarded, but no LOBs during ambulation using walker.                              Cognition Arousal/Alertness: Awake/alert Behavior During Therapy: WFL for tasks assessed/performed Overall Cognitive Status: Within Functional Limits for tasks assessed                                        Exercises General Exercises - Lower Extremity Ankle Circles/Pumps: AROM;10 reps Nusbaum Arc Quad: Strengthening;10 reps Heel Slides: Strengthening;10 reps Hip ABduction/ADduction: Strengthening;10 reps Hip Flexion/Marching: Strengthening;10 reps    General Comments  Pertinent Vitals/Pain Pain Assessment: No/denies pain    Home Living                      Prior Function            PT Goals (current goals can now be found in the care plan section) Progress towards PT goals: Progressing toward goals    Frequency    Min 2X/week      PT Plan Current plan remains appropriate    Co-evaluation              AM-PAC PT "6 Clicks" Mobility   Outcome Measure  Help needed turning from your back to your side while in a flat bed without using bedrails?: None Help needed moving from lying on your back to sitting on the side of a flat bed without using  bedrails?: None Help needed moving to and from a bed to a chair (including a wheelchair)?: A Little Help needed standing up from a chair using your arms (e.g., wheelchair or bedside chair)?: A Little Help needed to walk in hospital room?: A Little Help needed climbing 3-5 steps with a railing? : A Little 6 Click Score: 20    End of Session Equipment Utilized During Treatment: Oxygen (6L t/o session) Activity Tolerance: Patient limited by fatigue Patient left: with chair alarm set;with call bell/phone within reach Nurse Communication: Mobility status (O2 sats) PT Visit Diagnosis: Muscle weakness (generalized) (M62.81);Difficulty in walking, not elsewhere classified (R26.2);Unsteadiness on feet (R26.81)     Time: 5053-9767 PT Time Calculation (min) (ACUTE ONLY): 26 min  Charges:  $Gait Training: 8-22 mins $Therapeutic Exercise: 8-22 mins                     Malachi Pro, DPT 02/09/2020, 2:32 PM

## 2020-02-10 ENCOUNTER — Inpatient Hospital Stay: Payer: Medicare Other

## 2020-02-10 DIAGNOSIS — J9601 Acute respiratory failure with hypoxia: Secondary | ICD-10-CM

## 2020-02-10 LAB — CBC WITH DIFFERENTIAL/PLATELET
Abs Immature Granulocytes: 0 10*3/uL (ref 0.00–0.07)
Band Neutrophils: 2 %
Basophils Absolute: 0 10*3/uL (ref 0.0–0.1)
Basophils Relative: 0 %
Eosinophils Absolute: 0 10*3/uL (ref 0.0–0.5)
Eosinophils Relative: 0 %
HCT: 41.9 % (ref 36.0–46.0)
Hemoglobin: 14.2 g/dL (ref 12.0–15.0)
Lymphocytes Relative: 19 %
Lymphs Abs: 2.6 10*3/uL (ref 0.7–4.0)
MCH: 31.4 pg (ref 26.0–34.0)
MCHC: 33.9 g/dL (ref 30.0–36.0)
MCV: 92.7 fL (ref 80.0–100.0)
Monocytes Absolute: 0.8 10*3/uL (ref 0.1–1.0)
Monocytes Relative: 6 %
Neutro Abs: 9.5 10*3/uL — ABNORMAL HIGH (ref 1.7–7.7)
Neutrophils Relative %: 67 %
Other: 6 %
Platelets: 316 10*3/uL (ref 150–400)
RBC: 4.52 MIL/uL (ref 3.87–5.11)
RDW: 14.2 % (ref 11.5–15.5)
Smear Review: NORMAL
WBC: 13.8 10*3/uL — ABNORMAL HIGH (ref 4.0–10.5)
nRBC: 0 % (ref 0.0–0.2)

## 2020-02-10 LAB — COMPREHENSIVE METABOLIC PANEL
ALT: 23 U/L (ref 0–44)
AST: 27 U/L (ref 15–41)
Albumin: 2.7 g/dL — ABNORMAL LOW (ref 3.5–5.0)
Alkaline Phosphatase: 80 U/L (ref 38–126)
Anion gap: 9 (ref 5–15)
BUN: 25 mg/dL — ABNORMAL HIGH (ref 8–23)
CO2: 27 mmol/L (ref 22–32)
Calcium: 9.2 mg/dL (ref 8.9–10.3)
Chloride: 103 mmol/L (ref 98–111)
Creatinine, Ser: 0.59 mg/dL (ref 0.44–1.00)
GFR calc Af Amer: >60 mL/min (ref >60)
GFR calc non Af Amer: >60 mL/min (ref >60)
Glucose, Bld: 293 mg/dL — ABNORMAL HIGH (ref 70–99)
Potassium: 4.5 mmol/L (ref 3.5–5.1)
Sodium: 139 mmol/L (ref 135–145)
Total Bilirubin: 0.6 mg/dL (ref 0.3–1.2)
Total Protein: 7 g/dL (ref 6.5–8.1)

## 2020-02-10 LAB — CULTURE, BLOOD (ROUTINE X 2)
Culture: NO GROWTH
Special Requests: ADEQUATE

## 2020-02-10 LAB — FERRITIN: Ferritin: 581 ng/mL — ABNORMAL HIGH (ref 11–307)

## 2020-02-10 LAB — C-REACTIVE PROTEIN: CRP: 2.9 mg/dL — ABNORMAL HIGH (ref <1.0)

## 2020-02-10 LAB — GLUCOSE, CAPILLARY
Glucose-Capillary: 288 mg/dL — ABNORMAL HIGH (ref 70–99)
Glucose-Capillary: 269 mg/dL — ABNORMAL HIGH (ref 70–99)
Glucose-Capillary: 147 mg/dL — ABNORMAL HIGH (ref 70–99)
Glucose-Capillary: 162 mg/dL — ABNORMAL HIGH (ref 70–99)

## 2020-02-10 LAB — MAGNESIUM: Magnesium: 1.9 mg/dL (ref 1.7–2.4)

## 2020-02-10 LAB — FIBRIN DERIVATIVES D-DIMER (ARMC ONLY): Fibrin derivatives D-dimer (ARMC): 421.04 ng/mL (FEU) (ref 0.00–499.00)

## 2020-02-10 LAB — PATHOLOGIST SMEAR REVIEW

## 2020-02-10 LAB — PHOSPHORUS: Phosphorus: 3.4 mg/dL (ref 2.5–4.6)

## 2020-02-10 MED ORDER — INSULIN ASPART 100 UNIT/ML ~~LOC~~ SOLN
0.0000 [IU] | Freq: Three times a day (TID) | SUBCUTANEOUS | Status: DC
  Administered 2020-02-10: 22:00:00 3 [IU] via SUBCUTANEOUS
  Administered 2020-02-10: 2 [IU] via SUBCUTANEOUS
  Administered 2020-02-11: 12:00:00 3 [IU] via SUBCUTANEOUS
  Administered 2020-02-11: 08:00:00 5 [IU] via SUBCUTANEOUS
  Filled 2020-02-10 (×4): qty 1

## 2020-02-10 MED ORDER — INSULIN DETEMIR 100 UNIT/ML ~~LOC~~ SOLN
0.2200 [IU]/kg | Freq: Every day | SUBCUTANEOUS | Status: DC
  Administered 2020-02-11: 08:00:00 16 [IU] via SUBCUTANEOUS
  Filled 2020-02-10 (×2): qty 0.16

## 2020-02-10 MED ORDER — INSULIN ASPART 100 UNIT/ML ~~LOC~~ SOLN
2.0000 [IU] | Freq: Three times a day (TID) | SUBCUTANEOUS | Status: DC
  Administered 2020-02-10 – 2020-02-11 (×3): 2 [IU] via SUBCUTANEOUS
  Filled 2020-02-10 (×3): qty 1

## 2020-02-10 NOTE — Progress Notes (Signed)
Inpatient Diabetes Program Recommendations  AACE/ADA: New Consensus Statement on Inpatient Glycemic Control (2015)  Target Ranges:  Prepandial:   less than 140 mg/dL      Peak postprandial:   less than 180 mg/dL (1-2 hours)      Critically ill patients:  140 - 180 mg/dL   Lab Results  Component Value Date   GLUCAP 269 (H) 02/10/2020   HGBA1C 7.1 (A) 09/05/2019    Review of Glycemic Control Results for Julie Wall, Julie Wall (MRN [id number]) as of 02/10/2020 13:38  Ref. Range 02/09/2020 07:22 02/09/2020 11:29 02/09/2020 16:55 02/09/2020 21:21 02/10/2020 07:29 02/10/2020 11:42  Glucose-Capillary Latest Ref Range: 70 - 99 mg/dL 662 (H) 947 (H) 654 (H) 202 (H) 288 (H) 269 (H)   Diabetes history: DM2 Outpatient Diabetes medications: Jardiance 25 mg qd + Metformin 750 mg.  Current orders for Inpatient glycemic control: Levemir 11 units bid + Novolog moderate correction tid + SoluMedrol 40 mg q 12 hrs.  Inpatient Diabetes Program Recommendations:   If steroids continue as prescribed, consider: -Increase Levemir to 12 units bid -Add Novolog 2 units tid meal coverage if eats 50% -Add Novolog hs correction 0-5 units  Thank you, Darel Hong E. Rolando Hessling, RN, MSN, CDE  Diabetes Coordinator Inpatient Glycemic Control Team Team Pager [phone] (8am-5pm) 02/10/2020 1:42 PM

## 2020-02-10 NOTE — Progress Notes (Signed)
Patient ID: Julie CowboyLaverne Dade, female   DOB: 06/11/1955, 64 y.o.   MRN: [id number] [facility] PROGRESS NOTE  Julie Wall [id number] DOB: 06/05/1956 DOA: 02/05/2020 PCP: Alba CorySowles, Krichna, MD  HPI/Subjective: Patient still has some cough and shortness of breath.  I tried dialing her high flow nasal cannula down to 2 L today from 4 L.  We walked her over to the chair and did become hypoxic with that so I had a bump her back up to 3 L.  Came in with cough and shortness of breath and found to have Covid pneumonia  Objective: Vitals:   02/10/20 1115 02/10/20 1544  BP: 118/70 90/79  Pulse: 71 68  Resp: 19   Temp: 98 F (36.7 C) 97.9 F (36.6 C)  SpO2: 95% 95%   No intake or output data in the 24 hours ending 02/10/20 1718 Filed Weights   02/04/20 2019  Weight: 72.6 kg    ROS: Review of Systems  Respiratory: Positive for cough and shortness of breath.   Cardiovascular: Negative for chest pain.  Gastrointestinal: Negative for abdominal pain, nausea and vomiting.   Exam: Physical Exam HENT:     Nose: No mucosal edema.     Mouth/Throat:     Pharynx: No oropharyngeal exudate.  Eyes:     General: Lids are normal.     Conjunctiva/sclera: Conjunctivae normal.  Cardiovascular:     Rate and Rhythm: Normal rate and regular rhythm.     Heart sounds: Normal heart sounds, S1 normal and S2 normal.  Pulmonary:     Breath sounds: Examination of the right-middle field reveals decreased breath sounds and wheezing. Examination of the left-middle field reveals decreased breath sounds and wheezing. Examination of the right-lower field reveals decreased breath sounds and rhonchi. Examination of the left-lower field reveals decreased breath sounds and rhonchi. Decreased breath sounds, wheezing and rhonchi present.     Comments: After coughing she was able to clear out the wheeze. Abdominal:     Palpations: Abdomen is soft.     Tenderness: There is no abdominal tenderness.  Musculoskeletal:      Right lower leg: No swelling.     Left lower leg: No swelling.  Skin:    General: Skin is warm.     Findings: No rash.     Comments: Vitiligo lower extremities  Neurological:     Mental Status: She is alert and oriented to person, place, and time.       Data Reviewed: Basic Metabolic Panel: Recent Labs  Lab 02/06/20 0453 02/07/20 0544 02/08/20 0500 02/09/20 0421 02/10/20 0610  NA 137 140 140 139 139  K 4.3 4.0 4.0 4.4 4.5  CL 108 109 108 106 103  CO2 20* 23 22 25 27   GLUCOSE 92 106* 217* 207* 293*  BUN 21 22 19 19  25*  CREATININE 0.61 0.53 0.45 0.70 0.59  CALCIUM 8.3* 8.6* 8.8* 9.2 9.2  MG 2.1 2.0 1.9 1.9 1.9  PHOS 2.7 3.0 3.1 3.5 3.4   Liver Function Tests: Recent Labs  Lab 02/06/20 0453 02/07/20 0544 02/08/20 0500 02/09/20 0421 02/10/20 0610  AST 36 44* 44* 41 27  ALT 15 17 20 23 23   ALKPHOS 52 60 75 84 80  BILITOT 1.0 0.7 0.8 0.8 0.6  PROT 6.6 7.0 7.3 7.2 7.0  ALBUMIN 2.9* 2.9* 3.0* 2.8* 2.7*   CBC: Recent Labs  Lab 02/06/20 0453 02/07/20 0544 02/08/20 0500 02/09/20 0421 02/10/20 0610  WBC 7.1 7.3 13.5* 17.0* 13.8*  NEUTROABS  5.5 5.5 11.1* 13.7* 9.5*  HGB 13.1 13.9 14.6 12.8 14.2  HCT 38.8 39.1 41.0 37.1 41.9  MCV 94.2 89.5 89.5 90.9 92.7  PLT 156 203 251 325 316   BNP (last 3 results) Recent Labs    02/05/20 0707  BNP 12.7    CBG: Recent Labs  Lab 02/09/20 1655 02/09/20 2121 02/10/20 0729 02/10/20 1142 02/10/20 1544  GLUCAP 279* 202* 288* 269* 147*    Recent Results (from the past 240 hour(s))  CULTURE, URINE COMPREHENSIVE     Status: None   Collection Time: 02/04/20  1:52 PM   Specimen: Urine  Result Value Ref Range Status   MICRO NUMBER: [id number]  Final   SPECIMEN QUALITY: Adequate  Final   Source OTHER (SPECIFY)  Final   STATUS: FINAL  Final   RESULT: No Growth  Final  Blood culture (routine x 2)     Status: None   Collection Time: 02/04/20  8:25 PM   Specimen: BLOOD  Result Value Ref Range Status   Specimen  Description BLOOD LEFT AC  Final   Special Requests   Final    BOTTLES DRAWN AEROBIC AND ANAEROBIC Blood Culture adequate volume   Culture   Final    NO GROWTH 5 DAYS Performed at [facility], [address], [place], [place] [address]    Report Status 02/09/2020 FINAL  Final  Urine Culture     Status: None   Collection Time: 02/04/20  8:48 PM   Specimen: Urine, Clean Catch  Result Value Ref Range Status   Specimen Description   Final    URINE, CLEAN CATCH Performed at [facility], [address] [place], [place] [address]    Special Requests NONE  Final   Culture   Final    NO GROWTH Performed at [facility], [address], [place], [place] [address]    Report Status 02/06/2020 FINAL  Final  SARS Coronavirus 2 by RT PCR (hospital order, performed in [facility] hospital lab) Nasopharyngeal Nasopharyngeal Swab     Status: Abnormal   Collection Time: 02/05/20  4:59 AM   Specimen: Nasopharyngeal Swab  Result Value Ref Range Status   SARS Coronavirus 2 POSITIVE (A) NEGATIVE Final    Comment: RESULT CALLED TO, READ BACK BY AND VERIFIED WITH: ASHTON PETERS AT 0632 ON 02/05/2020 [facility]. (NOTE) SARS-CoV-2 target nucleic acids are DETECTED  SARS-CoV-2 RNA is generally detectable in upper respiratory specimens  during the acute phase of infection.  Positive results are indicative  of the presence of the identified virus, but do not rule out bacterial infection or co-infection with other pathogens not detected by the test.  Clinical correlation with patient history and  other diagnostic information is necessary to determine patient infection status.  The expected result is negative.  Fact Sheet for Patients:   BoilerBrush.com.cy   Fact Sheet for Healthcare Providers:   https://pope.com/    This test is not yet approved or cleared by the [place] FDA and  has been authorized for detection and/or  diagnosis of SARS-CoV-2 by FDA under an Emergency Use Authorization (EUA).  This EUA will remain in effect (meaning thi s test can be used) for the duration of  the COVID-19 declaration under Section 564(b)(1) of the Act, 21 U.S.C. section 360-bbb-3(b)(1), unless the authorization is terminated or revoked sooner.  Performed at [facility], [address] [place], [place] [address]   Culture, blood (Routine X 2) w Reflex  to ID Panel     Status: None   Collection Time: 02/05/20  5:09 AM   Specimen: BLOOD  Result Value Ref Range Status   Specimen Description BLOOD LEFT ANTECUBITAL  Final   Special Requests   Final    BOTTLES DRAWN AEROBIC AND ANAEROBIC Blood Culture adequate volume   Culture   Final    NO GROWTH 5 DAYS Performed at [facility], [address] [place], [place] [address]    Report Status 02/10/2020 FINAL  Final     Studies: US Venous Img Lower Bilateral (DVT)  Result Date: 02/10/2020 CLINICAL DATA:  Pain and swelling EXAM: BILATERAL LOWER EXTREMITY VENOUS DOPPLER ULTRASOUND TECHNIQUE: Gray-scale sonography with compression, as well as color and duplex ultrasound, were performed to evaluate the deep venous system(s) from the level of the common femoral vein through the popliteal and proximal calf veins. COMPARISON:  None. FINDINGS: VENOUS Normal compressibility of the common femoral, superficial femoral, and popliteal veins, as well as the visualized calf veins. Visualized portions of profunda femoral vein and great saphenous vein unremarkable. No filling defects to suggest DVT on grayscale or color Doppler imaging. Doppler waveforms show normal direction of venous flow, normal respiratory plasticity and response to augmentation. OTHER None. Limitations: none IMPRESSION: Negative. Electronically Signed   By: Katherine Mantle M.D.   On: 02/10/2020 17:07    Scheduled Meds: . albuterol  2 puff Inhalation Q6H  . vitamin C  500 mg Oral Daily  . aspirin   81 mg Oral Daily  . atorvastatin  40 mg Oral Daily  . baclofen  20 mg Oral TID  . baricitinib  4 mg Oral Daily  . clobetasol ointment   Topical BID  . dorzolamide-timolol  1 drop Both Eyes BID  . enoxaparin (LOVENOX) injection  40 mg Subcutaneous Q24H  . folic acid  1 mg Oral Daily  . furosemide  40 mg Oral Daily  . gabapentin  300 mg Oral TID  . insulin aspart  0-15 Units Subcutaneous TID WC & HS  . insulin aspart  2 Units Subcutaneous TID WC  . [START ON 02/11/2020] insulin detemir  0.22 Units/kg Subcutaneous Daily  . methylPREDNISolone (SOLU-MEDROL) injection  40 mg Intravenous Q12H  . sodium chloride flush  3 mL Intravenous Q12H  . triamcinolone ointment   Topical BID  . umeclidinium-vilanterol  1 puff Inhalation Daily  . Vitamin D (Ergocalciferol)  50,000 Units Oral Weekly  . zinc sulfate  220 mg Oral Daily   Continuous Infusions: . sodium chloride Stopped (02/09/20 1157)    Assessment/Plan:  1. Acute hypoxic respiratory failure secondary to COVID-19 pneumonia.  The patient was as high as 10 L of high flow nasal cannula and has been tapered down to 4 L this morning when I saw her.  I was able to dilate her down to 3 L.  Did have some hypoxia with walking to the chair when I was in there.  Completed course of remdesivir.  Continue Solu-Medrol.  Continue baricitinib day 3.  Ultrasound the lower extremities negative for DVT.  On 9/2 had a negative CT scan of the chest for pulmonary embolism.  Trial of Lasix. 2. Type 2 diabetes mellitus with hyperlipidemia.  Increase Levemir secondary to sugars being up today and sliding scale while on steroids.  Continue atorvastatin. 3. Relative hypotension.  Blood pressure still low.  Continue to hold losartan. 4. Diabetic neuropathy on gabapentin 5. Weakness.  Physical therapy recommends home with home health 6. Hilar  lymphadenopathy.  Likely from infection will need a repeat CT scan when better down the line as outpatient.     Code Status:      Code Status Orders  (From admission, onward)         Start     Ordered   02/05/20 0829  Full code  Continuous        02/05/20 0832        Code Status History    This patient has a current code status but no historical code status.   Advance Care Planning Activity     Family Communication: Spoke with the patient's daughter on the phone Disposition Plan: Status is: Inpatient  Dispo: The patient is from: Home              Anticipated d/c is to: Home with home health              Anticipated d/c date is: Unable to go home on high flow nasal cannula              Patient currently being treated for acute hypoxic respiratory failure with COVID-19 pneumonia.  Currently on high flow nasal cannula.  Time spent: 26 minutes  [employer]ie [employer]

## 2020-02-10 NOTE — Progress Notes (Signed)
PT Cancellation Note  Patient Details Name: Julie Wall MRN: [id number] DOB: 07/02/1955   Cancelled Treatment:    Reason Eval/Treat Not Completed: Patient at procedure or test/unavailable  Attempted to see pt, she had just started in in-room test ([place]?).  Pt seen yesterday by this PT and did relatively well, will maintain on PT caseload and see at later date as appropriate.  Malachi Pro, DPT 02/10/2020, 4:16 PM

## 2020-02-11 LAB — GLUCOSE, CAPILLARY
Glucose-Capillary: 224 mg/dL — ABNORMAL HIGH (ref 70–99)
Glucose-Capillary: 192 mg/dL — ABNORMAL HIGH (ref 70–99)

## 2020-02-11 MED ORDER — ASCORBIC ACID 500 MG PO TABS: 500.0000 mg | ORAL_TABLET | Freq: Every day | ORAL | 2 refills | Status: AC

## 2020-02-11 MED ORDER — PREDNISONE 20 MG PO TABS: 40.0000 mg | ORAL_TABLET | Freq: Every day | ORAL | 0 refills | Status: AC

## 2020-02-11 MED ORDER — VITAMIN D 25 MCG (1000 UNIT) PO TABS: 1000.0000 [IU] | ORAL_TABLET | Freq: Every day | ORAL | 1 refills | Status: DC

## 2020-02-11 MED ORDER — ZINC SULFATE 220 (50 ZN) MG PO CAPS: 220.0000 mg | ORAL_CAPSULE | Freq: Every day | ORAL | 2 refills | Status: AC

## 2020-02-11 MED ORDER — BARICITINIB 2 MG PO TABS: 4.0000 mg | ORAL_TABLET | Freq: Every day | ORAL | 0 refills | Status: DC

## 2020-02-11 MED ORDER — GUAIFENESIN-DM 100-10 MG/5ML PO SYRP: 10.0000 mL | ORAL_SOLUTION | ORAL | 0 refills | Status: DC | PRN

## 2020-02-11 MED ORDER — TRIAMCINOLONE ACETONIDE 0.1 % EX OINT: TOPICAL_OINTMENT | Freq: Two times a day (BID) | CUTANEOUS | 0 refills | Status: DC

## 2020-02-11 NOTE — Progress Notes (Signed)
Received Md order to discharge patient to home, reviewed home meds, discharge instructions follow up appointments and home oxygen use with patient and patient verbalized underestimating.

## 2020-02-11 NOTE — Progress Notes (Signed)
Physical Therapy Treatment Patient Details Name: Julie Wall MRN: [id number] DOB: 11/22/1955 Today's Date: 02/11/2020    History of Present Illness 64 y.o. female with medical history significant for COPD.  Patient presented to the ER for evaluation of fever with a T-max of 102F associated with a cough/congestion which she has had for a week and left sided flank pain which is described as a sharp, intermittent pain and urinary frequency.  She was advised by her primary care provider to go to the ER for evaluation of fever.  She has had associated nausea along with hypoxia.  Found to be Covid +)    PT Comments    Ready for session.  In and OOB with rail but no assist.  Stood to walker pulling up on walker despite cues.  She walks 15' x 1 with RW and min a x 1.  Somewhat unsteady today with very slow step to gait.  Needed assist to prevent falls today.  AFter seated rest, she is able to stand x 2 for exercises.  She did report some dizziness but BP stable 116/77.  Sats remains >93% on 4 lpm with activity.  She will need +1 assist for mobility upon discharge.   Follow Up Recommendations  Home health PT;Supervision for mobility/OOB     Equipment Recommendations  Rolling walker with 5" wheels    Recommendations for Other Services       Precautions / Restrictions Precautions Precautions: Fall    Mobility  Bed Mobility Overal bed mobility: Modified Independent                Transfers Overall transfer level: Modified independent Equipment used: Rolling walker (2 wheeled)                Ambulation/Gait Ambulation/Gait assistance: Min assist Gait Distance (Feet): 15 Feet Assistive device: Rolling walker (2 wheeled)   Gait velocity: decreased   General Gait Details: veryslow with occasionl LOB's.  StedyO2 redings on 4 lpm >93% during activity   Stairs             Wheelchair Mobility    Modified Rankin (Stroke Patients Only)       Balance Overall  balance assessment: Needs assistance Sitting-balance support: No upper extremity supported Sitting balance-Leahy Scale: Good     Standing balance support: Bilateral upper extremity supported Standing balance-Leahy Scale: Fair                              Cognition Arousal/Alertness: Awake/alert Behavior During Therapy: WFL for tasks assessed/performed Overall Cognitive Status: Within Functional Limits for tasks assessed                                        Exercises Other Exercises Other Exercises: standing 2 x 10 with seated rest.- SLR, marches and heel raises    General Comments        Pertinent Vitals/Pain Pain Assessment: No/denies pain    Home Living                      Prior Function            PT Goals (current goals can now be found in the care plan section) Progress towards PT goals: Progressing toward goals    Frequency    Min 2X/week  PT Plan Current plan remains appropriate    Co-evaluation              AM-PAC PT "6 Clicks" Mobility   Outcome Measure  Help needed turning from your back to your side while in a flat bed without using bedrails?: None Help needed moving from lying on your back to sitting on the side of a flat bed without using bedrails?: None Help needed moving to and from a bed to a chair (including a wheelchair)?: A Little Help needed standing up from a chair using your arms (e.g., wheelchair or bedside chair)?: A Little Help needed to walk in hospital room?: A Little Help needed climbing 3-5 steps with a railing? : A Little 6 Click Score: 20    End of Session Equipment Utilized During Treatment: Oxygen Activity Tolerance: Patient limited by fatigue Patient left: in bed;with call bell/phone within reach;with bed alarm set Nurse Communication: Mobility status       Time: 8333-8329 PT Time Calculation (min) (ACUTE ONLY): 32 min  Charges:  $Gait Training: 8-22  mins $Therapeutic Exercise: 8-22 mins                    Danielle Dess, PTA 02/11/20, 9:43 AM

## 2020-02-11 NOTE — Progress Notes (Addendum)
SATURATION QUALIFICATIONS: (This note is used to comply with regulatory documentation for home oxygen)  Patient Saturations on Room Air at Rest = 85%  Patient Saturations on  3 Liters of oxygen at rest  = 93%  Please briefly explain why patient needs home oxygen: Pneumonia due to COVID

## 2020-02-11 NOTE — Discharge Summary (Signed)
Physician Discharge Summary  Julie Masa [id number] DOB: 1955-10-14 DOA: 02/05/2020  PCP: Alba Cory, MD  Admit date: 02/05/2020 Discharge date: 02/11/2020  Admitted From: Home Disposition:  Home  Recommendations for Outpatient Follow-up:  1. Follow up with PCP in 1-2 weeks 2. Please obtain BMP/CBC in one week 3. Please follow up on the following pending results:None  Home Health:Yes Equipment/Devices:home oxygen  Discharge Condition: Stable CODE STATUS: Full Diet recommendation: Heart Healthy / Carb Modified   Brief/Interim Summary: Julie Wall is a 64 y.o. female with medical history significant for COPD.  Patient presented to the ER for evaluation of fever with a T-max of 102F associated with a cough/congestion which she has had for a week. Found to have COVID-19 pneumonia with hypoxia requiring supplemental oxygen.  CTA was negative for PE.  She was treated with remdesivir and steroid right.  Completed a course of remdesivir while in the hospital.  She was also given baricitinib while in the hospital.  Continue to require 2 to 3 L of oxygen.  Discharged home with oxygen.  Baricitinib has to be discontinued before 14 days due to insurance issues.  She will continue with steroids and follow-up with his primary care provider.  Patient will continue with rest of her home medications.  Discharge Diagnoses:  Principal Problem:   Pneumonia due to COVID-19 virus Active Problems:   Type 2 diabetes mellitus with hyperlipidemia (HCC)   Diabetes mellitus (HCC)   Hypothyroidism   Hypertension, benign   Hilar lymphadenopathy   Hypotension   Diabetic polyneuropathy associated with type 2 diabetes mellitus (HCC)   Weakness   Acute respiratory failure with hypoxemia [facility])   Discharge Instructions  Discharge Instructions    Diet - low sodium heart healthy   Complete by: As directed    Discharge instructions   Complete by: As directed    It was pleasure taking care of  you. Please use oxygen as directed.You will need 2 L all the time, can go upto 4 L with walking around. Continue taking steroid for another 5 days. Continue taking rest of her medications and follow-up with your primary care provider.   Increase activity slowly   Complete by: As directed      Allergies as of 02/11/2020      Reactions   Penicillins Rash      Medication List    TAKE these medications   albuterol 108 (90 Base) MCG/ACT inhaler Commonly known as: VENTOLIN HFA Inhale 2 puffs into the lungs every 6 (six) hours as needed for wheezing or shortness of breath.   Anoro Ellipta 62.5-25 MCG/INH Aepb Generic drug: umeclidinium-vilanterol Inhale 1 puff into the lungs daily.   ascorbic acid 500 MG tablet Commonly known as: VITAMIN C Take 1 tablet (500 mg total) by mouth daily. Start taking on: February 12, 2020   aspirin 81 MG chewable tablet Chew 1 tablet by mouth daily.   atorvastatin 40 MG tablet Commonly known as: LIPITOR Take 1 tablet (40 mg total) by mouth daily. In place of simvastatin   cholecalciferol 25 MCG (1000 UNIT) tablet Commonly known as: VITAMIN D3 Take 1 tablet (1,000 Units total) by mouth daily.   dorzolamide-timolol 22.3-6.8 MG/ML ophthalmic solution Commonly known as: COSOPT Place 1 drop into both eyes 2 (two) times daily.   empagliflozin 25 MG Tabs tablet Commonly known as: Jardiance Take 1 tablet (25 mg total) by mouth daily.   folic acid 1 MG tablet Commonly known as: FOLVITE Take 1 mg by mouth  daily.   gabapentin 100 MG capsule Commonly known as: NEURONTIN Take 200 mg by mouth 3 (three) times daily.   glucose blood test strip Check blood sugar once daily fasting, and up to 4 times daily PRN What changed: additional instructions   guaiFENesin-dextromethorphan 100-10 MG/5ML syrup Commonly known as: ROBITUSSIN DM Take 10 mLs by mouth every 4 (four) hours as needed for cough.   HYDROcodone-acetaminophen 5-325 MG tablet Commonly known  as: NORCO/VICODIN Take 2 tablets by mouth 2 (two) times daily as needed for moderate pain.   losartan 25 MG tablet Commonly known as: Cozaar Take 0.5 tablets (12.5 mg total) by mouth daily. In place of lisinopril   metFORMIN 750 MG 24 hr tablet Commonly known as: GLUCOPHAGE-XR Take 2 tablets (1,500 mg total) by mouth daily with breakfast.   methotrexate 2.5 MG tablet Commonly known as: RHEUMATREX Take 15 mg by mouth every Friday.   predniSONE 20 MG tablet Commonly known as: DELTASONE Take 2 tablets (40 mg total) by mouth daily with breakfast for 5 days.   rOPINIRole 4 MG 24 hr tablet Commonly known as: REQUIP XL Take 4 mg by mouth at bedtime.   Simple Diagnostics Lancing Dev Misc Check blood sugar once daily fasting, and up to 4 times daily PRN.   triamcinolone ointment 0.1 % Commonly known as: KENALOG Apply topically 2 (two) times daily. What changed:   when to take this  additional instructions   zinc sulfate 220 (50 Zn) MG capsule Take 1 capsule (220 mg total) by mouth daily. Start taking on: February 12, 2020            Durable Medical Equipment  (From admission, onward)         Start     Ordered   02/11/20 1021  For home use only DME oxygen  Once       Question Answer Comment  Length of Need 6 Months   Mode or (Route) Nasal cannula   Liters per Minute 3   Frequency Continuous (stationary and portable oxygen unit needed)   Oxygen conserving device Yes   Oxygen delivery system Gas      02/11/20 1021   02/11/20 1006  For home use only DME Walker rolling  Once       Question Answer Comment  Walker: With 5 Inch Wheels   Patient needs a walker to treat with the following condition Generalized weakness      02/11/20 1006          Follow-up Information    Alba Cory, MD. Go on 02/12/2020.   Specialty: Family Medicine Why: Office is going to call Patient to schedule follow up appt Contact information: [address] [place]  [place] [address] [phone]              Allergies  Allergen Reactions  . Penicillins Rash    Consultations:  None  Procedures/Studies: CT Angio Chest PE W and/or Wo Contrast  Result Date: 02/05/2020 CLINICAL DATA:  Dizziness and headache. Flank pain, kidney stone versus pyelonephritis. COVID positive. Fever with mild cough, congestion, left-sided flank pain. EXAM: CT ANGIOGRAPHY CHEST CT ABDOMEN AND PELVIS WITH CONTRAST TECHNIQUE: Multidetector CT imaging of the chest was performed using the standard protocol during bolus administration of intravenous contrast. Multiplanar CT image reconstructions and MIPs were obtained to evaluate the vascular anatomy. Multidetector CT imaging of the abdomen and pelvis was performed using the standard protocol during bolus administration of intravenous contrast. CONTRAST:  OMNIPAQUE  IOHEXOL 350 MG/ML SOLN COMPARISON:  Lung cancer screening chest CT 05/12/2019. CT stone study 07/24/2018. FINDINGS: CTA CHEST FINDINGS Cardiovascular: Heart size upper normal without substantial pericardial effusion. Atherosclerotic calcification is noted in the wall of the thoracic aorta. No thoracic aortic aneurysm in there is no evidence for dissection of the thoracic aorta. No filling defects within the opacified pulmonary arteries to suggest the presence of an acute pulmonary embolus Mediastinum/Nodes: Borderline mediastinal and left hilar lymphadenopathy evident. Index AP window lymph node measures 10 mm short axis on 29/4. 11 mm short axis left hilar node seen on 30/8/4. Subcarinal node on 43/4 is 14 mm short axis. Small lymph nodes are evident in the right hilum. The esophagus has normal imaging features. 11 mm short axis right retrocrural node evident, stable since 07/24/2018. There is no axillary lymphadenopathy. Lungs/Pleura: There is new diffuse patchy interstitial and ground-glass alveolar opacity in the lungs bilaterally. The ground-glass disease has a central  predominance in the upper lobes it is more peripherally predominant in the lower lungs bilaterally. No associated pleural effusion. Musculoskeletal: No worrisome lytic or sclerotic osseous abnormality. Review of the MIP images confirms the above findings. CT ABDOMEN and PELVIS FINDINGS Hepatobiliary: No suspicious focal abnormality within the liver parenchyma. There is no evidence for gallstones, gallbladder wall thickening, or pericholecystic fluid. No intrahepatic or extrahepatic biliary dilation. Pancreas: No focal mass lesion. No dilatation of the main duct. No intraparenchymal cyst. No peripancreatic edema. Spleen: No splenomegaly. No focal mass lesion. Adrenals/Urinary Tract: No adrenal nodule or mass. Kidneys unremarkable. Specifically, no evidence for urinary stone disease. No segmental edema in either kidney to suggest pyelonephritis. No evidence for hydroureter. The urinary bladder appears normal for the degree of distention. Stomach/Bowel: Stomach is unremarkable. No gastric wall thickening. No evidence of outlet obstruction. Duodenum is normally positioned as is the ligament of Treitz. No small bowel wall thickening. No small bowel dilatation. The terminal ileum is normal. The appendix is normal. No gross colonic mass. No colonic wall thickening. Vascular/Lymphatic: There is abdominal aortic atherosclerosis without aneurysm. There is no gastrohepatic or hepatoduodenal ligament lymphadenopathy. No retroperitoneal or mesenteric lymphadenopathy. No pelvic sidewall lymphadenopathy. 12 mm short axis left groin lymph node is upper normal to borderline increased, but stable since 07/24/2018 suggesting benign reactive etiology. Reproductive: The uterus is unremarkable.  There is no adnexal mass. Other: No intraperitoneal free fluid. Musculoskeletal: No worrisome lytic or sclerotic osseous abnormality. Review of the MIP images confirms the above findings. IMPRESSION: 1. No CT evidence for acute pulmonary embolus.  2. Interval development of diffuse patchy interstitial and ground-glass alveolar opacity in the lungs bilaterally since lung cancer screening CT of 07/24/2018. Imaging features are nonspecific and may be related to multifocal infectious/inflammatory etiology although pulmonary edema or hemorrhage could have this appearance. 3. Borderline mediastinal and left hilar lymphadenopathy, potentially reactive. Follow-up CT chest in 3 months recommended to ensure resolution. 4. No acute findings in the abdomen/pelvis. Specifically, no findings to suggest pyelonephritis. No urinary stone disease. 5. Aortic Atherosclerosis (ICD10-I70.0). Electronically Signed   By: Kennith Center M.D.   On: 02/05/2020 07:54   CT ABDOMEN PELVIS W CONTRAST  Result Date: 02/05/2020 CLINICAL DATA:  Dizziness and headache. Flank pain, kidney stone versus pyelonephritis. COVID positive. Fever with mild cough, congestion, left-sided flank pain. EXAM: CT ANGIOGRAPHY CHEST CT ABDOMEN AND PELVIS WITH CONTRAST TECHNIQUE: Multidetector CT imaging of the chest was performed using the standard protocol during bolus administration of intravenous contrast. Multiplanar CT image reconstructions and MIPs were  obtained to evaluate the vascular anatomy. Multidetector CT imaging of the abdomen and pelvis was performed using the standard protocol during bolus administration of intravenous contrast. CONTRAST:  OMNIPAQUE IOHEXOL 350 MG/ML SOLN COMPARISON:  Lung cancer screening chest CT 05/12/2019. CT stone study 07/24/2018. FINDINGS: CTA CHEST FINDINGS Cardiovascular: Heart size upper normal without substantial pericardial effusion. Atherosclerotic calcification is noted in the wall of the thoracic aorta. No thoracic aortic aneurysm in there is no evidence for dissection of the thoracic aorta. No filling defects within the opacified pulmonary arteries to suggest the presence of an acute pulmonary embolus Mediastinum/Nodes: Borderline mediastinal and left  hilar lymphadenopathy evident. Index AP window lymph node measures 10 mm short axis on 29/4. 11 mm short axis left hilar node seen on 30/8/4. Subcarinal node on 43/4 is 14 mm short axis. Small lymph nodes are evident in the right hilum. The esophagus has normal imaging features. 11 mm short axis right retrocrural node evident, stable since 07/24/2018. There is no axillary lymphadenopathy. Lungs/Pleura: There is new diffuse patchy interstitial and ground-glass alveolar opacity in the lungs bilaterally. The ground-glass disease has a central predominance in the upper lobes it is more peripherally predominant in the lower lungs bilaterally. No associated pleural effusion. Musculoskeletal: No worrisome lytic or sclerotic osseous abnormality. Review of the MIP images confirms the above findings. CT ABDOMEN and PELVIS FINDINGS Hepatobiliary: No suspicious focal abnormality within the liver parenchyma. There is no evidence for gallstones, gallbladder wall thickening, or pericholecystic fluid. No intrahepatic or extrahepatic biliary dilation. Pancreas: No focal mass lesion. No dilatation of the main duct. No intraparenchymal cyst. No peripancreatic edema. Spleen: No splenomegaly. No focal mass lesion. Adrenals/Urinary Tract: No adrenal nodule or mass. Kidneys unremarkable. Specifically, no evidence for urinary stone disease. No segmental edema in either kidney to suggest pyelonephritis. No evidence for hydroureter. The urinary bladder appears normal for the degree of distention. Stomach/Bowel: Stomach is unremarkable. No gastric wall thickening. No evidence of outlet obstruction. Duodenum is normally positioned as is the ligament of Treitz. No small bowel wall thickening. No small bowel dilatation. The terminal ileum is normal. The appendix is normal. No gross colonic mass. No colonic wall thickening. Vascular/Lymphatic: There is abdominal aortic atherosclerosis without aneurysm. There is no gastrohepatic or hepatoduodenal  ligament lymphadenopathy. No retroperitoneal or mesenteric lymphadenopathy. No pelvic sidewall lymphadenopathy. 12 mm short axis left groin lymph node is upper normal to borderline increased, but stable since 07/24/2018 suggesting benign reactive etiology. Reproductive: The uterus is unremarkable.  There is no adnexal mass. Other: No intraperitoneal free fluid. Musculoskeletal: No worrisome lytic or sclerotic osseous abnormality. Review of the MIP images confirms the above findings. IMPRESSION: 1. No CT evidence for acute pulmonary embolus. 2. Interval development of diffuse patchy interstitial and ground-glass alveolar opacity in the lungs bilaterally since lung cancer screening CT of 07/24/2018. Imaging features are nonspecific and may be related to multifocal infectious/inflammatory etiology although pulmonary edema or hemorrhage could have this appearance. 3. Borderline mediastinal and left hilar lymphadenopathy, potentially reactive. Follow-up CT chest in 3 months recommended to ensure resolution. 4. No acute findings in the abdomen/pelvis. Specifically, no findings to suggest pyelonephritis. No urinary stone disease. 5. Aortic Atherosclerosis (ICD10-I70.0). Electronically Signed   By: Kennith Center M.D.   On: 02/05/2020 07:54   US Venous Img Lower Bilateral (DVT)  Result Date: 02/10/2020 CLINICAL DATA:  Pain and swelling EXAM: BILATERAL LOWER EXTREMITY VENOUS DOPPLER ULTRASOUND TECHNIQUE: Gray-scale sonography with compression, as well as color and duplex ultrasound, were performed  to evaluate the deep venous system(s) from the level of the common femoral vein through the popliteal and proximal calf veins. COMPARISON:  None. FINDINGS: VENOUS Normal compressibility of the common femoral, superficial femoral, and popliteal veins, as well as the visualized calf veins. Visualized portions of profunda femoral vein and great saphenous vein unremarkable. No filling defects to suggest DVT on grayscale or color  Doppler imaging. Doppler waveforms show normal direction of venous flow, normal respiratory plasticity and response to augmentation. OTHER None. Limitations: none IMPRESSION: Negative. Electronically Signed   By: Katherine Mantle M.D.   On: 02/10/2020 17:07   DG Chest Port 1 View  Result Date: 02/08/2020 CLINICAL DATA:  Cough.  COVID-19 pneumonia. EXAM: PORTABLE CHEST 1 VIEW COMPARISON:  02/05/2020 portable chest and chest CTA FINDINGS: Mild patchy prominence of the interstitial markings bilaterally with significant improvement. No pleural fluid. The cardiac silhouette remains borderline enlarged. Thoracic spine degenerative changes. IMPRESSION: Improving bilateral pneumonitis. Electronically Signed   By: Beckie Salts M.D.   On: 02/08/2020 17:14   DG Chest Portable 1 View  Result Date: 02/05/2020 CLINICAL DATA:  Fever.  Cough. EXAM: PORTABLE CHEST 1 VIEW COMPARISON:  CT 05/12/2019. FINDINGS: Mediastinum hilar structures are unremarkable. Heart size stable. Diffuse bilateral pulmonary infiltrates/edema. Small bilateral pleural effusions cannot be completely excluded. No pneumothorax. IMPRESSION: Diffuse bilateral pulmonary infiltrates/edema. Small bilateral pleural effusions cannot be completely excluded. Electronically Signed   By: Maisie Fus  Register   On: 02/05/2020 05:27    Subjective: Patient was feeling little better when seen today. Still coughing.agreed to go home with oxygen. Saturating 98% on 4 at rest.  Discharge Exam: Vitals:   02/11/20 1055 02/11/20 1141  BP:  116/77  Pulse:  (!) 59  Resp:  20  Temp:  98.6 F (37 C)  SpO2: 93% 97%   Vitals:   02/11/20 1022 02/11/20 1027 02/11/20 1055 02/11/20 1141  BP:    116/77  Pulse:    (!) 59  Resp:    20  Temp:    98.6 F (37 C)  TempSrc:    Oral  SpO2: (!) 85% 96% 93% 97%  Weight:      Height:        General: Pt is alert, awake, not in acute distress Cardiovascular: RRR, S1/S2 +, no rubs, no gallops Respiratory: CTA bilaterally,  no wheezing, no rhonchi Abdominal: Soft, NT, ND, bowel sounds + Extremities: no edema, no cyanosis   The results of significant diagnostics from this hospitalization (including imaging, microbiology, ancillary and laboratory) are listed below for reference.    Microbiology: Recent Results (from the past 240 hour(s))  CULTURE, URINE COMPREHENSIVE     Status: None   Collection Time: 02/04/20  1:52 PM   Specimen: Urine  Result Value Ref Range Status   MICRO NUMBER: [id number]  Final   SPECIMEN QUALITY: Adequate  Final   Source OTHER (SPECIFY)  Final   STATUS: FINAL  Final   RESULT: No Growth  Final  Blood culture (routine x 2)     Status: None   Collection Time: 02/04/20  8:25 PM   Specimen: BLOOD  Result Value Ref Range Status   Specimen Description BLOOD LEFT AC  Final   Special Requests   Final    BOTTLES DRAWN AEROBIC AND ANAEROBIC Blood Culture adequate volume   Culture   Final    NO GROWTH 5 DAYS Performed at [facility], [address] [place], [place] [address]    Report Status 02/09/2020  FINAL  Final  Urine Culture     Status: None   Collection Time: 02/04/20  8:48 PM   Specimen: Urine, Clean Catch  Result Value Ref Range Status   Specimen Description   Final    URINE, CLEAN CATCH Performed at [facility], [address] [place], [place] [address]    Special Requests NONE  Final   Culture   Final    NO GROWTH Performed at [facility], [address] [place], [place] [address]    Report Status 02/06/2020 FINAL  Final  SARS Coronavirus 2 by RT PCR (hospital order, performed in [facility] hospital lab) Nasopharyngeal Nasopharyngeal Swab     Status: Abnormal   Collection Time: 02/05/20  4:59 AM   Specimen: Nasopharyngeal Swab  Result Value Ref Range Status   SARS Coronavirus 2 POSITIVE (A) NEGATIVE Final    Comment: RESULT CALLED TO, READ BACK BY AND VERIFIED WITH: ASHTON PETERS AT 0632 ON 02/05/2020 [facility]. (NOTE) SARS-CoV-2 target  nucleic acids are DETECTED  SARS-CoV-2 RNA is generally detectable in upper respiratory specimens  during the acute phase of infection.  Positive results are indicative  of the presence of the identified virus, but do not rule out bacterial infection or co-infection with other pathogens not detected by the test.  Clinical correlation with patient history and  other diagnostic information is necessary to determine patient infection status.  The expected result is negative.  Fact Sheet for Patients:   BoilerBrush.com.cyhttps://www.fda.gov/media/136312/download   Fact Sheet for Healthcare Providers:   https://pope.com/https://www.fda.gov/media/136313/download    This test is not yet approved or cleared by the [place] FDA and  has been authorized for detection and/or diagnosis of SARS-CoV-2 by FDA under an Emergency Use Authorization (EUA).  This EUA will remain in effect (meaning thi s test can be used) for the duration of  the COVID-19 declaration under Section 564(b)(1) of the Act, 21 U.S.C. section 360-bbb-3(b)(1), unless the authorization is terminated or revoked sooner.  Performed at [facility], [address] [place], [place] [address]   Culture, blood (Routine X 2) w Reflex to ID Panel     Status: None   Collection Time: 02/05/20  5:09 AM   Specimen: BLOOD  Result Value Ref Range Status   Specimen Description BLOOD LEFT ANTECUBITAL  Final   Special Requests   Final    BOTTLES DRAWN AEROBIC AND ANAEROBIC Blood Culture adequate volume   Culture   Final    NO GROWTH 5 DAYS Performed at [facility], [address] [place], [place] [address]    Report Status 02/10/2020 FINAL  Final     Labs: BNP (last 3 results) Recent Labs    02/05/20 0707  BNP 12.7   Basic Metabolic Panel: Recent Labs  Lab 02/06/20 0453 02/07/20 0544 02/08/20 0500 02/09/20 0421 02/10/20 0610  NA 137 140 140 139 139  K 4.3 4.0 4.0 4.4 4.5  CL 108 109 108 106 103  CO2 20* 23 22 25 27   GLUCOSE 92  106* 217* 207* 293*  BUN 21 22 19 19  25*  CREATININE 0.61 0.53 0.45 0.70 0.59  CALCIUM 8.3* 8.6* 8.8* 9.2 9.2  MG 2.1 2.0 1.9 1.9 1.9  PHOS 2.7 3.0 3.1 3.5 3.4   Liver Function Tests: Recent Labs  Lab 02/06/20 0453 02/07/20 0544 02/08/20 0500 02/09/20 0421 02/10/20 0610  AST 36 44* 44* 41 27  ALT 15 17 20 23 23   ALKPHOS 52 60 75  84 80  BILITOT 1.0 0.7 0.8 0.8 0.6  PROT 6.6 7.0 7.3 7.2 7.0  ALBUMIN 2.9* 2.9* 3.0* 2.8* 2.7*   No results for input(s): LIPASE, AMYLASE in the last 168 hours. No results for input(s): AMMONIA in the last 168 hours. CBC: Recent Labs  Lab 02/06/20 0453 02/07/20 0544 02/08/20 0500 02/09/20 0421 02/10/20 0610  WBC 7.1 7.3 13.5* 17.0* 13.8*  NEUTROABS 5.5 5.5 11.1* 13.7* 9.5*  HGB 13.1 13.9 14.6 12.8 14.2  HCT 38.8 39.1 41.0 37.1 41.9  MCV 94.2 89.5 89.5 90.9 92.7  PLT 156 203 251 325 316   Cardiac Enzymes: No results for input(s): CKTOTAL, CKMB, CKMBINDEX, TROPONINI in the last 168 hours. BNP: Invalid input(s): POCBNP CBG: Recent Labs  Lab 02/10/20 1142 02/10/20 1544 02/10/20 2148 02/11/20 0753 02/11/20 1137  GLUCAP 269* 147* 162* 224* 192*   D-Dimer No results for input(s): DDIMER in the last 72 hours. Hgb A1c No results for input(s): HGBA1C in the last 72 hours. Lipid Profile No results for input(s): CHOL, HDL, LDLCALC, TRIG, CHOLHDL, LDLDIRECT in the last 72 hours. Thyroid function studies No results for input(s): TSH, T4TOTAL, T3FREE, THYROIDAB in the last 72 hours.  Invalid input(s): FREET3 Anemia work up Recent Labs    02/09/20 0421 02/10/20 0610  FERRITIN 1,143* 581*   Urinalysis    Component Value Date/Time   COLORURINE YELLOW (A) 02/04/2020 2025   APPEARANCEUR HAZY (A) 02/04/2020 2025   LABSPEC 1.030 02/04/2020 2025   PHURINE 5.0 02/04/2020 2025   GLUCOSEU >=500 (A) 02/04/2020 2025   HGBUR SMALL (A) 02/04/2020 2025   BILIRUBINUR NEGATIVE 02/04/2020 2025   BILIRUBINUR Positive 02/04/2020 1133   KETONESUR  20 (A) 02/04/2020 2025   PROTEINUR 100 (A) 02/04/2020 2025   UROBILINOGEN 0.2 02/04/2020 1133   NITRITE NEGATIVE 02/04/2020 2025   LEUKOCYTESUR NEGATIVE 02/04/2020 2025   Sepsis Labs Invalid input(s): PROCALCITONIN,  WBC,  LACTICIDVEN Microbiology Recent Results (from the past 240 hour(s))  CULTURE, URINE COMPREHENSIVE     Status: None   Collection Time: 02/04/20  1:52 PM   Specimen: Urine  Result Value Ref Range Status   MICRO NUMBER: [id number]  Final   SPECIMEN QUALITY: Adequate  Final   Source OTHER (SPECIFY)  Final   STATUS: FINAL  Final   RESULT: No Growth  Final  Blood culture (routine x 2)     Status: None   Collection Time: 02/04/20  8:25 PM   Specimen: BLOOD  Result Value Ref Range Status   Specimen Description BLOOD LEFT AC  Final   Special Requests   Final    BOTTLES DRAWN AEROBIC AND ANAEROBIC Blood Culture adequate volume   Culture   Final    NO GROWTH 5 DAYS Performed at [facility], [address] [place], [place] [address]    Report Status 02/09/2020 FINAL  Final  Urine Culture     Status: None   Collection Time: 02/04/20  8:48 PM   Specimen: Urine, Clean Catch  Result Value Ref Range Status   Specimen Description   Final    URINE, CLEAN CATCH Performed at [facility], [address], [place], [place] [address]    Special Requests NONE  Final   Culture   Final    NO GROWTH Performed at [facility], [address] [place], [place] [address]    Report Status 02/06/2020 FINAL  Final  SARS Coronavirus 2 by RT PCR (hospital order, performed in [facility] hospital  lab) Nasopharyngeal Nasopharyngeal Swab     Status: Abnormal   Collection Time: 02/05/20  4:59 AM   Specimen: Nasopharyngeal Swab  Result Value Ref Range Status   SARS Coronavirus 2 POSITIVE (A) NEGATIVE Final    Comment: RESULT CALLED TO, READ BACK BY AND VERIFIED WITH: ASHTON PETERS AT 0632 ON 02/05/2020 [facility]. (NOTE) SARS-CoV-2 target nucleic acids are  DETECTED  SARS-CoV-2 RNA is generally detectable in upper respiratory specimens  during the acute phase of infection.  Positive results are indicative  of the presence of the identified virus, but do not rule out bacterial infection or co-infection with other pathogens not detected by the test.  Clinical correlation with patient history and  other diagnostic information is necessary to determine patient infection status.  The expected result is negative.  Fact Sheet for Patients:   BoilerBrush.com.cy   Fact Sheet for Healthcare Providers:   https://pope.com/    This test is not yet approved or cleared by the [place] FDA and  has been authorized for detection and/or diagnosis of SARS-CoV-2 by FDA under an Emergency Use Authorization (EUA).  This EUA will remain in effect (meaning thi s test can be used) for the duration of  the COVID-19 declaration under Section 564(b)(1) of the Act, 21 U.S.C. section 360-bbb-3(b)(1), unless the authorization is terminated or revoked sooner.  Performed at [facility] [place], [address] [place], [place] [address]   Culture, blood (Routine X 2) w Reflex to ID Panel     Status: None   Collection Time: 02/05/20  5:09 AM   Specimen: BLOOD  Result Value Ref Range Status   Specimen Description BLOOD LEFT ANTECUBITAL  Final   Special Requests   Final    BOTTLES DRAWN AEROBIC AND ANAEROBIC Blood Culture adequate volume   Culture   Final    NO GROWTH 5 DAYS Performed at [facility], [address] [place], [place] [address]    Report Status 02/10/2020 FINAL  Final    Time coordinating discharge: Over 30 minutes  SIGNED:  Arnetha Courser, MD  [facility] 02/11/2020, 6:21 PM  If 7PM-7AM, please contact night-coverage [email or web address]  This record has been created using Conservation officer, historic buildings. Errors have been sought and corrected,but may not always be located.  Such creation errors do not reflect on the standard of care.

## 2020-02-11 NOTE — TOC Transition Note (Signed)
Transition of Care [facility]) - CM/SW Discharge Note   Patient Details  Name: Julie Wall MRN: [id number] Date of Birth: 1955-11-09  Transition of [facility]) CM/SW Contact:  Allayne Butcher, RN Phone Number: 02/11/2020, 10:54 AM   Clinical Narrative:    Patient has been medically cleared for discharge home with home health services and home O2.  Patient's daughter Julie Wall notified of discharge today also.  Julie Wall is currently at work but she says her brother Tresa Endo will be able to pick up the patient today at discharge.  Home Health arranged with Delila Spence with [facility] notified of discharge.  [employer] will provide home O2 and walker and deliver to the room before discharge.    Final next level of care: Home w Home Health Services Barriers to Discharge: Barriers Resolved   Patient Goals and CMS Choice Patient states their goals for this hospitalization and ongoing recovery are:: Hopefully can get off the oxygen before going home CMS Medicare.gov Compare Post Acute Care list provided to:: Patient Choice offered to / list presented to : Patient  Discharge Placement                       Discharge Plan and Services   Discharge Planning Services: CM Consult Post Acute Care Choice: Home Health          DME Arranged: Walker rolling, Oxygen DME Agency: [facility] Date DME Agency Contacted: 02/11/20 Time DME Agency Contacted: 1054 Representative spoke with at DME Agency: Oletha Cruel HH Arranged: PT, OT HH Agency: [facility] Date [facility] Agency Contacted: 02/11/20 Time HH Agency Contacted: 1054 Representative spoke with at [facility] Agency: Kandee Keen  Social Determinants of Health (SDOH) Interventions     Readmission Risk Interventions No flowsheet data found.

## 2020-02-11 NOTE — Progress Notes (Signed)
At rest on room air patient's oxygen saturation is 85%.  On 3L [place] at rest patient's oxygen is 96%.

## 2020-02-13 ENCOUNTER — Telehealth: Payer: Self-pay | Admitting: Family Medicine

## 2020-02-13 ENCOUNTER — Telehealth: Payer: Self-pay

## 2020-02-13 DIAGNOSIS — Z7984 Long term (current) use of oral hypoglycemic drugs: Secondary | ICD-10-CM | POA: Diagnosis not present

## 2020-02-13 DIAGNOSIS — M5417 Radiculopathy, lumbosacral region: Secondary | ICD-10-CM | POA: Diagnosis not present

## 2020-02-13 DIAGNOSIS — M8580 Other specified disorders of bone density and structure, unspecified site: Secondary | ICD-10-CM | POA: Diagnosis not present

## 2020-02-13 DIAGNOSIS — J1282 Pneumonia due to coronavirus disease 2019: Secondary | ICD-10-CM | POA: Diagnosis not present

## 2020-02-13 DIAGNOSIS — I10 Essential (primary) hypertension: Secondary | ICD-10-CM | POA: Diagnosis not present

## 2020-02-13 DIAGNOSIS — U071 COVID-19: Secondary | ICD-10-CM | POA: Diagnosis not present

## 2020-02-13 DIAGNOSIS — Z7952 Long term (current) use of systemic steroids: Secondary | ICD-10-CM | POA: Diagnosis not present

## 2020-02-13 DIAGNOSIS — Z79899 Other long term (current) drug therapy: Secondary | ICD-10-CM | POA: Diagnosis not present

## 2020-02-13 DIAGNOSIS — E119 Type 2 diabetes mellitus without complications: Secondary | ICD-10-CM | POA: Diagnosis not present

## 2020-02-13 DIAGNOSIS — E039 Hypothyroidism, unspecified: Secondary | ICD-10-CM | POA: Diagnosis not present

## 2020-02-13 DIAGNOSIS — Z9981 Dependence on supplemental oxygen: Secondary | ICD-10-CM | POA: Diagnosis not present

## 2020-02-13 DIAGNOSIS — I700 Atherosclerosis of aorta: Secondary | ICD-10-CM | POA: Diagnosis not present

## 2020-02-13 DIAGNOSIS — F17210 Nicotine dependence, cigarettes, uncomplicated: Secondary | ICD-10-CM | POA: Diagnosis not present

## 2020-02-13 DIAGNOSIS — Z7982 Long term (current) use of aspirin: Secondary | ICD-10-CM | POA: Diagnosis not present

## 2020-02-13 DIAGNOSIS — E785 Hyperlipidemia, unspecified: Secondary | ICD-10-CM | POA: Diagnosis not present

## 2020-02-13 DIAGNOSIS — J440 Chronic obstructive pulmonary disease with acute lower respiratory infection: Secondary | ICD-10-CM | POA: Diagnosis not present

## 2020-02-13 DIAGNOSIS — Z9181 History of falling: Secondary | ICD-10-CM | POA: Diagnosis not present

## 2020-02-13 DIAGNOSIS — K219 Gastro-esophageal reflux disease without esophagitis: Secondary | ICD-10-CM | POA: Diagnosis not present

## 2020-02-13 NOTE — Telephone Encounter (Signed)
Home Health Verbal Orders - Caller/Agency: [facility]  Callback Number: [phone]  Requesting /PT/Therapy: Twice a week for two weeks and once a week for two weeks.   Pt is on oxygen using a portable one.  Does she need a concentrator?Marland Kitchen  Amy wants to know if one has been ordered by the hospital or another provider?

## 2020-02-13 NOTE — Telephone Encounter (Signed)
Transition Care Management Follow-up Telephone Call  Date of discharge and from where: 02/10/09 [facility]  How have you been since you were released from the hospital? Pt states she is doing okay; denies SOB or difficulty breathing  Any questions or concerns? No   [facility] home health physical therapist at patient's home at time of call.   Items Reviewed:  Did the pt receive and understand the discharge instructions provided? Yes   Medications obtained and verified? Yes   Any new allergies since your discharge? No   Dietary orders reviewed? Yes  Do you have support at home? Yes   Functional Questionnaire: (I = Independent and D = Dependent) ADLs: I  Bathing/Dressing- I  Meal Prep- I  Eating- I  Maintaining continence- I  Transferring/Ambulation- I  Managing Meds- I  Follow up appointments reviewed:   PCP Hospital f/u appt confirmed? Yes  Scheduled to see Dr. Carlynn Purl for telephone visit on 02/26/20 @ 11:00.  Are transportation arrangements needed? No   If their condition worsens, is the pt aware to call PCP or go to the Emergency Dept.? Yes  Was the patient provided with contact information for the PCP's office or ED? Yes  Was to pt encouraged to call back with questions or concerns? Yes

## 2020-02-16 DIAGNOSIS — E119 Type 2 diabetes mellitus without complications: Secondary | ICD-10-CM | POA: Diagnosis not present

## 2020-02-16 DIAGNOSIS — J1282 Pneumonia due to coronavirus disease 2019: Secondary | ICD-10-CM | POA: Diagnosis not present

## 2020-02-16 DIAGNOSIS — Z9981 Dependence on supplemental oxygen: Secondary | ICD-10-CM | POA: Diagnosis not present

## 2020-02-16 DIAGNOSIS — Z79899 Other long term (current) drug therapy: Secondary | ICD-10-CM | POA: Diagnosis not present

## 2020-02-16 DIAGNOSIS — Z7952 Long term (current) use of systemic steroids: Secondary | ICD-10-CM | POA: Diagnosis not present

## 2020-02-16 DIAGNOSIS — I10 Essential (primary) hypertension: Secondary | ICD-10-CM | POA: Diagnosis not present

## 2020-02-16 DIAGNOSIS — K219 Gastro-esophageal reflux disease without esophagitis: Secondary | ICD-10-CM | POA: Diagnosis not present

## 2020-02-16 DIAGNOSIS — I700 Atherosclerosis of aorta: Secondary | ICD-10-CM | POA: Diagnosis not present

## 2020-02-16 DIAGNOSIS — E785 Hyperlipidemia, unspecified: Secondary | ICD-10-CM | POA: Diagnosis not present

## 2020-02-16 DIAGNOSIS — M5417 Radiculopathy, lumbosacral region: Secondary | ICD-10-CM | POA: Diagnosis not present

## 2020-02-16 DIAGNOSIS — Z9181 History of falling: Secondary | ICD-10-CM | POA: Diagnosis not present

## 2020-02-16 DIAGNOSIS — Z7982 Long term (current) use of aspirin: Secondary | ICD-10-CM | POA: Diagnosis not present

## 2020-02-16 DIAGNOSIS — M8580 Other specified disorders of bone density and structure, unspecified site: Secondary | ICD-10-CM | POA: Diagnosis not present

## 2020-02-16 DIAGNOSIS — J440 Chronic obstructive pulmonary disease with acute lower respiratory infection: Secondary | ICD-10-CM | POA: Diagnosis not present

## 2020-02-16 DIAGNOSIS — E039 Hypothyroidism, unspecified: Secondary | ICD-10-CM | POA: Diagnosis not present

## 2020-02-16 DIAGNOSIS — F17210 Nicotine dependence, cigarettes, uncomplicated: Secondary | ICD-10-CM | POA: Diagnosis not present

## 2020-02-16 DIAGNOSIS — U071 COVID-19: Secondary | ICD-10-CM | POA: Diagnosis not present

## 2020-02-16 DIAGNOSIS — Z7984 Long term (current) use of oral hypoglycemic drugs: Secondary | ICD-10-CM | POA: Diagnosis not present

## 2020-02-16 NOTE — Telephone Encounter (Signed)
Pt is on oxygen using a portable one.  Does she need a concentrator?Marland Kitchen  Amy wants to know if one has been ordered by the hospital or another provider?

## 2020-02-17 DIAGNOSIS — E785 Hyperlipidemia, unspecified: Secondary | ICD-10-CM | POA: Diagnosis not present

## 2020-02-17 DIAGNOSIS — J1282 Pneumonia due to coronavirus disease 2019: Secondary | ICD-10-CM | POA: Diagnosis not present

## 2020-02-17 DIAGNOSIS — Z7984 Long term (current) use of oral hypoglycemic drugs: Secondary | ICD-10-CM | POA: Diagnosis not present

## 2020-02-17 DIAGNOSIS — Z7982 Long term (current) use of aspirin: Secondary | ICD-10-CM | POA: Diagnosis not present

## 2020-02-17 DIAGNOSIS — F17210 Nicotine dependence, cigarettes, uncomplicated: Secondary | ICD-10-CM | POA: Diagnosis not present

## 2020-02-17 DIAGNOSIS — E039 Hypothyroidism, unspecified: Secondary | ICD-10-CM | POA: Diagnosis not present

## 2020-02-17 DIAGNOSIS — Z9181 History of falling: Secondary | ICD-10-CM | POA: Diagnosis not present

## 2020-02-17 DIAGNOSIS — U071 COVID-19: Secondary | ICD-10-CM | POA: Diagnosis not present

## 2020-02-17 DIAGNOSIS — I10 Essential (primary) hypertension: Secondary | ICD-10-CM | POA: Diagnosis not present

## 2020-02-17 DIAGNOSIS — I700 Atherosclerosis of aorta: Secondary | ICD-10-CM | POA: Diagnosis not present

## 2020-02-17 DIAGNOSIS — J440 Chronic obstructive pulmonary disease with acute lower respiratory infection: Secondary | ICD-10-CM | POA: Diagnosis not present

## 2020-02-17 DIAGNOSIS — Z79899 Other long term (current) drug therapy: Secondary | ICD-10-CM | POA: Diagnosis not present

## 2020-02-17 DIAGNOSIS — Z7952 Long term (current) use of systemic steroids: Secondary | ICD-10-CM | POA: Diagnosis not present

## 2020-02-17 DIAGNOSIS — E119 Type 2 diabetes mellitus without complications: Secondary | ICD-10-CM | POA: Diagnosis not present

## 2020-02-17 DIAGNOSIS — M5417 Radiculopathy, lumbosacral region: Secondary | ICD-10-CM | POA: Diagnosis not present

## 2020-02-17 DIAGNOSIS — K219 Gastro-esophageal reflux disease without esophagitis: Secondary | ICD-10-CM | POA: Diagnosis not present

## 2020-02-17 DIAGNOSIS — M8580 Other specified disorders of bone density and structure, unspecified site: Secondary | ICD-10-CM | POA: Diagnosis not present

## 2020-02-17 DIAGNOSIS — Z9981 Dependence on supplemental oxygen: Secondary | ICD-10-CM | POA: Diagnosis not present

## 2020-02-19 ENCOUNTER — Ambulatory Visit: Payer: Medicare Other | Admitting: Pharmacist

## 2020-02-19 ENCOUNTER — Other Ambulatory Visit: Payer: Self-pay

## 2020-02-19 DIAGNOSIS — M5417 Radiculopathy, lumbosacral region: Secondary | ICD-10-CM | POA: Diagnosis not present

## 2020-02-19 DIAGNOSIS — E039 Hypothyroidism, unspecified: Secondary | ICD-10-CM | POA: Diagnosis not present

## 2020-02-19 DIAGNOSIS — J1282 Pneumonia due to coronavirus disease 2019: Secondary | ICD-10-CM | POA: Diagnosis not present

## 2020-02-19 DIAGNOSIS — M8580 Other specified disorders of bone density and structure, unspecified site: Secondary | ICD-10-CM | POA: Diagnosis not present

## 2020-02-19 DIAGNOSIS — J440 Chronic obstructive pulmonary disease with acute lower respiratory infection: Secondary | ICD-10-CM | POA: Diagnosis not present

## 2020-02-19 DIAGNOSIS — E785 Hyperlipidemia, unspecified: Secondary | ICD-10-CM

## 2020-02-19 DIAGNOSIS — E119 Type 2 diabetes mellitus without complications: Secondary | ICD-10-CM | POA: Diagnosis not present

## 2020-02-19 DIAGNOSIS — F17210 Nicotine dependence, cigarettes, uncomplicated: Secondary | ICD-10-CM | POA: Diagnosis not present

## 2020-02-19 DIAGNOSIS — Z720 Tobacco use: Secondary | ICD-10-CM

## 2020-02-19 DIAGNOSIS — Z9981 Dependence on supplemental oxygen: Secondary | ICD-10-CM | POA: Diagnosis not present

## 2020-02-19 DIAGNOSIS — Z7982 Long term (current) use of aspirin: Secondary | ICD-10-CM | POA: Diagnosis not present

## 2020-02-19 DIAGNOSIS — I700 Atherosclerosis of aorta: Secondary | ICD-10-CM | POA: Diagnosis not present

## 2020-02-19 DIAGNOSIS — Z7952 Long term (current) use of systemic steroids: Secondary | ICD-10-CM | POA: Diagnosis not present

## 2020-02-19 DIAGNOSIS — Z7984 Long term (current) use of oral hypoglycemic drugs: Secondary | ICD-10-CM | POA: Diagnosis not present

## 2020-02-19 DIAGNOSIS — Z9181 History of falling: Secondary | ICD-10-CM | POA: Diagnosis not present

## 2020-02-19 DIAGNOSIS — U071 COVID-19: Secondary | ICD-10-CM | POA: Diagnosis not present

## 2020-02-19 DIAGNOSIS — Z79899 Other long term (current) drug therapy: Secondary | ICD-10-CM | POA: Diagnosis not present

## 2020-02-19 DIAGNOSIS — I10 Essential (primary) hypertension: Secondary | ICD-10-CM | POA: Diagnosis not present

## 2020-02-19 DIAGNOSIS — K219 Gastro-esophageal reflux disease without esophagitis: Secondary | ICD-10-CM | POA: Diagnosis not present

## 2020-02-19 NOTE — Chronic Care Management (AMB) (Signed)
Chronic Care Management Pharmacy  Name: Julie Wall  MRN: [id number] DOB: May 21, 1956  Chief Complaint/ HPI  Julie Wall,  64 y.o. , female presents for their Follow-Up CCM visit with the clinical pharmacist via telephone due to COVID-19 Pandemic.  PCP : Julie Sizer, MD  Their chronic conditions include: DM, HTN, HLD  Office Visits:NA  Consult Visit: 9/2 Covid-19, [facility], Acute resp failure, 8 day stay  Medications: Outpatient Encounter Medications as of 02/19/2020  Medication Sig  . albuterol (VENTOLIN HFA) 108 (90 Base) MCG/ACT inhaler Inhale 2 puffs into the lungs every 6 (six) hours as needed for wheezing or shortness of breath.  Marland Kitchen ascorbic acid (VITAMIN C) 500 MG tablet Take 1 tablet (500 mg total) by mouth daily.  Marland Kitchen aspirin 81 MG chewable tablet Chew 1 tablet by mouth daily.  Marland Kitchen atorvastatin (LIPITOR) 40 MG tablet Take 1 tablet (40 mg total) by mouth daily. In place of simvastatin  . cholecalciferol (VITAMIN D3) 25 MCG (1000 UNIT) tablet Take 1 tablet (1,000 Units total) by mouth daily.  . dorzolamide-timolol (COSOPT) 22.3-6.8 MG/ML ophthalmic solution Place 1 drop into both eyes 2 (two) times daily.   . empagliflozin (JARDIANCE) 25 MG TABS tablet Take 1 tablet (25 mg total) by mouth daily.  . folic acid (FOLVITE) 1 MG tablet Take 1 mg by mouth daily.   Marland Kitchen gabapentin (NEURONTIN) 100 MG capsule Take 200 mg by mouth 3 (three) times daily.  Marland Kitchen glucose blood test strip Check blood sugar once daily fasting, and up to 4 times daily PRN (Patient taking differently: Check blood sugar once daily fasting, and up to 4 times daily PRN ACCU CHEK smartview)  . guaiFENesin-dextromethorphan (ROBITUSSIN DM) 100-10 MG/5ML syrup Take 10 mLs by mouth every 4 (four) hours as needed for cough.  Marland Kitchen HYDROcodone-acetaminophen (NORCO/VICODIN) 5-325 MG tablet Take 2 tablets by mouth 2 (two) times daily as needed for moderate pain.  Elmore Guise Devices (SIMPLE DIAGNOSTICS LANCING DEV) MISC Check blood sugar  once daily fasting, and up to 4 times daily PRN.  Marland Kitchen losartan (COZAAR) 25 MG tablet Take 0.5 tablets (12.5 mg total) by mouth daily. In place of lisinopril  . metFORMIN (GLUCOPHAGE-XR) 750 MG 24 hr tablet Take 2 tablets (1,500 mg total) by mouth daily with breakfast.  . methotrexate (RHEUMATREX) 2.5 MG tablet Take 15 mg by mouth every Friday.   Marland Kitchen rOPINIRole (REQUIP XL) 4 MG 24 hr tablet Take 4 mg by mouth at bedtime.   . triamcinolone ointment (KENALOG) 0.1 % Apply topically 2 (two) times daily.  Marland Kitchen umeclidinium-vilanterol (ANORO ELLIPTA) 62.5-25 MCG/INH AEPB Inhale 1 puff into the lungs daily.  Marland Kitchen zinc sulfate 220 (50 Zn) MG capsule Take 1 capsule (220 mg total) by mouth daily.   No facility-administered encounter medications on file as of 02/19/2020.     Financial Resource Strain: Low Risk   . Difficulty of Paying Living Expenses: Not very hard     Current Diagnosis/Assessment:  Goals Addressed            This Visit's Progress   . Tobacco abuse - goal taper to cessation       CARE PLAN ENTRY (see longitudinal plan of care for additional care plan information)  Current Barriers:  . Tobacco abuse of 46 years; currently smoking 0.5 ppd, stopped after Covid-19 . Previous quit attempts, unsuccessful . Denies smoking within 30 minutes of waking up . Reports triggers to smoke include: family stress . Reports motivation to quit smoking includes: she is  strong willed and confident . On a scale of 1-10, reports MOTIVATION to quit is 7 . On a scale of 1-10, reports CONFIDENCE in quitting is 10  Pharmacist Clinical Goal(s):  Marland Kitchen Over the next 30 days, patient will work with PharmD and provider towards continued tobacco cessation  Interventions: . Comprehensive medication review performed, medication list in electronic medical record updated . Inter-disciplinary care team collaboration (see longitudinal plan of care) . Has not smoked since Covid-19 hospital stay 02/05/20   Patient Self  Care Activities:  . Call [email or web address] for post quitting support  Initial goal documentation       Hypertension   BP goal is:  <130/80  Office blood pressures are  BP Readings from Last 3 Encounters:  02/11/20 116/77  11/05/19 126/72  10/06/19 (!) 100/50   Patient checks BP at home infrequently Patient home BP readings are ranging: NA  Patient has failed these meds in the past: NA Patient is currently controlled on the following medications:  . None  We discussed: At goal Denies hypotension  Plan  Continue control with diet and exercise   Hyperlipidemia   LDL goal < 70  Lipid Panel     Component Value Date/Time   CHOL 91 09/05/2019 1108   CHOL 119 04/02/2015 0956   TRIG 56 09/05/2019 1108   HDL 45 (L) 09/05/2019 1108   HDL 49 04/02/2015 0956   LDLCALC 32 09/05/2019 1108    Hepatic Function Latest Ref Rng & Units 02/10/2020 02/09/2020 02/08/2020  Total Protein 6.5 - 8.1 g/dL 7.0 7.2 7.3  Albumin 3.5 - 5.0 g/dL 2.7(L) 2.8(L) 3.0(L)  AST 15 - 41 U/L 27 41 44(H)  ALT 0 - 44 U/L 23 23 20   Alk Phosphatase 38 - 126 U/L 80 84 75  Total Bilirubin 0.3 - 1.2 mg/dL 0.6 0.8 0.8     The ASCVD Risk score (Russell., et al., 2013) failed to calculate for the following reasons:   The valid total cholesterol range is 130 to 320 mg/dL   Patient has failed these meds in past: NA Patient is currently controlled on the following medications:  . Lipitor 2m daily  We discussed:  At goal Denies myalgias  Plan  Continue current medications  Tobacco Abuse   Tobacco Status:  Social History   Tobacco Use  Smoking Status Current Every Day Smoker  . Packs/day: 0.50  . Years: 46.00  . Pack years: 23.00  . Types: Cigarettes  . Start date: 01/07/1985  Smokeless Tobacco Never Used  Tobacco Comment   [place] smoking cessation program information given     Previous quit attempts included: 02/05/20 Patient is currently controlled on the following medications:   . None  We discussed: Has not smoked since leaving the hospital, following Covid-19 acute respiratory failure  Plan  Call [email or web address] for post quitting support

## 2020-02-19 NOTE — Patient Instructions (Addendum)
Visit Information  Goals Addressed            This Visit's Progress   . Tobacco abuse - goal taper to cessation       CARE PLAN ENTRY (see longitudinal plan of care for additional care plan information)  Current Barriers:  . Tobacco abuse of 46 years; currently smoking 0.5 ppd, stopped after Covid-19 . Previous quit attempts, unsuccessful . Denies smoking within 30 minutes of waking up . Reports triggers to smoke include: family stress . Reports motivation to quit smoking includes: she is strong willed and confident . On a scale of 1-10, reports MOTIVATION to quit is 7 . On a scale of 1-10, reports CONFIDENCE in quitting is 10  Pharmacist Clinical Goal(s):  Marland Kitchen Over the next 30 days, patient will work with PharmD and provider towards continued tobacco cessation  Interventions: . Comprehensive medication review performed, medication list in electronic medical record updated . Inter-disciplinary care team collaboration (see longitudinal plan of care) . Has not smoked since Covid-19 hospital stay 02/05/20   Patient Self Care Activities:  . Call [email or web address] for post quitting support  Initial goal documentation        Print copy of patient instructions provided.   Telephone follow up appointment with pharmacy team member scheduled for:  Felton Clinton, PharmD, [place], CTTS Clinical Pharmacist [facility] [phone]  Steps to Quit Smoking Smoking tobacco is the leading cause of preventable death. It can affect almost every organ in the body. Smoking puts you and people around you at risk for many serious, [place]-lasting (chronic) diseases. Quitting smoking can be hard, but it is one of the best things that you can do for your health. It is never too late to quit. How do I get ready to quit? When you decide to quit smoking, make a plan to help you succeed. Before you quit:  Pick a date to quit. Set a date within the next 2 weeks to give you time to prepare.  Write  down the reasons why you are quitting. Keep this list in places where you will see it often.  Tell your family, friends, and co-workers that you are quitting. Their support is important.  Talk with your doctor about the choices that may help you quit.  Find out if your health insurance will pay for these treatments.  Know the people, places, things, and activities that make you want to smoke (triggers). Avoid them. What first steps can I take to quit smoking?  Throw away all cigarettes at home, at work, and in your car.  Throw away the things that you use when you smoke, such as ashtrays and lighters.  Clean your car. Make sure to empty the ashtray.  Clean your home, including curtains and carpets. What can I do to help me quit smoking? Talk with your doctor about taking medicines and seeing a counselor at the same time. You are more likely to succeed when you do both.  If you are pregnant or breastfeeding, talk with your doctor about counseling or other ways to quit smoking. Do not take medicine to help you quit smoking unless your doctor tells you to do so. To quit smoking: Quit right away  Quit smoking totally, instead of slowly cutting back on how much you smoke over a period of time.  Go to counseling. You are more likely to quit if you go to counseling sessions regularly. Take medicine You may take medicines to help you quit. Some medicines  need a prescription, and some you can buy over-the-counter. Some medicines may contain a drug called nicotine to replace the nicotine in cigarettes. Medicines may:  Help you to stop having the desire to smoke (cravings).  Help to stop the problems that come when you stop smoking (withdrawal symptoms). Your doctor may ask you to use:  Nicotine patches, gum, or lozenges.  Nicotine inhalers or sprays.  Non-nicotine medicine that is taken by mouth. Find resources Find resources and other ways to help you quit smoking and remain  smoke-free after you quit. These resources are most helpful when you use them often. They include:  Online chats with a Veterinary surgeon.  Phone quitlines.  Printed Materials engineer.  Support groups or group counseling.  Text messaging programs.  Mobile phone apps. Use apps on your mobile phone or tablet that can help you stick to your quit plan. There are many free apps for mobile phones and tablets as well as websites. Examples include Quit Guide from the [employer] and smokefree.gov  What things can I do to make it easier to quit?   Talk to your family and friends. Ask them to support and encourage you.  Call a phone quitline (1-800-QUIT-NOW), reach out to support groups, or work with a Veterinary surgeon.  Ask people who smoke to not smoke around you.  Avoid places that make you want to smoke, such as: ? Bars. ? Parties. ? Smoke-break areas at work.  Spend time with people who do not smoke.  Lower the stress in your life. Stress can make you want to smoke. Try these things to help your stress: ? Getting regular exercise. ? Doing deep-breathing exercises. ? Doing yoga. ? Meditating. ? Doing a body scan. To do this, close your eyes, focus on one area of your body at a time from head to toe. Notice which parts of your body are tense. Try to relax the muscles in those areas. How will I feel when I quit smoking? Day 1 to 3 weeks Within the first 24 hours, you may start to have some problems that come from quitting tobacco. These problems are very bad 2-3 days after you quit, but they do not often last for more than 2-3 weeks. You may get these symptoms:  Mood swings.  Feeling restless, nervous, angry, or annoyed.  Trouble concentrating.  Dizziness.  Strong desire for high-sugar foods and nicotine.  Weight gain.  Trouble pooping (constipation).  Feeling like you may vomit (nausea).  Coughing or a sore throat.  Changes in how the medicines that you take for other issues work in  your body.  Depression.  Trouble sleeping (insomnia). Week 3 and afterward After the first 2-3 weeks of quitting, you may start to notice more positive results, such as:  Better sense of smell and taste.  Less coughing and sore throat.  Slower heart rate.  Lower blood pressure.  Clearer skin.  Better breathing.  Fewer sick days. Quitting smoking can be hard. Do not give up if you fail the first time. Some people need to try a few times before they succeed. Do your best to stick to your quit plan, and talk with your doctor if you have any questions or concerns. Summary  Smoking tobacco is the leading cause of preventable death. Quitting smoking can be hard, but it is one of the best things that you can do for your health.  When you decide to quit smoking, make a plan to help you succeed.  Quit smoking  right away, not slowly over a period of time.  When you start quitting, seek help from your doctor, family, or friends. This information is not intended to replace advice given to you by your health care provider. Make sure you discuss any questions you have with your health care provider. Document Revised: 02/14/2019 Document Reviewed: 08/10/2018 Elsevier Patient Education  2020 [employer].

## 2020-02-24 DIAGNOSIS — M5417 Radiculopathy, lumbosacral region: Secondary | ICD-10-CM | POA: Diagnosis not present

## 2020-02-24 DIAGNOSIS — Z7982 Long term (current) use of aspirin: Secondary | ICD-10-CM | POA: Diagnosis not present

## 2020-02-24 DIAGNOSIS — Z9981 Dependence on supplemental oxygen: Secondary | ICD-10-CM | POA: Diagnosis not present

## 2020-02-24 DIAGNOSIS — E039 Hypothyroidism, unspecified: Secondary | ICD-10-CM | POA: Diagnosis not present

## 2020-02-24 DIAGNOSIS — I10 Essential (primary) hypertension: Secondary | ICD-10-CM | POA: Diagnosis not present

## 2020-02-24 DIAGNOSIS — K219 Gastro-esophageal reflux disease without esophagitis: Secondary | ICD-10-CM | POA: Diagnosis not present

## 2020-02-24 DIAGNOSIS — F17210 Nicotine dependence, cigarettes, uncomplicated: Secondary | ICD-10-CM | POA: Diagnosis not present

## 2020-02-24 DIAGNOSIS — U071 COVID-19: Secondary | ICD-10-CM | POA: Diagnosis not present

## 2020-02-24 DIAGNOSIS — E119 Type 2 diabetes mellitus without complications: Secondary | ICD-10-CM | POA: Diagnosis not present

## 2020-02-24 DIAGNOSIS — J1282 Pneumonia due to coronavirus disease 2019: Secondary | ICD-10-CM | POA: Diagnosis not present

## 2020-02-24 DIAGNOSIS — M8580 Other specified disorders of bone density and structure, unspecified site: Secondary | ICD-10-CM | POA: Diagnosis not present

## 2020-02-24 DIAGNOSIS — Z79899 Other long term (current) drug therapy: Secondary | ICD-10-CM | POA: Diagnosis not present

## 2020-02-24 DIAGNOSIS — Z7984 Long term (current) use of oral hypoglycemic drugs: Secondary | ICD-10-CM | POA: Diagnosis not present

## 2020-02-24 DIAGNOSIS — H168 Other keratitis: Secondary | ICD-10-CM | POA: Diagnosis not present

## 2020-02-24 DIAGNOSIS — Z7952 Long term (current) use of systemic steroids: Secondary | ICD-10-CM | POA: Diagnosis not present

## 2020-02-24 DIAGNOSIS — Z9181 History of falling: Secondary | ICD-10-CM | POA: Diagnosis not present

## 2020-02-24 DIAGNOSIS — J440 Chronic obstructive pulmonary disease with acute lower respiratory infection: Secondary | ICD-10-CM | POA: Diagnosis not present

## 2020-02-24 DIAGNOSIS — I700 Atherosclerosis of aorta: Secondary | ICD-10-CM | POA: Diagnosis not present

## 2020-02-24 DIAGNOSIS — E785 Hyperlipidemia, unspecified: Secondary | ICD-10-CM | POA: Diagnosis not present

## 2020-02-25 DIAGNOSIS — H168 Other keratitis: Secondary | ICD-10-CM | POA: Diagnosis not present

## 2020-02-25 NOTE — Progress Notes (Signed)
Name: Julie Wall   MRN: [id number]    DOB: 10/28/1955   Date:02/26/2020       Progress Note  Subjective:    Chief Complaint  Chief Complaint  Patient presents with  . Hospitalization Follow-up    I connected with  Anneka Mukherjee on 02/26/20 at 11:00 AM EDT by telephone and verified that I am speaking with the correct person using two identifiers.  I discussed the limitations, risks, security and privacy concerns of performing an evaluation and management service by telephone and the availability of in person appointments. Staff also discussed with the patient that there may be a patient responsible charge related to this service. Patient Location: at home  Provider Location: [facility]   HPI  Hospital admission date: 02/05/2020 Discharge date: 02/11/2020 Discharge diagnosis: COVID-19 pneumonia with hypoxia requiring supplemental oxygen  Hospital stay: she was seen in our office on with complaints of flank pain, fever, body aches for about 5 days, she was also having wheezing, cough and nurse noticed she was clammy we switched to a virtual visit and after assessment she was advised to go to [facility]. [facility] her pulse ox was 86 % and required oxygen supplementation .COVID-19 test was positive,  CT chest was suggestive of bilateral infiltrates and she was admitted. EKG showed sinus tachycardia, platelets was low, liver enzymes slightly elevated. D-dimer was very high, ferritin also high , albumin dropped during her stay . She received a course of Remdesivir , also Barcitinib was started but stopped before 14 days due to insurance coverage. She was sent home on steroids and oxygen, an order was placed for home health, she had home PT, she is feeling stronger walking outside . Supplements added Zinc, vitamin D and vitamin C. She states finished prednisone , denies sob , cough or wheezing, she states pulse ox around 93-97 % without oxygen since last week,   We will check CBC and CMP as  recommended upon discharge    Patient Active Problem List   Diagnosis Date Noted  . Acute respiratory failure with hypoxemia (HCC)   . Hypotension   . Diabetic polyneuropathy associated with type 2 diabetes mellitus (HCC)   . Weakness   . Pneumonia due to COVID-19 virus 02/05/2020  . Hilar lymphadenopathy 02/05/2020  . Morbid obesity (HCC) 06/26/2018  . Atherosclerosis of aorta (HCC) 05/04/2017  . Pap smear abnormality of cervix with ASCUS favoring benign 04/23/2017  . Vitamin D deficiency 12/14/2016  . Obesity (BMI 30.0-34.9) 12/13/2015  . Chronic bronchitis (HCC) 05/14/2015  . Diabetes mellitus (HCC) 05/14/2015  . Positive H. pylori test 04/05/2015  . Rapid urease test for Helicobacter pylori infection positive 04/05/2015  . Umbilical hernia 04/01/2015  . Exomphalos 04/01/2015  . RLS (restless legs syndrome) 01/08/2015  . Type 2 diabetes mellitus with hyperlipidemia (HCC) 01/08/2015  . Neurogenic claudication 01/08/2015  . AD (atopic dermatitis) 12/28/2014  . Chronic LBP 12/28/2014  . Dyslipidemia 12/28/2014  . Gastroesophageal reflux disease without esophagitis 12/28/2014  . Microalbuminuria 12/28/2014  . Disorder of bone and cartilage 12/28/2014  . Neuralgia of left thigh 12/28/2014  . Perennial allergic rhinitis with seasonal variation 12/28/2014  . Tobacco abuse 12/28/2014  . Mononeuropathy of left lower extremity 12/28/2014  . Hypothyroidism 09/22/2005  . Hypertension, benign 09/22/2005    Current Outpatient Medications:  .  albuterol (VENTOLIN HFA) 108 (90 Base) MCG/ACT inhaler, Inhale 2 puffs into the lungs every 6 (six) hours as needed for wheezing or shortness of breath., Disp:  18 g, Rfl: 0 .  ascorbic acid (VITAMIN C) 500 MG tablet, Take 1 tablet (500 mg total) by mouth daily., Disp: 30 tablet, Rfl: 2 .  aspirin 81 MG chewable tablet, Chew 1 tablet by mouth daily., Disp: , Rfl:  .  atorvastatin (LIPITOR) 40 MG tablet, Take 1 tablet (40 mg total) by mouth daily.  In place of simvastatin, Disp: 90 tablet, Rfl: 1 .  cholecalciferol (VITAMIN D3) 25 MCG (1000 UNIT) tablet, Take 1 tablet (1,000 Units total) by mouth daily., Disp: 90 tablet, Rfl: 1 .  dorzolamide-timolol (COSOPT) 22.3-6.8 MG/ML ophthalmic solution, Place 1 drop into both eyes 2 (two) times daily. , Disp: , Rfl:  .  empagliflozin (JARDIANCE) 25 MG TABS tablet, Take 1 tablet (25 mg total) by mouth daily., Disp: 90 tablet, Rfl: 1 .  folic acid (FOLVITE) 1 MG tablet, Take 1 mg by mouth daily. , Disp: , Rfl:  .  gabapentin (NEURONTIN) 100 MG capsule, Take 200 mg by mouth 3 (three) times daily., Disp: , Rfl:  .  glucose blood test strip, Check blood sugar once daily fasting, and up to 4 times daily PRN (Patient taking differently: Check blood sugar once daily fasting, and up to 4 times daily PRN ACCU CHEK smartview), Disp: 100 each, Rfl: 2 .  guaiFENesin-dextromethorphan (ROBITUSSIN DM) 100-10 MG/5ML syrup, Take 10 mLs by mouth every 4 (four) hours as needed for cough., Disp: 118 mL, Rfl: 0 .  HYDROcodone-acetaminophen (NORCO/VICODIN) 5-325 MG tablet, Take 2 tablets by mouth 2 (two) times daily as needed for moderate pain., Disp: 30 tablet, Rfl: 0 .  Lancet Devices (SIMPLE DIAGNOSTICS LANCING DEV) MISC, Check blood sugar once daily fasting, and up to 4 times daily PRN., Disp: 100 each, Rfl: 2 .  losartan (COZAAR) 25 MG tablet, Take 0.5 tablets (12.5 mg total) by mouth daily. In place of lisinopril, Disp: 45 tablet, Rfl: 0 .  metFORMIN (GLUCOPHAGE-XR) 750 MG 24 hr tablet, Take 2 tablets (1,500 mg total) by mouth daily with breakfast., Disp: 180 tablet, Rfl: 1 .  methotrexate (RHEUMATREX) 2.5 MG tablet, Take 15 mg by mouth every Friday. , Disp: , Rfl:  .  rOPINIRole (REQUIP XL) 4 MG 24 hr tablet, Take 4 mg by mouth at bedtime. , Disp: , Rfl:  .  triamcinolone ointment (KENALOG) 0.1 %, Apply topically 2 (two) times daily., Disp: 30 g, Rfl: 0 .  umeclidinium-vilanterol (ANORO ELLIPTA) 62.5-25 MCG/INH AEPB,  Inhale 1 puff into the lungs daily., Disp: 180 each, Rfl: 2 .  zinc sulfate 220 (50 Zn) MG capsule, Take 1 capsule (220 mg total) by mouth daily., Disp: 30 capsule, Rfl: 2 Allergies  Allergen Reactions  . Penicillins Rash    Past Surgical History:  Procedure Laterality Date  . CATARACT EXTRACTION Bilateral 1980  . CESAREAN SECTION    . CORNEAL TRANSPLANT Left    [facility]-Treatment for blindness.   Family History  Problem Relation Age of Onset  . Diabetes Mother   . Hypertension Mother   . Pancreatic cancer Mother   . Alcohol abuse Father   . Arthritis/Rheumatoid Sister   . Diabetes Sister   . Kidney disease Sister   . Diabetes Brother   . Breast cancer Neg Hx    Social History   Socioeconomic History  . Marital status: Single    Spouse name: Not on file  . Number of children: 3  . Years of education: Not on file  . Highest education level: High school graduate  Occupational History  .  Not on file  Tobacco Use  . Smoking status: Current Every Day Smoker    Packs/day: 0.50    Years: 46.00    Pack years: 23.00    Types: Cigarettes    Start date: 01/07/1985  . Smokeless tobacco: Never Used  . Tobacco comment: [facility] smoking cessation program information given  Vaping Use  . Vaping Use: Never used  Substance and Sexual Activity  . Alcohol use: No    Alcohol/week: 0.0 standard drinks  . Drug use: No  . Sexual activity: Not Currently  Other Topics Concern  . Not on file  Social History Narrative   Disabled from severe atopic dermatitis   Has 3 children   Lives alone but very connect with family ( sees sister and her children daily)   Also belongs to a church   Social Determinants of Health   Financial Resource Strain: Low Risk   . Difficulty of Paying Living Expenses: Not very hard  Food Insecurity: No Food Insecurity  . Worried About Programme researcher, broadcasting/film/video in the Last Year: Never true  . Ran Out of Food in the Last Year: Never true  Transportation Needs: No  Transportation Needs  . Lack of Transportation (Medical): No  . Lack of Transportation (Non-Medical): No  Physical Activity: Insufficiently Active  . Days of Exercise per Week: 3 days  . Minutes of Exercise per Session: 40 min  Stress: No Stress Concern Present  . Feeling of Stress : Not at all  Social Connections: Moderately Integrated  . Frequency of Communication with Friends and Family: More than three times a week  . Frequency of Social Gatherings with Friends and Family: More than three times a week  . Attends Religious Services: More than 4 times per year  . Active Member of Clubs or Organizations: Yes  . Attends Banker Meetings: More than 4 times per year  . Marital Status: Never married  Intimate Partner Violence: Not At Risk  . Fear of Current or Ex-Partner: No  . Emotionally Abused: No  . Physically Abused: No  . Sexually Abused: No    Chart Review Today:  Review of Systems  Ten systems reviewed and is negative except as mentioned in HPI    Objective:    Virtual encounter, vitals limited, only able to obtain the following: There were no vitals filed for this visit. Nursing Note and Vital Signs reviewed.  Physical Exam  PE limited by telephone encounter Awake, alert and oriented   PHQ2/9: Depression screen [facility] 2/9 02/04/2020 11/05/2019 10/06/2019 09/05/2019 06/12/2019  Decreased Interest 0 0 0 0 0  Down, Depressed, Hopeless 0 0 0 0 0  PHQ - 2 Score 0 0 0 0 0  Altered sleeping - 0 0 0 -  Tired, decreased energy - 0 0 0 -  Change in appetite - 0 0 0 -  Feeling bad or failure about yourself  - 0 0 0 -  Trouble concentrating - 0 0 0 -  Moving slowly or fidgety/restless - 0 0 0 -  Suicidal thoughts - 0 0 0 -  PHQ-9 Score - 0 0 0 -  Difficult doing work/chores - Not difficult at all - - -  Some recent data might be hidden   PHQ-2/9 Result is negative   Fall Risk: Fall Risk  02/04/2020 11/05/2019 10/06/2019 09/05/2019 06/12/2019  Falls in the past year? 0 0 0  0 1  Number falls in past yr: 0 0 0 0  1  Injury with Fall? 0 0 0 0 0  Comment - - - - -  Risk Factor Category  - - - - -  Risk for fall due to : - - - - History of fall(s)  Risk for fall due to: Comment - - - - -  Follow up - Falls evaluation completed - - Falls prevention discussed     Assessment and Plan:   1. Pneumonia due to COVID-19 virus  Doing much better   2. Hypoxemia  Off oxygen   3. Thrombocytopenia (HCC)   4. Hospital discharge follow-up  - CBC with Differential/Platelet - Comprehensive metabolic panel I discussed the assessment and treatment plan with the patient. The patient was provided an opportunity to ask questions and all were answered. The patient agreed with the plan and demonstrated an understanding of the instructions.   The patient was advised to call back or seek an in-person evaluation if the symptoms worsen or if the condition fails to improve as anticipated.  I provided 25  minutes of non-face-to-face time during this encounter.  Ruel Favors, MD 02/26/20 7:42 AM

## 2020-02-26 ENCOUNTER — Ambulatory Visit (INDEPENDENT_AMBULATORY_CARE_PROVIDER_SITE_OTHER): Payer: Medicare Other | Admitting: Family Medicine

## 2020-02-26 ENCOUNTER — Encounter: Payer: Self-pay | Admitting: Family Medicine

## 2020-02-26 DIAGNOSIS — D696 Thrombocytopenia, unspecified: Secondary | ICD-10-CM

## 2020-02-26 DIAGNOSIS — R0902 Hypoxemia: Secondary | ICD-10-CM | POA: Diagnosis not present

## 2020-02-26 DIAGNOSIS — I700 Atherosclerosis of aorta: Secondary | ICD-10-CM | POA: Diagnosis not present

## 2020-02-26 DIAGNOSIS — Z79899 Other long term (current) drug therapy: Secondary | ICD-10-CM | POA: Diagnosis not present

## 2020-02-26 DIAGNOSIS — E119 Type 2 diabetes mellitus without complications: Secondary | ICD-10-CM | POA: Diagnosis not present

## 2020-02-26 DIAGNOSIS — U071 COVID-19: Secondary | ICD-10-CM | POA: Diagnosis not present

## 2020-02-26 DIAGNOSIS — J1282 Pneumonia due to coronavirus disease 2019: Secondary | ICD-10-CM | POA: Diagnosis not present

## 2020-02-26 DIAGNOSIS — Z7982 Long term (current) use of aspirin: Secondary | ICD-10-CM | POA: Diagnosis not present

## 2020-02-26 DIAGNOSIS — Z9181 History of falling: Secondary | ICD-10-CM | POA: Diagnosis not present

## 2020-02-26 DIAGNOSIS — Z9981 Dependence on supplemental oxygen: Secondary | ICD-10-CM | POA: Diagnosis not present

## 2020-02-26 DIAGNOSIS — E039 Hypothyroidism, unspecified: Secondary | ICD-10-CM | POA: Diagnosis not present

## 2020-02-26 DIAGNOSIS — K219 Gastro-esophageal reflux disease without esophagitis: Secondary | ICD-10-CM | POA: Diagnosis not present

## 2020-02-26 DIAGNOSIS — M8580 Other specified disorders of bone density and structure, unspecified site: Secondary | ICD-10-CM | POA: Diagnosis not present

## 2020-02-26 DIAGNOSIS — E785 Hyperlipidemia, unspecified: Secondary | ICD-10-CM | POA: Diagnosis not present

## 2020-02-26 DIAGNOSIS — Z7952 Long term (current) use of systemic steroids: Secondary | ICD-10-CM | POA: Diagnosis not present

## 2020-02-26 DIAGNOSIS — Z7984 Long term (current) use of oral hypoglycemic drugs: Secondary | ICD-10-CM | POA: Diagnosis not present

## 2020-02-26 DIAGNOSIS — J440 Chronic obstructive pulmonary disease with acute lower respiratory infection: Secondary | ICD-10-CM | POA: Diagnosis not present

## 2020-02-26 DIAGNOSIS — Z09 Encounter for follow-up examination after completed treatment for conditions other than malignant neoplasm: Secondary | ICD-10-CM | POA: Diagnosis not present

## 2020-02-26 DIAGNOSIS — I10 Essential (primary) hypertension: Secondary | ICD-10-CM | POA: Diagnosis not present

## 2020-02-26 DIAGNOSIS — F17210 Nicotine dependence, cigarettes, uncomplicated: Secondary | ICD-10-CM | POA: Diagnosis not present

## 2020-02-26 DIAGNOSIS — M5417 Radiculopathy, lumbosacral region: Secondary | ICD-10-CM | POA: Diagnosis not present

## 2020-02-27 DIAGNOSIS — L2084 Intrinsic (allergic) eczema: Secondary | ICD-10-CM | POA: Diagnosis not present

## 2020-02-27 DIAGNOSIS — Z79899 Other long term (current) drug therapy: Secondary | ICD-10-CM | POA: Diagnosis not present

## 2020-02-27 DIAGNOSIS — H168 Other keratitis: Secondary | ICD-10-CM | POA: Diagnosis not present

## 2020-03-01 NOTE — Progress Notes (Signed)
Name: Julie Wall   MRN: [id number]    DOB: 09-24-1955   Date:03/02/2020       Progress Note  Subjective  Chief Complaint  Chief Complaint  Patient presents with  . Diabetes  . Hypertension  . Hyperlipidemia  . Follow-up    HPI  Diabetes follow up: she was seen in April and A1C was up at 7.1%, today is 7.3%, however better than expected since she was recently admitted to [facility] for COVID -19 infection and took prednisone taper ,  avoiding sweet beverages and fasting glucose at home has been between 110 -125 fasting . She denies polyphagia, polydipsia or polyuria. Eye exam is up to date, noticed decrease in vision of left eye, has a white film, seeing eye doctor. She also has associated obesity, dyslipidemia, HTN and microalbuminuria. She is complaint with medication and denies side effects   Eczema: she sees dermatologist at [facility], last visit was with Dr. Dub Mikes- [place]. She is taking Methotrexate 15 mg weekly and has triamcinolone at home, blisters resolved, she needs labs today from hospital follow up and we will send a copy to Dermatologist as requested  HTN: she has associated dyslipidemia and DM, she has been taking half dose of Losartan because bp is a little low, but we need for kidney protection, she denies dizziness  Hyperlipidemia:she is backAtorvastatinand aspirin daily,last LDL was at goal at 32 , HDL was low, and needs to increase intake of fish and tree nuts, also advised to walk more often .She had lung cancer screening 05/2017 and it showed aorta atherosclerosis. Continue statin therapy   COPD: she quit smoking 12/03/2016, but resumed smokingFall 2019l,she quit again when she developed COVID-19 . She is taking Anoro. She states since pneumonia COVID-19 she has recovered well, finished medications and is no longer using oxygen. She denies coughing .  Back pain: she has back pain a few times a week, pain now3/10 at rest, but goes up to8/10 when walking, and takes  hydrocodone prn, she states sometimes right leg gives out,and has radiculitis intermittent on left lower leg / thigh area She denies leg weakness, no bowel or bladder incontinence, she has numbness on left lateral thigh. Discussed risk of addiction and importance of no diversion.She is doing okay with 30 pills of hydrocodone to last at least 4  months. She has been seeing Dr. Sherryll Burger who ordered MRI and results below, she was advised to see neurosurgeon but she is scared of injections and or surgery  IMPRESSION/MRI thoracic spine 09/04/2019   Diffuse degenerative disc narrowing and bulging with small protrusions. Dominant findings at T11-12 where there is cord impingement with flattening and mild T2 hyperintensity by prior lumbar MRI.  IMPRESSION:08/03/2019  1. Moderate spinal stenosis L3-4 unchanged. Moderate subarticular stenosis bilaterally. 2. Severe spinal stenosis with progression since the prior study. Severe subarticular and foraminal stenosis on the left. 3. Moderate subarticular stenosis bilaterally L5-S1. 4. Progressive degenerative changes at T11-12 with significant spinal stenosis and mild cord signal abnormality. Thoracic MRI with dedicated axial images through this level may be helpful.  RLS: She sees neurologist and is on higher dose of Requip and it works well for her, not affecting her sleep anymore   Depression: she is feeling down lately, social isolation, upset about hair loss, appetite goes up and down, does not want medications or therapy at this time   Patient Active Problem List   Diagnosis Date Noted  . Acute respiratory failure with hypoxemia (HCC)   . Hypotension   .  Diabetic polyneuropathy associated with type 2 diabetes mellitus (HCC)   . Weakness   . Pneumonia due to COVID-19 virus 02/05/2020  . Hilar lymphadenopathy 02/05/2020  . Morbid obesity (HCC) 06/26/2018  . Atherosclerosis of aorta (HCC) 05/04/2017  . Pap smear abnormality of cervix  with ASCUS favoring benign 04/23/2017  . Vitamin D deficiency 12/14/2016  . Obesity (BMI 30.0-34.9) 12/13/2015  . Chronic bronchitis (HCC) 05/14/2015  . Diabetes mellitus (HCC) 05/14/2015  . Positive H. pylori test 04/05/2015  . Rapid urease test for Helicobacter pylori infection positive 04/05/2015  . Umbilical hernia 04/01/2015  . Exomphalos 04/01/2015  . RLS (restless legs syndrome) 01/08/2015  . Type 2 diabetes mellitus with hyperlipidemia (HCC) 01/08/2015  . Neurogenic claudication 01/08/2015  . AD (atopic dermatitis) 12/28/2014  . Chronic LBP 12/28/2014  . Dyslipidemia 12/28/2014  . Gastroesophageal reflux disease without esophagitis 12/28/2014  . Microalbuminuria 12/28/2014  . Disorder of bone and cartilage 12/28/2014  . Neuralgia of left thigh 12/28/2014  . Perennial allergic rhinitis with seasonal variation 12/28/2014  . Tobacco abuse 12/28/2014  . Mononeuropathy of left lower extremity 12/28/2014  . Hypothyroidism 09/22/2005  . Hypertension, benign 09/22/2005    Past Surgical History:  Procedure Laterality Date  . CATARACT EXTRACTION Bilateral 1980  . CESAREAN SECTION    . CORNEAL TRANSPLANT Left    [facility]-Treatment for blindness.    Family History  Problem Relation Age of Onset  . Diabetes Mother   . Hypertension Mother   . Pancreatic cancer Mother   . Alcohol abuse Father   . Arthritis/Rheumatoid Sister   . Diabetes Sister   . Kidney disease Sister   . Diabetes Brother   . Breast cancer Neg Hx     Social History   Tobacco Use  . Smoking status: Former Smoker    Packs/day: 0.50    Years: 46.00    Pack years: 23.00    Types: Cigarettes    Start date: 01/07/1985    Quit date: 02/01/2020    Years since quitting: 0.0  . Smokeless tobacco: Never Used  Substance Use Topics  . Alcohol use: No    Alcohol/week: 0.0 standard drinks     Current Outpatient Medications:  .  albuterol (VENTOLIN HFA) 108 (90 Base) MCG/ACT inhaler, Inhale 2 puffs into the lungs  every 6 (six) hours as needed for wheezing or shortness of breath., Disp: 18 g, Rfl: 0 .  ascorbic acid (VITAMIN C) 500 MG tablet, Take 1 tablet (500 mg total) by mouth daily., Disp: 30 tablet, Rfl: 2 .  aspirin 81 MG chewable tablet, Chew 1 tablet by mouth daily., Disp: , Rfl:  .  atorvastatin (LIPITOR) 40 MG tablet, Take 1 tablet (40 mg total) by mouth daily. In place of simvastatin, Disp: 90 tablet, Rfl: 1 .  cholecalciferol (VITAMIN D3) 25 MCG (1000 UNIT) tablet, Take 1 tablet (1,000 Units total) by mouth daily., Disp: 90 tablet, Rfl: 1 .  dorzolamide-timolol (COSOPT) 22.3-6.8 MG/ML ophthalmic solution, Place 1 drop into both eyes 2 (two) times daily. , Disp: , Rfl:  .  empagliflozin (JARDIANCE) 25 MG TABS tablet, Take 1 tablet (25 mg total) by mouth daily., Disp: 90 tablet, Rfl: 1 .  erythromycin ophthalmic ointment, SMARTSIG:In Eye(s), Disp: , Rfl:  .  folic acid (FOLVITE) 1 MG tablet, Take 1 mg by mouth daily. , Disp: , Rfl:  .  gabapentin (NEURONTIN) 100 MG capsule, Take 200 mg by mouth 3 (three) times daily., Disp: , Rfl:  .  glucose blood  test strip, Check blood sugar once daily fasting, and up to 4 times daily PRN (Patient taking differently: Check blood sugar once daily fasting, and up to 4 times daily PRN ACCU CHEK smartview), Disp: 100 each, Rfl: 2 .  HYDROcodone-acetaminophen (NORCO/VICODIN) 5-325 MG tablet, Take 2 tablets by mouth 2 (two) times daily as needed for moderate pain., Disp: 30 tablet, Rfl: 0 .  Lancet Devices (SIMPLE DIAGNOSTICS LANCING DEV) MISC, Check blood sugar once daily fasting, and up to 4 times daily PRN., Disp: 100 each, Rfl: 2 .  losartan (COZAAR) 25 MG tablet, Take 0.5 tablets (12.5 mg total) by mouth daily. In place of lisinopril, Disp: 45 tablet, Rfl: 1 .  metFORMIN (GLUCOPHAGE-XR) 750 MG 24 hr tablet, Take 2 tablets (1,500 mg total) by mouth daily with breakfast., Disp: 180 tablet, Rfl: 1 .  methotrexate (RHEUMATREX) 2.5 MG tablet, Take 15 mg by mouth every  Friday. , Disp: , Rfl:  .  POLYTRIM ophthalmic solution, Place into the left eye., Disp: , Rfl:  .  rOPINIRole (REQUIP XL) 4 MG 24 hr tablet, Take 4 mg by mouth at bedtime. , Disp: , Rfl:  .  triamcinolone ointment (KENALOG) 0.1 %, Apply topically 2 (two) times daily., Disp: 30 g, Rfl: 0 .  umeclidinium-vilanterol (ANORO ELLIPTA) 62.5-25 MCG/INH AEPB, Inhale 1 puff into the lungs daily., Disp: 180 each, Rfl: 2 .  VIGAMOX 0.5 % ophthalmic solution, , Disp: , Rfl:  .  zinc sulfate 220 (50 Zn) MG capsule, Take 1 capsule (220 mg total) by mouth daily., Disp: 30 capsule, Rfl: 2  Allergies  Allergen Reactions  . Penicillins Rash    I personally reviewed active problem list, medication list, allergies, family history, social history, health maintenance with the patient/caregiver today.   ROS  Constitutional: Negative for fever or significant  weight change.  Respiratory: Negative for cough and shortness of breath.   Cardiovascular: Negative for chest pain or palpitations.  Gastrointestinal: Negative for abdominal pain, no bowel changes.  Musculoskeletal: Negative for gait problem or joint swelling.  Skin: Negative for rash.  Neurological: Negative for dizziness or headache.  No other specific complaints in a complete review of systems (except as listed in HPI above).  Objective  Vitals:   03/02/20 1159  BP: 108/70  Pulse: (!) 101  Resp: 16  Temp: 98.2 F (36.8 C)  TempSrc: Oral  SpO2: 93%  Weight: 165 lb 12.8 oz (75.2 kg)  Height: 5\' 4"  (1.626 m)    Body mass index is 28.46 kg/m.  Physical Exam  Constitutional: Patient appears well-developed and well-nourished. Obese  No distress.  HEENT: head atraumatic, normocephalic, left eye has a dull color and white film over cornea, decrease vision on left side  , neck supple Cardiovascular: Normal rate, regular rhythm and normal heart sounds.  No murmur heard. No BLE edema. Pulmonary/Chest: Effort normal and breath sounds normal. No  respiratory distress. Abdominal: Soft.  There is no tenderness. Psychiatric: Patient has a normal mood and affect. behavior is normal. Judgment and thought content normal.   Recent Results (from the past 2160 hour(s))  POCT Urinalysis Dipstick     Status: Abnormal   Collection Time: 02/04/20 11:33 AM  Result Value Ref Range   Color, UA Orange    Clarity, UA Cloudy    Glucose, UA Positive (A) Negative    Comment: >2000   Bilirubin, UA Positive     Comment: Moderate   Ketones, UA Positive     Comment: 80-160  Spec Grav, UA 1.025 1.010 - 1.025    Comment: 1.030   Blood, UA Positive     Comment: Large   pH, UA 6.0 5.0 - 8.0   Protein, UA Positive (A) Negative   Urobilinogen, UA 0.2 0.2 or 1.0 E.U./dL   Nitrite, UA Negative    Leukocytes, UA Negative Negative   Appearance Abnormal    Odor Abnormal   CULTURE, URINE COMPREHENSIVE     Status: None   Collection Time: 02/04/20  1:52 PM   Specimen: Urine  Result Value Ref Range   MICRO NUMBER: [id number]    SPECIMEN QUALITY: Adequate    Source OTHER (SPECIFY)    STATUS: FINAL    RESULT: No Growth   CBC     Status: Abnormal   Collection Time: 02/04/20  8:25 PM  Result Value Ref Range   WBC 4.7 4.0 - 10.5 K/uL   RBC 4.77 3.87 - 5.11 MIL/uL   Hemoglobin 15.1 (H) 12.0 - 15.0 g/dL   HCT 54.0 36 - 46 %   MCV 92.5 80.0 - 100.0 fL   MCH 31.7 26.0 - 34.0 pg   MCHC 34.2 30.0 - 36.0 g/dL   RDW 98.1 19.1 - 47.8 %   Platelets 133 (L) 150 - 400 K/uL   nRBC 0.0 0.0 - 0.2 %    Comment: Performed at [facility], [address] [place], [place] [address]  Comprehensive metabolic panel     Status: Abnormal   Collection Time: 02/04/20  8:25 PM  Result Value Ref Range   Sodium 134 (L) 135 - 145 mmol/L   Potassium 4.1 3.5 - 5.1 mmol/L   Chloride 102 98 - 111 mmol/L   CO2 19 (L) 22 - 32 mmol/L   Glucose, Bld 135 (H) 70 - 99 mg/dL    Comment: Glucose reference range applies only to samples taken after fasting for at least 8 hours.    BUN 22 8 - 23 mg/dL   Creatinine, Ser 1.30 0.44 - 1.00 mg/dL   Calcium 8.5 (L) 8.9 - 10.3 mg/dL   Total Protein 8.0 6.5 - 8.1 g/dL   Albumin 3.7 3.5 - 5.0 g/dL   AST 42 (H) 15 - 41 U/L   ALT 18 0 - 44 U/L   Alkaline Phosphatase 55 38 - 126 U/L   Total Bilirubin 1.4 (H) 0.3 - 1.2 mg/dL   GFR calc non Af Amer >60 >60 mL/min   GFR calc Af Amer >60 >60 mL/min   Anion gap 13 5 - 15    Comment: Performed at [facility], [address] [place], [place] [address]  Lactic acid, plasma     Status: None   Collection Time: 02/04/20  8:25 PM  Result Value Ref Range   Lactic Acid, Venous 1.1 0.5 - 1.9 mmol/L    Comment: Performed at [facility], [address], [place], [place] [address]  Troponin I (High Sensitivity)     Status: None   Collection Time: 02/04/20  8:25 PM  Result Value Ref Range   Troponin I (High Sensitivity) 9 <18 ng/L    Comment: (NOTE) Elevated high sensitivity troponin I (hsTnI) values and significant  changes across serial measurements may suggest ACS but many other  chronic and acute conditions are known to elevate hsTnI results.  Refer to the "Links" section for chest pain algorithms and additional  guidance. Performed at [facility], [address], [place], [place] [address]   Urinalysis,  Complete w Microscopic     Status: Abnormal   Collection Time: 02/04/20  8:25 PM  Result Value Ref Range   Color, Urine YELLOW (A) YELLOW   APPearance HAZY (A) CLEAR   Specific Gravity, Urine 1.030 1.005 - 1.030   pH 5.0 5.0 - 8.0   Glucose, UA >=500 (A) NEGATIVE mg/dL   Hgb urine dipstick SMALL (A) NEGATIVE   Bilirubin Urine NEGATIVE NEGATIVE   Ketones, ur 20 (A) NEGATIVE mg/dL   Protein, ur 161 (A) NEGATIVE mg/dL   Nitrite NEGATIVE NEGATIVE   Leukocytes,Ua NEGATIVE NEGATIVE   RBC / HPF 11-20 0 - 5 RBC/hpf   WBC, UA 6-10 0 - 5 WBC/hpf   Bacteria, UA RARE (A) NONE SEEN   Squamous Epithelial / LPF 0-5 0 - 5   Mucus PRESENT    Budding  Yeast PRESENT     Comment: Performed at [facility], [address] [place], [place] [address]  Blood culture (routine x 2)     Status: None   Collection Time: 02/04/20  8:25 PM   Specimen: BLOOD  Result Value Ref Range   Specimen Description BLOOD LEFT AC    Special Requests      BOTTLES DRAWN AEROBIC AND ANAEROBIC Blood Culture adequate volume   Culture      NO GROWTH 5 DAYS Performed at [facility], [address], [place], [place] [address]    Report Status 02/09/2020 FINAL   ABO/Rh     Status: None   Collection Time: 02/04/20  8:25 PM  Result Value Ref Range   ABO/RH(D)      B POS Performed at [facility], [address], [place], [place] [address]   Urine Culture     Status: None   Collection Time: 02/04/20  8:48 PM   Specimen: Urine, Clean Catch  Result Value Ref Range   Specimen Description      URINE, CLEAN CATCH Performed at [facility], [address] [place], [place] [address]    Special Requests NONE    Culture      NO GROWTH Performed at [facility], [address] [place], [place] [address]    Report Status 02/06/2020 FINAL   SARS Coronavirus 2 by RT PCR (hospital order, performed in [facility] hospital lab) Nasopharyngeal Nasopharyngeal Swab     Status: Abnormal   Collection Time: 02/05/20  4:59 AM   Specimen: Nasopharyngeal Swab  Result Value Ref Range   SARS Coronavirus 2 POSITIVE (A) NEGATIVE    Comment: RESULT CALLED TO, READ BACK BY AND VERIFIED WITH: ASHTON PETERS AT 0632 ON 02/05/2020 [facility]. (NOTE) SARS-CoV-2 target nucleic acids are DETECTED  SARS-CoV-2 RNA is generally detectable in upper respiratory specimens  during the acute phase of infection.  Positive results are indicative  of the presence of the identified virus, but do not rule out bacterial infection or co-infection with other pathogens not detected by the test.  Clinical correlation with patient history and  other diagnostic  information is necessary to determine patient infection status.  The expected result is negative.  Fact Sheet for Patients:   BoilerBrush.com.cy   Fact Sheet for Healthcare Providers:   https://pope.com/    This test is not yet approved or cleared by the [place] FDA and  has been authorized for detection and/or diagnosis of SARS-CoV-2 by FDA under an Emergency Use Authorization (EUA).  This EUA will remain in effect (meaning thi s test can be  used) for the duration of  the COVID-19 declaration under Section 564(b)(1) of the Act, 21 U.S.C. section 360-bbb-3(b)(1), unless the authorization is terminated or revoked sooner.  Performed at [facility], [address] [place], [place] [address]   Procalcitonin - Baseline     Status: None   Collection Time: 02/05/20  5:05 AM  Result Value Ref Range   Procalcitonin <0.10 ng/mL    Comment:        Interpretation: PCT (Procalcitonin) <= 0.5 ng/mL: Systemic infection (sepsis) is not likely. Local bacterial infection is possible. (NOTE)       Sepsis PCT Algorithm           Lower Respiratory Tract                                      Infection PCT Algorithm    ----------------------------     ----------------------------         PCT < 0.25 ng/mL                PCT < 0.10 ng/mL          Strongly encourage             Strongly discourage   discontinuation of antibiotics    initiation of antibiotics    ----------------------------     -----------------------------       PCT 0.25 - 0.50 ng/mL            PCT 0.10 - 0.25 ng/mL               OR       >80% decrease in PCT            Discourage initiation of                                            antibiotics      Encourage discontinuation           of antibiotics    ----------------------------     -----------------------------         PCT >= 0.50 ng/mL              PCT 0.26 - 0.50 ng/mL               AND        <80% decrease  in PCT             Encourage initiation of                                             antibiotics       Encourage continuation           of antibiotics    ----------------------------     -----------------------------        PCT >= 0.50 ng/mL                  PCT > 0.50 ng/mL               AND         increase in PCT  Strongly encourage                                      initiation of antibiotics    Strongly encourage escalation           of antibiotics                                     -----------------------------                                           PCT <= 0.25 ng/mL                                                 OR                                        > 80% decrease in PCT                                      Discontinue / Do not initiate                                             antibiotics  Performed at [facility], [address] [place], [place] [address]   Culture, blood (Routine X 2) w Reflex to ID Panel     Status: None   Collection Time: 02/05/20  5:09 AM   Specimen: BLOOD  Result Value Ref Range   Specimen Description BLOOD LEFT ANTECUBITAL    Special Requests      BOTTLES DRAWN AEROBIC AND ANAEROBIC Blood Culture adequate volume   Culture      NO GROWTH 5 DAYS Performed at [facility], [address], [place], [place] [address]    Report Status 02/10/2020 FINAL   Brain natriuretic peptide     Status: None   Collection Time: 02/05/20  7:07 AM  Result Value Ref Range   B Natriuretic Peptide 12.7 0.0 - 100.0 pg/mL    Comment: Performed at [facility], [address] [place], [place] [address]  Glucose, capillary     Status: Abnormal   Collection Time: 02/05/20 11:20 AM  Result Value Ref Range   Glucose-Capillary 133 (H) 70 - 99 mg/dL    Comment: Glucose reference range applies only to samples taken after fasting for at least 8 hours.  Glucose, capillary     Status: Abnormal   Collection Time:  02/05/20  6:10 PM  Result Value Ref Range   Glucose-Capillary 151 (H) 70 - 99 mg/dL    Comment: Glucose reference range applies only to samples taken after fasting for at least 8 hours.  Glucose, capillary     Status: Abnormal   Collection Time: 02/05/20  7:36 PM  Result Value Ref Range  Glucose-Capillary 127 (H) 70 - 99 mg/dL    Comment: Glucose reference range applies only to samples taken after fasting for at least 8 hours.  CBC with Differential/Platelet     Status: None   Collection Time: 02/06/20  4:53 AM  Result Value Ref Range   WBC 7.1 4.0 - 10.5 K/uL   RBC 4.12 3.87 - 5.11 MIL/uL   Hemoglobin 13.1 12.0 - 15.0 g/dL   HCT 40.9 36 - 46 %   MCV 94.2 80.0 - 100.0 fL   MCH 31.8 26.0 - 34.0 pg   MCHC 33.8 30.0 - 36.0 g/dL   RDW 81.1 91.4 - 78.2 %   Platelets 156 150 - 400 K/uL   nRBC 0.0 0.0 - 0.2 %   Neutrophils Relative % 78 %   Neutro Abs 5.5 1.7 - 7.7 K/uL   Lymphocytes Relative 16 %   Lymphs Abs 1.1 0.7 - 4.0 K/uL   Monocytes Relative 5 %   Monocytes Absolute 0.4 0 - 1 K/uL   Eosinophils Relative 0 %   Eosinophils Absolute 0.0 0 - 0 K/uL   Basophils Relative 0 %   Basophils Absolute 0.0 0 - 0 K/uL   Immature Granulocytes 1 %   Abs Immature Granulocytes 0.05 0.00 - 0.07 K/uL    Comment: Performed at [facility], [address] [place], [place] [address]  Comprehensive metabolic panel     Status: Abnormal   Collection Time: 02/06/20  4:53 AM  Result Value Ref Range   Sodium 137 135 - 145 mmol/L   Potassium 4.3 3.5 - 5.1 mmol/L   Chloride 108 98 - 111 mmol/L   CO2 20 (L) 22 - 32 mmol/L   Glucose, Bld 92 70 - 99 mg/dL    Comment: Glucose reference range applies only to samples taken after fasting for at least 8 hours.   BUN 21 8 - 23 mg/dL   Creatinine, Ser 3.08 0.44 - 1.00 mg/dL   Calcium 8.3 (L) 8.9 - 10.3 mg/dL   Total Protein 6.6 6.5 - 8.1 g/dL   Albumin 2.9 (L) 3.5 - 5.0 g/dL   AST 36 15 - 41 U/L   ALT 15 0 - 44 U/L   Alkaline Phosphatase 52 38  - 126 U/L   Total Bilirubin 1.0 0.3 - 1.2 mg/dL   GFR calc non Af Amer >60 >60 mL/min   GFR calc Af Amer >60 >60 mL/min   Anion gap 9 5 - 15    Comment: Performed at [facility], [address] [place], [place] [address]  C-reactive protein     Status: Abnormal   Collection Time: 02/06/20  4:53 AM  Result Value Ref Range   CRP 10.6 (H) <1.0 mg/dL    Comment: Performed at [facility], [address] [place], [place] [address]  Fibrin derivatives D-Dimer [facility] only)     Status: Abnormal   Collection Time: 02/06/20  4:53 AM  Result Value Ref Range   Fibrin derivatives D-dimer ([facility]) 1,004.34 (H) 0.00 - 499.00 ng/mL (FEU)    Comment: (NOTE) <> Exclusion of Venous Thromboembolism (VTE) - OUTPATIENT ONLY   (Emergency Department or [place])    0-499 ng/ml (FEU): With a low to intermediate pretest probability                      for VTE this test result excludes the diagnosis  of VTE.   >499 ng/ml (FEU) : VTE not excluded; additional work up for VTE is                      required.  <> Testing on Inpatients and Evaluation of Disseminated Intravascular   Coagulation (DIC) Reference Range:   0-499 ng/ml (FEU) Performed at [facility], [address] [place], [place] [address]   Ferritin     Status: Abnormal   Collection Time: 02/06/20  4:53 AM  Result Value Ref Range   Ferritin 767 (H) 11 - 307 ng/mL    Comment: Performed at [facility], [address], [place], [place] [address]  Magnesium     Status: None   Collection Time: 02/06/20  4:53 AM  Result Value Ref Range   Magnesium 2.1 1.7 - 2.4 mg/dL    Comment: Performed at [facility], [address], [place], [place] [address]  Phosphorus     Status: None   Collection Time: 02/06/20  4:53 AM  Result Value Ref Range   Phosphorus 2.7 2.5 - 4.6 mg/dL    Comment: Performed at [facility], [address] [place], [place] [address]  HIV Antibody  (routine testing w rflx)     Status: None   Collection Time: 02/06/20  4:53 AM  Result Value Ref Range   HIV Screen 4th Generation wRfx Non Reactive Non Reactive    Comment: Performed at [facility], [address], [place], [place] [address]  Glucose, capillary     Status: None   Collection Time: 02/06/20  7:30 AM  Result Value Ref Range   Glucose-Capillary 85 70 - 99 mg/dL    Comment: Glucose reference range applies only to samples taken after fasting for at least 8 hours.  Glucose, capillary     Status: Abnormal   Collection Time: 02/06/20 11:51 AM  Result Value Ref Range   Glucose-Capillary 123 (H) 70 - 99 mg/dL    Comment: Glucose reference range applies only to samples taken after fasting for at least 8 hours.  Glucose, capillary     Status: Abnormal   Collection Time: 02/06/20  4:13 PM  Result Value Ref Range   Glucose-Capillary 190 (H) 70 - 99 mg/dL    Comment: Glucose reference range applies only to samples taken after fasting for at least 8 hours.  CBC with Differential/Platelet     Status: Abnormal   Collection Time: 02/07/20  5:44 AM  Result Value Ref Range   WBC 7.3 4.0 - 10.5 K/uL   RBC 4.37 3.87 - 5.11 MIL/uL   Hemoglobin 13.9 12.0 - 15.0 g/dL   HCT 13.0 36 - 46 %   MCV 89.5 80.0 - 100.0 fL   MCH 31.8 26.0 - 34.0 pg   MCHC 35.5 30.0 - 36.0 g/dL   RDW 86.5 78.4 - 69.6 %   Platelets 203 150 - 400 K/uL   nRBC 0.0 0.0 - 0.2 %   Neutrophils Relative % 76 %   Neutro Abs 5.5 1.7 - 7.7 K/uL   Lymphocytes Relative 18 %   Lymphs Abs 1.3 0.7 - 4.0 K/uL   Monocytes Relative 4 %   Monocytes Absolute 0.3 0 - 1 K/uL   Eosinophils Relative 0 %   Eosinophils Absolute 0.0 0 - 0 K/uL   Basophils Relative 1 %   Basophils Absolute 0.0 0 - 0 K/uL   WBC Morphology MORPHOLOGY UNREMARKABLE  RBC Morphology MORPHOLOGY UNREMARKABLE    Smear Review Normal platelet morphology    Immature Granulocytes 1 %   Abs Immature Granulocytes 0.09 (H) 0.00 - 0.07 K/uL    Comment:  Performed at [facility], [address] [place], [place] [address]  Comprehensive metabolic panel     Status: Abnormal   Collection Time: 02/07/20  5:44 AM  Result Value Ref Range   Sodium 140 135 - 145 mmol/L   Potassium 4.0 3.5 - 5.1 mmol/L   Chloride 109 98 - 111 mmol/L   CO2 23 22 - 32 mmol/L   Glucose, Bld 106 (H) 70 - 99 mg/dL    Comment: Glucose reference range applies only to samples taken after fasting for at least 8 hours.   BUN 22 8 - 23 mg/dL   Creatinine, Ser 0.63 0.44 - 1.00 mg/dL   Calcium 8.6 (L) 8.9 - 10.3 mg/dL   Total Protein 7.0 6.5 - 8.1 g/dL   Albumin 2.9 (L) 3.5 - 5.0 g/dL   AST 44 (H) 15 - 41 U/L   ALT 17 0 - 44 U/L   Alkaline Phosphatase 60 38 - 126 U/L   Total Bilirubin 0.7 0.3 - 1.2 mg/dL   GFR calc non Af Amer >60 >60 mL/min   GFR calc Af Amer >60 >60 mL/min   Anion gap 8 5 - 15    Comment: Performed at [facility], [address] [place], [place] [address]  C-reactive protein     Status: Abnormal   Collection Time: 02/07/20  5:44 AM  Result Value Ref Range   CRP 8.2 (H) <1.0 mg/dL    Comment: Performed at [facility], [address], [place], [place] [address]  Fibrin derivatives D-Dimer [facility] only)     Status: Abnormal   Collection Time: 02/07/20  5:44 AM  Result Value Ref Range   Fibrin derivatives D-dimer ([facility]) 891.20 (H) 0.00 - 499.00 ng/mL (FEU)    Comment: (NOTE) <> Exclusion of Venous Thromboembolism (VTE) - OUTPATIENT ONLY   (Emergency Department or [place])    0-499 ng/ml (FEU): With a low to intermediate pretest probability                      for VTE this test result excludes the diagnosis                      of VTE.   >499 ng/ml (FEU) : VTE not excluded; additional work up for VTE is                      required.  <> Testing on Inpatients and Evaluation of Disseminated Intravascular   Coagulation (DIC) Reference Range:   0-499 ng/ml (FEU) Performed at [facility], [address]  [place], [place] [address]   Ferritin     Status: Abnormal   Collection Time: 02/07/20  5:44 AM  Result Value Ref Range   Ferritin 1,098 (H) 11 - 307 ng/mL    Comment: Performed at [facility], [address], [place], [place] [address]  Magnesium     Status: None   Collection Time: 02/07/20  5:44 AM  Result Value Ref Range   Magnesium 2.0 1.7 - 2.4 mg/dL    Comment: Performed at [facility], [address], [place], [place] [address]  Phosphorus     Status: None   Collection Time: 02/07/20  5:44  AM  Result Value Ref Range   Phosphorus 3.0 2.5 - 4.6 mg/dL    Comment: Performed at [facility], [address] [place], [place] [address]  Glucose, capillary     Status: None   Collection Time: 02/07/20  7:22 AM  Result Value Ref Range   Glucose-Capillary 93 70 - 99 mg/dL    Comment: Glucose reference range applies only to samples taken after fasting for at least 8 hours.  Glucose, capillary     Status: Abnormal   Collection Time: 02/07/20 11:51 AM  Result Value Ref Range   Glucose-Capillary 180 (H) 70 - 99 mg/dL    Comment: Glucose reference range applies only to samples taken after fasting for at least 8 hours.  Glucose, capillary     Status: Abnormal   Collection Time: 02/07/20  5:05 PM  Result Value Ref Range   Glucose-Capillary 238 (H) 70 - 99 mg/dL    Comment: Glucose reference range applies only to samples taken after fasting for at least 8 hours.  Glucose, capillary     Status: Abnormal   Collection Time: 02/07/20  9:17 PM  Result Value Ref Range   Glucose-Capillary 224 (H) 70 - 99 mg/dL    Comment: Glucose reference range applies only to samples taken after fasting for at least 8 hours.  CBC with Differential/Platelet     Status: Abnormal   Collection Time: 02/08/20  5:00 AM  Result Value Ref Range   WBC 13.5 (H) 4.0 - 10.5 K/uL   RBC 4.58 3.87 - 5.11 MIL/uL   Hemoglobin 14.6 12.0 - 15.0 g/dL   HCT 36.6 36 - 46 %   MCV 89.5 80.0 - 100.0 fL    MCH 31.9 26.0 - 34.0 pg   MCHC 35.6 30.0 - 36.0 g/dL   RDW 44.0 34.7 - 42.5 %   Platelets 251 150 - 400 K/uL   nRBC 0.0 0.0 - 0.2 %   Neutrophils Relative % 81 %   Neutro Abs 11.1 (H) 1.7 - 7.7 K/uL   Lymphocytes Relative 15 %   Lymphs Abs 2.0 0.7 - 4.0 K/uL   Monocytes Relative 3 %   Monocytes Absolute 0.4 0 - 1 K/uL   Eosinophils Relative 0 %   Eosinophils Absolute 0.0 0 - 0 K/uL   Basophils Relative 0 %   Basophils Absolute 0.0 0 - 0 K/uL   Immature Granulocytes 1 %   Abs Immature Granulocytes 0.10 (H) 0.00 - 0.07 K/uL    Comment: Performed at [facility], [address] [place], [place] [address]  Comprehensive metabolic panel     Status: Abnormal   Collection Time: 02/08/20  5:00 AM  Result Value Ref Range   Sodium 140 135 - 145 mmol/L   Potassium 4.0 3.5 - 5.1 mmol/L   Chloride 108 98 - 111 mmol/L   CO2 22 22 - 32 mmol/L   Glucose, Bld 217 (H) 70 - 99 mg/dL    Comment: Glucose reference range applies only to samples taken after fasting for at least 8 hours.   BUN 19 8 - 23 mg/dL   Creatinine, Ser 7.56 0.44 - 1.00 mg/dL   Calcium 8.8 (L) 8.9 - 10.3 mg/dL   Total Protein 7.3 6.5 - 8.1 g/dL   Albumin 3.0 (L) 3.5 - 5.0 g/dL   AST 44 (H) 15 - 41 U/L   ALT 20 0 - 44 U/L   Alkaline Phosphatase 75 38 - 126 U/L   Total  Bilirubin 0.8 0.3 - 1.2 mg/dL   GFR calc non Af Amer >60 >60 mL/min   GFR calc Af Amer >60 >60 mL/min   Anion gap 10 5 - 15    Comment: Performed at [facility], [address] [place], [place] [address]  Fibrin derivatives D-Dimer [facility] only)     Status: Abnormal   Collection Time: 02/08/20  5:00 AM  Result Value Ref Range   Fibrin derivatives D-dimer ([facility]) 951.65 (H) 0.00 - 499.00 ng/mL (FEU)    Comment: (NOTE) <> Exclusion of Venous Thromboembolism (VTE) - OUTPATIENT ONLY   (Emergency Department or [place])    0-499 ng/ml (FEU): With a low to intermediate pretest probability                      for VTE this test result  excludes the diagnosis                      of VTE.   >499 ng/ml (FEU) : VTE not excluded; additional work up for VTE is                      required.  <> Testing on Inpatients and Evaluation of Disseminated Intravascular   Coagulation (DIC) Reference Range:   0-499 ng/ml (FEU) Performed at [facility], [address] [place], [place] [address]   Ferritin     Status: Abnormal   Collection Time: 02/08/20  5:00 AM  Result Value Ref Range   Ferritin 1,096 (H) 11 - 307 ng/mL    Comment: Performed at [facility], [address] [place], [place] [address]  Magnesium     Status: None   Collection Time: 02/08/20  5:00 AM  Result Value Ref Range   Magnesium 1.9 1.7 - 2.4 mg/dL    Comment: Performed at [facility], [address] [place], [place] [address]  Phosphorus     Status: None   Collection Time: 02/08/20  5:00 AM  Result Value Ref Range   Phosphorus 3.1 2.5 - 4.6 mg/dL    Comment: Performed at [facility], [address] [place], [place] [address]  Glucose, capillary     Status: Abnormal   Collection Time: 02/08/20  8:11 AM  Result Value Ref Range   Glucose-Capillary 229 (H) 70 - 99 mg/dL    Comment: Glucose reference range applies only to samples taken after fasting for at least 8 hours.  C-reactive protein     Status: Abnormal   Collection Time: 02/08/20 10:17 AM  Result Value Ref Range   CRP 9.3 (H) <1.0 mg/dL    Comment: Performed at [facility], [address] [place], [place] [address]  Glucose, capillary     Status: Abnormal   Collection Time: 02/08/20 12:17 PM  Result Value Ref Range   Glucose-Capillary 153 (H) 70 - 99 mg/dL    Comment: Glucose reference range applies only to samples taken after fasting for at least 8 hours.  Glucose, capillary     Status: Abnormal   Collection Time: 02/08/20  3:44 PM  Result Value Ref Range   Glucose-Capillary 275 (H) 70 - 99 mg/dL    Comment: Glucose reference range applies  only to samples taken after fasting for at least 8 hours.  Glucose, capillary     Status: Abnormal   Collection Time: 02/08/20  9:16 PM  Result Value Ref Range   Glucose-Capillary  224 (H) 70 - 99 mg/dL    Comment: Glucose reference range applies only to samples taken after fasting for at least 8 hours.  CBC with Differential/Platelet     Status: Abnormal   Collection Time: 02/09/20  4:21 AM  Result Value Ref Range   WBC 17.0 (H) 4.0 - 10.5 K/uL   RBC 4.08 3.87 - 5.11 MIL/uL   Hemoglobin 12.8 12.0 - 15.0 g/dL   HCT 40.9 36 - 46 %   MCV 90.9 80.0 - 100.0 fL   MCH 31.4 26.0 - 34.0 pg   MCHC 34.5 30.0 - 36.0 g/dL   RDW 81.1 91.4 - 78.2 %   Platelets 325 150 - 400 K/uL   nRBC 0.0 0.0 - 0.2 %   Neutrophils Relative % 81 %   Neutro Abs 13.7 (H) 1.7 - 7.7 K/uL   Lymphocytes Relative 14 %   Lymphs Abs 2.4 0.7 - 4.0 K/uL   Monocytes Relative 4 %   Monocytes Absolute 0.6 0 - 1 K/uL   Eosinophils Relative 0 %   Eosinophils Absolute 0.0 0 - 0 K/uL   Basophils Relative 0 %   Basophils Absolute 0.0 0 - 0 K/uL   Immature Granulocytes 1 %   Abs Immature Granulocytes 0.21 (H) 0.00 - 0.07 K/uL   Abnormal Lymphocytes Present PRESENT     Comment: Performed at [facility], [address] [place], [place] [address]  Comprehensive metabolic panel     Status: Abnormal   Collection Time: 02/09/20  4:21 AM  Result Value Ref Range   Sodium 139 135 - 145 mmol/L   Potassium 4.4 3.5 - 5.1 mmol/L   Chloride 106 98 - 111 mmol/L   CO2 25 22 - 32 mmol/L   Glucose, Bld 207 (H) 70 - 99 mg/dL    Comment: Glucose reference range applies only to samples taken after fasting for at least 8 hours.   BUN 19 8 - 23 mg/dL   Creatinine, Ser 3.08 0.44 - 1.00 mg/dL   Calcium 9.2 8.9 - 65.7 mg/dL   Total Protein 7.2 6.5 - 8.1 g/dL   Albumin 2.8 (L) 3.5 - 5.0 g/dL   AST 41 15 - 41 U/L   ALT 23 0 - 44 U/L   Alkaline Phosphatase 84 38 - 126 U/L   Total Bilirubin 0.8 0.3 - 1.2 mg/dL   GFR calc non Af Amer  >60 >60 mL/min   GFR calc Af Amer >60 >60 mL/min   Anion gap 8 5 - 15    Comment: Performed at [facility], [address] [place], [place] [address]  C-reactive protein     Status: Abnormal   Collection Time: 02/09/20  4:21 AM  Result Value Ref Range   CRP 6.5 (H) <1.0 mg/dL    Comment: Performed at [facility], [address] [place], [place] [address]  Fibrin derivatives D-Dimer [facility] only)     Status: Abnormal   Collection Time: 02/09/20  4:21 AM  Result Value Ref Range   Fibrin derivatives D-dimer ([facility]) 1,183.58 (H) 0.00 - 499.00 ng/mL (FEU)    Comment: (NOTE) <> Exclusion of Venous Thromboembolism (VTE) - OUTPATIENT ONLY   (Emergency Department or [place])    0-499 ng/ml (FEU): With a low to intermediate pretest probability                      for VTE this test result excludes the diagnosis  of VTE.   >499 ng/ml (FEU) : VTE not excluded; additional work up for VTE is                      required.  <> Testing on Inpatients and Evaluation of Disseminated Intravascular   Coagulation (DIC) Reference Range:   0-499 ng/ml (FEU) Performed at [facility], [address] [place], [place] [address]   Ferritin     Status: Abnormal   Collection Time: 02/09/20  4:21 AM  Result Value Ref Range   Ferritin 1,143 (H) 11 - 307 ng/mL    Comment: Performed at [facility], [address], [place], [place] [address]  Magnesium     Status: None   Collection Time: 02/09/20  4:21 AM  Result Value Ref Range   Magnesium 1.9 1.7 - 2.4 mg/dL    Comment: Performed at [facility], [address] [place], [place] [address]  Phosphorus     Status: None   Collection Time: 02/09/20  4:21 AM  Result Value Ref Range   Phosphorus 3.5 2.5 - 4.6 mg/dL    Comment: Performed at [facility], [address] [place], [place] [address]  Blood gas, arterial     Status: Abnormal   Collection Time: 02/09/20  5:00 AM  Result  Value Ref Range   FIO2 0.40    Delivery systems NASAL CANNULA    pH, Arterial 7.52 (H) 7.35 - 7.45   pCO2 arterial 31 (L) 32 - 48 mmHg   pO2, Arterial 54 (L) 83 - 108 mmHg    Comment: CRITICAL RESULT CALLED TO, READ BACK BY AND VERIFIED WITH: TORAIN RN AT 0424 02/12/2020 BY SDAVID RRT    Bicarbonate 25.3 20.0 - 28.0 mmol/L   Acid-Base Excess 3.1 (H) 0.0 - 2.0 mmol/L   O2 Saturation 91.1 %   Patient temperature 37.0    Collection site RIGHT RADIAL    Sample type ARTERIAL DRAW    Allens test (pass/fail) PASS PASS    Comment: Performed at [facility], [address] [place], [place] [address]  Glucose, capillary     Status: Abnormal   Collection Time: 02/09/20  7:22 AM  Result Value Ref Range   Glucose-Capillary 189 (H) 70 - 99 mg/dL    Comment: Glucose reference range applies only to samples taken after fasting for at least 8 hours.  Glucose, capillary     Status: Abnormal   Collection Time: 02/09/20 11:29 AM  Result Value Ref Range   Glucose-Capillary 282 (H) 70 - 99 mg/dL    Comment: Glucose reference range applies only to samples taken after fasting for at least 8 hours.  Glucose, capillary     Status: Abnormal   Collection Time: 02/09/20  4:55 PM  Result Value Ref Range   Glucose-Capillary 279 (H) 70 - 99 mg/dL    Comment: Glucose reference range applies only to samples taken after fasting for at least 8 hours.  Glucose, capillary     Status: Abnormal   Collection Time: 02/09/20  9:21 PM  Result Value Ref Range   Glucose-Capillary 202 (H) 70 - 99 mg/dL    Comment: Glucose reference range applies only to samples taken after fasting for at least 8 hours.  CBC with Differential/Platelet     Status: Abnormal   Collection Time: 02/10/20  6:10 AM  Result Value Ref Range   WBC 13.8 (H) 4.0 - 10.5 K/uL   RBC 4.52 3.87 -  5.11 MIL/uL   Hemoglobin 14.2 12.0 - 15.0 g/dL   HCT 16.1 36 - 46 %   MCV 92.7 80.0 - 100.0 fL   MCH 31.4 26.0 - 34.0 pg   MCHC 33.9 30.0 - 36.0 g/dL    RDW 09.6 04.5 - 40.9 %   Platelets 316 150 - 400 K/uL   nRBC 0.0 0.0 - 0.2 %   Neutrophils Relative % 67 %   Neutro Abs 9.5 (H) 1.7 - 7.7 K/uL   Band Neutrophils 2 %   Lymphocytes Relative 19 %   Lymphs Abs 2.6 0.7 - 4.0 K/uL   Monocytes Relative 6 %   Monocytes Absolute 0.8 0 - 1 K/uL   Eosinophils Relative 0 %   Eosinophils Absolute 0.0 0 - 0 K/uL   Basophils Relative 0 %   Basophils Absolute 0.0 0 - 0 K/uL   RBC Morphology MORPHOLOGY UNREMARKABLE    Smear Review Normal platelet morphology    Other 6 %   Abs Immature Granulocytes 0.00 0.00 - 0.07 K/uL    Comment: Performed at [facility], [address] [place], [place] [address]  Comprehensive metabolic panel     Status: Abnormal   Collection Time: 02/10/20  6:10 AM  Result Value Ref Range   Sodium 139 135 - 145 mmol/L   Potassium 4.5 3.5 - 5.1 mmol/L   Chloride 103 98 - 111 mmol/L   CO2 27 22 - 32 mmol/L   Glucose, Bld 293 (H) 70 - 99 mg/dL    Comment: Glucose reference range applies only to samples taken after fasting for at least 8 hours.   BUN 25 (H) 8 - 23 mg/dL   Creatinine, Ser 4.78 0.44 - 1.00 mg/dL   Calcium 9.2 8.9 - 29.5 mg/dL   Total Protein 7.0 6.5 - 8.1 g/dL   Albumin 2.7 (L) 3.5 - 5.0 g/dL   AST 27 15 - 41 U/L   ALT 23 0 - 44 U/L   Alkaline Phosphatase 80 38 - 126 U/L   Total Bilirubin 0.6 0.3 - 1.2 mg/dL   GFR calc non Af Amer >60 >60 mL/min   GFR calc Af Amer >60 >60 mL/min   Anion gap 9 5 - 15    Comment: Performed at [facility], [address] [place], [place] [address]  C-reactive protein     Status: Abnormal   Collection Time: 02/10/20  6:10 AM  Result Value Ref Range   CRP 2.9 (H) <1.0 mg/dL    Comment: Performed at [facility], [address] [place], [place] [address]  Fibrin derivatives D-Dimer [facility] only)     Status: None   Collection Time: 02/10/20  6:10 AM  Result Value Ref Range   Fibrin derivatives D-dimer ([facility]) 421.04 0.00 - 499.00 ng/mL (FEU)     Comment: (NOTE) <> Exclusion of Venous Thromboembolism (VTE) - OUTPATIENT ONLY   (Emergency Department or [place])    0-499 ng/ml (FEU): With a low to intermediate pretest probability                      for VTE this test result excludes the diagnosis                      of VTE.   >499 ng/ml (FEU) : VTE not excluded; additional work up for VTE is  required.  <> Testing on Inpatients and Evaluation of Disseminated Intravascular   Coagulation (DIC) Reference Range:   0-499 ng/ml (FEU) Performed at [facility], [address] [place], [place] [address]   Ferritin     Status: Abnormal   Collection Time: 02/10/20  6:10 AM  Result Value Ref Range   Ferritin 581 (H) 11 - 307 ng/mL    Comment: Performed at [facility], [address] [place], [place] [address]  Magnesium     Status: None   Collection Time: 02/10/20  6:10 AM  Result Value Ref Range   Magnesium 1.9 1.7 - 2.4 mg/dL    Comment: Performed at [facility], [address] [place], [place] [address]  Phosphorus     Status: None   Collection Time: 02/10/20  6:10 AM  Result Value Ref Range   Phosphorus 3.4 2.5 - 4.6 mg/dL    Comment: Performed at [facility], [address] [place], [place] [address]  Pathologist smear review     Status: None   Collection Time: 02/10/20  6:10 AM  Result Value Ref Range   Path Review Blood smear is reviewed.     Comment: Absolute leukocytosis, with mature neutrophilia, and minimally left shifted maturation. Normocytic erythrocytes, with unremarkable RBC parameters. Normal platelet count and morphology. Patient history of acute hypoxic respiratory failure, secondary to COVID19 pneumonia, and treatment with steroids, are noted, both of which may result in neutrophilia. Clinical correlation is recommended. Reviewed by Beryle Quant, M.D. Performed at [facility], [address] [place], [place] [address]   Glucose,  capillary     Status: Abnormal   Collection Time: 02/10/20  7:29 AM  Result Value Ref Range   Glucose-Capillary 288 (H) 70 - 99 mg/dL    Comment: Glucose reference range applies only to samples taken after fasting for at least 8 hours.  Glucose, capillary     Status: Abnormal   Collection Time: 02/10/20 11:42 AM  Result Value Ref Range   Glucose-Capillary 269 (H) 70 - 99 mg/dL    Comment: Glucose reference range applies only to samples taken after fasting for at least 8 hours.  Glucose, capillary     Status: Abnormal   Collection Time: 02/10/20  3:44 PM  Result Value Ref Range   Glucose-Capillary 147 (H) 70 - 99 mg/dL    Comment: Glucose reference range applies only to samples taken after fasting for at least 8 hours.  Glucose, capillary     Status: Abnormal   Collection Time: 02/10/20  9:48 PM  Result Value Ref Range   Glucose-Capillary 162 (H) 70 - 99 mg/dL    Comment: Glucose reference range applies only to samples taken after fasting for at least 8 hours.  Glucose, capillary     Status: Abnormal   Collection Time: 02/11/20  7:53 AM  Result Value Ref Range   Glucose-Capillary 224 (H) 70 - 99 mg/dL    Comment: Glucose reference range applies only to samples taken after fasting for at least 8 hours.  Glucose, capillary     Status: Abnormal   Collection Time: 02/11/20 11:37 AM  Result Value Ref Range   Glucose-Capillary 192 (H) 70 - 99 mg/dL    Comment: Glucose reference range applies only to samples taken after fasting for at least 8 hours.  POCT HgB A1C     Status: Abnormal   Collection Time: 03/02/20 12:04 PM  Result Value Ref Range   Hemoglobin A1C  7.3 (A) 4.0 - 5.6 %   HbA1c POC (<> result, manual entry)     HbA1c, POC (prediabetic range)     HbA1c, POC (controlled diabetic range)      Diabetic Foot Exam: Diabetic Foot Exam - Simple   Simple Foot Form Diabetic Foot exam was performed with the following findings: Yes 03/02/2020 12:41 PM  Visual Inspection No  deformities, no ulcerations, no other skin breakdown bilaterally: Yes Sensation Testing Intact to touch and monofilament testing bilaterally: Yes Pulse Check Posterior Tibialis and Dorsalis pulse intact bilaterally: Yes Comments      PHQ2/9: Depression screen [facility] 03/02/2020 02/26/2020 02/04/2020 11/05/2019 10/06/2019  Decreased Interest 0 0 0 0 0  Down, Depressed, Hopeless 0 0 0 0 0  PHQ - 2 Score 0 0 0 0 0  Altered sleeping - - - 0 0  Tired, decreased energy - - - 0 0  Change in appetite - - - 0 0  Feeling bad or failure about yourself  - - - 0 0  Trouble concentrating - - - 0 0  Moving slowly or fidgety/restless - - - 0 0  Suicidal thoughts - - - 0 0  PHQ-9 Score - - - 0 0  Difficult doing work/chores - - - Not difficult at all -  Some recent data might be hidden    phq 9 is negative   Fall Risk: Fall Risk  02/26/2020 02/04/2020 11/05/2019 10/06/2019 09/05/2019  Falls in the past year? 0 0 0 0 0  Number falls in past yr: 0 0 0 0 0  Injury with Fall? 0 0 0 0 0  Comment - - - - -  Risk Factor Category  - - - - -  Risk for fall due to : - - - - -  Risk for fall due to: Comment - - - - -  Follow up - - Falls evaluation completed - -     Functional Status Survey: Is the patient deaf or have difficulty hearing?: No Does the patient have difficulty seeing, even when wearing glasses/contacts?: No Does the patient have difficulty concentrating, remembering, or making decisions?: No Does the patient have difficulty walking or climbing stairs?: No Does the patient have difficulty dressing or bathing?: No Does the patient have difficulty doing errands alone such as visiting a doctor's office or shopping?: No   Assessment & Plan  1. Type 2 diabetes mellitus with other diabetic kidney complication (HCC)  - POCT HgB A1C - HM Diabetes Foot Exam  2. Dyslipidemia   3. DM type 2 with diabetic peripheral neuropathy (HCC)   4. Hypertension associated with diabetes (HCC)   5.  Mucopurulent chronic bronchitis (HCC)  Continue Anoro   6. Vitamin D deficiency  Continue supplementation   7. B12 deficiency  On supplementation   8. Chronic bilateral low back pain without sciatica  - HYDROcodone-acetaminophen (NORCO/VICODIN) 5-325 MG tablet; Take 2 tablets by mouth 2 (two) times daily as needed for moderate pain.  Dispense: 30 tablet; Refill: 0  9. Spasm of muscle of lower back   10. Neurogenic claudication (HCC)  She is still having pain on lower legs with activity  - HYDROcodone-acetaminophen (NORCO/VICODIN) 5-325 MG tablet; Take 2 tablets by mouth 2 (two) times daily as needed for moderate pain.  Dispense: 30 tablet; Refill: 0  11. Atherosclerosis of aorta (HCC)  On statin therapy, LDL is at goal   12. RLS (restless legs syndrome)  Doing well on medication  13. Other atopic dermatitis  Under the care of [facility]  14. Microalbuminuria  - losartan (COZAAR) 25 MG tablet; Take 0.5 tablets (12.5 mg total) by mouth daily. In place of lisinopril  Dispense: 45 tablet; Refill: 1  15. Diabetes mellitus with microalbuminuria (HCC)  - losartan (COZAAR) 25 MG tablet; Take 0.5 tablets (12.5 mg total) by mouth daily. In place of lisinopril  Dispense: 45 tablet; Refill: 1   16. Need for immunization against influenza  - Flu Vaccine QUAD High Dose(Fluad)

## 2020-03-02 ENCOUNTER — Other Ambulatory Visit: Payer: Self-pay

## 2020-03-02 ENCOUNTER — Ambulatory Visit (INDEPENDENT_AMBULATORY_CARE_PROVIDER_SITE_OTHER): Payer: Medicare Other | Admitting: Family Medicine

## 2020-03-02 ENCOUNTER — Encounter: Payer: Self-pay | Admitting: Family Medicine

## 2020-03-02 VITALS — BP 108/70 | HR 101 | Temp 98.2°F | Resp 16 | Ht 64.0 in | Wt 165.8 lb

## 2020-03-02 DIAGNOSIS — E785 Hyperlipidemia, unspecified: Secondary | ICD-10-CM | POA: Diagnosis not present

## 2020-03-02 DIAGNOSIS — M545 Low back pain: Secondary | ICD-10-CM

## 2020-03-02 DIAGNOSIS — E538 Deficiency of other specified B group vitamins: Secondary | ICD-10-CM

## 2020-03-02 DIAGNOSIS — E1129 Type 2 diabetes mellitus with other diabetic kidney complication: Secondary | ICD-10-CM | POA: Diagnosis not present

## 2020-03-02 DIAGNOSIS — J411 Mucopurulent chronic bronchitis: Secondary | ICD-10-CM

## 2020-03-02 DIAGNOSIS — M48062 Spinal stenosis, lumbar region with neurogenic claudication: Secondary | ICD-10-CM

## 2020-03-02 DIAGNOSIS — E1159 Type 2 diabetes mellitus with other circulatory complications: Secondary | ICD-10-CM

## 2020-03-02 DIAGNOSIS — E1142 Type 2 diabetes mellitus with diabetic polyneuropathy: Secondary | ICD-10-CM

## 2020-03-02 DIAGNOSIS — M62830 Muscle spasm of back: Secondary | ICD-10-CM

## 2020-03-02 DIAGNOSIS — I10 Essential (primary) hypertension: Secondary | ICD-10-CM

## 2020-03-02 DIAGNOSIS — E559 Vitamin D deficiency, unspecified: Secondary | ICD-10-CM

## 2020-03-02 DIAGNOSIS — G9519 Other vascular myelopathies: Secondary | ICD-10-CM

## 2020-03-02 DIAGNOSIS — Z23 Encounter for immunization: Secondary | ICD-10-CM | POA: Diagnosis not present

## 2020-03-02 DIAGNOSIS — I700 Atherosclerosis of aorta: Secondary | ICD-10-CM

## 2020-03-02 DIAGNOSIS — R809 Proteinuria, unspecified: Secondary | ICD-10-CM

## 2020-03-02 DIAGNOSIS — G2581 Restless legs syndrome: Secondary | ICD-10-CM

## 2020-03-02 DIAGNOSIS — G8929 Other chronic pain: Secondary | ICD-10-CM

## 2020-03-02 DIAGNOSIS — L2089 Other atopic dermatitis: Secondary | ICD-10-CM

## 2020-03-02 LAB — POCT GLYCOSYLATED HEMOGLOBIN (HGB A1C): Hemoglobin A1C: 7.3 % — AB (ref 4.0–5.6)

## 2020-03-02 MED ORDER — HYDROCODONE-ACETAMINOPHEN 5-325 MG PO TABS: 2.0000 | ORAL_TABLET | Freq: Two times a day (BID) | ORAL | 0 refills | Status: DC | PRN

## 2020-03-02 MED ORDER — LOSARTAN POTASSIUM 25 MG PO TABS: 12.5000 mg | ORAL_TABLET | Freq: Every day | ORAL | 1 refills | Status: DC

## 2020-03-03 DIAGNOSIS — H168 Other keratitis: Secondary | ICD-10-CM | POA: Diagnosis not present

## 2020-03-05 DIAGNOSIS — M8580 Other specified disorders of bone density and structure, unspecified site: Secondary | ICD-10-CM | POA: Diagnosis not present

## 2020-03-05 DIAGNOSIS — I700 Atherosclerosis of aorta: Secondary | ICD-10-CM | POA: Diagnosis not present

## 2020-03-05 DIAGNOSIS — Z7984 Long term (current) use of oral hypoglycemic drugs: Secondary | ICD-10-CM | POA: Diagnosis not present

## 2020-03-05 DIAGNOSIS — K219 Gastro-esophageal reflux disease without esophagitis: Secondary | ICD-10-CM | POA: Diagnosis not present

## 2020-03-05 DIAGNOSIS — I10 Essential (primary) hypertension: Secondary | ICD-10-CM | POA: Diagnosis not present

## 2020-03-05 DIAGNOSIS — E785 Hyperlipidemia, unspecified: Secondary | ICD-10-CM | POA: Diagnosis not present

## 2020-03-05 DIAGNOSIS — U071 COVID-19: Secondary | ICD-10-CM | POA: Diagnosis not present

## 2020-03-05 DIAGNOSIS — Z7952 Long term (current) use of systemic steroids: Secondary | ICD-10-CM | POA: Diagnosis not present

## 2020-03-05 DIAGNOSIS — Z79899 Other long term (current) drug therapy: Secondary | ICD-10-CM | POA: Diagnosis not present

## 2020-03-05 DIAGNOSIS — J1282 Pneumonia due to coronavirus disease 2019: Secondary | ICD-10-CM | POA: Diagnosis not present

## 2020-03-05 DIAGNOSIS — Z9981 Dependence on supplemental oxygen: Secondary | ICD-10-CM | POA: Diagnosis not present

## 2020-03-05 DIAGNOSIS — F17210 Nicotine dependence, cigarettes, uncomplicated: Secondary | ICD-10-CM | POA: Diagnosis not present

## 2020-03-05 DIAGNOSIS — M5417 Radiculopathy, lumbosacral region: Secondary | ICD-10-CM | POA: Diagnosis not present

## 2020-03-05 DIAGNOSIS — Z9181 History of falling: Secondary | ICD-10-CM | POA: Diagnosis not present

## 2020-03-05 DIAGNOSIS — E119 Type 2 diabetes mellitus without complications: Secondary | ICD-10-CM | POA: Diagnosis not present

## 2020-03-05 DIAGNOSIS — J440 Chronic obstructive pulmonary disease with acute lower respiratory infection: Secondary | ICD-10-CM | POA: Diagnosis not present

## 2020-03-05 DIAGNOSIS — Z7982 Long term (current) use of aspirin: Secondary | ICD-10-CM | POA: Diagnosis not present

## 2020-03-05 DIAGNOSIS — E039 Hypothyroidism, unspecified: Secondary | ICD-10-CM | POA: Diagnosis not present

## 2020-03-06 IMAGING — CT CT CHEST LUNG CANCER SCREENING LOW DOSE W/O CM
2 of 5 series · 15 of 40 positions shown, 18 images · non-contrast
Comparison: 05/01/2017 screening chest CT. 05/25/2017 chest
radiograph.

CLINICAL DATA: 62-year-old asymptomatic female current smoker with
35 pack-year smoking history.

EXAM:
CT CHEST WITHOUT CONTRAST LOW-DOSE FOR LUNG CANCER SCREENING
TECHNIQUE: Multidetector CT imaging of the chest was performed following the
standard protocol without IV contrast.

[Series 5: lung · axial · 0.65mm/px · z∈[-1201,-945]mm · 12 of 286 slices shown, 15 images (1 of 2)]
[im 15/286  mediastinal]
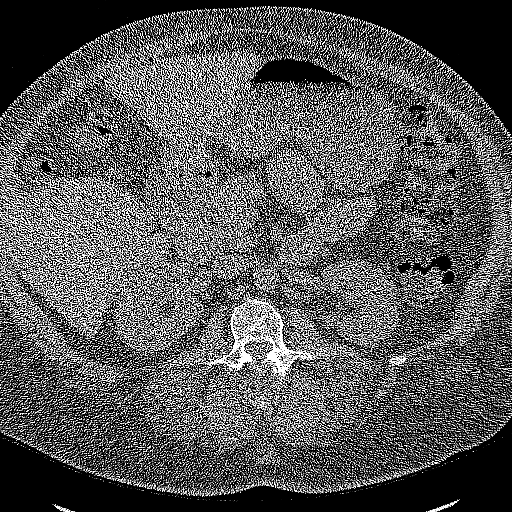
[im 15/286  lung]
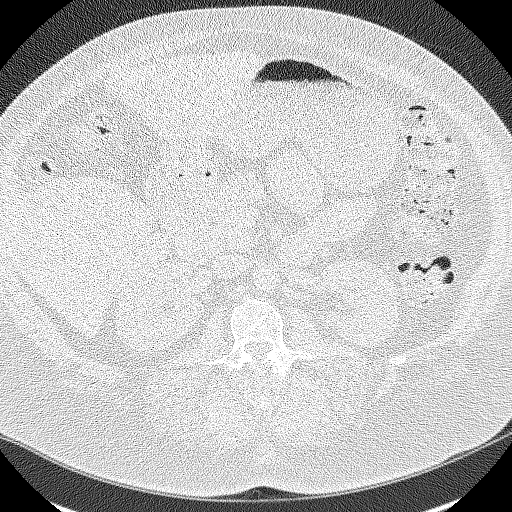
[im 43/286  lung]
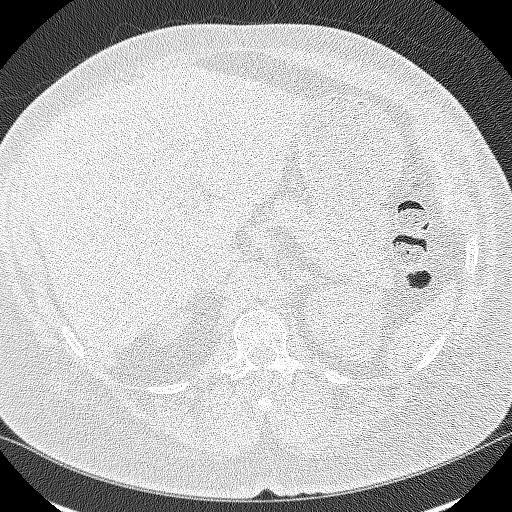
[im 58/286  lung]
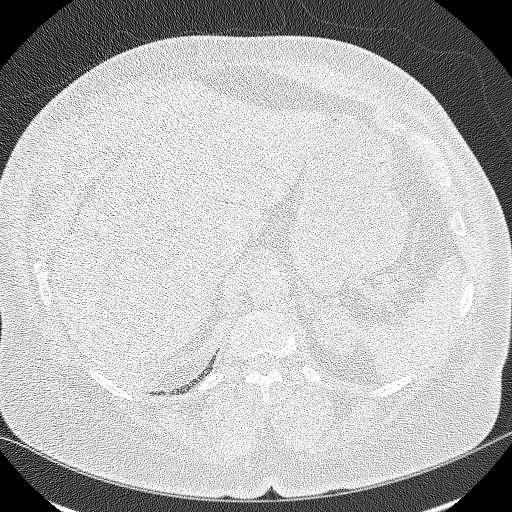
[im 86/286  lung]
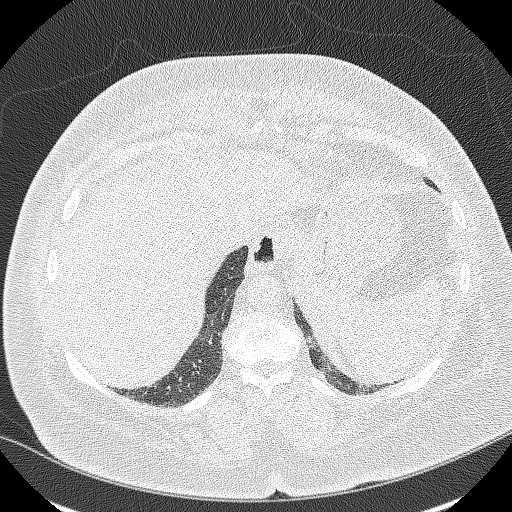
[im 115/286  mediastinal]
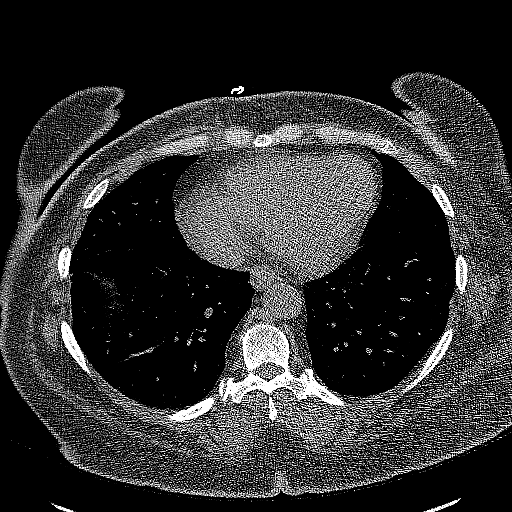
[im 115/286  lung]
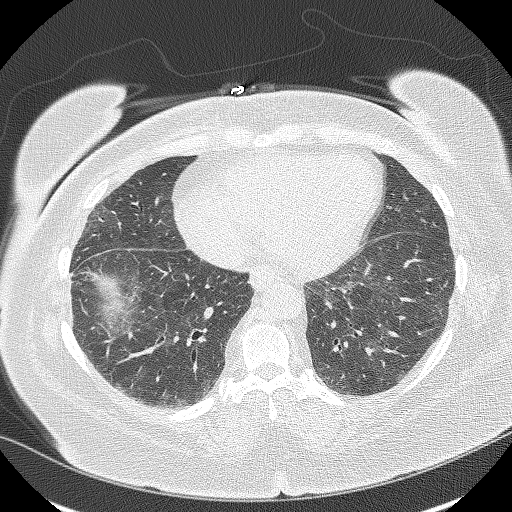
[im 129/286  lung]
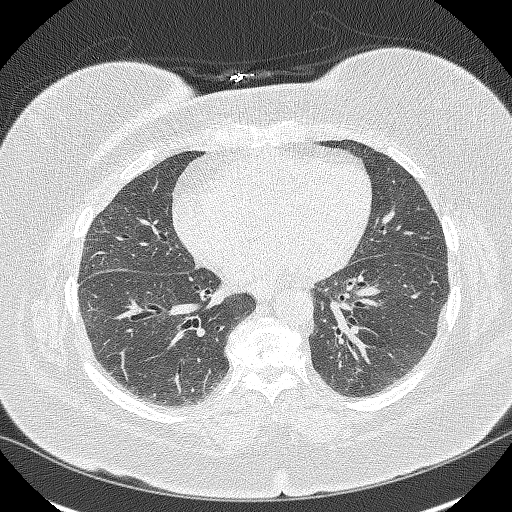
[im 157/286  lung]
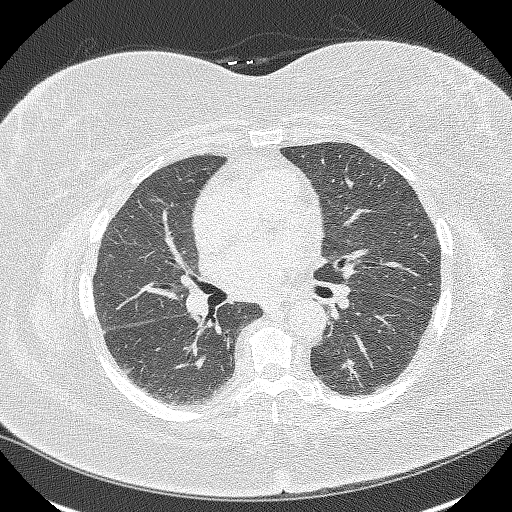
[im 172/286  lung]
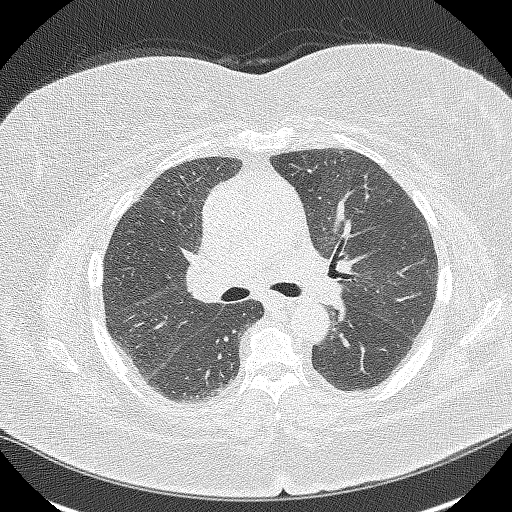
[im 200/286  mediastinal]
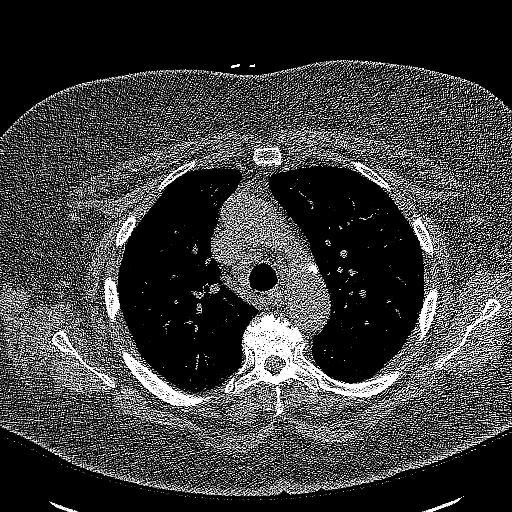
[im 200/286  lung]
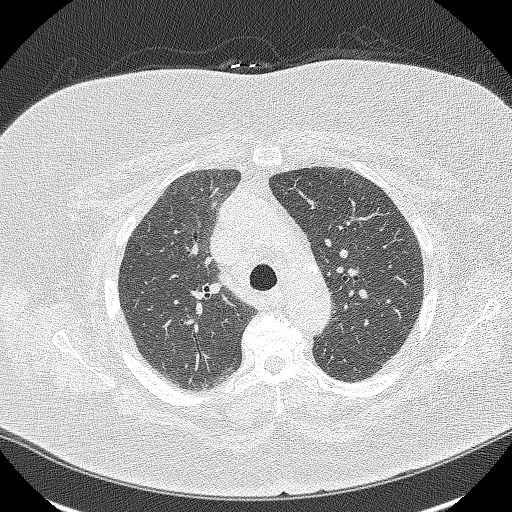
[im 229/286  lung]
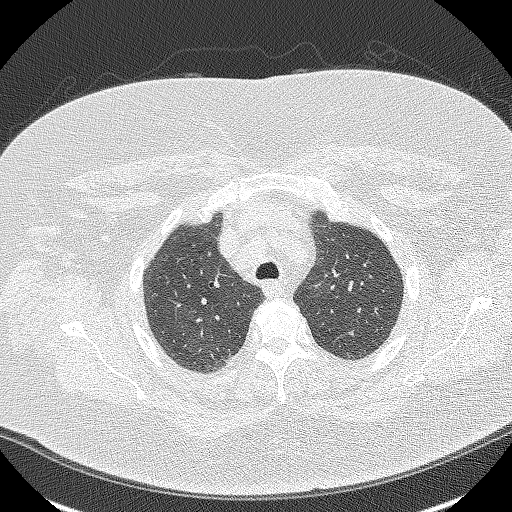
[im 243/286  lung]
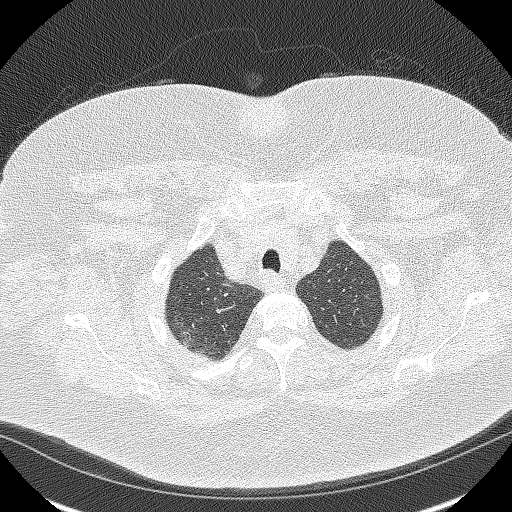
[im 271/286  lung]
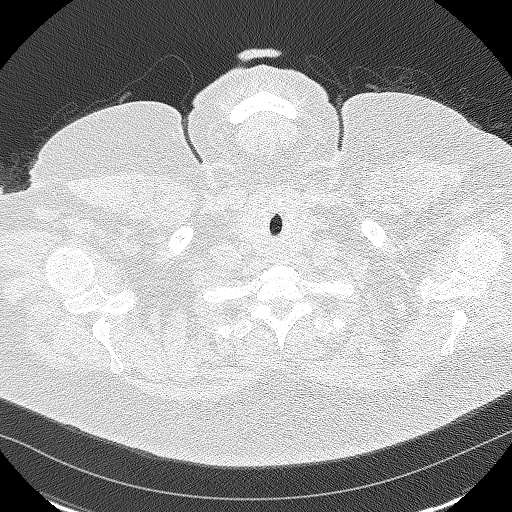

[Series 6: lung · coronal · 0.56mm/px · 3 of 319 slices shown (2 of 2)]
[im 64/319  lung]
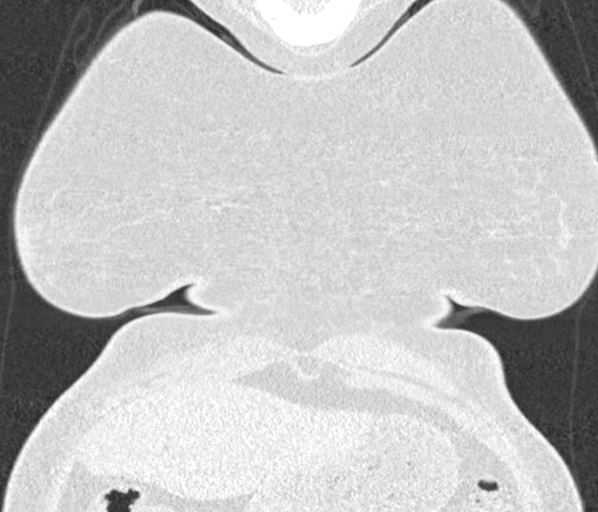
[im 128/319  lung]
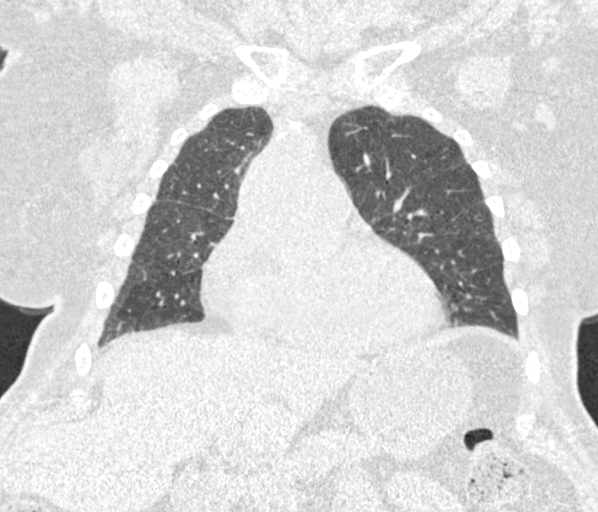
[im 191/319  lung]
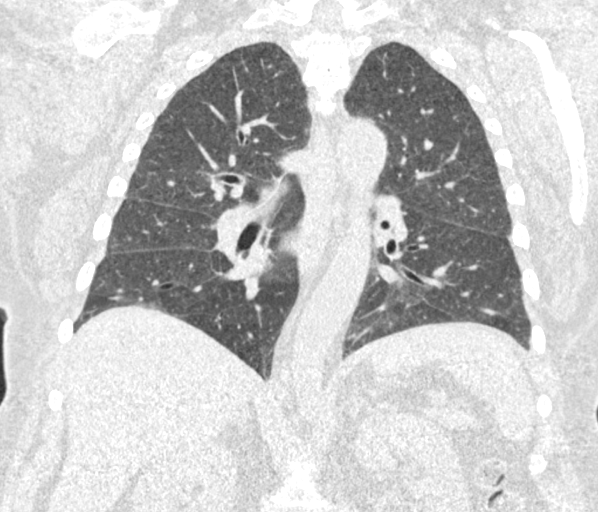

[15 of 40 positions shown; findings below may reference images not displayed]

FINDINGS: Cardiovascular: Normal heart size. No significant pericardial
effusion/thickening. Atherosclerotic nonaneurysmal thoracic aorta.
Normal caliber pulmonary arteries.

Mediastinum/Nodes: No discrete thyroid nodules. Unremarkable
esophagus. No pathologically enlarged axillary, mediastinal or hilar
lymph nodes, noting limited sensitivity for the detection of hilar
adenopathy on this noncontrast study.

Lungs/Pleura: No pneumothorax. No pleural effusion. Mild
centrilobular emphysema with mild diffuse bronchial wall thickening.
No acute consolidative airspace disease or lung masses. No
significant growth of previously visualized scattered small
pulmonary nodules. No new significant pulmonary nodules.

Upper abdomen: No acute abnormality.

Musculoskeletal: No aggressive appearing focal osseous lesions.
Marked thoracic spondylosis.
IMPRESSION: Lung-RADS 2, benign appearance or behavior. Continue annual
screening with low-dose chest CT without contrast in 12 months.

Aortic Atherosclerosis (H32CM-6TO.O) and Emphysema (H32CM-VLR.Y).

## 2020-03-08 DIAGNOSIS — M5417 Radiculopathy, lumbosacral region: Secondary | ICD-10-CM | POA: Diagnosis not present

## 2020-03-08 DIAGNOSIS — E785 Hyperlipidemia, unspecified: Secondary | ICD-10-CM | POA: Diagnosis not present

## 2020-03-08 DIAGNOSIS — Z7984 Long term (current) use of oral hypoglycemic drugs: Secondary | ICD-10-CM | POA: Diagnosis not present

## 2020-03-08 DIAGNOSIS — E119 Type 2 diabetes mellitus without complications: Secondary | ICD-10-CM | POA: Diagnosis not present

## 2020-03-08 DIAGNOSIS — U071 COVID-19: Secondary | ICD-10-CM | POA: Diagnosis not present

## 2020-03-08 DIAGNOSIS — I10 Essential (primary) hypertension: Secondary | ICD-10-CM | POA: Diagnosis not present

## 2020-03-08 DIAGNOSIS — Z7952 Long term (current) use of systemic steroids: Secondary | ICD-10-CM | POA: Diagnosis not present

## 2020-03-08 DIAGNOSIS — Z9181 History of falling: Secondary | ICD-10-CM | POA: Diagnosis not present

## 2020-03-08 DIAGNOSIS — M8580 Other specified disorders of bone density and structure, unspecified site: Secondary | ICD-10-CM | POA: Diagnosis not present

## 2020-03-08 DIAGNOSIS — I700 Atherosclerosis of aorta: Secondary | ICD-10-CM | POA: Diagnosis not present

## 2020-03-08 DIAGNOSIS — K219 Gastro-esophageal reflux disease without esophagitis: Secondary | ICD-10-CM | POA: Diagnosis not present

## 2020-03-08 DIAGNOSIS — Z7982 Long term (current) use of aspirin: Secondary | ICD-10-CM | POA: Diagnosis not present

## 2020-03-08 DIAGNOSIS — Z79899 Other long term (current) drug therapy: Secondary | ICD-10-CM | POA: Diagnosis not present

## 2020-03-08 DIAGNOSIS — F17210 Nicotine dependence, cigarettes, uncomplicated: Secondary | ICD-10-CM | POA: Diagnosis not present

## 2020-03-08 DIAGNOSIS — Z9981 Dependence on supplemental oxygen: Secondary | ICD-10-CM | POA: Diagnosis not present

## 2020-03-08 DIAGNOSIS — E039 Hypothyroidism, unspecified: Secondary | ICD-10-CM | POA: Diagnosis not present

## 2020-03-08 DIAGNOSIS — J440 Chronic obstructive pulmonary disease with acute lower respiratory infection: Secondary | ICD-10-CM | POA: Diagnosis not present

## 2020-03-08 DIAGNOSIS — J1282 Pneumonia due to coronavirus disease 2019: Secondary | ICD-10-CM | POA: Diagnosis not present

## 2020-03-12 DIAGNOSIS — J9601 Acute respiratory failure with hypoxia: Secondary | ICD-10-CM | POA: Diagnosis not present

## 2020-03-12 DIAGNOSIS — R531 Weakness: Secondary | ICD-10-CM | POA: Diagnosis not present

## 2020-04-01 DIAGNOSIS — Z79899 Other long term (current) drug therapy: Secondary | ICD-10-CM | POA: Diagnosis not present

## 2020-04-01 DIAGNOSIS — L2084 Intrinsic (allergic) eczema: Secondary | ICD-10-CM | POA: Diagnosis not present

## 2020-04-27 ENCOUNTER — Encounter: Payer: Self-pay | Admitting: Family Medicine

## 2020-04-27 ENCOUNTER — Other Ambulatory Visit: Payer: Self-pay

## 2020-04-27 ENCOUNTER — Telehealth (INDEPENDENT_AMBULATORY_CARE_PROVIDER_SITE_OTHER): Payer: Medicare Other | Admitting: Family Medicine

## 2020-04-27 DIAGNOSIS — J310 Chronic rhinitis: Secondary | ICD-10-CM | POA: Diagnosis not present

## 2020-04-27 DIAGNOSIS — J302 Other seasonal allergic rhinitis: Secondary | ICD-10-CM

## 2020-04-27 DIAGNOSIS — J329 Chronic sinusitis, unspecified: Secondary | ICD-10-CM | POA: Diagnosis not present

## 2020-04-27 MED ORDER — DOXYCYCLINE HYCLATE 100 MG PO TABS: 100.0000 mg | ORAL_TABLET | Freq: Two times a day (BID) | ORAL | 0 refills | Status: AC

## 2020-04-27 MED ORDER — CETIRIZINE HCL 10 MG PO TABS: 10.0000 mg | ORAL_TABLET | Freq: Every day | ORAL | 11 refills | Status: DC

## 2020-04-27 NOTE — Progress Notes (Signed)
Name: Julie Wall   MRN: [id number]    DOB: 12/13/1955   Date:04/27/2020       Progress Note  Subjective:    Chief Complaint  Chief Complaint  Patient presents with  . URI    cough, congested for 3 days    I connected with  Zakeya Rudzinski on 04/27/20 at  3:00 PM EST by telephone and verified that I am speaking with the correct person using two identifiers.   I discussed the limitations, risks, security and privacy concerns of performing an evaluation and management service by telephone and the availability of in person appointments. Staff also discussed with the patient that there may be a patient responsible charge related to this service.  Patient verbalized understanding and agreed to proceed with encounter. Patient Location: home Provider Location: [facility]  Additional Individuals present: none  HPI  Sx started Sunday, 3d ago, nasal congestion, drainage, can't breath through nose, sneezing, post-nasal drainage, a little bit of coughing due to the drainage down her throat, she denies any chest congestion wheeze shortness of breath, chest pain, sweats or fever She's tried Alka-Seltzer sinus, cough syrup  Hx of allergies around this time of year -  Fluticasone nasal spray - dr Carlynn Purl gave her, she tried it in the past and is not currently taking it because she says it did not help much, she is not on any allergy medications or antihistamines No fever, HA, sore throat, SOB, wheeze, H/C chills, sweats, exertional symptoms, fatigue She denies any sick contacts  Recent history of Covid infection about 2 months ago with bilateral pneumonia and respiratory failure Patient is a former smoker, has pertinent current medical history of diabetes, hypertension, hyperlipidemia, chronic back and leg pain, restless leg   Patient Active Problem List   Diagnosis Date Noted  . Acute respiratory failure with hypoxemia (HCC)   . Hypotension   . Diabetic polyneuropathy associated with type 2  diabetes mellitus (HCC)   . Weakness   . Pneumonia due to COVID-19 virus 02/05/2020  . Hilar lymphadenopathy 02/05/2020  . Morbid obesity (HCC) 06/26/2018  . Atherosclerosis of aorta (HCC) 05/04/2017  . Pap smear abnormality of cervix with ASCUS favoring benign 04/23/2017  . Vitamin D deficiency 12/14/2016  . Obesity (BMI 30.0-34.9) 12/13/2015  . Chronic bronchitis (HCC) 05/14/2015  . Diabetes mellitus (HCC) 05/14/2015  . Positive H. pylori test 04/05/2015  . Rapid urease test for Helicobacter pylori infection positive 04/05/2015  . Umbilical hernia 04/01/2015  . Exomphalos 04/01/2015  . RLS (restless legs syndrome) 01/08/2015  . Type 2 diabetes mellitus with hyperlipidemia (HCC) 01/08/2015  . Neurogenic claudication (HCC) 01/08/2015  . AD (atopic dermatitis) 12/28/2014  . Chronic LBP 12/28/2014  . Dyslipidemia 12/28/2014  . Gastroesophageal reflux disease without esophagitis 12/28/2014  . Microalbuminuria 12/28/2014  . Disorder of bone and cartilage 12/28/2014  . Neuralgia of left thigh 12/28/2014  . Perennial allergic rhinitis with seasonal variation 12/28/2014  . Tobacco abuse 12/28/2014  . Mononeuropathy of left lower extremity 12/28/2014  . Hypothyroidism 09/22/2005  . Hypertension, benign 09/22/2005    Social History   Tobacco Use  . Smoking status: Former Smoker    Packs/day: 0.50    Years: 46.00    Pack years: 23.00    Types: Cigarettes    Start date: 01/07/1985    Quit date: 02/01/2020    Years since quitting: 0.2  . Smokeless tobacco: Never Used  Substance Use Topics  . Alcohol use: No    Alcohol/week:  0.0 standard drinks     Current Outpatient Medications:  .  albuterol (VENTOLIN HFA) 108 (90 Base) MCG/ACT inhaler, Inhale 2 puffs into the lungs every 6 (six) hours as needed for wheezing or shortness of breath., Disp: 18 g, Rfl: 0 .  ascorbic acid (VITAMIN C) 500 MG tablet, Take 1 tablet (500 mg total) by mouth daily., Disp: 30 tablet, Rfl: 2 .  aspirin 81  MG chewable tablet, Chew 1 tablet by mouth daily., Disp: , Rfl:  .  atorvastatin (LIPITOR) 40 MG tablet, Take 1 tablet (40 mg total) by mouth daily. In place of simvastatin, Disp: 90 tablet, Rfl: 1 .  cholecalciferol (VITAMIN D3) 25 MCG (1000 UNIT) tablet, Take 1 tablet (1,000 Units total) by mouth daily., Disp: 90 tablet, Rfl: 1 .  dorzolamide-timolol (COSOPT) 22.3-6.8 MG/ML ophthalmic solution, Place 1 drop into both eyes 2 (two) times daily. , Disp: , Rfl:  .  empagliflozin (JARDIANCE) 25 MG TABS tablet, Take 1 tablet (25 mg total) by mouth daily., Disp: 90 tablet, Rfl: 1 .  erythromycin ophthalmic ointment, SMARTSIG:In Eye(s), Disp: , Rfl:  .  folic acid (FOLVITE) 1 MG tablet, Take 1 mg by mouth daily. , Disp: , Rfl:  .  gabapentin (NEURONTIN) 100 MG capsule, Take 200 mg by mouth 3 (three) times daily., Disp: , Rfl:  .  glucose blood test strip, Check blood sugar once daily fasting, and up to 4 times daily PRN (Patient taking differently: Check blood sugar once daily fasting, and up to 4 times daily PRN ACCU CHEK smartview), Disp: 100 each, Rfl: 2 .  HYDROcodone-acetaminophen (NORCO/VICODIN) 5-325 MG tablet, Take 2 tablets by mouth 2 (two) times daily as needed for moderate pain., Disp: 30 tablet, Rfl: 0 .  Lancet Devices (SIMPLE DIAGNOSTICS LANCING DEV) MISC, Check blood sugar once daily fasting, and up to 4 times daily PRN., Disp: 100 each, Rfl: 2 .  losartan (COZAAR) 25 MG tablet, Take 0.5 tablets (12.5 mg total) by mouth daily. In place of lisinopril, Disp: 45 tablet, Rfl: 1 .  metFORMIN (GLUCOPHAGE-XR) 750 MG 24 hr tablet, Take 2 tablets (1,500 mg total) by mouth daily with breakfast., Disp: 180 tablet, Rfl: 1 .  methotrexate (RHEUMATREX) 2.5 MG tablet, Take 15 mg by mouth every Friday. , Disp: , Rfl:  .  POLYTRIM ophthalmic solution, Place into the left eye., Disp: , Rfl:  .  rOPINIRole (REQUIP XL) 4 MG 24 hr tablet, Take 4 mg by mouth at bedtime. , Disp: , Rfl:  .  triamcinolone ointment  (KENALOG) 0.1 %, Apply topically 2 (two) times daily., Disp: 30 g, Rfl: 0 .  umeclidinium-vilanterol (ANORO ELLIPTA) 62.5-25 MCG/INH AEPB, Inhale 1 puff into the lungs daily., Disp: 180 each, Rfl: 2 .  VIGAMOX 0.5 % ophthalmic solution, , Disp: , Rfl:  .  zinc sulfate 220 (50 Zn) MG capsule, Take 1 capsule (220 mg total) by mouth daily., Disp: 30 capsule, Rfl: 2  Allergies  Allergen Reactions  . Penicillins Rash    Chart Review: I personally reviewed active problem list, medication list, allergies, family history, social history, health maintenance, notes from last encounter, lab results, imaging with the patient/caregiver today.  Review of Systems 10 Systems reviewed and are negative for acute change except as noted in the HPI.   Objective:    Virtual encounter, vitals limited, only able to obtain the following There were no vitals filed for this visit. There is no height or weight on file to calculate BMI. Nursing Note  and Vital Signs reviewed.  Physical Exam Vitals and nursing note reviewed.  Neck:     Comments: Phonation slightly nasal and scratchy frequent snorting and sniffing throughout telephone encounter Pulmonary:     Comments: Able to speak in full and complete sentences, no respiratory distress, no audible wheeze or stridor Neurological:     Mental Status: She is alert.  Psychiatric:        Mood and Affect: Mood normal.        Behavior: Behavior normal.     PE limited by telephone encounter  No results found for this or any previous visit (from the past 72 hour(s)).  Assessment and Plan:     ICD-10-CM   1. Rhinosinusitis  J31.0 cetirizine (ZYRTEC) 10 MG tablet   J32.9 doxycycline (VIBRA-TABS) 100 MG tablet   suspect allergic, 3d of sx, no sick contacts   2. Seasonal allergies  J30.2     Patient of Dr. Carlynn Purl presents for telephone encounter for 3 days of nasal congestion and discharge, multiple comorbidities and recent pertinent and concerning history  of respiratory failure and Covid pneumonia, diabetes with complications, history of chronic bronchitis, former smoker, morbid obesity I did explain to the patient that she should restart her medication which would manage her nasal and seasonal allergies including but not limited to a second-generation antihistamine like Zyrtec Claritin or Allegra, encouraged her to use over-the-counter cough medication such as Delsym and or Mucinex, she seems to have a lot of fluid and sneezing and reactivity did encourage her to try Benadryl PRN if needed for severe fluid production from nose or severe sneezing She does not currently have any symptoms which are concerning for recurrent pneumonia and she denies any shortness of breath or fatigue She says she cannot breathe through her nose and continues to sniff and snoring throughout the encounter asking if I can hear it she is having difficulty breathing through her nostrils but she has no shortness of breath or trouble breathing otherwise Did encourage her to restart her Flonase nasal spray in addition to antihistamines and if she has any worsening of her symptoms which I described to her very specifically as sudden and severe -pain in her face or sinuses or onset of fever that she could start those antibiotics, but she may want to wait and see if she improves without them first  Did asked the patient to follow-up with me or Dr. Carlynn Purl if she is not gradually improving in the next 2 weeks   -Red flags and when to present for emergency care or RTC including but not limited to new/worsening/un-resolving symptoms,  reviewed with patient at time of visit. Follow up and care instructions discussed and provided in AVS. - I discussed the assessment and treatment plan with the patient. The patient was provided an opportunity to ask questions and all were answered. The patient agreed with the plan and demonstrated an understanding of the instructions.  - The patient was advised  to call back or seek an in-person evaluation if the symptoms worsen or if the condition fails to improve as anticipated.  I provided 20+ minutes of non-face-to-face time during this encounter.  Danelle Berry, PA-C 04/27/20 3:34 PM

## 2020-05-05 ENCOUNTER — Other Ambulatory Visit: Payer: Self-pay | Admitting: *Deleted

## 2020-05-05 DIAGNOSIS — Z122 Encounter for screening for malignant neoplasm of respiratory organs: Secondary | ICD-10-CM

## 2020-05-05 DIAGNOSIS — J9601 Acute respiratory failure with hypoxia: Secondary | ICD-10-CM | POA: Diagnosis not present

## 2020-05-05 DIAGNOSIS — R531 Weakness: Secondary | ICD-10-CM | POA: Diagnosis not present

## 2020-05-05 DIAGNOSIS — Z87891 Personal history of nicotine dependence: Secondary | ICD-10-CM

## 2020-05-05 NOTE — Progress Notes (Signed)
Contacted and scheduled for annual lung screening scan. Patient is a former smoker, quit 01/18/20, 35.5 pack year history.

## 2020-05-06 ENCOUNTER — Telehealth: Payer: Medicare Other

## 2020-05-06 DIAGNOSIS — M5417 Radiculopathy, lumbosacral region: Secondary | ICD-10-CM | POA: Diagnosis not present

## 2020-05-06 DIAGNOSIS — G5712 Meralgia paresthetica, left lower limb: Secondary | ICD-10-CM | POA: Diagnosis not present

## 2020-05-06 DIAGNOSIS — M5414 Radiculopathy, thoracic region: Secondary | ICD-10-CM | POA: Diagnosis not present

## 2020-05-17 ENCOUNTER — Other Ambulatory Visit: Payer: Self-pay

## 2020-05-17 ENCOUNTER — Ambulatory Visit
Admission: RE | Admit: 2020-05-17 | Discharge: 2020-05-17 | Disposition: A | Discharge status: Home / Self Care | Payer: Medicare Other | Source: Ambulatory Visit | Attending: Oncology | Admitting: Oncology

## 2020-05-17 DIAGNOSIS — F17210 Nicotine dependence, cigarettes, uncomplicated: Secondary | ICD-10-CM | POA: Diagnosis not present

## 2020-05-17 DIAGNOSIS — Z122 Encounter for screening for malignant neoplasm of respiratory organs: Secondary | ICD-10-CM | POA: Diagnosis not present

## 2020-05-17 DIAGNOSIS — Z87891 Personal history of nicotine dependence: Secondary | ICD-10-CM

## 2020-05-19 ENCOUNTER — Encounter: Payer: Self-pay | Admitting: *Deleted

## 2020-05-19 ENCOUNTER — Other Ambulatory Visit: Payer: Self-pay | Admitting: Family Medicine

## 2020-05-19 DIAGNOSIS — J8489 Other specified interstitial pulmonary diseases: Secondary | ICD-10-CM

## 2020-05-19 DIAGNOSIS — J411 Mucopurulent chronic bronchitis: Secondary | ICD-10-CM

## 2020-05-20 IMAGING — CT CT RENAL STONE PROTOCOL
2 of 4 series · 16 of 46 positions shown, 18 images · non-contrast
Comparison: None.

CLINICAL DATA: HX c-section, c/o left side flank pain x 1 month.
Denies n/v/d, no hx kidney stones. Micro hematuria. TKV

EXAM:
CT ABDOMEN AND PELVIS WITHOUT CONTRAST
TECHNIQUE: Multidetector CT imaging of the abdomen and pelvis was performed
following the standard protocol without IV contrast.

[Series 2: renal stone · axial · 0.73mm/px · z∈[-1418,-1048]mm · 13 of 82 slices shown, 15 images (1 of 2)]
[im 4/82  soft-tissue]
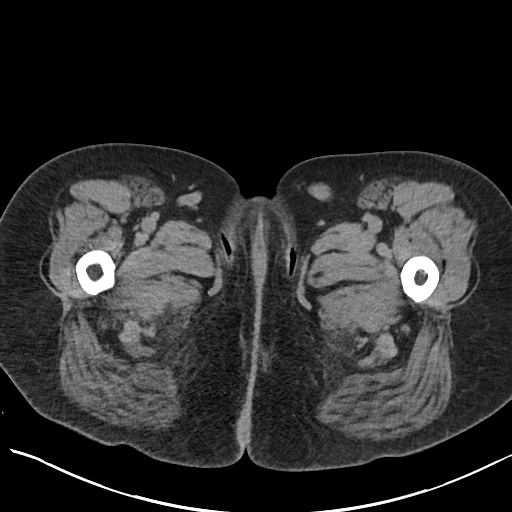
[im 4/82  bone]
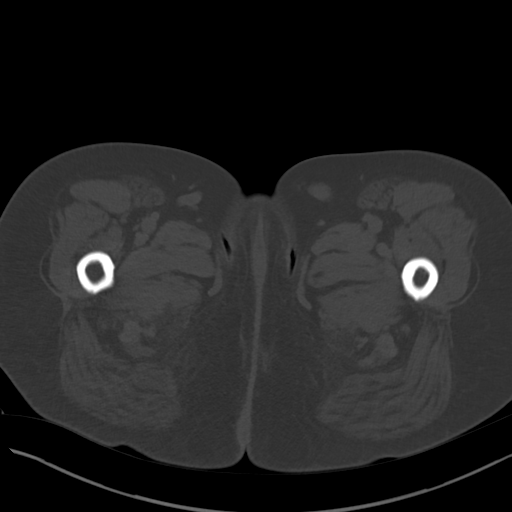
[im 10/82  soft-tissue]
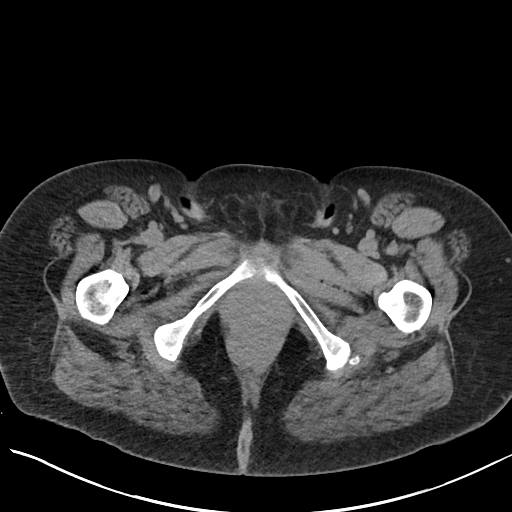
[im 17/82  soft-tissue]
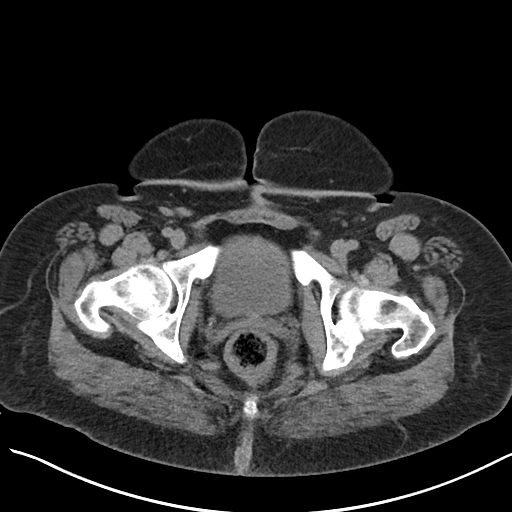
[im 23/82  soft-tissue]
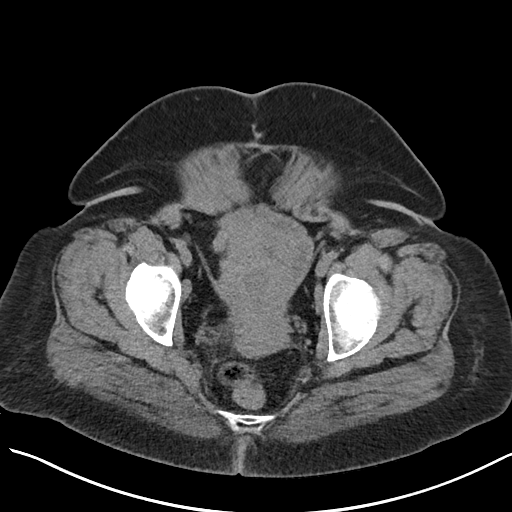
[im 30/82  soft-tissue]
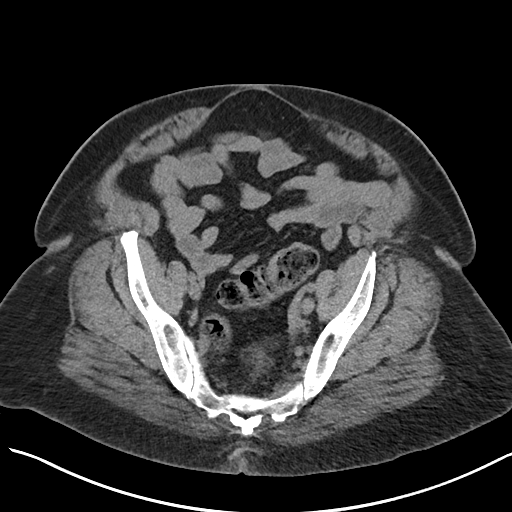
[im 36/82  soft-tissue]
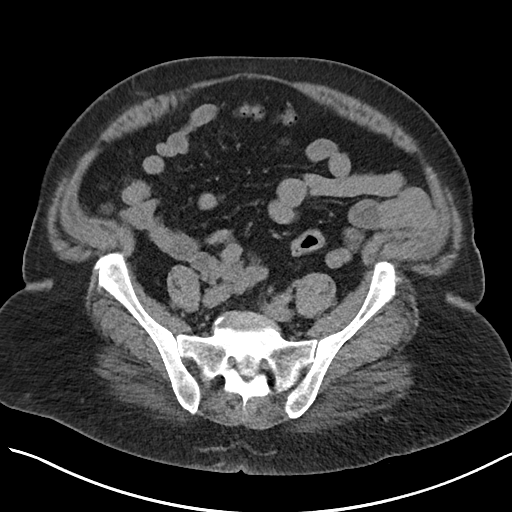
[im 43/82  soft-tissue]
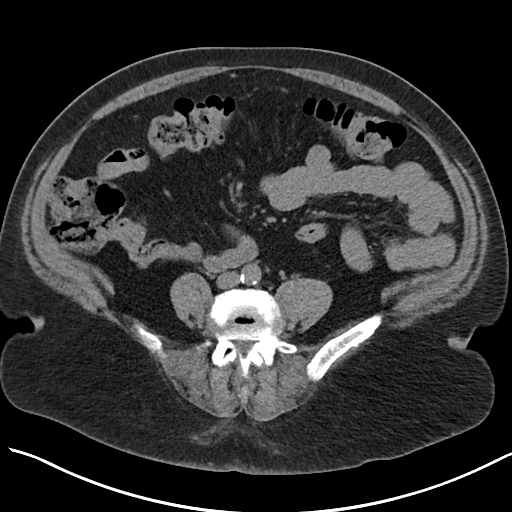
[im 46/82  soft-tissue]
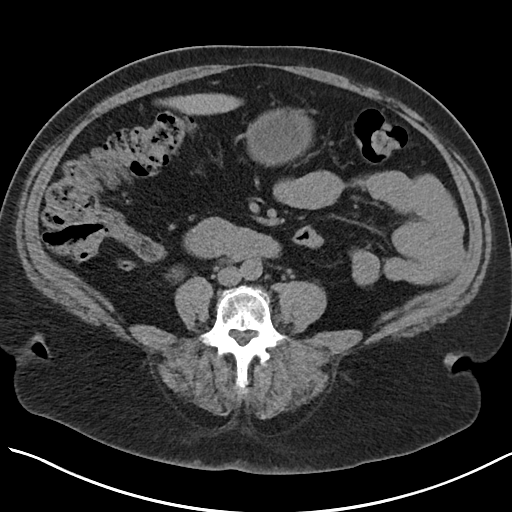
[im 52/82  soft-tissue]
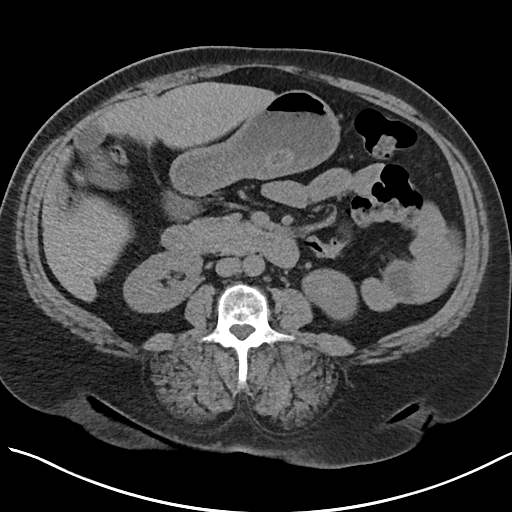
[im 52/82  bone]
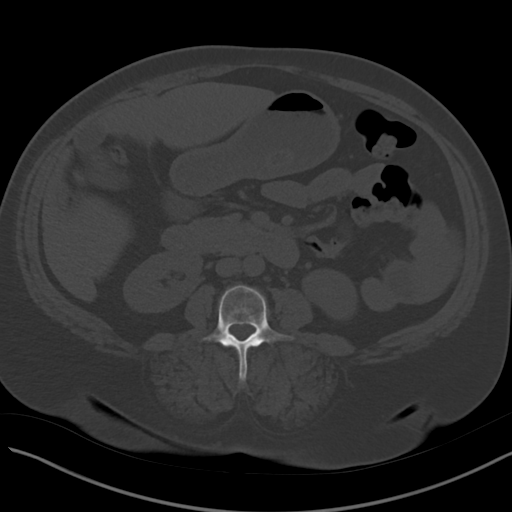
[im 59/82  soft-tissue]
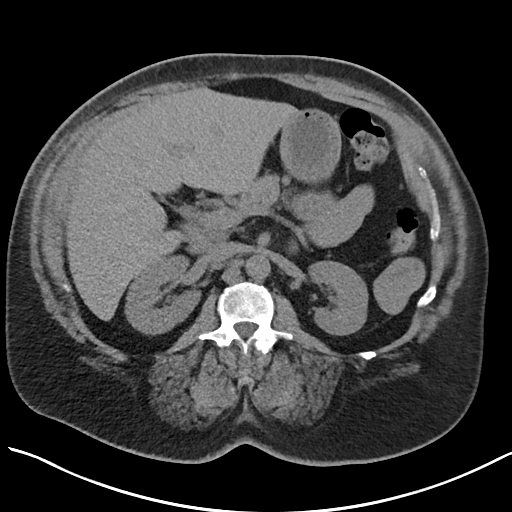
[im 65/82  soft-tissue]
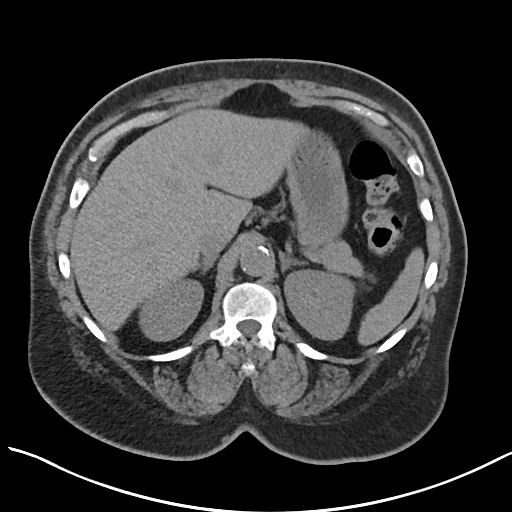
[im 72/82  soft-tissue]
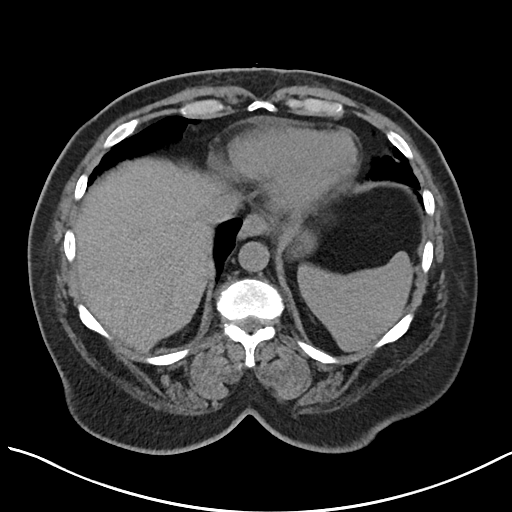
[im 78/82  soft-tissue]
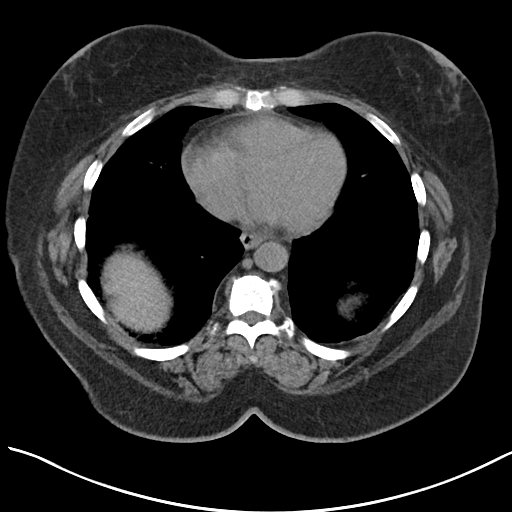

[Series 4: renal stone · coronal · 0.73mm/px · 3 of 161 slices shown (2 of 2)]
[im 54/161  soft-tissue]
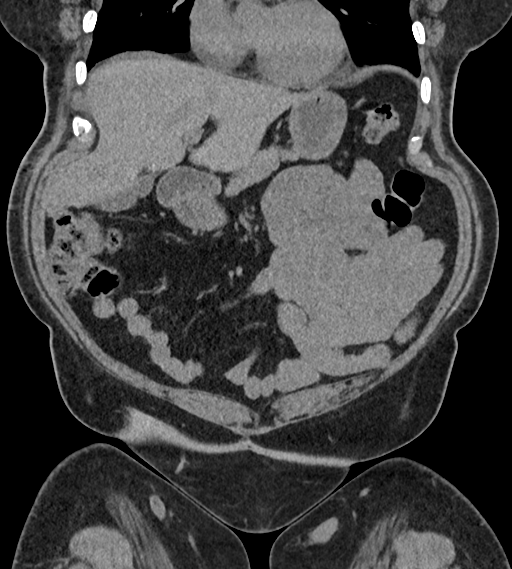
[im 72/161  soft-tissue]
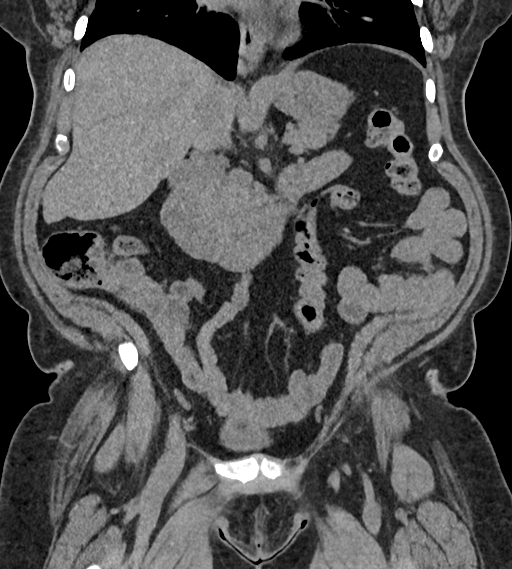
[im 89/161  soft-tissue]
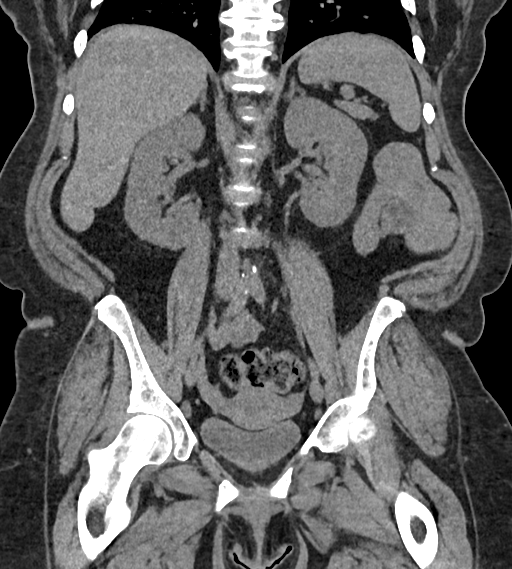

[16 of 46 positions shown; findings below may reference images not displayed]

FINDINGS: Lower chest: There are focal ground-glass opacities at the lung
bases, consistent with alveolitis. No pleural effusions. Heart size
is normal.

Hepatobiliary: No focal liver abnormality is seen. No radiopaque
gallstones, biliary dilatation, or pericholecystic inflammatory
changes.

Pancreas: Unremarkable. No pancreatic ductal dilatation or
surrounding inflammatory changes.

Spleen: Normal in size without focal abnormality.

Adrenals/Urinary Tract: Adrenal glands are unremarkable. Kidneys are
normal, without renal calculi, focal lesion, or hydronephrosis.
Bladder is unremarkable.

Stomach/Bowel: Normal appearance of the stomach. Small bowel loops
are unremarkable. Moderate stool burden. The appendix is well seen
and has a normal appearance.

Vascular/Lymphatic: There is atherosclerotic calcification of the
abdominal aorta. No associated aneurysm. No retroperitoneal or
mesenteric adenopathy.

Reproductive: The uterus is present. No adnexal mass.

Other: Small paraumbilical fat containing hernia. No ascites.

Musculoskeletal: Degenerative changes are seen in the LOWER thoracic
and lumbar spine. Vacuum disc identified at L5-S1.
IMPRESSION: 1. No renal or ureteral stones.
2. Normal appendix.
3. Moderate stool burden.
4. Small fat containing paraumbilical hernia.

Aortic Atherosclerosis ([id number].J).

## 2020-06-12 DIAGNOSIS — R531 Weakness: Secondary | ICD-10-CM | POA: Diagnosis not present

## 2020-06-12 DIAGNOSIS — U071 COVID-19: Secondary | ICD-10-CM | POA: Diagnosis not present

## 2020-06-12 DIAGNOSIS — J9601 Acute respiratory failure with hypoxia: Secondary | ICD-10-CM | POA: Diagnosis not present

## 2020-06-14 ENCOUNTER — Other Ambulatory Visit: Payer: Self-pay

## 2020-06-14 ENCOUNTER — Ambulatory Visit (INDEPENDENT_AMBULATORY_CARE_PROVIDER_SITE_OTHER): Payer: Medicare Other | Admitting: Family Medicine

## 2020-06-14 ENCOUNTER — Encounter: Payer: Self-pay | Admitting: Family Medicine

## 2020-06-14 VITALS — BP 114/60 | HR 82 | Temp 98.4°F | Resp 16 | Ht 64.0 in | Wt 161.2 lb

## 2020-06-14 DIAGNOSIS — E538 Deficiency of other specified B group vitamins: Secondary | ICD-10-CM | POA: Diagnosis not present

## 2020-06-14 DIAGNOSIS — E1129 Type 2 diabetes mellitus with other diabetic kidney complication: Secondary | ICD-10-CM | POA: Diagnosis not present

## 2020-06-14 DIAGNOSIS — J411 Mucopurulent chronic bronchitis: Secondary | ICD-10-CM

## 2020-06-14 DIAGNOSIS — J8489 Other specified interstitial pulmonary diseases: Secondary | ICD-10-CM | POA: Diagnosis not present

## 2020-06-14 DIAGNOSIS — E785 Hyperlipidemia, unspecified: Secondary | ICD-10-CM

## 2020-06-14 DIAGNOSIS — G9519 Other vascular myelopathies: Secondary | ICD-10-CM

## 2020-06-14 DIAGNOSIS — R29818 Other symptoms and signs involving the nervous system: Secondary | ICD-10-CM

## 2020-06-14 DIAGNOSIS — G8929 Other chronic pain: Secondary | ICD-10-CM

## 2020-06-14 DIAGNOSIS — M545 Low back pain, unspecified: Secondary | ICD-10-CM | POA: Diagnosis not present

## 2020-06-14 DIAGNOSIS — I152 Hypertension secondary to endocrine disorders: Secondary | ICD-10-CM

## 2020-06-14 DIAGNOSIS — G2581 Restless legs syndrome: Secondary | ICD-10-CM | POA: Diagnosis not present

## 2020-06-14 DIAGNOSIS — E1142 Type 2 diabetes mellitus with diabetic polyneuropathy: Secondary | ICD-10-CM | POA: Diagnosis not present

## 2020-06-14 DIAGNOSIS — E1159 Type 2 diabetes mellitus with other circulatory complications: Secondary | ICD-10-CM | POA: Diagnosis not present

## 2020-06-14 DIAGNOSIS — I700 Atherosclerosis of aorta: Secondary | ICD-10-CM

## 2020-06-14 DIAGNOSIS — R809 Proteinuria, unspecified: Secondary | ICD-10-CM

## 2020-06-14 DIAGNOSIS — E559 Vitamin D deficiency, unspecified: Secondary | ICD-10-CM

## 2020-06-14 LAB — POCT GLYCOSYLATED HEMOGLOBIN (HGB A1C): Hemoglobin A1C: 6.2 % — AB (ref 4.0–5.6)

## 2020-06-14 MED ORDER — METFORMIN HCL ER 750 MG PO TB24: 750.0000 mg | ORAL_TABLET | Freq: Every day | ORAL | 1 refills | Status: DC

## 2020-06-14 MED ORDER — B-12 1000 MCG SL SUBL: 1.0000 | SUBLINGUAL_TABLET | Freq: Every day | SUBLINGUAL | 1 refills | Status: DC

## 2020-06-14 MED ORDER — EMPAGLIFLOZIN 25 MG PO TABS: 25.0000 mg | ORAL_TABLET | Freq: Every day | ORAL | 1 refills | Status: DC

## 2020-06-14 MED ORDER — ALBUTEROL SULFATE HFA 108 (90 BASE) MCG/ACT IN AERS: 2.0000 | INHALATION_SPRAY | Freq: Four times a day (QID) | RESPIRATORY_TRACT | 0 refills | Status: DC | PRN

## 2020-06-14 MED ORDER — HYDROCODONE-ACETAMINOPHEN 5-325 MG PO TABS: 2.0000 | ORAL_TABLET | Freq: Two times a day (BID) | ORAL | 0 refills | Status: DC | PRN

## 2020-06-14 MED ORDER — VITAMIN D 50 MCG (2000 UT) PO CAPS
1.0000 | ORAL_CAPSULE | Freq: Every day | ORAL | 1 refills | Status: DC
Start: 2020-06-14 — End: 2021-09-27

## 2020-06-14 MED ORDER — ATORVASTATIN CALCIUM 40 MG PO TABS: 40.0000 mg | ORAL_TABLET | Freq: Every day | ORAL | 1 refills | Status: DC

## 2020-06-14 NOTE — Progress Notes (Signed)
Name: Julie Wall   MRN: [id number]    DOB: 1955/12/19   Date:06/14/2020       Progress Note  Subjective  Chief Complaint  Chief Complaint  Patient presents with  . Diabetes  . Hypertension    HPI  Diabetes follow up: she was seen in April and A1C was up at 7.1%, today is 7.3%, today is down to 6.2%, she states glucose at home 95-low 100's She has been avoiding sweets , changed to Zero calories sodas. . She denies polyphagia, polydipsia or polyuria. Eye exam is up to date - we will get a copy for [facility], noticed decrease in vision of left eye, has a white film, she is using eye drops but not sure what the cause it  She also has associated obesity, dyslipidemia, HTN and microalbuminuria. She is on Jardiance, Taking metformin two in am  Eczema: she sees dermatologist at [facility], last visit was with Dr. Dub Mikes- [place]. She is taking Methotrexate 15 mg weekly . Eczema is good, has only a couple of areas on her hand today   HTN: she has associated dyslipidemia and DM, she has been taking half dose of Losartan because bp is a little low, but we need for kidney protection, she states getting dizzy occasionally - advised to take losartan half pill at night and monitor symptoms.   Hyperlipidemia:she is backAtorvastatinand aspirin daily,last LDL was at goal at 32 , HDL was low, she does not like nuts, but has been eating fish twice a week   COPD/Intesticial lung disease on CT chest : she quit smoking 12/03/2016 and again in 2021 , but resumed smokingFall 2019l,she quit again when she developed COVID-19 . She is taking Anoro. She states since pneumonia COVID-19 she has recovered well, finished medications and is no longer using oxygen. She has a follow up with pulmonologist to discuss CT scan that showed interstitial lung disease   Back pain: she has back pain a few times a week, pain now3/10 at rest, but goes up to8/10 when walking, and takes hydrocodone prn, she states  sometimes right leg gives out,and has radiculitis intermittent on left lower leg/ thigh area, she also has right calf pain now - not when squeezing it, hse states it is a weird pain - unable to describe it. She denies leg weakness, no bowel or bladder incontinence, she has numbness on left lateral thigh. Discussed risk of addiction and importance of no diversion.She is doing okay with 30 pills of hydrocodone to last at least 4  months.She has been seeing Dr. Sherryll Burger who ordered MRI and results below, she was advised to see neurosurgeon but she is scared of injections and or surgery. Unchanged   IMPRESSION/MRI thoracic spine 09/04/2019  Diffuse degenerative disc narrowing and bulging with small protrusions. Dominant findings at T11-12 where there is cord impingement with flattening and mild T2 hyperintensity by prior lumbar MRI.  IMPRESSION:08/03/2019  1. Moderate spinal stenosis L3-4 unchanged. Moderate subarticular stenosis bilaterally. 2. Severe spinal stenosis with progression since the prior study. Severe subarticular and foraminal stenosis on the left. 3. Moderate subarticular stenosis bilaterally L5-S1. 4. Progressive degenerative changes at T11-12 with significant spinal stenosis and mild cord signal abnormality. Thoracic MRI with dedicated axial images through this level may be helpful.  RLS: Shesees neurologist and is on higher dose of Requip , she was doing well but states having pain and movement again on right lower leg   Depression: she states she is feeling better, she was  very upset about her hair loss, she will discuss it with her dermatologist. She has a wig on and is okay now , does not want medication   Patient Active Problem List   Diagnosis Date Noted  . Acute respiratory failure with hypoxemia (HCC)   . Hypotension   . Diabetic polyneuropathy associated with type 2 diabetes mellitus (HCC)   . Weakness   . Pneumonia due to COVID-19 virus 02/05/2020   . Hilar lymphadenopathy 02/05/2020  . Morbid obesity (HCC) 06/26/2018  . Atherosclerosis of aorta (HCC) 05/04/2017  . Pap smear abnormality of cervix with ASCUS favoring benign 04/23/2017  . Vitamin D deficiency 12/14/2016  . Obesity (BMI 30.0-34.9) 12/13/2015  . Chronic bronchitis (HCC) 05/14/2015  . Diabetes mellitus (HCC) 05/14/2015  . Positive H. pylori test 04/05/2015  . Rapid urease test for Helicobacter pylori infection positive 04/05/2015  . Umbilical hernia 04/01/2015  . Exomphalos 04/01/2015  . RLS (restless legs syndrome) 01/08/2015  . Type 2 diabetes mellitus with hyperlipidemia (HCC) 01/08/2015  . Neurogenic claudication (HCC) 01/08/2015  . AD (atopic dermatitis) 12/28/2014  . Chronic LBP 12/28/2014  . Dyslipidemia 12/28/2014  . Gastroesophageal reflux disease without esophagitis 12/28/2014  . Microalbuminuria 12/28/2014  . Disorder of bone and cartilage 12/28/2014  . Neuralgia of left thigh 12/28/2014  . Perennial allergic rhinitis with seasonal variation 12/28/2014  . Tobacco abuse 12/28/2014  . Mononeuropathy of left lower extremity 12/28/2014  . Hypothyroidism 09/22/2005  . Hypertension, benign 09/22/2005    Past Surgical History:  Procedure Laterality Date  . CATARACT EXTRACTION Bilateral 1980  . CESAREAN SECTION    . CORNEAL TRANSPLANT Left    [facility]-Treatment for blindness.    Family History  Problem Relation Age of Onset  . Diabetes Mother   . Hypertension Mother   . Pancreatic cancer Mother   . Alcohol abuse Father   . Arthritis/Rheumatoid Sister   . Diabetes Sister   . Kidney disease Sister   . Diabetes Brother   . Breast cancer Neg Hx     Social History   Tobacco Use  . Smoking status: Former Smoker    Packs/day: 0.50    Years: 46.00    Pack years: 23.00    Types: Cigarettes    Start date: 01/07/1985    Quit date: 02/01/2020    Years since quitting: 0.3  . Smokeless tobacco: Never Used  Substance Use Topics  . Alcohol use: No     Alcohol/week: 0.0 standard drinks     Current Outpatient Medications:  .  Alcohol Swabs (ALCOHOL PREP) 70 % PADS, , Disp: , Rfl:  .  AquaLance Lancets 30G MISC, Apply topically., Disp: , Rfl:  .  aspirin 81 MG chewable tablet, Chew 1 tablet by mouth daily., Disp: , Rfl:  .  Baclofen 5 MG TABS, Take 1 tablet by mouth 2 (two) times daily as needed., Disp: , Rfl:  .  Cholecalciferol (VITAMIN D) 50 MCG (2000 UT) CAPS, Take 1 capsule (2,000 Units total) by mouth daily., Disp: 100 capsule, Rfl: 1 .  Cyanocobalamin (B-12) 1000 MCG SUBL, Place 1 tablet under the tongue daily., Disp: 100 tablet, Rfl: 1 .  dorzolamide-timolol (COSOPT) 22.3-6.8 MG/ML ophthalmic solution, Place 1 drop into both eyes 2 (two) times daily. , Disp: , Rfl:  .  erythromycin ophthalmic ointment, SMARTSIG:In Eye(s), Disp: , Rfl:  .  folic acid (FOLVITE) 1 MG tablet, Take 1 mg by mouth daily. , Disp: , Rfl:  .  gabapentin (NEURONTIN) 100 MG capsule,  Take 200 mg by mouth 3 (three) times daily., Disp: , Rfl:  .  glucose blood test strip, Check blood sugar once daily fasting, and up to 4 times daily PRN (Patient taking differently: Check blood sugar once daily fasting, and up to 4 times daily PRN ACCU CHEK smartview), Disp: 100 each, Rfl: 2 .  Lancet Devices (SIMPLE DIAGNOSTICS LANCING DEV) MISC, Check blood sugar once daily fasting, and up to 4 times daily PRN., Disp: 100 each, Rfl: 2 .  losartan (COZAAR) 25 MG tablet, Take 0.5 tablets (12.5 mg total) by mouth daily. In place of lisinopril, Disp: 45 tablet, Rfl: 1 .  methotrexate (RHEUMATREX) 2.5 MG tablet, Take 15 mg by mouth every Friday. , Disp: , Rfl:  .  POLYTRIM ophthalmic solution, Place into the left eye., Disp: , Rfl:  .  rOPINIRole (REQUIP XL) 4 MG 24 hr tablet, Take 4 mg by mouth at bedtime. , Disp: , Rfl:  .  triamcinolone ointment (KENALOG) 0.1 %, Apply topically 2 (two) times daily., Disp: 30 g, Rfl: 0 .  umeclidinium-vilanterol (ANORO ELLIPTA) 62.5-25 MCG/INH AEPB,  Inhale 1 puff into the lungs daily., Disp: 180 each, Rfl: 2 .  albuterol (VENTOLIN HFA) 108 (90 Base) MCG/ACT inhaler, Inhale 2 puffs into the lungs every 6 (six) hours as needed for wheezing or shortness of breath., Disp: 18 g, Rfl: 0 .  atorvastatin (LIPITOR) 40 MG tablet, Take 1 tablet (40 mg total) by mouth daily. In place of simvastatin, Disp: 90 tablet, Rfl: 1 .  empagliflozin (JARDIANCE) 25 MG TABS tablet, Take 1 tablet (25 mg total) by mouth daily., Disp: 90 tablet, Rfl: 1 .  HYDROcodone-acetaminophen (NORCO/VICODIN) 5-325 MG tablet, Take 2 tablets by mouth 2 (two) times daily as needed for moderate pain., Disp: 30 tablet, Rfl: 0 .  metFORMIN (GLUCOPHAGE-XR) 750 MG 24 hr tablet, Take 1 tablet (750 mg total) by mouth daily with breakfast., Disp: 90 tablet, Rfl: 1 .  VIGAMOX 0.5 % ophthalmic solution, , Disp: , Rfl:   Allergies  Allergen Reactions  . Penicillins Rash    I personally reviewed active problem list, medication list, allergies, family history, social history, health maintenance with the patient/caregiver today.   ROS  Constitutional: Negative for fever or weight change.  Respiratory: Negative for cough and shortness of breath.   Cardiovascular: Negative for chest pain or palpitations.  Gastrointestinal: Negative for abdominal pain, no bowel changes.  Musculoskeletal: Negative for gait problem or joint swelling.  Skin: Negative for rash.  Neurological: Negative for dizziness or headache.  No other specific complaints in a complete review of systems (except as listed in HPI above).  Objective  Vitals:   06/14/20 1006  BP: 114/60  Pulse: 82  Resp: 16  Temp: 98.4 F (36.9 C)  TempSrc: Oral  SpO2: 95%  Weight: 161 lb 3.2 oz (73.1 kg)  Height: 5\' 4"  (1.626 m)    Body mass index is 27.67 kg/m.  Physical Exam  Constitutional: Patient appears well-developed and well-nourished. Obese  No distress.  HEENT: head atraumatic, normocephalic, pupils equal and  reactive to light,  neck supple Cardiovascular: Normal rate, regular rhythm and normal heart sounds.  No murmur heard. No BLE edema. Pulmonary/Chest: Effort normal and breath sounds normal. No respiratory distress. Abdominal: Soft.  There is no tenderness. Skin: some hypopigmentation, eczematous patches on hands  Psychiatric: Patient has a normal mood and affect. behavior is normal. Judgment and thought content normal.  Recent Results (from the past 2160 hour(s))  POCT  HgB A1C     Status: Abnormal   Collection Time: 06/14/20 10:13 AM  Result Value Ref Range   Hemoglobin A1C 6.2 (A) 4.0 - 5.6 %   HbA1c POC (<> result, manual entry)     HbA1c, POC (prediabetic range)     HbA1c, POC (controlled diabetic range)      PHQ2/9: Depression screen [facility] 2/9 06/14/2020 06/14/2020 04/27/2020 03/02/2020 02/26/2020  Decreased Interest 0 0 0 0 0  Down, Depressed, Hopeless 0 0 0 0 0  PHQ - 2 Score 0 0 0 0 0  Altered sleeping 1 - - - -  Tired, decreased energy 2 - - - -  Change in appetite 0 - - - -  Feeling bad or failure about yourself  0 - - - -  Trouble concentrating 0 - - - -  Moving slowly or fidgety/restless 0 - - - -  Suicidal thoughts 0 - - - -  PHQ-9 Score 3 - - - -  Difficult doing work/chores Not difficult at all - - - -  Some recent data might be hidden    phq 9 is negative    Fall Risk: Fall Risk  06/14/2020 04/27/2020 02/26/2020 02/04/2020 11/05/2019  Falls in the past year? 0 0 0 0 0  Number falls in past yr: 0 0 0 0 0  Injury with Fall? 0 0 0 0 0  Comment - - - - -  Risk Factor Category  - - - - -  Risk for fall due to : - - - - -  Risk for fall due to: Comment - - - - -  Follow up - Falls evaluation completed - - Falls evaluation completed     Functional Status Survey: Is the patient deaf or have difficulty hearing?: No Does the patient have difficulty seeing, even when wearing glasses/contacts?: No Does the patient have difficulty concentrating, remembering, or making  decisions?: No Does the patient have difficulty walking or climbing stairs?: No Does the patient have difficulty dressing or bathing?: No Does the patient have difficulty doing errands alone such as visiting a doctor's office or shopping?: No    Assessment & Plan  1. Type 2 diabetes mellitus with other diabetic kidney complication (HCC)  - POCT HgB A1C  2. B12 deficiency   3. DM type 2 with diabetic peripheral neuropathy (HCC)  - metFORMIN (GLUCOPHAGE-XR) 750 MG 24 hr tablet; Take 1 tablet (750 mg total) by mouth daily with breakfast.  Dispense: 90 tablet; Refill: 1 - empagliflozin (JARDIANCE) 25 MG TABS tablet; Take 1 tablet (25 mg total) by mouth daily.  Dispense: 90 tablet; Refill: 1  4. Mucopurulent chronic bronchitis (HCC)  - albuterol (VENTOLIN HFA) 108 (90 Base) MCG/ACT inhaler; Inhale 2 puffs into the lungs every 6 (six) hours as needed for wheezing or shortness of breath.  Dispense: 18 g; Refill: 0  5. Interstitial pneumonitis (HCC)   6. Vitamin D deficiency   7. Chronic bilateral low back pain without sciatica  - HYDROcodone-acetaminophen (NORCO/VICODIN) 5-325 MG tablet; Take 2 tablets by mouth 2 (two) times daily as needed for moderate pain.  Dispense: 30 tablet; Refill: 0  8. Atherosclerosis of aorta (HCC)  On statin therapy   9. RLS (restless legs syndrome)   10. Diabetes mellitus with microalbuminuria (HCC)   11. Neurogenic claudication (HCC)  - HYDROcodone-acetaminophen (NORCO/VICODIN) 5-325 MG tablet; Take 2 tablets by mouth 2 (two) times daily as needed for moderate pain.  Dispense: 30 tablet; Refill: 0  12. Dyslipidemia  - atorvastatin (LIPITOR) 40 MG tablet; Take 1 tablet (40 mg total) by mouth daily. In place of simvastatin  Dispense: 90 tablet; Refill: 1  13. Hypertension associated with diabetes (HCC)  Towards low end end of normal, continue medication  14. Type 2 diabetes mellitus with microalbuminuria, without [place]-term current use of  insulin (HCC)  - metFORMIN (GLUCOPHAGE-XR) 750 MG 24 hr tablet; Take 1 tablet (750 mg total) by mouth daily with breakfast.  Dispense: 90 tablet; Refill: 1 - empagliflozin (JARDIANCE) 25 MG TABS tablet; Take 1 tablet (25 mg total) by mouth daily.  Dispense: 90 tablet; Refill: 1

## 2020-06-15 ENCOUNTER — Ambulatory Visit: Payer: Medicare Other

## 2020-06-24 ENCOUNTER — Other Ambulatory Visit: Payer: Self-pay

## 2020-06-24 ENCOUNTER — Ambulatory Visit (INDEPENDENT_AMBULATORY_CARE_PROVIDER_SITE_OTHER): Payer: Medicare Other | Admitting: Pulmonary Disease

## 2020-06-24 ENCOUNTER — Encounter: Payer: Self-pay | Admitting: Pulmonary Disease

## 2020-06-24 VITALS — BP 112/68 | HR 80 | Temp 97.3°F | Ht 64.0 in | Wt 162.8 lb

## 2020-06-24 DIAGNOSIS — R0683 Snoring: Secondary | ICD-10-CM

## 2020-06-24 DIAGNOSIS — R918 Other nonspecific abnormal finding of lung field: Secondary | ICD-10-CM

## 2020-06-24 DIAGNOSIS — J449 Chronic obstructive pulmonary disease, unspecified: Secondary | ICD-10-CM | POA: Diagnosis not present

## 2020-06-24 DIAGNOSIS — Z87891 Personal history of nicotine dependence: Secondary | ICD-10-CM | POA: Diagnosis not present

## 2020-06-24 NOTE — Patient Instructions (Signed)
We are going to get breathing tests.   You did the best thing for your lungs by quitting smoking.   Continue Anoro (inhaler with red top).   Your CT scan of the chest has had similar findings since at least as far back as 2007.   We are going to arrange for home sleep study.   We will see him in follow-up in 3 months time call sooner should any new problems arise.

## 2020-06-24 NOTE — Progress Notes (Signed)
Subjective:    Patient ID: Julie Wall, female    DOB: 04/27/1956, 65 y.o.   MRN: [id number]  HPI This is a 65 year old former smoker (02/01/2020, 46-pack-year history) who presents for evaluation of potential COPD.  She is kindly referred by Alba Cory, MD.  She also has been told she has "a spot" in her lung from lung cancer screening CT.  The patient currently is on Anoro Ellipta.  Does not note any symptoms of dyspnea.  No cough or sputum production.  She had COVID-19 in September 2021 however did well from that perspective.  She has not noticed any sequela from that infection.  Most recent lung cancer screening CT was performed on December 14.  I have reviewed those films and have reviewed as far back as 2007.  There are no significant changes.  I reviewed the films with the patient.  She has not had any weight loss or anorexia.  No fevers, chills or sweats.  No cough or sputum production.  No hemoptysis.  Does not endorse any orthopnea or paroxysmal nocturnal dyspnea.  She does get occasional lower extremity edema.  She does have occasional gastroesophageal reflux symptoms but does not require chronic medications for this.  The patient is on methotrexate for severe atopic dermatitis.  She has not noticed any symptomatology taking this medication.  She does note nonrestorative sleep and that she has been told she has loud snoring and apneic episodes during sleep.  She apparently had a prior sleep study recall when or where.  No formal PFTs.  A spirometry was performed on 22 July 2018 which was normal.  Review of Systems A 10 point review of systems was performed and it is as noted above otherwise negative.  Past Medical History:  Diagnosis Date  . Allergy   . ASCUS favor benign 09/2013   negative HPV  . Atopic dermatitis    Dermatologist at [facility]  . Chronic low back pain   . COPD (chronic obstructive pulmonary disease) (HCC)   . Decreased exercise tolerance   . Dental caries   .  Diabetes mellitus without complication (HCC)   . GERD (gastroesophageal reflux disease)   . Hyperlipidemia   . Hypertension   . Lumbosacral neuritis   . Microalbuminuria   . Osteopenia   . Ovarian failure   . Tobacco use    Past Surgical History:  Procedure Laterality Date  . CATARACT EXTRACTION Bilateral 1980  . CESAREAN SECTION    . CORNEAL TRANSPLANT Left    [facility]-Treatment for blindness.   Family History  Problem Relation Age of Onset  . Diabetes Mother   . Hypertension Mother   . Pancreatic cancer Mother   . Alcohol abuse Father   . Arthritis/Rheumatoid Sister   . Diabetes Sister   . Kidney disease Sister   . Diabetes Brother   . Breast cancer Neg Hx    Social History   Tobacco Use  . Smoking status: Former Smoker    Packs/day: 1.00    Years: 46.00    Pack years: 46.00    Types: Cigarettes    Start date: 01/07/1985    Quit date: 02/01/2020    Years since quitting: 0.3  . Smokeless tobacco: Never Used  Substance Use Topics  . Alcohol use: No    Alcohol/week: 0.0 standard drinks   Allergies  Allergen Reactions  . Penicillins Rash   Current Meds  Medication Sig  . albuterol (VENTOLIN HFA) 108 (90 Base) MCG/ACT inhaler Inhale  2 puffs into the lungs every 6 (six) hours as needed for wheezing or shortness of breath.  . Alcohol Swabs (ALCOHOL PREP) 70 % PADS   . AquaLance Lancets 30G MISC Apply topically.  Marland Kitchen aspirin 81 MG chewable tablet Chew 1 tablet by mouth daily.  Marland Kitchen atorvastatin (LIPITOR) 40 MG tablet Take 1 tablet (40 mg total) by mouth daily. In place of simvastatin  . Baclofen 5 MG TABS Take 1 tablet by mouth 2 (two) times daily as needed.  . Cholecalciferol (VITAMIN D) 50 MCG (2000 UT) CAPS Take 1 capsule (2,000 Units total) by mouth daily.  . Cyanocobalamin (B-12) 1000 MCG SUBL Place 1 tablet under the tongue daily.  . dorzolamide-timolol (COSOPT) 22.3-6.8 MG/ML ophthalmic solution Place 1 drop into both eyes 2 (two) times daily.   . empagliflozin  (JARDIANCE) 25 MG TABS tablet Take 1 tablet (25 mg total) by mouth daily.  Marland Kitchen erythromycin ophthalmic ointment SMARTSIG:In Eye(s)  . folic acid (FOLVITE) 1 MG tablet Take 1 mg by mouth daily.   Marland Kitchen gabapentin (NEURONTIN) 100 MG capsule Take 200 mg by mouth 3 (three) times daily.  Marland Kitchen glucose blood test strip Check blood sugar once daily fasting, and up to 4 times daily PRN (Patient taking differently: Check blood sugar once daily fasting, and up to 4 times daily PRN ACCU CHEK smartview)  . HYDROcodone-acetaminophen (NORCO/VICODIN) 5-325 MG tablet Take 2 tablets by mouth 2 (two) times daily as needed for moderate pain.  Demetra Shiner Devices (SIMPLE DIAGNOSTICS LANCING DEV) MISC Check blood sugar once daily fasting, and up to 4 times daily PRN.  Marland Kitchen losartan (COZAAR) 25 MG tablet Take 0.5 tablets (12.5 mg total) by mouth daily. In place of lisinopril  . metFORMIN (GLUCOPHAGE-XR) 750 MG 24 hr tablet Take 1 tablet (750 mg total) by mouth daily with breakfast.  . methotrexate (RHEUMATREX) 2.5 MG tablet Take 15 mg by mouth every Friday.   Marland Kitchen POLYTRIM ophthalmic solution Place into the left eye.  Marland Kitchen rOPINIRole (REQUIP XL) 4 MG 24 hr tablet Take 4 mg by mouth at bedtime.   . triamcinolone ointment (KENALOG) 0.1 % Apply topically 2 (two) times daily.  Marland Kitchen umeclidinium-vilanterol (ANORO ELLIPTA) 62.5-25 MCG/INH AEPB Inhale 1 puff into the lungs daily.  Marland Kitchen VIGAMOX 0.5 % ophthalmic solution    Immunization History  Administered Date(s) Administered  . Fluad Quad(high Dose 65+) 03/02/2020  . Influenza Split 04/09/2012  . Influenza, Seasonal, Injecte, Preservative Fre 02/17/2011  . Influenza,inj,Quad PF,6+ Mos 07/16/2013, 03/23/2014, 04/01/2015, 03/14/2016, 03/16/2017, 02/19/2018, 05/07/2019  . Influenza-Unspecified 07/16/2013, 03/23/2014, 04/01/2015, 03/14/2016, 03/16/2017, 02/19/2018, 05/07/2019  . Pneumococcal Conjugate-13 04/01/2015  . Pneumococcal Polysaccharide-23 01/26/2010, 09/05/2019  . Tdap 01/25/2012  .  Zoster 01/08/2015       Objective:   Physical Exam BP 112/68 (BP Location: Left Arm, Cuff Size: Normal)   Pulse 80   Temp (!) 97.3 F (36.3 C) (Temporal)   Ht 5\' 4"  (1.626 m)   Wt 162 lb 12.8 oz (73.8 kg)   SpO2 96%   BMI 27.94 kg/m  GENERAL: Overweight woman in no acute distress.  Fully ambulatory. HEAD: Normocephalic, atraumatic.  EYES: Pupils equal, round, reactive to light.  No scleral icterus.  Left corneal opacity (transplant). MOUTH: And requirement NECK: Supple. No thyromegaly. Trachea midline. No JVD.  No adenopathy. PULMONARY: Good air entry bilaterally.  Coarse otherwise, no adventitious sounds. CARDIOVASCULAR: S1 and S2. Regular rate and rhythm.  No rubs, murmurs or gallops heard. ABDOMEN: Obese, benign. MUSCULOSKELETAL: No joint deformity, mild  clubbing of fingers, no edema.  NEUROLOGIC: No focal deficit, no gait disturbance.  Speech is fluent. SKIN: Intact,warm,dry.  On limited exam no rashes PSYCH: Mood and behavior normal.   Representative slice of lung cancer screening CT performed 05/18/2020, no major abnormalities.  No change from prior.  Minute lung nodules with no suspicious nodules noted.  Imaging reviewed with patient:       Assessment & Plan:     ICD-10-CM   1. COPD suggested by initial evaluation [facility])  J44.9 Pulmonary Function Test [facility] Only   PFTs Continue Anoro Follow-up in 3 months time call sooner should any new problems arise  2. Loud snoring  R06.83 Home sleep test   Home sleep study  3. Multiple lung nodules on CT  R91.8    Present since at least 2007 Less than 6 mm in diameter Continue to follow  4. Former smoker  Z87.891    Patient commended on discontinuation of smoking   Orders Placed This Encounter  Procedures  . Pulmonary Function Test [facility] Only    Standing Status:   Future    Standing Expiration Date:   06/24/2021    Scheduling Instructions:     Next available.  . Home sleep test    Standing Status:   Future    Number  of Occurrences:   1    Standing Expiration Date:   06/24/2021    Order Specific Question:   Where should this test be performed:    Answer:   [facility] - Pulmonary   Discussion:  Patient likely has mild COPD.  She seems to be getting good results from use of Anoro which was prescribed by her primary care physician.  Continue the same.  Will obtain formal PFTs to further delve into this issue.  She does have a history of loud snoring with apneic episodes witnessed by others.  We will obtain home sleep study as she is at high risk for sleep apnea.  She has had multiple minute lung nodules noted on CT that have been present since at least 2007 less than 6 mm in diameter and with no change.  Continue yearly lung cancer screening.  She was commended on discontinuation of smoking.  We will see the patient in follow-up and 3 months time she is to contact us prior to that time should any new problems arise.   Gailen Shelter, MD [place] [facility]   *This note was dictated using voice recognition software/Dragon.  Despite best efforts to proofread, errors can occur which can change the meaning.  Any change was purely unintentional.

## 2020-07-01 ENCOUNTER — Ambulatory Visit (INDEPENDENT_AMBULATORY_CARE_PROVIDER_SITE_OTHER): Payer: Medicare Other

## 2020-07-01 DIAGNOSIS — Z0000 Encounter for general adult medical examination without abnormal findings: Secondary | ICD-10-CM | POA: Diagnosis not present

## 2020-07-01 DIAGNOSIS — Z780 Asymptomatic menopausal state: Secondary | ICD-10-CM | POA: Diagnosis not present

## 2020-07-01 DIAGNOSIS — Z1231 Encounter for screening mammogram for malignant neoplasm of breast: Secondary | ICD-10-CM | POA: Diagnosis not present

## 2020-07-01 NOTE — Progress Notes (Signed)
Subjective:   Julie Wall is a 65 y.o. female who presents for Medicare Annual (Subsequent) preventive examination.  Virtual Visit via Telephone Note  I connected with  Julie Wall on 07/02/20 at  3:30 PM EST by telephone and verified that I am speaking with the correct person using two identifiers.  Location: Patient: home Provider: [place] Persons participating in the virtual visit: [place]   I discussed the limitations, risks, security and privacy concerns of performing an evaluation and management service by telephone and the availability of in person appointments. The patient expressed understanding and agreed to proceed.  Interactive audio and video telecommunications were attempted between this nurse and patient, however failed, due to patient having technical difficulties OR patient did not have access to video capability.  We continued and completed visit with audio only.  Some vital signs may be absent or patient reported.   [name], LPN    Review of Systems           Objective:    There were no vitals filed for this visit. There is no height or weight on file to calculate BMI.  Advanced Directives 07/01/2020 06/12/2019 05/23/2018 03/16/2017 12/05/2016 06/14/2016 03/14/2016  Does Patient Have a Medical Advance Directive? No No No No No No No  Would patient like information on creating a medical advance directive? No - Patient declined No - Patient declined No - Patient declined - - - No - patient declined information    Current Medications (verified) Outpatient Encounter Medications as of 07/01/2020  Medication Sig  . albuterol (VENTOLIN HFA) 108 (90 Base) MCG/ACT inhaler Inhale 2 puffs into the lungs every 6 (six) hours as needed for wheezing or shortness of breath.  . Alcohol Swabs (ALCOHOL PREP) 70 % PADS   . AquaLance Lancets 30G MISC Apply topically.  Marland Kitchen aspirin 81 MG chewable tablet Chew 1 tablet by mouth daily.  Marland Kitchen atorvastatin (LIPITOR)  40 MG tablet Take 1 tablet (40 mg total) by mouth daily. In place of simvastatin  . Baclofen 5 MG TABS Take 1 tablet by mouth 2 (two) times daily as needed.  . Cholecalciferol (VITAMIN D) 50 MCG (2000 UT) CAPS Take 1 capsule (2,000 Units total) by mouth daily.  . Cyanocobalamin (B-12) 1000 MCG SUBL Place 1 tablet under the tongue daily.  . dorzolamide-timolol (COSOPT) 22.3-6.8 MG/ML ophthalmic solution Place 1 drop into both eyes 2 (two) times daily.   . empagliflozin (JARDIANCE) 25 MG TABS tablet Take 1 tablet (25 mg total) by mouth daily.  . folic acid (FOLVITE) 1 MG tablet Take 1 mg by mouth daily.   Marland Kitchen gabapentin (NEURONTIN) 100 MG capsule Take 200 mg by mouth 3 (three) times daily.  Marland Kitchen glucose blood test strip Check blood sugar once daily fasting, and up to 4 times daily PRN (Patient taking differently: Check blood sugar once daily fasting, and up to 4 times daily PRN ACCU CHEK smartview)  . HYDROcodone-acetaminophen (NORCO/VICODIN) 5-325 MG tablet Take 2 tablets by mouth 2 (two) times daily as needed for moderate pain.  Elmore Guise Devices (SIMPLE DIAGNOSTICS LANCING DEV) MISC Check blood sugar once daily fasting, and up to 4 times daily PRN.  Marland Kitchen losartan (COZAAR) 25 MG tablet Take 0.5 tablets (12.5 mg total) by mouth daily. In place of lisinopril  . metFORMIN (GLUCOPHAGE-XR) 750 MG 24 hr tablet Take 1 tablet (750 mg total) by mouth daily with breakfast.  . methotrexate (RHEUMATREX) 2.5 MG tablet Take 15 mg by mouth every Friday.   Marland Kitchen  POLYTRIM ophthalmic solution Place into the left eye.  Marland Kitchen rOPINIRole (REQUIP XL) 4 MG 24 hr tablet Take 4 mg by mouth at bedtime.   . triamcinolone ointment (KENALOG) 0.1 % Apply topically 2 (two) times daily.  Marland Kitchen umeclidinium-vilanterol (ANORO ELLIPTA) 62.5-25 MCG/INH AEPB Inhale 1 puff into the lungs daily.  Marland Kitchen VIGAMOX 0.5 % ophthalmic solution   . [DISCONTINUED] erythromycin ophthalmic ointment SMARTSIG:In Eye(s)   No facility-administered encounter medications on  file as of 07/01/2020.    Allergies (verified) Penicillins   History: Past Medical History:  Diagnosis Date  . Allergy   . ASCUS favor benign 09/2013   negative HPV  . Atopic dermatitis    Dermatologist at [facility]  . Chronic low back pain   . COPD (chronic obstructive pulmonary disease) ([place])   . Decreased exercise tolerance   . Dental caries   . Diabetes mellitus without complication ([place])   . GERD (gastroesophageal reflux disease)   . Hyperlipidemia   . Hypertension   . Lumbosacral neuritis   . Microalbuminuria   . Osteopenia   . Ovarian failure   . Tobacco use    Past Surgical History:  Procedure Laterality Date  . CATARACT EXTRACTION Bilateral 1980  . CESAREAN SECTION    . CORNEAL TRANSPLANT Left    [facility]-Treatment for blindness.   Family History  Problem Relation Age of Onset  . Diabetes Mother   . Hypertension Mother   . Pancreatic cancer Mother   . Alcohol abuse Father   . Arthritis/Rheumatoid Sister   . Diabetes Sister   . Kidney disease Sister   . Diabetes Brother   . Breast cancer Neg Hx    Social History   Socioeconomic History  . Marital status: Single    Spouse name: Not on file  . Number of children: 3  . Years of education: Not on file  . Highest education level: High school graduate  Occupational History  . Not on file  Tobacco Use  . Smoking status: Former Smoker    Packs/day: 1.00    Years: 46.00    Pack years: 46.00    Types: Cigarettes    Start date: 01/07/1985    Quit date: 02/01/2020    Years since quitting: 0.4  . Smokeless tobacco: Never Used  Vaping Use  . Vaping Use: Never used  Substance and Sexual Activity  . Alcohol use: No    Alcohol/week: 0.0 standard drinks  . Drug use: No  . Sexual activity: Not Currently  Other Topics Concern  . Not on file  Social History Narrative   Disabled from severe atopic dermatitis   Has 3 children   Lives alone but very connect with family ( sees sister and her children daily)   Also  belongs to a church   Social Determinants of Health   Financial Resource Strain: Low Risk   . Difficulty of Paying Living Expenses: Not very hard  Food Insecurity: No Food Insecurity  . Worried About Charity fundraiser in the Last Year: Never true  . Ran Out of Food in the Last Year: Never true  Transportation Needs: No Transportation Needs  . Lack of Transportation (Medical): No  . Lack of Transportation (Non-Medical): No  Physical Activity: Insufficiently Active  . Days of Exercise per Week: 7 days  . Minutes of Exercise per Session: 20 min  Stress: No Stress Concern Present  . Feeling of Stress : Not at all  Social Connections: Moderately Integrated  . Frequency  of Communication with Friends and Family: More than three times a week  . Frequency of Social Gatherings with Friends and Family: More than three times a week  . Attends Religious Services: More than 4 times per year  . Active Member of Clubs or Organizations: Yes  . Attends Archivist Meetings: More than 4 times per year  . Marital Status: Never married    Tobacco Counseling Counseling given: Not Answered   Clinical Intake:  Pre-visit preparation completed: Yes  Pain : No/denies pain     Nutritional Risks: None Diabetes: Yes CBG done?: No CBG resulted in Enter/ Edit results?: No Did pt. bring in CBG monitor from home?: No  How often do you need to have someone help you when you read instructions, pamphlets, or other written materials from your doctor or pharmacy?: 1 - Never  Nutrition Risk Assessment:  Has the patient had any N/V/D within the last 2 months?  No  Does the patient have any non-healing wounds?  No  Has the patient had any unintentional weight loss or weight gain?  No   Diabetes:  Is the patient diabetic?  Yes  If diabetic, was a CBG obtained today?  No  Did the patient bring in their glucometer from home?  No  How often do you monitor your CBG's? daily.   Financial  Strains and Diabetes Management:  Are you having any financial strains with the device, your supplies or your medication? No .  Does the patient want to be seen by Chronic Care Management for management of their diabetes?  No  Would the patient like to be referred to a Nutritionist or for Diabetic Management?  No   Diabetic Exams:  Diabetic Eye Exam: Completed per patient Dr. Neville Route at [facility]; need records.    Diabetic Foot Exam: Completed 03/02/20.   Interpreter Needed?: No  Information entered by :: [name] LPN   Activities of Daily Living In your present state of health, do you have any difficulty performing the following activities: 06/14/2020 04/27/2020  Hearing? N N  Vision? N N  Difficulty concentrating or making decisions? N N  Walking or climbing stairs? N N  Dressing or bathing? N N  Doing errands, shopping? N N  Some recent data might be hidden    Patient Care Team: Steele Sizer, MD as PCP - General (Family Medicine) Ernesto Rutherford, MD as Referring Physician (Dermatology) Vladimir Crofts, MD as Consulting Physician (Neurology) Yolonda Kida, MD as Consulting Physician (Cardiology) Germaine Pomfret, [facility] as Pharmacist (Pharmacist)  Indicate any recent Medical Services you may have received from other than [facility] providers in the past year (date may be approximate).     Assessment:   This is a routine wellness examination for Brandyce.  Hearing/Vision screen  Hearing Screening   125Hz  250Hz  500Hz  1000Hz  2000Hz  3000Hz  4000Hz  6000Hz  8000Hz   Right ear:           Left ear:           Comments: Pt denies hearing difficulty  Vision Screening Comments: Annual vision screenings at [place] issues and exercise activities discussed:    Goals    . COPD - goal reduce rescue inhaler use      CARE PLAN ENTRY (see longitudinal plan of care for additional care plan information)  Current Barriers:  . Complex patient with  multiple comorbidities including tobacco use, Diabetes, back pain . Self-manages medications by bottles Does  not use a pill box or other adherence strategies   Pharmacist Clinical Goal(s):  Marland Kitchen Over the next 90 days, patient will work with PharmD and provider towards reduced rescue inhaler use to less than 1 time daily  Interventions: . Comprehensive medication review performed; medication list updated in electronic medical record . Inter-disciplinary care team collaboration (see longitudinal plan of care) . Smoking reduction and awareness of rebound reactive airway from abluterol  Patient Self Care Activities:  . Patient will take medications as prescribed . Patient will focus on improved adherence by using rescue inhaler only to avoid exacerbation . Reduce Ventolin use to 3 times weekly over the next 90 days   Initial goal documentation     . Diabetes Mellitus - goa A1c < 7%     CARE PLAN ENTRY (see longitudinal plan of care for additional care plan information)  Current Barriers:  . Diabetes: type 2; complicated by chronic medical conditions including Tobacco use, Chronic pain Lab Results  Component Value Date   HGBA1C 7.1 (A) 09/05/2019 .   Lab Results  Component Value Date   CREATININE 0.70 09/05/2019   CREATININE 0.70 10/21/2018   CREATININE 0.62 03/05/2018 .   Marland Kitchen No results found for: EGFR . Current antihyperglycemic regimen: metformin, Jardiance . Denies hypoglycemic symptoms, including dizziness, lightheadedness, shaking, sweating . Denies hyperglycemic symptoms, including polyuria, polydipsia, polyphagia, nocturia, blurred vision, neuropathy . Current meal patterns: o Snacks: eats cookies between meals . Current exercise: PT . Current blood glucose readings: doesn't check  Pharmacist Clinical Goal(s):  Marland Kitchen Over the next 90 days, patient will work with PharmD and primary care provider to address unnecessary snacking  Interventions: . Comprehensive medication  review performed, medication list updated in electronic medical record . Inter-disciplinary care team collaboration (see longitudinal plan of care) . Discussed role of diet in blood glucose control  Patient Self Care Activities:  . Patient to reduce snacking between meals, especially high carbohydrate snacks . Patient to replace physical therapy with home exercise over the next 90 days  Initial goal documentation     . Quit Smoking     Pt would like to stop smoking completely.    . Tobacco abuse - goal taper to cessation     CARE PLAN ENTRY (see longitudinal plan of care for additional care plan information)  Current Barriers:  . Tobacco abuse of 46 years; currently smoking 0.5 ppd, stopped after Covid-19 . Previous quit attempts, unsuccessful . Denies smoking within 30 minutes of waking up . Reports triggers to smoke include: family stress . Reports motivation to quit smoking includes: she is strong willed and confident . On a scale of 1-10, reports MOTIVATION to quit is 7 . On a scale of 1-10, reports CONFIDENCE in quitting is 10  Pharmacist Clinical Goal(s):  Marland Kitchen Over the next 30 days, patient will work with PharmD and provider towards continued tobacco cessation  Interventions: . Comprehensive medication review performed, medication list in electronic medical record updated . Inter-disciplinary care team collaboration (see longitudinal plan of care) . Has not smoked since Covid-19 hospital stay 02/05/20   Patient Self Care Activities:  . Call [id number] for post quitting support  Initial goal documentation       Depression Screen PHQ 2/9 Scores 07/01/2020 06/14/2020 06/14/2020 04/27/2020 03/02/2020 02/26/2020 02/04/2020  PHQ - 2 Score 0 0 0 0 0 0 0  PHQ- 9 Score - 3 - - - - -  Exception Documentation - - - - - - -  Not completed - - - - - - -    Fall Risk Fall Risk  07/01/2020 06/14/2020 04/27/2020 02/26/2020 02/04/2020  Falls in the past year? 0 0 0 0 0  Number falls in past  yr: 0 0 0 0 0  Injury with Fall? 0 0 0 0 0  Comment - - - - -  Risk Factor Category  - - - - -  Risk for fall due to : No Fall Risks - - - -  Risk for fall due to: Comment - - - - -  Follow up Falls prevention discussed - Falls evaluation completed - -    FALL RISK PREVENTION PERTAINING TO THE HOME:  Any stairs in or around the home? Yes  If so, are there any without handrails? No  Home free of loose throw rugs in walkways, pet beds, electrical cords, etc? Yes  Adequate lighting in your home to reduce risk of falls? Yes   ASSISTIVE DEVICES UTILIZED TO PREVENT FALLS:  Life alert? No  Use of a cane, walker or w/c? No  Grab bars in the bathroom? No  Shower chair or bench in shower? Yes  Elevated toilet seat or a handicapped toilet? No   TIMED UP AND GO:  Was the test performed? No . Telephonic visit.   Cognitive Function: Normal cognitive status assessed by direct observation by this Nurse Health Advisor. No abnormalities found.       6CIT Screen 06/12/2019 05/23/2018  What Year? 0 points 0 points  What month? 0 points 0 points  What time? 0 points 0 points  Count back from 20 0 points 0 points  Months in reverse 0 points 0 points  Repeat phrase 6 points 4 points  Total Score 6 4    Immunizations Immunization History  Administered Date(s) Administered  . Fluad Quad(high Dose 65+) 03/02/2020  . Influenza Split 04/09/2012  . Influenza, Seasonal, Injecte, Preservative Fre 02/17/2011  . Influenza,inj,Quad PF,6+ Mos 07/16/2013, 03/23/2014, 04/01/2015, 03/14/2016, 03/16/2017, 02/19/2018, 05/07/2019  . Influenza-Unspecified 07/16/2013, 03/23/2014, 04/01/2015, 03/14/2016, 03/16/2017, 02/19/2018, 05/07/2019  . Pneumococcal Conjugate-13 04/01/2015  . Pneumococcal Polysaccharide-23 01/26/2010, 09/05/2019  . Tdap 01/25/2012  . Zoster 01/08/2015    TDAP status: Up to date  Flu Vaccine status: Up to date  Pneumococcal vaccine status: Up to date  Covid-19 vaccine status:  Information provided on how to obtain vaccines.   Qualifies for Shingles Vaccine? Yes   Zostavax completed Yes   Shingrix Completed?: No.    Education has been provided regarding the importance of this vaccine. Patient has been advised to call insurance company to determine out of pocket expense if they have not yet received this vaccine. Advised may also receive vaccine at local pharmacy or Health Dept. Verbalized acceptance and understanding.  Screening Tests Health Maintenance  Topic Date Due  . OPHTHALMOLOGY EXAM  06/27/2019  . PAP SMEAR-Modifier  04/23/2020  . COVID-19 Vaccine (1) 07/10/2020 (Originally 08/07/1967)  . COLONOSCOPY (Pts 45-66yr Insurance coverage will need to be confirmed)  07/05/2020  . HEMOGLOBIN A1C  12/12/2020  . FOOT EXAM  03/02/2021  . MAMMOGRAM  09/16/2021  . TETANUS/TDAP  01/24/2022  . INFLUENZA VACCINE  Completed  . DEXA SCAN  Completed  . Hepatitis C Screening  Completed  . HIV Screening  Completed  . PNA vac Low Risk Adult  Completed    Health Maintenance  Health Maintenance Due  Topic Date Due  . OPHTHALMOLOGY EXAM  06/27/2019  . PAP SMEAR-Modifier  04/23/2020  Colorectal cancer screening: Type of screening: Colonoscopy. Completed 07/05/10. Repeat every 10 years. Pt declined repeat screening colonoscopy at this time.   Mammogram status: Completed 09/17/19. Repeat every year  Bone Density status: Ordered today. Pt provided with contact info and advised to call to schedule appt.  Lung Cancer Screening: (Low Dose CT Chest recommended if Age 66-80 years, 30 pack-year currently smoking OR have quit w/in 15years.) does qualify. Completed 05/17/20.  Additional Screening:  Hepatitis C Screening: does qualify; Completed 01/27/13  Vision Screening: Recommended annual ophthalmology exams for early detection of glaucoma and other disorders of the eye. Is the patient up to date with their annual eye exam?  Yes Who is the provider or what is the name of  the office in which the patient attends annual eye exams? [facility] Dr. Neville Route  Dental Screening: Recommended annual dental exams for proper oral hygiene  Community Resource Referral / Chronic Care Management: CRR required this visit?  No   CCM required this visit?  No      Plan:     I have personally reviewed and noted the following in the patient's chart:   . Medical and social history . Use of alcohol, tobacco or illicit drugs  . Current medications and supplements . Functional ability and status . Nutritional status . Physical activity . Advanced directives . List of other physicians . Hospitalizations, surgeries, and ER visits in previous 12 months . Vitals . Screenings to include cognitive, depression, and falls . Referrals and appointments  In addition, I have reviewed and discussed with patient certain preventive protocols, quality metrics, and best practice recommendations. A written personalized care plan for preventive services as well as general preventive health recommendations were provided to patient.     [name], LPN   [id number]   Nurse Notes: none

## 2020-07-02 DIAGNOSIS — H40003 Preglaucoma, unspecified, bilateral: Secondary | ICD-10-CM | POA: Diagnosis not present

## 2020-07-02 NOTE — Patient Instructions (Signed)
Julie Wall , Thank you for taking time to come for your Medicare Wellness Visit. I appreciate your ongoing commitment to your health goals. Please review the following plan we discussed and let me know if I can assist you in the future.   Screening recommendations/referrals: Colonoscopy: done 07/05/10. Declined repeat screening.  Mammogram: done 09/17/19. Please call [phone] to schedule your mammogram and bone density screening.  Recommended yearly ophthalmology/optometry visit for glaucoma screening and checkup Recommended yearly dental visit for hygiene and checkup  Vaccinations: Influenza vaccine: done 03/02/20 Pneumococcal vaccine: done 09/05/19 Tdap vaccine: done 01/25/12 Shingles vaccine: Shingrix discussed. Please contact your pharmacy for coverage information.  Covid-19: postponed  Advanced directives: Advance directive discussed with you today. Even though you declined this today please call our office should you change your mind and we can give you the proper paperwork for you to fill out.  Conditions/risks identified: Recommend increasing physical activity   Next appointment: Follow up in one year for your annual wellness visit    Preventive Care 65 Years and Older, Female Preventive care refers to lifestyle choices and visits with your health care provider that can promote health and wellness. What does preventive care include?  A yearly physical exam. This is also called an annual well check.  Dental exams once or twice a year.  Routine eye exams. Ask your health care provider how often you should have your eyes checked.  Personal lifestyle choices, including:  Daily care of your teeth and gums.  Regular physical activity.  Eating a healthy diet.  Avoiding tobacco and drug use.  Limiting alcohol use.  Practicing safe sex.  Taking low-dose aspirin every day.  Taking vitamin and mineral supplements as recommended by your health care provider. What happens  during an annual well check? The services and screenings done by your health care provider during your annual well check will depend on your age, overall health, lifestyle risk factors, and family history of disease. Counseling  Your health care provider may ask you questions about your:  Alcohol use.  Tobacco use.  Drug use.  Emotional well-being.  Home and relationship well-being.  Sexual activity.  Eating habits.  History of falls.  Memory and ability to understand (cognition).  Work and work Astronomer.  Reproductive health. Screening  You may have the following tests or measurements:  Height, weight, and BMI.  Blood pressure.  Lipid and cholesterol levels. These may be checked every 5 years, or more frequently if you are over 75 years old.  Skin check.  Lung cancer screening. You may have this screening every year starting at age 32 if you have a 30-pack-year history of smoking and currently smoke or have quit within the past 15 years.  Fecal occult blood test (FOBT) of the stool. You may have this test every year starting at age 23.  Flexible sigmoidoscopy or colonoscopy. You may have a sigmoidoscopy every 5 years or a colonoscopy every 10 years starting at age 78.  Hepatitis C blood test.  Hepatitis B blood test.  Sexually transmitted disease (STD) testing.  Diabetes screening. This is done by checking your blood sugar (glucose) after you have not eaten for a while (fasting). You may have this done every 1-3 years.  Bone density scan. This is done to screen for osteoporosis. You may have this done starting at age 43.  Mammogram. This may be done every 1-2 years. Talk to your health care provider about how often you should have regular mammograms. Talk  with your health care provider about your test results, treatment options, and if necessary, the need for more tests. Vaccines  Your health care provider may recommend certain vaccines, such  as:  Influenza vaccine. This is recommended every year.  Tetanus, diphtheria, and acellular pertussis (Tdap, Td) vaccine. You may need a Td booster every 10 years.  Zoster vaccine. You may need this after age 4.  Pneumococcal 13-valent conjugate (PCV13) vaccine. One dose is recommended after age 15.  Pneumococcal polysaccharide (PPSV23) vaccine. One dose is recommended after age 51. Talk to your health care provider about which screenings and vaccines you need and how often you need them. This information is not intended to replace advice given to you by your health care provider. Make sure you discuss any questions you have with your health care provider. Document Released: 06/18/2015 Document Revised: 02/09/2016 Document Reviewed: 03/23/2015 Elsevier Interactive Patient Education  2017 [place] Prevention in the Home Falls can cause injuries. They can happen to people of all ages. There are many things you can do to make your home safe and to help prevent falls. What can I do on the outside of my home?  Regularly fix the edges of walkways and driveways and fix any cracks.  Remove anything that might make you trip as you walk through a door, such as a raised step or threshold.  Trim any bushes or trees on the path to your home.  Use bright outdoor lighting.  Clear any walking paths of anything that might make someone trip, such as rocks or tools.  Regularly check to see if handrails are loose or broken. Make sure that both sides of any steps have handrails.  Any raised decks and porches should have guardrails on the edges.  Have any leaves, snow, or ice cleared regularly.  Use sand or salt on walking paths during winter.  Clean up any spills in your garage right away. This includes oil or grease spills. What can I do in the bathroom?  Use night lights.  Install grab bars by the toilet and in the tub and shower. Do not use towel bars as grab bars.  Use  non-skid mats or decals in the tub or shower.  If you need to sit down in the shower, use a plastic, non-slip stool.  Keep the floor dry. Clean up any water that spills on the floor as soon as it happens.  Remove soap buildup in the tub or shower regularly.  Attach bath mats securely with double-sided non-slip rug tape.  Do not have throw rugs and other things on the floor that can make you trip. What can I do in the bedroom?  Use night lights.  Make sure that you have a light by your bed that is easy to reach.  Do not use any sheets or blankets that are too big for your bed. They should not hang down onto the floor.  Have a firm chair that has side arms. You can use this for support while you get dressed.  Do not have throw rugs and other things on the floor that can make you trip. What can I do in the kitchen?  Clean up any spills right away.  Avoid walking on wet floors.  Keep items that you use a lot in easy-to-reach places.  If you need to reach something above you, use a strong step stool that has a grab bar.  Keep electrical cords out of the way.  Do  not use floor polish or wax that makes floors slippery. If you must use wax, use non-skid floor wax.  Do not have throw rugs and other things on the floor that can make you trip. What can I do with my stairs?  Do not leave any items on the stairs.  Make sure that there are handrails on both sides of the stairs and use them. Fix handrails that are broken or loose. Make sure that handrails are as [place] as the stairways.  Check any carpeting to make sure that it is firmly attached to the stairs. Fix any carpet that is loose or worn.  Avoid having throw rugs at the top or bottom of the stairs. If you do have throw rugs, attach them to the floor with carpet tape.  Make sure that you have a light switch at the top of the stairs and the bottom of the stairs. If you do not have them, ask someone to add them for you. What  else can I do to help prevent falls?  Wear shoes that:  Do not have high heels.  Have rubber bottoms.  Are comfortable and fit you well.  Are closed at the toe. Do not wear sandals.  If you use a stepladder:  Make sure that it is fully opened. Do not climb a closed stepladder.  Make sure that both sides of the stepladder are locked into place.  Ask someone to hold it for you, if possible.  Clearly mark and make sure that you can see:  Any grab bars or handrails.  First and last steps.  Where the edge of each step is.  Use tools that help you move around (mobility aids) if they are needed. These include:  Canes.  Walkers.  Scooters.  Crutches.  Turn on the lights when you go into a dark area. Replace any light bulbs as soon as they burn out.  Set up your furniture so you have a clear path. Avoid moving your furniture around.  If any of your floors are uneven, fix them.  If there are any pets around you, be aware of where they are.  Review your medicines with your doctor. Some medicines can make you feel dizzy. This can increase your chance of falling. Ask your doctor what other things that you can do to help prevent falls. This information is not intended to replace advice given to you by your health care provider. Make sure you discuss any questions you have with your health care provider. Document Released: 03/18/2009 Document Revised: 10/28/2015 Document Reviewed: 06/26/2014 Elsevier Interactive Patient Education  2017 [employer].

## 2020-07-05 ENCOUNTER — Ambulatory Visit: Payer: Medicare Other

## 2020-07-05 ENCOUNTER — Other Ambulatory Visit: Payer: Self-pay

## 2020-07-05 DIAGNOSIS — R0683 Snoring: Secondary | ICD-10-CM

## 2020-07-05 DIAGNOSIS — G4733 Obstructive sleep apnea (adult) (pediatric): Secondary | ICD-10-CM | POA: Diagnosis not present

## 2020-07-07 ENCOUNTER — Telehealth: Payer: Self-pay | Admitting: Pulmonary Disease

## 2020-07-07 ENCOUNTER — Encounter: Payer: Self-pay | Admitting: Pulmonary Disease

## 2020-07-07 DIAGNOSIS — G4733 Obstructive sleep apnea (adult) (pediatric): Secondary | ICD-10-CM

## 2020-07-07 NOTE — Telephone Encounter (Addendum)
HST 07/06/20 showed moderate obstructive sleep apnea with an AHI 23.9, SpO2 low 74%   Will route to Dr. Jayme Cloud to follow up with patient.

## 2020-07-07 NOTE — Telephone Encounter (Signed)
Lm for patient.  

## 2020-07-07 NOTE — Telephone Encounter (Signed)
She has moderate sleep apnea, start AutoPap 5 to 20 cm H2O with mask of choice.

## 2020-07-08 NOTE — Telephone Encounter (Signed)
Patient is aware of results and voiced her understanding.  She is agreeable with recommendations.  cpap order has been placed.  Recall has been placed for 09/2020. Nothing further needed at this time.

## 2020-07-13 DIAGNOSIS — J9601 Acute respiratory failure with hypoxia: Secondary | ICD-10-CM | POA: Diagnosis not present

## 2020-07-13 DIAGNOSIS — R531 Weakness: Secondary | ICD-10-CM | POA: Diagnosis not present

## 2020-07-13 DIAGNOSIS — U071 COVID-19: Secondary | ICD-10-CM | POA: Diagnosis not present

## 2020-07-28 ENCOUNTER — Telehealth: Payer: Self-pay | Admitting: Family Medicine

## 2020-07-28 NOTE — Telephone Encounter (Signed)
Requested medication (s) are due for refill today: Yes  Requested medication (s) are on the active medication list: Yes  Last refill:  05/19/20  Future visit scheduled: Yes  Notes to clinic:  Unable to refill per protocol, cannot delegate, last refilled by historical provider     Requested Prescriptions  Pending Prescriptions Disp Refills   AquaLance Lancets 30G MISC [Pharmacy Med Name: AQUALANCE LANCETS UL     TRA THIN 30G 30G MISC] 200 each 3    Sig: CHECK BLOOD SUGAR 2 TIMES A DAY      Endocrinology: Diabetes - Testing Supplies Passed - 07/28/2020  3:51 PM      Passed - Valid encounter within last 12 months    Recent Outpatient Visits           1 month ago Type 2 diabetes mellitus with other diabetic kidney complication [facility])   [facility] Alba Cory, MD   3 months ago Rhinosinusitis   [facility], Sheliah Mends, PA-C   4 months ago Type 2 diabetes mellitus with other diabetic kidney complication [facility])   [facility] Alba Cory, MD   5 months ago Pneumonia due to COVID-19 virus   [facility] Alba Cory, MD   5 months ago Dysuria   [facility] Alba Cory, MD       Future Appointments             In 2 months Alba Cory, MD [facility]   In 2 months Alba Cory, MD [facility], [facility]   In 11 months  [facility]               Alcohol Swabs (ALCOHOL PREP) 70 % PADS [Pharmacy Med Name: ALCOHOL PREP PADS  PAD] 200 each 3    Sig: CHECK BLOOD SUGAR 2 TIMES A DAY      Off-Protocol Failed - 07/28/2020  3:51 PM      Failed - Medication not assigned to a protocol, review manually.      Passed - Valid encounter within last 12 months    Recent Outpatient Visits           1 month ago Type 2 diabetes mellitus with other diabetic kidney complication [facility])   [facility] Alba Cory, MD   3 months ago Rhinosinusitis   [facility] Danelle Berry, PA-C   4 months ago Type 2 diabetes mellitus with other diabetic kidney complication [facility])   [facility] Alba Cory, MD   5 months ago Pneumonia due to COVID-19 virus   [facility] Alba Cory, MD   5 months ago Dysuria   [facility] Alba Cory, MD       Future Appointments             In 2 months Alba Cory, MD [facility], [facility]   In 2 months Alba Cory, MD [facility], [facility]   In 11 months  [facility], [facility]

## 2020-07-29 ENCOUNTER — Other Ambulatory Visit: Payer: Self-pay

## 2020-07-29 MED ORDER — AQUALANCE LANCETS 30G MISC: 3 refills | Status: DC

## 2020-08-05 DIAGNOSIS — M4714 Other spondylosis with myelopathy, thoracic region: Secondary | ICD-10-CM | POA: Diagnosis not present

## 2020-08-05 DIAGNOSIS — M48062 Spinal stenosis, lumbar region with neurogenic claudication: Secondary | ICD-10-CM | POA: Diagnosis not present

## 2020-08-09 DIAGNOSIS — H168 Other keratitis: Secondary | ICD-10-CM | POA: Diagnosis not present

## 2020-08-10 DIAGNOSIS — H169 Unspecified keratitis: Secondary | ICD-10-CM | POA: Diagnosis not present

## 2020-08-10 DIAGNOSIS — R531 Weakness: Secondary | ICD-10-CM | POA: Diagnosis not present

## 2020-08-10 DIAGNOSIS — U071 COVID-19: Secondary | ICD-10-CM | POA: Diagnosis not present

## 2020-08-10 DIAGNOSIS — J9601 Acute respiratory failure with hypoxia: Secondary | ICD-10-CM | POA: Diagnosis not present

## 2020-08-11 DIAGNOSIS — H168 Other keratitis: Secondary | ICD-10-CM | POA: Diagnosis not present

## 2020-08-13 DIAGNOSIS — H168 Other keratitis: Secondary | ICD-10-CM | POA: Diagnosis not present

## 2020-08-16 DIAGNOSIS — H168 Other keratitis: Secondary | ICD-10-CM | POA: Diagnosis not present

## 2020-08-23 DIAGNOSIS — H168 Other keratitis: Secondary | ICD-10-CM | POA: Diagnosis not present

## 2020-08-26 DIAGNOSIS — M5136 Other intervertebral disc degeneration, lumbar region: Secondary | ICD-10-CM | POA: Diagnosis not present

## 2020-08-26 DIAGNOSIS — M48062 Spinal stenosis, lumbar region with neurogenic claudication: Secondary | ICD-10-CM | POA: Diagnosis not present

## 2020-08-26 DIAGNOSIS — M5416 Radiculopathy, lumbar region: Secondary | ICD-10-CM | POA: Diagnosis not present

## 2020-08-30 ENCOUNTER — Other Ambulatory Visit: Payer: Self-pay | Admitting: Family Medicine

## 2020-08-30 DIAGNOSIS — E1142 Type 2 diabetes mellitus with diabetic polyneuropathy: Secondary | ICD-10-CM

## 2020-08-30 NOTE — Telephone Encounter (Signed)
  Notes to clinic: script requested is expired Not on current medication list  Review for continued use    Requested Prescriptions  Pending Prescriptions Disp Refills   ACCU-CHEK AVIVA PLUS test strip [Pharmacy Med Name: ACCU-CHEK AVIVA PLUS STRIP] 100 each 2    Sig: CHECK BLOOD SUGAR ONCE DAILY      Endocrinology: Diabetes - Testing Supplies Passed - 08/30/2020 11:17 AM      Passed - Valid encounter within last 12 months    Recent Outpatient Visits           2 months ago Type 2 diabetes mellitus with other diabetic kidney complication [facility])   [facility], MD   4 months ago Rhinosinusitis   [facility] Danelle Berry, PA-C   6 months ago Type 2 diabetes mellitus with other diabetic kidney complication [facility])   [facility] Alba Cory, MD   6 months ago Pneumonia due to COVID-19 virus   [facility] Alba Cory, MD   6 months ago Dysuria   [facility] Alba Cory, MD       Future Appointments             In 1 month Alba Cory, MD [facility], [facility]   In 1 month Alba Cory, MD [facility], [facility]   In 10 months  [facility], [facility]

## 2020-09-07 DIAGNOSIS — H168 Other keratitis: Secondary | ICD-10-CM | POA: Diagnosis not present

## 2020-09-09 DIAGNOSIS — G2581 Restless legs syndrome: Secondary | ICD-10-CM | POA: Diagnosis not present

## 2020-09-09 DIAGNOSIS — E559 Vitamin D deficiency, unspecified: Secondary | ICD-10-CM | POA: Diagnosis not present

## 2020-09-09 DIAGNOSIS — M5417 Radiculopathy, lumbosacral region: Secondary | ICD-10-CM | POA: Diagnosis not present

## 2020-09-09 DIAGNOSIS — E538 Deficiency of other specified B group vitamins: Secondary | ICD-10-CM | POA: Diagnosis not present

## 2020-09-09 DIAGNOSIS — G5712 Meralgia paresthetica, left lower limb: Secondary | ICD-10-CM | POA: Diagnosis not present

## 2020-09-09 DIAGNOSIS — E1142 Type 2 diabetes mellitus with diabetic polyneuropathy: Secondary | ICD-10-CM | POA: Diagnosis not present

## 2020-09-10 DIAGNOSIS — U071 COVID-19: Secondary | ICD-10-CM | POA: Diagnosis not present

## 2020-09-10 DIAGNOSIS — R531 Weakness: Secondary | ICD-10-CM | POA: Diagnosis not present

## 2020-09-10 DIAGNOSIS — J9601 Acute respiratory failure with hypoxia: Secondary | ICD-10-CM | POA: Diagnosis not present

## 2020-09-23 ENCOUNTER — Other Ambulatory Visit: Payer: Self-pay

## 2020-09-28 NOTE — Progress Notes (Signed)
No seen

## 2020-09-29 ENCOUNTER — Other Ambulatory Visit: Payer: Self-pay

## 2020-09-29 ENCOUNTER — Encounter (INDEPENDENT_AMBULATORY_CARE_PROVIDER_SITE_OTHER): Payer: Medicare Other | Admitting: Family Medicine

## 2020-09-29 ENCOUNTER — Encounter: Payer: Self-pay | Admitting: Family Medicine

## 2020-09-29 DIAGNOSIS — Z1211 Encounter for screening for malignant neoplasm of colon: Secondary | ICD-10-CM

## 2020-10-10 DIAGNOSIS — J9601 Acute respiratory failure with hypoxia: Secondary | ICD-10-CM | POA: Diagnosis not present

## 2020-10-10 DIAGNOSIS — U071 COVID-19: Secondary | ICD-10-CM | POA: Diagnosis not present

## 2020-10-10 DIAGNOSIS — R531 Weakness: Secondary | ICD-10-CM | POA: Diagnosis not present

## 2020-10-15 ENCOUNTER — Other Ambulatory Visit: Payer: Self-pay | Admitting: Family Medicine

## 2020-10-15 DIAGNOSIS — H40009 Preglaucoma, unspecified, unspecified eye: Secondary | ICD-10-CM | POA: Diagnosis not present

## 2020-10-15 DIAGNOSIS — R809 Proteinuria, unspecified: Secondary | ICD-10-CM

## 2020-10-15 DIAGNOSIS — H40003 Preglaucoma, unspecified, bilateral: Secondary | ICD-10-CM | POA: Diagnosis not present

## 2020-10-15 DIAGNOSIS — E1129 Type 2 diabetes mellitus with other diabetic kidney complication: Secondary | ICD-10-CM

## 2020-10-18 ENCOUNTER — Telehealth: Payer: Self-pay

## 2020-10-18 DIAGNOSIS — E119 Type 2 diabetes mellitus without complications: Secondary | ICD-10-CM | POA: Diagnosis not present

## 2020-10-18 LAB — HM DIABETES EYE EXAM

## 2020-10-18 NOTE — Telephone Encounter (Signed)
Lm for reminder of covid test prior to PFT  10[facility]20/2020 [facility]

## 2020-10-19 ENCOUNTER — Encounter: Payer: Medicare Other | Admitting: Unknown Physician Specialty

## 2020-10-19 NOTE — Telephone Encounter (Signed)
Patient is aware of date/time of covid test and voiced her understanding.   

## 2020-10-20 ENCOUNTER — Other Ambulatory Visit: Payer: Self-pay

## 2020-10-20 ENCOUNTER — Other Ambulatory Visit
Admission: RE | Admit: 2020-10-20 | Discharge: 2020-10-20 | Disposition: A | Discharge status: Home / Self Care | Payer: Medicare Other | Source: Ambulatory Visit | Attending: Pulmonary Disease | Admitting: Pulmonary Disease

## 2020-10-20 DIAGNOSIS — Z20822 Contact with and (suspected) exposure to covid-19: Secondary | ICD-10-CM | POA: Insufficient documentation

## 2020-10-20 DIAGNOSIS — Z01812 Encounter for preprocedural laboratory examination: Secondary | ICD-10-CM | POA: Insufficient documentation

## 2020-10-20 LAB — SARS CORONAVIRUS 2 (TAT 6-24 HRS): SARS Coronavirus 2: NEGATIVE

## 2020-10-21 ENCOUNTER — Ambulatory Visit: Payer: Medicare Other | Attending: Pulmonary Disease

## 2020-10-21 ENCOUNTER — Encounter: Payer: Self-pay | Admitting: Family Medicine

## 2020-10-21 DIAGNOSIS — F17210 Nicotine dependence, cigarettes, uncomplicated: Secondary | ICD-10-CM | POA: Insufficient documentation

## 2020-10-21 DIAGNOSIS — J449 Chronic obstructive pulmonary disease, unspecified: Secondary | ICD-10-CM | POA: Insufficient documentation

## 2020-10-21 MED ORDER — ALBUTEROL SULFATE (2.5 MG/3ML) 0.083% IN NEBU
2.5000 mg | INHALATION_SOLUTION | Freq: Once | RESPIRATORY_TRACT | Status: AC
  Administered 2020-10-21: 2.5 mg via RESPIRATORY_TRACT
  Filled 2020-10-21: qty 3

## 2020-10-22 ENCOUNTER — Encounter: Payer: Self-pay | Admitting: Family Medicine

## 2020-10-22 ENCOUNTER — Other Ambulatory Visit: Payer: Self-pay

## 2020-10-22 ENCOUNTER — Ambulatory Visit (INDEPENDENT_AMBULATORY_CARE_PROVIDER_SITE_OTHER): Payer: Medicare Other | Admitting: Family Medicine

## 2020-10-22 VITALS — BP 118/66 | HR 101 | Temp 98.2°F | Resp 16 | Ht 64.0 in | Wt 162.0 lb

## 2020-10-22 DIAGNOSIS — Z1211 Encounter for screening for malignant neoplasm of colon: Secondary | ICD-10-CM

## 2020-10-22 DIAGNOSIS — G2581 Restless legs syndrome: Secondary | ICD-10-CM

## 2020-10-22 DIAGNOSIS — M545 Low back pain, unspecified: Secondary | ICD-10-CM | POA: Diagnosis not present

## 2020-10-22 DIAGNOSIS — J410 Simple chronic bronchitis: Secondary | ICD-10-CM

## 2020-10-22 DIAGNOSIS — L679 Hair color and hair shaft abnormality, unspecified: Secondary | ICD-10-CM

## 2020-10-22 DIAGNOSIS — E559 Vitamin D deficiency, unspecified: Secondary | ICD-10-CM

## 2020-10-22 DIAGNOSIS — G9519 Other vascular myelopathies: Secondary | ICD-10-CM | POA: Diagnosis not present

## 2020-10-22 DIAGNOSIS — R809 Proteinuria, unspecified: Secondary | ICD-10-CM

## 2020-10-22 DIAGNOSIS — I700 Atherosclerosis of aorta: Secondary | ICD-10-CM

## 2020-10-22 DIAGNOSIS — E1142 Type 2 diabetes mellitus with diabetic polyneuropathy: Secondary | ICD-10-CM | POA: Diagnosis not present

## 2020-10-22 DIAGNOSIS — E538 Deficiency of other specified B group vitamins: Secondary | ICD-10-CM | POA: Diagnosis not present

## 2020-10-22 DIAGNOSIS — E1159 Type 2 diabetes mellitus with other circulatory complications: Secondary | ICD-10-CM | POA: Diagnosis not present

## 2020-10-22 DIAGNOSIS — E1129 Type 2 diabetes mellitus with other diabetic kidney complication: Secondary | ICD-10-CM | POA: Diagnosis not present

## 2020-10-22 DIAGNOSIS — I152 Hypertension secondary to endocrine disorders: Secondary | ICD-10-CM | POA: Diagnosis not present

## 2020-10-22 DIAGNOSIS — E785 Hyperlipidemia, unspecified: Secondary | ICD-10-CM

## 2020-10-22 DIAGNOSIS — J8489 Other specified interstitial pulmonary diseases: Secondary | ICD-10-CM

## 2020-10-22 DIAGNOSIS — Z8616 Personal history of COVID-19: Secondary | ICD-10-CM

## 2020-10-22 DIAGNOSIS — G8929 Other chronic pain: Secondary | ICD-10-CM

## 2020-10-22 LAB — POCT GLYCOSYLATED HEMOGLOBIN (HGB A1C): Hemoglobin A1C: 6.8 % — AB (ref 4.0–5.6)

## 2020-10-22 MED ORDER — HYDROCODONE-ACETAMINOPHEN 5-325 MG PO TABS: 2.0000 | ORAL_TABLET | Freq: Two times a day (BID) | ORAL | 0 refills | Status: DC | PRN

## 2020-10-22 MED ORDER — ANORO ELLIPTA 62.5-25 MCG/INH IN AEPB: 1.0000 | INHALATION_SPRAY | Freq: Every day | RESPIRATORY_TRACT | 2 refills | Status: DC

## 2020-10-22 NOTE — Progress Notes (Signed)
Name: Julie Wall   MRN: [id number]    DOB: November 06, 1955   Date:10/22/2020       Progress Note  Subjective  Chief Complaint  Follow Up  HPI  Diabetes follow up: she was seen in April and A1C has been well controlled lately, today is 6.8 % ( at goal ) she states glucose at home around 120's She has been avoiding sweets , changed to Zero calories sodas .She also has associated obesity, dyslipidemia,  and microalbuminuria. She is on Jardiance, Taking metformin two in am. We will stop losartan today due to low bp over the past couple visits and monitor microalbuminuria.   Eczema: she sees dermatologist at [facility], last visit was with Dr. Dub Mikes- [place]omin[place]. She is taking Methotrexate 15 mg weekly . Eczema is good, but having severe hair loss/breaking   HTN: she has occasional dizziness, we will stop losartan   Hyperlipidemia:she is backAtorvastatinand aspirin daily,last LDL was at goal at 32 , HDL was low, she does not like nuts, but has been eating fish twice a week We will recheck labs today   COPD/Intesticial lung disease on CT chest : she quit smoking 12/03/2016 and again in 2021 , but resumed smokingFall 2019l,she quit again when she developed COVID-19 . She is taking Anoro. She states since pneumonia COVID-19 she has recovered well, no longer using oxygen. Saw Dr. Marcos Eke and recently had a spirometry that showed great response to albuterol, may have asthma component. She denies cough or sob, she has occasional wheezing, using Anoro daily and seems to help her breath better   Back pain: . She states right leg is always painful 9/10 when she is very activity  and takes hydrocodone prnShe denies leg weakness, no bowel or bladder incontinence, she has numbness on left lateral thigh. Discussed risk of addiction and importance of no diversion.She is doing okay with 30 pills of hydrocodone to last at least 4  months.She has been seeing Dr. Sherryll Burger who ordered MRI and results below, she was  advised to see neurosurgeon and is now having steroid injections, it seems to have helped   IMPRESSION/MRI thoracic spine 09/04/2019  Diffuse degenerative disc narrowing and bulging with small protrusions. Dominant findings at T11-12 where there is cord impingement with flattening and mild T2 hyperintensity by prior lumbar MRI.  IMPRESSION:08/03/2019  1. Moderate spinal stenosis L3-4 unchanged. Moderate subarticular stenosis bilaterally. 2. Severe spinal stenosis with progression since the prior study. Severe subarticular and foraminal stenosis on the left. 3. Moderate subarticular stenosis bilaterally L5-S1. 4. Progressive degenerative changes at T11-12 with significant spinal stenosis and mild cord signal abnormality. Thoracic MRI with dedicated axial images through this level may be helpful.  RLS: Shesees neurologist and is on higher dose of Requip  she still gets up at night, she will discuss it with Dr. Sherryll Burger   Depression: she states she is feeling better, she was very upset about her hair loss, she will discuss it with her dermatologist. She has a wig on and is okay now , does not want medication    Patient Active Problem List   Diagnosis Date Noted  . Diabetic polyneuropathy associated with type 2 diabetes mellitus (HCC)   . Weakness   . Hilar lymphadenopathy 02/05/2020  . History of COVID-19 02/05/2020  . Atherosclerosis of aorta (HCC) 05/04/2017  . Pap smear abnormality of cervix with ASCUS favoring benign 04/23/2017  . Vitamin D deficiency 12/14/2016  . Obesity (BMI 30.0-34.9) 12/13/2015  . Chronic bronchitis (HCC) 05/14/2015  .  Diabetes mellitus (HCC) 05/14/2015  . Positive H. pylori test 04/05/2015  . Rapid urease test for Helicobacter pylori infection positive 04/05/2015  . Umbilical hernia 04/01/2015  . Exomphalos 04/01/2015  . RLS (restless legs syndrome) 01/08/2015  . Type 2 diabetes mellitus with hyperlipidemia (HCC) 01/08/2015  . Neurogenic  claudication (HCC) 01/08/2015  . AD (atopic dermatitis) 12/28/2014  . Chronic LBP 12/28/2014  . Dyslipidemia 12/28/2014  . Gastroesophageal reflux disease without esophagitis 12/28/2014  . Microalbuminuria 12/28/2014  . Disorder of bone and cartilage 12/28/2014  . Neuralgia of left thigh 12/28/2014  . Perennial allergic rhinitis with seasonal variation 12/28/2014  . Tobacco abuse 12/28/2014  . Mononeuropathy of left lower extremity 12/28/2014  . Hypothyroidism 09/22/2005  . Hypertension, benign 09/22/2005    Past Surgical History:  Procedure Laterality Date  . CATARACT EXTRACTION Bilateral 1980  . CESAREAN SECTION    . CORNEAL TRANSPLANT Left    [facility]-Treatment for blindness.    Family History  Problem Relation Age of Onset  . Diabetes Mother   . Hypertension Mother   . Pancreatic cancer Mother   . Alcohol abuse Father   . Arthritis/Rheumatoid Sister   . Diabetes Sister   . Kidney disease Sister   . Diabetes Brother   . Breast cancer Neg Hx     Social History   Tobacco Use  . Smoking status: Former Smoker    Packs/day: 1.00    Years: 46.00    Pack years: 46.00    Types: Cigarettes    Start date: 01/07/1985    Quit date: 02/01/2020    Years since quitting: 0.7  . Smokeless tobacco: Never Used  Substance Use Topics  . Alcohol use: No    Alcohol/week: 0.0 standard drinks     Current Outpatient Medications:  .  ACCU-CHEK AVIVA PLUS test strip, CHECK BLOOD SUGAR ONCE DAILY, Disp: 100 each, Rfl: 2 .  albuterol (VENTOLIN HFA) 108 (90 Base) MCG/ACT inhaler, Inhale 2 puffs into the lungs every 6 (six) hours as needed for wheezing or shortness of breath., Disp: 18 g, Rfl: 0 .  Alcohol Swabs (ALCOHOL PREP) 70 % PADS, CHECK BLOOD SUGAR 2 TIMES A DAY, Disp: 200 each, Rfl: 3 .  AquaLance Lancets 30G MISC, CHECK BLOOD SUGAR 2 TIMES A DAY, Disp: 200 each, Rfl: 3 .  aspirin 81 MG chewable tablet, Chew 1 tablet by mouth daily., Disp: , Rfl:  .  atorvastatin (LIPITOR) 40 MG  tablet, Take 1 tablet (40 mg total) by mouth daily. In place of simvastatin, Disp: 90 tablet, Rfl: 1 .  baclofen (LIORESAL) 10 MG tablet, Take 10 mg by mouth 3 (three) times daily., Disp: , Rfl:  .  Baclofen 5 MG TABS, Take 1 tablet by mouth 2 (two) times daily as needed., Disp: , Rfl:  .  Cholecalciferol (VITAMIN D) 50 MCG (2000 UT) CAPS, Take 1 capsule (2,000 Units total) by mouth daily., Disp: 100 capsule, Rfl: 1 .  Cyanocobalamin (B-12) 1000 MCG SUBL, Place 1 tablet under the tongue daily., Disp: 100 tablet, Rfl: 1 .  dorzolamide-timolol (COSOPT) 22.3-6.8 MG/ML ophthalmic solution, Place 1 drop into both eyes 2 (two) times daily. , Disp: , Rfl:  .  DULoxetine (CYMBALTA) 20 MG capsule, Take 20 mg by mouth 2 (two) times daily., Disp: , Rfl:  .  empagliflozin (JARDIANCE) 25 MG TABS tablet, Take 1 tablet (25 mg total) by mouth daily., Disp: 90 tablet, Rfl: 1 .  folic acid (FOLVITE) 1 MG tablet, Take 1 mg by  mouth daily. , Disp: , Rfl:  .  gabapentin (NEURONTIN) 100 MG capsule, Take 200 mg by mouth 3 (three) times daily., Disp: , Rfl:  .  Lancet Devices (SIMPLE DIAGNOSTICS LANCING DEV) MISC, Check blood sugar once daily fasting, and up to 4 times daily PRN., Disp: 100 each, Rfl: 2 .  metFORMIN (GLUCOPHAGE-XR) 750 MG 24 hr tablet, Take 1 tablet (750 mg total) by mouth daily with breakfast., Disp: 90 tablet, Rfl: 1 .  methotrexate (RHEUMATREX) 2.5 MG tablet, Take 15 mg by mouth every Friday. , Disp: , Rfl:  .  POLYTRIM ophthalmic solution, Place into the left eye., Disp: , Rfl:  .  prednisoLONE acetate (PRED FORTE) 1 % ophthalmic suspension, , Disp: , Rfl:  .  rOPINIRole (REQUIP XL) 4 MG 24 hr tablet, Take 4 mg by mouth at bedtime. , Disp: , Rfl:  .  triamcinolone ointment (KENALOG) 0.1 %, Apply topically 2 (two) times daily., Disp: 30 g, Rfl: 0 .  VIGAMOX 0.5 % ophthalmic solution, , Disp: , Rfl:  .  HYDROcodone-acetaminophen (NORCO/VICODIN) 5-325 MG tablet, Take 2 tablets by mouth 2 (two) times  daily as needed for moderate pain., Disp: 30 tablet, Rfl: 0 .  umeclidinium-vilanterol (ANORO ELLIPTA) 62.5-25 MCG/INH AEPB, Inhale 1 puff into the lungs daily., Disp: 180 each, Rfl: 2  Allergies  Allergen Reactions  . Penicillins Rash    I personally reviewed active problem list, medication list, allergies, family history, social history, health maintenance with the patient/caregiver today.   ROS  Constitutional: Negative for fever or weight change.  Respiratory: Negative for cough and shortness of breath.  She has some wheezing  Cardiovascular: Negative for chest pain or palpitations.  Gastrointestinal: Negative for abdominal pain, no bowel changes.  Musculoskeletal: Negative for gait problem or joint swelling.  Skin: Negative for rash.  Neurological: Negative for dizziness or headache.  No other specific complaints in a complete review of systems (except as listed in HPI above).  Objective  Vitals:   10/22/20 1022  BP: 118/66  Pulse: (!) 101  Resp: 16  Temp: 98.2 F (36.8 C)  TempSrc: Oral  SpO2: 95%  Weight: 162 lb (73.5 kg)  Height: 5\' 4"  (1.626 m)    Body mass index is 27.81 kg/m.  Physical Exam  Constitutional: Patient appears well-developed and well-nourished.  No distress.  HEENT: head atraumatic, normocephalic, pupils equal and reactive to light, neck supple, Cardiovascular: Normal rate, regular rhythm and normal heart sounds.  No murmur heard. No BLE edema. Pulmonary/Chest: Effort normal and breath sounds normal. No respiratory distress. Abdominal: Soft.  There is no tenderness. Psychiatric: Patient has a normal mood and affect. behavior is normal. Judgment and thought content normal. Hair: very short, no bald spots, like it is breaking, she sees a dermatologist and will discuss with them  Recent Results (from the past 2160 hour(s))  HM DIABETES EYE EXAM     Status: None   Collection Time: 10/18/20 12:00 AM  Result Value Ref Range   HM Diabetic Eye  Exam No Retinopathy No Retinopathy  SARS CORONAVIRUS 2 (TAT 6-24 HRS) Nasopharyngeal Nasopharyngeal Swab     Status: None   Collection Time: 10/20/20  8:24 AM   Specimen: Nasopharyngeal Swab  Result Value Ref Range   SARS Coronavirus 2 NEGATIVE NEGATIVE    Comment: (NOTE) SARS-CoV-2 target nucleic acids are NOT DETECTED.  The SARS-CoV-2 RNA is generally detectable in upper and lower respiratory specimens during the acute phase of infection. Negative results do not  preclude SARS-CoV-2 infection, do not rule out co-infections with other pathogens, and should not be used as the sole basis for treatment or other patient management decisions. Negative results must be combined with clinical observations, patient history, and epidemiological information. The expected result is Negative.  Fact Sheet for Patients: HairSlick.no  Fact Sheet for Healthcare Providers: quierodirigir.com  This test is not yet approved or cleared by the [place] FDA and  has been authorized for detection and/or diagnosis of SARS-CoV-2 by FDA under an Emergency Use Authorization (EUA). This EUA will remain  in effect (meaning this test can be used) for the duration of the COVID-19 declaration under Se ction 564(b)(1) of the Act, 21 U.S.C. section 360bbb-3(b)(1), unless the authorization is terminated or revoked sooner.  Performed at [facility], [address] [place], [place] [address]   POCT HgB A1C     Status: Abnormal   Collection Time: 10/22/20 10:27 AM  Result Value Ref Range   Hemoglobin A1C 6.8 (A) 4.0 - 5.6 %   HbA1c POC (<> result, manual entry)     HbA1c, POC (prediabetic range)     HbA1c, POC (controlled diabetic range)       PHQ2/9: Depression screen [facility] 2/9 10/22/2020 09/29/2020 07/01/2020 06/14/2020 06/14/2020  Decreased Interest 0 0 0 0 0  Down, Depressed, Hopeless 0 0 0 0 0  PHQ - 2 Score 0 0 0 0 0  Altered sleeping - - - 1  -  Tired, decreased energy - - - 2 -  Change in appetite - - - 0 -  Feeling bad or failure about yourself  - - - 0 -  Trouble concentrating - - - 0 -  Moving slowly or fidgety/restless - - - 0 -  Suicidal thoughts - - - 0 -  PHQ-9 Score - - - 3 -  Difficult doing work/chores - - - Not difficult at all -  Some recent data might be hidden    phq 9 is negative   Fall Risk: Fall Risk  10/22/2020 09/29/2020 07/01/2020 06/14/2020 04/27/2020  Falls in the past year? 0 0 0 0 0  Number falls in past yr: 0 0 0 0 0  Injury with Fall? 0 0 0 0 0  Comment - - - - -  Risk Factor Category  - - - - -  Risk for fall due to : - - No Fall Risks - -  Risk for fall due to: Comment - - - - -  Follow up - - Falls prevention discussed - Falls evaluation completed     Functional Status Survey: Is the patient deaf or have difficulty hearing?: No Does the patient have difficulty seeing, even when wearing glasses/contacts?: No Does the patient have difficulty concentrating, remembering, or making decisions?: Yes Does the patient have difficulty walking or climbing stairs?: Yes Does the patient have difficulty dressing or bathing?: No Does the patient have difficulty doing errands alone such as visiting a doctor's office or shopping?: No   Assessment & Plan  1. Type 2 diabetes mellitus with other diabetic kidney complication (HCC)  - POCT HgB A1C  2. Colon cancer screening  - Fecal Globin By Immunochemistry  3. Hair problem  She will follow up with dermatologist, it could be covid , medication, fungal?  - Thyroid Panel With TSH  4. Vitamin D deficiency  - VITAMIN D 25 Hydroxy (Vit-D Deficiency, Fractures)  5. B12 deficiency  - B12 and Folate Panel  6.  DM type 2 with diabetic peripheral neuropathy (HCC)  - Microalbumin / creatinine urine ratio  7. Mucopurulent chronic bronchitis (HCC)   8. Interstitial pneumonitis (HCC)  Stable, seeing Dr. Marcos Eke   9. Atherosclerosis of aorta  (HCC)  - Lipid panel  10. RLS (restless legs syndrome)   11. Neurogenic claudication (HCC)  - HYDROcodone-acetaminophen (NORCO/VICODIN) 5-325 MG tablet; Take 2 tablets by mouth 2 (two) times daily as needed for moderate pain.  Dispense: 30 tablet; Refill: 0  12. Chronic bilateral low back pain without sciatica  - HYDROcodone-acetaminophen (NORCO/VICODIN) 5-325 MG tablet; Take 2 tablets by mouth 2 (two) times daily as needed for moderate pain.  Dispense: 30 tablet; Refill: 0  13. Dyslipidemia   14. Type 2 diabetes mellitus with microalbuminuria, without [place]-term current use of insulin (HCC)   15. Hypertension associated with diabetes (HCC)  - CBC with Differential/Platelet - COMPLETE METABOLIC PANEL WITH GFR - Thyroid Panel With TSH  16. History of COVID-19

## 2020-10-23 LAB — CBC WITH DIFFERENTIAL/PLATELET
Absolute Monocytes: 300 cells/uL (ref 200–950)
Basophils Absolute: 16 cells/uL (ref 0–200)
Basophils Relative: 0.2 %
Eosinophils Absolute: 251 cells/uL (ref 15–500)
Eosinophils Relative: 3.1 %
HCT: 43.6 % (ref 35.0–45.0)
Hemoglobin: 14.9 g/dL (ref 11.7–15.5)
Lymphs Abs: 2657 cells/uL (ref 850–3900)
MCH: 31.7 pg (ref 27.0–33.0)
MCHC: 34.2 g/dL (ref 32.0–36.0)
MCV: 92.8 fL (ref 80.0–100.0)
MPV: 11.3 fL (ref 7.5–12.5)
Monocytes Relative: 3.7 %
Neutro Abs: 4876 cells/uL (ref 1500–7800)
Neutrophils Relative %: 60.2 %
Platelets: 205 10*3/uL (ref 140–400)
RBC: 4.7 10*6/uL (ref 3.80–5.10)
RDW: 12.6 % (ref 11.0–15.0)
Total Lymphocyte: 32.8 %
WBC: 8.1 10*3/uL (ref 3.8–10.8)

## 2020-10-23 LAB — COMPLETE METABOLIC PANEL WITH GFR
AG Ratio: 1.2 (calc) (ref 1.0–2.5)
ALT: 9 U/L (ref 6–29)
AST: 10 U/L (ref 10–35)
Albumin: 3.7 g/dL (ref 3.6–5.1)
Alkaline phosphatase (APISO): 92 U/L (ref 37–153)
BUN: 12 mg/dL (ref 7–25)
CO2: 25 mmol/L (ref 20–32)
Calcium: 9.4 mg/dL (ref 8.6–10.4)
Chloride: 107 mmol/L (ref 98–110)
Creat: 0.56 mg/dL (ref 0.50–0.99)
GFR, Est African American: 113 mL/min/{1.73_m2} (ref >=60)
GFR, Est Non African American: 97 mL/min/{1.73_m2} (ref >=60)
Globulin: 3 g/dL (calc) (ref 1.9–3.7)
Glucose, Bld: 120 mg/dL — ABNORMAL HIGH (ref 65–99)
Potassium: 4.1 mmol/L (ref 3.5–5.3)
Sodium: 140 mmol/L (ref 135–146)
Total Bilirubin: 0.3 mg/dL (ref 0.2–1.2)
Total Protein: 6.7 g/dL (ref 6.1–8.1)

## 2020-10-23 LAB — LIPID PANEL
Cholesterol: 92 mg/dL (ref <200)
HDL: 45 mg/dL — ABNORMAL LOW (ref >=50)
LDL Cholesterol (Calc): 34 mg/dL (calc)
Non-HDL Cholesterol (Calc): 47 mg/dL (calc) (ref <130)
Total CHOL/HDL Ratio: 2 (calc) (ref <5.0)
Triglycerides: 46 mg/dL (ref <150)

## 2020-10-23 LAB — THYROID PANEL WITH TSH
Free Thyroxine Index: 2.5 (ref 1.4–3.8)
T3 Uptake: 34 % (ref 22–35)
T4, Total: 7.3 ug/dL (ref 5.1–11.9)
TSH: 0.94 mIU/L (ref 0.40–4.50)

## 2020-10-23 LAB — MICROALBUMIN / CREATININE URINE RATIO
Creatinine, Urine: 56 mg/dL (ref 20–275)
Microalb Creat Ratio: 4 mcg/mg creat (ref <30)
Microalb, Ur: 0.2 mg/dL

## 2020-10-23 LAB — B12 AND FOLATE PANEL
Folate: 14.9 ng/mL
Vitamin B-12: 352 pg/mL (ref 200–1100)

## 2020-10-23 LAB — VITAMIN D 25 HYDROXY (VIT D DEFICIENCY, FRACTURES): Vit D, 25-Hydroxy: 30 ng/mL (ref 30–100)

## 2020-10-26 ENCOUNTER — Encounter: Payer: Self-pay | Admitting: Unknown Physician Specialty

## 2020-10-26 ENCOUNTER — Other Ambulatory Visit: Payer: Self-pay

## 2020-10-26 ENCOUNTER — Ambulatory Visit (INDEPENDENT_AMBULATORY_CARE_PROVIDER_SITE_OTHER): Payer: Medicare Other | Admitting: Unknown Physician Specialty

## 2020-10-26 ENCOUNTER — Other Ambulatory Visit (HOSPITAL_COMMUNITY)
Admission: RE | Admit: 2020-10-26 | Discharge: 2020-10-26 | Disposition: A | Discharge status: Home / Self Care | Payer: Medicare Other | Source: Ambulatory Visit | Attending: Family Medicine | Admitting: Family Medicine

## 2020-10-26 DIAGNOSIS — Z1151 Encounter for screening for human papillomavirus (HPV): Secondary | ICD-10-CM | POA: Insufficient documentation

## 2020-10-26 DIAGNOSIS — R87610 Atypical squamous cells of undetermined significance on cytologic smear of cervix (ASC-US): Secondary | ICD-10-CM | POA: Insufficient documentation

## 2020-10-26 DIAGNOSIS — Z01419 Encounter for gynecological examination (general) (routine) without abnormal findings: Secondary | ICD-10-CM | POA: Insufficient documentation

## 2020-10-26 NOTE — Patient Instructions (Signed)
Preventive Care 35 Years and Older, Female Preventive care refers to lifestyle choices and visits with your health care provider that can promote health and wellness. This includes:  A yearly physical exam. This is also called an annual wellness visit.  Regular dental and eye exams.  Immunizations.  Screening for certain conditions.  Healthy lifestyle choices, such as: ? Eating a healthy diet. ? Getting regular exercise. ? Not using drugs or products that contain nicotine and tobacco. ? Limiting alcohol use. What can I expect for my preventive care visit? Physical exam Your health care provider will check your:  Height and weight. These may be used to calculate your BMI (body mass index). BMI is a measurement that tells if you are at a healthy weight.  Heart rate and blood pressure.  Body temperature.  Skin for abnormal spots. Counseling Your health care provider may ask you questions about your:  Past medical problems.  Family's medical history.  Alcohol, tobacco, and drug use.  Emotional well-being.  Home life and relationship well-being.  Sexual activity.  Diet, exercise, and sleep habits.  History of falls.  Memory and ability to understand (cognition).  Work and work Statistician.  Pregnancy and menstrual history.  Access to firearms. What immunizations do I need? Vaccines are usually given at various ages, according to a schedule. Your health care provider will recommend vaccines for you based on your age, medical history, and lifestyle or other factors, such as travel or where you work.   What tests do I need? Blood tests  Lipid and cholesterol levels. These may be checked every 5 years, or more often depending on your overall health.  Hepatitis C test.  Hepatitis B test. Screening  Lung cancer screening. You may have this screening every year starting at age 50 if you have a 30-pack-year history of smoking and currently smoke or have quit within  the past 15 years.  Colorectal cancer screening. ? All adults should have this screening starting at age 18 and continuing until age 63. ? Your health care provider may recommend screening at age 31 if you are at increased risk. ? You will have tests every 1-10 years, depending on your results and the type of screening test.  Diabetes screening. ? This is done by checking your blood sugar (glucose) after you have not eaten for a while (fasting). ? You may have this done every 1-3 years.  Mammogram. ? This may be done every 1-2 years. ? Talk with your health care provider about how often you should have regular mammograms.  Abdominal aortic aneurysm (AAA) screening. You may need this if you are a current or former smoker.  BRCA-related cancer screening. This may be done if you have a family history of breast, ovarian, tubal, or peritoneal cancers. Other tests  STD (sexually transmitted disease) testing, if you are at risk.  Bone density scan. This is done to screen for osteoporosis. You may have this done starting at age 100. Talk with your health care provider about your test results, treatment options, and if necessary, the need for more tests. Follow these instructions at home: Eating and drinking  Eat a diet that includes fresh fruits and vegetables, whole grains, lean protein, and low-fat dairy products. Limit your intake of foods with high amounts of sugar, saturated fats, and salt.  Take vitamin and mineral supplements as recommended by your health care provider.  Do not drink alcohol if your health care provider tells you not to drink.  If you drink alcohol: ? Limit how much you have to 0-1 drink a day. ? Be aware of how much alcohol is in your drink. In the [place] one drink equals one 12 oz bottle of beer (355 mL), one 5 oz glass of wine (148 mL), or one 1 oz glass of hard liquor (44 mL).   Lifestyle  Take daily care of your teeth and gums. Brush your teeth every morning  and night with fluoride toothpaste. Floss one time each day.  Stay active. Exercise for at least 30 minutes 5 or more days each week.  Do not use any products that contain nicotine or tobacco, such as cigarettes, e-cigarettes, and chewing tobacco. If you need help quitting, ask your health care provider.  Do not use drugs.  If you are sexually active, practice safe sex. Use a condom or other form of protection in order to prevent STIs (sexually transmitted infections).  Talk with your health care provider about taking a low-dose aspirin or statin.  Find healthy ways to cope with stress, such as: ? Meditation, yoga, or listening to music. ? Journaling. ? Talking to a trusted person. ? Spending time with friends and family. Safety  Always wear your seat belt while driving or riding in a vehicle.  Do not drive: ? If you have been drinking alcohol. Do not ride with someone who has been drinking. ? When you are tired or distracted. ? While texting.  Wear a helmet and other protective equipment during sports activities.  If you have firearms in your house, make sure you follow all gun safety procedures. What's next?  Visit your health care provider once a year for an annual wellness visit.  Ask your health care provider how often you should have your eyes and teeth checked.  Stay up to date on all vaccines. This information is not intended to replace advice given to you by your health care provider. Make sure you discuss any questions you have with your health care provider. Document Revised: 05/12/2020 Document Reviewed: 05/16/2018 Elsevier Patient Education  2021 [employer].

## 2020-10-26 NOTE — Assessment & Plan Note (Addendum)
Pap, though inadequate collected.  Will check HPV.  Will monitor for f/u with results

## 2020-10-26 NOTE — Progress Notes (Signed)
BP 124/72   Pulse 95   Temp 98 F (36.7 C) (Oral)   Resp 16   Ht 5\' 4"  (1.626 m)   Wt 165 lb 6.4 oz (75 kg)   SpO2 99%   BMI 28.39 kg/m    Subjective:    Patient ID: , female    DOB: 1956/06/02, 65 y.o.   MRN: [id number]  HPI: Julie Wall is a 65 y.o. female  Chief Complaint  Patient presents with  . Annual Exam   Pt is here for general history and physical, p0ap and breast screening.  ASCUS in 2018 with negative HPV.  Chronic disease check done last week.  No f/u needed at this time.    Relevant past medical, surgical, family and social history reviewed and updated as indicated. Interim medical history since our last visit reviewed. Allergies and medications reviewed and updated.  Review of Systems  Per HPI unless specifically indicated above     Objective:    BP 124/72   Pulse 95   Temp 98 F (36.7 C) (Oral)   Resp 16   Ht 5\' 4"  (1.626 m)   Wt 165 lb 6.4 oz (75 kg)   SpO2 99%   BMI 28.39 kg/m   Wt Readings from Last 3 Encounters:  10/26/20 165 lb 6.4 oz (75 kg)  10/22/20 162 lb (73.5 kg)  06/24/20 162 lb 12.8 oz (73.8 kg)    Physical Exam Exam conducted with a chaperone present.  Constitutional:      General: She is not in acute distress.    Appearance: Normal appearance. She is well-developed.  HENT:     Head: Normocephalic and atraumatic.  Eyes:     General: Lids are normal. No scleral icterus.       Right eye: No discharge.        Left eye: No discharge.     Conjunctiva/sclera: Conjunctivae normal.  Cardiovascular:     Rate and Rhythm: Normal rate.  Pulmonary:     Effort: Pulmonary effort is normal.  Chest:  Breasts:     Right: Normal.     Left: Normal.    Abdominal:     Palpations: There is no hepatomegaly or splenomegaly.  Genitourinary:    Exam position: Lithotomy position.     Pubic Area: No rash or pubic lice.      Labia:        Right: No rash, tenderness, lesion or injury.        Left: No rash, tenderness, lesion  or injury.      Vagina: Normal.     Comments: Atrophic.  Unable to see cervix due to pain on examination.  Swab collected primarily for HPV evaluation Musculoskeletal:        General: Normal range of motion.  Skin:    Coloration: Skin is not pale.     Findings: No rash.  Neurological:     Mental Status: She is alert and oriented to person, place, and time.  Psychiatric:        Behavior: Behavior normal.        Thought Content: Thought content normal.        Judgment: Judgment normal.     Results for orders placed or performed in visit on 10/22/20  Microalbumin / creatinine urine ratio  Result Value Ref Range   Creatinine, Urine 56 20 - 275 mg/dL   Microalb, Ur 0.2 mg/dL   Microalb Creat Ratio 4 <30 mcg/mg creat  Lipid  panel  Result Value Ref Range   Cholesterol 92 <200 mg/dL   HDL 45 (L) > OR = 50 mg/dL   Triglycerides 46 <007 mg/dL   LDL Cholesterol (Calc) 34 mg/dL (calc)   Total CHOL/HDL Ratio 2.0 <5.0 (calc)   Non-HDL Cholesterol (Calc) 47 <622 mg/dL (calc)  CBC with Differential/Platelet  Result Value Ref Range   WBC 8.1 3.8 - 10.8 Thousand/uL   RBC 4.70 3.80 - 5.10 Million/uL   Hemoglobin 14.9 11.7 - 15.5 g/dL   HCT 63.3 35.4 - 56.2 %   MCV 92.8 80.0 - 100.0 fL   MCH 31.7 27.0 - 33.0 pg   MCHC 34.2 32.0 - 36.0 g/dL   RDW 56.3 89.3 - 73.4 %   Platelets 205 140 - 400 Thousand/uL   MPV 11.3 7.5 - 12.5 fL   Neutro Abs 4,876 1,500 - 7,800 cells/uL   Lymphs Abs 2,657 850 - 3,900 cells/uL   Absolute Monocytes 300 200 - 950 cells/uL   Eosinophils Absolute 251 15 - 500 cells/uL   Basophils Absolute 16 0 - 200 cells/uL   Neutrophils Relative % 60.2 %   Total Lymphocyte 32.8 %   Monocytes Relative 3.7 %   Eosinophils Relative 3.1 %   Basophils Relative 0.2 %  COMPLETE METABOLIC PANEL WITH GFR  Result Value Ref Range   Glucose, Bld 120 (H) 65 - 99 mg/dL   BUN 12 7 - 25 mg/dL   Creat 2.87 6.81 - 1.57 mg/dL   GFR, Est Non African American 97 > OR = 60 mL/min/1.45m2    GFR, Est African American 113 > OR = 60 mL/min/1.49m2   BUN/Creatinine Ratio NOT APPLICABLE 6 - 22 (calc)   Sodium 140 135 - 146 mmol/L   Potassium 4.1 3.5 - 5.3 mmol/L   Chloride 107 98 - 110 mmol/L   CO2 25 20 - 32 mmol/L   Calcium 9.4 8.6 - 10.4 mg/dL   Total Protein 6.7 6.1 - 8.1 g/dL   Albumin 3.7 3.6 - 5.1 g/dL   Globulin 3.0 1.9 - 3.7 g/dL (calc)   AG Ratio 1.2 1.0 - 2.5 (calc)   Total Bilirubin 0.3 0.2 - 1.2 mg/dL   Alkaline phosphatase (APISO) 92 37 - 153 U/L   AST 10 10 - 35 U/L   ALT 9 6 - 29 U/L  Thyroid Panel With TSH  Result Value Ref Range   T3 Uptake 34 22 - 35 %   T4, Total 7.3 5.1 - 11.9 mcg/dL   Free Thyroxine Index 2.5 1.4 - 3.8   TSH 0.94 0.40 - 4.50 mIU/L  VITAMIN D 25 Hydroxy (Vit-D Deficiency, Fractures)  Result Value Ref Range   Vit D, 25-Hydroxy 30 30 - 100 ng/mL  B12 and Folate Panel  Result Value Ref Range   Vitamin B-12 352 200 - 1,100 pg/mL   Folate 14.9 ng/mL  POCT HgB A1C  Result Value Ref Range   Hemoglobin A1C 6.8 (A) 4.0 - 5.6 %   HbA1c POC (<> result, manual entry)     HbA1c, POC (prediabetic range)     HbA1c, POC (controlled diabetic range)        Assessment & Plan:   Problem List Items Addressed This Visit      Unprioritized   Pap smear abnormality of cervix with ASCUS favoring benign    Pap, though inadequate collected.  Will check HPV.  Will monitor for f/u with results      Relevant Orders   Cytology -  PAP   Cervicovaginal ancillary only       Follow up plan: Return if symptoms worsen or fail to improve.

## 2020-10-28 ENCOUNTER — Other Ambulatory Visit: Payer: Self-pay

## 2020-10-28 MED ORDER — TRELEGY ELLIPTA 100-62.5-25 MCG/INH IN AEPB: 1.0000 | INHALATION_SPRAY | Freq: Every day | RESPIRATORY_TRACT | 11 refills | Status: AC

## 2020-10-29 LAB — CYTOLOGY - PAP
Adequacy: ABSENT
Comment: NEGATIVE
High risk HPV: NEGATIVE

## 2020-11-02 ENCOUNTER — Other Ambulatory Visit: Payer: Self-pay | Admitting: Unknown Physician Specialty

## 2020-11-02 DIAGNOSIS — R87620 Atypical squamous cells of undetermined significance on cytologic smear of vagina (ASC-US): Secondary | ICD-10-CM

## 2020-11-10 DIAGNOSIS — U071 COVID-19: Secondary | ICD-10-CM | POA: Diagnosis not present

## 2020-11-10 DIAGNOSIS — J9601 Acute respiratory failure with hypoxia: Secondary | ICD-10-CM | POA: Diagnosis not present

## 2020-11-10 DIAGNOSIS — R531 Weakness: Secondary | ICD-10-CM | POA: Diagnosis not present

## 2020-11-23 ENCOUNTER — Ambulatory Visit (INDEPENDENT_AMBULATORY_CARE_PROVIDER_SITE_OTHER): Payer: Medicare Other | Admitting: Obstetrics and Gynecology

## 2020-11-23 ENCOUNTER — Other Ambulatory Visit: Payer: Self-pay

## 2020-11-23 ENCOUNTER — Encounter: Payer: Self-pay | Admitting: Obstetrics and Gynecology

## 2020-11-23 VITALS — BP 130/74 | Ht 59.0 in | Wt 162.0 lb

## 2020-11-23 DIAGNOSIS — R87612 Low grade squamous intraepithelial lesion on cytologic smear of cervix (LGSIL): Secondary | ICD-10-CM | POA: Diagnosis not present

## 2020-11-23 NOTE — Progress Notes (Signed)
Patient ID: Julie Wall, female   DOB: 01/24/1956, 65 y.o.   MRN: [id number]  Reason for Consult: Procedure   Referred by Alba Cory, MD  Subjective:     HPI:  Julie Wall is a 65 y.o. female.  She was referred here today for an abnormal Pap smear.  She is not sexually active. She denies postmenopausal bleeding.   10/26/2020-low-grade squamous intraepithelial lesion with negative HPV 04/18/2017-atypical squamous cells of undetermined significance with negative HPV  Gynecological History  No LMP recorded. Patient is postmenopausal.  Past Medical History:  Diagnosis Date   Allergy    ASCUS favor benign 09/2013   negative HPV   Atopic dermatitis    Dermatologist at [facility]   Chronic low back pain    COPD (chronic obstructive pulmonary disease) (HCC)    Decreased exercise tolerance    Dental caries    Diabetes mellitus without complication (HCC)    GERD (gastroesophageal reflux disease)    Hyperlipidemia    Hypertension    Lumbosacral neuritis    Microalbuminuria    Osteopenia    Ovarian failure    Tobacco use    Family History  Problem Relation Age of Onset   Diabetes Mother    Hypertension Mother    Pancreatic cancer Mother    Alcohol abuse Father    Arthritis/Rheumatoid Sister    Diabetes Sister    Kidney disease Sister    Diabetes Brother    Breast cancer Neg Hx    Past Surgical History:  Procedure Laterality Date   CATARACT EXTRACTION Bilateral 1980   CESAREAN SECTION     CORNEAL TRANSPLANT Left    [facility]-Treatment for blindness.    Short Social History:  Social History   Tobacco Use   Smoking status: Former    Packs/day: 1.00    Years: 46.00    Pack years: 46.00    Types: Cigarettes    Start date: 01/07/1985    Quit date: 02/01/2020    Years since quitting: 0.8   Smokeless tobacco: Never  Substance Use Topics   Alcohol use: No    Alcohol/week: 0.0 standard drinks    Allergies  Allergen Reactions   Penicillins Rash    Current Outpatient  Medications  Medication Sig Dispense Refill   ACCU-CHEK AVIVA PLUS test strip CHECK BLOOD SUGAR ONCE DAILY 100 each 2   albuterol (VENTOLIN HFA) 108 (90 Base) MCG/ACT inhaler Inhale 2 puffs into the lungs every 6 (six) hours as needed for wheezing or shortness of breath. 18 g 0   Alcohol Swabs (ALCOHOL PREP) 70 % PADS CHECK BLOOD SUGAR 2 TIMES A DAY 200 each 3   AquaLance Lancets 30G MISC CHECK BLOOD SUGAR 2 TIMES A DAY 200 each 3   aspirin 81 MG chewable tablet Chew 1 tablet by mouth daily.     atorvastatin (LIPITOR) 40 MG tablet Take 1 tablet (40 mg total) by mouth daily. In place of simvastatin 90 tablet 1   baclofen (LIORESAL) 10 MG tablet Take 10 mg by mouth 3 (three) times daily.     Baclofen 5 MG TABS Take 1 tablet by mouth 2 (two) times daily as needed.     Cholecalciferol (VITAMIN D) 50 MCG (2000 UT) CAPS Take 1 capsule (2,000 Units total) by mouth daily. 100 capsule 1   Cyanocobalamin (B-12) 1000 MCG SUBL Place 1 tablet under the tongue daily. 100 tablet 1   dorzolamide-timolol (COSOPT) 22.3-6.8 MG/ML ophthalmic solution Place 1 drop into both eyes 2 (  two) times daily.      DULoxetine (CYMBALTA) 20 MG capsule Take 20 mg by mouth 2 (two) times daily.     empagliflozin (JARDIANCE) 25 MG TABS tablet Take 1 tablet (25 mg total) by mouth daily. 90 tablet 1   Fluticasone-Umeclidin-Vilant (TRELEGY ELLIPTA) 100-62.5-25 MCG/INH AEPB Inhale 1 puff into the lungs daily. 60 each 11   folic acid (FOLVITE) 1 MG tablet Take 1 mg by mouth daily.      gabapentin (NEURONTIN) 100 MG capsule Take 200 mg by mouth 3 (three) times daily.     HYDROcodone-acetaminophen (NORCO/VICODIN) 5-325 MG tablet Take 2 tablets by mouth 2 (two) times daily as needed for moderate pain. 30 tablet 0   Lancet Devices (SIMPLE DIAGNOSTICS LANCING DEV) MISC Check blood sugar once daily fasting, and up to 4 times daily PRN. 100 each 2   metFORMIN (GLUCOPHAGE-XR) 750 MG 24 hr tablet Take 1 tablet (750 mg total) by mouth daily with  breakfast. 90 tablet 1   methotrexate (RHEUMATREX) 2.5 MG tablet Take 15 mg by mouth every Friday.      POLYTRIM ophthalmic solution Place into the left eye.     prednisoLONE acetate (PRED FORTE) 1 % ophthalmic suspension      rOPINIRole (REQUIP XL) 4 MG 24 hr tablet Take 4 mg by mouth at bedtime.      triamcinolone ointment (KENALOG) 0.1 % Apply topically 2 (two) times daily. 30 g 0   VIGAMOX 0.5 % ophthalmic solution      No current facility-administered medications for this visit.    Review of Systems  Constitutional: Negative for chills, fatigue, fever and unexpected weight change.  HENT: Negative for trouble swallowing.  Eyes: Negative for loss of vision.  Respiratory: Negative for cough, shortness of breath and wheezing.  Cardiovascular: Negative for chest pain, leg swelling, palpitations and syncope.  GI: Negative for abdominal pain, blood in stool, diarrhea, nausea and vomiting.  GU: Negative for difficulty urinating, dysuria, frequency and hematuria.  Musculoskeletal: Negative for back pain, leg pain and joint pain.  Skin: Negative for rash.  Neurological: Negative for dizziness, headaches, light-headedness, numbness and seizures.  Psychiatric: Negative for behavioral problem, confusion, depressed mood and sleep disturbance.       Objective:  Objective   Vitals:   11/23/20 0935  BP: 130/74  Weight: 162 lb (73.5 kg)  Height: 4\' 11"  (1.499 m)   Body mass index is 32.72 kg/m.  Physical Exam Vitals and nursing note reviewed. Exam conducted with a chaperone present.  Constitutional:      Appearance: Normal appearance. She is well-developed.  HENT:     Head: Normocephalic and atraumatic.  Eyes:     Extraocular Movements: Extraocular movements intact.     Pupils: Pupils are equal, round, and reactive to light.  Cardiovascular:     Rate and Rhythm: Normal rate and regular rhythm.  Pulmonary:     Effort: Pulmonary effort is normal. No respiratory distress.     Breath  sounds: Normal breath sounds.  Abdominal:     General: Abdomen is flat.     Palpations: Abdomen is soft.  Genitourinary:    Comments: External: Normal appearing vulva. No lesions noted.  Speculum examination: Normal appearing cervix. No blood in the vaginal vault. No discharge.  Bimanual examination: Uterus midline, non-tender, normal in size, shape and contour.  No CMT. No adnexal masses. No adnexal tenderness. Pelvis not fixed.  Musculoskeletal:        General: No signs of injury.  Skin:    General: Skin is warm and dry.  Neurological:     Mental Status: She is alert and oriented to person, place, and time.  Psychiatric:        Behavior: Behavior normal.        Thought Content: Thought content normal.        Judgment: Judgment normal.    Assessment/Plan:     65 year old with abnormal Pap smears Colposcopy is not indicated based on 2019 ASCCP guidelines accessed through the ASCCP app. Repeat pap smear and HPV testing in 1 year. 5 year risk of CIN-3 plus lesions is 3.1%.  1 year follow-up with HPV testing is recommended. This can be accomplished with her PCP or with our office. Visual inspection of the cervix is normal.   More than 15 minutes were spent face to face with the patient in the room, reviewing the medical record, labs and images, and coordinating care for the patient. The plan of management was discussed in detail and counseling was provided.     Adelene Idler MD [facility], [facility] 11/23/2020 10:20 AM

## 2020-12-01 DIAGNOSIS — H168 Other keratitis: Secondary | ICD-10-CM | POA: Diagnosis not present

## 2020-12-03 DIAGNOSIS — H168 Other keratitis: Secondary | ICD-10-CM | POA: Diagnosis not present

## 2020-12-10 DIAGNOSIS — U071 COVID-19: Secondary | ICD-10-CM | POA: Diagnosis not present

## 2020-12-10 DIAGNOSIS — R531 Weakness: Secondary | ICD-10-CM | POA: Diagnosis not present

## 2020-12-10 DIAGNOSIS — J9601 Acute respiratory failure with hypoxia: Secondary | ICD-10-CM | POA: Diagnosis not present

## 2020-12-10 DIAGNOSIS — H40051 Ocular hypertension, right eye: Secondary | ICD-10-CM | POA: Diagnosis not present

## 2021-01-10 DIAGNOSIS — J9601 Acute respiratory failure with hypoxia: Secondary | ICD-10-CM | POA: Diagnosis not present

## 2021-01-10 DIAGNOSIS — R531 Weakness: Secondary | ICD-10-CM | POA: Diagnosis not present

## 2021-01-10 DIAGNOSIS — U071 COVID-19: Secondary | ICD-10-CM | POA: Diagnosis not present

## 2021-01-25 ENCOUNTER — Telehealth: Payer: Self-pay | Admitting: *Deleted

## 2021-01-25 DIAGNOSIS — M5417 Radiculopathy, lumbosacral region: Secondary | ICD-10-CM | POA: Diagnosis not present

## 2021-01-25 DIAGNOSIS — E538 Deficiency of other specified B group vitamins: Secondary | ICD-10-CM | POA: Diagnosis not present

## 2021-01-25 DIAGNOSIS — G2581 Restless legs syndrome: Secondary | ICD-10-CM | POA: Diagnosis not present

## 2021-01-25 DIAGNOSIS — E559 Vitamin D deficiency, unspecified: Secondary | ICD-10-CM | POA: Diagnosis not present

## 2021-01-25 NOTE — Chronic Care Management (AMB) (Signed)
  Care Management   Note  01/25/2021 Name: Jaquesha Boroff MRN: [id number] DOB: [birth year]  Presly Steinruck is a 65 y.o. year old female who is a primary care patient of Alba Cory, MD and is actively engaged with the care management team. I reached out to [facility] by phone today to assist with scheduling a follow up visit with the Pharmacist  Follow up plan: Unsuccessful telephone outreach attempt made. A HIPAA compliant phone message was left for the patient providing contact information and requesting a return call.   Burman Nieves, CCMA Care Guide, Embedded Care Coordination [facility]  Care Management  Direct Dial: [phone]

## 2021-01-27 NOTE — Chronic Care Management (AMB) (Signed)
  Care Management   Note  01/27/2021 Name: Julie Wall MRN: [id number] DOB: 06-Dec-1955  Julie Wall is a 65 y.o. year old female who is a primary care patient of Alba Cory, MD and is actively engaged with the care management team. I reached out to [facility] by phone today to assist with re-scheduling a follow up visit with the Pharmacist  Follow up plan: 2nd attempt Unsuccessful telephone outreach attempt made. A HIPAA compliant phone message was left for the patient providing contact information and requesting a return call.   Burman Nieves, CCMA Care Guide, Embedded Care Coordination [facility]  Care Management  Direct Dial: [phone]

## 2021-01-31 ENCOUNTER — Telehealth: Payer: Self-pay

## 2021-01-31 NOTE — Telephone Encounter (Signed)
Left voicemail to follow up and see if patient had a chance to get her bone density scan completed. Left voicemail with scheduling information and callback number.

## 2021-02-01 NOTE — Telephone Encounter (Signed)
Returned call and spoke to patient. Explained bone density, scheduling information given. Patient was pleasant and expressed she would be scheduling exam soon.

## 2021-02-01 NOTE — Telephone Encounter (Signed)
Pt returning call. She states that she is not sure what a bone density test is and is requesting to have nurse follow up. Please advise.

## 2021-02-03 NOTE — Chronic Care Management (AMB) (Signed)
  Chronic Care Management   Note  02/03/2021 Name: Devynn Hessler MRN: [id number] DOB: 17-Aug-1955  Lamika Connolly is a 65 y.o. year old female who is a primary care patient of Steele Sizer, MD. I reached out to [employer] by phone today in response to a referral sent by Ms. Rieley Barsky's  re establish with Pharm D      Ms. Stoiber was given information about Chronic Care Management services today including:  CCM service includes personalized support from designated clinical staff supervised by her physician, including individualized plan of care and coordination with other care providers 24/7 contact phone numbers for assistance for urgent and routine care needs. Service will only be billed when office clinical staff spend 20 minutes or more in a month to coordinate care. Only one practitioner may furnish and bill the service in a calendar month. The patient may stop CCM services at any time (effective at the end of the month) by phone call to the office staff. The patient will be responsible for cost sharing (co-pay) of up to 20% of the service fee (after annual deductible is met).  Patient agreed to services and verbal consent obtained.   Follow up plan: Telephone appointment with care management team member scheduled for: 03/02/2021  Julian Hy, [place] [facility]  Direct Dial: [phone]

## 2021-02-04 ENCOUNTER — Telehealth (INDEPENDENT_AMBULATORY_CARE_PROVIDER_SITE_OTHER): Payer: Medicare Other | Admitting: Nurse Practitioner

## 2021-02-04 DIAGNOSIS — R059 Cough, unspecified: Secondary | ICD-10-CM | POA: Diagnosis not present

## 2021-02-04 DIAGNOSIS — U071 COVID-19: Secondary | ICD-10-CM

## 2021-02-04 DIAGNOSIS — R0981 Nasal congestion: Secondary | ICD-10-CM | POA: Diagnosis not present

## 2021-02-04 DIAGNOSIS — J410 Simple chronic bronchitis: Secondary | ICD-10-CM

## 2021-02-04 MED ORDER — BENZONATATE 100 MG PO CAPS: 100.0000 mg | ORAL_CAPSULE | Freq: Two times a day (BID) | ORAL | 0 refills | Status: DC | PRN

## 2021-02-04 NOTE — Progress Notes (Signed)
Name: Julie Wall   MRN: [id number]    DOB: [birth year]   Date:02/04/2021       Progress Note  Subjective  Chief Complaint  COVID Positive  I connected with  Julie Wall  on 02/04/21 at  9:00 AM EDT by a phone telemedicine application and verified that I am speaking with the correct person using two identifiers.  I discussed the limitations of evaluation and management by telemedicine and the availability of in person appointments. The patient expressed understanding and agreed to proceed with the virtual visit  Staff also discussed with the patient that there may be a patient responsible charge related to this service. Patient Location: home Provider Location: [facility] Additional Individuals present: alone  HPI  Covid-19 / cough / nasal congestion : She says symptoms started on Tuesday.  [facility] be tested for Covid-19 and it was positive.  She reports her symptoms as minor.  She says she has a little cough.  Denies any shortness of breath, no wheezing, pulse ox at home 100%, no chest pain.  She has not used her albuterol inhaler at all.  She would like something for her cough.  She says she also has nasal congestion.  She says nasal drainage is clear.  She denies any change in bowel habits or appetite.  She reiterates that she is doing well.  She is taking vitamin D, C and Zinc already.  Discussed risks vs benefit of Paxlovid.  She declines that option.  Discussed concerning signs to watch for.  Will send in for tessalon pearls for cough.  Discussed OTC relief options, fluid intake and quarantine guidelines.   Chronic Bronchitis: She says she has not had any shortness of breath.  Home pulse ox reading is 100%.  She denies any chest pain.  She denies any dyspnea on exertion.  She has not used her albuterol inhaler.    Patient Active Problem List   Diagnosis Date Noted   Diabetic polyneuropathy associated with type 2 diabetes mellitus (HCC)    Weakness    Hilar  lymphadenopathy 02/05/2020   History of COVID-19 02/05/2020   Atherosclerosis of aorta (HCC) 05/04/2017   Pap smear abnormality of cervix with ASCUS favoring benign 04/23/2017   Vitamin D deficiency 12/14/2016   Obesity (BMI 30.0-34.9) 12/13/2015   Chronic bronchitis (HCC) 05/14/2015   Diabetes mellitus (HCC) 05/14/2015   Positive H. pylori test 04/05/2015   Rapid urease test for Helicobacter pylori infection positive 04/05/2015   Umbilical hernia 04/01/2015   Exomphalos 04/01/2015   RLS (restless legs syndrome) 01/08/2015   Type 2 diabetes mellitus with hyperlipidemia (HCC) 01/08/2015   Neurogenic claudication (HCC) 01/08/2015   AD (atopic dermatitis) 12/28/2014   Chronic LBP 12/28/2014   Dyslipidemia 12/28/2014   Gastroesophageal reflux disease without esophagitis 12/28/2014   Microalbuminuria 12/28/2014   Disorder of bone and cartilage 12/28/2014   Neuralgia of left thigh 12/28/2014   Perennial allergic rhinitis with seasonal variation 12/28/2014   Tobacco abuse 12/28/2014   Mononeuropathy of left lower extremity 12/28/2014   Hypothyroidism 09/22/2005   Hypertension, benign 09/22/2005    Past Surgical History:  Procedure Laterality Date   CATARACT EXTRACTION Bilateral 1980   CESAREAN SECTION     CORNEAL TRANSPLANT Left    [facility]-Treatment for blindness.    Family History  Problem Relation Age of Onset   Diabetes Mother    Hypertension Mother    Pancreatic cancer Mother    Alcohol abuse Father  Arthritis/Rheumatoid Sister    Diabetes Sister    Kidney disease Sister    Diabetes Brother    Breast cancer Neg Hx     Social History   Socioeconomic History   Marital status: Single    Spouse name: Not on file   Number of children: 3   Years of education: Not on file   Highest education level: High school graduate  Occupational History   Not on file  Tobacco Use   Smoking status: Former    Packs/day: 1.00    Years: 46.00    Pack years: 46.00    Types:  Cigarettes    Start date: 01/07/1985    Quit date: 02/01/2020    Years since quitting: 1.0   Smokeless tobacco: Never  Vaping Use   Vaping Use: Never used  Substance and Sexual Activity   Alcohol use: No    Alcohol/week: 0.0 standard drinks   Drug use: No   Sexual activity: Not Currently  Other Topics Concern   Not on file  Social History Narrative   Disabled from severe atopic dermatitis   Has 3 children   Lives alone but very connect with family ( sees sister and her children daily)   Also belongs to a church   Social Determinants of Corporate investment bankerHealth   Financial Resource Strain: Low Risk    Difficulty of Paying Living Expenses: Not hard at all  Food Insecurity: No Food Insecurity   Worried About Programme researcher, broadcasting/film/videounning Out of Food in the Last Year: Never true   Baristaan Out of Food in the Last Year: Never true  Transportation Needs: No Transportation Needs   Lack of Transportation (Medical): No   Lack of Transportation (Non-Medical): No  Physical Activity: Sufficiently Active   Days of Exercise per Week: 4 days   Minutes of Exercise per Session: 40 min  Stress: No Stress Concern Present   Feeling of Stress : Only a little  Social Connections: Moderately Isolated   Frequency of Communication with Friends and Family: Once a week   Frequency of Social Gatherings with Friends and Family: Once a week   Attends Religious Services: More than 4 times per year   Active Member of [employer] Financial[employer]ubs or Organizations: Yes   Attends Engineer, structuralClub or Organization Meetings: More than 4 times per year   Marital Status: Never married  Catering managerntimate Partner Violence: Not At Risk   Fear of Current or Ex-Partner: No   Emotionally Abused: No   Physically Abused: No   Sexually Abused: No     Current Outpatient Medications:    ACCU-CHEK AVIVA PLUS test strip, CHECK BLOOD SUGAR ONCE DAILY, Disp: 100 each, Rfl: 2   albuterol (VENTOLIN HFA) 108 (90 Base) MCG/ACT inhaler, Inhale 2 puffs into the lungs every 6 (six) hours as needed for wheezing or  shortness of breath., Disp: 18 g, Rfl: 0   Alcohol Swabs (ALCOHOL PREP) 70 % PADS, CHECK BLOOD SUGAR 2 TIMES A DAY, Disp: 200 each, Rfl: 3   AquaLance Lancets 30G MISC, CHECK BLOOD SUGAR 2 TIMES A DAY, Disp: 200 each, Rfl: 3   aspirin 81 MG chewable tablet, Chew 1 tablet by mouth daily., Disp: , Rfl:    atorvastatin (LIPITOR) 40 MG tablet, Take 1 tablet (40 mg total) by mouth daily. In place of simvastatin, Disp: 90 tablet, Rfl: 1   baclofen (LIORESAL) 10 MG tablet, Take 10 mg by mouth 3 (three) times daily., Disp: , Rfl:    Baclofen 5 MG TABS, Take 1 tablet  by mouth 2 (two) times daily as needed., Disp: , Rfl:    Cholecalciferol (VITAMIN D) 50 MCG (2000 UT) CAPS, Take 1 capsule (2,000 Units total) by mouth daily., Disp: 100 capsule, Rfl: 1   Cyanocobalamin (B-12) 1000 MCG SUBL, Place 1 tablet under the tongue daily., Disp: 100 tablet, Rfl: 1   dorzolamide-timolol (COSOPT) 22.3-6.8 MG/ML ophthalmic solution, Place 1 drop into both eyes 2 (two) times daily. , Disp: , Rfl:    DULoxetine (CYMBALTA) 20 MG capsule, Take 20 mg by mouth 2 (two) times daily., Disp: , Rfl:    empagliflozin (JARDIANCE) 25 MG TABS tablet, Take 1 tablet (25 mg total) by mouth daily., Disp: 90 tablet, Rfl: 1   folic acid (FOLVITE) 1 MG tablet, Take 1 mg by mouth daily. , Disp: , Rfl:    gabapentin (NEURONTIN) 100 MG capsule, Take 200 mg by mouth 3 (three) times daily., Disp: , Rfl:    HYDROcodone-acetaminophen (NORCO/VICODIN) 5-325 MG tablet, Take 2 tablets by mouth 2 (two) times daily as needed for moderate pain., Disp: 30 tablet, Rfl: 0   Lancet Devices (SIMPLE DIAGNOSTICS LANCING DEV) MISC, Check blood sugar once daily fasting, and up to 4 times daily PRN., Disp: 100 each, Rfl: 2   metFORMIN (GLUCOPHAGE-XR) 750 MG 24 hr tablet, Take 1 tablet (750 mg total) by mouth daily with breakfast., Disp: 90 tablet, Rfl: 1   methotrexate (RHEUMATREX) 2.5 MG tablet, Take 15 mg by mouth every Friday. , Disp: , Rfl:    POLYTRIM ophthalmic  solution, Place into the left eye., Disp: , Rfl:    prednisoLONE acetate (PRED FORTE) 1 % ophthalmic suspension, , Disp: , Rfl:    rOPINIRole (REQUIP XL) 4 MG 24 hr tablet, Take 4 mg by mouth at bedtime. , Disp: , Rfl:    triamcinolone ointment (KENALOG) 0.1 %, Apply topically 2 (two) times daily., Disp: 30 g, Rfl: 0   VIGAMOX 0.5 % ophthalmic solution, , Disp: , Rfl:   Allergies  Allergen Reactions   Penicillins Rash    I personally reviewed active problem list, medication list, allergies, family history, social history, health maintenance with the patient/caregiver today.   ROS Constitutional: Negative for fever or weight change.  Respiratory: Positive for cough, negative for shortness of breath.   Cardiovascular: Negative for chest pain or palpitations.  Gastrointestinal: Negative for abdominal pain, no bowel changes.  Musculoskeletal: Negative for gait problem or joint swelling.  Skin: Negative for rash.  Neurological: Negative for dizziness or headache.  No other specific complaints in a complete review of systems (except as listed in HPI above).   Objective  Virtual encounter, vitals not obtained.  There is no height or weight on file to calculate BMI.    Physical exam   Awake, alert and oriented.   PHQ2/9: Depression screen [facility] 2/9 02/04/2021 10/26/2020 10/22/2020 09/29/2020 07/01/2020  Decreased Interest 0 0 0 0 0  Down, Depressed, Hopeless 0 0 0 0 0  PHQ - 2 Score 0 0 0 0 0  Altered sleeping - 0 - - -  Tired, decreased energy - 0 - - -  Change in appetite - 0 - - -  Feeling bad or failure about yourself  - 0 - - -  Trouble concentrating - 0 - - -  Moving slowly or fidgety/restless - 0 - - -  Suicidal thoughts - 0 - - -  PHQ-9 Score - 0 - - -  Difficult doing work/chores - Not difficult at all - - -  Some recent data might be hidden   PHQ-2/9 Result is negative.    Fall Risk: Fall Risk  02/04/2021 10/26/2020 10/22/2020 09/29/2020 07/01/2020  Falls in the past year?  0 0 0 0 0  Number falls in past yr: 0 0 0 0 0  Injury with Fall? 0 0 0 0 0  Comment - - - - -  Risk Factor Category  - - - - -  Risk for fall due to : No Fall Risks - - - No Fall Risks  Risk for fall due to: Comment - - - - -  Follow up Falls prevention discussed Falls evaluation completed - - Falls prevention discussed     Assessment & Plan  1. COVID-19 -Discussed concerning signs to watch for.  Will send in for tessalon pearls for cough.  Discussed OTC relief options, fluid intake and quarantine guidelines.  2. Cough  - benzonatate (TESSALON) 100 MG capsule; Take 1 capsule (100 mg total) by mouth 2 (two) times daily as needed for cough.  Dispense: 20 capsule; Refill: 0  3. Nasal congestion - discussed OTC options, saline nasal wash and fluid intake.   4. Simple chronic bronchitis (HCC)  - continue to monitor pulse ox reading   I discussed the assessment and treatment plan with the patient. The patient was provided an opportunity to ask questions and all were answered. The patient agreed with the plan and demonstrated an understanding of the instructions.  The patient was advised to call back or seek an in-person evaluation if the symptoms worsen or if the condition fails to improve as anticipated.  I provided 25  minutes of non-face-to-face time during this encounter.

## 2021-02-14 ENCOUNTER — Other Ambulatory Visit: Payer: Self-pay | Admitting: Family Medicine

## 2021-02-14 DIAGNOSIS — R809 Proteinuria, unspecified: Secondary | ICD-10-CM

## 2021-02-14 DIAGNOSIS — E1129 Type 2 diabetes mellitus with other diabetic kidney complication: Secondary | ICD-10-CM

## 2021-02-14 DIAGNOSIS — E785 Hyperlipidemia, unspecified: Secondary | ICD-10-CM

## 2021-02-14 DIAGNOSIS — E1142 Type 2 diabetes mellitus with diabetic polyneuropathy: Secondary | ICD-10-CM

## 2021-02-22 ENCOUNTER — Encounter: Payer: Self-pay | Admitting: Family Medicine

## 2021-02-22 ENCOUNTER — Ambulatory Visit (INDEPENDENT_AMBULATORY_CARE_PROVIDER_SITE_OTHER): Payer: Medicare Other | Admitting: Family Medicine

## 2021-02-22 VITALS — BP 118/74 | HR 91 | Temp 98.2°F | Resp 16 | Ht 62.0 in | Wt 161.7 lb

## 2021-02-22 DIAGNOSIS — E559 Vitamin D deficiency, unspecified: Secondary | ICD-10-CM | POA: Diagnosis not present

## 2021-02-22 DIAGNOSIS — G2581 Restless legs syndrome: Secondary | ICD-10-CM

## 2021-02-22 DIAGNOSIS — G9519 Other vascular myelopathies: Secondary | ICD-10-CM

## 2021-02-22 DIAGNOSIS — G8929 Other chronic pain: Secondary | ICD-10-CM | POA: Diagnosis not present

## 2021-02-22 DIAGNOSIS — M545 Low back pain, unspecified: Secondary | ICD-10-CM | POA: Diagnosis not present

## 2021-02-22 DIAGNOSIS — Z23 Encounter for immunization: Secondary | ICD-10-CM | POA: Diagnosis not present

## 2021-02-22 DIAGNOSIS — E1142 Type 2 diabetes mellitus with diabetic polyneuropathy: Secondary | ICD-10-CM

## 2021-02-22 DIAGNOSIS — E1129 Type 2 diabetes mellitus with other diabetic kidney complication: Secondary | ICD-10-CM | POA: Diagnosis not present

## 2021-02-22 DIAGNOSIS — J410 Simple chronic bronchitis: Secondary | ICD-10-CM

## 2021-02-22 DIAGNOSIS — R29818 Other symptoms and signs involving the nervous system: Secondary | ICD-10-CM

## 2021-02-22 DIAGNOSIS — I700 Atherosclerosis of aorta: Secondary | ICD-10-CM | POA: Diagnosis not present

## 2021-02-22 DIAGNOSIS — E538 Deficiency of other specified B group vitamins: Secondary | ICD-10-CM

## 2021-02-22 LAB — POCT GLYCOSYLATED HEMOGLOBIN (HGB A1C): Hemoglobin A1C: 7.6 % — AB (ref 4.0–5.6)

## 2021-02-22 MED ORDER — TRELEGY ELLIPTA 100-62.5-25 MCG/INH IN AEPB: 1.0000 | INHALATION_SPRAY | Freq: Every day | RESPIRATORY_TRACT | 5 refills | Status: DC

## 2021-02-22 MED ORDER — HYDROCODONE-ACETAMINOPHEN 5-325 MG PO TABS: 2.0000 | ORAL_TABLET | Freq: Two times a day (BID) | ORAL | 0 refills | Status: DC | PRN

## 2021-02-22 NOTE — Progress Notes (Signed)
Name: Julie Wall   MRN: [id number]    DOB: 10-Apr-1956   Date:02/22/2021       Progress Note  Subjective  Chief Complaint  Follow Up  I connected with  Forrest Summerson on 02/22/21 at 11:40 AM EDT by telephone and verified that I am speaking with the correct person using two identifiers.  I discussed the limitations, risks, security and privacy concerns of performing an evaluation and management service by telephone and the availability of in person appointments. Staff also discussed with the patient that there may be a patient responsible charge related to this service. Patient agreed on having a virtual visit  Patient Location: in her car right now - [facility] parking lot  Provider Location: home  Additional Individuals present: alone   HPI  Diabetes follow up: A1C went up from 6.8 % to 7.6 %, she states her diet has not been good lately, she states eating more sweets since her sister died recently. She states glucose at home around 120's,  but sometimes going to 150's  .She also has associated obesity, dyslipidemia,  and microalbuminuria. She is on Jardiance, Taking metformin two in am. We stopped Losartan on her last visit since her bp had been too low. Today bp was 118/74 .    Eczema: she sees dermatologist at [facility], last visit was with Dr. Dub Mikes- Solominos. She is taking Methotrexate 15 mg weekly . Eczema is good, but having severe hair loss/breaking    HTN: we stopped losartan and bp still towards low end of normal but no longer having dizziness  Hyperlipidemia/Atherosclerosis of aorta : she is back  Atorvastatin and aspirin daily , last LDL 34 -at goal, no side effects of medications    COPD/Intesticial lung disease on CT chest : she quit smoking 12/03/2016   but resumed smoking Fall 2019 l, she quit again when she developed COVID-19 in 2021  . She is taking Anoro. She states since pneumonia COVID-19 she has recovered well, no longer using oxygen She denies cough or sob, she is now taking  Trelegy and needs a refill. Advised to contact Dr. Tonie Griffith    Back pain: . She states right leg is always painful 9/10 when she is very activity  and takes hydrocodone prn  She denies  leg weakness, no bowel or bladder incontinence, she has numbness on left lateral thigh.  She has been seeing Dr. Sherryll Burger who ordered MRI and results below, she was advised to see neurosurgeon and has steroid injections that improved symptoms but now is getting referred to PT . I give her hydrocodone to take prn only    IMPRESSION/MRI thoracic spine 09/04/2019    Diffuse degenerative disc narrowing and bulging with small protrusions. Dominant findings at T11-12 where there is cord impingement with flattening and mild T2 hyperintensity by prior lumbar MRI.   IMPRESSION:08/03/2019   1. Moderate spinal stenosis L3-4 unchanged. Moderate subarticular stenosis bilaterally. 2. Severe spinal stenosis with progression since the prior study. Severe subarticular and foraminal stenosis on the left. 3. Moderate subarticular stenosis bilaterally L5-S1. 4. Progressive degenerative changes at T11-12 with significant spinal stenosis and mild cord signal abnormality. Thoracic MRI with dedicated axial images through this level may be helpful.   RLS: She sees neurologist, Dr. Fara Boros,  and is on higher dose of Requip      Patient Active Problem List   Diagnosis Date Noted   Diabetic polyneuropathy associated with type 2 diabetes mellitus (HCC)    Weakness  Hilar lymphadenopathy 02/05/2020   History of COVID-19 02/05/2020   Atherosclerosis of aorta (HCC) 05/04/2017   Pap smear abnormality of cervix with ASCUS favoring benign 04/23/2017   Vitamin D deficiency 12/14/2016   Obesity (BMI 30.0-34.9) 12/13/2015   Chronic bronchitis (HCC) 05/14/2015   Diabetes mellitus (HCC) 05/14/2015   Positive H. pylori test 04/05/2015   Rapid urease test for Helicobacter pylori infection positive 04/05/2015   Umbilical hernia 04/01/2015    Exomphalos 04/01/2015   RLS (restless legs syndrome) 01/08/2015   Type 2 diabetes mellitus with hyperlipidemia (HCC) 01/08/2015   Neurogenic claudication (HCC) 01/08/2015   AD (atopic dermatitis) 12/28/2014   Chronic LBP 12/28/2014   Dyslipidemia 12/28/2014   Gastroesophageal reflux disease without esophagitis 12/28/2014   Microalbuminuria 12/28/2014   Disorder of bone and cartilage 12/28/2014   Neuralgia of left thigh 12/28/2014   Perennial allergic rhinitis with seasonal variation 12/28/2014   Tobacco abuse 12/28/2014   Mononeuropathy of left lower extremity 12/28/2014   Hypothyroidism 09/22/2005   Hypertension, benign 09/22/2005    Past Surgical History:  Procedure Laterality Date   CATARACT EXTRACTION Bilateral 1980   CESAREAN SECTION     CORNEAL TRANSPLANT Left    [facility]-Treatment for blindness.    Family History  Problem Relation Age of Onset   Diabetes Mother    Hypertension Mother    Pancreatic cancer Mother    Alcohol abuse Father    Arthritis/Rheumatoid Sister    Diabetes Sister    Kidney disease Sister    Diabetes Brother    Breast cancer Neg Hx     Social History   Socioeconomic History   Marital status: Single    Spouse name: Not on file   Number of children: 3   Years of education: Not on file   Highest education level: High school graduate  Occupational History   Not on file  Tobacco Use   Smoking status: Former    Packs/day: 1.00    Years: 46.00    Pack years: 46.00    Types: Cigarettes    Start date: 01/07/1985    Quit date: 02/01/2020    Years since quitting: 1.0   Smokeless tobacco: Never  Vaping Use   Vaping Use: Never used  Substance and Sexual Activity   Alcohol use: No    Alcohol/week: 0.0 standard drinks   Drug use: No   Sexual activity: Not Currently  Other Topics Concern   Not on file  Social History Narrative   Disabled from severe atopic dermatitis   Has 3 children   Lives alone but very connect with family ( sees sister  and her children daily)   Also belongs to a church   Social Determinants of Corporate investment banker Strain: Low Risk    Difficulty of Paying Living Expenses: Not hard at all  Food Insecurity: No Food Insecurity   Worried About Programme researcher, broadcasting/film/video in the Last Year: Never true   Barista in the Last Year: Never true  Transportation Needs: No Transportation Needs   Lack of Transportation (Medical): No   Lack of Transportation (Non-Medical): No  Physical Activity: Sufficiently Active   Days of Exercise per Week: 4 days   Minutes of Exercise per Session: 40 min  Stress: No Stress Concern Present   Feeling of Stress : Only a little  Social Connections: Moderately Isolated   Frequency of Communication with Friends and Family: Once a week   Frequency of Social Gatherings with  Friends and Family: Once a week   Attends Religious Services: More than 4 times per year   Active Member of Clubs or Organizations: Yes   Attends Engineer, structural: More than 4 times per year   Marital Status: Never married  Catering manager Violence: Not At Risk   Fear of Current or Ex-Partner: No   Emotionally Abused: No   Physically Abused: No   Sexually Abused: No     Current Outpatient Medications:    ACCU-CHEK AVIVA PLUS test strip, CHECK BLOOD SUGAR ONCE DAILY, Disp: 100 each, Rfl: 2   albuterol (VENTOLIN HFA) 108 (90 Base) MCG/ACT inhaler, Inhale 2 puffs into the lungs every 6 (six) hours as needed for wheezing or shortness of breath., Disp: 18 g, Rfl: 0   Alcohol Swabs (ALCOHOL PREP) 70 % PADS, CHECK BLOOD SUGAR 2 TIMES A DAY, Disp: 200 each, Rfl: 3   AquaLance Lancets 30G MISC, CHECK BLOOD SUGAR 2 TIMES A DAY, Disp: 200 each, Rfl: 3   aspirin 81 MG chewable tablet, Chew 1 tablet by mouth daily., Disp: , Rfl:    atorvastatin (LIPITOR) 40 MG tablet, TAKE 1 TABLET BY MOUTH DAILY (IN PLACE OF SIMVASTATIN), Disp: 90 tablet, Rfl: 1   baclofen (LIORESAL) 10 MG tablet, Take 10 mg by mouth  3 (three) times daily., Disp: , Rfl:    Baclofen 5 MG TABS, Take 1 tablet by mouth 2 (two) times daily as needed., Disp: , Rfl:    benzonatate (TESSALON) 100 MG capsule, Take 1 capsule (100 mg total) by mouth 2 (two) times daily as needed for cough., Disp: 20 capsule, Rfl: 0   Cholecalciferol (VITAMIN D) 50 MCG (2000 UT) CAPS, Take 1 capsule (2,000 Units total) by mouth daily., Disp: 100 capsule, Rfl: 1   Cyanocobalamin (B-12) 1000 MCG SUBL, Place 1 tablet under the tongue daily., Disp: 100 tablet, Rfl: 1   dorzolamide-timolol (COSOPT) 22.3-6.8 MG/ML ophthalmic solution, Place 1 drop into both eyes 2 (two) times daily. , Disp: , Rfl:    DULoxetine (CYMBALTA) 20 MG capsule, Take 20 mg by mouth 2 (two) times daily., Disp: , Rfl:    folic acid (FOLVITE) 1 MG tablet, Take 1 mg by mouth daily. , Disp: , Rfl:    gabapentin (NEURONTIN) 100 MG capsule, Take 200 mg by mouth 3 (three) times daily., Disp: , Rfl:    gabapentin (NEURONTIN) 300 MG capsule, Take by mouth., Disp: , Rfl:    HYDROcodone-acetaminophen (NORCO/VICODIN) 5-325 MG tablet, Take 2 tablets by mouth 2 (two) times daily as needed for moderate pain., Disp: 30 tablet, Rfl: 0   JARDIANCE 25 MG TABS tablet, TAKE 1 TABLET BY MOUTH DAILY, Disp: 90 tablet, Rfl: 1   Lancet Devices (SIMPLE DIAGNOSTICS LANCING DEV) MISC, Check blood sugar once daily fasting, and up to 4 times daily PRN., Disp: 100 each, Rfl: 2   losartan (COZAAR) 25 MG tablet, Take 12.5 mg by mouth daily., Disp: , Rfl:    metFORMIN (GLUCOPHAGE-XR) 750 MG 24 hr tablet, TAKE 1 TABLET BY MOUTH DAILY WITH BREAKFAST, Disp: 90 tablet, Rfl: 1   methotrexate (RHEUMATREX) 2.5 MG tablet, Take 15 mg by mouth every Friday. , Disp: , Rfl:    POLYTRIM ophthalmic solution, Place into the left eye., Disp: , Rfl:    prednisoLONE acetate (PRED FORTE) 1 % ophthalmic suspension, , Disp: , Rfl:    rOPINIRole (REQUIP XL) 4 MG 24 hr tablet, Take 4 mg by mouth at bedtime. , Disp: , Rfl:  triamcinolone  ointment (KENALOG) 0.1 %, Apply topically 2 (two) times daily., Disp: 30 g, Rfl: 0   VIGAMOX 0.5 % ophthalmic solution, , Disp: , Rfl:   Allergies  Allergen Reactions   Penicillins Rash    I personally reviewed active problem list, medication list, allergies, family history, social history, health maintenance with the patient/caregiver today.   ROS  Ten systems reviewed and is negative except as mentioned in HPI   Objective  Today's Vitals   02/22/21 1026  BP: 118/74  Pulse: 91  Resp: 16  Temp: 98.2 F (36.8 C)  TempSrc: Oral  SpO2: 95%  Weight: 161 lb 11.2 oz (73.3 kg)  Height: 5\' 2"  (1.575 m)  PainSc: 0-No pain   Body mass index is 29.58 kg/m.   There is no height or weight on file to calculate BMI.  Physical Exam  Awake, alert and oriented  PHQ2/9: Depression screen [facility] 2/9 02/04/2021 10/26/2020 10/22/2020 09/29/2020 07/01/2020  Decreased Interest 0 0 0 0 0  Down, Depressed, Hopeless 0 0 0 0 0  PHQ - 2 Score 0 0 0 0 0  Altered sleeping - 0 - - -  Tired, decreased energy - 0 - - -  Change in appetite - 0 - - -  Feeling bad or failure about yourself  - 0 - - -  Trouble concentrating - 0 - - -  Moving slowly or fidgety/restless - 0 - - -  Suicidal thoughts - 0 - - -  PHQ-9 Score - 0 - - -  Difficult doing work/chores - Not difficult at all - - -  Some recent data might be hidden   PHQ-2/9 Result is negative.    Fall Risk: Fall Risk  02/04/2021 10/26/2020 10/22/2020 09/29/2020 07/01/2020  Falls in the past year? 0 0 0 0 0  Number falls in past yr: 0 0 0 0 0  Injury with Fall? 0 0 0 0 0  Comment - - - - -  Risk Factor Category  - - - - -  Risk for fall due to : No Fall Risks - - - No Fall Risks  Risk for fall due to: Comment - - - - -  Follow up Falls prevention discussed Falls evaluation completed - - Falls prevention discussed     Assessment & Plan  1. Need for immunization against influenza  - Flu vaccine HIGH DOSE PF (Fluzone High dose)  2. Type 2  diabetes mellitus with other diabetic kidney complication (HCC)  - POCT HgB A1C  3. Neurogenic claudication (HCC)  - HYDROcodone-acetaminophen (NORCO/VICODIN) 5-325 MG tablet; Take 2 tablets by mouth 2 (two) times daily as needed for moderate pain.  Dispense: 30 tablet; Refill: 0  4. Chronic bilateral low back pain without sciatica  - HYDROcodone-acetaminophen (NORCO/VICODIN) 5-325 MG tablet; Take 2 tablets by mouth 2 (two) times daily as needed for moderate pain.  Dispense: 30 tablet; Refill: 0  5. Simple chronic bronchitis (HCC)  - Fluticasone-Umeclidin-Vilant (TRELEGY ELLIPTA) 100-62.5-25 MCG/INH AEPB; Inhale 1 puff into the lungs daily.  Dispense: 1 each; Refill: 5  6. Vitamin D deficiency   7. B12 deficiency   8. DM type 2 with diabetic peripheral neuropathy (HCC)   9. Atherosclerosis of aorta (HCC)  On statin therapy   10. RLS (restless legs syndrome)  Improved with higher dose of REquip    I discussed the assessment and treatment plan with the patient. The patient was provided an opportunity to ask questions and all were  answered. The patient agreed with the plan and demonstrated an understanding of the instructions.   The patient was advised to call back or seek an in-person evaluation if the symptoms worsen or if the condition fails to improve as anticipated.  I provided 25 minutes of non-face-to-face time during this encounter.  Alba Cory, MD

## 2021-02-23 DIAGNOSIS — M4727 Other spondylosis with radiculopathy, lumbosacral region: Secondary | ICD-10-CM | POA: Diagnosis not present

## 2021-02-24 DIAGNOSIS — M48062 Spinal stenosis, lumbar region with neurogenic claudication: Secondary | ICD-10-CM | POA: Diagnosis not present

## 2021-02-24 DIAGNOSIS — M4714 Other spondylosis with myelopathy, thoracic region: Secondary | ICD-10-CM | POA: Diagnosis not present

## 2021-02-25 DIAGNOSIS — M4727 Other spondylosis with radiculopathy, lumbosacral region: Secondary | ICD-10-CM | POA: Diagnosis not present

## 2021-03-01 ENCOUNTER — Telehealth: Payer: Self-pay

## 2021-03-01 NOTE — Progress Notes (Signed)
Chronic Care Management Pharmacy Assistant   Name: Julie Wall  MRN: [id number] DOB: Nov 22, 1955  Initial visit to Re-Establish with Angelena Sole, CPP on 03/02/2021 @ 1300  Reached out to patient to remind her about the visit tomorrow with CPP and to complete the initial assessment, but her home number just rang and no one picked up, and her cell phone I had to LVM.   Conditions to be addressed/monitored: HTN, DMII, and Atherosclerosis of Aorta, Chronic Bronchitis, Diabetic Polyneuropathy associated with Type II DM, Mononeuropathy of left lower extremity, Chronic LBP, Dyslipidemia, Neurogenic claudication, Vitamin D Deficiency,    Recent office visits:  02/22/2021 Alba Cory, MD (PCP Office Visit) for Follow-up- Started Fluticasone 1 puff daily; discontined Benzonatate 100 mg course completed; A1C ordered; patient instructed to follow-up in 4 months.   02/04/2021 Della Goo, FNP (PCP Video Visit) for COVID Positive- Started Benzonatate 100 mg twice daily  10/26/2020 Gabriel Cirri, NP (PCP office Visit) for Annual Exam- No medication changes noted  10/22/2020 Alba Cory, MD (PCP Office Visit) for Follow-up- Diazepam 5 mg discontinued; Losartan Potassium 25 mg Discontinued; labs ordered,   Recent consult visits:  09/09/2020  Hemang Wallis Mart, MD (Neurology) Unable to see note  Hospital visits:  None in previous 6 months  Medications: Outpatient Encounter Medications as of 03/01/2021  Medication Sig   ACCU-CHEK AVIVA PLUS test strip CHECK BLOOD SUGAR ONCE DAILY   albuterol (VENTOLIN HFA) 108 (90 Base) MCG/ACT inhaler Inhale 2 puffs into the lungs every 6 (six) hours as needed for wheezing or shortness of breath.   Alcohol Swabs (ALCOHOL PREP) 70 % PADS CHECK BLOOD SUGAR 2 TIMES A DAY   AquaLance Lancets 30G MISC CHECK BLOOD SUGAR 2 TIMES A DAY   aspirin 81 MG chewable tablet Chew 1 tablet by mouth daily.   atorvastatin (LIPITOR) 40 MG tablet TAKE 1 TABLET BY MOUTH  DAILY (IN PLACE OF SIMVASTATIN)   baclofen (LIORESAL) 10 MG tablet Take 10 mg by mouth 3 (three) times daily.   Baclofen 5 MG TABS Take 1 tablet by mouth 2 (two) times daily as needed.   Cholecalciferol (VITAMIN D) 50 MCG (2000 UT) CAPS Take 1 capsule (2,000 Units total) by mouth daily.   Cyanocobalamin (B-12) 1000 MCG SUBL Place 1 tablet under the tongue daily.   dorzolamide-timolol (COSOPT) 22.3-6.8 MG/ML ophthalmic solution Place 1 drop into both eyes 2 (two) times daily.    DULoxetine (CYMBALTA) 20 MG capsule Take 20 mg by mouth 2 (two) times daily.   Fluticasone-Umeclidin-Vilant (TRELEGY ELLIPTA) 100-62.5-25 MCG/INH AEPB Inhale 1 puff into the lungs daily.   folic acid (FOLVITE) 1 MG tablet Take 1 mg by mouth daily.    gabapentin (NEURONTIN) 100 MG capsule Take 200 mg by mouth 3 (three) times daily.   gabapentin (NEURONTIN) 300 MG capsule Take by mouth.   HYDROcodone-acetaminophen (NORCO/VICODIN) 5-325 MG tablet Take 2 tablets by mouth 2 (two) times daily as needed for moderate pain.   JARDIANCE 25 MG TABS tablet TAKE 1 TABLET BY MOUTH DAILY   Lancet Devices (SIMPLE DIAGNOSTICS LANCING DEV) MISC Check blood sugar once daily fasting, and up to 4 times daily PRN.   metFORMIN (GLUCOPHAGE-XR) 750 MG 24 hr tablet TAKE 1 TABLET BY MOUTH DAILY WITH BREAKFAST   methotrexate (RHEUMATREX) 2.5 MG tablet Take 15 mg by mouth every Friday.    POLYTRIM ophthalmic solution Place into the left eye.   prednisoLONE acetate (PRED FORTE) 1 % ophthalmic suspension    rOPINIRole (REQUIP  XL) 4 MG 24 hr tablet Take 4 mg by mouth at bedtime.    triamcinolone ointment (KENALOG) 0.1 % Apply topically 2 (two) times daily.   VIGAMOX 0.5 % ophthalmic solution    No facility-administered encounter medications on file as of 03/01/2021.   Care Gaps: None ID  Star Rating Drugs: Jardiance 25 mg last filled on 02/14/2021 for a 90-Day supply with [facility] Metformin 750 mg last filled on 02/14/2021 for a  90-Day supply with [facility] Atorvastatin 40 mg last filled on 02/14/2021 for a 90-Day supply with [facility]    Please bring medications and supplements to appointment  Adelene Idler, CPA/CMA Clinical Pharmacist Assistant Phone: [phone]

## 2021-03-02 ENCOUNTER — Ambulatory Visit (INDEPENDENT_AMBULATORY_CARE_PROVIDER_SITE_OTHER): Payer: Medicare Other

## 2021-03-02 DIAGNOSIS — J410 Simple chronic bronchitis: Secondary | ICD-10-CM

## 2021-03-02 DIAGNOSIS — E1169 Type 2 diabetes mellitus with other specified complication: Secondary | ICD-10-CM

## 2021-03-02 DIAGNOSIS — M4727 Other spondylosis with radiculopathy, lumbosacral region: Secondary | ICD-10-CM | POA: Diagnosis not present

## 2021-03-02 NOTE — Patient Instructions (Signed)
Visit Information It was great speaking with you today!  Please let me know if you have any questions about our visit.   Goals Addressed             This Visit's Progress    Monitor and Manage My Blood Sugar-Diabetes Type 2       Timeframe:  [place] Goal Priority:  High Start Date: 03/02/21                            Expected End Date: 03/02/22                      Follow Up within 90 days   - check blood sugar at prescribed times - check blood sugar before and after exercise - check blood sugar if I feel it is too high or too low - take the blood sugar meter to all doctor visits    Why is this important?   Checking your blood sugar at home helps to keep it from getting very high or very low.  Writing the results in a diary or log helps the doctor know how to care for you.  Your blood sugar log should have the time, date and the results.  Also, write down the amount of insulin or other medicine that you take.  Other information, like what you ate, exercise done and how you were feeling, will also be helpful.     Notes:         Patient Care Plan: General Pharmacy (Adult)     Problem Identified: Hypertension, Hyperlipidemia, Diabetes, Coronary Artery Disease, and GERD   Priority: High     [place] Goal: Patient-Specific Goal   Start Date: 03/02/2021  Expected End Date: 03/02/2022  This Visit's Progress: On track  Priority: High  Note:   Current Barriers:  No barriers noted  Pharmacist Clinical Goal(s):  Patient will maintain control of diabetes as evidenced by A1c less than 8%  through collaboration with PharmD and provider.   Interventions: 1:1 collaboration with Alba Cory, MD regarding development and update of comprehensive plan of care as evidenced by provider attestation and co-signature Inter-disciplinary care team collaboration (see longitudinal plan of care) Comprehensive medication review performed; medication list updated in electronic medical  record  Hypertension (BP goal <140/90) -Controlled -Current treatment: None -Medications previously tried: Lisinopril, Losartan   -Current home readings: NA -Denies hypotensive/hypertensive symptoms -Counseled to monitor BP at home weekly, document, and provide log at future appointments -Recommended to continue current medication  Hyperlipidemia: (LDL goal < 70) -Controlled -Current treatment: Atorvastatin 40 mg daily  -Medications previously tried: NA -Recommended to continue current medication  Diabetes (A1c goal <8%) -Controlled -Current medications: Jardiance 25 mg daily  Metformin XR 750 mg daily  -Medications previously tried: NA  -Current home glucose readings fasting glucose: 110-150 -Denies hypoglycemic/hyperglycemic symptoms -Current meal patterns: Trying to cut back on sweets. Drinks diet soda estimates 4-5 bottles 12.5 oz daily, -Current exercise: Walking, Physical therapy (3x weekly) -Recommended to continue current medication  Chronic Bronchitis (Goal: control symptoms and prevent exacerbations) -Controlled -Current treatment  Ventolin HFA 2 puffs every 6 hours as needed  Trelegy 1 puff daily  -Medications previously tried: NA  -Exacerbations requiring treatment in last 6 months: None -Patient reports consistent use of maintenance inhaler -Frequency of rescue inhaler use: <1 x weekly  -Counseled on Proper inhaler technique; -Recommended to continue current medication   Patient Goals/Self-Care  Activities Patient will:  - check glucose daily before breakfast, document, and provide at future appointments check blood pressure weekly, document, and provide at future appointments  Follow Up Plan: Telephone follow up appointment with care management team member scheduled for:  08/31/2021 at 3:00 PM      Patient agreed to services and verbal consent obtained.   The patient verbalized understanding of instructions, educational materials, and care plan  provided today and declined offer to receive copy of patient instructions, educational materials, and care plan.   Cheyenne Adas, CPP Clinical Pharmacist [facility] [phone]

## 2021-03-02 NOTE — Progress Notes (Signed)
Chronic Care Management Pharmacy Note  03/02/2021 Name:  Julie Wall MRN:  [id number] DOB:  [birth year]  Summary: Patient presents for CCM consult. She is doing well today. She is still struggling after the unexpected loss of her sister, but feels she is slowly getting better. She does not have her glucometer today, but reports her blood sugars have been ranging 110-150. She is trying to cut back on sweet intake.   Proper Trelegy use reviewed in detail with patient.   Recommendations/Changes made from today's visit: Continue current medications  Plan: CPP follow-up 6 months   Subjective: Julie Wall is an 65 y.o. year old female who is a primary patient of Sowles, Drue Stager, MD.  The CCM team was consulted for assistance with disease management and care coordination needs.    Engaged with patient by telephone for follow up visit in response to provider referral for pharmacy case management and/or care coordination services.   Consent to Services:  The patient was given information about Chronic Care Management services, agreed to services, and gave verbal consent prior to initiation of services.  Please see initial visit note for detailed documentation.   Patient Care Team: Steele Sizer, MD as PCP - General (Family Medicine) Ernesto Rutherford, MD as Referring Physician (Dermatology) Vladimir Crofts, MD as Consulting Physician (Neurology) Yolonda Kida, MD as Consulting Physician (Cardiology) Germaine Pomfret, [facility] as Pharmacist (Pharmacist)  Recent office visits: 02/22/2021 Steele Sizer, MD (PCP Office Visit) for Follow-up- Started Fluticasone 1 puff daily; discontined Benzonatate 100 mg course completed; A1C ordered; patient instructed to follow-up in 4 months.    02/04/2021 Serafina Royals, FNP (PCP Video Visit) for COVID Positive- Started Benzonatate 100 mg twice daily   10/26/2020 Kathrine Haddock, NP (PCP office Visit) for Annual Exam- No medication changes noted    10/22/2020 Steele Sizer, MD (PCP Office Visit) for Follow-up- Diazepam 5 mg discontinued; Losartan Potassium 25 mg Discontinued; labs ordered,   Recent consult visits: 09/09/2020  Hemang Leeanne Deed, MD (Neurology)  Hospital visits: None in previous 6 months   Objective:  Lab Results  Component Value Date   CREATININE 0.56 10/22/2020   BUN 12 10/22/2020   GFRNONAA 97 10/22/2020   GFRAA 113 10/22/2020   NA 140 10/22/2020   K 4.1 10/22/2020   CALCIUM 9.4 10/22/2020   CO2 25 10/22/2020   GLUCOSE 120 (H) 10/22/2020    Lab Results  Component Value Date/Time   HGBA1C 7.6 (A) 02/22/2021 10:29 AM   HGBA1C 6.8 (A) 10/22/2020 10:27 AM   HGBA1C 7.3 (A) 10/21/2018 09:51 AM   HGBA1C 7.2 (A) 04/19/2018 11:35 AM   HGBA1C 6.5 (A) 01/16/2018 10:56 AM   MICROALBUR 0.2 10/22/2020 11:03 AM   MICROALBUR 0.2 09/05/2019 11:08 AM   MICROALBUR negative 01/16/2018 10:57 AM   MICROALBUR 20 12/13/2016 10:24 AM    Last diabetic Eye exam:  Lab Results  Component Value Date/Time   HMDIABEYEEXA No Retinopathy 10/18/2020 12:00 AM    Last diabetic Foot exam:  Lab Results  Component Value Date/Time   HMDIABFOOTEX normal 08/04/2015 12:00 AM     Lab Results  Component Value Date   CHOL 92 10/22/2020   HDL 45 (L) 10/22/2020   LDLCALC 34 10/22/2020   TRIG 46 10/22/2020   CHOLHDL 2.0 10/22/2020    Hepatic Function Latest Ref Rng & Units 10/22/2020 02/10/2020 02/09/2020  Total Protein 6.1 - 8.1 g/dL 6.7 7.0 7.2  Albumin 3.5 - 5.0 g/dL - 2.7(L) 2.8(L)  AST 10 -  35 U/L 10 27 41  ALT 6 - 29 U/L _0 Alk Phosphatase 38 - 126 U/L - 80 84  Total Bilirubin 0.2 - 1.2 mg/dL 0.3 0.6 0.8    Lab Results  Component Value Date/Time   TSH 0.94 10/22/2020 11:03 AM   TSH 1.22 06/20/2017 12:13 PM    CBC Latest Ref Rng & Units 10/22/2020 02/10/2020 02/09/2020  WBC 3.8 - 10.8 Thousand/uL 8.1 13.8(H) 17.0(H)  Hemoglobin 11.7 - 15.5 g/dL 14.9 14.2 12.8  Hematocrit 35.0 - 45.0 % 43.6 41.9 37.1   Platelets 140 - 400 Thousand/uL 205 316 325    Lab Results  Component Value Date/Time   VD25OH 30 10/22/2020 11:03 AM   VD25OH 15 (L) 10/19/2017 09:49 AM    Clinical ASCVD: No  The ASCVD Risk score (Arnett DK, et al., 2019) failed to calculate for the following reasons:   The valid total cholesterol range is 130 to 320 mg/dL    Depression screen [facility] 2/9 02/04/2021 10/26/2020 10/22/2020  Decreased Interest 0 0 0  Down, Depressed, Hopeless 0 0 0  PHQ - 2 Score 0 0 0  Altered sleeping - 0 -  Tired, decreased energy - 0 -  Change in appetite - 0 -  Feeling bad or failure about yourself  - 0 -  Trouble concentrating - 0 -  Moving slowly or fidgety/restless - 0 -  Suicidal thoughts - 0 -  PHQ-9 Score - 0 -  Difficult doing work/chores - Not difficult at all -  Some recent data might be hidden    Social History   Tobacco Use  Smoking Status Former   Packs/day: 1.00   Years: 46.00   Pack years: 46.00   Types: Cigarettes   Start date: 01/07/1985   Quit date: 02/01/2020   Years since quitting: 1.0  Smokeless Tobacco Never   BP Readings from Last 3 Encounters:  02/22/21 118/74  11/23/20 130/74  10/26/20 124/72   Pulse Readings from Last 3 Encounters:  02/22/21 91  10/26/20 95  10/22/20 (!) 101   Wt Readings from Last 3 Encounters:  02/22/21 161 lb 11.2 oz (73.3 kg)  11/23/20 162 lb (73.5 kg)  10/26/20 165 lb 6.4 oz (75 kg)   BMI Readings from Last 3 Encounters:  02/22/21 29.58 kg/m  11/23/20 32.72 kg/m  10/26/20 28.39 kg/m    Assessment/Interventions: Review of patient past medical history, allergies, medications, health status, including review of consultants reports, laboratory and other test data, was performed as part of comprehensive evaluation and provision of chronic care management services.   SDOH:  (Social Determinants of Health) assessments and interventions performed: Yes SDOH Interventions    Flowsheet Row Most Recent Value  SDOH Interventions    Financial Strain Interventions Intervention Not Indicated      SDOH Screenings   Alcohol Screen: Low Risk    Last Alcohol Screening Score (AUDIT): 0  Depression (PHQ2-9): Low Risk    PHQ-2 Score: 0  Financial Resource Strain: Low Risk    Difficulty of Paying Living Expenses: Not hard at all  Food Insecurity: No Food Insecurity   Worried About Charity fundraiser in the Last Year: Never true   Ran Out of Food in the Last Year: Never true  Housing: Low Risk    Last Housing Risk Score: 0  Physical Activity: Sufficiently Active   Days of Exercise per Week: 4 days   Minutes of Exercise per Session: 40 min  Social Connections: Moderately  Isolated   Frequency of Communication with Friends and Family: Once a week   Frequency of Social Gatherings with Friends and Family: Once a week   Attends Religious Services: More than 4 times per year   Active Member of [employer] or Organizations: Yes   Attends Music therapist: More than 4 times per year   Marital Status: Never married  Stress: No Stress Concern Present   Feeling of Stress : Only a little  Tobacco Use: Medium Risk   Smoking Tobacco Use: Former   Smokeless Tobacco Use: Never  Transportation Needs: No Data processing manager (Medical): No   Lack of Transportation (Non-Medical): No    CCM Care Plan  Allergies  Allergen Reactions   Penicillins Rash    Medications Reviewed Today     Reviewed by Royal Hawthorn, CMA (Certified Medical Assistant) on 02/22/21 at 0930  Med List Status: <None>   Medication Order Taking? Sig Documenting Provider Last Dose Status Informant  ACCU-CHEK AVIVA PLUS test strip [id number]  CHECK BLOOD SUGAR ONCE DAILY Ancil Boozer, Drue Stager, MD  Active   albuterol (VENTOLIN HFA) 108 (90 Base) MCG/ACT inhaler [id number]  Inhale 2 puffs into the lungs every 6 (six) hours as needed for wheezing or shortness of breath. Steele Sizer, MD  Active   Alcohol Swabs (ALCOHOL PREP) 70 %  PADS [id number]  CHECK BLOOD SUGAR 2 TIMES A Eloise Harman, MD  Active   AquaLance Lancets 30G MISC [id number]  CHECK BLOOD SUGAR 2 TIMES A Bernette Mayers, Drue Stager, MD  Active   aspirin 81 MG chewable tablet [id number]  Chew 1 tablet by mouth daily. [provider]  Active Other           Med Note Francene Finders   Thu Feb 05, 2020  8:40 AM)    atorvastatin (LIPITOR) 40 MG tablet [id number]  TAKE 1 TABLET BY MOUTH DAILY (IN PLACE OF SIMVASTATIN) Steele Sizer, MD  Active   baclofen (LIORESAL) 10 MG tablet [id number]  Take 10 mg by mouth 3 (three) times daily. [provider]  Active   Baclofen 5 MG TABS [id number]  Take 1 tablet by mouth 2 (two) times daily as needed. [provider]  Active   benzonatate (TESSALON) 100 MG capsule [id number]  Take 1 capsule (100 mg total) by mouth 2 (two) times daily as needed for cough. Bo Merino, FNP  Active   Cholecalciferol (VITAMIN D) 50 MCG (2000 UT) CAPS [id number]  Take 1 capsule (2,000 Units total) by mouth daily. Steele Sizer, MD  Active   Cyanocobalamin (B-12) 1000 MCG SUBL [id number]  Place 1 tablet under the tongue daily. Steele Sizer, MD  Active   dorzolamide-timolol (COSOPT) 22.3-6.8 MG/ML ophthalmic solution [id number]  Place 1 drop into both eyes 2 (two) times daily.  [provider]  Active Self  DULoxetine (CYMBALTA) 20 MG capsule [id number]  Take 20 mg by mouth 2 (two) times daily. [provider]  Active   folic acid (FOLVITE) 1 MG tablet [id number]  Take 1 mg by mouth daily.  [provider]  Active Other           Med Note Netty Starring Feb 05, 2020 10:23 AM)    gabapentin (NEURONTIN) 100 MG capsule [id number]  Take 200 mg by mouth 3 (three) times daily. [provider]  Active Pharmacy Records  HYDROcodone-acetaminophen (NORCO/VICODIN) 5-325 MG tablet [id number]  Take 2 tablets by mouth 2 (two) times daily  as needed for moderate pain. Steele Sizer, MD  Active    JARDIANCE 25 MG TABS tablet [id number]  TAKE 1 TABLET BY MOUTH DAILY Steele Sizer, MD  Active   Lancet Devices (SIMPLE DIAGNOSTICS LANCING DEV) MISC [id number]  Check blood sugar once daily fasting, and up to 4 times daily PRN. Hubbard Hartshorn, FNP  Active Other  metFORMIN (GLUCOPHAGE-XR) 750 MG 24 hr tablet [id number]  TAKE 1 TABLET BY MOUTH DAILY WITH Alta Corning, Drue Stager, MD  Active   methotrexate (RHEUMATREX) 2.5 MG tablet [id number]  Take 15 mg by mouth every Friday.    Active Self           Med Note Netty Starring Feb 05, 2020  8:40 AM)    POLYTRIM ophthalmic solution [id number]  Place into the left eye. [provider]  Active   prednisoLONE acetate (PRED FORTE) 1 % ophthalmic suspension [id number]   [provider]  Active   rOPINIRole (REQUIP XL) 4 MG 24 hr tablet [id number]  Take 4 mg by mouth at bedtime.  Vladimir Crofts, MD  Active Self  triamcinolone ointment (KENALOG) 0.1 % [id number]  Apply topically 2 (two) times daily. Lorella Nimrod, MD  Active   VIGAMOX 0.5 % ophthalmic solution [id number]   [provider]  Active             Patient Active Problem List   Diagnosis Date Noted   Diabetic polyneuropathy associated with type 2 diabetes mellitus ([place])    Weakness    Hilar lymphadenopathy 02/05/2020   History of COVID-19 02/05/2020   Atherosclerosis of aorta ([place]) 05/04/2017   Pap smear abnormality of cervix with ASCUS favoring benign 04/23/2017   Vitamin D deficiency 12/14/2016   Obesity (BMI 30.0-34.9) 12/13/2015   Chronic bronchitis ([place]) 05/14/2015   Diabetes mellitus ([place]) 05/14/2015   Positive H. pylori test 04/05/2015   Rapid urease test for Helicobacter pylori infection positive 62/37/6283   Umbilical hernia 15/17/6160   Exomphalos 04/01/2015   RLS (restless legs syndrome) 01/08/2015   Type 2 diabetes mellitus with hyperlipidemia ([place]) 01/08/2015   Neurogenic claudication ([place]) 01/08/2015   AD (atopic dermatitis) 12/28/2014    Chronic LBP 12/28/2014   Dyslipidemia 12/28/2014   Gastroesophageal reflux disease without esophagitis 12/28/2014   Microalbuminuria 12/28/2014   Disorder of bone and cartilage 12/28/2014   Neuralgia of left thigh 12/28/2014   Perennial allergic rhinitis with seasonal variation 12/28/2014   Tobacco abuse 12/28/2014   Mononeuropathy of left lower extremity 12/28/2014   Hypothyroidism 09/22/2005   Hypertension, benign 09/22/2005    Immunization History  Administered Date(s) Administered   Fluad Quad(high Dose 65+) 03/02/2020   Influenza Split 04/09/2012   Influenza, High Dose Seasonal PF 02/22/2021   Influenza, Seasonal, Injecte, Preservative Fre 02/17/2011   Influenza,inj,Quad PF,6+ Mos 07/16/2013, 03/23/2014, 04/01/2015, 03/14/2016, 03/16/2017, 02/19/2018, 05/07/2019   Influenza-Unspecified 07/16/2013, 03/23/2014, 04/01/2015, 03/14/2016, 03/16/2017, 02/19/2018, 05/07/2019   Pneumococcal Conjugate-13 04/01/2015   Pneumococcal Polysaccharide-23 01/26/2010, 09/05/2019   Tdap 01/25/2012   Zoster, Live 01/08/2015    Conditions to be addressed/monitored:  Hypertension, Hyperlipidemia, Diabetes, Coronary Artery Disease, and GERD  Care Plan : General Pharmacy (Adult)  Updates made by Germaine Pomfret, RPH since 03/02/2021 12:00 AM     Problem: Hypertension, Hyperlipidemia, Diabetes, Coronary Artery Disease, and GERD   Priority: High     [place] Goal: Patient-Specific Goal   Start Date: 03/02/2021  Expected End Date: 03/02/2022  This Visit's Progress: On track  Priority: High  Note:   Current Barriers:  No barriers noted  Pharmacist Clinical Goal(s):  Patient will maintain control of diabetes as evidenced by A1c less than 8%  through collaboration with PharmD and provider.   Interventions: 1:1 collaboration with Steele Sizer, MD regarding development and update of comprehensive plan of care as evidenced by provider attestation and co-signature Inter-disciplinary care  team collaboration (see longitudinal plan of care) Comprehensive medication review performed; medication list updated in electronic medical record  Hypertension (BP goal <140/90) -Controlled -Current treatment: None -Medications previously tried: Lisinopril, Losartan   -Current home readings: NA -Denies hypotensive/hypertensive symptoms -Counseled to monitor BP at home weekly, document, and provide log at future appointments -Recommended to continue current medication  Hyperlipidemia: (LDL goal < 70) -Controlled -Current treatment: Atorvastatin 40 mg daily  -Medications previously tried: NA -Recommended to continue current medication  Diabetes (A1c goal <8%) -Controlled -Current medications: Jardiance 25 mg daily  Metformin XR 750 mg daily  -Medications previously tried: NA  -Current home glucose readings fasting glucose: 110-150 -Denies hypoglycemic/hyperglycemic symptoms -Current meal patterns: Trying to cut back on sweets. Drinks diet soda estimates 4-5 bottles 12.5 oz daily, -Current exercise: Walking, Physical therapy (3x weekly) -Recommended to continue current medication  Chronic Bronchitis (Goal: control symptoms and prevent exacerbations) -Controlled -Current treatment  Ventolin HFA 2 puffs every 6 hours as needed  Trelegy 1 puff daily  -Medications previously tried: NA  -Exacerbations requiring treatment in last 6 months: None -Patient reports consistent use of maintenance inhaler -Frequency of rescue inhaler use: <1 x weekly  -Counseled on Proper inhaler technique; -Recommended to continue current medication   Patient Goals/Self-Care Activities Patient will:  - check glucose daily before breakfast, document, and provide at future appointments check blood pressure weekly, document, and provide at future appointments  Follow Up Plan: Telephone follow up appointment with care management team member scheduled for:  08/31/2021 at 3:00 PM      Medication  Assistance: None required.  Patient affirms current coverage meets needs.  Compliance/Adherence/Medication fill history: Care Gaps: None ID  Star-Rating Drugs: Jardiance 25 mg last filled on 02/14/2021 for a 90-Day supply with [facility] Metformin 750 mg last filled on 02/14/2021 for a 90-Day supply with [facility] Atorvastatin 40 mg last filled on 02/14/2021 for a 90-Day supply with [facility]  Patient's preferred pharmacy is:  [place], [address] [place] [address] Phone: [phone] Fax: [phone]  [place], [place] - [address], [place] [address] [place] [address] Phone: [phone] Fax: [phone]  Uses pill box? Yes Pt endorses 100% compliance  We discussed: Current pharmacy is preferred with insurance plan and patient is satisfied with pharmacy services Patient decided to: Continue current medication management strategy  Care Plan and Follow Up Patient Decision:  Patient agrees to Care Plan and Follow-up.  Plan: Telephone follow up appointment with care management team member scheduled for:  08/31/2021 at 3:00 PM  Malva Limes, [facility] [phone]

## 2021-03-04 DIAGNOSIS — E785 Hyperlipidemia, unspecified: Secondary | ICD-10-CM

## 2021-03-04 DIAGNOSIS — J410 Simple chronic bronchitis: Secondary | ICD-10-CM

## 2021-03-04 DIAGNOSIS — E1169 Type 2 diabetes mellitus with other specified complication: Secondary | ICD-10-CM

## 2021-03-04 DIAGNOSIS — M4727 Other spondylosis with radiculopathy, lumbosacral region: Secondary | ICD-10-CM | POA: Diagnosis not present

## 2021-03-08 DIAGNOSIS — M4727 Other spondylosis with radiculopathy, lumbosacral region: Secondary | ICD-10-CM | POA: Diagnosis not present

## 2021-04-18 DIAGNOSIS — H43811 Vitreous degeneration, right eye: Secondary | ICD-10-CM | POA: Diagnosis not present

## 2021-05-09 DIAGNOSIS — L2084 Intrinsic (allergic) eczema: Secondary | ICD-10-CM | POA: Diagnosis not present

## 2021-05-09 DIAGNOSIS — Z79899 Other long term (current) drug therapy: Secondary | ICD-10-CM | POA: Diagnosis not present

## 2021-05-09 DIAGNOSIS — L2089 Other atopic dermatitis: Secondary | ICD-10-CM | POA: Diagnosis not present

## 2021-05-18 ENCOUNTER — Telehealth: Payer: Self-pay

## 2021-05-18 NOTE — Progress Notes (Signed)
Chronic Care Management Pharmacy Assistant   Name: Julie Wall  MRN: [id number] DOB: May 17, 1956  Reason for Encounter: Diabetes Disease State Call   Recent office visits:  None ID  Recent consult visits:  05/09/2021 Vivi Ferns, MD (Dermatology) for Eczema- Start: Ferrous sulfate 325 mg daily, Methotrexate 7.5 mg weekly, Folic Acid 1 mg on nonmethotrexate days, Lidex Ointment bid for scalp, patient instructed to return in 3 months  Hospital visits:  None in previous 6 months  Medications: Outpatient Encounter Medications as of 05/18/2021  Medication Sig   ACCU-CHEK AVIVA PLUS test strip CHECK BLOOD SUGAR ONCE DAILY   albuterol (VENTOLIN HFA) 108 (90 Base) MCG/ACT inhaler Inhale 2 puffs into the lungs every 6 (six) hours as needed for wheezing or shortness of breath.   Alcohol Swabs (ALCOHOL PREP) 70 % PADS CHECK BLOOD SUGAR 2 TIMES A DAY   AquaLance Lancets 30G MISC CHECK BLOOD SUGAR 2 TIMES A DAY   aspirin 81 MG chewable tablet Chew 1 tablet by mouth daily.   atorvastatin (LIPITOR) 40 MG tablet TAKE 1 TABLET BY MOUTH DAILY (IN PLACE OF SIMVASTATIN)   baclofen (LIORESAL) 10 MG tablet Take 10 mg by mouth 3 (three) times daily.   Baclofen 5 MG TABS Take 1 tablet by mouth 2 (two) times daily as needed.   Cholecalciferol (VITAMIN D) 50 MCG (2000 UT) CAPS Take 1 capsule (2,000 Units total) by mouth daily.   Cyanocobalamin (B-12) 1000 MCG SUBL Place 1 tablet under the tongue daily.   dorzolamide-timolol (COSOPT) 22.3-6.8 MG/ML ophthalmic solution Place 1 drop into both eyes 2 (two) times daily.    DULoxetine (CYMBALTA) 20 MG capsule Take 20 mg by mouth 2 (two) times daily.   Fluticasone-Umeclidin-Vilant (TRELEGY ELLIPTA) 100-62.5-25 MCG/INH AEPB Inhale 1 puff into the lungs daily.   folic acid (FOLVITE) 1 MG tablet Take 1 mg by mouth daily.    gabapentin (NEURONTIN) 100 MG capsule Take 200 mg by mouth 3 (three) times daily.   gabapentin (NEURONTIN) 300 MG capsule Take by  mouth.   HYDROcodone-acetaminophen (NORCO/VICODIN) 5-325 MG tablet Take 2 tablets by mouth 2 (two) times daily as needed for moderate pain.   JARDIANCE 25 MG TABS tablet TAKE 1 TABLET BY MOUTH DAILY   Lancet Devices (SIMPLE DIAGNOSTICS LANCING DEV) MISC Check blood sugar once daily fasting, and up to 4 times daily PRN.   metFORMIN (GLUCOPHAGE-XR) 750 MG 24 hr tablet TAKE 1 TABLET BY MOUTH DAILY WITH BREAKFAST   methotrexate (RHEUMATREX) 2.5 MG tablet Take 15 mg by mouth every Friday.    POLYTRIM ophthalmic solution Place into the left eye.   prednisoLONE acetate (PRED FORTE) 1 % ophthalmic suspension    rOPINIRole (REQUIP XL) 4 MG 24 hr tablet Take 4 mg by mouth at bedtime.    triamcinolone ointment (KENALOG) 0.1 % Apply topically 2 (two) times daily.   VIGAMOX 0.5 % ophthalmic solution    No facility-administered encounter medications on file as of 05/18/2021.   Care Gaps: COVID-19 Vaccine Diabetic Foot Exam  Star Rating Drugs: Jardiance 25 mg last filled on 02/14/2021 for a 90-Day supply with [facility] Metformin 750 mg last filled on 02/14/2021 for a 90-Day supply with [facility] Atorvastatin 40 mg last filled on 02/14/2021 for a 90-Day supply with [facility]  Recent Relevant Labs: Lab Results  Component Value Date/Time   HGBA1C 7.6 (A) 02/22/2021 10:29 AM   HGBA1C 6.8 (A) 10/22/2020 10:27 AM   HGBA1C 7.3 (  A) 10/21/2018 09:51 AM   HGBA1C 7.2 (A) 04/19/2018 11:35 AM   HGBA1C 6.5 (A) 01/16/2018 10:56 AM   MICROALBUR 0.2 10/22/2020 11:03 AM   MICROALBUR 0.2 09/05/2019 11:08 AM   MICROALBUR negative 01/16/2018 10:57 AM   MICROALBUR 20 12/13/2016 10:24 AM    Kidney Function Lab Results  Component Value Date/Time   CREATININE 0.56 10/22/2020 11:03 AM   CREATININE 0.59 02/10/2020 06:10 AM   CREATININE 0.70 02/09/2020 04:21 AM   CREATININE 0.70 09/05/2019 11:08 AM   GFRNONAA 97 10/22/2020 11:03 AM   GFRAA 113 10/22/2020 11:03 AM     Current antihyperglycemic regimen:  Jardiance 25 mg daily  Metformin XR 750 mg daily   What recent interventions/DTPs have been made to improve glycemic control:  None ID  Have there been any recent hospitalizations or ED visits since last visit with CPP? No  Patient denies hypoglycemic symptoms, including Pale, Sweaty, Shaky, Hungry, Nervous/irritable, and Vision changes  Patient denies hyperglycemic symptoms, including blurry vision, excessive thirst, fatigue, polyuria, and weakness  How often are you checking your blood sugar? once daily  What are your blood sugars ranging?  Fasting: 113 this morning  During the week, how often does your blood glucose drop below 70? Never Are you checking your feet daily/regularly? Yes  Adherence Review: Is the patient currently on a STATIN medication? Yes Is the patient currently on ACE/ARB medication? No Does the patient have >5 day gap between last estimated fill dates? No  Patient has a telephone appointment with Angelena Sole, CPP on 08/31/2020 @ 1500  Patient reports that she is feeling well. Patient denies any ill symptoms at this time. Patient reports that she is doing well on the new medications that her Dermatologist started her own. She denies any side effects at this time Patient has no concerns, or issues. Patient stated she is taking all medications as prescribed, and currently does not need any refills.   Adelene Idler, CPA/CMA Clinical Pharmacist Assistant Phone: [phone]

## 2021-06-20 LAB — HEMOGLOBIN A1C: Hemoglobin A1C: 8.4

## 2021-06-28 NOTE — Progress Notes (Signed)
Name: Julie Wall   MRN: [id number]    DOB: 07/29/1955   Date:06/29/2021       Progress Note  Subjective  Chief Complaint  Follow Up  HPI  Diabetes follow up: A1C went up from 6.8 % to 7.6 % and [facility] on 06/20/21 and A1C was up to 8.4 %. She states her diet has not been good, did not follow a diabetic diet through the holidays.  She states glucose at home 105-115 .She also has associated obesity, dyslipidemia,  and microalbuminuria. She is on Jardiance and Metformin 750 mg. We stopped Losartan because her bp was low. She has polyphagia, polydipsia but has polyuria.    Eczema: she [facility], Dr. Amada KingfisherLugo-Somolinos  last visit was 05/2021  She is taking Methotrexate 7.5 mg weekly, and also folic acid  . Eczema is under control , she states hair is growing again   Hyperlipidemia/Atherosclerosis of aorta : she is back  Atorvastatin and aspirin daily , last LDL 34 -at goal,  we will labs next visit    COPD/Intesticial lung disease on CT chest : she quit smoking 12/03/2016   but resumed smoking Fall 2019 l, she quit again when she developed COVID-19 in 2021, but resumed smoking again since her sister diet this past Summer, she has been smoking 3-4 cigarettes daily, explained importance of quitting again .She is now on Trelegy but needs to follow up with pulmonologist - Dr. Marcos EkeGonzales    Back pain: . She states right leg is always painful 5/10 when she is very activity  and takes hydrocodone prn  She denies  leg weakness, no bowel or bladder incontinence, she has numbness on left lateral thigh.  She has been seeing Dr. Sherryll BurgerShah who ordered MRI and results below, she was advised to see neurosurgeon and had steroid injections and completed PT - pain is not as intense, it used to be 9/10 I give her hydrocodone to take prn only    IMPRESSION/MRI thoracic spine 09/04/2019    Diffuse degenerative disc narrowing and bulging with small protrusions. Dominant findings at  T11-12 where there is cord impingement with flattening and mild T2 hyperintensity by prior lumbar MRI.   IMPRESSION:08/03/2019   1. Moderate spinal stenosis L3-4 unchanged. Moderate subarticular stenosis bilaterally. 2. Severe spinal stenosis with progression since the prior study. Severe subarticular and foraminal stenosis on the left. 3. Moderate subarticular stenosis bilaterally L5-S1. 4. Progressive degenerative changes at T11-12 with significant spinal stenosis and mild cord signal abnormality. Thoracic MRI with dedicated axial images through this level may be helpful.   RLS: She sees neurologist, Dr. Fara BorosShahs,  and is on higher dose of Requip but symptoms not controlled , she has an appointment coming up soon    Patient Active Problem List   Diagnosis Date Noted   Diabetic polyneuropathy associated with type 2 diabetes mellitus (HCC)    Weakness    Hilar lymphadenopathy 02/05/2020   History of COVID-19 02/05/2020   Atherosclerosis of aorta (HCC) 05/04/2017   Pap smear abnormality of cervix with ASCUS favoring benign 04/23/2017   Vitamin D deficiency 12/14/2016   Obesity (BMI 30.0-34.9) 12/13/2015   Chronic bronchitis (HCC) 05/14/2015   Diabetes mellitus (HCC) 05/14/2015   Positive H. pylori test 04/05/2015   Rapid urease test for Helicobacter pylori infection positive 04/05/2015   Umbilical hernia 04/01/2015   Exomphalos 04/01/2015   RLS (restless legs syndrome) 01/08/2015   Type 2 diabetes mellitus with hyperlipidemia (  HCC) 01/08/2015   Neurogenic claudication (HCC) 01/08/2015   AD (atopic dermatitis) 12/28/2014   Chronic LBP 12/28/2014   Dyslipidemia 12/28/2014   Gastroesophageal reflux disease without esophagitis 12/28/2014   Microalbuminuria 12/28/2014   Disorder of bone and cartilage 12/28/2014   Neuralgia of left thigh 12/28/2014   Perennial allergic rhinitis with seasonal variation 12/28/2014   Tobacco abuse 12/28/2014   Mononeuropathy of left lower extremity  12/28/2014   Hypothyroidism 09/22/2005   Hypertension, benign 09/22/2005    Past Surgical History:  Procedure Laterality Date   CATARACT EXTRACTION Bilateral 1980   CESAREAN SECTION     CORNEAL TRANSPLANT Left    [facility]-Treatment for blindness.    Family History  Problem Relation Age of Onset   Diabetes Mother    Hypertension Mother    Pancreatic cancer Mother    Alcohol abuse Father    Arthritis/Rheumatoid Sister    Heart failure Sister    Diabetes Sister    Kidney disease Sister    Diabetes Brother    Breast cancer Neg Hx     Social History   Tobacco Use   Smoking status: Former    Packs/day: 1.00    Years: 46.00    Pack years: 46.00    Types: Cigarettes    Start date: 01/07/1985    Quit date: 02/01/2020    Years since quitting: 1.4   Smokeless tobacco: Never  Substance Use Topics   Alcohol use: No    Alcohol/week: 0.0 standard drinks     Current Outpatient Medications:    ACCU-CHEK AVIVA PLUS test strip, CHECK BLOOD SUGAR ONCE DAILY, Disp: 100 each, Rfl: 2   albuterol (VENTOLIN HFA) 108 (90 Base) MCG/ACT inhaler, Inhale 2 puffs into the lungs every 6 (six) hours as needed for wheezing or shortness of breath., Disp: 18 g, Rfl: 0   Alcohol Swabs (ALCOHOL PREP) 70 % PADS, CHECK BLOOD SUGAR 2 TIMES A DAY, Disp: 200 each, Rfl: 3   AquaLance Lancets 30G MISC, CHECK BLOOD SUGAR 2 TIMES A DAY, Disp: 200 each, Rfl: 3   aspirin 81 MG chewable tablet, Chew 1 tablet by mouth daily., Disp: , Rfl:    atorvastatin (LIPITOR) 40 MG tablet, TAKE 1 TABLET BY MOUTH DAILY (IN PLACE OF SIMVASTATIN), Disp: 90 tablet, Rfl: 1   Baclofen 5 MG TABS, Take 1 tablet by mouth 2 (two) times daily as needed., Disp: , Rfl:    Cholecalciferol (VITAMIN D) 50 MCG (2000 UT) CAPS, Take 1 capsule (2,000 Units total) by mouth daily., Disp: 100 capsule, Rfl: 1   Cyanocobalamin (B-12) 1000 MCG SUBL, Place 1 tablet under the tongue daily., Disp: 100 tablet, Rfl: 1   dorzolamide-timolol (COSOPT) 22.3-6.8  MG/ML ophthalmic solution, Place 1 drop into both eyes 2 (two) times daily. , Disp: , Rfl:    Fluticasone-Umeclidin-Vilant (TRELEGY ELLIPTA) 100-62.5-25 MCG/INH AEPB, Inhale 1 puff into the lungs daily., Disp: 1 each, Rfl: 5   folic acid (FOLVITE) 1 MG tablet, Take 1 mg by mouth daily. , Disp: , Rfl:    gabapentin (NEURONTIN) 300 MG capsule, Take 1 capsule by mouth 2 (two) times daily., Disp: , Rfl:    JARDIANCE 25 MG TABS tablet, TAKE 1 TABLET BY MOUTH DAILY, Disp: 90 tablet, Rfl: 1   Lancet Devices (SIMPLE DIAGNOSTICS LANCING DEV) MISC, Check blood sugar once daily fasting, and up to 4 times daily PRN., Disp: 100 each, Rfl: 2   methotrexate (RHEUMATREX) 2.5 MG tablet, Take 7.5 mg by mouth every Friday., Disp: ,  Rfl:    POLYTRIM ophthalmic solution, Place into the left eye., Disp: , Rfl:    prednisoLONE acetate (PRED FORTE) 1 % ophthalmic suspension, , Disp: , Rfl:    Ropinirole HCl 6 MG TB24, Take 1 tablet by mouth at bedtime as needed., Disp: , Rfl:    triamcinolone ointment (KENALOG) 0.1 %, Apply topically 2 (two) times daily., Disp: 30 g, Rfl: 0   VIGAMOX 0.5 % ophthalmic solution, , Disp: , Rfl:    HYDROcodone-acetaminophen (NORCO/VICODIN) 5-325 MG tablet, Take 2 tablets by mouth 2 (two) times daily as needed for moderate pain., Disp: 30 tablet, Rfl: 0   metFORMIN (GLUCOPHAGE-XR) 750 MG 24 hr tablet, Take 2 tablets (1,500 mg total) by mouth daily with breakfast., Disp: 180 tablet, Rfl: 1  Allergies  Allergen Reactions   Penicillins Rash    I personally reviewed active problem list, medication list, allergies, family history, social history, health maintenance with the patient/caregiver today.   ROS  Constitutional: Negative for fever or weight change.  Respiratory: Negative for cough and shortness of breath.   Cardiovascular: Negative for chest pain or palpitations.  Gastrointestinal: Negative for abdominal pain, no bowel changes.  Musculoskeletal: Positive for gait problem and  knee joint swelling.  Skin: Negative for rash.  Neurological: Negative for dizziness or headache.  No other specific complaints in a complete review of systems (except as listed in HPI above).   Objective  Vitals:   06/29/21 1125  BP: 126/68  Pulse: 86  Resp: 16  SpO2: 98%  Weight: 167 lb (75.8 kg)  Height: 4\' 11"  (1.499 m)    Body mass index is 33.73 kg/m.  Physical Exam  Constitutional: Patient appears well-developed and well-nourished. Obese  No distress.  HEENT: head atraumatic, normocephalic, pupils equal and reactive to light, ears normal  , neck supple,not done  Cardiovascular: Normal rate, regular rhythm and normal heart sounds.  No murmur heard. No BLE edema. Pulmonary/Chest: Effort normal and breath sounds normal. No respiratory distress. Abdominal: Soft.  There is no tenderness. Muscular skeletal : very slow gait, difficulty getting up from chair  Psychiatric: Patient has a normal mood and affect. behavior is normal. Judgment and thought content normal.   Recent Results (from the past 2160 hour(s))  Hemoglobin A1c     Status: None   Collection Time: 06/20/21 12:00 AM  Result Value Ref Range   Hemoglobin A1C 8.4     Diabetic Foot Exam: Diabetic Foot Exam - Simple   Simple Foot Form Diabetic Foot exam was performed with the following findings: Yes 06/29/2021 12:02 PM  Visual Inspection See comments: Yes Sensation Testing Intact to touch and monofilament testing bilaterally: Yes Pulse Check Posterior Tibialis and Dorsalis pulse intact bilaterally: Yes Comments Bunion       PHQ2/9: Depression screen [facility] 2/9 06/29/2021 02/04/2021 10/26/2020 10/22/2020 09/29/2020  Decreased Interest 0 0 0 0 0  Down, Depressed, Hopeless 0 0 0 0 0  PHQ - 2 Score 0 0 0 0 0  Altered sleeping 0 - 0 - -  Tired, decreased energy 0 - 0 - -  Change in appetite 0 - 0 - -  Feeling bad or failure about yourself  0 - 0 - -  Trouble concentrating 0 - 0 - -  Moving slowly or  fidgety/restless 0 - 0 - -  Suicidal thoughts 0 - 0 - -  PHQ-9 Score 0 - 0 - -  Difficult doing work/chores - - Not difficult at all - -  Some recent data might be hidden    phq 9 is negative   Fall Risk: Fall Risk  06/29/2021 02/04/2021 10/26/2020 10/22/2020 09/29/2020  Falls in the past year? 0 0 0 0 0  Number falls in past yr: 0 0 0 0 0  Injury with Fall? 0 0 0 0 0  Comment - - - - -  Risk Factor Category  - - - - -  Risk for fall due to : No Fall Risks No Fall Risks - - -  Risk for fall due to: Comment - - - - -  Follow up Falls prevention discussed Falls prevention discussed Falls evaluation completed - -      Functional Status Survey: Is the patient deaf or have difficulty hearing?: No Does the patient have difficulty seeing, even when wearing glasses/contacts?: No Does the patient have difficulty concentrating, remembering, or making decisions?: No Does the patient have difficulty walking or climbing stairs?: No Does the patient have difficulty dressing or bathing?: No Does the patient have difficulty doing errands alone such as visiting a doctor's office or shopping?: No    Assessment & Plan  1. Type 2 diabetes mellitus with hyperlipidemia (HCC)  - HM Diabetes Foot Exam  2. Hypertension associated with diabetes (HCC)   3. Simple chronic bronchitis (HCC)   4. Neurogenic claudication (HCC)  - HYDROcodone-acetaminophen (NORCO/VICODIN) 5-325 MG tablet; Take 2 tablets by mouth 2 (two) times daily as needed for moderate pain.  Dispense: 30 tablet; Refill: 0  5. Atherosclerosis of aorta (HCC)  On statin   6. Interstitial pneumonitis (HCC)  We schedule a follow up with Dr. Marcos EkeGonzales for her   7. Chronic bilateral low back pain without sciatica  - HYDROcodone-acetaminophen (NORCO/VICODIN) 5-325 MG tablet; Take 2 tablets by mouth 2 (two) times daily as needed for moderate pain.  Dispense: 30 tablet; Refill: 0  8. B12 deficiency   9. RLS (restless legs  syndrome)   10. Vitamin D deficiency   11. Other eczema   12. Colon cancer screening  - Cologuard  13. DM type 2 with diabetic peripheral neuropathy (HCC)  - metFORMIN (GLUCOPHAGE-XR) 750 MG 24 hr tablet; Take 2 tablets (1,500 mg total) by mouth daily with breakfast.  Dispense: 180 tablet; Refill: 1  14. Type 2 diabetes mellitus with microalbuminuria, without Ihde-term current use of insulin (HCC)  - metFORMIN (GLUCOPHAGE-XR) 750 MG 24 hr tablet; Take 2 tablets (1,500 mg total) by mouth daily with breakfast.  Dispense: 180 tablet; Refill: 1

## 2021-06-29 ENCOUNTER — Ambulatory Visit (INDEPENDENT_AMBULATORY_CARE_PROVIDER_SITE_OTHER): Payer: Medicare Other | Admitting: Family Medicine

## 2021-06-29 ENCOUNTER — Encounter: Payer: Self-pay | Admitting: Family Medicine

## 2021-06-29 VITALS — BP 126/68 | HR 86 | Resp 16 | Ht 59.0 in | Wt 167.0 lb

## 2021-06-29 DIAGNOSIS — E559 Vitamin D deficiency, unspecified: Secondary | ICD-10-CM

## 2021-06-29 DIAGNOSIS — E1169 Type 2 diabetes mellitus with other specified complication: Secondary | ICD-10-CM | POA: Diagnosis not present

## 2021-06-29 DIAGNOSIS — L308 Other specified dermatitis: Secondary | ICD-10-CM

## 2021-06-29 DIAGNOSIS — E785 Hyperlipidemia, unspecified: Secondary | ICD-10-CM

## 2021-06-29 DIAGNOSIS — G9519 Other vascular myelopathies: Secondary | ICD-10-CM

## 2021-06-29 DIAGNOSIS — E1159 Type 2 diabetes mellitus with other circulatory complications: Secondary | ICD-10-CM

## 2021-06-29 DIAGNOSIS — E1129 Type 2 diabetes mellitus with other diabetic kidney complication: Secondary | ICD-10-CM

## 2021-06-29 DIAGNOSIS — E538 Deficiency of other specified B group vitamins: Secondary | ICD-10-CM

## 2021-06-29 DIAGNOSIS — J8489 Other specified interstitial pulmonary diseases: Secondary | ICD-10-CM

## 2021-06-29 DIAGNOSIS — G8929 Other chronic pain: Secondary | ICD-10-CM

## 2021-06-29 DIAGNOSIS — J410 Simple chronic bronchitis: Secondary | ICD-10-CM | POA: Diagnosis not present

## 2021-06-29 DIAGNOSIS — I700 Atherosclerosis of aorta: Secondary | ICD-10-CM

## 2021-06-29 DIAGNOSIS — Z1211 Encounter for screening for malignant neoplasm of colon: Secondary | ICD-10-CM

## 2021-06-29 DIAGNOSIS — M545 Low back pain, unspecified: Secondary | ICD-10-CM

## 2021-06-29 DIAGNOSIS — E1142 Type 2 diabetes mellitus with diabetic polyneuropathy: Secondary | ICD-10-CM

## 2021-06-29 DIAGNOSIS — R809 Proteinuria, unspecified: Secondary | ICD-10-CM

## 2021-06-29 DIAGNOSIS — G2581 Restless legs syndrome: Secondary | ICD-10-CM

## 2021-06-29 DIAGNOSIS — I152 Hypertension secondary to endocrine disorders: Secondary | ICD-10-CM

## 2021-06-29 MED ORDER — HYDROCODONE-ACETAMINOPHEN 5-325 MG PO TABS: 2.0000 | ORAL_TABLET | Freq: Two times a day (BID) | ORAL | 0 refills | Status: DC | PRN

## 2021-06-29 MED ORDER — METFORMIN HCL ER 750 MG PO TB24: 1500.0000 mg | ORAL_TABLET | Freq: Every day | ORAL | 1 refills | Status: DC

## 2021-06-29 NOTE — Patient Instructions (Signed)
Please call to schedule mammography and bone density at [phone]

## 2021-07-05 ENCOUNTER — Ambulatory Visit: Payer: Medicare Other

## 2021-07-07 ENCOUNTER — Ambulatory Visit (INDEPENDENT_AMBULATORY_CARE_PROVIDER_SITE_OTHER): Payer: Medicare Other | Admitting: Pulmonary Disease

## 2021-07-07 ENCOUNTER — Other Ambulatory Visit: Payer: Self-pay

## 2021-07-07 ENCOUNTER — Encounter: Payer: Self-pay | Admitting: Pulmonary Disease

## 2021-07-07 ENCOUNTER — Telehealth: Payer: Self-pay

## 2021-07-07 VITALS — BP 100/60 | HR 69 | Temp 97.1°F | Ht 59.0 in | Wt 165.2 lb

## 2021-07-07 DIAGNOSIS — J411 Mucopurulent chronic bronchitis: Secondary | ICD-10-CM

## 2021-07-07 DIAGNOSIS — F17210 Nicotine dependence, cigarettes, uncomplicated: Secondary | ICD-10-CM

## 2021-07-07 DIAGNOSIS — Z87891 Personal history of nicotine dependence: Secondary | ICD-10-CM

## 2021-07-07 DIAGNOSIS — G4733 Obstructive sleep apnea (adult) (pediatric): Secondary | ICD-10-CM

## 2021-07-07 DIAGNOSIS — R918 Other nonspecific abnormal finding of lung field: Secondary | ICD-10-CM

## 2021-07-07 DIAGNOSIS — J449 Chronic obstructive pulmonary disease, unspecified: Secondary | ICD-10-CM

## 2021-07-07 MED ORDER — ALBUTEROL SULFATE HFA 108 (90 BASE) MCG/ACT IN AERS: 2.0000 | INHALATION_SPRAY | Freq: Four times a day (QID) | RESPIRATORY_TRACT | 0 refills | Status: DC | PRN

## 2021-07-07 NOTE — Telephone Encounter (Signed)
Patient was supposed to have CT in December of 2022  for lung cancer screening, however did not. Is it possible we work her back in?  Thanks

## 2021-07-07 NOTE — Progress Notes (Signed)
Subjective:    Patient ID: Julie Wall, female    DOB: 04-Oct-1955, 66 y.o.   MRN: [id number] Chief Complaint  Patient presents with   Follow-up    HPI This is a 66 year old former smoker (46-pack-year history) who presents for follow up of potential COPD. She also has been told she has "a spot" in her lung from lung cancer screening CT.  The patient currently is on Trelegy Ellipta.  She was initially seen here on 24 June 2020 and then was lost to follow-up.  At that time she had pulmonary function testing ordered which she never realized.  She also was noted to have symptoms consistent with sleep apnea and did indeed perform home sleep study which showed moderate sleep apnea.  She is on auto CPAP.  Notes benefit from the therapy.  Does not note any symptoms of dyspnea.  No cough or sputum production.  She had COVID-19 in September 2021 however did well from that perspective.  She has not noticed any sequela from that infection.  Most recent lung cancer screening CT was performed on 17 May 2020 and she needs to reenroll in the program.  I have reviewed those films and have reviewed as far back as 2007.  There are no significant changes.  I reviewed the films with the patient.  She has not had any weight loss or anorexia.  No fevers, chills or sweats.  No cough or sputum production.  No hemoptysis.  Does not endorse any orthopnea or paroxysmal nocturnal dyspnea.  She does get occasional lower extremity edema.  She does have occasional gastroesophageal reflux symptoms but does not require chronic medications for this.   The patient is on methotrexate for severe atopic dermatitis.  She has not noticed any symptomatology taking this medication.  She states that Trelegy does help with her COPD symptoms.  Review of Systems A 10 point review of systems was performed and it is as noted above otherwise negative.  Patient Active Problem List   Diagnosis Date Noted   Diabetic polyneuropathy  associated with type 2 diabetes mellitus (HCC)    Weakness    Hilar lymphadenopathy 02/05/2020   History of COVID-19 02/05/2020   Atherosclerosis of aorta (HCC) 05/04/2017   Pap smear abnormality of cervix with ASCUS favoring benign 04/23/2017   Vitamin D deficiency 12/14/2016   Obesity (BMI 30.0-34.9) 12/13/2015   Chronic bronchitis (HCC) 05/14/2015   Diabetes mellitus (HCC) 05/14/2015   Positive H. pylori test 04/05/2015   Rapid urease test for Helicobacter pylori infection positive 04/05/2015   Umbilical hernia 04/01/2015   Exomphalos 04/01/2015   RLS (restless legs syndrome) 01/08/2015   Type 2 diabetes mellitus with hyperlipidemia (HCC) 01/08/2015   Neurogenic claudication (HCC) 01/08/2015   AD (atopic dermatitis) 12/28/2014   Chronic LBP 12/28/2014   Dyslipidemia 12/28/2014   Gastroesophageal reflux disease without esophagitis 12/28/2014   Microalbuminuria 12/28/2014   Disorder of bone and cartilage 12/28/2014   Neuralgia of left thigh 12/28/2014   Perennial allergic rhinitis with seasonal variation 12/28/2014   Tobacco abuse 12/28/2014   Mononeuropathy of left lower extremity 12/28/2014   Hypothyroidism 09/22/2005   Hypertension, benign 09/22/2005   Social History   Tobacco Use   Smoking status: Every Day    Packs/day: 1.00    Years: 46.00    Pack years: 46.00    Types: Cigarettes    Start date: 01/07/1985    Last attempt to quit: 02/01/2020    Years since quitting: 1.4  Smokeless tobacco: Never   Tobacco comments:    8 cigs - 07/07/2021  Substance Use Topics   Alcohol use: No    Alcohol/week: 0.0 standard drinks   Allergies  Allergen Reactions   Penicillins Rash   Current Meds  Medication Sig   ACCU-CHEK AVIVA PLUS test strip CHECK BLOOD SUGAR ONCE DAILY   albuterol (VENTOLIN HFA) 108 (90 Base) MCG/ACT inhaler Inhale 2 puffs into the lungs every 6 (six) hours as needed for wheezing or shortness of breath.   Alcohol Swabs (ALCOHOL PREP) 70 % PADS CHECK BLOOD  SUGAR 2 TIMES A DAY   AquaLance Lancets 30G MISC CHECK BLOOD SUGAR 2 TIMES A DAY   aspirin 81 MG chewable tablet Chew 1 tablet by mouth daily.   atorvastatin (LIPITOR) 40 MG tablet TAKE 1 TABLET BY MOUTH DAILY (IN PLACE OF SIMVASTATIN)   Baclofen 5 MG TABS Take 1 tablet by mouth 2 (two) times daily as needed.   Cholecalciferol (VITAMIN D) 50 MCG (2000 UT) CAPS Take 1 capsule (2,000 Units total) by mouth daily.   Cyanocobalamin (B-12) 1000 MCG SUBL Place 1 tablet under the tongue daily.   dorzolamide-timolol (COSOPT) 22.3-6.8 MG/ML ophthalmic solution Place 1 drop into both eyes 2 (two) times daily.    Fluticasone-Umeclidin-Vilant (TRELEGY ELLIPTA) 100-62.5-25 MCG/INH AEPB Inhale 1 puff into the lungs daily.   folic acid (FOLVITE) 1 MG tablet Take 1 mg by mouth daily.    gabapentin (NEURONTIN) 300 MG capsule Take 1 capsule by mouth 2 (two) times daily.   HYDROcodone-acetaminophen (NORCO/VICODIN) 5-325 MG tablet Take 2 tablets by mouth 2 (two) times daily as needed for moderate pain.   JARDIANCE 25 MG TABS tablet TAKE 1 TABLET BY MOUTH DAILY   Lancet Devices (SIMPLE DIAGNOSTICS LANCING DEV) MISC Check blood sugar once daily fasting, and up to 4 times daily PRN.   metFORMIN (GLUCOPHAGE-XR) 750 MG 24 hr tablet Take 2 tablets (1,500 mg total) by mouth daily with breakfast.   methotrexate (RHEUMATREX) 2.5 MG tablet Take 7.5 mg by mouth every Friday.   POLYTRIM ophthalmic solution Place into the left eye.   prednisoLONE acetate (PRED FORTE) 1 % ophthalmic suspension    Ropinirole HCl 6 MG TB24 Take 1 tablet by mouth at bedtime as needed.   triamcinolone ointment (KENALOG) 0.1 % Apply topically 2 (two) times daily.   VIGAMOX 0.5 % ophthalmic solution    Immunization History  Administered Date(s) Administered   Fluad Quad(high Dose 65+) 03/02/2020   Influenza Split 04/09/2012   Influenza, High Dose Seasonal PF 02/22/2021   Influenza, Seasonal, Injecte, Preservative Fre 02/17/2011    Influenza,inj,Quad PF,6+ Mos 07/16/2013, 03/23/2014, 04/01/2015, 03/14/2016, 03/16/2017, 02/19/2018, 05/07/2019   Influenza-Unspecified 07/16/2013, 03/23/2014, 04/01/2015, 03/14/2016, 03/16/2017, 02/19/2018, 05/07/2019   Pneumococcal Conjugate-13 04/01/2015   Pneumococcal Polysaccharide-23 01/26/2010, 09/05/2019   Tdap 01/25/2012   Zoster, Live 01/08/2015       Objective:   Physical Exam BP 100/60 (BP Location: Left Arm, Patient Position: Sitting, Cuff Size: Normal)   Pulse 69   Temp (!) 97.1 F (36.2 C) (Oral)   Ht 4\' 11"  (1.499 m)   Wt 165 lb 3.2 oz (74.9 kg)   SpO2 100%   BMI 33.37 kg/m   SpO2: 100 % O2 Device: None (Room air)  GENERAL: Overweight woman in no acute distress.  Fully ambulatory. HEAD: Normocephalic, atraumatic.  EYES: No scleral icterus.  Left corneal opacity (transplant). MOUTH: And requirement NECK: Supple. No thyromegaly. Trachea midline. No JVD.  No adenopathy. PULMONARY: Good  air entry bilaterally.  Coarse otherwise, no adventitious sounds. CARDIOVASCULAR: S1 and S2. Regular rate and rhythm.  No rubs, murmurs or gallops heard. ABDOMEN: Obese, benign. MUSCULOSKELETAL: No joint deformity, mild clubbing of fingers, no edema.  NEUROLOGIC: No focal deficit, no gait disturbance.  Speech is fluent. SKIN: Intact,warm,dry.  On limited exam no rashes PSYCH: Mood and behavior normal.      Assessment & Plan:     ICD-10-CM   1. COPD suggested by initial evaluation (HCC)  J44.9 Pulmonary Function Test [facility] Only   Obtain PFTs Continue Trelegy Continue as needed albuterol    2. OSA (obstructive sleep apnea)  G47.33    Classified as moderate AHI 23.9 with low sats 74% Continue auto CPAP    3. Multiple lung nodules on CT  R91.8    Have been stable Needs to reenroll in lung cancer screening program     Orders Placed This Encounter  Procedures   Pulmonary Function Test [facility] Only    Standing Status:   Future    Number of Occurrences:   1    Standing  Expiration Date:   07/07/2022    Scheduling Instructions:     Next available    Order Specific Question:   Full PFT: includes the following: basic spirometry, spirometry pre & post bronchodilator, diffusion capacity (DLCO), lung volumes    Answer:   Full PFT    Order Specific Question:   This test can only be performed at    Answer:   [facility]   Will see the patient in follow-up in 3 months time she is to contact us prior to that time should any new difficulties arise.  Gailen Shelter, MD Advanced Bronchoscopy P[facility]    *This note was dictated using voice recognition software/Dragon.  Despite best efforts to proofread, errors can occur which can change the meaning. Any transcriptional errors that result from this process are unintentional and may not be fully corrected at the time of dictation.

## 2021-07-07 NOTE — Telephone Encounter (Signed)
Returned call to patient to scheduled LDCT. Unable to reach.  Rang then disconnected.

## 2021-07-07 NOTE — Patient Instructions (Signed)
We are going to get breathing test scheduled.  We are going to get you back into the lung cancer screening program.  Continue using your Trelegy and as needed albuterol (rescue inhaler).  We will see you in follow-up in 3 months time call sooner should any new problems arise.

## 2021-07-08 NOTE — Addendum Note (Signed)
Addended by: Abigail Miyamoto D on: 07/08/2021 10:44 AM   Modules accepted: Orders

## 2021-07-08 NOTE — Telephone Encounter (Signed)
Spoke with pt and scheduled LDCT 07/26/21 11:00. Pt verbalized understanding. Nothing further needed.

## 2021-07-12 ENCOUNTER — Ambulatory Visit: Payer: Medicare Other

## 2021-07-19 ENCOUNTER — Ambulatory Visit (INDEPENDENT_AMBULATORY_CARE_PROVIDER_SITE_OTHER): Payer: Medicare Other

## 2021-07-19 DIAGNOSIS — Z1231 Encounter for screening mammogram for malignant neoplasm of breast: Secondary | ICD-10-CM | POA: Diagnosis not present

## 2021-07-19 DIAGNOSIS — Z0000 Encounter for general adult medical examination without abnormal findings: Secondary | ICD-10-CM

## 2021-07-19 DIAGNOSIS — Z780 Asymptomatic menopausal state: Secondary | ICD-10-CM

## 2021-07-19 NOTE — Patient Instructions (Signed)
Julie Wall , Thank you for taking time to come for your Medicare Wellness Visit. I appreciate your ongoing commitment to your health goals. Please review the following plan we discussed and let me know if I can assist you in the future.   Screening recommendations/referrals: Colonoscopy: done 2012. Cologuard ordered 06/29/21 Mammogram: done 09/17/19. Please call [phone] to schedule your mammogram and bone density screening.  Bone Density: done 2012 Recommended yearly ophthalmology/optometry visit for glaucoma screening and checkup Recommended yearly dental visit for hygiene and checkup  Vaccinations: Influenza vaccine: done 02/22/21 Pneumococcal vaccine: done 09/05/19 Tdap vaccine: done 01/25/12 Shingles vaccine: Shingrix discussed. Please contact your pharmacy for coverage information.  Covid-19: declined  Advanced directives: Advance directive discussed with you today. Even though you declined this today please call our office should you change your mind and we can give you the proper paperwork for you to fill out.   Conditions/risks identified: If you wish to quit smoking, help is available. For free tobacco cessation program offerings call the [facility] at [phone] or Live Well Line at 775-734-0322. You may also visit [email or web address] or email [email or web address] for more information on other programs.   Next appointment: Follow up in one year for your annual wellness visit    Preventive Care 65 Years and Older, Female Preventive care refers to lifestyle choices and visits with your health care provider that can promote health and wellness. What does preventive care include? A yearly physical exam. This is also called an annual well check. Dental exams once or twice a year. Routine eye exams. Ask your health care provider how often you should have your eyes checked. Personal lifestyle choices, including: Daily care of your teeth and gums. Regular  physical activity. Eating a healthy diet. Avoiding tobacco and drug use. Limiting alcohol use. Practicing safe sex. Taking low-dose aspirin every day. Taking vitamin and mineral supplements as recommended by your health care provider. What happens during an annual well check? The services and screenings done by your health care provider during your annual well check will depend on your age, overall health, lifestyle risk factors, and family history of disease. Counseling  Your health care provider may ask you questions about your: Alcohol use. Tobacco use. Drug use. Emotional well-being. Home and relationship well-being. Sexual activity. Eating habits. History of falls. Memory and ability to understand (cognition). Work and work Statistician. Reproductive health. Screening  You may have the following tests or measurements: Height, weight, and BMI. Blood pressure. Lipid and cholesterol levels. These may be checked every 5 years, or more frequently if you are over 21 years old. Skin check. Lung cancer screening. You may have this screening every year starting at age 32 if you have a 30-pack-year history of smoking and currently smoke or have quit within the past 15 years. Fecal occult blood test (FOBT) of the stool. You may have this test every year starting at age 63. Flexible sigmoidoscopy or colonoscopy. You may have a sigmoidoscopy every 5 years or a colonoscopy every 10 years starting at age 8. Hepatitis C blood test. Hepatitis B blood test. Sexually transmitted disease (STD) testing. Diabetes screening. This is done by checking your blood sugar (glucose) after you have not eaten for a while (fasting). You may have this done every 1-3 years. Bone density scan. This is done to screen for osteoporosis. You may have this done starting at age 32. Mammogram. This may be done every 1-2 years. Talk to your health care provider  about how often you should have regular mammograms. Talk  with your health care provider about your test results, treatment options, and if necessary, the need for more tests. Vaccines  Your health care provider may recommend certain vaccines, such as: Influenza vaccine. This is recommended every year. Tetanus, diphtheria, and acellular pertussis (Tdap, Td) vaccine. You may need a Td booster every 10 years. Zoster vaccine. You may need this after age 65. Pneumococcal 13-valent conjugate (PCV13) vaccine. One dose is recommended after age 46. Pneumococcal polysaccharide (PPSV23) vaccine. One dose is recommended after age 49. Talk to your health care provider about which screenings and vaccines you need and how often you need them. This information is not intended to replace advice given to you by your health care provider. Make sure you discuss any questions you have with your health care provider. Document Released: 06/18/2015 Document Revised: 02/09/2016 Document Reviewed: 03/23/2015 Elsevier Interactive Patient Education  2017 [employer] Prevention in the Home Falls can cause injuries. They can happen to people of all ages. There are many things you can do to make your home safe and to help prevent falls. What can I do on the outside of my home? Regularly fix the edges of walkways and driveways and fix any cracks. Remove anything that might make you trip as you walk through a door, such as a raised step or threshold. Trim any bushes or trees on the path to your home. Use bright outdoor lighting. Clear any walking paths of anything that might make someone trip, such as rocks or tools. Regularly check to see if handrails are loose or broken. Make sure that both sides of any steps have handrails. Any raised decks and porches should have guardrails on the edges. Have any leaves, snow, or ice cleared regularly. Use sand or salt on walking paths during winter. Clean up any spills in your garage right away. This includes oil or grease  spills. What can I do in the bathroom? Use night lights. Install grab bars by the toilet and in the tub and shower. Do not use towel bars as grab bars. Use non-skid mats or decals in the tub or shower. If you need to sit down in the shower, use a plastic, non-slip stool. Keep the floor dry. Clean up any water that spills on the floor as soon as it happens. Remove soap buildup in the tub or shower regularly. Attach bath mats securely with double-sided non-slip rug tape. Do not have throw rugs and other things on the floor that can make you trip. What can I do in the bedroom? Use night lights. Make sure that you have a light by your bed that is easy to reach. Do not use any sheets or blankets that are too big for your bed. They should not hang down onto the floor. Have a firm chair that has side arms. You can use this for support while you get dressed. Do not have throw rugs and other things on the floor that can make you trip. What can I do in the kitchen? Clean up any spills right away. Avoid walking on wet floors. Keep items that you use a lot in easy-to-reach places. If you need to reach something above you, use a strong step stool that has a grab bar. Keep electrical cords out of the way. Do not use floor polish or wax that makes floors slippery. If you must use wax, use non-skid floor wax. Do not have throw rugs and  other things on the floor that can make you trip. What can I do with my stairs? Do not leave any items on the stairs. Make sure that there are handrails on both sides of the stairs and use them. Fix handrails that are broken or loose. Make sure that handrails are as [place] as the stairways. Check any carpeting to make sure that it is firmly attached to the stairs. Fix any carpet that is loose or worn. Avoid having throw rugs at the top or bottom of the stairs. If you do have throw rugs, attach them to the floor with carpet tape. Make sure that you have a light switch at the  top of the stairs and the bottom of the stairs. If you do not have them, ask someone to add them for you. What else can I do to help prevent falls? Wear shoes that: Do not have high heels. Have rubber bottoms. Are comfortable and fit you well. Are closed at the toe. Do not wear sandals. If you use a stepladder: Make sure that it is fully opened. Do not climb a closed stepladder. Make sure that both sides of the stepladder are locked into place. Ask someone to hold it for you, if possible. Clearly mark and make sure that you can see: Any grab bars or handrails. First and last steps. Where the edge of each step is. Use tools that help you move around (mobility aids) if they are needed. These include: Canes. Walkers. Scooters. Crutches. Turn on the lights when you go into a dark area. Replace any light bulbs as soon as they burn out. Set up your furniture so you have a clear path. Avoid moving your furniture around. If any of your floors are uneven, fix them. If there are any pets around you, be aware of where they are. Review your medicines with your doctor. Some medicines can make you feel dizzy. This can increase your chance of falling. Ask your doctor what other things that you can do to help prevent falls. This information is not intended to replace advice given to you by your health care provider. Make sure you discuss any questions you have with your health care provider. Document Released: 03/18/2009 Document Revised: 10/28/2015 Document Reviewed: 06/26/2014 Elsevier Interactive Patient Education  2017 [employer].

## 2021-07-19 NOTE — Progress Notes (Signed)
Subjective:   Julie Wall is a 66 y.o. female who presents for Medicare Annual (Subsequent) preventive examination.  Virtual Visit via Telephone Note  I connected with  [name] on 07/19/21 at  9:20 AM EST by telephone and verified that I am speaking with the correct person using two identifiers.  Location: Patient: home Provider: [place] Persons participating in the virtual visit: [name]   I discussed the limitations, risks, security and privacy concerns of performing an evaluation and management service by telephone and the availability of in person appointments. The patient expressed understanding and agreed to proceed.  Interactive audio and video telecommunications were attempted between this nurse and patient, however failed, due to patient having technical difficulties OR patient did not have access to video capability.  We continued and completed visit with audio only.  Some vital signs may be absent or patient reported.   [name], LPN   Review of Systems     Cardiac Risk Factors include: advanced age (>46mn, >>82women);diabetes mellitus;dyslipidemia;obesity (BMI >30kg/m2);smoking/ tobacco exposure     Objective:    There were no vitals filed for this visit. There is no height or weight on file to calculate BMI.  Advanced Directives 07/19/2021 07/01/2020 06/12/2019 05/23/2018 03/16/2017 12/05/2016 06/14/2016  Does Patient Have a Medical Advance Directive? No No No No No No No  Would patient like information on creating a medical advance directive? No - Patient declined No - Patient declined No - Patient declined No - Patient declined - - -    Current Medications (verified) Outpatient Encounter Medications as of 07/19/2021  Medication Sig   ACCU-CHEK AVIVA PLUS test strip CHECK BLOOD SUGAR ONCE DAILY   albuterol (VENTOLIN HFA) 108 (90 Base) MCG/ACT inhaler Inhale 2 puffs into the lungs every 6 (six) hours as needed for wheezing or shortness of  breath.   Alcohol Swabs (ALCOHOL PREP) 70 % PADS CHECK BLOOD SUGAR 2 TIMES A DAY   AquaLance Lancets 30G MISC CHECK BLOOD SUGAR 2 TIMES A DAY   aspirin 81 MG chewable tablet Chew 1 tablet by mouth daily.   atorvastatin (LIPITOR) 40 MG tablet TAKE 1 TABLET BY MOUTH DAILY (IN PLACE OF SIMVASTATIN)   Baclofen 5 MG TABS Take 1 tablet by mouth 2 (two) times daily as needed.   Cholecalciferol (VITAMIN D) 50 MCG (2000 UT) CAPS Take 1 capsule (2,000 Units total) by mouth daily.   Cyanocobalamin (B-12) 1000 MCG SUBL Place 1 tablet under the tongue daily.   dorzolamide-timolol (COSOPT) 22.3-6.8 MG/ML ophthalmic solution Place 1 drop into both eyes 2 (two) times daily.    Fluticasone-Umeclidin-Vilant (TRELEGY ELLIPTA) 100-62.5-25 MCG/INH AEPB Inhale 1 puff into the lungs daily.   folic acid (FOLVITE) 1 MG tablet Take 1 mg by mouth daily.    gabapentin (NEURONTIN) 300 MG capsule Take 1 capsule by mouth 2 (two) times daily.   HYDROcodone-acetaminophen (NORCO/VICODIN) 5-325 MG tablet Take 2 tablets by mouth 2 (two) times daily as needed for moderate pain.   JARDIANCE 25 MG TABS tablet TAKE 1 TABLET BY MOUTH DAILY   Lancet Devices (SIMPLE DIAGNOSTICS LANCING DEV) MISC Check blood sugar once daily fasting, and up to 4 times daily PRN.   metFORMIN (GLUCOPHAGE-XR) 750 MG 24 hr tablet Take 2 tablets (1,500 mg total) by mouth daily with breakfast.   methotrexate (RHEUMATREX) 2.5 MG tablet Take 7.5 mg by mouth every Friday.   Ropinirole HCl 6 MG TB24 Take 1 tablet by mouth at bedtime as needed.  triamcinolone ointment (KENALOG) 0.1 % Apply topically 2 (two) times daily.   [DISCONTINUED] POLYTRIM ophthalmic solution Place into the left eye.   [DISCONTINUED] prednisoLONE acetate (PRED FORTE) 1 % ophthalmic suspension    [DISCONTINUED] VIGAMOX 0.5 % ophthalmic solution    No facility-administered encounter medications on file as of 07/19/2021.    Allergies (verified) Penicillins   History: Past Medical  History:  Diagnosis Date   Allergy    ASCUS favor benign 09/2013   negative HPV   Atopic dermatitis    Dermatologist at [facility]   Chronic low back pain    COPD (chronic obstructive pulmonary disease) (HCC)    Decreased exercise tolerance    Dental caries    Diabetes mellitus without complication (HCC)    GERD (gastroesophageal reflux disease)    Hyperlipidemia    Hypertension    Lumbosacral neuritis    Microalbuminuria    Osteopenia    Ovarian failure    Tobacco use    Past Surgical History:  Procedure Laterality Date   CATARACT EXTRACTION Bilateral 1980   CESAREAN SECTION     CORNEAL TRANSPLANT Left    [facility]-Treatment for blindness.   Family History  Problem Relation Age of Onset   Diabetes Mother    Hypertension Mother    Pancreatic cancer Mother    Alcohol abuse Father    Arthritis/Rheumatoid Sister    Heart failure Sister    Diabetes Sister    Kidney disease Sister    Diabetes Brother    Breast cancer Neg Hx    Social History   Socioeconomic History   Marital status: Single    Spouse name: Not on file   Number of children: 3   Years of education: Not on file   Highest education level: High school graduate  Occupational History   Not on file  Tobacco Use   Smoking status: Every Day    Packs/day: 1.00    Years: 46.00    Pack years: 46.00    Types: Cigarettes    Start date: 01/07/1985    Last attempt to quit: 02/01/2020    Years since quitting: 1.4   Smokeless tobacco: Never   Tobacco comments:    8 cigs - 07/07/2021  Vaping Use   Vaping Use: Never used  Substance and Sexual Activity   Alcohol use: No    Alcohol/week: 0.0 standard drinks   Drug use: No   Sexual activity: Not Currently  Other Topics Concern   Not on file  Social History Narrative   Disabled from severe atopic dermatitis   Has 3 children   Lives alone but very connect with family ( sees sister and her children daily)   Also belongs to a church   Social Determinants of Adult nurse Strain: Low Risk    Difficulty of Paying Living Expenses: Not hard at all  Food Insecurity: No Food Insecurity   Worried About Charity fundraiser in the Last Year: Never true   Arboriculturist in the Last Year: Never true  Transportation Needs: No Transportation Needs   Lack of Transportation (Medical): No   Lack of Transportation (Non-Medical): No  Physical Activity: Insufficiently Active   Days of Exercise per Week: 2 days   Minutes of Exercise per Session: 20 min  Stress: No Stress Concern Present   Feeling of Stress : Not at all  Social Connections: Moderately Integrated   Frequency of Communication with Friends and Family: More than  three times a week   Frequency of Social Gatherings with Friends and Family: Once a week   Attends Religious Services: More than 4 times per year   Active Member of [employer] or Organizations: Yes   Attends Music therapist: More than 4 times per year   Marital Status: Never married    Tobacco Counseling Ready to quit: Not Answered Counseling given: Not Answered Tobacco comments: 8 cigs - 07/07/2021   Clinical Intake:  Pre-visit preparation completed: Yes  Pain : No/denies pain     Nutritional Risks: None Diabetes: Yes CBG done?: No Did pt. bring in CBG monitor from home?: No  How often do you need to have someone help you when you read instructions, pamphlets, or other written materials from your doctor or pharmacy?: 1 - Never  Nutrition Risk Assessment:  Has the patient had any N/V/D within the last 2 months?  No  Does the patient have any non-healing wounds?  No  Has the patient had any unintentional weight loss or weight gain?  No   Diabetes:  Is the patient diabetic?  Yes  If diabetic, was a CBG obtained today?  No  Did the patient bring in their glucometer from home?  No  How often do you monitor your CBG's? Needs to replace battery in meter.   Financial Strains and Diabetes  Management:  Are you having any financial strains with the device, your supplies or your medication? No .  Does the patient want to be seen by Chronic Care Management for management of their diabetes?  No  Would the patient like to be referred to a Nutritionist or for Diabetic Management?  No   Diabetic Exams:  Diabetic Eye Exam: Completed 10/18/20. .  Diabetic Foot Exam: Completed 06/29/21.     Interpreter Needed?: No  Information entered by :: [name] LPN   Activities of Daily Living In your present state of health, do you have any difficulty performing the following activities: 07/19/2021 06/29/2021  Hearing? N N  Vision? N N  Difficulty concentrating or making decisions? N N  Walking or climbing stairs? N N  Dressing or bathing? N N  Doing errands, shopping? N N  Preparing Food and eating ? N -  Using the Toilet? N -  In the past six months, have you accidently leaked urine? N -  Do you have problems with loss of bowel control? N -  Managing your Medications? N -  Managing your Finances? N -  Housekeeping or managing your Housekeeping? N -  Some recent data might be hidden    Patient Care Team: Steele Sizer, MD as PCP - General (Family Medicine) Ernesto Rutherford, MD as Referring Physician (Dermatology) Vladimir Crofts, MD as Consulting Physician (Neurology) Germaine Pomfret, [facility] as Pharmacist (Pharmacist)  Indicate any recent Medical Services you may have received from other than [facility] providers in the past year (date may be approximate).     Assessment:   This is a routine wellness examination for Amazin.  Hearing/Vision screen Hearing Screening - Comments::  Pt denies hearing difficulty Vision Screening - Comments:: [facility] screen[facility]  Dietary issues and exercise activities discussed: Current Exercise Habits: Home exercise routine, Type of exercise: walking, Time (Minutes): 15, Frequency (Times/Week): 2, Weekly Exercise  (Minutes/Week): 30, Intensity: Mild, Exercise limited by: None identified   Goals Addressed             This Visit's Progress  Quit Smoking   Not on track    Pt would like to stop smoking completely.       Depression Screen PHQ 2/9 Scores 07/19/2021 06/29/2021 02/04/2021 10/26/2020 10/22/2020 09/29/2020 07/01/2020  PHQ - 2 Score 0 0 0 0 0 0 0  PHQ- 9 Score 0 0 - 0 - - -  Exception Documentation - - - - - - -  Not completed - - - - - - -    Fall Risk Fall Risk  07/19/2021 06/29/2021 02/04/2021 10/26/2020 10/22/2020  Falls in the past year? 1 0 0 0 0  Number falls in past yr: 0 0 0 0 0  Injury with Fall? 0 0 0 0 0  Comment - - - - -  Risk Factor Category  - - - - -  Risk for fall due to : No Fall Risks No Fall Risks No Fall Risks - -  Risk for fall due to: Comment - - - - -  Follow up Falls prevention discussed Falls prevention discussed Falls prevention discussed Falls evaluation completed -    FALL RISK PREVENTION PERTAINING TO THE HOME:  Any stairs in or around the home? Yes  If so, are there any without handrails? No  Home free of loose throw rugs in walkways, pet beds, electrical cords, etc? Yes  Adequate lighting in your home to reduce risk of falls? Yes   ASSISTIVE DEVICES UTILIZED TO PREVENT FALLS:  Life alert? No  Use of a cane, walker or w/c? No  Grab bars in the bathroom? Yes  Shower chair or bench in shower? Yes  Elevated toilet seat or a handicapped toilet? No   TIMED UP AND GO:  Was the test performed? No . Telephonic visit.   Cognitive Function: Normal cognitive status assessed by direct observation by this Nurse Health Advisor. No abnormalities found.       6CIT Screen 06/12/2019 05/23/2018  What Year? 0 points 0 points  What month? 0 points 0 points  What time? 0 points 0 points  Count back from 20 0 points 0 points  Months in reverse 0 points 0 points  Repeat phrase 6 points 4 points  Total Score 6 4    Immunizations Immunization History   Administered Date(s) Administered   Fluad Quad(high Dose 65+) 03/02/2020   Influenza Split 04/09/2012   Influenza, High Dose Seasonal PF 02/22/2021   Influenza, Seasonal, Injecte, Preservative Fre 02/17/2011   Influenza,inj,Quad PF,6+ Mos 07/16/2013, 03/23/2014, 04/01/2015, 03/14/2016, 03/16/2017, 02/19/2018, 05/07/2019   Influenza-Unspecified 07/16/2013, 03/23/2014, 04/01/2015, 03/14/2016, 03/16/2017, 02/19/2018, 05/07/2019   Pneumococcal Conjugate-13 04/01/2015   Pneumococcal Polysaccharide-23 01/26/2010, 09/05/2019   Tdap 01/25/2012   Zoster, Live 01/08/2015    TDAP status: Up to date  Flu Vaccine status: Up to date  Pneumococcal vaccine status: Up to date  Covid-19 vaccine status: Declined, Education has been provided regarding the importance of this vaccine but patient still declined. Advised may receive this vaccine at local pharmacy or Health Dept.or vaccine clinic. Aware to provide a copy of the vaccination record if obtained from local pharmacy or Health Dept. Verbalized acceptance and understanding.  Qualifies for Shingles Vaccine? Yes   Zostavax completed Yes   Shingrix Completed?: No.    Education has been provided regarding the importance of this vaccine. Patient has been advised to call insurance company to determine out of pocket expense if they have not yet received this vaccine. Advised may also receive vaccine at local pharmacy or Health Dept. Verbalized acceptance and  understanding.  Screening Tests Health Maintenance  Topic Date Due   Zoster Vaccines- Shingrix (1 of 2) Never done   COVID-19 Vaccine (1) 08/04/2021 (Originally 02/07/1956)   COLONOSCOPY (Pts 45-93yr Insurance coverage will need to be confirmed)  10/26/2021 (Originally 07/05/2020)   MAMMOGRAM  09/16/2021   OPHTHALMOLOGY EXAM  10/18/2021   URINE MICROALBUMIN  10/22/2021   HEMOGLOBIN A1C  12/18/2021   TETANUS/TDAP  01/24/2022   FOOT EXAM  06/29/2022   Pneumonia Vaccine 66 Years old  Completed    INFLUENZA VACCINE  Completed   DEXA SCAN  Completed   Hepatitis C Screening  Completed   HPV VACCINES  Aged Out    Health Maintenance  Health Maintenance Due  Topic Date Due   Zoster Vaccines- Shingrix (1 of 2) Never done    Colorectal cancer screening: Type of screening: Colonoscopy. Completed 2012. Repeat every 10 years. Pt states she has Cologuard kit at home to complete and return. Ordered 06/29/21.   Mammogram status: Completed 09/17/19. Repeat every year. Ordered today.  Bone Density status: Completed 06/15/10. Results reflect: Bone density results: NORMAL. Repeat every 2 years. Ordered today.   Lung Cancer Screening: (Low Dose CT Chest recommended if Age 67-80years, 30 pack-year currently smoking OR have quit w/in 15years.) does qualify. Scheduled for 07/26/21  Additional Screening:  Hepatitis C Screening: does qualify; Completed 01/27/13  Vision Screening: Recommended annual ophthalmology exams for early detection of glaucoma and other disorders of the eye. Is the patient up to date with their annual eye exam?  Yes  Who is the provider or what is the name of the [facility] which the patient [facility]   Dental Screening: Recommended annual dental exams for proper oral hygiene  Community Resource Referral / Chronic Care Management: CRR required this visit?  No   CCM required this visit?  No      Plan:     I have personally reviewed and noted the following in the patients chart:   Medical and social history Use of alcohol, tobacco or illicit drugs  Current medications and supplements including opioid prescriptions.  Functional ability and status Nutritional status Physical activity Advanced directives List of other physicians Hospitalizations, surgeries, and ER visits in previous 12 months Vitals Screenings to include cognitive, depression, and falls Referrals and appointments  In addition, I have reviewed and discussed  with patient certain preventive protocols, quality metrics, and best practice recommendations. A written personalized care plan for preventive services as well as general preventive health recommendations were provided to patient.   Due to this being a telephonic visit, the after visit summary with patients personalized plan was offered to patient via mail or my-chart. Patient declined at this time.  KClemetine Marker LPN   [id number]/50/[id number]  Nurse Notes: none

## 2021-07-20 ENCOUNTER — Other Ambulatory Visit: Payer: Self-pay | Admitting: Family Medicine

## 2021-07-26 ENCOUNTER — Other Ambulatory Visit: Payer: Self-pay

## 2021-07-26 ENCOUNTER — Ambulatory Visit
Admission: RE | Admit: 2021-07-26 | Discharge: 2021-07-26 | Disposition: A | Discharge status: Home / Self Care | Payer: Medicare Other | Source: Ambulatory Visit | Attending: Acute Care | Admitting: Acute Care

## 2021-07-26 DIAGNOSIS — F17210 Nicotine dependence, cigarettes, uncomplicated: Secondary | ICD-10-CM | POA: Insufficient documentation

## 2021-07-26 DIAGNOSIS — Z87891 Personal history of nicotine dependence: Secondary | ICD-10-CM | POA: Diagnosis present

## 2021-07-28 ENCOUNTER — Telehealth: Payer: Self-pay | Admitting: Acute Care

## 2021-07-29 ENCOUNTER — Telehealth: Payer: Self-pay | Admitting: Acute Care

## 2021-07-29 ENCOUNTER — Ambulatory Visit: Payer: Self-pay | Admitting: *Deleted

## 2021-07-29 DIAGNOSIS — F17210 Nicotine dependence, cigarettes, uncomplicated: Secondary | ICD-10-CM

## 2021-07-29 DIAGNOSIS — Z87891 Personal history of nicotine dependence: Secondary | ICD-10-CM

## 2021-07-29 NOTE — Telephone Encounter (Signed)
°  Chief Complaint: wheezing Symptoms: cough, congestion, wheezing Frequency: started Wednesday Pertinent Negatives: Patient denies fever Disposition: [] ED /[x] Urgent Care (no appt availability in office) / [] Appointment(In office/virtual)/ []  [facility]/ [] Home Care/ [] Refused Recommended Disposition /[] [facility]/ []  Follow-up with PCP Additional Notes: call to office- no appointment available- advised virtual/UC

## 2021-07-29 NOTE — Telephone Encounter (Signed)
Reason for Disposition  [1] Longstanding difficulty breathing (e.g., CHF, COPD, emphysema) AND [2] WORSE than normal  Answer Assessment - Initial Assessment Questions 1. RESPIRATORY STATUS: "Describe your breathing?" (e.g., wheezing, shortness of breath, unable to speak, severe coughing)      wheezing 2. ONSET: "When did this breathing problem begin?"      Started Wednesday 3. PATTERN "Does the difficult breathing come and go, or has it been constant since it started?"      Comes and goes 4. SEVERITY: "How bad is your breathing?" (e.g., mild, moderate, severe)    - MILD: No SOB at rest, mild SOB with walking, speaks normally in sentences, can lie down, no retractions, pulse < 100.    - MODERATE: SOB at rest, SOB with minimal exertion and prefers to sit, cannot lie down flat, speaks in phrases, mild retractions, audible wheezing, pulse 100-120.    - SEVERE: Very SOB at rest, speaks in single words, struggling to breathe, sitting hunched forward, retractions, pulse > 120      Mild- chest was tight yesterday 5. RECURRENT SYMPTOM: "Have you had difficulty breathing before?" If Yes, ask: "When was the last time?" and "What happened that time?"      COPD- cough, congestion- treated 6. CARDIAC HISTORY: "Do you have any history of heart disease?" (e.g., heart attack, angina, bypass surgery, angioplasty)      no 7. LUNG HISTORY: "Do you have any history of lung disease?"  (e.g., pulmonary embolus, asthma, emphysema)     COPD 8. CAUSE: "What do you think is causing the breathing problem?"      Cough,congestion- weather change 9. OTHER SYMPTOMS: "Do you have any other symptoms? (e.g., dizziness, runny nose, cough, chest pain, fever)     Cough, chest tightness- congestion 10. O2 SATURATION MONITOR:  "Do you use an oxygen saturation monitor (pulse oximeter) at home?" If Yes, "What is your reading (oxygen level) today?" "What is your usual oxygen saturation reading?" (e.g., 95%)       *No Answer* 11.  PREGNANCY: "Is there any chance you are pregnant?" "When was your last menstrual period?"       *No Answer* 12. TRAVEL: "Have you traveled out of the country in the last month?" (e.g., travel history, exposures)       *No Answer*  Protocols used: Breathing Difficulty-A-AH

## 2021-07-29 NOTE — Telephone Encounter (Signed)
See telephone note 07/29/21

## 2021-07-29 NOTE — Telephone Encounter (Signed)
Noted  

## 2021-07-29 NOTE — Telephone Encounter (Signed)
I just recently saw her and change her inhalation therapy.  She has scheduled follow-up with me does not need to be seen any sooner.  She was asymptomatic at the time.

## 2021-07-29 NOTE — Telephone Encounter (Signed)
I have called the patient with the results of her low dose CT Chest. UI explained that her scan was read as a Lung  RADS 3, nodules that are probably benign findings, short term follow up suggested: includes nodules with a low likelihood of becoming a clinically active cancer. Radiology recommends a 6 month repeat LDCT follow up. New 5.8 mm nodular density in the anteromedial RLL, possibly an impaction. I explained  that we would like to do a follow up scan in 6 months instead of waiting a full year to re-evaluate the nodule. She is in agreement with this plan.  Angelique Blonder, please fax results to PCP and order 6 month follow up CT Chest. She does have notation of bronchiectasis and ? Fibrotic changes that we will have her follow up with Dr. Marcos Eke about.    Dr. Reece Agar, if you want to see her for the bronchiectasis and fibrotic changes before the 6 month follow up , please schedule her as you feel is clinically appropriate. Thanks so much

## 2021-08-01 NOTE — Addendum Note (Signed)
Addended by: Doroteo Glassman D on: 08/01/2021 11:36 AM   Modules accepted: Orders

## 2021-08-01 NOTE — Telephone Encounter (Signed)
CT results faxed to PCP. Order placed for 6 mth nodule f/u CT.  ?

## 2021-08-15 ENCOUNTER — Telehealth: Payer: Self-pay

## 2021-08-15 NOTE — Telephone Encounter (Signed)
Patient is aware of date/time of covid test. Nothing further needed.   

## 2021-08-16 ENCOUNTER — Other Ambulatory Visit
Admission: RE | Admit: 2021-08-16 | Discharge: 2021-08-16 | Disposition: A | Discharge status: Home / Self Care | Payer: Medicare Other | Source: Ambulatory Visit | Attending: Pulmonary Disease | Admitting: Pulmonary Disease

## 2021-08-16 ENCOUNTER — Other Ambulatory Visit: Payer: Self-pay

## 2021-08-16 DIAGNOSIS — Z20822 Contact with and (suspected) exposure to covid-19: Secondary | ICD-10-CM | POA: Insufficient documentation

## 2021-08-16 DIAGNOSIS — Z01812 Encounter for preprocedural laboratory examination: Secondary | ICD-10-CM | POA: Diagnosis present

## 2021-08-16 LAB — SARS CORONAVIRUS 2 (TAT 6-24 HRS): SARS Coronavirus 2: NEGATIVE

## 2021-08-17 ENCOUNTER — Ambulatory Visit: Payer: Medicare Other | Attending: Pulmonary Disease

## 2021-08-17 DIAGNOSIS — Z7951 Long term (current) use of inhaled steroids: Secondary | ICD-10-CM | POA: Diagnosis not present

## 2021-08-17 DIAGNOSIS — J449 Chronic obstructive pulmonary disease, unspecified: Secondary | ICD-10-CM | POA: Diagnosis present

## 2021-08-17 DIAGNOSIS — F17210 Nicotine dependence, cigarettes, uncomplicated: Secondary | ICD-10-CM | POA: Diagnosis not present

## 2021-08-17 DIAGNOSIS — J984 Other disorders of lung: Secondary | ICD-10-CM | POA: Insufficient documentation

## 2021-08-17 MED ORDER — ALBUTEROL SULFATE (2.5 MG/3ML) 0.083% IN NEBU
2.5000 mg | INHALATION_SOLUTION | Freq: Once | RESPIRATORY_TRACT | Status: AC
Start: 2021-08-17 — End: 2021-08-17
  Administered 2021-08-17: 2.5 mg via RESPIRATORY_TRACT
  Filled 2021-08-17: qty 3

## 2021-08-30 ENCOUNTER — Telehealth: Payer: Self-pay

## 2021-08-30 NOTE — Progress Notes (Signed)
? ? ?  Chronic Care Management ?Pharmacy Assistant  ? ?Name: Julie Wall  MRN: [id number] DOB: [birth year] ? ?Patient called to be reminded of her telephone appointment with Junius Argyle, CPP on 08/31/2021 @ 1500 ? ?Patient aware of appointment date, time, and type of appointment (either telephone or in person). Patient aware to have/bring all medications, supplements, blood pressure and/or blood sugar logs to visit. ? ?Questions: ?Are there any concerns you would like to discuss during your office visit? Nothing at this time ? ?Are you having any problems obtaining your medications? Not at this time ? ?Star Rating Drug: ?Atorvastatin 40 mg last filled on 06/13/2021 for a 90-Day supply with [place] ?Metformin 750 mg last filled on 08/29/2021 for a 30-Day supply with [facility] ?Jardiance 25 mg last filled on 06/13/2021 for a 90-Day supply with [facility] ? ?Any gaps in medications fill history? None ? ?Care Gaps: ?COVID-19 Vaccine ?Zoster Vaccine ?Urine Microalbumin ? ? ?Lynann Bologna, CPA/CMA ?Clinical Pharmacist Assistant ?Phone: [phone]  ? ?

## 2021-08-31 ENCOUNTER — Ambulatory Visit (INDEPENDENT_AMBULATORY_CARE_PROVIDER_SITE_OTHER): Payer: Medicare Other

## 2021-08-31 DIAGNOSIS — E1169 Type 2 diabetes mellitus with other specified complication: Secondary | ICD-10-CM

## 2021-08-31 NOTE — Progress Notes (Signed)
? ?Chronic Care Management ?Pharmacy Note ? ?09/01/2021 ?Name:  Julie Wall MRN:  [id number] DOB:  12/30/1955 ? ?Summary: ?Patient presents for CCM follow-up. -Counseled on carbohydrate counseling extensively and incorporating more non-starchy vegetables  ? ?Recommendations/Changes made from today's visit: ?Continue current medications ? ?Plan: ?CPP follow-up 1 month ? ?Subjective: ?Julie Wall is an 66 y.o. year old female who is a primary patient of Sowles, Drue Stager, MD.  The CCM team was consulted for assistance with disease management and care coordination needs.   ? ?Engaged with patient by telephone for follow up visit in response to provider referral for pharmacy case management and/or care coordination services.  ? ?Consent to Services:  ?The patient was given information about Chronic Care Management services, agreed to services, and gave verbal consent prior to initiation of services.  Please see initial visit note for detailed documentation.  ? ?Patient Care Team: ?Steele Sizer, MD as PCP - General (Family Medicine) ?Ernesto Rutherford, MD as Referring Physician (Dermatology) ?Vladimir Crofts, MD as Consulting Physician (Neurology) ?Germaine Pomfret, [facility] as Pharmacist (Pharmacist) ? ?Recent office visits: ?06/29/21: Patient presented to Dr. Ancil Boozer for follow-up. Metformin increased to 1500 mg.  ?  ?02/04/2021 Serafina Royals, FNP (PCP Video Visit) for COVID Positive- Started Benzonatate 100 mg twice daily ? ?Recent consult visits: ?07/07/21: Patient presented to Dr. Patsey Berthold (Pulnology) for follow-up.  ? ?Hospital visits: ?None in previous 6 months ? ? ?Objective: ? ?Lab Results  ?Component Value Date  ? CREATININE 0.56 10/22/2020  ? BUN 12 10/22/2020  ? GFRNONAA 97 10/22/2020  ? GFRAA 113 10/22/2020  ? NA 140 10/22/2020  ? K 4.1 10/22/2020  ? CALCIUM 9.4 10/22/2020  ? CO2 25 10/22/2020  ? GLUCOSE 120 (H) 10/22/2020  ? ? ?Lab Results  ?Component Value Date/Time  ? HGBA1C 8.4 06/20/2021 12:00 AM  ? HGBA1C  7.6 (A) 02/22/2021 10:29 AM  ? HGBA1C 6.8 (A) 10/22/2020 10:27 AM  ? HGBA1C 7.3 (A) 10/21/2018 09:51 AM  ? HGBA1C 7.2 (A) 04/19/2018 11:35 AM  ? HGBA1C 6.5 (A) 01/16/2018 10:56 AM  ? MICROALBUR 0.2 10/22/2020 11:03 AM  ? MICROALBUR 0.2 09/05/2019 11:08 AM  ? MICROALBUR negative 01/16/2018 10:57 AM  ? MICROALBUR 20 12/13/2016 10:24 AM  ?  ?Last diabetic Eye exam:  ?Lab Results  ?Component Value Date/Time  ? HMDIABEYEEXA No Retinopathy 10/18/2020 12:00 AM  ?  ?Last diabetic Foot exam:  ?Lab Results  ?Component Value Date/Time  ? HMDIABFOOTEX normal 08/04/2015 12:00 AM  ?  ? ?Lab Results  ?Component Value Date  ? CHOL 92 10/22/2020  ? HDL 45 (L) 10/22/2020  ? [place] 34 10/22/2020  ? TRIG 46 10/22/2020  ? CHOLHDL 2.0 10/22/2020  ? ? ? ?  Latest Ref Rng & Units 10/22/2020  ? 11:03 AM 02/10/2020  ?  6:10 AM 02/09/2020  ?  4:21 AM  ?Hepatic Function  ?Total Protein 6.1 - 8.1 g/dL 6.7   7.0   7.2    ?Albumin 3.5 - 5.0 g/dL  2.7   2.8    ?AST 10 - 35 U/L 10   27   41    ?ALT 6 - 29 U/L _0 ?Alk Phosphatase 38 - 126 U/L  80   84    ?Total Bilirubin 0.2 - 1.2 mg/dL 0.3   0.6   0.8    ? ? ?Lab Results  ?Component Value Date/Time  ? TSH 0.94 10/22/2020 11:03 AM  ?  TSH 1.22 06/20/2017 12:13 PM  ? ? ? ?  Latest Ref Rng & Units 10/22/2020  ? 11:03 AM 02/10/2020  ?  6:10 AM 02/09/2020  ?  4:21 AM  ?CBC  ?WBC 3.8 - 10.8 Thousand/uL 8.1   13.8   17.0    ?Hemoglobin 11.7 - 15.5 g/dL 14.9   14.2   12.8    ?Hematocrit 35.0 - 45.0 % 43.6   41.9   37.1    ?Platelets 140 - 400 Thousand/uL 205   316   325    ? ? ?Lab Results  ?Component Value Date/Time  ? VD25OH 30 10/22/2020 11:03 AM  ? VD25OH 15 (L) 10/19/2017 09:49 AM  ? ? ?Clinical ASCVD: No  ?The ASCVD Risk score (Arnett DK, et al., 2019) failed to calculate for the following reasons: ?  The valid total cholesterol range is 130 to 320 mg/dL   ? ? ?  07/19/2021  ?  9:31 AM 06/29/2021  ? 11:25 AM 02/04/2021  ?  8:04 AM  ?Depression screen PHQ 2/9  ?Decreased Interest 0 0 0  ?Down,  Depressed, Hopeless 0 0 0  ?PHQ - 2 Score 0 0 0  ?Altered sleeping 0 0   ?Tired, decreased energy 0 0   ?Change in appetite 0 0   ?Feeling bad or failure about yourself  0 0   ?Trouble concentrating 0 0   ?Moving slowly or fidgety/restless 0 0   ?Suicidal thoughts 0 0   ?PHQ-9 Score 0 0   ?  ?Social History  ? ?Tobacco Use  ?Smoking Status Every Day  ? Packs/day: 1.00  ? Years: 46.00  ? Pack years: 46.00  ? Types: Cigarettes  ? Start date: 01/07/1985  ? Last attempt to quit: 02/01/2020  ? Years since quitting: 1.5  ?Smokeless Tobacco Never  ?Tobacco Comments  ? 8 cigs - 07/07/2021  ? ?BP Readings from Last 3 Encounters:  ?07/07/21 100/60  ?06/29/21 126/68  ?02/22/21 118/74  ? ?Pulse Readings from Last 3 Encounters:  ?07/07/21 69  ?06/29/21 86  ?02/22/21 91  ? ?Wt Readings from Last 3 Encounters:  ?07/26/21 162 lb (73.5 kg)  ?07/07/21 165 lb 3.2 oz (74.9 kg)  ?06/29/21 167 lb (75.8 kg)  ? ?BMI Readings from Last 3 Encounters:  ?07/26/21 32.72 kg/m?  ?07/07/21 33.37 kg/m?  ?06/29/21 33.73 kg/m?  ? ? ?Assessment/Interventions: Review of patient past medical history, allergies, medications, health status, including review of consultants reports, laboratory and other test data, was performed as part of comprehensive evaluation and provision of chronic care management services.  ? ?SDOH:  (Social Determinants of Health) assessments and interventions performed: Yes ? ? ?SDOH Screenings  ? ?Alcohol Screen: Low Risk   ? Last Alcohol Screening Score (AUDIT): 0  ?Depression (PHQ2-9): Low Risk   ? PHQ-2 Score: 0  ?Financial Resource Strain: Low Risk   ? Difficulty of Paying Living Expenses: Not hard at all  ?Food Insecurity: No Food Insecurity  ? Worried About Charity fundraiser in the Last Year: Never true  ? Ran Out of Food in the Last Year: Never true  ?Housing: Low Risk   ? Last Housing Risk Score: 0  ?Physical Activity: Insufficiently Active  ? Days of Exercise per Week: 2 days  ? Minutes of Exercise per Session: 20 min   ?Social Connections: Moderately Integrated  ? Frequency of Communication with Friends and Family: More than three times a week  ? Frequency of Social Gatherings with Friends  and Family: Once a week  ? Attends Religious Services: More than 4 times per year  ? Active Member of Clubs or Organizations: Yes  ? Attends Archivist Meetings: More than 4 times per year  ? Marital Status: Never married  ?Stress: No Stress Concern Present  ? Feeling of Stress : Not at all  ?Tobacco Use: High Risk  ? Smoking Tobacco Use: Every Day  ? Smokeless Tobacco Use: Never  ? Passive Exposure: Not on file  ?Transportation Needs: No Transportation Needs  ? Lack of Transportation (Medical): No  ? Lack of Transportation (Non-Medical): No  ? ? ?CCM Care Plan ? ?Allergies  ?Allergen Reactions  ? Penicillins Rash  ? ? ?Medications Reviewed Today   ? ? Reviewed by [name], LPN (Licensed Practical Nurse) on 07/19/21 at 0930  Med List Status: <None>  ? ?Medication Order Taking? Sig Documenting Provider Last Dose Status Informant  ?ACCU-CHEK AVIVA PLUS test strip [id number] Yes CHECK BLOOD SUGAR ONCE DAILY Steele Sizer, MD Taking Active   ?albuterol (VENTOLIN HFA) 108 (90 Base) MCG/ACT inhaler [id number] Yes Inhale 2 puffs into the lungs every 6 (six) hours as needed for wheezing or shortness of breath. Tyler Pita, MD Taking Active   ?Alcohol Swabs (ALCOHOL PREP) 70 % PADS [id number] Yes CHECK BLOOD SUGAR 2 TIMES A Eloise Harman, MD Taking Active   ?AquaLance Lancets 30G MISC [id number] Yes CHECK BLOOD SUGAR 2 TIMES A DAY Sowles, Drue Stager, MD Taking Active   ?aspirin 81 MG chewable tablet [id number] Yes Chew 1 tablet by mouth daily. [provider] Taking Active Other  ?         ?Med Note Netty Starring Feb 05, 2020  8:40 AM)    ?atorvastatin (LIPITOR) 40 MG tablet [id number] Yes TAKE 1 TABLET BY MOUTH DAILY (IN PLACE OF SIMVASTATIN) Steele Sizer, MD Taking Active   ?Baclofen 5 MG TABS [id number] Yes Take  1 tablet by mouth 2 (two) times daily as needed. [provider] Taking Active   ?Cholecalciferol (VITAMIN D) 50 MCG (2000 UT) CAPS [id number] Yes Take 1 capsule (2,000 Units total) by mouth daily. Sowles, [employer]

## 2021-09-01 NOTE — Patient Instructions (Addendum)
Visit Information ?It was great speaking with you today!  Please let me know if you have any questions about our visit. ? ? Goals Addressed   ? ?  ?  ?  ?  ? This Visit's Progress  ?  Monitor and Manage My Blood Sugar-Diabetes Type 2   On track  ?  Timeframe:  [place] Goal ?Priority:  High ?Start Date: 03/02/21                            ?Expected End Date: 03/02/22                     ? ?Follow Up within 90 days ?  ?- check blood sugar at prescribed times ?- check blood sugar before and after exercise ?- check blood sugar if I feel it is too high or too low ?- take the blood sugar meter to all doctor visits  ?  ?Why is this important?   ?Checking your blood sugar at home helps to keep it from getting very high or very low.  ?Writing the results in a diary or log helps the doctor know how to care for you.  ?Your blood sugar log should have the time, date and the results.  ?Also, write down the amount of insulin or other medicine that you take.  ?Other information, like what you ate, exercise done and how you were feeling, will also be helpful.   ?  ?Notes:  ?  ? ?  ? ? ?Patient Care Plan: General Pharmacy (Adult)  ?  ? ?Problem Identified: Hypertension, Hyperlipidemia, Diabetes, Coronary Artery Disease, and GERD   ?Priority: High  ?  ? ?[place] Goal: Patient-Specific Goal   ?Start Date: 03/02/2021  ?Expected End Date: 03/02/2022  ?This Visit's Progress: On track  ?Recent Progress: On track  ?Priority: High  ?Note:   ?Current Barriers:  ?No barriers noted ? ?Pharmacist Clinical Goal(s):  ?Patient will maintain control of diabetes as evidenced by A1c less than 8%  through collaboration with PharmD and provider.  ? ?Interventions: ?1:1 collaboration with Alba Cory, MD regarding development and update of comprehensive plan of care as evidenced by provider attestation and co-signature ?Inter-disciplinary care team collaboration (see longitudinal plan of care) ?Comprehensive medication review performed; medication  list updated in electronic medical record ? ?Hypertension (BP goal <130/80) ?-Controlled ?-Current treatment: ?None ?-Medications previously tried: Lisinopril, Losartan   ?-Current home readings: NA ?-Denies hypotensive/hypertensive symptoms ?-Counseled to monitor BP at home weekly, document, and provide log at future appointments ?-Recommended to continue current medication ? ?Hyperlipidemia: (LDL goal < 70) ?-Controlled ?-Current treatment: ?Atorvastatin 40 mg daily  ?-Medications previously tried: NA ?-Recommended to continue current medication ? ?Diabetes (A1c goal <8%) ?-uncontrolled ?-Current medications: ?Jardiance 25 mg daily  ?Metformin XR 750 mg 2 tablets daily  ?-Medications previously tried: NA  ?-Current home glucose readings ?fasting glucose: Has not been monitoring,  ?-Denies hypoglycemic/hyperglycemic symptoms ?-Current meal patterns: Trying to cut back on sweets. Drinks diet soda estimates 3 bottles daily ? -Breakfast: waffle (no syrup) +coffee  ? -Lunch : [place] steak and cheese sandwich + diet soda   ? -Supper: Skipped last, typically has Chicken + cabbage  ? -Drinks: Water.  ?-Current exercise: Walking, Physical therapy (3x weekly) ?-Counseled on carbohydrate counseling extensively and incorporating more non-starchy vegetables  ?-Recommended to continue current medication ? ?Chronic Bronchitis (Goal: control symptoms and prevent exacerbations) ?-Controlled ?-Current treatment  ?Ventolin HFA 2  puffs every 6 hours as needed  ?Trelegy 1 puff daily  ?-Medications previously tried: NA  ?-Exacerbations requiring treatment in last 6 months: None ?-Patient reports consistent use of maintenance inhaler ?-Frequency of rescue inhaler use: <1 x weekly  ?-Counseled on Proper inhaler technique; ?-Recommended to continue current medication ? ?Patient Goals/Self-Care Activities ?Patient will:  ?- check glucose daily before breakfast, document, and provide at future appointments ?check blood pressure weekly,  document, and provide at future appointments ? ?Follow Up Plan: Telephone follow up appointment with care management team member scheduled for:  10/05/2021 at 3:00 PM ?  ? ? ?Patient agreed to services and verbal consent obtained.  ? ?The patient verbalized understanding of instructions, educational materials, and care plan provided today and declined offer to receive copy of patient instructions, educational materials, and care plan.  ? ?Garey Ham, BCACP, CPP ?Clinical Pharmacist Practitioner  ?[facility] ?[phone]  ?

## 2021-09-02 DIAGNOSIS — E785 Hyperlipidemia, unspecified: Secondary | ICD-10-CM | POA: Diagnosis not present

## 2021-09-02 DIAGNOSIS — E1169 Type 2 diabetes mellitus with other specified complication: Secondary | ICD-10-CM

## 2021-09-26 NOTE — Progress Notes (Signed)
Name: Julie Wall   MRN: [id number]    DOB: 05-10-1956   Date:09/27/2021 ? ?     Progress Note ? ?Subjective ? ?Chief Complaint ? ?Follow Up ? ?HPI ? ?Diabetes: A1C went up from 6.8 % to 7.6 % and had [facility] did a home visit on 06/20/21 and A1C was up to 8.4 %, she is now taking 1500 mg of Metformin daily and Jardiance, she is trying to avoid sweets. She also has associated obesity, dyslipidemia,  and microalbuminuria. We stopped Losartan because her bp was low. She denies  polyphagia or polydipsia but has polyuria. She is up to date with eye exam - appointment scheduled. Due for urine micro  ?  ?Eczema: she sees dermatologist at [facility], Dr. Amada Kingfisher  last visit was 05/2021  She is taking Methotrexate 7.5 mg weekly, and also folic acid , she had a flare on her hands and is had to be seen recently but doing well now  ? ?Hyperlipidemia/Atherosclerosis of aorta : she is back  Atorvastatin and aspirin daily , last LDL 34 -at goal,  we will labs today  ?  ?COPD/Intesticial lung disease on CT chest : she had  quit smoking 12/03/2016   but resumed smoking Fall 2019 l, she quit again when she developed COVID-19 in 2021, but resumed smoking again since her sister died this past Summer, she has been smoking 6 cigarettes daily, explained importance of quitting again .She is now on Trelegy but not daily, she sees Dr. Marcos Eke  ?  ?Back pain: . She states right leg is always painful 5/10 when she is very activity  and takes hydrocodone prn  She denies  leg weakness, no bowel or bladder incontinence, she has numbness on left lateral thigh.  She has been seeing Dr. Sherryll Burger who ordered MRI and results below, she was advised to see neurosurgeon and had steroid injections and completed PT - pain is not as intense, it used to be 9/10 , she has 5 hydrocodone tablets left from 3 months ago, we will spread her visit to every 4 months and explained she needs to make it last until follow up and try to gradually come off medication  She continues to have neurogenic claudication  ?  ?IMPRESSION/MRI thoracic spine 09/04/2019  ?  ?Diffuse degenerative disc narrowing and bulging with small ?protrusions. Dominant findings at T11-12 where there is cord ?impingement with flattening and mild T2 hyperintensity by prior ?lumbar MRI. ?  ?IMPRESSION:08/03/2019 ?  ?1. Moderate spinal stenosis L3-4 unchanged. Moderate subarticular ?stenosis bilaterally. ?2. Severe spinal stenosis with progression since the prior study. ?Severe subarticular and foraminal stenosis on the left. ?3. Moderate subarticular stenosis bilaterally L5-S1. ?4. Progressive degenerative changes at T11-12 with significant ?spinal stenosis and mild cord signal abnormality. Thoracic MRI with ?dedicated axial images through this level may be helpful. ?  ?RLS: She sees neurologist, Dr. Sherryll Burger,  and is on higher dose of Requip but symptoms not controlled , advised to keep her regular follow ups with Dr. Sherryll Burger  ?  ?Patient Active Problem List  ? Diagnosis Date Noted  ? Diabetic polyneuropathy associated with type 2 diabetes mellitus (HCC)   ? Weakness   ? Hilar lymphadenopathy 02/05/2020  ? History of COVID-19 02/05/2020  ? Atherosclerosis of aorta (HCC) 05/04/2017  ? Pap smear abnormality of cervix with ASCUS favoring benign 04/23/2017  ? Vitamin D deficiency 12/14/2016  ? Obesity (BMI 30.0-34.9) 12/13/2015  ? Chronic bronchitis (HCC) 05/14/2015  ? Diabetes mellitus (HCC)  05/14/2015  ? Positive H. pylori test 04/05/2015  ? Rapid urease test for Helicobacter pylori infection positive 04/05/2015  ? Umbilical hernia 04/01/2015  ? Exomphalos 04/01/2015  ? RLS (restless legs syndrome) 01/08/2015  ? Type 2 diabetes mellitus with hyperlipidemia (HCC) 01/08/2015  ? Neurogenic claudication 01/08/2015  ? AD (atopic dermatitis) 12/28/2014  ? Chronic LBP 12/28/2014  ? Dyslipidemia 12/28/2014  ? Gastroesophageal reflux disease without esophagitis 12/28/2014  ? Microalbuminuria 12/28/2014  ? Disorder of bone  and cartilage 12/28/2014  ? Neuralgia of left thigh 12/28/2014  ? Perennial allergic rhinitis with seasonal variation 12/28/2014  ? Tobacco abuse 12/28/2014  ? Mononeuropathy of left lower extremity 12/28/2014  ? Hypothyroidism 09/22/2005  ? Hypertension, benign 09/22/2005  ? ? ?Past Surgical History:  ?Procedure Laterality Date  ? CATARACT EXTRACTION Bilateral 1980  ? CESAREAN SECTION    ? CORNEAL TRANSPLANT Left   ? [facility]-Treatment for blindness.  ? ? ?Family History  ?Problem Relation Age of Onset  ? Diabetes Mother   ? Hypertension Mother   ? Pancreatic cancer Mother   ? Alcohol abuse Father   ? Arthritis/Rheumatoid Sister   ? Heart failure Sister   ? Diabetes Sister   ? Kidney disease Sister   ? Diabetes Brother   ? Breast cancer Neg Hx   ? ? ?Social History  ? ?Tobacco Use  ? Smoking status: Every Day  ?  Packs/day: 1.00  ?  Years: 46.00  ?  Pack years: 46.00  ?  Types: Cigarettes  ?  Start date: 01/07/1985  ?  Last attempt to quit: 02/01/2020  ?  Years since quitting: 1.6  ? Smokeless tobacco: Never  ? Tobacco comments:  ?  8 cigs - 07/07/2021  ?Substance Use Topics  ? Alcohol use: No  ?  Alcohol/week: 0.0 standard drinks  ? ? ? ?Current Outpatient Medications:  ?  ACCU-CHEK AVIVA PLUS test strip, CHECK BLOOD SUGAR ONCE DAILY, Disp: 100 each, Rfl: 2 ?  albuterol (VENTOLIN HFA) 108 (90 Base) MCG/ACT inhaler, Inhale 2 puffs into the lungs every 6 (six) hours as needed for wheezing or shortness of breath., Disp: 18 g, Rfl: 0 ?  Alcohol Swabs (ALCOHOL PREP) 70 % PADS, CHECK BLOOD SUGAR 2 TIMES A DAY, Disp: 200 each, Rfl: 3 ?  AquaLance Lancets 30G MISC, CHECK BLOOD SUGAR 2 TIMES A DAY, Disp: 200 each, Rfl: 3 ?  aspirin 81 MG chewable tablet, Chew 1 tablet by mouth daily., Disp: , Rfl:  ?  atorvastatin (LIPITOR) 40 MG tablet, TAKE 1 TABLET BY MOUTH DAILY (IN PLACE OF SIMVASTATIN), Disp: 90 tablet, Rfl: 1 ?  Baclofen 5 MG TABS, Take 1 tablet by mouth 2 (two) times daily as needed., Disp: , Rfl:  ?  Cholecalciferol  (VITAMIN D) 50 MCG (2000 UT) CAPS, Take 1 capsule (2,000 Units total) by mouth daily., Disp: 100 capsule, Rfl: 1 ?  Cyanocobalamin (B-12) 1000 MCG SUBL, Place 1 tablet under the tongue daily., Disp: 100 tablet, Rfl: 1 ?  dorzolamide-timolol (COSOPT) 22.3-6.8 MG/ML ophthalmic solution, Place 1 drop into both eyes 2 (two) times daily. , Disp: , Rfl:  ?  Fluticasone-Umeclidin-Vilant (TRELEGY ELLIPTA) 100-62.5-25 MCG/INH AEPB, Inhale 1 puff into the lungs daily., Disp: 1 each, Rfl: 5 ?  folic acid (FOLVITE) 1 MG tablet, Take 1 mg by mouth daily. , Disp: , Rfl:  ?  gabapentin (NEURONTIN) 300 MG capsule, Take 1 capsule by mouth 2 (two) times daily., Disp: , Rfl:  ?  HYDROcodone-acetaminophen (NORCO/VICODIN)  5-325 MG tablet, Take 2 tablets by mouth 2 (two) times daily as needed for moderate pain., Disp: 30 tablet, Rfl: 0 ?  JARDIANCE 25 MG TABS tablet, TAKE 1 TABLET BY MOUTH DAILY, Disp: 90 tablet, Rfl: 1 ?  Lancet Devices (SIMPLE DIAGNOSTICS LANCING DEV) MISC, Check blood sugar once daily fasting, and up to 4 times daily PRN., Disp: 100 each, Rfl: 2 ?  metFORMIN (GLUCOPHAGE-XR) 750 MG 24 hr tablet, Take 2 tablets (1,500 mg total) by mouth daily with breakfast., Disp: 180 tablet, Rfl: 1 ?  methotrexate (RHEUMATREX) 2.5 MG tablet, Take 7.5 mg by mouth every Friday., Disp: , Rfl:  ?  Ropinirole HCl 6 MG TB24, Take 1 tablet by mouth at bedtime as needed., Disp: , Rfl:  ?  triamcinolone ointment (KENALOG) 0.1 %, Apply topically 2 (two) times daily., Disp: 30 g, Rfl: 0 ? ?Allergies  ?Allergen Reactions  ? Penicillins Rash  ? ? ?I personally reviewed active problem list, medication list, allergies, family history, social history, health maintenance with the patient/caregiver today. ? ? ?ROS ? ?Constitutional: Negative for fever or weight change.  ?Respiratory: Positive  for cough and shortness of breath.   ?Cardiovascular: Negative for chest pain or palpitations.  ?Gastrointestinal: Negative for abdominal pain, no bowel changes.   ?Musculoskeletal: Negative for gait problem or joint swelling.  ?Skin: positive  for rash.  ?Neurological: Negative for dizziness or headache.  ?No other specific complaints in a complete review of systems

## 2021-09-27 ENCOUNTER — Ambulatory Visit (INDEPENDENT_AMBULATORY_CARE_PROVIDER_SITE_OTHER): Payer: Medicare Other | Admitting: Family Medicine

## 2021-09-27 ENCOUNTER — Encounter: Payer: Self-pay | Admitting: Family Medicine

## 2021-09-27 VITALS — BP 122/70 | HR 87 | Resp 16 | Ht 59.0 in | Wt 166.0 lb

## 2021-09-27 DIAGNOSIS — E1169 Type 2 diabetes mellitus with other specified complication: Secondary | ICD-10-CM

## 2021-09-27 DIAGNOSIS — E785 Hyperlipidemia, unspecified: Secondary | ICD-10-CM

## 2021-09-27 DIAGNOSIS — E538 Deficiency of other specified B group vitamins: Secondary | ICD-10-CM

## 2021-09-27 DIAGNOSIS — I152 Hypertension secondary to endocrine disorders: Secondary | ICD-10-CM

## 2021-09-27 DIAGNOSIS — I700 Atherosclerosis of aorta: Secondary | ICD-10-CM

## 2021-09-27 DIAGNOSIS — M545 Low back pain, unspecified: Secondary | ICD-10-CM

## 2021-09-27 DIAGNOSIS — E559 Vitamin D deficiency, unspecified: Secondary | ICD-10-CM

## 2021-09-27 DIAGNOSIS — R87610 Atypical squamous cells of undetermined significance on cytologic smear of cervix (ASC-US): Secondary | ICD-10-CM

## 2021-09-27 DIAGNOSIS — G8929 Other chronic pain: Secondary | ICD-10-CM

## 2021-09-27 DIAGNOSIS — J410 Simple chronic bronchitis: Secondary | ICD-10-CM

## 2021-09-27 DIAGNOSIS — Z23 Encounter for immunization: Secondary | ICD-10-CM

## 2021-09-27 DIAGNOSIS — G2581 Restless legs syndrome: Secondary | ICD-10-CM

## 2021-09-27 DIAGNOSIS — E1159 Type 2 diabetes mellitus with other circulatory complications: Secondary | ICD-10-CM

## 2021-09-27 DIAGNOSIS — R29818 Other symptoms and signs involving the nervous system: Secondary | ICD-10-CM

## 2021-09-27 MED ORDER — SHINGRIX 50 MCG/0.5ML IM SUSR: 0.5000 mL | Freq: Once | INTRAMUSCULAR | 1 refills | Status: AC

## 2021-09-27 MED ORDER — HYDROCODONE-ACETAMINOPHEN 5-325 MG PO TABS: 2.0000 | ORAL_TABLET | Freq: Two times a day (BID) | ORAL | 0 refills | Status: DC | PRN

## 2021-09-27 MED ORDER — TETANUS-DIPHTH-ACELL PERTUSSIS 5-2-15.5 LF-MCG/0.5 IM SUSP: 0.5000 mL | Freq: Once | INTRAMUSCULAR | 0 refills | Status: AC

## 2021-09-27 MED ORDER — TRELEGY ELLIPTA 100-62.5-25 MCG/ACT IN AEPB: 1.0000 | INHALATION_SPRAY | Freq: Every day | RESPIRATORY_TRACT | 5 refills | Status: DC

## 2021-09-27 NOTE — Patient Instructions (Signed)
Check with insurance if they pay for shingrix and Tdap  ?

## 2021-09-28 LAB — COMPLETE METABOLIC PANEL WITH GFR
AG Ratio: 1.3 (calc) (ref 1.0–2.5)
ALT: 19 U/L (ref 6–29)
AST: 16 U/L (ref 10–35)
Albumin: 4.1 g/dL (ref 3.6–5.1)
Alkaline phosphatase (APISO): 83 U/L (ref 37–153)
BUN: 13 mg/dL (ref 7–25)
CO2: 28 mmol/L (ref 20–32)
Calcium: 9.8 mg/dL (ref 8.6–10.4)
Chloride: 105 mmol/L (ref 98–110)
Creat: 0.62 mg/dL (ref 0.50–1.05)
Globulin: 3.2 g/dL (calc) (ref 1.9–3.7)
Glucose, Bld: 115 mg/dL — ABNORMAL HIGH (ref 65–99)
Potassium: 4.4 mmol/L (ref 3.5–5.3)
Sodium: 141 mmol/L (ref 135–146)
Total Bilirubin: 0.3 mg/dL (ref 0.2–1.2)
Total Protein: 7.3 g/dL (ref 6.1–8.1)
eGFR: 98 mL/min/{1.73_m2} (ref >=60)

## 2021-09-28 LAB — LIPID PANEL
Cholesterol: 104 mg/dL (ref <200)
HDL: 50 mg/dL (ref >=50)
LDL Cholesterol (Calc): 40 mg/dL (calc)
Non-HDL Cholesterol (Calc): 54 mg/dL (calc) (ref <130)
Total CHOL/HDL Ratio: 2.1 (calc) (ref <5.0)
Triglycerides: 60 mg/dL (ref <150)

## 2021-09-28 LAB — MICROALBUMIN / CREATININE URINE RATIO
Creatinine, Urine: 56 mg/dL (ref 20–275)
Microalb, Ur: <0.2 mg/dL

## 2021-09-28 LAB — HEMOGLOBIN A1C
Hgb A1c MFr Bld: 6.9 % of total Hgb — ABNORMAL HIGH (ref <5.7)
Mean Plasma Glucose: 151 mg/dL
eAG (mmol/L): 8.4 mmol/L

## 2021-09-28 LAB — VITAMIN B12: Vitamin B-12: 602 pg/mL (ref 200–1100)

## 2021-09-28 LAB — VITAMIN D 25 HYDROXY (VIT D DEFICIENCY, FRACTURES): Vit D, 25-Hydroxy: 18 ng/mL — ABNORMAL LOW (ref 30–100)

## 2021-10-04 ENCOUNTER — Telehealth: Payer: Self-pay

## 2021-10-04 NOTE — Progress Notes (Signed)
? ? ?  Chronic Care Management ?Pharmacy Assistant  ? ?Name: Deleta Dickstein  MRN: [id number] DOB: [birth year] ? ?Patient called to be reminded of her telephone appointment with Junius Argyle, CPP on 10/05/2021 @ 1500. ? ?Patient aware of appointment date, time, and type of appointment (either telephone or in person). Patient aware to have/bring all medications, supplements, blood pressure and/or blood sugar logs to visit. ? ?Star Rating Drug: ?Atorvastatin 40 mg last filled on 06/13/2021 for a 90-Day supply with [facility] ?Metformin 750 mg last filled on 08/29/2021 for a 30-Day supply with [facility] ?Jardiance 25 mg last filled on 06/13/2021 for a 90-Day supply with [facility] ? ?Any gaps in medications fill history? Yes ? ?Care Gaps: ?Zoster Vaccine ?Mammogram ? ? ?Lynann Bologna, CPA/CMA ?Clinical Pharmacist Assistant ?Phone: [phone]  ? ?

## 2021-10-05 ENCOUNTER — Telehealth: Payer: Medicare Other

## 2021-10-05 NOTE — Progress Notes (Incomplete)
? ?Chronic Care Management ?Pharmacy Note ? ?10/05/2021 ?Name:  Derhonda Eastlick MRN:  [id number] DOB:  1955/08/31 ? ?Summary: ?Patient presents for CCM follow-up. -Counseled on carbohydrate counseling extensively and incorporating more non-starchy vegetables  ? ?Recommendations/Changes made from today's visit: ?Continue current medications ? ?Plan: ?CPP follow-up 1 month ? ?Subjective: ?Junetta Hearn is an 66 y.o. year old female who is a primary patient of Sowles, Drue Stager, MD.  The CCM team was consulted for assistance with disease management and care coordination needs.   ? ?Engaged with patient by telephone for follow up visit in response to provider referral for pharmacy case management and/or care coordination services.  ? ?Consent to Services:  ?The patient was given information about Chronic Care Management services, agreed to services, and gave verbal consent prior to initiation of services.  Please see initial visit note for detailed documentation.  ? ?Patient Care Team: ?Steele Sizer, MD as PCP - General (Family Medicine) ?Ernesto Rutherford, MD as Referring Physician (Dermatology) ?Vladimir Crofts, MD as Consulting Physician (Neurology) ?Germaine Pomfret, [facility] as Pharmacist (Pharmacist) ? ?Recent office visits: ?06/29/21: Patient presented to Dr. Ancil Boozer for follow-up. Metformin increased to 1500 mg.  ?  ?02/04/2021 Serafina Royals, FNP (PCP Video Visit) for COVID Positive- Started Benzonatate 100 mg twice daily ? ?Recent consult visits: ?07/07/21: Patient presented to Dr. Patsey Berthold (Pulnology) for follow-up.  ? ?Hospital visits: ?None in previous 6 months ? ? ?Objective: ? ?Lab Results  ?Component Value Date  ? CREATININE 0.62 09/27/2021  ? BUN 13 09/27/2021  ? GFRNONAA 97 10/22/2020  ? GFRAA 113 10/22/2020  ? NA 141 09/27/2021  ? K 4.4 09/27/2021  ? CALCIUM 9.8 09/27/2021  ? CO2 28 09/27/2021  ? GLUCOSE 115 (H) 09/27/2021  ? ? ?Lab Results  ?Component Value Date/Time  ? HGBA1C 6.9 (H) 09/27/2021 10:46 AM  ? HGBA1C  8.4 06/20/2021 12:00 AM  ? HGBA1C 7.6 (A) 02/22/2021 10:29 AM  ? HGBA1C 6.8 (A) 10/22/2020 10:27 AM  ? HGBA1C 7.3 (A) 10/21/2018 09:51 AM  ? HGBA1C 7.2 (A) 04/19/2018 11:35 AM  ? HGBA1C 6.5 (A) 01/16/2018 10:56 AM  ? MICROALBUR <0.2 09/27/2021 10:46 AM  ? MICROALBUR 0.2 10/22/2020 11:03 AM  ? MICROALBUR negative 01/16/2018 10:57 AM  ? MICROALBUR 20 12/13/2016 10:24 AM  ?  ?Last diabetic Eye exam:  ?Lab Results  ?Component Value Date/Time  ? HMDIABEYEEXA No Retinopathy 10/18/2020 12:00 AM  ?  ?Last diabetic Foot exam:  ?Lab Results  ?Component Value Date/Time  ? HMDIABFOOTEX normal 08/04/2015 12:00 AM  ?  ? ?Lab Results  ?Component Value Date  ? CHOL 104 09/27/2021  ? HDL 50 09/27/2021  ? [place] 40 09/27/2021  ? TRIG 60 09/27/2021  ? CHOLHDL 2.1 09/27/2021  ? ? ? ?  Latest Ref Rng & Units 09/27/2021  ? 10:46 AM 10/22/2020  ? 11:03 AM 02/10/2020  ?  6:10 AM  ?Hepatic Function  ?Total Protein 6.1 - 8.1 g/dL 7.3   6.7   7.0    ?Albumin 3.5 - 5.0 g/dL   2.7    ?AST 10 - 35 U/L 16   10   27     ?ALT 6 - 29 U/L 19   9   23     ?Alk Phosphatase 38 - 126 U/L   80    ?Total Bilirubin 0.2 - 1.2 mg/dL 0.3   0.3   0.6    ? ? ?Lab Results  ?Component Value Date/Time  ? TSH 0.94 10/22/2020 11:03  AM  ? TSH 1.22 06/20/2017 12:13 PM  ? ? ? ?  Latest Ref Rng & Units 10/22/2020  ? 11:03 AM 02/10/2020  ?  6:10 AM 02/09/2020  ?  4:21 AM  ?CBC  ?WBC 3.8 - 10.8 Thousand/uL 8.1   13.8   17.0    ?Hemoglobin 11.7 - 15.5 g/dL 14.9   14.2   12.8    ?Hematocrit 35.0 - 45.0 % 43.6   41.9   37.1    ?Platelets 140 - 400 Thousand/uL 205   316   325    ? ? ?Lab Results  ?Component Value Date/Time  ? VD25OH 18 (L) 09/27/2021 10:46 AM  ? VD25OH 30 10/22/2020 11:03 AM  ? ? ?Clinical ASCVD: No  ?The ASCVD Risk score (Arnett DK, et al., 2019) failed to calculate for the following reasons: ?  The valid total cholesterol range is 130 to 320 mg/dL   ? ? ?  09/27/2021  ? 10:05 AM 07/19/2021  ?  9:31 AM 06/29/2021  ? 11:25 AM  ?Depression screen PHQ 2/9  ?Decreased  Interest 0 0 0  ?Down, Depressed, Hopeless 0 0 0  ?PHQ - 2 Score 0 0 0  ?Altered sleeping 0 0 0  ?Tired, decreased energy 0 0 0  ?Change in appetite 0 0 0  ?Feeling bad or failure about yourself  0 0 0  ?Trouble concentrating 0 0 0  ?Moving slowly or fidgety/restless 0 0 0  ?Suicidal thoughts 0 0 0  ?PHQ-9 Score 0 0 0  ?  ?Social History  ? ?Tobacco Use  ?Smoking Status Every Day  ? Packs/day: 1.00  ? Years: 46.00  ? Pack years: 46.00  ? Types: Cigarettes  ? Start date: 01/07/1985  ? Last attempt to quit: 02/01/2020  ? Years since quitting: 1.6  ?Smokeless Tobacco Never  ?Tobacco Comments  ? 6 cigs - 09/2021  ? ?BP Readings from Last 3 Encounters:  ?09/27/21 122/70  ?07/07/21 100/60  ?06/29/21 126/68  ? ?Pulse Readings from Last 3 Encounters:  ?09/27/21 87  ?07/07/21 69  ?06/29/21 86  ? ?Wt Readings from Last 3 Encounters:  ?09/27/21 166 lb (75.3 kg)  ?07/26/21 162 lb (73.5 kg)  ?07/07/21 165 lb 3.2 oz (74.9 kg)  ? ?BMI Readings from Last 3 Encounters:  ?09/27/21 33.53 kg/m?  ?07/26/21 32.72 kg/m?  ?07/07/21 33.37 kg/m?  ? ? ?Assessment/Interventions: Review of patient past medical history, allergies, medications, health status, including review of consultants reports, laboratory and other test data, was performed as part of comprehensive evaluation and provision of chronic care management services.  ? ?SDOH:  (Social Determinants of Health) assessments and interventions performed: Yes ? ? ?SDOH Screenings  ? ?Alcohol Screen: Low Risk   ? Last Alcohol Screening Score (AUDIT): 0  ?Depression (PHQ2-9): Low Risk   ? PHQ-2 Score: 0  ?Financial Resource Strain: Low Risk   ? Difficulty of Paying Living Expenses: Not hard at all  ?Food Insecurity: No Food Insecurity  ? Worried About Charity fundraiser in the Last Year: Never true  ? Ran Out of Food in the Last Year: Never true  ?Housing: Low Risk   ? Last Housing Risk Score: 0  ?Physical Activity: Insufficiently Active  ? Days of Exercise per Week: 2 days  ? Minutes of  Exercise per Session: 20 min  ?Social Connections: Moderately Integrated  ? Frequency of Communication with Friends and Family: More than three times a week  ? Frequency of Social Gatherings  with Friends and Family: Once a week  ? Attends Religious Services: More than 4 times per year  ? Active Member of Clubs or Organizations: Yes  ? Attends Archivist Meetings: More than 4 times per year  ? Marital Status: Never married  ?Stress: No Stress Concern Present  ? Feeling of Stress : Not at all  ?Tobacco Use: High Risk  ? Smoking Tobacco Use: Every Day  ? Smokeless Tobacco Use: Never  ? Passive Exposure: Not on file  ?Transportation Needs: No Transportation Needs  ? Lack of Transportation (Medical): No  ? Lack of Transportation (Non-Medical): No  ? ? ?CCM Care Plan ? ?Allergies  ?Allergen Reactions  ? Penicillins Rash  ? ? ?Medications Reviewed Today   ? ? Reviewed by Steele Sizer, MD (Physician) on 09/27/21 at 1032  Med List Status: <None>  ? ?Medication Order Taking? Sig Documenting Provider Last Dose Status Informant  ?ACCU-CHEK AVIVA PLUS test strip [id number] Yes CHECK BLOOD SUGAR ONCE DAILY Steele Sizer, MD Taking Active   ?albuterol (VENTOLIN HFA) 108 (90 Base) MCG/ACT inhaler [id number] Yes Inhale 2 puffs into the lungs every 6 (six) hours as needed for wheezing or shortness of breath. Tyler Pita, MD Taking Active   ?Alcohol Swabs (ALCOHOL PREP) 70 % PADS [id number] Yes CHECK BLOOD SUGAR 2 TIMES A Eloise Harman, MD Taking Active   ?AquaLance Lancets 30G MISC [id number] Yes CHECK BLOOD SUGAR 2 TIMES A DAY Sowles, Drue Stager, MD Taking Active   ?aspirin 81 MG chewable tablet [id number] Yes Chew 1 tablet by mouth daily. [provider] Taking Active Other  ?         ?Med Note Netty Starring Feb 05, 2020  8:40 AM)    ?atorvastatin (LIPITOR) 40 MG tablet [id number] Yes TAKE 1 TABLET BY MOUTH DAILY (IN PLACE OF SIMVASTATIN) Sowles, Drue Stager, MD Taking Active   ?baclofen (LIORESAL) 10  MG tablet [id number]  Take 1 tablet by mouth daily at 12 noon. [provider]  Active   ?Cholecalciferol 50 MCG (2000 UT) CAPS [id number] Yes Take by mouth. [provider]  Active   ?clobetaso

## 2021-10-10 ENCOUNTER — Ambulatory Visit (INDEPENDENT_AMBULATORY_CARE_PROVIDER_SITE_OTHER): Payer: Medicare Other | Admitting: Pulmonary Disease

## 2021-10-10 ENCOUNTER — Encounter: Payer: Self-pay | Admitting: Pulmonary Disease

## 2021-10-10 VITALS — BP 120/58 | HR 88 | Temp 98.0°F | Ht 59.0 in | Wt 166.2 lb

## 2021-10-10 DIAGNOSIS — F17210 Nicotine dependence, cigarettes, uncomplicated: Secondary | ICD-10-CM

## 2021-10-10 DIAGNOSIS — J449 Chronic obstructive pulmonary disease, unspecified: Secondary | ICD-10-CM

## 2021-10-10 DIAGNOSIS — R9389 Abnormal findings on diagnostic imaging of other specified body structures: Secondary | ICD-10-CM

## 2021-10-10 NOTE — Progress Notes (Unsigned)
Subjective:    Patient ID: Julie Wall, female    DOB: May 12, 1956, 66 y.o.   MRN: [id number] Patient Care Team: Alba Cory, MD as PCP - General (Family Medicine) Herschel Senegal, MD as Referring Physician (Dermatology) Lonell Face, MD as Consulting Physician (Neurology) Gaspar Cola, [facility] (Inactive) as Pharmacist (Pharmacist) Juanell Fairly, RN as Case Manager  HPI This is a 66 year old former smoker (46-pack-year history) who presents for follow up of potential COPD.  She also follows for.  The patient currently is on Trelegy Ellipta.  She was initially seen here on 24 June 2020 and then was lost to follow-up.  At that time she had pulmonary function testing ordered which she never realized.  She also was noted to have symptoms consistent with sleep apnea and did indeed perform home sleep study which showed moderate sleep apnea.  She is on auto CPAP.  Notes benefit from the therapy.  Does not note any symptoms of dyspnea.  No cough or sputum production.  She had COVID-19 in September 2021 however did well from that perspective.  She has not noticed any sequela from that infection.  Most recent lung cancer screening CT was performed on 17 May 2020 and she needs to reenroll in the program.  I have reviewed those films and have reviewed as far back as 2007.  There are no significant changes.  I reviewed the films with the patient.  She has not had any weight loss or anorexia.  No fevers, chills or sweats.  No cough or sputum production.  No hemoptysis.  Does not endorse any orthopnea or paroxysmal nocturnal dyspnea.  She does get occasional lower extremity edema.  She does have occasional gastroesophageal reflux symptoms but does not require chronic medications for this.   The patient is on methotrexate for severe atopic dermatitis.  She has not noticed any symptomatology taking this medication.  She states that Trelegy does help with her COPD symptoms.   Review of  Systems A 10 point review of systems was performed and it is as noted above otherwise negative.    Current Meds  Medication Sig   ACCU-CHEK AVIVA PLUS test strip CHECK BLOOD SUGAR ONCE DAILY   albuterol (VENTOLIN HFA) 108 (90 Base) MCG/ACT inhaler Inhale 2 puffs into the lungs every 6 (six) hours as needed for wheezing or shortness of breath.   Alcohol Swabs (ALCOHOL PREP) 70 % PADS CHECK BLOOD SUGAR 2 TIMES A DAY   AquaLance Lancets 30G MISC CHECK BLOOD SUGAR 2 TIMES A DAY   aspirin 81 MG chewable tablet Chew 1 tablet by mouth daily.   atorvastatin (LIPITOR) 40 MG tablet TAKE 1 TABLET BY MOUTH DAILY (IN PLACE OF SIMVASTATIN)   baclofen (LIORESAL) 10 MG tablet Take 1 tablet by mouth daily at 12 noon.   Cholecalciferol 50 MCG (2000 UT) CAPS Take by mouth.   clobetasol ointment (TEMOVATE) 0.05 % Apply 1 application. topically 2 (two) times daily.   Cyanocobalamin (B-12) 1000 MCG SUBL Place 1 tablet under the tongue daily.   dorzolamide-timolol (COSOPT) 22.3-6.8 MG/ML ophthalmic solution Place 1 drop into both eyes 2 (two) times daily.    Fluticasone-Umeclidin-Vilant (TRELEGY ELLIPTA) 100-62.5-25 MCG/ACT AEPB Inhale 1 puff into the lungs daily.   folic acid (FOLVITE) 1 MG tablet Take 1 mg by mouth daily.    gabapentin (NEURONTIN) 300 MG capsule Take 1 capsule by mouth 2 (two) times daily.   HYDROcodone-acetaminophen (NORCO/VICODIN) 5-325 MG tablet Take 2 tablets by mouth 2 (  two) times daily as needed for moderate pain.   JARDIANCE 25 MG TABS tablet TAKE 1 TABLET BY MOUTH DAILY   Lancet Devices (SIMPLE DIAGNOSTICS LANCING DEV) MISC Check blood sugar once daily fasting, and up to 4 times daily PRN.   metFORMIN (GLUCOPHAGE-XR) 750 MG 24 hr tablet Take 2 tablets (1,500 mg total) by mouth daily with breakfast.   methotrexate (RHEUMATREX) 2.5 MG tablet Take 7.5 mg by mouth every Friday.   Ropinirole HCl 6 MG TB24 Take 1 tablet by mouth at bedtime as needed.   triamcinolone ointment (KENALOG) 0.1 %  Apply topically 2 (two) times daily.       Objective:   Physical Exam BP (!) 120/58 (BP Location: Left Arm, Cuff Size: Large)   Pulse 88   Temp 98 F (36.7 C) (Temporal)   Ht 4\' 11"  (1.499 m)   Wt 166 lb 3.2 oz (75.4 kg)   SpO2 95%   BMI 33.57 kg/m   SpO2: 95 % O2 Device: None (Room air)  GENERAL: Overweight woman in no acute distress.  Fully ambulatory. HEAD: Normocephalic, atraumatic.  EYES: No scleral icterus.  Left corneal opacity (transplant). MOUTH: Patient intact, oral mucosa moist.  No thrush. NECK: Supple. No thyromegaly. Trachea midline. No JVD.  No adenopathy. PULMONARY: Good air entry bilaterally.  Coarse otherwise, no adventitious sounds. CARDIOVASCULAR: S1 and S2. Regular rate and rhythm.  No rubs, murmurs or gallops heard. ABDOMEN: Obese, benign. MUSCULOSKELETAL: No joint deformity, mild clubbing of fingers, no edema.  NEUROLOGIC: No focal deficit, no gait disturbance.  Speech is fluent. SKIN: Intact,warm,dry.  On limited exam no rashes PSYCH: Mood and behavior normal.     Assessment & Plan:     ICD-10-CM   1. Stage 1 mild COPD by GOLD classification (HCC)  J44.9 CANCELED: Pulse oximetry, overnight   Continue Trelegy Ellipta 100 Continue as needed albuterol    2. Abnormal screening computed tomography (CT) of chest  R93.89    Recent LDCT lung RADS 3 Follow-up in 6 months Scan already ordered    3. Moderate cigarette smoker (10-19 per day)  F17.210    Patient counseled regards discontinuation of smoking Total counseling time 3 to 5 minutes     Smoking cessation instruction/counseling given:  counseled patient on the dangers of tobacco use, advised patient to stop smoking, and reviewed strategies to maximize success.  Patient will continue Trelegy and albuterol as she is doing.  She was counseled extensively regards to discontinuation of smoking.  Will see her in follow-up in 6 months time.  Her low-dose CT will be done in approximately 4 months and we  will be on the look out for this.  She is to contact us prior to follow-up appointment should any new difficulties arise.  Gailen Shelter, MD Advanced Bronchoscopy PCCM [facility]    *This note was dictated using voice recognition software/Dragon.  Despite best efforts to proofread, errors can occur which can change the meaning. Any transcriptional errors that result from this process are unintentional and may not be fully corrected at the time of dictation.

## 2021-10-10 NOTE — Patient Instructions (Signed)
Continue using your Trelegy. ? ?See your efforts to quit smoking. ? ?We will see you in follow-up in 6 months time. ? ?We will also keep an eye on the repeat CT that you will have in approximately 4 months. ?

## 2021-10-13 DIAGNOSIS — L309 Dermatitis, unspecified: Secondary | ICD-10-CM

## 2021-10-13 HISTORY — DX: Dermatitis, unspecified: L30.9

## 2021-10-20 ENCOUNTER — Other Ambulatory Visit: Payer: Self-pay | Admitting: Family Medicine

## 2021-10-20 DIAGNOSIS — E1129 Type 2 diabetes mellitus with other diabetic kidney complication: Secondary | ICD-10-CM

## 2021-10-20 DIAGNOSIS — E1142 Type 2 diabetes mellitus with diabetic polyneuropathy: Secondary | ICD-10-CM

## 2021-10-20 DIAGNOSIS — E785 Hyperlipidemia, unspecified: Secondary | ICD-10-CM

## 2021-11-01 LAB — HM DIABETES EYE EXAM

## 2021-11-08 ENCOUNTER — Telehealth: Payer: Self-pay

## 2021-11-08 NOTE — Progress Notes (Signed)
Chronic Care Management Pharmacy Assistant   Name: Julie Wall  MRN: [id number] DOB: 25-Dec-1955  Reason for Encounter: Diabetes Disease State Call   Recent office visits:  None ID  Recent consult visits:  10/13/2021 Lyda Jester, MD (Dermatology) for Dermatitis- Started: Doxycycline 100 mg twice daily, Prednisone 20 mg 1 tablet daily for 5 days then 0.5 tablets (10 mg) daily for 5 days, Halobetasol apply to hands twice daily, Methotrexate 2.5 mg tablet take 6 tablets once weekly, Patient to follow-up in 6 weeks  10/10/2021 Sarina Ser, MD (Pulmonary) for Follow-up- No medication changes noted, Pulse Oximetry, overnight order placed, Patient to follow-up in 6 months  Hospital visits:  None in previous 6 months  Medications: Outpatient Encounter Medications as of 11/08/2021  Medication Sig   ACCU-CHEK AVIVA PLUS test strip CHECK BLOOD SUGAR ONCE DAILY   albuterol (VENTOLIN HFA) 108 (90 Base) MCG/ACT inhaler Inhale 2 puffs into the lungs every 6 (six) hours as needed for wheezing or shortness of breath.   Alcohol Swabs (ALCOHOL PREP) 70 % PADS CHECK BLOOD SUGAR 2 TIMES A DAY   AquaLance Lancets 30G MISC CHECK BLOOD SUGAR 2 TIMES A DAY   aspirin 81 MG chewable tablet Chew 1 tablet by mouth daily.   atorvastatin (LIPITOR) 40 MG tablet TAKE 1 TABLET BY MOUTH DAILY (IN PLACE OF SIMVASTATIN)   baclofen (LIORESAL) 10 MG tablet Take 1 tablet by mouth daily at 12 noon.   Cholecalciferol 50 MCG (2000 UT) CAPS Take by mouth.   clobetasol ointment (TEMOVATE) 0.05 % Apply 1 application. topically 2 (two) times daily.   Cyanocobalamin (B-12) 1000 MCG SUBL Place 1 tablet under the tongue daily.   dorzolamide-timolol (COSOPT) 22.3-6.8 MG/ML ophthalmic solution Place 1 drop into both eyes 2 (two) times daily.    Fluticasone-Umeclidin-Vilant (TRELEGY ELLIPTA) 100-62.5-25 MCG/ACT AEPB Inhale 1 puff into the lungs daily.   folic acid (FOLVITE) 1 MG tablet Take 1 mg by mouth daily.    gabapentin  (NEURONTIN) 300 MG capsule Take 1 capsule by mouth 2 (two) times daily.   HYDROcodone-acetaminophen (NORCO/VICODIN) 5-325 MG tablet Take 2 tablets by mouth 2 (two) times daily as needed for moderate pain.   JARDIANCE 25 MG TABS tablet TAKE 1 TABLET BY MOUTH DAILY   Lancet Devices (SIMPLE DIAGNOSTICS LANCING DEV) MISC Check blood sugar once daily fasting, and up to 4 times daily PRN.   metFORMIN (GLUCOPHAGE-XR) 750 MG 24 hr tablet Take 2 tablets (1,500 mg total) by mouth daily with breakfast.   methotrexate (RHEUMATREX) 2.5 MG tablet Take 7.5 mg by mouth every Friday.   Ropinirole HCl 6 MG TB24 Take 1 tablet by mouth at bedtime as needed.   triamcinolone ointment (KENALOG) 0.1 % Apply topically 2 (two) times daily.   No facility-administered encounter medications on file as of 11/08/2021.   Care Gaps: COVID-19 Vaccine Zoster Vaccines Colonoscopy Diabetic Eye Exam Mammogram  Star Rating Drugs: Atorvastatin 40 mg last filled on 10/20/2021 for a 90-Day supply with [facility] Metformin 750 mg last filled on 08/29/2021 for a 30-Day supply with [facility] Jardiance 25 mg last filled on 10/20/2021 for a 90-Day supply with [facility]  Recent Relevant Labs: Lab Results  Component Value Date/Time   HGBA1C 6.9 (H) 09/27/2021 10:46 AM   HGBA1C 8.4 06/20/2021 12:00 AM   HGBA1C 7.6 (A) 02/22/2021 10:29 AM   HGBA1C 6.8 (A) 10/22/2020 10:27 AM   HGBA1C 7.3 (A) 10/21/2018 09:51 AM   HGBA1C 7.2 (  A) 04/19/2018 11:35 AM   HGBA1C 6.5 (A) 01/16/2018 10:56 AM   MICROALBUR <0.2 09/27/2021 10:46 AM   MICROALBUR 0.2 10/22/2020 11:03 AM   MICROALBUR negative 01/16/2018 10:57 AM   MICROALBUR 20 12/13/2016 10:24 AM    Kidney Function Lab Results  Component Value Date/Time   CREATININE 0.62 09/27/2021 10:46 AM   CREATININE 0.56 10/22/2020 11:03 AM   GFRNONAA 97 10/22/2020 11:03 AM   GFRAA 113 10/22/2020 11:03 AM   Current antihyperglycemic regimen:   Jardiance 25 mg 1 tablet daily Metformin 750 mg 2 tablets daily  What recent interventions/DTPs have been made to improve glycemic control:  None ID  Have there been any recent hospitalizations or ED visits since last visit with CPP? No  Patient denies hypoglycemic symptoms, including Pale, Sweaty, Shaky, Hungry, Nervous/irritable, and Vision changes Patient denies hyperglycemic symptoms, including blurry vision, excessive thirst, fatigue, polyuria, and weakness  How often are you checking your blood sugar? once daily What are your blood sugars ranging?  Fasting: Was not able to provide numbers today.  During the week, how often does your blood glucose drop below 70? Never Are you checking your feet daily/regularly? Yes  Adherence Review: Is the patient currently on a STATIN medication? Yes Is the patient currently on ACE/ARB medication? No Does the patient have >5 day gap between last estimated fill dates? No  I spoke with the patient and she reports she is doing well. Patient denies any ill symptoms at this time. Patient was busy but stated she is taking medications, and denies needing any refills. I was able to get patient rescheduled for her missed appointment on 12/28/2021 @ 1000 with Angelena Sole, CPP.   Per patient she has no issues or concerns today.  Adelene Idler, CPA/CMA Clinical Pharmacist Assistant Phone: [phone]

## 2021-12-27 ENCOUNTER — Telehealth: Payer: Self-pay

## 2021-12-27 NOTE — Progress Notes (Signed)
    Chronic Care Management Pharmacy Assistant   Name: Julie Wall  MRN: [id number] DOB: 1955/07/12  Patient called to be reminded of her telephone appointment with Angelena Sole, CPP on 12/28/2021 @ 1000.  No message left as the patient's phone just rang. Patient does not have a voicemail.  Star Rating Drug: Atorvastatin 40 mg last filled on 10/20/2021 for a 90-Day supply with [facility] Jardiance 25 mg last filled on 10/20/2021 for a 90-Day supply with [facility] Metformin 750 mg last filled on 11/14/2021 for a 30-Day supply with [facility]  Any gaps in medications fill history? Yes  Care Gaps: COVID-19 Vaccine Zoster Vaccine Colonoscopy Mammogram   Adelene Idler, CPA/CMA Clinical Pharmacist Assistant Phone: [phone]

## 2021-12-28 ENCOUNTER — Ambulatory Visit: Payer: Medicare Other

## 2021-12-28 NOTE — Patient Instructions (Signed)
Visit Information It was great speaking with you today!  Please let me know if you have any questions about our visit.   Goals Addressed             This Visit's Progress    Monitor and Manage My Blood Sugar-Diabetes Type 2   On track    Timeframe:  [place] Goal Priority:  High Start Date: 03/02/21                            Expected End Date: 03/02/22                      Follow Up within 90 days   - check blood sugar at prescribed times - check blood sugar before and after exercise - check blood sugar if I feel it is too high or too low - take the blood sugar meter to all doctor visits    Why is this important?   Checking your blood sugar at home helps to keep it from getting very high or very low.  Writing the results in a diary or log helps the doctor know how to care for you.  Your blood sugar log should have the time, date and the results.  Also, write down the amount of insulin or other medicine that you take.  Other information, like what you ate, exercise done and how you were feeling, will also be helpful.     Notes:      Quit Smoking   On track    Pt would like to stop smoking completely.        Patient Care Plan: General Pharmacy (Adult)     Problem Identified: Hypertension, Hyperlipidemia, Diabetes, Coronary Artery Disease, and GERD   Priority: High     [place] Goal: Patient-Specific Goal   Start Date: 03/02/2021  Expected End Date: 12/29/2022  This Visit's Progress: On track  Recent Progress: On track  Priority: High  Note:   Current Barriers:  No barriers noted  Pharmacist Clinical Goal(s):  Patient will maintain control of diabetes as evidenced by A1c less than 8%  through collaboration with PharmD and provider.   Interventions: 1:1 collaboration with Alba Cory, MD regarding development and update of comprehensive plan of care as evidenced by provider attestation and co-signature Inter-disciplinary care team collaboration (see  longitudinal plan of care) Comprehensive medication review performed; medication list updated in electronic medical record  Hypertension (BP goal <130/80) -Controlled -Current treatment: None -Medications previously tried: Lisinopril, Losartan   -Current home readings: NA -Denies hypotensive/hypertensive symptoms -Counseled to monitor BP at home weekly, document, and provide log at future appointments -Recommended to continue current medication  Hyperlipidemia: (LDL goal < 70) -Controlled -Current treatment: Atorvastatin 40 mg daily  -Medications previously tried: NA -Recommended to continue current medication  Diabetes (A1c goal <8%) -Controlled -Current medications: Jardiance 25 mg daily  Metformin XR 750 mg 2 tablets daily  -Medications previously tried: NA  -Current home glucose readings fasting glucose: 103,  -Denies hypoglycemic/hyperglycemic symptoms -Current meal patterns: Trying to cut back on sweets. Drinks diet soda estimates 3 bottles daily  -Breakfast: waffle (no syrup) +coffee   -Lunch : [place] steak and cheese sandwich + diet soda    -Supper: Skipped last, typically has Chicken + cabbage   -Drinks: Water.  -Current exercise: Walking most days - 30 minutes  -Counseled on carbohydrate counseling extensively and incorporating more non-starchy vegetables  -Recommended to  continue current medication  COPD (Goal: control symptoms and prevent exacerbations) -Controlled -Current treatment  Ventolin HFA 2 puffs every 6 hours as needed  Trelegy 1 puff daily  -Medications previously tried: NA  -Exacerbations requiring treatment in last 6 months: None -Patient reports consistent use of maintenance inhaler -Frequency of rescue inhaler use: 1 x weekly -Recommended to continue current medication  Tobacco use (Goal Quit smoking) -On track -Previous quit attempts: NA -Current treatment  4 cigarettes  -Patient unable to set quit date within the next 30 days and is  working on reducing her cigarette intake on her own.  -Provided contact information for [place] Quit Line (1-800-QUIT-NOW) and encouraged patient to reach out to this group for support.  Patient Goals/Self-Care Activities Patient will:  - check glucose daily before breakfast, document, and provide at future appointments check blood pressure weekly, document, and provide at future appointments  Follow Up Plan: Face to Face appointment with care management team member scheduled for: 04/19/2022 at 11:00 AM    Patient agreed to services and verbal consent obtained.   The patient verbalized understanding of instructions, educational materials, and care plan provided today and DECLINED offer to receive copy of patient instructions, educational materials, and care plan.   Cheyenne Adas, CPP Clinical Pharmacist Practitioner  [facility] [phone]

## 2021-12-28 NOTE — Progress Notes (Signed)
Chronic Care Management Pharmacy Note  12/28/2021 Name:  Julie Wall MRN:  [id number] DOB:  29-Oct-1955  Summary: Patient presents for CCM follow-up.   Recommendations/Changes made from today's visit: Continue current medications  Plan: CPP follow-up 1 month  Subjective: Julie Wall is an 66 y.o. year old female who is a primary patient of Sowles, Drue Stager, MD.  The CCM team was consulted for assistance with disease management and care coordination needs.    Engaged with patient by telephone for follow up visit in response to provider referral for pharmacy case management and/or care coordination services.   Consent to Services:  The patient was given information about Chronic Care Management services, agreed to services, and gave verbal consent prior to initiation of services.  Please see initial visit note for detailed documentation.   Patient Care Team: Steele Sizer, MD as PCP - General (Family Medicine) Ernesto Rutherford, MD as Referring Physician (Dermatology) Vladimir Crofts, MD as Consulting Physician (Neurology) Germaine Pomfret, [facility] as Pharmacist (Pharmacist)  Recent office visits: 09/27/21: Patient presented to Dr. Ancil Boozer for follow-up. A1c 6.9%.Trelegy   02/04/2021 Serafina Royals, FNP (PCP Video Visit) for COVID Positive- Started Benzonatate 100 mg twice daily  Recent consult visits: 10/10/21: Patient presented to Dr. Patsey Berthold (Pulnology) for follow-up.   Hospital visits: None in previous 6 months   Objective:  Lab Results  Component Value Date   CREATININE 0.62 09/27/2021   BUN 13 09/27/2021   GFRNONAA 97 10/22/2020   GFRAA 113 10/22/2020   NA 141 09/27/2021   K 4.4 09/27/2021   CALCIUM 9.8 09/27/2021   CO2 28 09/27/2021   GLUCOSE 115 (H) 09/27/2021    Lab Results  Component Value Date/Time   HGBA1C 6.9 (H) 09/27/2021 10:46 AM   HGBA1C 8.4 06/20/2021 12:00 AM   HGBA1C 7.6 (A) 02/22/2021 10:29 AM   HGBA1C 6.8 (A) 10/22/2020 10:27 AM   HGBA1C  7.3 (A) 10/21/2018 09:51 AM   HGBA1C 7.2 (A) 04/19/2018 11:35 AM   HGBA1C 6.5 (A) 01/16/2018 10:56 AM   MICROALBUR <0.2 09/27/2021 10:46 AM   MICROALBUR 0.2 10/22/2020 11:03 AM   MICROALBUR negative 01/16/2018 10:57 AM   MICROALBUR 20 12/13/2016 10:24 AM    Last diabetic Eye exam:  Lab Results  Component Value Date/Time   HMDIABEYEEXA No Retinopathy 11/01/2021 12:00 AM    Last diabetic Foot exam:  Lab Results  Component Value Date/Time   HMDIABFOOTEX normal 08/04/2015 12:00 AM     Lab Results  Component Value Date   CHOL 104 09/27/2021   HDL 50 09/27/2021   LDLCALC 40 09/27/2021   TRIG 60 09/27/2021   CHOLHDL 2.1 09/27/2021       Latest Ref Rng & Units 09/27/2021   10:46 AM 10/22/2020   11:03 AM 02/10/2020    6:10 AM  Hepatic Function  Total Protein 6.1 - 8.1 g/dL 7.3  6.7  7.0   Albumin 3.5 - 5.0 g/dL   2.7   AST 10 - 35 U/L _0 ALT 6 - 29 U/L _1 Alk Phosphatase 38 - 126 U/L   80   Total Bilirubin 0.2 - 1.2 mg/dL 0.3  0.3  0.6     Lab Results  Component Value Date/Time   TSH 0.94 10/22/2020 11:03 AM   TSH 1.22 06/20/2017 12:13 PM       Latest Ref Rng & Units 10/22/2020   11:03 AM 02/10/2020  6:10 AM 02/09/2020    4:21 AM  CBC  WBC 3.8 - 10.8 Thousand/uL 8.1  13.8  17.0   Hemoglobin 11.7 - 15.5 g/dL 14.9  14.2  12.8   Hematocrit 35.0 - 45.0 % 43.6  41.9  37.1   Platelets 140 - 400 Thousand/uL 205  316  325     Lab Results  Component Value Date/Time   VD25OH 18 (L) 09/27/2021 10:46 AM   VD25OH 30 10/22/2020 11:03 AM    Clinical ASCVD: No  The ASCVD Risk score (Arnett DK, et al., 2019) failed to calculate for the following reasons:   The valid total cholesterol range is 130 to 320 mg/dL       09/27/2021   10:05 AM 07/19/2021    9:31 AM 06/29/2021   11:25 AM  Depression screen PHQ 2/9  Decreased Interest 0 0 0  Down, Depressed, Hopeless 0 0 0  PHQ - 2 Score 0 0 0  Altered sleeping 0 0 0  Tired, decreased energy 0 0 0  Change in  appetite 0 0 0  Feeling bad or failure about yourself  0 0 0  Trouble concentrating 0 0 0  Moving slowly or fidgety/restless 0 0 0  Suicidal thoughts 0 0 0  PHQ-9 Score 0 0 0    Social History   Tobacco Use  Smoking Status Some Days   Packs/day: 1.00   Years: 46.00   Total pack years: 46.00   Types: Cigarettes   Start date: 01/07/1985   Last attempt to quit: 02/01/2020   Years since quitting: 1.9  Smokeless Tobacco Never  Tobacco Comments   1 pack per week-10/10/2021   BP Readings from Last 3 Encounters:  10/10/21 (!) 120/58  09/27/21 122/70  07/07/21 100/60   Pulse Readings from Last 3 Encounters:  10/10/21 88  09/27/21 87  07/07/21 69   Wt Readings from Last 3 Encounters:  10/10/21 166 lb 3.2 oz (75.4 kg)  09/27/21 166 lb (75.3 kg)  07/26/21 162 lb (73.5 kg)   BMI Readings from Last 3 Encounters:  10/10/21 33.57 kg/m  09/27/21 33.53 kg/m  07/26/21 32.72 kg/m    Assessment/Interventions: Review of patient past medical history, allergies, medications, health status, including review of consultants reports, laboratory and other test data, was performed as part of comprehensive evaluation and provision of chronic care management services.   SDOH:  (Social Determinants of Health) assessments and interventions performed: Yes   SDOH Screenings   Alcohol Screen: Low Risk  (07/19/2021)   Alcohol Screen    Last Alcohol Screening Score (AUDIT): 0  Depression (PHQ2-9): Low Risk  (09/27/2021)   Depression (PHQ2-9)    PHQ-2 Score: 0  Financial Resource Strain: Low Risk  (07/19/2021)   Overall Financial Resource Strain (CARDIA)    Difficulty of Paying Living Expenses: Not hard at all  Food Insecurity: No Food Insecurity (07/19/2021)   Hunger Vital Sign    Worried About Running Out of Food in the Last Year: Never true    Ran Out of Food in the Last Year: Never true  Housing: Low Risk  (07/19/2021)   Housing    Last Housing Risk Score: 0  Physical Activity: Insufficiently  Active (07/19/2021)   Exercise Vital Sign    Days of Exercise per Week: 2 days    Minutes of Exercise per Session: 20 min  Social Connections: Moderately Integrated (07/19/2021)   Social Connection and Isolation Panel [NHANES]    Frequency of Communication with Friends  and Family: More than three times a week    Frequency of Social Gatherings with Friends and Family: Once a week    Attends Religious Services: More than 4 times per year    Active Member of Clubs or Organizations: Yes    Attends Archivist Meetings: More than 4 times per year    Marital Status: Never married  Stress: No Stress Concern Present (07/19/2021)   [place]    Feeling of Stress : Not at all  Tobacco Use: High Risk (10/10/2021)   Patient History    Smoking Tobacco Use: Some Days    Smokeless Tobacco Use: Never    Passive Exposure: Not on file  Transportation Needs: No Transportation Needs (07/19/2021)   PRAPARE - Transportation    Lack of Transportation (Medical): No    Lack of Transportation (Non-Medical): No    CCM Care Plan  Allergies  Allergen Reactions   Penicillins Rash    Medications Reviewed Today     Reviewed by Linwood Dibbles, CMA (Certified Medical Assistant) on 10/10/21 at 1058  Med List Status: <None>   Medication Order Taking? Sig Documenting Provider Last Dose Status Informant  ACCU-CHEK AVIVA PLUS test strip [id number] Yes CHECK BLOOD SUGAR ONCE DAILY Ancil Boozer, Drue Stager, MD Taking Active   albuterol (VENTOLIN HFA) 108 (90 Base) MCG/ACT inhaler [id number] Yes Inhale 2 puffs into the lungs every 6 (six) hours as needed for wheezing or shortness of breath. Tyler Pita, MD Taking Active   Alcohol Swabs (ALCOHOL PREP) 70 % PADS [id number] Yes CHECK BLOOD SUGAR 2 TIMES A Eloise Harman, MD Taking Active   AquaLance Lancets 30G [place] [id number] Yes CHECK BLOOD SUGAR 2 TIMES A Bernette Mayers, Drue Stager, MD Taking Active    aspirin 81 MG chewable tablet [id number] Yes Chew 1 tablet by mouth daily. [provider] Taking Active Other           Med Note Francene Finders   Thu Feb 05, 2020  8:40 AM)    atorvastatin (LIPITOR) 40 MG tablet [id number] Yes TAKE 1 TABLET BY MOUTH DAILY (IN PLACE OF SIMVASTATIN) Steele Sizer, MD Taking Active   baclofen (LIORESAL) 10 MG tablet [id number] Yes Take 1 tablet by mouth daily at 12 noon. [provider] Taking Active   Cholecalciferol 50 MCG (2000 UT) CAPS [id number] Yes Take by mouth. [provider] Taking Active   clobetasol ointment (TEMOVATE) 0.05 % [id number] Yes Apply 1 application. topically 2 (two) times daily. [provider] Taking Active   Cyanocobalamin (B-12) 1000 MCG SUBL [id number] Yes Place 1 tablet under the tongue daily. Steele Sizer, MD Taking Active   dorzolamide-timolol (COSOPT) 22.3-6.8 MG/ML ophthalmic solution [id number] Yes Place 1 drop into both eyes 2 (two) times daily.  [provider] Taking Active Self  Fluticasone-Umeclidin-Vilant (TRELEGY ELLIPTA) 100-62.5-25 MCG/ACT AEPB [id number] Yes Inhale 1 puff into the lungs daily. Steele Sizer, MD Taking Active   folic acid (FOLVITE) 1 MG tablet [id number] Yes Take 1 mg by mouth daily.  [provider] Taking Active Other           Med Note Netty Starring Feb 05, 2020 10:23 AM)    gabapentin (NEURONTIN) 300 MG capsule [id number] Yes Take 1 capsule by mouth 2 (two) times daily. Vladimir Crofts, MD Taking Active   HYDROcodone-acetaminophen (NORCO/VICODIN) 5-325 MG tablet [id number] Yes Take 2 tablets by mouth 2 (two) times daily as needed for moderate  pain. Steele Sizer, MD Taking Active   JARDIANCE 25 MG TABS tablet [id number] Yes TAKE 1 TABLET BY MOUTH DAILY Steele Sizer, MD Taking Active   Lancet Devices (SIMPLE DIAGNOSTICS LANCING DEV) MISC [id number] Yes Check blood sugar once daily fasting, and up to 4 times daily PRN. Hubbard Hartshorn, FNP Taking  Active Other  metFORMIN (GLUCOPHAGE-XR) 750 MG 24 hr tablet [id number] Yes Take 2 tablets (1,500 mg total) by mouth daily with breakfast. Steele Sizer, MD Taking Active   methotrexate (RHEUMATREX) 2.5 MG tablet [id number] Yes Take 7.5 mg by mouth every Friday.  Taking Active Self           Med Note Netty Starring Feb 05, 2020  8:40 AM)    Ropinirole HCl 6 MG TB24 [id number] Yes Take 1 tablet by mouth at bedtime as needed. Vladimir Crofts, MD Taking Active   triamcinolone ointment (KENALOG) 0.1 % [id number] Yes Apply topically 2 (two) times daily. Lorella Nimrod, MD Taking Active             Patient Active Problem List   Diagnosis Date Noted   B12 deficiency 09/27/2021   Hypertension associated with diabetes ([place]) 09/27/2021   Diabetic polyneuropathy associated with type 2 diabetes mellitus ([place])    Hilar lymphadenopathy 02/05/2020   History of COVID-19 02/05/2020   Atherosclerosis of aorta ([place]) 05/04/2017   Pap smear abnormality of cervix with ASCUS favoring benign 04/23/2017   Vitamin D deficiency 12/14/2016   Obesity (BMI 30.0-34.9) 12/13/2015   Chronic bronchitis ([place]) 05/14/2015   Diabetes mellitus ([place]) 05/14/2015   Positive H. pylori test 04/05/2015   Rapid urease test for Helicobacter pylori infection positive 77/41/2878   Umbilical hernia 67/67/2094   Exomphalos 04/01/2015   RLS (restless legs syndrome) 01/08/2015   Type 2 diabetes mellitus with hyperlipidemia ([place]) 01/08/2015   Neurogenic claudication 01/08/2015   AD (atopic dermatitis) 12/28/2014   Chronic LBP 12/28/2014   Dyslipidemia 12/28/2014   Gastroesophageal reflux disease without esophagitis 12/28/2014   Microalbuminuria 12/28/2014   Disorder of bone and cartilage 12/28/2014   Neuralgia of left thigh 12/28/2014   Perennial allergic rhinitis with seasonal variation 12/28/2014   Tobacco abuse 12/28/2014   Mononeuropathy of left lower extremity 12/28/2014   Hypertension, benign 09/22/2005     Immunization History  Administered Date(s) Administered   Fluad Quad(high Dose 65+) 03/02/2020   Influenza Split 04/09/2012   Influenza, High Dose Seasonal PF 02/22/2021   Influenza, Seasonal, Injecte, Preservative Fre 02/17/2011   Influenza,inj,Quad PF,6+ Mos 07/16/2013, 03/23/2014, 04/01/2015, 03/14/2016, 03/16/2017, 02/19/2018, 05/07/2019   Influenza-Unspecified 07/16/2013, 03/23/2014, 04/01/2015, 03/14/2016, 03/16/2017, 02/19/2018, 05/07/2019   Pneumococcal Conjugate-13 04/01/2015   Pneumococcal Polysaccharide-23 01/26/2010, 09/05/2019   Tdap 01/25/2012   Zoster, Live 01/08/2015    Conditions to be addressed/monitored:  Hypertension, Hyperlipidemia, Diabetes, Coronary Artery Disease, and GERD  Care Plan : General Pharmacy (Adult)  Updates made by Germaine Pomfret, RPH since 12/28/2021 12:00 AM     Problem: Hypertension, Hyperlipidemia, Diabetes, Coronary Artery Disease, and GERD   Priority: High     [place] Goal: Patient-Specific Goal   Start Date: 03/02/2021  Expected End Date: 12/29/2022  This Visit's Progress: On track  Recent Progress: On track  Priority: High  Note:   Current Barriers:  No barriers noted  Pharmacist Clinical Goal(s):  Patient will maintain control of diabetes as evidenced by A1c less than 8%  through collaboration with PharmD and provider.   Interventions: 1:1 collaboration with Steele Sizer, MD regarding development  and update of comprehensive plan of care as evidenced by provider attestation and co-signature Inter-disciplinary care team collaboration (see longitudinal plan of care) Comprehensive medication review performed; medication list updated in electronic medical record  Hypertension (BP goal <130/80) -Controlled -Current treatment: None -Medications previously tried: Lisinopril, Losartan   -Current home readings: NA -Denies hypotensive/hypertensive symptoms -Counseled to monitor BP at home weekly, document, and provide  log at future appointments -Recommended to continue current medication  Hyperlipidemia: (LDL goal < 70) -Controlled -Current treatment: Atorvastatin 40 mg daily  -Medications previously tried: NA -Recommended to continue current medication  Diabetes (A1c goal <8%) -Controlled -Current medications: Jardiance 25 mg daily  Metformin XR 750 mg 2 tablets daily  -Medications previously tried: NA  -Current home glucose readings fasting glucose: 103,  -Denies hypoglycemic/hyperglycemic symptoms -Current meal patterns: Trying to cut back on sweets. Drinks diet soda estimates 3 bottles daily  -Breakfast: waffle (no syrup) +coffee   -Lunch : [place] steak and cheese sandwich + diet soda    -Supper: Skipped last, typically has Chicken + cabbage   -Drinks: Water.  -Current exercise: Walking most days - 30 minutes  -Counseled on carbohydrate counseling extensively and incorporating more non-starchy vegetables  -Recommended to continue current medication  COPD (Goal: control symptoms and prevent exacerbations) -Controlled -Current treatment  Ventolin HFA 2 puffs every 6 hours as needed  Trelegy 1 puff daily  -Medications previously tried: NA  -Exacerbations requiring treatment in last 6 months: None -Patient reports consistent use of maintenance inhaler -Frequency of rescue inhaler use: 1 x weekly -Recommended to continue current medication  Tobacco use (Goal Quit smoking) -On track -Previous quit attempts: NA -Current treatment  4 cigarettes  -Patient unable to set quit date within the next 30 days and is working on reducing her cigarette intake on her own.  -Provided contact information for [place] Quit Line (1-800-QUIT-NOW) and encouraged patient to reach out to this group for support.  Patient Goals/Self-Care Activities Patient will:  - check glucose daily before breakfast, document, and provide at future appointments check blood pressure weekly, document, and provide at future  appointments  Follow Up Plan: Face to Face appointment with care management team member scheduled for: 04/19/2022 at 11:00 AM     Medication Assistance: None required.  Patient affirms current coverage meets needs.  Compliance/Adherence/Medication fill history: Care Gaps: None ID  Star-Rating Drugs: Jardiance 25 mg last filled on 06/13/21 for a 90-Day supply with [facility] Metformin 750 mg last filled on 08/29/21 for a 90-Day supply with [facility] Atorvastatin 40 mg last filled on 06/13/21 for a 90-Day supply with [facility]  Patient's preferred pharmacy is:  [place], [place] [address] Phone: [phone] Fax: [phone]  [place], [place] - [address] [place], [place] [address] [place] [address] Phone: [phone] Fax: [phone]  Uses pill box? Yes Pt endorses 100% compliance  We discussed: Current pharmacy is preferred with insurance plan and patient is satisfied with pharmacy services Patient decided to: Continue current medication management strategy  Care Plan and Follow Up Patient Decision:  Patient agrees to Care Plan and Follow-up.  Plan: Face to Face appointment with care management team member scheduled for: 04/19/2022 at 11:00 AM  Malva Limes, [facility] [phone]

## 2022-01-27 ENCOUNTER — Ambulatory Visit: Payer: Medicare Other | Admitting: Family Medicine

## 2022-02-20 ENCOUNTER — Encounter: Payer: Medicare Other | Admitting: Family Medicine

## 2022-03-01 ENCOUNTER — Encounter: Payer: Self-pay | Admitting: Acute Care

## 2022-03-02 ENCOUNTER — Encounter: Payer: Self-pay | Admitting: Neurosurgery

## 2022-03-02 ENCOUNTER — Ambulatory Visit (INDEPENDENT_AMBULATORY_CARE_PROVIDER_SITE_OTHER): Payer: 59 | Admitting: Neurosurgery

## 2022-03-02 VITALS — BP 130/72 | Ht 59.0 in | Wt 165.0 lb

## 2022-03-02 DIAGNOSIS — M4714 Other spondylosis with myelopathy, thoracic region: Secondary | ICD-10-CM | POA: Diagnosis not present

## 2022-03-02 NOTE — Progress Notes (Signed)
Referring Physician:  Venetia Night, MD [address] [place],  [place] [address]  Primary Physician:  Alba Cory, MD  History of Present Illness: 03/02/2022 Julie Wall is here today with stable findings.  She has no new concerning findings.  She does have some numbness in her right leg when she walks.    02/24/2021  [name] continues to have stable symptoms for her balance. She just tarted physical therapy for her leg. She is currently doing well.  08/05/2020 [name] returns to see me. She feels that her symptoms are overall relatively stable. She continues to have some numbness and discomfort in her right leg prickly when she walks. She has no falls. She has no worsening numbness.  She is now smoking 2 cigarettes/day.  01/29/2020 [name] returns to see me. Her symptoms are relatively stable. She denies any falls. She continues to have some pain down her right leg in an L5 distribution. Overall, she is not substantially worse than she was 2 months ago. Her last injection did help her.  11/27/19 She has had 2 injections since I last saw her. She had a right-sided S1 injection on November 11, 2019 and Oct 16, 2019.  She has had some mild issues with walking, with her right leg dragging at times. Overall, she feels relatively stable. Her back is feeling much better.  09/25/2019 [name] is here today with a chief complaint of right posterior leg pain, numbness in bottom of right foot. She feels that her right leg is weak when walking and it gives out on her causing her to fall at times. She does report issues with balance.  She has been having problems for approximately 1 year. She reports sharp and aching pain. Her pain is as bad as 8 of the 10. She gets discomfort in the back of her legs and the bottom of her right foot. Walking aggravates her. She is able to walk 5 to 10 minutes before she has to stop due to pain. Nothing really helps.  She denies  overt weakness or bowel or bladder dysfunction. She has not had any recent falls.   Review of Systems:  A 10 point review of systems is negative, except for the pertinent positives and negatives detailed in the HPI.  Past Medical History: Past Medical History:  Diagnosis Date   Allergy    ASCUS favor benign 09/2013   negative HPV   Atopic dermatitis    Dermatologist at [facility]   Chronic low back pain    COPD (chronic obstructive pulmonary disease) (HCC)    Decreased exercise tolerance    Dental caries    Diabetes mellitus without complication (HCC)    GERD (gastroesophageal reflux disease)    Hyperlipidemia    Hypertension    Lumbosacral neuritis    Microalbuminuria    Osteopenia    Ovarian failure    Tobacco use     Past Surgical History: Past Surgical History:  Procedure Laterality Date   CATARACT EXTRACTION Bilateral 1980   CESAREAN SECTION     CORNEAL TRANSPLANT Left    [facility] for blindness.    Allergies: Allergies as of 03/02/2022 - Review Complete 03/02/2022  Allergen Reaction Noted   Penicillins Rash 12/23/2014    Medications: Current Meds  Medication Sig   ACCU-CHEK AVIVA PLUS test strip CHECK BLOOD SUGAR ONCE DAILY   albuterol (VENTOLIN HFA) 108 (90 Base) MCG/ACT inhaler Inhale 2 puffs into the lungs every 6 (  six) hours as needed for wheezing or shortness of breath.   Alcohol Swabs (ALCOHOL PREP) 70 % PADS CHECK BLOOD SUGAR 2 TIMES A DAY   AquaLance Lancets 30G MISC CHECK BLOOD SUGAR 2 TIMES A DAY   aspirin 81 MG chewable tablet Chew 1 tablet by mouth daily.   atorvastatin (LIPITOR) 40 MG tablet TAKE 1 TABLET BY MOUTH DAILY (IN PLACE OF SIMVASTATIN)   baclofen (LIORESAL) 10 MG tablet Take 1 tablet by mouth daily at 12 noon.   Cholecalciferol 50 MCG (2000 UT) CAPS Take by mouth.   clobetasol ointment (TEMOVATE) 6.27 % Apply 1 application. topically 2 (two) times daily.   Cyanocobalamin (B-12) 1000 MCG SUBL Place 1 tablet under the tongue daily.    dorzolamide-timolol (COSOPT) 22.3-6.8 MG/ML ophthalmic solution Place 1 drop into both eyes 2 (two) times daily.    Fluticasone-Umeclidin-Vilant (TRELEGY ELLIPTA) 100-62.5-25 MCG/ACT AEPB Inhale 1 puff into the lungs daily.   folic acid (FOLVITE) 1 MG tablet Take 1 mg by mouth daily.    gabapentin (NEURONTIN) 300 MG capsule Take 1 capsule by mouth 2 (two) times daily.   HYDROcodone-acetaminophen (NORCO/VICODIN) 5-325 MG tablet Take 2 tablets by mouth 2 (two) times daily as needed for moderate pain.   JARDIANCE 25 MG TABS tablet TAKE 1 TABLET BY MOUTH DAILY   Lancet Devices (SIMPLE DIAGNOSTICS LANCING DEV) MISC Check blood sugar once daily fasting, and up to 4 times daily PRN.   metFORMIN (GLUCOPHAGE-XR) 750 MG 24 hr tablet Take 2 tablets (1,500 mg total) by mouth daily with breakfast.   methotrexate (RHEUMATREX) 2.5 MG tablet Take 7.5 mg by mouth every Friday.   Ropinirole HCl 6 MG TB24 Take 1 tablet by mouth at bedtime as needed.   triamcinolone ointment (KENALOG) 0.1 % Apply topically 2 (two) times daily.    Social History: Social History   Tobacco Use   Smoking status: Some Days    Packs/day: 1.00    Years: 46.00    Total pack years: 46.00    Types: Cigarettes    Start date: 01/07/1985    Last attempt to quit: 02/01/2020    Years since quitting: 2.0   Smokeless tobacco: Never   Tobacco comments:    1 pack per week-10/10/2021  Vaping Use   Vaping Use: Never used  Substance Use Topics   Alcohol use: No    Alcohol/week: 0.0 standard drinks of alcohol   Drug use: No    Family Medical History: Family History  Problem Relation Age of Onset   Diabetes Mother    Hypertension Mother    Pancreatic cancer Mother    Alcohol abuse Father    Arthritis/Rheumatoid Sister    Heart failure Sister    Diabetes Sister    Kidney disease Sister    Diabetes Brother    Breast cancer Neg Hx     Physical Examination: Vitals:   03/02/22 0929  BP: 130/72    General: Patient is well  developed, well nourished, calm, collected, and in no apparent distress. Attention to examination is appropriate.  Neck:   Supple.  Full range of motion.  Respiratory: Patient is breathing without any difficulty.   NEUROLOGICAL:     Awake, alert, oriented to person, place, and time.  Speech is clear and fluent. Fund of knowledge is appropriate.   Cranial Nerves: Pupils equal round and reactive to light.  Facial tone is symmetric.  Facial sensation is symmetric. Shoulder shrug is symmetric. Tongue protrusion is midline.  There is  no pronator drift.  ROM of spine: full.    Strength: Side Biceps Triceps Deltoid Interossei Grip Wrist Ext. Wrist Flex.  R 5 5 5 5 5 5 5   L 5 5 5 5 5 5 5    Side Iliopsoas Quads Hamstring PF DF EHL  R 5 5 5 5 5 5   L 5 5 5 5 5 5    Reflexes are 2+ and symmetric at the biceps, triceps, brachioradialis, patella and achilles.   Hoffman's is absent.   Bilateral upper and lower extremity sensation is intact to light touch.    No evidence of dysmetria noted.  Gait is normal.     Medical Decision Making  Imaging: MRI L spine 08/03/19 IMPRESSION:  1. Moderate spinal stenosis L3-4 unchanged. Moderate subarticular  stenosis bilaterally.  2. Severe spinal stenosis with progression since the prior study.  Severe subarticular and foraminal stenosis on the left.  3. Moderate subarticular stenosis bilaterally L5-S1.  4. Progressive degenerative changes at T11-12 with significant  spinal stenosis and mild cord signal abnormality. Thoracic MRI with  dedicated axial images through this level may be helpful.   Electronically Signed    By: M.D.    On: 08/03/2019 06:38  MRI T spine 09/04/2019 IMPRESSION:  Diffuse degenerative disc narrowing and bulging with small  protrusions. Dominant findings at T11-12 where there is cord  impingement with flattening and mild T2 hyperintensity by prior  lumbar MRI.   Electronically Signed    By: 08/05/19  M.D.    On: 09/04/2019 08:31  I have personally reviewed the images and agree with the above interpretation.  Assessment and Plan: Ms. Asbill is a pleasant 66 y.o. female with potentially mild thoracic myelopathy.  She has symptoms that may be related to her right lumbar radiculopathy.  She is currently not interested in any intervention.  We discussed warning signs for thoracic myelopathy.  If her condition worsens, we will reevaluate with imaging.  Additionally, she may have lumbar radiculopathy or neurogenic claudication in the future, which I would reevaluate if it begins to cause trouble with her ability to go about her day-to-day life.  She did mention consideration of an epidural steroid injection with Dr. Marnee Spring.  She will think about that and let me know if she would like to be referred over there.  If she would, we will send a referral for consultation with Dr. 11/04/2019.  I will see her back on an as-needed basis if she develops worsening symptoms.   I spent a total of 20 minutes in face-to-face and non-face-to-face activities related to this patient's care today.  Thank you for involving me in the care of this patient.      Zenda Herskowitz K. Jacqulyn Bath MD, [facility] Neurosurgery

## 2022-04-05 ENCOUNTER — Ambulatory Visit: Payer: Medicare Other | Admitting: Pulmonary Disease

## 2022-04-17 ENCOUNTER — Other Ambulatory Visit: Payer: Self-pay | Admitting: Family Medicine

## 2022-04-17 DIAGNOSIS — E785 Hyperlipidemia, unspecified: Secondary | ICD-10-CM

## 2022-04-17 DIAGNOSIS — E1129 Type 2 diabetes mellitus with other diabetic kidney complication: Secondary | ICD-10-CM

## 2022-04-17 DIAGNOSIS — E1142 Type 2 diabetes mellitus with diabetic polyneuropathy: Secondary | ICD-10-CM

## 2022-04-19 ENCOUNTER — Ambulatory Visit: Payer: Medicare Other

## 2022-05-24 NOTE — Patient Instructions (Incomplete)
Preventive Care 65 Years and Older, Female Preventive care refers to lifestyle choices and visits with your health care provider that can promote health and wellness. Preventive care visits are also called wellness exams. What can I expect for my preventive care visit? Counseling Your health care provider may ask you questions about your: Medical history, including: Past medical problems. Family medical history. Pregnancy and menstrual history. History of falls. Current health, including: Memory and ability to understand (cognition). Emotional well-being. Home life and relationship well-being. Sexual activity and sexual health. Lifestyle, including: Alcohol, nicotine or tobacco, and drug use. Access to firearms. Diet, exercise, and sleep habits. Work and work environment. Sunscreen use. Safety issues such as seatbelt and bike helmet use. Physical exam Your health care provider will check your: Height and weight. These may be used to calculate your BMI (body mass index). BMI is a measurement that tells if you are at a healthy weight. Waist circumference. This measures the distance around your waistline. This measurement also tells if you are at a healthy weight and may help predict your risk of certain diseases, such as type 2 diabetes and high blood pressure. Heart rate and blood pressure. Body temperature. Skin for abnormal spots. What immunizations do I need?  Vaccines are usually given at various ages, according to a schedule. Your health care provider will recommend vaccines for you based on your age, medical history, and lifestyle or other factors, such as travel or where you work. What tests do I need? Screening Your health care provider may recommend screening tests for certain conditions. This may include: Lipid and cholesterol levels. Hepatitis C test. Hepatitis B test. HIV (human immunodeficiency virus) test. STI (sexually transmitted infection) testing, if you are at  risk. Lung cancer screening. Colorectal cancer screening. Diabetes screening. This is done by checking your blood sugar (glucose) after you have not eaten for a while (fasting). Mammogram. Talk with your health care provider about how often you should have regular mammograms. BRCA-related cancer screening. This may be done if you have a family history of breast, ovarian, tubal, or peritoneal cancers. Bone density scan. This is done to screen for osteoporosis. Talk with your health care provider about your test results, treatment options, and if necessary, the need for more tests. Follow these instructions at home: Eating and drinking  Eat a diet that includes fresh fruits and vegetables, whole grains, lean protein, and low-fat dairy products. Limit your intake of foods with high amounts of sugar, saturated fats, and salt. Take vitamin and mineral supplements as recommended by your health care provider. Do not drink alcohol if your health care provider tells you not to drink. If you drink alcohol: Limit how much you have to 0-1 drink a day. Know how much alcohol is in your drink. In the [place] one drink equals one 12 oz bottle of beer (355 mL), one 5 oz glass of wine (148 mL), or one 1 oz glass of hard liquor (44 mL). Lifestyle Brush your teeth every morning and night with fluoride toothpaste. Floss one time each day. Exercise for at least 30 minutes 5 or more days each week. Do not use any products that contain nicotine or tobacco. These products include cigarettes, chewing tobacco, and vaping devices, such as e-cigarettes. If you need help quitting, ask your health care provider. Do not use drugs. If you are sexually active, practice safe sex. Use a condom or other form of protection in order to prevent STIs. Take aspirin only as told by   your health care provider. Make sure that you understand how much to take and what form to take. Work with your health care provider to find out whether it  is safe and beneficial for you to take aspirin daily. Ask your health care provider if you need to take a cholesterol-lowering medicine (statin). Find healthy ways to manage stress, such as: Meditation, yoga, or listening to music. Journaling. Talking to a trusted person. Spending time with friends and family. Minimize exposure to UV radiation to reduce your risk of skin cancer. Safety Always wear your seat belt while driving or riding in a vehicle. Do not drive: If you have been drinking alcohol. Do not ride with someone who has been drinking. When you are tired or distracted. While texting. If you have been using any mind-altering substances or drugs. Wear a helmet and other protective equipment during sports activities. If you have firearms in your house, make sure you follow all gun safety procedures. What's next? Visit your health care provider once a year for an annual wellness visit. Ask your health care provider how often you should have your eyes and teeth checked. Stay up to date on all vaccines. This information is not intended to replace advice given to you by your health care provider. Make sure you discuss any questions you have with your health care provider. Document Revised: 11/17/2020 Document Reviewed: 11/17/2020 Elsevier Patient Education  [place].

## 2022-05-24 NOTE — Progress Notes (Signed)
Name: Julie Wall   MRN: [id number]    DOB: 1956-03-06   Date:05/25/2022       Progress Note  Subjective  Chief Complaint  Annual Exam  HPI  Patient presents for annual CPE.  Diet: cooks at home, eats balanced  Exercise: discussed regular physical activity   Last Eye Exam: up to date  Last Dental Exam: up to date  [employer] Visit from 05/25/2022 in [facility]  AUDIT-C Score 1      Depression: Phq 9 is  negative    05/25/2022   10:24 AM 09/27/2021   10:05 AM 07/19/2021    9:31 AM 06/29/2021   11:25 AM 02/04/2021    8:04 AM  Depression screen PHQ 2/9  Decreased Interest 0 0 0 0 0  Down, Depressed, Hopeless 0 0 0 0 0  PHQ - 2 Score 0 0 0 0 0  Altered sleeping 0 0 0 0   Tired, decreased energy 0 0 0 0   Change in appetite 0 0 0 0   Feeling bad or failure about yourself  0 0 0 0   Trouble concentrating 0 0 0 0   Moving slowly or fidgety/restless 0 0 0 0   Suicidal thoughts 0 0 0 0   PHQ-9 Score 0 0 0 0    Hypertension: BP Readings from Last 3 Encounters:  05/25/22 110/72  03/02/22 130/72  10/10/21 (!) 120/58   Obesity: Wt Readings from Last 3 Encounters:  05/25/22 161 lb (73 kg)  03/02/22 165 lb (74.8 kg)  10/10/21 166 lb 3.2 oz (75.4 kg)   BMI Readings from Last 3 Encounters:  05/25/22 32.52 kg/m  03/02/22 33.33 kg/m  10/10/21 33.57 kg/m     Vaccines:   RSV: discussed going to local pharmacy  Tdap: 2013, due, she asked to get it done today  Shingrix: she refuses it  Pneumonia: up to date Flu: Due - today  COVID-19: refused    Hep C Screening: 01/27/13 STD testing and prevention (HIV/chl/gon/syphilis): N/A Intimate partner violence: negative screen  Sexual History :not sexually active in over 8 years  Menstrual History/LMP/Abnormal Bleeding: post-menopausal  Discussed importance of follow up if any post-menopausal bleeding: yes  Incontinence Symptoms: negative for symptoms   Breast cancer:  - Last Mammogram:  Ordered 07/19/21 - BRCA gene screening: N/A  Osteoporosis Prevention : Discussed high calcium and vitamin D supplementation, weight bearing exercises Bone density: Ordered 07/19/21   Cervical cancer screening: 10/26/20 due today since last one was abnormal LGIL   Skin cancer: Discussed monitoring for atypical lesions  Colorectal cancer: 07/05/10  , she is due for repeat colonoscopy but want to try cologuard instead  Lung cancer:  Low Dose CT Chest recommended if Age 60-80 years, 20 pack-year currently smoking OR have quit w/in 15 years, she will have it done on January 8 th  ECG: 02/11/20  Advanced Care Planning: A voluntary discussion about advance care planning including the explanation and discussion of advance directives.  Discussed health care proxy and Living will, and the patient was able to identify a health care proxy as daughter .  Patient does not have a living will and power of attorney of health care   Lipids: Lab Results  Component Value Date   CHOL 104 09/27/2021   CHOL 92 10/22/2020   CHOL 91 09/05/2019   Lab Results  Component Value Date   HDL 50 09/27/2021   HDL 45 (L) 10/22/2020   HDL  45 (L) 09/05/2019   Lab Results  Component Value Date   LDLCALC 40 09/27/2021   LDLCALC 34 10/22/2020   LDLCALC 32 09/05/2019   Lab Results  Component Value Date   TRIG 60 09/27/2021   TRIG 46 10/22/2020   TRIG 56 09/05/2019   Lab Results  Component Value Date   CHOLHDL 2.1 09/27/2021   CHOLHDL 2.0 10/22/2020   CHOLHDL 2.0 09/05/2019   No results found for: "LDLDIRECT"  Glucose: Glucose, Bld  Date Value Ref Range Status  09/27/2021 115 (H) 65 - 99 mg/dL Final    Comment:    .            Fasting reference interval . For someone without known diabetes, a glucose value between 100 and 125 mg/dL is consistent with prediabetes and should be confirmed with a follow-up test. .   10/22/2020 120 (H) 65 - 99 mg/dL Final    Comment:    .            Fasting reference  interval . For someone without known diabetes, a glucose value between 100 and 125 mg/dL is consistent with prediabetes and should be confirmed with a follow-up test. .   02/10/2020 293 (H) 70 - 99 mg/dL Final    Comment:    Glucose reference range applies only to samples taken after fasting for at least 8 hours.   Glucose-Capillary  Date Value Ref Range Status  02/11/2020 192 (H) 70 - 99 mg/dL Final    Comment:    Glucose reference range applies only to samples taken after fasting for at least 8 hours.  02/11/2020 224 (H) 70 - 99 mg/dL Final    Comment:    Glucose reference range applies only to samples taken after fasting for at least 8 hours.  02/10/2020 162 (H) 70 - 99 mg/dL Final    Comment:    Glucose reference range applies only to samples taken after fasting for at least 8 hours.    Patient Active Problem List   Diagnosis Date Noted   B12 deficiency 09/27/2021   Hypertension associated with diabetes (HCC) 09/27/2021   Diabetic polyneuropathy associated with type 2 diabetes mellitus (HCC)    Hilar lymphadenopathy 02/05/2020   History of COVID-19 02/05/2020   Atherosclerosis of aorta (HCC) 05/04/2017   Pap smear abnormality of cervix with ASCUS favoring benign 04/23/2017   Vitamin D deficiency 12/14/2016   Obesity (BMI 30.0-34.9) 12/13/2015   Chronic bronchitis (HCC) 05/14/2015   Diabetes mellitus (HCC) 05/14/2015   Positive H. pylori test 04/05/2015   Rapid urease test for Helicobacter pylori infection positive 04/05/2015   Umbilical hernia 04/01/2015   Exomphalos 04/01/2015   RLS (restless legs syndrome) 01/08/2015   Type 2 diabetes mellitus with hyperlipidemia (HCC) 01/08/2015   Neurogenic claudication 01/08/2015   AD (atopic dermatitis) 12/28/2014   Chronic LBP 12/28/2014   Dyslipidemia 12/28/2014   Gastroesophageal reflux disease without esophagitis 12/28/2014   Microalbuminuria 12/28/2014   Disorder of bone and cartilage 12/28/2014   Neuralgia of left  thigh 12/28/2014   Perennial allergic rhinitis with seasonal variation 12/28/2014   Tobacco abuse 12/28/2014   Mononeuropathy of left lower extremity 12/28/2014   Hypertension, benign 09/22/2005    Past Surgical History:  Procedure Laterality Date   CATARACT EXTRACTION Bilateral 1980   CESAREAN SECTION     CORNEAL TRANSPLANT Left    [facility]-Treatment for blindness.    Family History  Problem Relation Age of Onset   Diabetes Mother  Hypertension Mother    Pancreatic cancer Mother    Alcohol abuse Father    Arthritis/Rheumatoid Sister    Heart failure Sister    Diabetes Sister    Kidney disease Sister    Diabetes Brother    Breast cancer Neg Hx     Social History   Socioeconomic History   Marital status: Single    Spouse name: Not on file   Number of children: 3   Years of education: Not on file   Highest education level: High school graduate  Occupational History   Not on file  Tobacco Use   Smoking status: Some Days    Packs/day: 1.00    Years: 46.00    Total pack years: 46.00    Types: Cigarettes    Start date: 01/07/1985    Last attempt to quit: 02/01/2020    Years since quitting: 2.3   Smokeless tobacco: Never   Tobacco comments:    1 pack per week-10/10/2021  Vaping Use   Vaping Use: Never used  Substance and Sexual Activity   Alcohol use: No    Alcohol/week: 0.0 standard drinks of alcohol   Drug use: No   Sexual activity: Not Currently  Other Topics Concern   Not on file  Social History Narrative   Disabled from severe atopic dermatitis   Has 3 children   Lives alone but very connect with family ( sees sister and her children daily)   Also belongs to a church   Social Determinants of Health   Financial Resource Strain: Low Risk  (05/25/2022)   Overall Financial Resource Strain (CARDIA)    Difficulty of Paying Living Expenses: Not hard at all  Food Insecurity: No Food Insecurity (05/25/2022)   Hunger Vital Sign    Worried About Running Out of  Food in the Last Year: Never true    Ran Out of Food in the Last Year: Never true  Transportation Needs: No Transportation Needs (05/25/2022)   PRAPARE - Administrator, Civil Service (Medical): No    Lack of Transportation (Non-Medical): No  Physical Activity: Insufficiently Active (05/25/2022)   Exercise Vital Sign    Days of Exercise per Week: 2 days    Minutes of Exercise per Session: 20 min  Stress: No Stress Concern Present (05/25/2022)   [employer] of Occupational Health - Occupational Stress Questionnaire    Feeling of Stress : Not at all  Social Connections: Moderately Isolated (05/25/2022)   Social Connection and Isolation Panel [NHANES]    Frequency of Communication with Friends and Family: More than three times a week    Frequency of Social Gatherings with Friends and Family: More than three times a week    Attends Religious Services: More than 4 times per year    Active Member of [employer] or Organizations: No    Attends Banker Meetings: Never    Marital Status: Never married  Intimate Partner Violence: Not At Risk (05/25/2022)   Humiliation, Afraid, Rape, and Kick questionnaire    Fear of Current or Ex-Partner: No    Emotionally Abused: No    Physically Abused: No    Sexually Abused: No     Current Outpatient Medications:    ACCU-CHEK AVIVA PLUS test strip, CHECK BLOOD SUGAR ONCE DAILY, Disp: 100 each, Rfl: 2   albuterol (VENTOLIN HFA) 108 (90 Base) MCG/ACT inhaler, Inhale 2 puffs into the lungs every 6 (six) hours as needed for wheezing or shortness of breath., Disp:  18 g, Rfl: 0   Alcohol Swabs (ALCOHOL PREP) 70 % PADS, CHECK BLOOD SUGAR 2 TIMES A DAY, Disp: 200 each, Rfl: 3   AquaLance Lancets 30G MISC, CHECK BLOOD SUGAR 2 TIMES A DAY, Disp: 200 each, Rfl: 3   aspirin 81 MG chewable tablet, Chew 1 tablet by mouth daily., Disp: , Rfl:    atorvastatin (LIPITOR) 40 MG tablet, TAKE 1 TABLET BY MOUTH DAILY (IN PLACE OF SIMVASTATIN), Disp: 90  tablet, Rfl: 1   baclofen (LIORESAL) 10 MG tablet, Take 1 tablet by mouth daily at 12 noon., Disp: , Rfl:    Cholecalciferol 50 MCG (2000 UT) CAPS, Take by mouth., Disp: , Rfl:    clobetasol ointment (TEMOVATE) 0.05 %, Apply 1 application. topically 2 (two) times daily., Disp: , Rfl:    Cyanocobalamin (B-12) 1000 MCG SUBL, Place 1 tablet under the tongue daily., Disp: 100 tablet, Rfl: 1   dorzolamide-timolol (COSOPT) 22.3-6.8 MG/ML ophthalmic solution, Place 1 drop into both eyes 2 (two) times daily. , Disp: , Rfl:    Fluticasone-Umeclidin-Vilant (TRELEGY ELLIPTA) 100-62.5-25 MCG/ACT AEPB, Inhale 1 puff into the lungs daily., Disp: 1 each, Rfl: 5   folic acid (FOLVITE) 1 MG tablet, Take 1 mg by mouth daily. , Disp: , Rfl:    gabapentin (NEURONTIN) 300 MG capsule, Take 1 capsule by mouth 2 (two) times daily., Disp: , Rfl:    HYDROcodone-acetaminophen (NORCO/VICODIN) 5-325 MG tablet, Take 2 tablets by mouth 2 (two) times daily as needed for moderate pain., Disp: 30 tablet, Rfl: 0   JARDIANCE 25 MG TABS tablet, TAKE 1 TABLET BY MOUTH DAILY, Disp: 90 tablet, Rfl: 1   Lancet Devices (SIMPLE DIAGNOSTICS LANCING DEV) MISC, Check blood sugar once daily fasting, and up to 4 times daily PRN., Disp: 100 each, Rfl: 2   metFORMIN (GLUCOPHAGE-XR) 750 MG 24 hr tablet, TAKE 2 TABLETS (1,500 MG TOTAL) BY MOUTHDAILY WITH BREAKFAST, Disp: 180 tablet, Rfl: 1   methotrexate (RHEUMATREX) 2.5 MG tablet, Take 7.5 mg by mouth every Friday., Disp: , Rfl:    Ropinirole HCl 6 MG TB24, Take 1 tablet by mouth at bedtime as needed., Disp: , Rfl:    triamcinolone ointment (KENALOG) 0.1 %, Apply topically 2 (two) times daily., Disp: 30 g, Rfl: 0  Allergies  Allergen Reactions   Penicillins Rash     ROS  Constitutional: Negative for fever or weight change.  Respiratory: Negative for cough and shortness of breath.   Cardiovascular: Negative for chest pain or palpitations.  Gastrointestinal: Negative for abdominal pain, no  bowel changes.  Musculoskeletal: Negative for gait problem or joint swelling.  Skin: chronic eczema is controlled  Neurological: Negative for dizziness or headache.  No other specific complaints in a complete review of systems (except as listed in HPI above).   Objective  Vitals:   05/25/22 1024  BP: 110/72  Pulse: 93  Resp: 16  SpO2: 96%  Weight: 161 lb (73 kg)  Height: 4\' 11"  (1.499 m)    Body mass index is 32.52 kg/m.  Physical Exam  Constitutional: Patient appears well-developed and well-nourished. No distress.  HENT: Head: Normocephalic and atraumatic. Ears: B TMs ok, no erythema or effusion; Nose: Nose normal. Mouth/Throat: Oropharynx is clear and moist. No oropharyngeal exudate.  Eyes: Conjunctivae and EOM are normal. Opaque left eye - legally blind .  Neck: Normal range of motion. Neck supple. No JVD present. No thyromegaly present.  Cardiovascular: Normal rate, regular rhythm and normal heart sounds.  No murmur heard.  No BLE edema. Pulmonary/Chest: Effort normal and breath sounds normal. No respiratory distress. Abdominal: Soft. Bowel sounds are normal, no distension. There is no tenderness. no masses Breast: no lumps or masses, no nipple discharge or rashes FEMALE GENITALIA:  External genitalia normal External urethra normal Vaginal vault normal without discharge or lesions Cervix normal without discharge or lesions Bimanual exam normal without masses RECTAL: not done  Musculoskeletal: Normal range of motion, no joint effusions. No gross deformities Neurological: he is alert and oriented to person, place, and time. No cranial nerve deficit. Coordination, balance, strength, speech and gait are normal.  Skin: Skin is warm and dry. No rash noted. Hypopigmentation of skin due to eczema  Psychiatric: Patient has a normal mood and affect. behavior is normal. Judgment and thought content normal.   Fall Risk:    05/25/2022   10:24 AM 09/27/2021   10:05 AM 07/19/2021     9:32 AM 06/29/2021   11:25 AM 02/04/2021    8:04 AM  Fall Risk   Falls in the past year? 0 0 1 0 0  Number falls in past yr: 0 0 0 0 0  Injury with Fall? 0 0 0 0 0  Risk for fall due to : No Fall Risks No Fall Risks No Fall Risks No Fall Risks No Fall Risks  Follow up Falls prevention discussed Falls prevention discussed Falls prevention discussed Falls prevention discussed Falls prevention discussed     Functional Status Survey: Is the patient deaf or have difficulty hearing?: No Does the patient have difficulty seeing, even when wearing glasses/contacts?: No Does the patient have difficulty concentrating, remembering, or making decisions?: No Does the patient have difficulty walking or climbing stairs?: Yes Does the patient have difficulty dressing or bathing?: No Does the patient have difficulty doing errands alone such as visiting a doctor's office or shopping?: No   Assessment & Plan  1. Well adult exam  - HgB A1c  2. Type 2 diabetes mellitus with hyperlipidemia (HCC)  - HgB A1c  3. Low grade squamous intraepith lesion on cytologic smear cervix (lgsil)  - Cytology - PAP  4. Vitamin D deficiency  - VITAMIN D 25 Hydroxy (Vit-D Deficiency, Fractures)  5. Low magnesium level  - Magnesium  6. Colon cancer screening  - Cologuard  7. Need for Tdap vaccination  - Tdap vaccine greater than or equal to 7yo IM  Explained to the patient she should go to the local pharmacy in case not covered by insurance but she states she wants to get it here , her insurance always paid for it   8. Needs flu shot  - Flu vaccine HIGH DOSE PF (Fluzone High dose)    -USPSTF grade A and B recommendations reviewed with patient; age-appropriate recommendations, preventive care, screening tests, etc discussed and encouraged; healthy living encouraged; see AVS for patient education given to patient -Discussed importance of 150 minutes of physical activity weekly, eat two servings of fish  weekly, eat one serving of tree nuts ( cashews, pistachios, pecans, almonds.Marland Kitchen) every other day, eat 6 servings of fruit/vegetables daily and drink plenty of water and avoid sweet beverages.   -Reviewed Health Maintenance: Yes.

## 2022-05-25 ENCOUNTER — Encounter: Payer: Self-pay | Admitting: Family Medicine

## 2022-05-25 ENCOUNTER — Ambulatory Visit (INDEPENDENT_AMBULATORY_CARE_PROVIDER_SITE_OTHER): Payer: 59 | Admitting: Family Medicine

## 2022-05-25 ENCOUNTER — Other Ambulatory Visit (HOSPITAL_COMMUNITY)
Admission: RE | Admit: 2022-05-25 | Discharge: 2022-05-25 | Disposition: A | Discharge status: Home / Self Care | Payer: 59 | Source: Ambulatory Visit | Attending: Family Medicine | Admitting: Family Medicine

## 2022-05-25 VITALS — BP 110/72 | HR 93 | Resp 16 | Ht 59.0 in | Wt 161.0 lb

## 2022-05-25 DIAGNOSIS — R87610 Atypical squamous cells of undetermined significance on cytologic smear of cervix (ASC-US): Secondary | ICD-10-CM | POA: Diagnosis not present

## 2022-05-25 DIAGNOSIS — R87612 Low grade squamous intraepithelial lesion on cytologic smear of cervix (LGSIL): Secondary | ICD-10-CM | POA: Insufficient documentation

## 2022-05-25 DIAGNOSIS — Z1151 Encounter for screening for human papillomavirus (HPV): Secondary | ICD-10-CM | POA: Diagnosis not present

## 2022-05-25 DIAGNOSIS — R790 Abnormal level of blood mineral: Secondary | ICD-10-CM

## 2022-05-25 DIAGNOSIS — E559 Vitamin D deficiency, unspecified: Secondary | ICD-10-CM

## 2022-05-25 DIAGNOSIS — Z0000 Encounter for general adult medical examination without abnormal findings: Secondary | ICD-10-CM | POA: Diagnosis not present

## 2022-05-25 DIAGNOSIS — Z23 Encounter for immunization: Secondary | ICD-10-CM | POA: Diagnosis not present

## 2022-05-25 DIAGNOSIS — Z124 Encounter for screening for malignant neoplasm of cervix: Secondary | ICD-10-CM | POA: Diagnosis not present

## 2022-05-25 DIAGNOSIS — E785 Hyperlipidemia, unspecified: Secondary | ICD-10-CM

## 2022-05-25 DIAGNOSIS — E1169 Type 2 diabetes mellitus with other specified complication: Secondary | ICD-10-CM

## 2022-05-25 DIAGNOSIS — R87620 Atypical squamous cells of undetermined significance on cytologic smear of vagina (ASC-US): Secondary | ICD-10-CM

## 2022-05-25 DIAGNOSIS — Z1211 Encounter for screening for malignant neoplasm of colon: Secondary | ICD-10-CM

## 2022-05-26 ENCOUNTER — Other Ambulatory Visit: Payer: Self-pay | Admitting: Family Medicine

## 2022-05-26 ENCOUNTER — Encounter: Payer: Medicare Other | Admitting: Family Medicine

## 2022-05-26 DIAGNOSIS — E1169 Type 2 diabetes mellitus with other specified complication: Secondary | ICD-10-CM

## 2022-05-26 DIAGNOSIS — R29818 Other symptoms and signs involving the nervous system: Secondary | ICD-10-CM

## 2022-05-26 DIAGNOSIS — J410 Simple chronic bronchitis: Secondary | ICD-10-CM

## 2022-05-26 LAB — MAGNESIUM: Magnesium: 1.6 mg/dL (ref 1.5–2.5)

## 2022-05-26 LAB — HEMOGLOBIN A1C
Hgb A1c MFr Bld: 7.2 % of total Hgb — ABNORMAL HIGH (ref <5.7)
Mean Plasma Glucose: 160 mg/dL
eAG (mmol/L): 8.9 mmol/L

## 2022-05-26 LAB — VITAMIN D 25 HYDROXY (VIT D DEFICIENCY, FRACTURES): Vit D, 25-Hydroxy: 62 ng/mL (ref 30–100)

## 2022-05-31 ENCOUNTER — Ambulatory Visit: Payer: Medicare Other

## 2022-05-31 LAB — CYTOLOGY - PAP
Comment: NEGATIVE
Diagnosis: UNDETERMINED — AB
High risk HPV: NEGATIVE

## 2022-05-31 NOTE — Progress Notes (Unsigned)
Name: Julie Wall   MRN: [id number]    DOB: July 05, 1955   Date:06/01/2022       Progress Note  Subjective  Chief Complaint  Follow Up  HPI  Diabetes: A1C went up from 6.8 % to 7.6 % , 8.4 %, down to 6.5 % and last checked up to 7.2% , she states not as compliant with diet over the holidays.  She is now taking 1500 mg of Metformin daily and Jardiance  She also has associated obesity, dyslipidemia,  and microalbuminuria. We stopped Losartan because her bp was low. She denies  polyphagia or polydipsia but has polyuria. She is up to date with eye exam. ;last urine micro was negative    Eczema: she sees dermatologist at [facility], Dr. Amada Kingfisher  last visit was 02/2022   She is taking Methotrexate 7.5 mg weekly, and also folic acid , doing well lately   Hyperlipidemia/Atherosclerosis of aorta : she is back  Atorvastatin and aspirin daily , last LDL was 40 . Continue medication    COPD/Intesticial lung disease on CT chest : she had  quit smoking 12/03/2016   but resumed smoking Fall 2019 S he quit again when she developed COVID-19 in 2021, but resumed smoking again since her sister died  Summer 2022 she has been smoking 5 cigarettes daily, explained importance of quitting again .She is now on Trelegy daily now , she denies cough, sob or wheezing at this time,  she sees Dr. Marcos Eke    Chronic back pain with sciatica  . She states right leg is always painful 5/10 when she is very activity  and takes hydrocodone prn  She denies  no bowel or bladder incontinence, she has numbness on left lateral thigh.  She has been seeing Dr. Sherryll Burger who ordered MRI and results below, she was advised to see neurosurgeon and had steroid injections and completed PT - pain is getting worse again, numbness on right foot, difficulty walking ( feels like right leg is dragging). Dr. Marcell Barlow placed referral for her to go back to Dr. Council Mechanic but she has not heard from them yet    IMPRESSION/MRI thoracic spine 09/04/2019     Diffuse degenerative disc narrowing and bulging with small protrusions. Dominant findings at T11-12 where there is cord impingement with flattening and mild T2 hyperintensity by prior lumbar MRI.   IMPRESSION:08/03/2019   1. Moderate spinal stenosis L3-4 unchanged. Moderate subarticular stenosis bilaterally. 2. Severe spinal stenosis with progression since the prior study. Severe subarticular and foraminal stenosis on the left. 3. Moderate subarticular stenosis bilaterally L5-S1. 4. Progressive degenerative changes at T11-12 with significant spinal stenosis and mild cord signal abnormality. Thoracic MRI with dedicated axial images through this level may be helpful.   RLS: She sees neurologist, Dr. Sherryll Burger,  and is on higher dose of Requip 6 mg but symptoms not controlled , advised to keep her regular follow ups with Dr. Sherryll Burger . She still has intermittent symptoms and severe cramps, taking magnesium and last level was normal    Patient Active Problem List   Diagnosis Date Noted   B12 deficiency 09/27/2021   Hypertension associated with diabetes (HCC) 09/27/2021   Diabetic polyneuropathy associated with type 2 diabetes mellitus (HCC)    Hilar lymphadenopathy 02/05/2020   History of COVID-19 02/05/2020   Atherosclerosis of aorta (HCC) 05/04/2017   Pap smear abnormality of cervix with ASCUS favoring benign 04/23/2017   Vitamin D deficiency 12/14/2016   Obesity (BMI 30.0-34.9) 12/13/2015   Chronic bronchitis (HCC)  05/14/2015   Diabetes mellitus (HCC) 05/14/2015   Positive H. pylori test 04/05/2015   Rapid urease test for Helicobacter pylori infection positive 04/05/2015   Umbilical hernia 04/01/2015   Exomphalos 04/01/2015   RLS (restless legs syndrome) 01/08/2015   Type 2 diabetes mellitus with hyperlipidemia (HCC) 01/08/2015   Neurogenic claudication 01/08/2015   AD (atopic dermatitis) 12/28/2014   Chronic LBP 12/28/2014   Dyslipidemia 12/28/2014   Gastroesophageal reflux disease  without esophagitis 12/28/2014   Microalbuminuria 12/28/2014   Disorder of bone and cartilage 12/28/2014   Neuralgia of left thigh 12/28/2014   Perennial allergic rhinitis with seasonal variation 12/28/2014   Tobacco abuse 12/28/2014   Mononeuropathy of left lower extremity 12/28/2014   Hypertension, benign 09/22/2005    Past Surgical History:  Procedure Laterality Date   CATARACT EXTRACTION Bilateral 1980   CESAREAN SECTION     CORNEAL TRANSPLANT Left    [facility]-Treatment for blindness.    Family History  Problem Relation Age of Onset   Diabetes Mother    Hypertension Mother    Pancreatic cancer Mother    Alcohol abuse Father    Arthritis/Rheumatoid Sister    Heart failure Sister    Diabetes Sister    Kidney disease Sister    Diabetes Brother    Breast cancer Neg Hx     Social History   Tobacco Use   Smoking status: Some Days    Packs/day: 1.00    Years: 46.00    Total pack years: 46.00    Types: Cigarettes    Start date: 01/07/1985    Last attempt to quit: 02/01/2020    Years since quitting: 2.3   Smokeless tobacco: Never   Tobacco comments:    1 pack per week-10/10/2021  Substance Use Topics   Alcohol use: No    Alcohol/week: 0.0 standard drinks of alcohol     Current Outpatient Medications:    ACCU-CHEK AVIVA PLUS test strip, CHECK BLOOD SUGAR ONCE DAILY, Disp: 100 each, Rfl: 2   albuterol (VENTOLIN HFA) 108 (90 Base) MCG/ACT inhaler, Inhale 2 puffs into the lungs every 6 (six) hours as needed for wheezing or shortness of breath., Disp: 18 g, Rfl: 0   Alcohol Swabs (ALCOHOL PREP) 70 % PADS, CHECK BLOOD SUGAR 2 TIMES A DAY, Disp: 200 each, Rfl: 3   AquaLance Lancets 30G MISC, CHECK BLOOD SUGAR 2 TIMES A DAY, Disp: 200 each, Rfl: 3   aspirin 81 MG chewable tablet, Chew 1 tablet by mouth daily., Disp: , Rfl:    atorvastatin (LIPITOR) 40 MG tablet, TAKE 1 TABLET BY MOUTH DAILY (IN PLACE OF SIMVASTATIN), Disp: 90 tablet, Rfl: 1   baclofen (LIORESAL) 10 MG tablet,  Take 1 tablet by mouth daily at 12 noon., Disp: , Rfl:    Cholecalciferol 50 MCG (2000 UT) CAPS, Take by mouth., Disp: , Rfl:    clobetasol ointment (TEMOVATE) 0.05 %, Apply 1 application. topically 2 (two) times daily., Disp: , Rfl:    Cyanocobalamin (B-12) 1000 MCG SUBL, Place 1 tablet under the tongue daily., Disp: 100 tablet, Rfl: 1   dorzolamide-timolol (COSOPT) 22.3-6.8 MG/ML ophthalmic solution, Place 1 drop into both eyes 2 (two) times daily. , Disp: , Rfl:    Fluticasone-Umeclidin-Vilant (TRELEGY ELLIPTA) 100-62.5-25 MCG/ACT AEPB, Inhale 1 puff into the lungs daily., Disp: 1 each, Rfl: 5   folic acid (FOLVITE) 1 MG tablet, Take 1 mg by mouth daily. , Disp: , Rfl:    gabapentin (NEURONTIN) 300 MG capsule, Take 1 capsule  by mouth 2 (two) times daily., Disp: , Rfl:    HYDROcodone-acetaminophen (NORCO/VICODIN) 5-325 MG tablet, Take 2 tablets by mouth 2 (two) times daily as needed for moderate pain., Disp: 30 tablet, Rfl: 0   JARDIANCE 25 MG TABS tablet, TAKE 1 TABLET BY MOUTH DAILY, Disp: 90 tablet, Rfl: 1   Lancet Devices (SIMPLE DIAGNOSTICS LANCING DEV) MISC, Check blood sugar once daily fasting, and up to 4 times daily PRN., Disp: 100 each, Rfl: 2   metFORMIN (GLUCOPHAGE-XR) 750 MG 24 hr tablet, TAKE 2 TABLETS (1,500 MG TOTAL) BY MOUTHDAILY WITH BREAKFAST, Disp: 180 tablet, Rfl: 1   methotrexate (RHEUMATREX) 2.5 MG tablet, Take 7.5 mg by mouth every Friday., Disp: , Rfl:    Ropinirole HCl 6 MG TB24, Take 1 tablet by mouth at bedtime as needed., Disp: , Rfl:    triamcinolone ointment (KENALOG) 0.1 %, Apply topically 2 (two) times daily., Disp: 30 g, Rfl: 0  Allergies  Allergen Reactions   Penicillins Rash    I personally reviewed active problem list, medication list, allergies, family history, social history, health maintenance with the patient/caregiver today.   ROS  Constitutional: Negative for fever or weight change.  Respiratory: Negative for cough and shortness of breath.    Cardiovascular: Negative for chest pain or palpitations.  Gastrointestinal: Negative for abdominal pain, no bowel changes.  Musculoskeletal: Negative for gait problem or joint swelling.  Skin: positive for rash.  Neurological: Negative for dizziness or headache.  No other specific complaints in a complete review of systems (except as listed in HPI above).   Objective  Vitals:   06/01/22 0914  BP: 128/72  Pulse: 92  Resp: 18  Temp: 98.4 F (36.9 C)  SpO2: 94%  Weight: 161 lb 4.8 oz (73.2 kg)  Height: 4\' 11"  (1.499 m)    Body mass index is 32.58 kg/m.  Physical Exam  Constitutional: Patient appears well-developed and well-nourished. Obese  No distress.  HEENT: head atraumatic, normocephalic, opaque left pupil , neck supple Cardiovascular: Normal rate, regular rhythm and normal heart sounds.  No murmur heard. No BLE edema. Pulmonary/Chest: Effort normal and breath sounds normal. No respiratory distress. Abdominal: Soft.  There is no tenderness. Muscular skeletal: tender during palpation of lumbar spine, negative straight leg raise normal rom of spine  Skin: hyperpigmentation of face, also has hypopigmentation of arms  Psychiatric: Patient has a normal mood and affect. behavior is normal. Judgment and thought content normal.   Recent Results (from the past 2160 hour(s))  Cytology - PAP     Status: Abnormal   Collection Time: 05/25/22 10:54 AM  Result Value Ref Range   High risk HPV Negative    Adequacy      Satisfactory for evaluation; transformation zone component PRESENT.   Diagnosis (A)     - Atypical squamous cells of undetermined significance (ASC-US)   Microorganisms      Fungal organisms present consistent with Candida spp.   Comment Normal Reference Range HPV - Negative   HgB A1c     Status: Abnormal   Collection Time: 05/25/22 11:18 AM  Result Value Ref Range   Hgb A1c MFr Bld 7.2 (H) <5.7 % of total Hgb    Comment: For someone without known diabetes, a  hemoglobin A1c value of 6.5% or greater indicates that they may have  diabetes and this should be confirmed with a follow-up  test. . For someone with known diabetes, a value <7% indicates  that their diabetes is well controlled  and a value  greater than or equal to 7% indicates suboptimal  control. A1c targets should be individualized based on  duration of diabetes, age, comorbid conditions, and  other considerations. . Currently, no consensus exists regarding use of hemoglobin A1c for diagnosis of diabetes for children. .    Mean Plasma Glucose 160 mg/dL   eAG (mmol/L) 8.9 mmol/L  Magnesium     Status: None   Collection Time: 05/25/22 11:18 AM  Result Value Ref Range   Magnesium 1.6 1.5 - 2.5 mg/dL  VITAMIN D 25 Hydroxy (Vit-D Deficiency, Fractures)     Status: None   Collection Time: 05/25/22 11:18 AM  Result Value Ref Range   Vit D, 25-Hydroxy 62 30 - 100 ng/mL    Comment: Vitamin D Status         25-OH Vitamin D: . Deficiency:                    <20 ng/mL Insufficiency:             20 - 29 ng/mL Optimal:                 > or = 30 ng/mL . For 25-OH Vitamin D testing on patients on  D2-supplementation and patients for whom quantitation  of D2 and D3 fractions is required, the [facility](TM) 25-OH VIT D, (D2,D3), LC/MS/MS is recommended: order  code 81191 (patients >60yrs). . See Note 1 . Note 1 . For additional information, please refer to  http://education.[facility].com/faq/FAQ199  (This link is being provided for informational/ educational purposes only.)     PHQ2/9:    06/01/2022    9:15 AM 05/25/2022   10:24 AM 09/27/2021   10:05 AM 07/19/2021    9:31 AM 06/29/2021   11:25 AM  Depression screen PHQ 2/9  Decreased Interest 0 0 0 0 0  Down, Depressed, Hopeless 0 0 0 0 0  PHQ - 2 Score 0 0 0 0 0  Altered sleeping 0 0 0 0 0  Tired, decreased energy 0 0 0 0 0  Change in appetite 0 0 0 0 0  Feeling bad or failure about yourself  0 0 0 0 0   Trouble concentrating 0 0 0 0 0  Moving slowly or fidgety/restless 0 0 0 0 0  Suicidal thoughts 0 0 0 0 0  PHQ-9 Score 0 0 0 0 0  Difficult doing work/chores Not difficult at all        phq 9 is negative   Fall Risk:    06/01/2022    9:15 AM 05/25/2022   10:24 AM 09/27/2021   10:05 AM 07/19/2021    9:32 AM 06/29/2021   11:25 AM  Fall Risk   Falls in the past year? 0 0 0 1 0  Number falls in past yr: 0 0 0 0 0  Injury with Fall? 0 0 0 0 0  Risk for fall due to :  No Fall Risks No Fall Risks No Fall Risks No Fall Risks  Follow up  Falls prevention discussed Falls prevention discussed Falls prevention discussed Falls prevention discussed    Functional Status Survey: Is the patient deaf or have difficulty hearing?: No Does the patient have difficulty seeing, even when wearing glasses/contacts?: No Does the patient have difficulty concentrating, remembering, or making decisions?: No Does the patient have difficulty walking or climbing stairs?: Yes Does the patient have difficulty dressing or bathing?: No Does the patient have difficulty doing  errands alone such as visiting a doctor's office or shopping?: No    Assessment & Plan  1. Type 2 diabetes mellitus with hyperlipidemia (HCC)  Continue medication   2. Interstitial lung disease (HCC)  Keep follow up with Dr. Marcos EkeGonzales   3. DM type 2 with diabetic peripheral neuropathy (HCC)  - empagliflozin (JARDIANCE) 25 MG TABS tablet; Take 1 tablet (25 mg total) by mouth daily.  Dispense: 90 tablet; Refill: 1  4. Type 2 diabetes mellitus with microalbuminuria, without Kirsten-term current use of insulin (HCC)  - empagliflozin (JARDIANCE) 25 MG TABS tablet; Take 1 tablet (25 mg total) by mouth daily.  Dispense: 90 tablet; Refill: 1  5. Atherosclerosis of aorta (HCC)  On statin therapy   6. Hypertension associated with diabetes (HCC)  BP is at goal , off Losartan due to low bp, but takes Jardiance   7. Vitamin D  deficiency   8. Neurogenic claudication  stable  9. B12 deficiency   10. Other eczema   11. RLS (restless legs syndrome)  On Requip   12. Chronic bilateral low back pain with sciatica, sciatica laterality unspecified  - Ambulatory referral to Pain Clinic  Refill of hydrocodone today, 30 pills lasts her over 6 months

## 2022-06-01 ENCOUNTER — Ambulatory Visit (INDEPENDENT_AMBULATORY_CARE_PROVIDER_SITE_OTHER): Payer: 59 | Admitting: Family Medicine

## 2022-06-01 ENCOUNTER — Encounter: Payer: Self-pay | Admitting: Family Medicine

## 2022-06-01 VITALS — BP 128/72 | HR 92 | Temp 98.4°F | Resp 18 | Ht 59.0 in | Wt 161.3 lb

## 2022-06-01 DIAGNOSIS — E559 Vitamin D deficiency, unspecified: Secondary | ICD-10-CM

## 2022-06-01 DIAGNOSIS — M5440 Lumbago with sciatica, unspecified side: Secondary | ICD-10-CM

## 2022-06-01 DIAGNOSIS — E1169 Type 2 diabetes mellitus with other specified complication: Secondary | ICD-10-CM | POA: Diagnosis not present

## 2022-06-01 DIAGNOSIS — G8929 Other chronic pain: Secondary | ICD-10-CM

## 2022-06-01 DIAGNOSIS — R29818 Other symptoms and signs involving the nervous system: Secondary | ICD-10-CM

## 2022-06-01 DIAGNOSIS — J849 Interstitial pulmonary disease, unspecified: Secondary | ICD-10-CM

## 2022-06-01 DIAGNOSIS — M545 Low back pain, unspecified: Secondary | ICD-10-CM

## 2022-06-01 DIAGNOSIS — I700 Atherosclerosis of aorta: Secondary | ICD-10-CM

## 2022-06-01 DIAGNOSIS — R809 Proteinuria, unspecified: Secondary | ICD-10-CM

## 2022-06-01 DIAGNOSIS — E1129 Type 2 diabetes mellitus with other diabetic kidney complication: Secondary | ICD-10-CM | POA: Diagnosis not present

## 2022-06-01 DIAGNOSIS — G2581 Restless legs syndrome: Secondary | ICD-10-CM

## 2022-06-01 DIAGNOSIS — E785 Hyperlipidemia, unspecified: Secondary | ICD-10-CM

## 2022-06-01 DIAGNOSIS — L308 Other specified dermatitis: Secondary | ICD-10-CM

## 2022-06-01 DIAGNOSIS — E1142 Type 2 diabetes mellitus with diabetic polyneuropathy: Secondary | ICD-10-CM | POA: Diagnosis not present

## 2022-06-01 DIAGNOSIS — E538 Deficiency of other specified B group vitamins: Secondary | ICD-10-CM

## 2022-06-01 DIAGNOSIS — E1159 Type 2 diabetes mellitus with other circulatory complications: Secondary | ICD-10-CM

## 2022-06-01 DIAGNOSIS — I152 Hypertension secondary to endocrine disorders: Secondary | ICD-10-CM

## 2022-06-01 MED ORDER — HYDROCODONE-ACETAMINOPHEN 5-325 MG PO TABS: 2.0000 | ORAL_TABLET | Freq: Two times a day (BID) | ORAL | 0 refills | Status: DC | PRN

## 2022-06-01 MED ORDER — EMPAGLIFLOZIN 25 MG PO TABS: 25.0000 mg | ORAL_TABLET | Freq: Every day | ORAL | 1 refills | Status: DC

## 2022-06-01 MED ORDER — TRELEGY ELLIPTA 100-62.5-25 MCG/ACT IN AEPB: 1.0000 | INHALATION_SPRAY | Freq: Every day | RESPIRATORY_TRACT | 5 refills | Status: DC

## 2022-06-09 DIAGNOSIS — R0683 Snoring: Secondary | ICD-10-CM | POA: Diagnosis not present

## 2022-06-09 DIAGNOSIS — Z79899 Other long term (current) drug therapy: Secondary | ICD-10-CM | POA: Diagnosis not present

## 2022-06-12 ENCOUNTER — Ambulatory Visit
Admission: RE | Admit: 2022-06-12 | Discharge: 2022-06-12 | Disposition: A | Discharge status: Home / Self Care | Payer: Medicare Other | Source: Ambulatory Visit | Attending: Acute Care | Admitting: Acute Care

## 2022-06-12 DIAGNOSIS — F17210 Nicotine dependence, cigarettes, uncomplicated: Secondary | ICD-10-CM | POA: Insufficient documentation

## 2022-06-12 DIAGNOSIS — Z87891 Personal history of nicotine dependence: Secondary | ICD-10-CM | POA: Diagnosis not present

## 2022-06-27 ENCOUNTER — Telehealth: Payer: Self-pay

## 2022-06-27 NOTE — Progress Notes (Signed)
  Chronic Care Management   Note  06/27/2022 Name: Mccayla Shimada MRN: [id number] DOB: 1956-03-01  Adalyna Godbee is a 67 y.o. year old female who is a primary care patient of Steele Sizer, MD. I reached out to [employer] by phone today in response to a referral sent by Ms. [place] PCP.  Ms. Nease was given information about Chronic Care Management services today including:  CCM service includes personalized support from designated clinical staff supervised by the physician, including individualized plan of care and coordination with other care providers 24/7 contact phone numbers for assistance for urgent and routine care needs. Service will only be billed when office clinical staff spend 20 minutes or more in a month to coordinate care. Only one practitioner may furnish and bill the service in a calendar month. The patient may stop CCM services at amy time (effective at the end of the month) by phone call to the office staff. The patient will be responsible for cost sharing (co-pay) or up to 20% of the service fee (after annual deductible is met)  Ms. [name]  agreedto scheduling an appointment with the CCM RN Case Manager   Follow up plan: Patient agreed to scheduled appointment with RN Case Manager on 07/10/2022(date/time).   Noreene Larsson, [place], [place] [address] Direct Dial: [phone] [email or web address]

## 2022-06-28 ENCOUNTER — Ambulatory Visit: Payer: Medicare Other

## 2022-07-05 ENCOUNTER — Other Ambulatory Visit: Payer: Self-pay

## 2022-07-05 ENCOUNTER — Telehealth: Payer: Self-pay | Admitting: Acute Care

## 2022-07-05 DIAGNOSIS — Z122 Encounter for screening for malignant neoplasm of respiratory organs: Secondary | ICD-10-CM

## 2022-07-05 DIAGNOSIS — Z87891 Personal history of nicotine dependence: Secondary | ICD-10-CM

## 2022-07-05 DIAGNOSIS — F17210 Nicotine dependence, cigarettes, uncomplicated: Secondary | ICD-10-CM

## 2022-07-05 NOTE — Telephone Encounter (Signed)
ATC patient to schedule OV. Line rang for >53min with no option to leave vm. Will call back.

## 2022-07-05 NOTE — Telephone Encounter (Signed)
I have called the patient with the results of her low dose CT Chest. I explained that her 6 month follow up scan ( original scan 07/26/2021 scan was read as a LR 3>> 5.8 mm RLL nodule) was read as a LR 2, no significant growth of previously noted nodules. 12 month follow up screening scan recommended.   There was notation of  basilar predominant ILA, and they recommended a Pulmonary consult. She is seen by Dr. Patsey Berthold in the [facility] office. I have notified her of the changes. She suspects these are post inflammatory fibrosis from a previous aspiration event, but would like to see the patient to re-evaluate.  Margie, can you please gat patient scheduled with Dr. Darnell Level as follow up to Fibrotic findings on her  screening CT Chest? Thanks so much

## 2022-07-06 NOTE — Telephone Encounter (Signed)
Spoke to patient and scheduled appt for 08/10/2022 at 10:00. Nothing further needed.

## 2022-07-10 ENCOUNTER — Telehealth: Payer: 59

## 2022-07-12 ENCOUNTER — Ambulatory Visit (INDEPENDENT_AMBULATORY_CARE_PROVIDER_SITE_OTHER): Payer: 59

## 2022-07-12 DIAGNOSIS — E1169 Type 2 diabetes mellitus with other specified complication: Secondary | ICD-10-CM

## 2022-07-12 DIAGNOSIS — E1159 Type 2 diabetes mellitus with other circulatory complications: Secondary | ICD-10-CM

## 2022-07-12 NOTE — Chronic Care Management (AMB) (Signed)
Chronic Care Management   CCM RN Visit Note  07/12/2022 Name: Julie Wall MRN: [id number] DOB: August 19, 1955  Subjective: Julie Wall is a 67 y.o. year old female who is a primary care patient of Steele Sizer, MD. The patient was referred to the Chronic Care Management team for assistance with care management needs subsequent to provider initiation of CCM services and plan of care.    Today's Visit:  Engaged with patient by telephone for initial visit.     SDOH Interventions Today    Flowsheet Row Most Recent Value  SDOH Interventions   Food Insecurity Interventions Intervention Not Indicated  Housing Interventions Intervention Not Indicated  Transportation Interventions Intervention Not Indicated  Utilities Interventions Intervention Not Indicated  Financial Strain Interventions Intervention Not Indicated  Stress Interventions Intervention Not Indicated  Social Connections Interventions Intervention Not Indicated         Goals Addressed             This Visit's Progress    Goal: CCM (Diabetes) Expected Outcome:  Monitor, Self-Manage And Reduce Symptoms of Diabetes       Current Barriers:  Chronic Disease Management support and education needs related to Diabetes  Planned Interventions: Reviewed provider's plan for diabetes management Reviewed medication. Reports taking medications as prescribed. Currently controlled with metformin and Jardiance. Agreed to keep the team updated of concerns regarding prescription cost. Discussed importance of blood glucose readings. Advised to monitor fasting readings daily and maintain a log. Provided information regarding hypoglycemia and hyperglycemia along with appropriate interventions.  Denies recent hypoglycemic or hyperglycemic episodes. Discussed nutritional intake and importance of complying with a diabetic diet. Advised to read nutritional labels. Advised to monitor intake of carbohydrates and avoid foods and beverages with added  sugar when possible.  Discussed importance of completing recommended DM preventive care. Reports completing daily foot care as advised. Advised to ensure eye exam is completed annually. Discussed importance of completing ordered labs as prescribed.  Assessed social determinant of health barriers.   Lab Results  Component Value Date   HGBA1C 7.2 (H) 05/25/2022    Symptom Management: Attend all scheduled provider appointments Take medications as prescribed Monitor blood glucose and maintain a log Check feet daily for cuts, sores or redness Wash and dry feet carefully every day Wear comfortable, cotton socks Wear comfortable, well-fitting shoes Complete annual eye exam Participate in low impact activities/exercises as tolerated Read food labels for fat, fiber, carbohydrates and portion size Call provider office for new concerns or questions   Follow Up Plan:  Will follow up next month       Goal: CCM (Hypertension) Expected Outcome:  Monitor, Self-Manage And Reduce Symptoms of Hypertension       Current Barriers:  Chronic Disease Management support and education needs related to Hypertension  Planned Interventions: Reviewed current treatment plan related to Hypertension, self-management, and adherence to plan as established by provider.  Reviewed medications and indications for use. Reports taking medications as prescribed. Denies concerns r/t medication management or prescription cost. Provided information regarding established blood pressure parameters along with indications for notifying a provider. Advised to monitor BP consistently and record readings.  Reviewed symptoms. Denies chest pain or palpitations. Denies headaches, dizziness, or visual changes.  Discussed compliance with recommended cardiac prudent diet. Encouraged to read nutrition labels, continue monitoring sodium intake, and avoid highly processed foods when possible.  Reviewed s/sx of heart attack, stroke and  worsening symptoms that require immediate medical attention.    BP Readings from Last  3 Encounters:  06/01/22 128/72  05/25/22 110/72  03/02/22 130/72    Symptom Management: Take all medications as prescribed Attend all scheduled provider appointments Call pharmacy for medication refills 3-7 days in advance of running out of medications Check blood pressure  Keep a blood pressure log Call doctor for signs and symptoms of high blood pressure Engage in exercise and mild activity as tolerated Read nutrition labels. Eat whole grains, fruits and vegetables, lean meats and healthy fats Limit salt intake  Call provider office for new concerns or questions   Follow Up Plan:  Will follow up next month         PLAN: A member of the care management team will follow up next month  [place] Manager/Chronic Care Management [phone]

## 2022-07-12 NOTE — Plan of Care (Signed)
Chronic Care Management Provider Comprehensive Care Plan    07/12/2022 Name: Julie Wall MRN: [id number] DOB: [birth year]  Referral to Chronic Care Management (CCM) services was placed by Provider:  Steele Sizer, MD on Date: 05/26/22.  Chronic Condition 1: Diabetes Provider Assessment and Plan  Type 2 diabetes mellitus with microalbuminuria, without [place]-term current use of insulin (HCC) - empagliflozin (JARDIANCE) 25 MG TABS tablet; Take 1 tablet (25 mg total) by mouth daily.  Dispense: 90 tablet; Refill: 1  Expected Outcome/Goals Addressed This Visit (Provider CCM goals/Provider Assessment and plan  Goal: CCM (Diabetes) Expected Outcome:  Monitor, Self-Manage And Reduce Symptoms of Diabetes  Symptom Management Condition 1: Attend all scheduled provider appointments Take medications as prescribed Monitor blood glucose and maintain a log Check feet daily for cuts, sores or redness Wash and dry feet carefully every day Wear comfortable, cotton socks Wear comfortable, well-fitting shoes Complete annual eye exam Participate in low impact activities/exercises as tolerated Read food labels for fat, fiber, carbohydrates and portion size Call provider office for new concerns or questions     Chronic Condition 2: Hypertension Provider Assessment and Plan  Hypertension associated with diabetes ([place]) BP is at goal, off Losartan due to low bp, but takes Jardiance   Expected Outcome/Goals Addressed This Visit (Provider CCM goals/Provider Assessment and plan  Goal: CCM (Hypertension) Expected Outcome:  Monitor, Self-Manage And Reduce Symptoms of Hypertension  Symptom Management Condition 2: Take all medications as prescribed Attend all scheduled provider appointments Call pharmacy for medication refills 3-7 days in advance of running out of medications Check blood pressure  Keep a blood pressure log Call doctor for signs and symptoms of high blood pressure Engage in exercise and  mild activity as tolerated Read nutrition labels. Eat whole grains, fruits and vegetables, lean meats and healthy fats Limit salt intake  Call provider office for new concerns or questions   Problem List Patient Active Problem List   Diagnosis Date Noted   B12 deficiency 09/27/2021   Hypertension associated with diabetes ([place]) 09/27/2021   Diabetic polyneuropathy associated with type 2 diabetes mellitus ([place])    Hilar lymphadenopathy 02/05/2020   History of COVID-19 02/05/2020   Atherosclerosis of aorta ([place]) 05/04/2017   Pap smear abnormality of cervix with ASCUS favoring benign 04/23/2017   Vitamin D deficiency 12/14/2016   Obesity (BMI 30.0-34.9) 12/13/2015   Chronic bronchitis ([place]) 05/14/2015   Diabetes mellitus ([place]) 05/14/2015   Positive H. pylori test 04/05/2015   Rapid urease test for Helicobacter pylori infection positive [id number]   Umbilical hernia [id number]   Exomphalos 04/01/2015   RLS (restless legs syndrome) 01/08/2015   Type 2 diabetes mellitus with hyperlipidemia ([place]) 01/08/2015   Neurogenic claudication 01/08/2015   AD (atopic dermatitis) 12/28/2014   Chronic LBP 12/28/2014   Dyslipidemia 12/28/2014   Gastroesophageal reflux disease without esophagitis 12/28/2014   Microalbuminuria 12/28/2014   Disorder of bone and cartilage 12/28/2014   Neuralgia of left thigh 12/28/2014   Perennial allergic rhinitis with seasonal variation 12/28/2014   Tobacco abuse 12/28/2014   Mononeuropathy of left lower extremity 12/28/2014   Hypertension, benign 09/22/2005    Medication Management  Current Outpatient Medications:    albuterol (VENTOLIN HFA) 108 (90 Base) MCG/ACT inhaler, Inhale 2 puffs into the lungs every 6 (six) hours as needed for wheezing or shortness of breath., Disp: 18 g, Rfl: 0   aspirin 81 MG chewable tablet, Chew 1 tablet by mouth daily., Disp: , Rfl:    atorvastatin (LIPITOR) 40 MG tablet,  TAKE 1 TABLET BY MOUTH DAILY (IN PLACE OF SIMVASTATIN), Disp:  90 tablet, Rfl: 1   dorzolamide-timolol (COSOPT) 22.3-6.8 MG/ML ophthalmic solution, Place 1 drop into both eyes 2 (two) times daily. , Disp: , Rfl:    empagliflozin (JARDIANCE) 25 MG TABS tablet, Take 1 tablet (25 mg total) by mouth daily., Disp: 90 tablet, Rfl: 1   Fluticasone-Umeclidin-Vilant (TRELEGY ELLIPTA) 100-62.5-25 MCG/ACT AEPB, Inhale 1 puff into the lungs daily., Disp: 1 each, Rfl: 5   folic acid (FOLVITE) 1 MG tablet, Take 1 mg by mouth daily. , Disp: , Rfl:    gabapentin (NEURONTIN) 300 MG capsule, Take 1 capsule by mouth 2 (two) times daily., Disp: , Rfl:    metFORMIN (GLUCOPHAGE-XR) 750 MG 24 hr tablet, TAKE 2 TABLETS (1,500 MG TOTAL) BY MOUTHDAILY WITH BREAKFAST, Disp: 180 tablet, Rfl: 1   ACCU-CHEK AVIVA PLUS test strip, CHECK BLOOD SUGAR ONCE DAILY, Disp: 100 each, Rfl: 2   Alcohol Swabs (ALCOHOL PREP) 70 % PADS, CHECK BLOOD SUGAR 2 TIMES A DAY, Disp: 200 each, Rfl: 3   AquaLance Lancets 30G MISC, CHECK BLOOD SUGAR 2 TIMES A DAY, Disp: 200 each, Rfl: 3   baclofen (LIORESAL) 10 MG tablet, Take 1 tablet by mouth daily at 12 noon., Disp: , Rfl:    Cholecalciferol 50 MCG (2000 UT) CAPS, Take by mouth., Disp: , Rfl:    clobetasol ointment (TEMOVATE) [id number] %, Apply 1 application. topically 2 (two) times daily., Disp: , Rfl:    Cyanocobalamin (B-12) 1000 MCG SUBL, Place 1 tablet under the tongue daily., Disp: 100 tablet, Rfl: 1   HYDROcodone-acetaminophen (NORCO/VICODIN) 5-325 MG tablet, Take 2 tablets by mouth 2 (two) times daily as needed for moderate pain., Disp: 30 tablet, Rfl: 0   Lancet Devices (SIMPLE DIAGNOSTICS LANCING DEV) MISC, Check blood sugar once daily fasting, and up to 4 times daily PRN., Disp: 100 each, Rfl: 2   methotrexate (RHEUMATREX) 2.5 MG tablet, Take 7.5 mg by mouth every Friday., Disp: , Rfl:    Ropinirole HCl 6 MG TB24, Take 1 tablet by mouth at bedtime as needed., Disp: , Rfl:    triamcinolone ointment (KENALOG) 0.1 %, Apply topically 2 (two) times daily.,  Disp: 30 g, Rfl: 0  Cognitive Assessment Identity Confirmed: : Name; DOB Cognitive Status: Normal   Functional Assessment Hearing Difficulty or Deaf: no Wear Glasses or Blind: yes Vision Management: Glasses Concentrating, Remembering or Making Decisions Difficulty (CP): no Difficulty Communicating: no Difficulty Eating/Swallowing: no Walking or Climbing Stairs Difficulty: no Dressing/Bathing Difficulty: no Doing Errands Independently Difficulty (such as shopping) (CP): no Change in Functional Status Since Onset of Current Illness/Injury: no   Caregiver Assessment  Primary Source of Support/Comfort: child(ren); community People in Home: alone Family Caregiver if Needed: none Primary Roles/Responsibilities: caregiver for other(s)   Planned Interventions  Diabetes Reviewed provider's plan for diabetes management Reviewed medication. Reports taking medications as prescribed. Currently controlled with metformin and Jardiance. Agreed to keep the team updated of concerns regarding prescription cost. Discussed importance of blood glucose readings. Advised to monitor fasting readings daily and maintain a log. Provided information regarding hypoglycemia and hyperglycemia along with appropriate interventions.  Denies recent hypoglycemic or hyperglycemic episodes. Discussed nutritional intake and importance of complying with a diabetic diet. Advised to read nutritional labels. Advised to monitor intake of carbohydrates and avoid foods and beverages with added sugar when possible.  Discussed importance of completing recommended DM preventive care. Reports completing daily foot care as advised. Advised to ensure eye exam  is completed annually. Discussed importance of completing ordered labs as prescribed.  Assessed social determinant of health barriers.  Hypertension Reviewed current treatment plan related to Hypertension, self-management, and adherence to plan as established by provider.   Reviewed medications and indications for use. Reports taking medications as prescribed. Denies concerns r/t medication management or prescription cost. Provided information regarding established blood pressure parameters along with indications for notifying a provider. Advised to monitor BP consistently and record readings.  Reviewed symptoms. Denies chest pain or palpitations. Denies headaches, dizziness, or visual changes.  Discussed compliance with recommended cardiac prudent diet. Encouraged to read nutrition labels, continue monitoring sodium intake, and avoid highly processed foods when possible.  Reviewed s/sx of heart attack, stroke and worsening symptoms that require immediate medical attention.   Interaction and coordination with outside resources, practitioners, and providers See CCM Referral  Care Plan: Printed and mailed to patient

## 2022-07-12 NOTE — Patient Instructions (Addendum)
Thank you for allowing the Chronic Care Management team to participate in your care. It was great speaking with you!    Following is a copy of the CCM Program Consent:  CCM service includes personalized support from designated clinical staff supervised by the physician, including individualized plan of care and coordination with other care providers 24/7 contact phone numbers for assistance for urgent and routine care needs. Service will only be billed when office clinical staff spend 20 minutes or more in a month to coordinate care. Only one practitioner may furnish and bill the service in a calendar month. The patient may stop CCM services at amy time (effective at the end of the month) by phone call to the office staff. The patient will be responsible for cost sharing (co-pay) or up to 20% of the service fee (after annual deductible is met)  Following is a copy of your full provider care plan:   Goals Addressed             This Visit's Progress    Goal: CCM (Diabetes) Expected Outcome:  Monitor, Self-Manage And Reduce Symptoms of Diabetes       Current Barriers:  Chronic Disease Management support and education needs related to Diabetes  Planned Interventions: Reviewed provider's plan for diabetes management Reviewed medication. Reports taking medications as prescribed. Currently controlled with metformin and Jardiance. Agreed to keep the team updated of concerns regarding prescription cost. Discussed importance of blood glucose readings. Advised to monitor fasting readings daily and maintain a log. Provided information regarding hypoglycemia and hyperglycemia along with appropriate interventions.  Denies recent hypoglycemic or hyperglycemic episodes. Discussed nutritional intake and importance of complying with a diabetic diet. Advised to read nutritional labels. Advised to monitor intake of carbohydrates and avoid foods and beverages with added sugar when possible.  Discussed  importance of completing recommended DM preventive care. Reports completing daily foot care as advised. Advised to ensure eye exam is completed annually. Discussed importance of completing ordered labs as prescribed.  Assessed social determinant of health barriers.   Lab Results  Component Value Date   HGBA1C 7.2 (H) 05/25/2022    Symptom Management: Attend all scheduled provider appointments Take medications as prescribed Monitor blood glucose and maintain a log Check feet daily for cuts, sores or redness Wash and dry feet carefully every day Wear comfortable, cotton socks Wear comfortable, well-fitting shoes Complete annual eye exam Participate in low impact activities/exercises as tolerated Read food labels for fat, fiber, carbohydrates and portion size Call provider office for new concerns or questions   Follow Up Plan:  Will follow up next month       Goal: CCM (Hypertension) Expected Outcome:  Monitor, Self-Manage And Reduce Symptoms of Hypertension       Current Barriers:  Chronic Disease Management support and education needs related to Hypertension  Planned Interventions: Reviewed current treatment plan related to Hypertension, self-management, and adherence to plan as established by provider.  Reviewed medications and indications for use. Reports taking medications as prescribed. Denies concerns r/t medication management or prescription cost. Provided information regarding established blood pressure parameters along with indications for notifying a provider. Advised to monitor BP consistently and record readings.  Reviewed symptoms. Denies chest pain or palpitations. Denies headaches, dizziness, or visual changes.  Discussed compliance with recommended cardiac prudent diet. Encouraged to read nutrition labels, continue monitoring sodium intake, and avoid highly processed foods when possible.  Reviewed s/sx of heart attack, stroke and worsening symptoms that require  immediate medical  attention.    BP Readings from Last 3 Encounters:  06/01/22 128/72  05/25/22 110/72  03/02/22 130/72    Symptom Management: Take all medications as prescribed Attend all scheduled provider appointments Call pharmacy for medication refills 3-7 days in advance of running out of medications Check blood pressure  Keep a blood pressure log Call doctor for signs and symptoms of high blood pressure Engage in exercise and mild activity as tolerated Read nutrition labels. Eat whole grains, fruits and vegetables, lean meats and healthy fats Limit salt intake  Call provider office for new concerns or questions   Follow Up Plan:  Will follow up next month          Ms. Dawson verbalized understanding of instructions, educational materials, and care plan provided today. Agreed to receive a mailed copy of patient instructions, educational materials, and care plan.   A member of the care management team will follow up next month.     Horris Latino RN Care Manager/Chronic Care Management [phone]

## 2022-07-20 ENCOUNTER — Ambulatory Visit (INDEPENDENT_AMBULATORY_CARE_PROVIDER_SITE_OTHER): Payer: 59

## 2022-07-20 VITALS — Ht 59.0 in | Wt 161.0 lb

## 2022-07-20 DIAGNOSIS — Z0000 Encounter for general adult medical examination without abnormal findings: Secondary | ICD-10-CM

## 2022-07-20 DIAGNOSIS — Z13820 Encounter for screening for osteoporosis: Secondary | ICD-10-CM

## 2022-07-20 DIAGNOSIS — Z1231 Encounter for screening mammogram for malignant neoplasm of breast: Secondary | ICD-10-CM

## 2022-07-20 NOTE — Patient Instructions (Signed)
Julie Wall , Thank you for taking time to come for your Medicare Wellness Visit. I appreciate your ongoing commitment to your health goals. Please review the following plan we discussed and let me know if I can assist you in the future.   These are the goals we discussed:  Goals      Monitor and Manage My Blood Sugar-Diabetes Type 2     Timeframe:  Pain-Range Goal Priority:  High Start Date: 03/02/21                            Expected End Date: 03/02/22                      Follow Up within 90 days   - check blood sugar at prescribed times - check blood sugar before and after exercise - check blood sugar if I feel it is too high or too low - take the blood sugar meter to all doctor visits    Why is this important?   Checking your blood sugar at home helps to keep it from getting very high or very low.  Writing the results in a diary or log helps the doctor know how to care for you.  Your blood sugar log should have the time, date and the results.  Also, write down the amount of insulin or other medicine that you take.  Other information, like what you ate, exercise done and how you were feeling, will also be helpful.     Notes:      Patient Stated     67 to [place] exam within last year, due in may Checking feet daily  Not checking blood pressure No chest pain   BP Readings from Last 3 Encounters:  06/01/22 128/72  05/25/22 110/72  03/02/22 130/72    Send more information Mail Watch, include nutrition  Drink diet soda,  Cup of sweet tea every now and them     Patient Stated     Quit Smoking     Pt would like to stop smoking completely.        This is a list of the screening recommended for you and due dates:  Health Maintenance  Topic Date Due   COVID-19 Vaccine (1) Never done   Colon Cancer Screening  07/05/2020   Mammogram  09/16/2021   Medicare Annual Wellness Visit  07/19/2022   Complete foot exam   06/29/2022   Zoster (Shingles) Vaccine (1 of 2)  08/23/2022*   Yearly kidney function blood test for diabetes  09/28/2022   Yearly kidney health urinalysis for diabetes  09/28/2022   Eye exam for diabetics  11/02/2022   Hemoglobin A1C  11/24/2022   Pap Smear  05/26/2023   Screening for Lung Cancer  06/13/2023   DTaP/Tdap/Td vaccine (3 - Td or Tdap) 05/25/2032   Pneumonia Vaccine  Completed   Flu Shot  Completed   DEXA scan (bone density measurement)  Completed   Hepatitis C Screening: USPSTF Recommendation to screen - Ages 68-68 yo.  Completed   HPV Vaccine  Aged Out  *Topic was postponed. The date shown is not the original due date.    Advanced directives: no mailed paperwork  Conditions/risks identified: none  Next appointment: Follow up in one year for your annual wellness visit 07/24/2023 @ 0915 telephone   Preventive Care 65 Years and Older, Female Preventive care refers to lifestyle choices  and visits with your health care provider that can promote health and wellness. What does preventive care include? A yearly physical exam. This is also called an annual well check. Dental exams once or twice a year. Routine eye exams. Ask your health care provider how often you should have your eyes checked. Personal lifestyle choices, including: Daily care of your teeth and gums. Regular physical activity. Eating a healthy diet. Avoiding tobacco and drug use. Limiting alcohol use. Practicing safe sex. Taking low-dose aspirin every day. Taking vitamin and mineral supplements as recommended by your health care provider. What happens during an annual well check? The services and screenings done by your health care provider during your annual well check will depend on your age, overall health, lifestyle risk factors, and family history of disease. Counseling  Your health care provider may ask you questions about your: Alcohol use. Tobacco use. Drug use. Emotional well-being. Home and relationship well-being. Sexual  activity. Eating habits. History of falls. Memory and ability to understand (cognition). Work and work Statistician. Reproductive health. Screening  You may have the following tests or measurements: Height, weight, and BMI. Blood pressure. Lipid and cholesterol levels. These may be checked every 5 years, or more frequently if you are over 22 years old. Skin check. Lung cancer screening. You may have this screening every year starting at age 5 if you have a 30-pack-year history of smoking and currently smoke or have quit within the past 15 years. Fecal occult blood test (FOBT) of the stool. You may have this test every year starting at age 37. Flexible sigmoidoscopy or colonoscopy. You may have a sigmoidoscopy every 5 years or a colonoscopy every 10 years starting at age 46. Hepatitis C blood test. Hepatitis B blood test. Sexually transmitted disease (STD) testing. Diabetes screening. This is done by checking your blood sugar (glucose) after you have not eaten for a while (fasting). You may have this done every 1-3 years. Bone density scan. This is done to screen for osteoporosis. You may have this done starting at age 52. Mammogram. This may be done every 1-2 years. Talk to your health care provider about how often you should have regular mammograms. Talk with your health care provider about your test results, treatment options, and if necessary, the need for more tests. Vaccines  Your health care provider may recommend certain vaccines, such as: Influenza vaccine. This is recommended every year. Tetanus, diphtheria, and acellular pertussis (Tdap, Td) vaccine. You may need a Td booster every 10 years. Zoster vaccine. You may need this after age 17. Pneumococcal 13-valent conjugate (PCV13) vaccine. One dose is recommended after age 24. Pneumococcal polysaccharide (PPSV23) vaccine. One dose is recommended after age 55. Talk to your health care provider about which screenings and vaccines  you need and how often you need them. This information is not intended to replace advice given to you by your health care provider. Make sure you discuss any questions you have with your health care provider. Document Released: 06/18/2015 Document Revised: 02/09/2016 Document Reviewed: 03/23/2015 Elsevier Interactive Patient Education  2017 [place] Prevention in the Home Falls can cause injuries. They can happen to people of all ages. There are many things you can do to make your home safe and to help prevent falls. What can I do on the outside of my home? Regularly fix the edges of walkways and driveways and fix any cracks. Remove anything that might make you trip as you walk through a door, such  as a raised step or threshold. Trim any bushes or trees on the path to your home. Use bright outdoor lighting. Clear any walking paths of anything that might make someone trip, such as rocks or tools. Regularly check to see if handrails are loose or broken. Make sure that both sides of any steps have handrails. Any raised decks and porches should have guardrails on the edges. Have any leaves, snow, or ice cleared regularly. Use sand or salt on walking paths during winter. Clean up any spills in your garage right away. This includes oil or grease spills. What can I do in the bathroom? Use night lights. Install grab bars by the toilet and in the tub and shower. Do not use towel bars as grab bars. Use non-skid mats or decals in the tub or shower. If you need to sit down in the shower, use a plastic, non-slip stool. Keep the floor dry. Clean up any water that spills on the floor as soon as it happens. Remove soap buildup in the tub or shower regularly. Attach bath mats securely with double-sided non-slip rug tape. Do not have throw rugs and other things on the floor that can make you trip. What can I do in the bedroom? Use night lights. Make sure that you have a light by your bed that  is easy to reach. Do not use any sheets or blankets that are too big for your bed. They should not hang down onto the floor. Have a firm chair that has side arms. You can use this for support while you get dressed. Do not have throw rugs and other things on the floor that can make you trip. What can I do in the kitchen? Clean up any spills right away. Avoid walking on wet floors. Keep items that you use a lot in easy-to-reach places. If you need to reach something above you, use a strong step stool that has a grab bar. Keep electrical cords out of the way. Do not use floor polish or wax that makes floors slippery. If you must use wax, use non-skid floor wax. Do not have throw rugs and other things on the floor that can make you trip. What can I do with my stairs? Do not leave any items on the stairs. Make sure that there are handrails on both sides of the stairs and use them. Fix handrails that are broken or loose. Make sure that handrails are as [place] as the stairways. Check any carpeting to make sure that it is firmly attached to the stairs. Fix any carpet that is loose or worn. Avoid having throw rugs at the top or bottom of the stairs. If you do have throw rugs, attach them to the floor with carpet tape. Make sure that you have a light switch at the top of the stairs and the bottom of the stairs. If you do not have them, ask someone to add them for you. What else can I do to help prevent falls? Wear shoes that: Do not have high heels. Have rubber bottoms. Are comfortable and fit you well. Are closed at the toe. Do not wear sandals. If you use a stepladder: Make sure that it is fully opened. Do not climb a closed stepladder. Make sure that both sides of the stepladder are locked into place. Ask someone to hold it for you, if possible. Clearly mark and make sure that you can see: Any grab bars or handrails. First and last steps. Where the edge  of each step is. Use tools that help you  move around (mobility aids) if they are needed. These include: Canes. Walkers. Scooters. Crutches. Turn on the lights when you go into a dark area. Replace any light bulbs as soon as they burn out. Set up your furniture so you have a clear path. Avoid moving your furniture around. If any of your floors are uneven, fix them. If there are any pets around you, be aware of where they are. Review your medicines with your doctor. Some medicines can make you feel dizzy. This can increase your chance of falling. Ask your doctor what other things that you can do to help prevent falls. This information is not intended to replace advice given to you by your health care provider. Make sure you discuss any questions you have with your health care provider. Document Released: 03/18/2009 Document Revised: 10/28/2015 Document Reviewed: 06/26/2014 Elsevier Interactive Patient Education  2017 [employer].

## 2022-07-20 NOTE — Progress Notes (Signed)
I connected with  Jazzlyn Bera on 07/20/22 by a audio enabled telemedicine application and verified that I am speaking with the correct person using two identifiers.  Patient Location: Home  Provider Location: Office/Clinic  I discussed the limitations of evaluation and management by telemedicine. The patient expressed understanding and agreed to proceed.  Subjective:   Julie Wall is a 67 y.o. female who presents for Medicare Annual (Subsequent) preventive examination.  Review of Systems    Cardiac Risk Factors include: advanced age (>73mn, >>13women);dyslipidemia;hypertension;obesity (BMI >30kg/m2);smoking/ tobacco exposure;sedentary lifestyle    Objective:    Today's Vitals   07/20/22 0915  Weight: 161 lb (73 kg)  Height: 4' 11"$  (1.499 m)   Body mass index is 32.52 kg/m.     07/20/2022    9:30 AM 07/19/2021    9:32 AM 07/01/2020    3:35 PM 06/12/2019    8:57 AM 05/23/2018    9:19 AM 03/16/2017   11:32 AM 12/05/2016    2:58 PM  Advanced Directives  Does Patient Have a Medical Advance Directive? No No No No No No No  Would patient like information on creating a medical advance directive?  No - Patient declined No - Patient declined No - Patient declined No - Patient declined      Current Medications (verified) Outpatient Encounter Medications as of 07/20/2022  Medication Sig   ACCU-CHEK AVIVA PLUS test strip CHECK BLOOD SUGAR ONCE DAILY   Alcohol Swabs (ALCOHOL PREP) 70 % PADS CHECK BLOOD SUGAR 2 TIMES A DAY   AquaLance Lancets 30G MISC CHECK BLOOD SUGAR 2 TIMES A DAY   aspirin 81 MG chewable tablet Chew 1 tablet by mouth daily.   atorvastatin (LIPITOR) 40 MG tablet TAKE 1 TABLET BY MOUTH DAILY (IN PLACE OF SIMVASTATIN)   Cholecalciferol 50 MCG (2000 UT) CAPS Take by mouth.   Cyanocobalamin (B-12) 1000 MCG SUBL Place 1 tablet under the tongue daily.   dorzolamide-timolol (COSOPT) 22.3-6.8 MG/ML ophthalmic solution Place 1 drop into both eyes 2 (two) times daily.     empagliflozin (JARDIANCE) 25 MG TABS tablet Take 1 tablet (25 mg total) by mouth daily.   Fluticasone-Umeclidin-Vilant (TRELEGY ELLIPTA) 100-62.5-25 MCG/ACT AEPB Inhale 1 puff into the lungs daily.   folic acid (FOLVITE) 1 MG tablet Take 1 mg by mouth daily.    gabapentin (NEURONTIN) 300 MG capsule Take 1 capsule by mouth 2 (two) times daily.   HYDROcodone-acetaminophen (NORCO/VICODIN) 5-325 MG tablet Take 2 tablets by mouth 2 (two) times daily as needed for moderate pain.   Lancet Devices (SIMPLE DIAGNOSTICS LANCING DEV) MISC Check blood sugar once daily fasting, and up to 4 times daily PRN.   metFORMIN (GLUCOPHAGE-XR) 750 MG 24 hr tablet TAKE 2 TABLETS (1,500 MG TOTAL) BY MOUTHDAILY WITH BREAKFAST   methotrexate (RHEUMATREX) 2.5 MG tablet Take 7.5 mg by mouth every Friday.   triamcinolone ointment (KENALOG) 0.1 % Apply topically 2 (two) times daily.   albuterol (VENTOLIN HFA) 108 (90 Base) MCG/ACT inhaler Inhale 2 puffs into the lungs every 6 (six) hours as needed for wheezing or shortness of breath.   baclofen (LIORESAL) 10 MG tablet Take 1 tablet by mouth daily at 12 noon. (Patient not taking: Reported on 07/20/2022)   clobetasol ointment (TEMOVATE) 0AB-123456789% Apply 1 application. topically 2 (two) times daily. (Patient not taking: Reported on 07/20/2022)   Ropinirole HCl 6 MG TB24 Take 1 tablet by mouth at bedtime as needed. (Patient not taking: Reported on 07/20/2022)   No facility-administered  encounter medications on file as of 07/20/2022.    Allergies (verified) Penicillins   History: Past Medical History:  Diagnosis Date   Allergy    ASCUS favor benign 09/2013   negative HPV   Atopic dermatitis    Dermatologist at [facility]   Chronic low back pain    COPD (chronic obstructive pulmonary disease) (HCC)    Decreased exercise tolerance    Dental caries    Diabetes mellitus without complication (HCC)    GERD (gastroesophageal reflux disease)    Hyperlipidemia    Hypertension    Lumbosacral  neuritis    Microalbuminuria    Osteopenia    Ovarian failure    Tobacco use    Past Surgical History:  Procedure Laterality Date   CATARACT EXTRACTION Bilateral 1980   CESAREAN SECTION     CORNEAL TRANSPLANT Left    [facility]-Treatment for blindness.   Family History  Problem Relation Age of Onset   Diabetes Mother    Hypertension Mother    Pancreatic cancer Mother    Alcohol abuse Father    Arthritis/Rheumatoid Sister    Heart failure Sister    Diabetes Sister    Kidney disease Sister    Diabetes Brother    Breast cancer Neg Hx    Social History   Socioeconomic History   Marital status: Single    Spouse name: Not on file   Number of children: 3   Years of education: Not on file   Highest education level: High school graduate  Occupational History   Not on file  Tobacco Use   Smoking status: Some Days    Packs/day: 1.00    Years: 46.00    Total pack years: 46.00    Types: Cigarettes    Start date: 01/07/1985    Last attempt to quit: 02/01/2020    Years since quitting: 2.4   Smokeless tobacco: Never   Tobacco comments:    1 pack per week-10/10/2021  Vaping Use   Vaping Use: Never used  Substance and Sexual Activity   Alcohol use: No    Alcohol/week: 0.0 standard drinks of alcohol   Drug use: No   Sexual activity: Not Currently  Other Topics Concern   Not on file  Social History Narrative   Disabled from severe atopic dermatitis   Has 3 children   Lives alone but very connect with family ( sees sister and her children daily)   Also belongs to a church   Social Determinants of Health   Financial Resource Strain: Low Risk  (07/20/2022)   Overall Financial Resource Strain (CARDIA)    Difficulty of Paying Living Expenses: Not hard at all  Food Insecurity: No Food Insecurity (07/20/2022)   Hunger Vital Sign    Worried About Running Out of Food in the Last Year: Never true    Ran Out of Food in the Last Year: Never true  Transportation Needs: No Transportation  Needs (07/20/2022)   PRAPARE - Hydrologist (Medical): No    Lack of Transportation (Non-Medical): No  Physical Activity: Sufficiently Active (07/20/2022)   Exercise Vital Sign    Days of Exercise per Week: 7 days    Minutes of Exercise per Session: 30 min  Recent Concern: Physical Activity - Insufficiently Active (05/25/2022)   Exercise Vital Sign    Days of Exercise per Week: 2 days    Minutes of Exercise per Session: 20 min  Stress: No Stress Concern Present (07/20/2022)  [employer] - Occupational Stress Questionnaire    Feeling of Stress : Not at all  Social Connections: Moderately Isolated (07/20/2022)   Social Connection and Isolation Panel [NHANES]    Frequency of Communication with Friends and Family: More than three times a week    Frequency of Social Gatherings with Friends and Family: More than three times a week    Attends Religious Services: More than 4 times per year    Active Member of [employer] or Organizations: No    Attends Music therapist: Never    Marital Status: Never married    Tobacco Counseling Ready to quit: Not Answered Counseling given: Not Answered Tobacco comments: 1 pack per week-10/10/2021   Clinical Intake:  Pre-visit preparation completed: Yes  Pain : No/denies pain     BMI - recorded: 32.52 Nutritional Status: BMI > 30  Obese Nutritional Risks: None Diabetes: Yes CBG done?: No Did pt. bring in CBG monitor from home?: No  How often do you need to have someone help you when you read instructions, pamphlets, or other written materials from your doctor or pharmacy?: 1 - Never  Diabetic?yes  Interpreter Needed?: No  Information entered by :: B.Dreamer Carillo,LPN   Activities of Daily Living    07/20/2022    9:32 AM 06/01/2022    9:15 AM  In your present state of health, do you have any difficulty performing the following activities:  Hearing? 0 0  Vision? 0 0  Difficulty  concentrating or making decisions? 0 0  Walking or climbing stairs? 1 1  Dressing or bathing? 0 0  Doing errands, shopping? 0 0  Preparing Food and eating ? N   Using the Toilet? N   In the past six months, have you accidently leaked urine? N   Do you have problems with loss of bowel control? N   Managing your Medications? N   Managing your Finances? N   Housekeeping or managing your Housekeeping? N     Patient Care Team: Steele Sizer, MD as PCP - General (Family Medicine) Ernesto Rutherford, MD as Referring Physician (Dermatology) Vladimir Crofts, MD as Consulting Physician (Neurology) Germaine Pomfret, [facility] as Pharmacist (Pharmacist) Neldon Labella, RN as Case Manager  Indicate any recent Medical Services you may have received from other than [facility] providers in the past year (date may be approximate).     Assessment:   This is a routine wellness examination for Heela.  Hearing/Vision screen Hearing Screening - Comments:: Adequate hearing Vision Screening - Comments:: Adequate vision [facility]  Dietary issues and exercise activities discussed: Exercise limited by: neurologic condition(s);orthopedic condition(s)   Goals Addressed             This Visit's Progress    Monitor and Manage My Blood Sugar-Diabetes Type 2   Not on track    Timeframe:  [place] Goal Priority:  High Start Date: 03/02/21                            Expected End Date: 03/02/22                      Follow Up within 90 days   - check blood sugar at prescribed times - check blood sugar before and after exercise - check blood sugar if I feel it is too high or too low - take the blood sugar meter to all  doctor visits    Why is this important?   Checking your blood sugar at home helps to keep it from getting very high or very low.  Writing the results in a diary or log helps the doctor know how to care for you.  Your blood sugar log should have the time, date and the results.   Also, write down the amount of insulin or other medicine that you take.  Other information, like what you ate, exercise done and how you were feeling, will also be helpful.     Notes:      Patient Stated   On track    105 to [place] exam within last year, due in may Checking feet daily  Not checking blood pressure No chest pain   BP Readings from Last 3 Encounters:  06/01/22 128/72  05/25/22 110/72  03/02/22 130/72    Send more information Mail Watch, include nutrition  Drink diet soda,  Cup of sweet tea every now and them     Quit Smoking   Not on track    Pt would like to stop smoking completely.       Depression Screen    07/20/2022    9:21 AM 07/12/2022    4:22 PM 06/01/2022    9:15 AM 05/25/2022   10:24 AM 09/27/2021   10:05 AM 07/19/2021    9:31 AM 06/29/2021   11:25 AM  PHQ 2/9 Scores  PHQ - 2 Score 0 0 0 0 0 0 0  PHQ- 9 Score 0  0 0 0 0 0    Fall Risk    07/20/2022    9:18 AM 07/12/2022    4:04 PM 06/01/2022    9:15 AM 05/25/2022   10:24 AM 09/27/2021   10:05 AM  Fall Risk   Falls in the past year? 0 0 0 0 0  Number falls in past yr: 0 0 0 0 0  Injury with Fall? 0 0 0 0 0  Risk for fall due to : No Fall Risks Medication side effect  No Fall Risks No Fall Risks  Follow up Education provided;Falls prevention discussed Falls prevention discussed  Falls prevention discussed Falls prevention discussed    FALL RISK PREVENTION PERTAINING TO THE HOME:  Any stairs in or around the home? Yes  If so, are there any without handrails? Yes  Home free of loose throw rugs in walkways, pet beds, electrical cords, etc? yes Adequate lighting in your home to reduce risk of falls? Yes   ASSISTIVE DEVICES UTILIZED TO PREVENT FALLS:  Life alert? No  Use of a cane, walker or w/c? No  Grab bars in the bathroom? no Shower chair or bench in shower? Yes  Elevated toilet seat or a handicapped toilet? No    Cognitive Function:        07/20/2022    9:33 AM  06/12/2019    9:01 AM 05/23/2018    9:24 AM  6CIT Screen  What Year? 0 points 0 points 0 points  What month? 0 points 0 points 0 points  What time? 0 points 0 points 0 points  Count back from 20 0 points 0 points 0 points  Months in reverse 4 points 0 points 0 points  Repeat phrase 0 points 6 points 4 points  Total Score 4 points 6 points 4 points    Immunizations Immunization History  Administered Date(s) Administered   Fluad Quad(high Dose 65+) 03/02/2020, 05/25/2022   Influenza Split  04/09/2012   Influenza, High Dose Seasonal PF 02/22/2021   Influenza, Seasonal, Injecte, Preservative Fre 02/17/2011   Influenza,inj,Quad PF,6+ Mos 07/16/2013, 03/23/2014, 04/01/2015, 03/14/2016, 03/16/2017, 02/19/2018, 05/07/2019   Influenza-Unspecified 07/16/2013, 03/23/2014, 04/01/2015, 03/14/2016, 03/16/2017, 02/19/2018, 05/07/2019   Pneumococcal Conjugate-13 04/01/2015   Pneumococcal Polysaccharide-23 01/26/2010, 09/05/2019   Tdap 01/25/2012, 05/25/2022   Zoster, Live 01/08/2015    TDAP status: Up to date  Flu Vaccine status: Up to date  Pneumococcal vaccine status: Up to date  Covid-19 vaccine status: Declined, Education has been provided regarding the importance of this vaccine but patient still declined. Advised may receive this vaccine at local pharmacy or Health Dept.or vaccine clinic. Aware to provide a copy of the vaccination record if obtained from local pharmacy or Health Dept. Verbalized acceptance and understanding.  Qualifies for Shingles Vaccine? Yes   Zostavax completed No   Shingrix Completed?: No.    Education has been provided regarding the importance of this vaccine. Patient has been advised to call insurance company to determine out of pocket expense if they have not yet received this vaccine. Advised may also receive vaccine at local pharmacy or Health Dept. Verbalized acceptance and understanding.  Screening Tests Health Maintenance  Topic Date Due   COVID-19 Vaccine  (1) Never done   COLONOSCOPY (Pts 45-64yr Insurance coverage will need to be confirmed)  07/05/2020   MAMMOGRAM  09/16/2021   Medicare Annual Wellness (AWV)  07/19/2022   FOOT EXAM  06/29/2022   Zoster Vaccines- Shingrix (1 of 2) 08/23/2022 (Originally 08/06/1974)   Diabetic kidney evaluation - eGFR measurement  09/28/2022   Diabetic kidney evaluation - Urine ACR  09/28/2022   OPHTHALMOLOGY EXAM  11/02/2022   HEMOGLOBIN A1C  11/24/2022   PAP SMEAR-Modifier  05/26/2023   Lung Cancer Screening  06/13/2023   DTaP/Tdap/Td (3 - Td or Tdap) 05/25/2032   Pneumonia Vaccine 68 Years old  Completed   INFLUENZA VACCINE  Completed   DEXA SCAN  Completed   Hepatitis C Screening  Completed   HPV VACCINES  Aged Out    Health Maintenance  Health Maintenance Due  Topic Date Due   COVID-19 Vaccine (1) Never done   COLONOSCOPY (Pts 45-488yrInsurance coverage will need to be confirmed)  07/05/2020   MAMMOGRAM  09/16/2021   Medicare Annual Wellness (AWV)  07/19/2022   FOOT EXAM  06/29/2022    Colorectal cancer screening: Type of screening: Cologuard. Completed no. Repeat every 5 years  Mammogram status: Ordered yes. Pt provided with contact info and advised to call to schedule appt.   Bone Density status: Ordered yes. Pt provided with contact info and advised to call to schedule appt.  Lung Cancer Screening: (Low Dose CT Chest recommended if Age 68-80ears, 30 pack-year currently smoking OR have quit w/in 15years.) does not qualify.   Lung Cancer Screening Referral: no completed  Additional Screening:  Hepatitis C Screening: does not qualify; Completed yes  Vision Screening: Recommended annual ophthalmology exams for early detection of glaucoma and other disorders of the eye. Is the patient up to date with their annual eye exam?  Yes  Who is the provider or what is the name of the office in which the patient attends annual eye exams? [facility] pt is not established with a  provider, would they like to be referred to a provider to establish care? No .   Dental Screening: Recommended annual dental exams for proper oral hygiene  Community Resource Referral / Chronic Care Management:  CRR required this visit?  No   CCM required this visit?  No      Plan:     I have personally reviewed and noted the following in the patient's chart:   Medical and social history Use of alcohol, tobacco or illicit drugs  Current medications and supplements including opioid prescriptions. Patient is currently taking opioid prescriptions. Information provided to patient regarding non-opioid alternatives. Patient advised to discuss non-opioid treatment plan with their provider. Functional ability and status Nutritional status Physical activity Advanced directives List of other physicians Hospitalizations, surgeries, and ER visits in previous 12 months Vitals Screenings to include cognitive, depression, and falls Referrals and appointments  In addition, I have reviewed and discussed with patient certain preventive protocols, quality metrics, and best practice recommendations. A written personalized care plan for preventive services as well as general preventive health recommendations were provided to patient.     Roger Shelter, LPN   [id number]   Nurse Notes: pt managing chronic pain and trying to do more walking. No questions or concerns Orders/referrals placed for MMG and Dexa Scan. Pt also needs to complete the Cologuard (says was sent to wrong address previously and not done).

## 2022-07-24 ENCOUNTER — Other Ambulatory Visit: Payer: Self-pay | Admitting: Physical Medicine and Rehabilitation

## 2022-07-24 DIAGNOSIS — M48062 Spinal stenosis, lumbar region with neurogenic claudication: Secondary | ICD-10-CM | POA: Diagnosis not present

## 2022-07-24 DIAGNOSIS — M5416 Radiculopathy, lumbar region: Secondary | ICD-10-CM

## 2022-07-24 DIAGNOSIS — M4807 Spinal stenosis, lumbosacral region: Secondary | ICD-10-CM | POA: Diagnosis not present

## 2022-07-25 ENCOUNTER — Ambulatory Visit
Admission: RE | Admit: 2022-07-25 | Discharge: 2022-07-25 | Disposition: A | Discharge status: Home / Self Care | Payer: 59 | Source: Ambulatory Visit | Attending: Physical Medicine and Rehabilitation | Admitting: Physical Medicine and Rehabilitation

## 2022-07-25 DIAGNOSIS — M5416 Radiculopathy, lumbar region: Secondary | ICD-10-CM | POA: Diagnosis not present

## 2022-07-25 DIAGNOSIS — M4316 Spondylolisthesis, lumbar region: Secondary | ICD-10-CM | POA: Diagnosis not present

## 2022-07-26 ENCOUNTER — Other Ambulatory Visit: Payer: Self-pay | Admitting: Family Medicine

## 2022-07-26 DIAGNOSIS — G9589 Other specified diseases of spinal cord: Secondary | ICD-10-CM

## 2022-07-27 ENCOUNTER — Ambulatory Visit
Admission: RE | Admit: 2022-07-27 | Discharge: 2022-07-27 | Disposition: A | Discharge status: Home / Self Care | Payer: 59 | Source: Ambulatory Visit | Attending: Family Medicine | Admitting: Family Medicine

## 2022-07-27 DIAGNOSIS — M5124 Other intervertebral disc displacement, thoracic region: Secondary | ICD-10-CM | POA: Diagnosis not present

## 2022-07-27 DIAGNOSIS — M47814 Spondylosis without myelopathy or radiculopathy, thoracic region: Secondary | ICD-10-CM | POA: Diagnosis not present

## 2022-07-27 DIAGNOSIS — G9589 Other specified diseases of spinal cord: Secondary | ICD-10-CM | POA: Diagnosis not present

## 2022-07-27 DIAGNOSIS — M4804 Spinal stenosis, thoracic region: Secondary | ICD-10-CM | POA: Diagnosis not present

## 2022-07-31 DIAGNOSIS — M48062 Spinal stenosis, lumbar region with neurogenic claudication: Secondary | ICD-10-CM | POA: Diagnosis not present

## 2022-07-31 DIAGNOSIS — M5416 Radiculopathy, lumbar region: Secondary | ICD-10-CM | POA: Diagnosis not present

## 2022-08-03 ENCOUNTER — Ambulatory Visit (INDEPENDENT_AMBULATORY_CARE_PROVIDER_SITE_OTHER): Payer: 59 | Admitting: Neurosurgery

## 2022-08-03 VITALS — BP 100/60 | HR 92 | Ht 59.0 in | Wt 160.6 lb

## 2022-08-03 DIAGNOSIS — I10 Essential (primary) hypertension: Secondary | ICD-10-CM | POA: Diagnosis not present

## 2022-08-03 DIAGNOSIS — M5442 Lumbago with sciatica, left side: Secondary | ICD-10-CM

## 2022-08-03 DIAGNOSIS — G8929 Other chronic pain: Secondary | ICD-10-CM | POA: Diagnosis not present

## 2022-08-03 DIAGNOSIS — M4714 Other spondylosis with myelopathy, thoracic region: Secondary | ICD-10-CM | POA: Diagnosis not present

## 2022-08-03 DIAGNOSIS — M5441 Lumbago with sciatica, right side: Secondary | ICD-10-CM | POA: Diagnosis not present

## 2022-08-03 DIAGNOSIS — F17210 Nicotine dependence, cigarettes, uncomplicated: Secondary | ICD-10-CM

## 2022-08-03 DIAGNOSIS — M4316 Spondylolisthesis, lumbar region: Secondary | ICD-10-CM | POA: Diagnosis not present

## 2022-08-03 DIAGNOSIS — E1159 Type 2 diabetes mellitus with other circulatory complications: Secondary | ICD-10-CM

## 2022-08-03 NOTE — Progress Notes (Signed)
Referring Physician:  Harvest Dark, FNP [address] [place],  [place] [address]  Primary Physician:  Steele Sizer, MD  History of Present Illness: 08/03/2022 [name] returns to see me.  She is having some pain down her legs.  Her daughter reports that she is having some increased issues with her balance and sometimes has to use assistance in order not to fall.  She has not had any falls.  She denies weakness or bowel or bladder dysfunction.  03/02/2022 [name] is here today with stable findings.  She has no new concerning findings.  She does have some numbness in her right leg when she walks.    02/24/2021  [name] continues to have stable symptoms for her balance. She just tarted physical therapy for her leg. She is currently doing well.  08/05/2020 [name] returns to see me. She feels that her symptoms are overall relatively stable. She continues to have some numbness and discomfort in her right leg prickly when she walks. She has no falls. She has no worsening numbness.  She is now smoking 2 cigarettes/day.  01/29/2020 [name] returns to see me. Her symptoms are relatively stable. She denies any falls. She continues to have some pain down her right leg in an L5 distribution. Overall, she is not substantially worse than she was 2 months ago. Her last injection did help her.  11/27/19 She has had 2 injections since I last saw her. She had a right-sided S1 injection on November 11, 2019 and Oct 16, 2019.  She has had some mild issues with walking, with her right leg dragging at times. Overall, she feels relatively stable. Her back is feeling much better.  09/25/2019 [name] is here today with a chief complaint of right posterior leg pain, numbness in bottom of right foot. She feels that her right leg is weak when walking and it gives out on her causing her to fall at times. She does report issues with balance.  She has been having problems for  approximately 1 year. She reports sharp and aching pain. Her pain is as bad as 8 of the 10. She gets discomfort in the back of her legs and the bottom of her right foot. Walking aggravates her. She is able to walk 5 to 10 minutes before she has to stop due to pain. Nothing really helps.  She denies overt weakness or bowel or bladder dysfunction. She has not had any recent falls.   Review of Systems:  A 10 point review of systems is negative, except for the pertinent positives and negatives detailed in the HPI.  Past Medical History: Past Medical History:  Diagnosis Date   Allergy    ASCUS favor benign 09/2013   negative HPV   Atopic dermatitis    Dermatologist at [facility]   Chronic low back pain    COPD (chronic obstructive pulmonary disease) (HCC)    Decreased exercise tolerance    Dental caries    Diabetes mellitus without complication (HCC)    GERD (gastroesophageal reflux disease)    Hyperlipidemia    Hypertension    Lumbosacral neuritis    Microalbuminuria    Osteopenia    Ovarian failure    Tobacco use     Past Surgical History: Past Surgical History:  Procedure Laterality Date   CATARACT EXTRACTION Bilateral 1980   CESAREAN SECTION     CORNEAL TRANSPLANT Left    [facility] for blindness.  Allergies: Allergies as of 08/03/2022 - Review Complete 08/03/2022  Allergen Reaction Noted   Penicillins Rash 12/23/2014    Medications: Current Meds  Medication Sig   ACCU-CHEK AVIVA PLUS test strip CHECK BLOOD SUGAR ONCE DAILY   albuterol (VENTOLIN HFA) 108 (90 Base) MCG/ACT inhaler Inhale 2 puffs into the lungs every 6 (six) hours as needed for wheezing or shortness of breath.   Alcohol Swabs (ALCOHOL PREP) 70 % PADS CHECK BLOOD SUGAR 2 TIMES A DAY   AquaLance Lancets 30G MISC CHECK BLOOD SUGAR 2 TIMES A DAY   aspirin 81 MG chewable tablet Chew 1 tablet by mouth daily.   atorvastatin (LIPITOR) 40 MG tablet TAKE 1 TABLET BY MOUTH DAILY (IN PLACE OF SIMVASTATIN)    baclofen (LIORESAL) 10 MG tablet Take 1 tablet by mouth daily at 12 noon.   Cholecalciferol 50 MCG (2000 UT) CAPS Take by mouth.   clobetasol ointment (TEMOVATE) AB-123456789 % Apply 1 application  topically 2 (two) times daily.   Cyanocobalamin (B-12) 1000 MCG SUBL Place 1 tablet under the tongue daily.   dorzolamide-timolol (COSOPT) 22.3-6.8 MG/ML ophthalmic solution Place 1 drop into both eyes 2 (two) times daily.    empagliflozin (JARDIANCE) 25 MG TABS tablet Take 1 tablet (25 mg total) by mouth daily.   Fluticasone-Umeclidin-Vilant (TRELEGY ELLIPTA) 100-62.5-25 MCG/ACT AEPB Inhale 1 puff into the lungs daily.   folic acid (FOLVITE) 1 MG tablet Take 1 mg by mouth daily.    gabapentin (NEURONTIN) 300 MG capsule Take 1 capsule by mouth 2 (two) times daily.   HYDROcodone-acetaminophen (NORCO/VICODIN) 5-325 MG tablet Take 2 tablets by mouth 2 (two) times daily as needed for moderate pain.   Lancet Devices (SIMPLE DIAGNOSTICS LANCING DEV) MISC Check blood sugar once daily fasting, and up to 4 times daily PRN.   metFORMIN (GLUCOPHAGE-XR) 750 MG 24 hr tablet TAKE 2 TABLETS (1,500 MG TOTAL) BY MOUTHDAILY WITH BREAKFAST   methotrexate (RHEUMATREX) 2.5 MG tablet Take 7.5 mg by mouth every Friday.   Ropinirole HCl 6 MG TB24 Take 1 tablet by mouth at bedtime as needed.   triamcinolone ointment (KENALOG) 0.1 % Apply topically 2 (two) times daily.    Social History: Social History   Tobacco Use   Smoking status: Some Days    Packs/day: 1.00    Years: 46.00    Total pack years: 46.00    Types: Cigarettes    Start date: 01/07/1985    Last attempt to quit: 02/01/2020    Years since quitting: 2.5   Smokeless tobacco: Never   Tobacco comments:    1 pack per week-10/10/2021  Vaping Use   Vaping Use: Never used  Substance Use Topics   Alcohol use: No    Alcohol/week: 0.0 standard drinks of alcohol   Drug use: No    Family Medical History: Family History  Problem Relation Age of Onset   Diabetes Mother     Hypertension Mother    Pancreatic cancer Mother    Alcohol abuse Father    Arthritis/Rheumatoid Sister    Heart failure Sister    Diabetes Sister    Kidney disease Sister    Diabetes Brother    Breast cancer Neg Hx     Physical Examination: Vitals:   08/03/22 1149  BP: 100/60  Pulse: 92  SpO2: 95%    General: Patient is well developed, well nourished, calm, collected, and in no apparent distress. Attention to examination is appropriate.  Neck:   Supple.  Full range of  motion.  Respiratory: Patient is breathing without any difficulty.   NEUROLOGICAL:     Awake, alert, oriented to person, place, and time.  Speech is clear and fluent. Fund of knowledge is appropriate.   Cranial Nerves: Pupils equal round and reactive to light.  Facial tone is symmetric.  Facial sensation is symmetric. Shoulder shrug is symmetric. Tongue protrusion is midline.  There is no pronator drift.  ROM of spine: full.    Strength: Side Biceps Triceps Deltoid Interossei Grip Wrist Ext. Wrist Flex.  R '5 5 5 5 5 5 5  '$ L '5 5 5 5 5 5 5   '$ Side Iliopsoas Quads Hamstring PF DF EHL  R '5 5 5 5 5 5  '$ L '5 5 5 5 5 5   '$ Reflexes are 2+ and symmetric at the biceps, triceps, brachioradialis, patella and achilles.   Hoffman's is absent.   Bilateral upper and lower extremity sensation is intact to light touch.    No evidence of dysmetria noted.  Gait is slightly wide-based.     Medical Decision Making  Imaging: MRI L spine 08/03/19 IMPRESSION:  1. Moderate spinal stenosis L3-4 unchanged. Moderate subarticular  stenosis bilaterally.  2. Severe spinal stenosis with progression since the prior study.  Severe subarticular and foraminal stenosis on the left.  3. Moderate subarticular stenosis bilaterally L5-S1.  4. Progressive degenerative changes at T11-12 with significant  spinal stenosis and mild cord signal abnormality. Thoracic MRI with  dedicated axial images through this level may be helpful.    Electronically Signed    By: Franchot Gallo M.D.    On: 08/03/2019 06:38  MRI T spine 09/04/2019 IMPRESSION:  Diffuse degenerative disc narrowing and bulging with small  protrusions. Dominant findings at T11-12 where there is cord  impingement with flattening and mild T2 hyperintensity by prior  lumbar MRI.   Electronically Signed    By: Monte Fantasia M.D.    On: 09/04/2019 08:31  MRI T spine 07/27/2022 IMPRESSION: 1. At T11-T12, severe canal stenosis with cord deformity and mild STIR hyperintensity, compatible with cord edema and/or myelomalacia. Moderate to severe bilateral foraminal stenosis at this level. 2. At T9-T10 and T10-T11, moderate canal stenosis with moderate bilateral foraminal stenosis. 3. Additional multilevel mild canal and foraminal stenosis in the thoracic spine. 4. Left lung opacity correlates with findings better characterized on recent CT chest.     Electronically Signed   By: Margaretha Sheffield M.D.   On: 07/27/2022 12:06  MRI L spine 07/25/2022 IMPRESSION: 1. Probably severe canal stenosis at T11-T12 and at least moderate canal stenosis at T10-T11 with cord compression and cord edema and/or myelomalacia at T11-T12, only imaged sagittally. Findings are likely progressed relative to 2021 prior, but recommend dedicated MRI of the thoracic spine to fully characterize. 2. At L4-L5, severe left subarticular recess stenosis with moderate to severe central canal stenosis and moderate left foraminal stenosis. 3. At L5-S1, moderate to severe right and moderate left foraminal stenosis. Mild canal stenosis. 4. At L3-L4, moderate canal stenosis with mild-to-moderate left foraminal stenosis.     Electronically Signed   By: Margaretha Sheffield M.D.   On: 07/25/2022 12:29 I have personally reviewed the images and agree with the above interpretation.  Assessment and Plan: Julie Wall is a pleasant 67 y.o. female with symptoms concerning for development of  thoracic myelopathy.  She has known severe stenosis at T11-12.  She also has symptoms of neurogenic claudication and low back pain due to her anterolisthesis  at L4-5 causing severe stenosis.    She is currently not interested in any intervention.  We discussed warning signs for thoracic myelopathy.    I think it is time for her to consider thoracic decompression and fusion, but she is somewhat reticent to move forward.  She will talk with her family.  I will see her back in 4 weeks.  We did discuss that I would be unable to intervene on her lumbar spine until her thoracic spine has been decompressed.  I spent a total of 20 minutes in face-to-face and non-face-to-face activities related to this patient's care today.  Thank you for involving me in the care of this patient.      Julie Wall K. Izora Ribas MD, [facility] Neurosurgery

## 2022-08-07 DIAGNOSIS — L309 Dermatitis, unspecified: Secondary | ICD-10-CM | POA: Diagnosis not present

## 2022-08-07 DIAGNOSIS — L301 Dyshidrosis [pompholyx]: Secondary | ICD-10-CM | POA: Diagnosis not present

## 2022-08-07 DIAGNOSIS — L209 Atopic dermatitis, unspecified: Secondary | ICD-10-CM | POA: Diagnosis not present

## 2022-08-07 DIAGNOSIS — Z79899 Other long term (current) drug therapy: Secondary | ICD-10-CM | POA: Diagnosis not present

## 2022-08-07 DIAGNOSIS — L2084 Intrinsic (allergic) eczema: Secondary | ICD-10-CM | POA: Diagnosis not present

## 2022-08-07 LAB — HEPATIC FUNCTION PANEL
ALT: 14 U/L (ref 7–35)
AST: 16 (ref 13–35)

## 2022-08-07 LAB — CBC AND DIFFERENTIAL
HCT: 45 (ref 36–46)
Hemoglobin: 15.3 (ref 12.0–16.0)
Neutrophils Absolute: 5.6
Platelets: 236 10*3/uL (ref 150–400)
WBC: 8.8

## 2022-08-07 LAB — COMPREHENSIVE METABOLIC PANEL: eGFR: >90

## 2022-08-07 LAB — BASIC METABOLIC PANEL
BUN: 10 (ref 4–21)
Creatinine: 0.5 (ref 0.5–1.1)

## 2022-08-07 LAB — CBC: RBC: 4.75 (ref 3.87–5.11)

## 2022-08-10 ENCOUNTER — Encounter: Payer: Self-pay | Admitting: Pulmonary Disease

## 2022-08-10 ENCOUNTER — Ambulatory Visit (INDEPENDENT_AMBULATORY_CARE_PROVIDER_SITE_OTHER): Payer: 59 | Admitting: Pulmonary Disease

## 2022-08-10 VITALS — BP 118/78 | HR 96 | Temp 97.3°F | Ht 59.0 in | Wt 162.2 lb

## 2022-08-10 DIAGNOSIS — G4733 Obstructive sleep apnea (adult) (pediatric): Secondary | ICD-10-CM

## 2022-08-10 DIAGNOSIS — J4489 Other specified chronic obstructive pulmonary disease: Secondary | ICD-10-CM

## 2022-08-10 DIAGNOSIS — J449 Chronic obstructive pulmonary disease, unspecified: Secondary | ICD-10-CM | POA: Diagnosis not present

## 2022-08-10 DIAGNOSIS — J849 Interstitial pulmonary disease, unspecified: Secondary | ICD-10-CM | POA: Diagnosis not present

## 2022-08-10 DIAGNOSIS — F17210 Nicotine dependence, cigarettes, uncomplicated: Secondary | ICD-10-CM

## 2022-08-10 LAB — NITRIC OXIDE: Nitric Oxide: 49

## 2022-08-10 MED ORDER — TRELEGY ELLIPTA 200-62.5-25 MCG/ACT IN AEPB: 1.0000 | INHALATION_SPRAY | Freq: Every day | RESPIRATORY_TRACT | 0 refills | Status: DC

## 2022-08-10 MED ORDER — TRELEGY ELLIPTA 200-62.5-25 MCG/ACT IN AEPB: 1.0000 | INHALATION_SPRAY | Freq: Every day | RESPIRATORY_TRACT | 11 refills | Status: AC

## 2022-08-10 NOTE — Patient Instructions (Signed)
We are increasing the strength of your Trelegy to the 200 strength.  This will help with inflammation in your lung.  The test that we did on your breathing today showed that you have some inflammation in your lung this usually comes with asthma.  We will see you in follow-up in 3 months time call sooner should any new problems arise.  We may need to do blood work when you come back to see [place] but we will determine that at that time.

## 2022-08-10 NOTE — Progress Notes (Signed)
Subjective:    Patient ID: Julie Wall, female    DOB: 1955/12/18, 67 y.o.   MRN: [id number] Patient Care Team: Steele Sizer, MD as PCP - General (Family Medicine) Ernesto Rutherford, MD as Referring Physician (Dermatology) Vladimir Crofts, MD as Consulting Physician (Neurology) Germaine Pomfret, [facility] as Pharmacist (Pharmacist) Neldon Labella, RN as Case Manager  Chief Complaint  Patient presents with   Follow-up    Breathing is fine. No SOB, wheezing or cough.   HPI Julie Wall is a 67 year old current smoker with a 46-pack-year history of smoking and a history as noted below, who presents for follow-up on the issue of COPD, recently noted to have some vague interstitial changes on lung cancer screening CT.  The patient was last seen on 10 Oct 2021.  At that time she was instructed to get Trelegy 1 puff daily and also to continue her efforts to quit smoking.  She had lung cancer screening CT performed 12 June 2022 where no new significant pulmonary nodules were noted.  He had moderate patchy confluent basilar subpleural reticulation and groundglass opacity of both lungs that was ill characterized (interstitial lung abnormality, ILA).  She has now returned to yearly screening having had a few scans following on various nodules.  She does not endorse any worsening shortness of breath, wheezing or cough.  Her breathing is "fine".  She does use albuterol several times a week for shortness of breath.  She is on Trelegy Ellipta and notices that this helps her breathing.  Previously she was noted to have obstructive sleep apnea classified as moderate.  CPAP was ordered however the patient never went to get the equipment.  She has upcoming surgery due to spinal stenosis apparently this will be in April.  She is on methotrexate for severe atopic dermatitis.  Review of Systems A 10 point review of systems was performed and it is as noted above otherwise negative.  Patient Active Problem List    Diagnosis Date Noted   B12 deficiency 09/27/2021   Hypertension associated with diabetes ([place]) 09/27/2021   Diabetic polyneuropathy associated with type 2 diabetes mellitus ([place])    Hilar lymphadenopathy 02/05/2020   History of COVID-19 02/05/2020   Atherosclerosis of aorta ([place]) 05/04/2017   Pap smear abnormality of cervix with ASCUS favoring benign 04/23/2017   Vitamin D deficiency 12/14/2016   Obesity (BMI 30.0-34.9) 12/13/2015   Chronic bronchitis ([place]) 05/14/2015   Diabetes mellitus ([place]) 05/14/2015   Positive H. pylori test 04/05/2015   Rapid urease test for Helicobacter pylori infection positive 123456   Umbilical hernia [id number]   Exomphalos 04/01/2015   RLS (restless legs syndrome) 01/08/2015   Type 2 diabetes mellitus with hyperlipidemia ([place]) 01/08/2015   Neurogenic claudication 01/08/2015   AD (atopic dermatitis) 12/28/2014   Chronic LBP 12/28/2014   Dyslipidemia 12/28/2014   Gastroesophageal reflux disease without esophagitis 12/28/2014   Microalbuminuria 12/28/2014   Disorder of bone and cartilage 12/28/2014   Neuralgia of left thigh 12/28/2014   Perennial allergic rhinitis with seasonal variation 12/28/2014   Tobacco abuse 12/28/2014   Mononeuropathy of left lower extremity 12/28/2014   Hypertension, benign 09/22/2005   Social History   Tobacco Use   Smoking status: Every Day    Packs/day: 1.00    Years: 46.00    Total pack years: 46.00    Types: Cigarettes    Start date: 01/07/1985   Smokeless tobacco: Never   Tobacco comments:    3 cigarettes a day. 08/10/2022  Substance Use Topics   Alcohol use: No    Alcohol/week: 0.0 standard drinks of alcohol   Allergies  Allergen Reactions   Penicillins Rash   Current Meds  Medication Sig   ACCU-CHEK AVIVA PLUS test strip CHECK BLOOD SUGAR ONCE DAILY   albuterol (VENTOLIN HFA) 108 (90 Base) MCG/ACT inhaler Inhale 2 puffs into the lungs every 6 (six) hours as needed for wheezing or shortness of breath.    Alcohol Swabs (ALCOHOL PREP) 70 % PADS CHECK BLOOD SUGAR 2 TIMES A DAY   AquaLance Lancets 30G MISC CHECK BLOOD SUGAR 2 TIMES A DAY   aspirin 81 MG chewable tablet Chew 1 tablet by mouth daily.   atorvastatin (LIPITOR) 40 MG tablet TAKE 1 TABLET BY MOUTH DAILY (IN PLACE OF SIMVASTATIN)   baclofen (LIORESAL) 10 MG tablet Take 1 tablet by mouth daily at 12 noon.   Cholecalciferol 50 MCG (2000 UT) CAPS Take by mouth.   clobetasol ointment (TEMOVATE) AB-123456789 % Apply 1 application  topically 2 (two) times daily.   Cyanocobalamin (B-12) 1000 MCG SUBL Place 1 tablet under the tongue daily.   dorzolamide-timolol (COSOPT) 22.3-6.8 MG/ML ophthalmic solution Place 1 drop into both eyes 2 (two) times daily.    empagliflozin (JARDIANCE) 25 MG TABS tablet Take 1 tablet (25 mg total) by mouth daily.   Fluticasone-Umeclidin-Vilant (TRELEGY ELLIPTA) 100-62.5-25 MCG/ACT AEPB Inhale 1 puff into the lungs daily.   folic acid (FOLVITE) 1 MG tablet Take 1 mg by mouth daily.    gabapentin (NEURONTIN) 300 MG capsule Take 1 capsule by mouth 2 (two) times daily.   HYDROcodone-acetaminophen (NORCO/VICODIN) 5-325 MG tablet Take 2 tablets by mouth 2 (two) times daily as needed for moderate pain.   Lancet Devices (SIMPLE DIAGNOSTICS LANCING DEV) MISC Check blood sugar once daily fasting, and up to 4 times daily PRN.   metFORMIN (GLUCOPHAGE-XR) 750 MG 24 hr tablet TAKE 2 TABLETS (1,500 MG TOTAL) BY MOUTHDAILY WITH BREAKFAST   methotrexate (RHEUMATREX) 2.5 MG tablet Take 7.5 mg by mouth every Friday.   Ropinirole HCl 6 MG TB24 Take 1 tablet by mouth at bedtime as needed.   triamcinolone ointment (KENALOG) 0.1 % Apply topically 2 (two) times daily.   Immunization History  Administered Date(s) Administered   Fluad Quad(high Dose 65+) 03/02/2020, 05/25/2022   Influenza Split 04/09/2012   Influenza, High Dose Seasonal PF 02/22/2021   Influenza, Seasonal, Injecte, Preservative Fre 02/17/2011   Influenza,inj,Quad PF,6+ Mos  07/16/2013, 03/23/2014, 04/01/2015, 03/14/2016, 03/16/2017, 02/19/2018, 05/07/2019   Influenza-Unspecified 07/16/2013, 03/23/2014, 04/01/2015, 03/14/2016, 03/16/2017, 02/19/2018, 05/07/2019   Pneumococcal Conjugate-13 04/01/2015   Pneumococcal Polysaccharide-23 01/26/2010, 09/05/2019   Tdap 01/25/2012, 05/25/2022   Zoster, Live 01/08/2015       Objective:   Physical Exam BP 118/78 (BP Location: Left Arm, Cuff Size: Normal)   Pulse 96   Temp (!) 97.3 F (36.3 C)   Ht '4\' 11"'$  (1.499 m)   Wt 162 lb 3.2 oz (73.6 kg)   SpO2 96%   BMI 32.76 kg/m   SpO2: 96 % O2 Device: None (Room air)  GENERAL: Overweight woman in no acute distress.  Fully ambulatory.  No conversational dyspnea. HEAD: Normocephalic, atraumatic.  EYES: No scleral icterus.  Left corneal opacity (transplant). MOUTH: And requirement NECK: Supple. No thyromegaly. Trachea midline. No JVD.  No adenopathy. PULMONARY: Good air entry bilaterally.  Few rhonchi noted particularly on the upper lung zones. CARDIOVASCULAR: S1 and S2. Regular rate and rhythm.  No rubs, murmurs or gallops heard. ABDOMEN: Obese,  benign. MUSCULOSKELETAL: No joint deformity, mild clubbing of fingers, no edema.  NEUROLOGIC: No focal deficit, no gait disturbance.  Speech is fluent. SKIN: Intact,warm,dry.  On limited exam no rashes.  Has chronic pigmentation changes particularly in the upper extremities. PSYCH: Mood and behavior normal.  Lab Results  Component Value Date   NITRICOXIDE 49 08/10/2022  *Type II inflammation.     Assessment & Plan:     ICD-10-CM   1. Asthma-COPD overlap syndrome  J44.89 Nitric oxide   Evidence of increased type II inflammation Increase Trelegy to 200, 1 inhalation daily Continue as needed albuterol    2. ILD (interstitial lung disease) ([place])  J84.9    Ill characterized Seen on lung cancer screening CT Patient declines getting blood work done today Wants to limit scans for now    3. OSA (obstructive sleep  apnea)  G47.33    Undergoing reevaluation by neurology To have in lab split-night sleep study    4. Moderate cigarette smoker (10-19 per day)  F17.210    Patient counseled regards to discontinuation of smoking Total counseling time 3 to 5 minutes     Orders Placed This Encounter  Procedures   Nitric oxide   Meds ordered this encounter  Medications   Fluticasone-Umeclidin-Vilant (TRELEGY ELLIPTA) 200-62.5-25 MCG/ACT AEPB    Sig: Inhale 1 puff into the lungs daily.    Dispense:  28 each    Refill:  0    Order Specific Question:   Lot Number?    Answer:   [id number]    Order Specific Question:   Expiration Date?    Answer:   11/04/2023    Order Specific Question:   Quantity    Answer:   2   Fluticasone-Umeclidin-Vilant (TRELEGY ELLIPTA) 200-62.5-25 MCG/ACT AEPB    Sig: Inhale 1 puff into the lungs daily.    Dispense:  28 each    Refill:  11   The patient was made aware of the findings on lung cancer screening CT.  She would like to defer blood work and further imaging until after she has surgery on the thoracic spine which apparently will happen in April.  She has some issues with spinal stenosis that need to be addressed.  Will defer further workup and readdress at follow-up.  Will see the patient in 3 months time she is to contact us prior to that time should any new difficulties arise.  Renold Don, MD Advanced Bronchoscopy PCCM [facility]    *This note was dictated using voice recognition software/Dragon.  Despite best efforts to proofread, errors can occur which can change the meaning. Any transcriptional errors that result from this process are unintentional and may not be fully corrected at the time of dictation.

## 2022-08-15 DIAGNOSIS — H16201 Unspecified keratoconjunctivitis, right eye: Secondary | ICD-10-CM | POA: Diagnosis not present

## 2022-08-17 DIAGNOSIS — Z79899 Other long term (current) drug therapy: Secondary | ICD-10-CM | POA: Diagnosis not present

## 2022-08-17 DIAGNOSIS — L309 Dermatitis, unspecified: Secondary | ICD-10-CM | POA: Diagnosis not present

## 2022-08-17 DIAGNOSIS — L089 Local infection of the skin and subcutaneous tissue, unspecified: Secondary | ICD-10-CM | POA: Diagnosis not present

## 2022-08-28 DIAGNOSIS — M5416 Radiculopathy, lumbar region: Secondary | ICD-10-CM | POA: Diagnosis not present

## 2022-08-28 DIAGNOSIS — M4807 Spinal stenosis, lumbosacral region: Secondary | ICD-10-CM | POA: Diagnosis not present

## 2022-08-28 DIAGNOSIS — M48062 Spinal stenosis, lumbar region with neurogenic claudication: Secondary | ICD-10-CM | POA: Diagnosis not present

## 2022-08-31 ENCOUNTER — Ambulatory Visit (INDEPENDENT_AMBULATORY_CARE_PROVIDER_SITE_OTHER): Payer: 59 | Admitting: Neurosurgery

## 2022-08-31 ENCOUNTER — Encounter: Payer: Self-pay | Admitting: Neurosurgery

## 2022-08-31 VITALS — BP 140/80 | Ht 59.0 in | Wt 162.0 lb

## 2022-08-31 DIAGNOSIS — M4714 Other spondylosis with myelopathy, thoracic region: Secondary | ICD-10-CM | POA: Diagnosis not present

## 2022-08-31 DIAGNOSIS — M4804 Spinal stenosis, thoracic region: Secondary | ICD-10-CM | POA: Diagnosis not present

## 2022-08-31 NOTE — Progress Notes (Signed)
Referring Physician:  Steele Sizer, [address] [place],   [id number]  Primary Physician:  Steele Sizer, MD  History of Present Illness: 08/31/2022 She continues to have issues with her balance.  08/03/2022 Ms. Portal returns to see me.  She is having some pain down her legs.  Her daughter reports that she is having some increased issues with her balance and sometimes has to use assistance in order not to fall.  She has not had any falls.  She denies weakness or bowel or bladder dysfunction.  03/02/2022 Ms. Sagrario Markey is here today with stable findings.  She has no new concerning findings.  She does have some numbness in her right leg when she walks.    02/24/2021  Ms. Dangler continues to have stable symptoms for her balance. She just tarted physical therapy for her leg. She is currently doing well.  08/05/2020 Ms. Heidrich returns to see me. She feels that her symptoms are overall relatively stable. She continues to have some numbness and discomfort in her right leg prickly when she walks. She has no falls. She has no worsening numbness.  She is now smoking 2 cigarettes/day.  01/29/2020 Ms. Corona returns to see me. Her symptoms are relatively stable. She denies any falls. She continues to have some pain down her right leg in an L5 distribution. Overall, she is not substantially worse than she was 2 months ago. Her last injection did help her.  11/27/19 She has had 2 injections since I last saw her. She had a right-sided S1 injection on November 11, 2019 and Oct 16, 2019.  She has had some mild issues with walking, with her right leg dragging at times. Overall, she feels relatively stable. Her back is feeling much better.  09/25/2019 Ms. Kaprice Puchalski is here today with a chief complaint of right posterior leg pain, numbness in bottom of right foot. She feels that her right leg is weak when walking and it gives out on her causing her to fall at times. She does report  issues with balance.  She has been having problems for approximately 1 year. She reports sharp and aching pain. Her pain is as bad as 8 of the 10. She gets discomfort in the back of her legs and the bottom of her right foot. Walking aggravates her. She is able to walk 5 to 10 minutes before she has to stop due to pain. Nothing really helps.  She denies overt weakness or bowel or bladder dysfunction. She has not had any recent falls.   Review of Systems:  A 10 point review of systems is negative, except for the pertinent positives and negatives detailed in the HPI.  Past Medical History: Past Medical History:  Diagnosis Date   Allergy    ASCUS favor benign 09/2013   negative HPV   Atopic dermatitis    Dermatologist at [facility]   Chronic low back pain    COPD (chronic obstructive pulmonary disease) (HCC)    Decreased exercise tolerance    Dental caries    Diabetes mellitus without complication (HCC)    GERD (gastroesophageal reflux disease)    Hyperlipidemia    Hypertension    Lumbosacral neuritis    Microalbuminuria    Osteopenia    Ovarian failure    Tobacco use     Past Surgical History: Past Surgical History:  Procedure Laterality Date   CATARACT EXTRACTION Bilateral 1980   CESAREAN SECTION     CORNEAL  TRANSPLANT Left    [facility]-Treatment for blindness.    Allergies: Allergies as of 08/31/2022 - Review Complete 08/31/2022  Allergen Reaction Noted   Penicillins Rash 12/23/2014    Medications: Current Meds  Medication Sig   ACCU-CHEK AVIVA PLUS test strip CHECK BLOOD SUGAR ONCE DAILY   albuterol (VENTOLIN HFA) 108 (90 Base) MCG/ACT inhaler Inhale 2 puffs into the lungs every 6 (six) hours as needed for wheezing or shortness of breath.   Alcohol Swabs (ALCOHOL PREP) 70 % PADS CHECK BLOOD SUGAR 2 TIMES A DAY   AquaLance Lancets 30G MISC CHECK BLOOD SUGAR 2 TIMES A DAY   aspirin 81 MG chewable tablet Chew 1 tablet by mouth daily.   atorvastatin (LIPITOR) 40 MG tablet TAKE 1  TABLET BY MOUTH DAILY (IN PLACE OF SIMVASTATIN)   baclofen (LIORESAL) 10 MG tablet Take 1 tablet by mouth daily at 12 noon.   Cholecalciferol 50 MCG (2000 UT) CAPS Take by mouth.   clobetasol ointment (TEMOVATE) AB-123456789 % Apply 1 application  topically 2 (two) times daily.   Cyanocobalamin (B-12) 1000 MCG SUBL Place 1 tablet under the tongue daily.   dorzolamide-timolol (COSOPT) 22.3-6.8 MG/ML ophthalmic solution Place 1 drop into both eyes 2 (two) times daily.    empagliflozin (JARDIANCE) 25 MG TABS tablet Take 1 tablet (25 mg total) by mouth daily.   Fluticasone-Umeclidin-Vilant (TRELEGY ELLIPTA) 200-62.5-25 MCG/ACT AEPB Inhale 1 puff into the lungs daily.   Fluticasone-Umeclidin-Vilant (TRELEGY ELLIPTA) 200-62.5-25 MCG/ACT AEPB Inhale 1 puff into the lungs daily.   folic acid (FOLVITE) 1 MG tablet Take 1 mg by mouth daily.    gabapentin (NEURONTIN) 300 MG capsule Take 1 capsule by mouth 2 (two) times daily.   HYDROcodone-acetaminophen (NORCO/VICODIN) 5-325 MG tablet Take 2 tablets by mouth 2 (two) times daily as needed for moderate pain.   Lancet Devices (SIMPLE DIAGNOSTICS LANCING DEV) MISC Check blood sugar once daily fasting, and up to 4 times daily PRN.   metFORMIN (GLUCOPHAGE-XR) 750 MG 24 hr tablet TAKE 2 TABLETS (1,500 MG TOTAL) BY MOUTHDAILY WITH BREAKFAST   methotrexate (RHEUMATREX) 2.5 MG tablet Take 7.5 mg by mouth every Friday.   Ropinirole HCl 6 MG TB24 Take 1 tablet by mouth at bedtime as needed.   triamcinolone ointment (KENALOG) 0.1 % Apply topically 2 (two) times daily.    Social History: Social History   Tobacco Use   Smoking status: Every Day    Packs/day: 1.00    Years: 46.00    Additional pack years: 0.00    Total pack years: 46.00    Types: Cigarettes    Start date: 01/07/1985   Smokeless tobacco: Never   Tobacco comments:    3 cigarettes a day. 08/10/2022  Vaping Use   Vaping Use: Never used  Substance Use Topics   Alcohol use: No    Alcohol/week: 0.0 standard  drinks of alcohol   Drug use: No    Family Medical History: Family History  Problem Relation Age of Onset   Diabetes Mother    Hypertension Mother    Pancreatic cancer Mother    Alcohol abuse Father    Arthritis/Rheumatoid Sister    Heart failure Sister    Diabetes Sister    Kidney disease Sister    Diabetes Brother    Breast cancer Neg Hx     Physical Examination: Vitals:   08/31/22 1437  BP: (!) 140/80    General: Patient is well developed, well nourished, calm, collected, and in no apparent distress.  Attention to examination is appropriate.  Neck:   Supple.  Full range of motion.  Respiratory: Patient is breathing without any difficulty.   NEUROLOGICAL:     Awake, alert, oriented to person, place, and time.  Speech is clear and fluent. Fund of knowledge is appropriate.   Cranial Nerves: Pupils equal round and reactive to light.  Facial tone is symmetric.  Facial sensation is symmetric. Shoulder shrug is symmetric. Tongue protrusion is midline.  There is no pronator drift.  ROM of spine: full.    Strength: Side Biceps Triceps Deltoid Interossei Grip Wrist Ext. Wrist Flex.  R 5 5 5 5 5 5 5   L 5 5 5 5 5 5 5    Side Iliopsoas Quads Hamstring PF DF EHL  R 5 5 5 5 5 5   L 5 5 5 5 5 5    Reflexes are 2+ and symmetric at the biceps, triceps, brachioradialis, patella and achilles.   Hoffman's is absent.   Bilateral upper and lower extremity sensation is intact to light touch.    No evidence of dysmetria noted.  Gait is slightly wide-based.     Medical Decision Making  Imaging: MRI L spine 08/03/19 IMPRESSION:  1. Moderate spinal stenosis L3-4 unchanged. Moderate subarticular  stenosis bilaterally.  2. Severe spinal stenosis with progression since the prior study.  Severe subarticular and foraminal stenosis on the left.  3. Moderate subarticular stenosis bilaterally L5-S1.  4. Progressive degenerative changes at T11-12 with significant  spinal stenosis and  mild cord signal abnormality. Thoracic MRI with  dedicated axial images through this level may be helpful.   Electronically Signed    By: Franchot Gallo M.D.    On: 08/03/2019 06:38  MRI T spine 09/04/2019 IMPRESSION:  Diffuse degenerative disc narrowing and bulging with small  protrusions. Dominant findings at T11-12 where there is cord  impingement with flattening and mild T2 hyperintensity by prior  lumbar MRI.   Electronically Signed    By: Monte Fantasia M.D.    On: 09/04/2019 08:31  MRI T spine 07/27/2022 IMPRESSION: 1. At T11-T12, severe canal stenosis with cord deformity and mild STIR hyperintensity, compatible with cord edema and/or myelomalacia. Moderate to severe bilateral foraminal stenosis at this level. 2. At T9-T10 and T10-T11, moderate canal stenosis with moderate bilateral foraminal stenosis. 3. Additional multilevel mild canal and foraminal stenosis in the thoracic spine. 4. Left lung opacity correlates with findings better characterized on recent CT chest.     Electronically Signed   By: Margaretha Sheffield M.D.   On: 07/27/2022 12:06  MRI L spine 07/25/2022 IMPRESSION: 1. Probably severe canal stenosis at T11-T12 and at least moderate canal stenosis at T10-T11 with cord compression and cord edema and/or myelomalacia at T11-T12, only imaged sagittally. Findings are likely progressed relative to 2021 prior, but recommend dedicated MRI of the thoracic spine to fully characterize. 2. At L4-L5, severe left subarticular recess stenosis with moderate to severe central canal stenosis and moderate left foraminal stenosis. 3. At L5-S1, moderate to severe right and moderate left foraminal stenosis. Mild canal stenosis. 4. At L3-L4, moderate canal stenosis with mild-to-moderate left foraminal stenosis.     Electronically Signed   By: Margaretha Sheffield M.D.   On: 07/25/2022 12:29 I have personally reviewed the images and agree with the above  interpretation.  Assessment and Plan: Ms. Heming is a pleasant 67 y.o. female with symptoms concerning for development of thoracic myelopathy.  She has known severe stenosis at T11-12.  She  also has symptoms of neurogenic claudication and low back pain due to her anterolisthesis at L4-5 causing severe stenosis.    She has reconsidered and would like to think about thoracic decompression and fusion to address her thoracic myelopathy.  I think this is a reasonable consideration.  There is no role for conservative management for thoracic myelopathy given her severe stenosis and myelomalacia at T11-12.  I have recommended T11-12 posterior spinal fusion with decompression.  Fusion is necessary due to iatrogenic destabilization of T11-12 to achieve full decompression as the facet joints will need to be removed almost entirely.  I discussed the planned procedure at length with the patient, including the risks, benefits, alternatives, and indications. The risks discussed include but are not limited to bleeding, infection, need for reoperation, spinal fluid leak, stroke, vision loss, anesthetic complication, coma, paralysis, and even death. I also described in detail that improvement was not guaranteed.  The patient expressed understanding of these risks, and asked that we proceed with surgery. I described the surgery in layman's terms, and gave ample opportunity for questions, which were answered to the best of my ability.  I spent a total of 30 minutes in face-to-face and non-face-to-face activities related to this patient's care today.  Thank you for involving me in the care of this patient.      Shonda Mandarino K. Izora Ribas MD, [facility]

## 2022-08-31 NOTE — Patient Instructions (Signed)
Please see below for information in regards to your upcoming surgery:   Planned surgery: T11-12 posterior spinal fusion with decompression   Surgery date: 10/02/22 - you will find out your arrival time the business day before your surgery.   Pre-op appointment at [facility]: we will call you with a date/time for this. Pre-admit testing is located on the first floor of the [facility], [place], [address]. Please bring all prescriptions in the original prescription bottles to your appointment, even if you have reviewed medications by phone with a pharmacy representative. Pre-op labs may be done at your pre-op appointment. You are not required to fast for these labs. Should you need to change your pre-op appointment, please call Pre-admit testing at [phone].    Surgical clearance: we will send a clearance form to Dr Ancil Boozer    Blood thinners:  Aspirin:   stop aspirin 7 days prior, resume aspirin 14 days after   Diabetes medications: Metformin - hold for 2 days prior to surgery Jardiance - hold for 3 days prior to surgery    NSAIDS (Non-steroidal anti-inflammatory drugs): because you are having a fusion, no NSAIDS (such as ibuprofen, aleve, naproxen, meloxicam, diclofenac) for 3 months after surgery. Celebrex is an exception. Tylenol is ok because it is not an NSAID.    Home health physical therapy: [facility] [place] will contact you regarding home health physical therapy for after surgery.Their number is [phone].    Because you are having a fusion: for appointments after your 2 week follow-up: please arrive at the [facility] outpatient imaging center ([place], [place]) or [facility] one hour prior to your appointment for x-rays. This applies to every appointment after your 2 week follow-up. Failure to do so may result in your appointment being  rescheduled.   If you have FMLA/disability paperwork, please drop it off or fax it to [phone], attention [place].   We can be reached by phone or mychart 8am-4pm, Monday-Friday. If you have any questions/concerns before or after surgery, you can reach [place] at [phone], or you can send a mychart message. If you have a concern after hours that cannot wait until normal business hours, you can call [phone] and ask to page the neurosurgeon on call for [place].     Appointments/FMLA & disability paperwork: [place]  Nurse: Ophelia Shoulder  Medical assistants: Lum Keas Physician Assistant's: [place] Surgeon: Meade Maw, MD

## 2022-09-01 ENCOUNTER — Other Ambulatory Visit: Payer: Self-pay

## 2022-09-01 ENCOUNTER — Ambulatory Visit (INDEPENDENT_AMBULATORY_CARE_PROVIDER_SITE_OTHER): Payer: 59

## 2022-09-01 DIAGNOSIS — R809 Proteinuria, unspecified: Secondary | ICD-10-CM

## 2022-09-01 DIAGNOSIS — Z01818 Encounter for other preprocedural examination: Secondary | ICD-10-CM

## 2022-09-01 DIAGNOSIS — I152 Hypertension secondary to endocrine disorders: Secondary | ICD-10-CM

## 2022-09-01 NOTE — Chronic Care Management (AMB) (Unsigned)
  Chronic Care Management   CCM RN Visit Note  09/01/2022 Name: Julie Wall MRN: [id number] DOB: Nov 05, 1955  Subjective: Julie Wall is a 67 y.o. year old female who is a primary care patient of Alba Cory, MD. The patient was referred to the Chronic Care Management team for assistance with care management needs subsequent to provider initiation of CCM services and plan of care.    Today's Visit:  Engaged with patient by telephone for follow up visit.              PLAN:

## 2022-09-03 DIAGNOSIS — E1159 Type 2 diabetes mellitus with other circulatory complications: Secondary | ICD-10-CM | POA: Diagnosis not present

## 2022-09-03 DIAGNOSIS — Z7984 Long term (current) use of oral hypoglycemic drugs: Secondary | ICD-10-CM | POA: Diagnosis not present

## 2022-09-03 DIAGNOSIS — F17210 Nicotine dependence, cigarettes, uncomplicated: Secondary | ICD-10-CM

## 2022-09-03 DIAGNOSIS — I10 Essential (primary) hypertension: Secondary | ICD-10-CM

## 2022-09-13 NOTE — Progress Notes (Deleted)
Name: Julie Wall   MRN: [id number]    DOB: 1955/06/23   Date:09/13/2022       Progress Note  Subjective  Chief Complaint  Surgical Clearance  HPI  *** Patient Active Problem List   Diagnosis Date Noted   B12 deficiency 09/27/2021   Hypertension associated with diabetes 09/27/2021   Diabetic polyneuropathy associated with type 2 diabetes mellitus    Hilar lymphadenopathy 02/05/2020   History of COVID-19 02/05/2020   Atherosclerosis of aorta 05/04/2017   Pap smear abnormality of cervix with ASCUS favoring benign 04/23/2017   Vitamin D deficiency 12/14/2016   Obesity (BMI 30.0-34.9) 12/13/2015   Chronic bronchitis 05/14/2015   Diabetes mellitus 05/14/2015   Positive H. pylori test 04/05/2015   Rapid urease test for Helicobacter pylori infection positive 04/05/2015   Umbilical hernia 04/01/2015   Exomphalos 04/01/2015   RLS (restless legs syndrome) 01/08/2015   Type 2 diabetes mellitus with hyperlipidemia 01/08/2015   Neurogenic claudication 01/08/2015   AD (atopic dermatitis) 12/28/2014   Chronic LBP 12/28/2014   Dyslipidemia 12/28/2014   Gastroesophageal reflux disease without esophagitis 12/28/2014   Microalbuminuria 12/28/2014   Disorder of bone and cartilage 12/28/2014   Neuralgia of left thigh 12/28/2014   Perennial allergic rhinitis with seasonal variation 12/28/2014   Tobacco abuse 12/28/2014   Mononeuropathy of left lower extremity 12/28/2014   Hypertension, benign 09/22/2005    Past Surgical History:  Procedure Laterality Date   CATARACT EXTRACTION Bilateral 1980   CESAREAN SECTION     CORNEAL TRANSPLANT Left    [facility]-Treatment for blindness.    Family History  Problem Relation Age of Onset   Diabetes Mother    Hypertension Mother    Pancreatic cancer Mother    Alcohol abuse Father    Arthritis/Rheumatoid Sister    Heart failure Sister    Diabetes Sister    Kidney disease Sister    Diabetes Brother    Breast cancer Neg Hx     Social History    Tobacco Use   Smoking status: Every Day    Packs/day: 1.00    Years: 46.00    Additional pack years: 0.00    Total pack years: 46.00    Types: Cigarettes    Start date: 01/07/1985   Smokeless tobacco: Never   Tobacco comments:    3 cigarettes a day. 08/10/2022  Substance Use Topics   Alcohol use: No    Alcohol/week: 0.0 standard drinks of alcohol     Current Outpatient Medications:    ACCU-CHEK AVIVA PLUS test strip, CHECK BLOOD SUGAR ONCE DAILY, Disp: 100 each, Rfl: 2   albuterol (VENTOLIN HFA) 108 (90 Base) MCG/ACT inhaler, Inhale 2 puffs into the lungs every 6 (six) hours as needed for wheezing or shortness of breath., Disp: 18 g, Rfl: 0   Alcohol Swabs (ALCOHOL PREP) 70 % PADS, CHECK BLOOD SUGAR 2 TIMES A DAY, Disp: 200 each, Rfl: 3   AquaLance Lancets 30G MISC, CHECK BLOOD SUGAR 2 TIMES A DAY, Disp: 200 each, Rfl: 3   aspirin 81 MG chewable tablet, Chew 1 tablet by mouth daily., Disp: , Rfl:    atorvastatin (LIPITOR) 40 MG tablet, TAKE 1 TABLET BY MOUTH DAILY (IN PLACE OF SIMVASTATIN), Disp: 90 tablet, Rfl: 1   baclofen (LIORESAL) 10 MG tablet, Take 1 tablet by mouth daily at 12 noon., Disp: , Rfl:    Cholecalciferol 50 MCG (2000 UT) CAPS, Take by mouth., Disp: , Rfl:    clobetasol ointment (TEMOVATE) 0.05 %, Apply  1 application  topically 2 (two) times daily., Disp: , Rfl:    Cyanocobalamin (B-12) 1000 MCG SUBL, Place 1 tablet under the tongue daily., Disp: 100 tablet, Rfl: 1   dorzolamide-timolol (COSOPT) 22.3-6.8 MG/ML ophthalmic solution, Place 1 drop into both eyes 2 (two) times daily. , Disp: , Rfl:    empagliflozin (JARDIANCE) 25 MG TABS tablet, Take 1 tablet (25 mg total) by mouth daily., Disp: 90 tablet, Rfl: 1   Fluticasone-Umeclidin-Vilant (TRELEGY ELLIPTA) 200-62.5-25 MCG/ACT AEPB, Inhale 1 puff into the lungs daily., Disp: 28 each, Rfl: 0   Fluticasone-Umeclidin-Vilant (TRELEGY ELLIPTA) 200-62.5-25 MCG/ACT AEPB, Inhale 1 puff into the lungs daily., Disp: 28 each, Rfl:  11   folic acid (FOLVITE) 1 MG tablet, Take 1 mg by mouth daily. , Disp: , Rfl:    gabapentin (NEURONTIN) 300 MG capsule, Take 1 capsule by mouth 2 (two) times daily., Disp: , Rfl:    HYDROcodone-acetaminophen (NORCO/VICODIN) 5-325 MG tablet, Take 2 tablets by mouth 2 (two) times daily as needed for moderate pain., Disp: 30 tablet, Rfl: 0   Lancet Devices (SIMPLE DIAGNOSTICS LANCING DEV) MISC, Check blood sugar once daily fasting, and up to 4 times daily PRN., Disp: 100 each, Rfl: 2   metFORMIN (GLUCOPHAGE-XR) 750 MG 24 hr tablet, TAKE 2 TABLETS (1,500 MG TOTAL) BY MOUTHDAILY WITH BREAKFAST, Disp: 180 tablet, Rfl: 1   methotrexate (RHEUMATREX) 2.5 MG tablet, Take 7.5 mg by mouth every Friday., Disp: , Rfl:    Ropinirole HCl 6 MG TB24, Take 1 tablet by mouth at bedtime as needed., Disp: , Rfl:    triamcinolone ointment (KENALOG) 0.1 %, Apply topically 2 (two) times daily., Disp: 30 g, Rfl: 0  Allergies  Allergen Reactions   Penicillins Rash    I personally reviewed active problem list, medication list, allergies, family history, social history, health maintenance with the patient/caregiver today.   ROS  ***  Objective  There were no vitals filed for this visit.  There is no height or weight on file to calculate BMI.  Physical Exam ***  Recent Results (from the past 2160 hour(s))  Nitric oxide     Status: None   Collection Time: 08/10/22 10:18 AM  Result Value Ref Range   Nitric Oxide 49     PHQ2/9:    07/20/2022    9:21 AM 07/12/2022    4:22 PM 06/01/2022    9:15 AM 05/25/2022   10:24 AM 09/27/2021   10:05 AM  Depression screen PHQ 2/9  Decreased Interest 0 0 0 0 0  Down, Depressed, Hopeless 0 0 0 0 0  PHQ - 2 Score 0 0 0 0 0  Altered sleeping 0  0 0 0  Tired, decreased energy 0  0 0 0  Change in appetite 0  0 0 0  Feeling bad or failure about yourself  0  0 0 0  Trouble concentrating 0  0 0 0  Moving slowly or fidgety/restless 0  0 0 0  Suicidal thoughts 0  0 0 0   PHQ-9 Score 0  0 0 0  Difficult doing work/chores Not difficult at all  Not difficult at all      phq 9 is {gen pos [id number]}   Fall Risk:    07/20/2022    9:18 AM 07/12/2022    4:04 PM 06/01/2022    9:15 AM 05/25/2022   10:24 AM 09/27/2021   10:05 AM  Fall Risk   Falls in the past year? 0 0 0  0 0  Number falls in past yr: 0 0 0 0 0  Injury with Fall? 0 0 0 0 0  Risk for fall due to : No Fall Risks Medication side effect  No Fall Risks No Fall Risks  Follow up Education provided;Falls prevention discussed Falls prevention discussed  Falls prevention discussed Falls prevention discussed      Functional Status Survey:      Assessment & Plan  *** There are no diagnoses linked to this encounter.

## 2022-09-14 ENCOUNTER — Ambulatory Visit: Payer: 59 | Admitting: Family Medicine

## 2022-09-15 NOTE — Progress Notes (Unsigned)
Name: Julie Wall   MRN: [id number]    DOB: [birth year]   Date:09/18/2022       Progress Note  Subjective  Chief Complaint  Surgical Clearance  HPI  Diabetes: A1C went up from 6.8 % to 7.6 % , 8.4 %, down to 6.5 % , up to 7.2% and today is 7.6 % She is now taking 1500 mg of Metformin daily and Jardiance, however she has been drinking sweet tea again.   She also has associated obesity, dyslipidemia,  and microalbuminuria. We stopped Losartan because her bp was low. She denies  polyphagia or polydipsia but has polyuria. She is up to date with eye exam., last urine micro was negative She takes statin therapy for dyslipidemia    Eczema: she sees dermatologist at [facility], Dr. Amada Kingfisher  last visit was 08/2022   She is taking Methotrexate 7.5 mg weekly, and also folic acid , reviewed recent labs done at [facility]. She is having worse symptoms in her hands    Hyperlipidemia/Atherosclerosis of aorta : she is back  Atorvastatin and aspirin daily , last LDL was 40 . Continue medication , she does not want to get labs done today, she will get it done on her next visit    COPD/Intesticial lung disease on CT chest : she had  quit smoking 12/03/2016   but resumed smoking Fall 2019 S he quit again when she developed COVID-19 in 2021, but resumed smoking again since her sister died  Summer 2022 she has been smoking 4 cigarettes per day, advised to try to quit prior to back surgery. She is now on Trelegy daily now , she denies cough, sob or wheezing at this time.  She is under the care of Dr. Marcos Eke.    Chronic back pain with sciatica  She has seen Dr. Sherryll Burger, Dr. Council Mechanic and recently referred back to Dr. Marcell Barlow due to increase in symptoms, described as numbness and sharp pain that radiates from lateral foot to her back, affecting her sleep. Due to findings below , neurosurgeon recommended T11-12 posterior spinal fusion with decompression under general anesthesia. Surgery is scheduled for surgery 10/02/2022     IMPRESSION MRI thoracic spine: 07/27/2022   1. At T11-T12, severe canal stenosis with cord deformity and mild STIR hyperintensity, compatible with cord edema and/or myelomalacia. Moderate to severe bilateral foraminal stenosis at this level. 2. At T9-T10 and T10-T11, moderate canal stenosis with moderate bilateral foraminal stenosis. 3. Additional multilevel mild canal and foraminal stenosis in the thoracic spine. 4. Left lung opacity correlates with findings better characterized on recent CT chest.  Impression Lumbar spine MRI 07/27/2022  1. Probably severe canal stenosis at T11-T12 and at least moderate canal stenosis at T10-T11 with cord compression and cord edema and/or myelomalacia at T11-T12, only imaged sagittally. Findings are likely progressed relative to 2021 prior, but recommend dedicated MRI of the thoracic spine to fully characterize. 2. At L4-L5, severe left subarticular recess stenosis with moderate to severe central canal stenosis and moderate left foraminal stenosis. 3. At L5-S1, moderate to severe right and moderate left foraminal stenosis. Mild canal stenosis. 4. At L3-L4, moderate canal stenosis with mild-to-moderate left foraminal stenosis.   RLS: She sees neurologist, Dr. Sherryll Burger,  she was taking Requip but ran out of medication, still has magnesium at home, symptoms wakes her up during the night, advised her to call them back   Patient Active Problem List   Diagnosis Date Noted   B12 deficiency 09/27/2021   Hypertension associated with  diabetes 09/27/2021   Diabetic polyneuropathy associated with type 2 diabetes mellitus    Hilar lymphadenopathy 02/05/2020   History of COVID-19 02/05/2020   Atherosclerosis of aorta 05/04/2017   Pap smear abnormality of cervix with ASCUS favoring benign 04/23/2017   Vitamin D deficiency 12/14/2016   Obesity (BMI 30.0-34.9) 12/13/2015   Chronic bronchitis 05/14/2015   Diabetes mellitus 05/14/2015   Positive H. pylori test  04/05/2015   Rapid urease test for Helicobacter pylori infection positive 04/05/2015   Umbilical hernia 04/01/2015   Exomphalos 04/01/2015   RLS (restless legs syndrome) 01/08/2015   Type 2 diabetes mellitus with hyperlipidemia 01/08/2015   Neurogenic claudication 01/08/2015   AD (atopic dermatitis) 12/28/2014   Chronic LBP 12/28/2014   Dyslipidemia 12/28/2014   Gastroesophageal reflux disease without esophagitis 12/28/2014   Microalbuminuria 12/28/2014   Disorder of bone and cartilage 12/28/2014   Neuralgia of left thigh 12/28/2014   Perennial allergic rhinitis with seasonal variation 12/28/2014   Tobacco abuse 12/28/2014   Mononeuropathy of left lower extremity 12/28/2014   Hypertension, benign 09/22/2005    Past Surgical History:  Procedure Laterality Date   CATARACT EXTRACTION Bilateral 1980   CESAREAN SECTION     CORNEAL TRANSPLANT Left    [facility]-Treatment for blindness.    Family History  Problem Relation Age of Onset   Diabetes Mother    Hypertension Mother    Pancreatic cancer Mother    Alcohol abuse Father    Arthritis/Rheumatoid Sister    Heart failure Sister    Diabetes Sister    Kidney disease Sister    Diabetes Brother    Breast cancer Neg Hx     Social History   Tobacco Use   Smoking status: Every Day    Packs/day: 1.00    Years: 46.00    Additional pack years: 0.00    Total pack years: 46.00    Types: Cigarettes    Start date: 01/07/1985   Smokeless tobacco: Never   Tobacco comments:    3 cigarettes a day. 08/10/2022  Substance Use Topics   Alcohol use: No    Alcohol/week: 0.0 standard drinks of alcohol     Current Outpatient Medications:    ACCU-CHEK AVIVA PLUS test strip, CHECK BLOOD SUGAR ONCE DAILY, Disp: 100 each, Rfl: 2   albuterol (VENTOLIN HFA) 108 (90 Base) MCG/ACT inhaler, Inhale 2 puffs into the lungs every 6 (six) hours as needed for wheezing or shortness of breath., Disp: 18 g, Rfl: 0   Alcohol Swabs (ALCOHOL PREP) 70 % PADS, CHECK  BLOOD SUGAR 2 TIMES A DAY, Disp: 200 each, Rfl: 3   AquaLance Lancets 30G MISC, CHECK BLOOD SUGAR 2 TIMES A DAY, Disp: 200 each, Rfl: 3   aspirin 81 MG chewable tablet, Chew 1 tablet by mouth daily., Disp: , Rfl:    atorvastatin (LIPITOR) 40 MG tablet, TAKE 1 TABLET BY MOUTH DAILY (IN PLACE OF SIMVASTATIN), Disp: 90 tablet, Rfl: 1   baclofen (LIORESAL) 10 MG tablet, Take 1 tablet by mouth daily at 12 noon., Disp: , Rfl:    Cholecalciferol 50 MCG (2000 UT) CAPS, Take by mouth., Disp: , Rfl:    clobetasol ointment (TEMOVATE) 0.05 %, Apply 1 application  topically 2 (two) times daily., Disp: , Rfl:    Cyanocobalamin (B-12) 1000 MCG SUBL, Place 1 tablet under the tongue daily., Disp: 100 tablet, Rfl: 1   dorzolamide-timolol (COSOPT) 22.3-6.8 MG/ML ophthalmic solution, Place 1 drop into both eyes 2 (two) times daily. , Disp: , Rfl:  empagliflozin (JARDIANCE) 25 MG TABS tablet, Take 1 tablet (25 mg total) by mouth daily., Disp: 90 tablet, Rfl: 1   Fluticasone-Umeclidin-Vilant (TRELEGY ELLIPTA) 200-62.5-25 MCG/ACT AEPB, Inhale 1 puff into the lungs daily., Disp: 28 each, Rfl: 0   Fluticasone-Umeclidin-Vilant (TRELEGY ELLIPTA) 200-62.5-25 MCG/ACT AEPB, Inhale 1 puff into the lungs daily., Disp: 28 each, Rfl: 11   folic acid (FOLVITE) 1 MG tablet, Take 1 mg by mouth daily. , Disp: , Rfl:    gabapentin (NEURONTIN) 300 MG capsule, Take 1 capsule by mouth 2 (two) times daily., Disp: , Rfl:    HYDROcodone-acetaminophen (NORCO/VICODIN) 5-325 MG tablet, Take 2 tablets by mouth 2 (two) times daily as needed for moderate pain., Disp: 30 tablet, Rfl: 0   Lancet Devices (SIMPLE DIAGNOSTICS LANCING DEV) MISC, Check blood sugar once daily fasting, and up to 4 times daily PRN., Disp: 100 each, Rfl: 2   metFORMIN (GLUCOPHAGE-XR) 750 MG 24 hr tablet, TAKE 2 TABLETS (1,500 MG TOTAL) BY MOUTHDAILY WITH BREAKFAST, Disp: 180 tablet, Rfl: 1   methotrexate (RHEUMATREX) 2.5 MG tablet, Take 7.5 mg by mouth every Friday., Disp: ,  Rfl:    Ropinirole HCl 6 MG TB24, Take 1 tablet by mouth at bedtime as needed., Disp: , Rfl:    triamcinolone ointment (KENALOG) 0.1 %, Apply topically 2 (two) times daily., Disp: 30 g, Rfl: 0  Allergies  Allergen Reactions   Penicillins Rash    I personally reviewed active problem list, medication list, allergies, family history, social history, health maintenance with the patient/caregiver today.   ROS  Constitutional: Negative for fever or weight change.  Respiratory: Negative for cough and shortness of breath.   Cardiovascular: Negative for chest pain or palpitations.  Gastrointestinal: Negative for abdominal pain, no bowel changes.  Musculoskeletal: Negative for gait problem or joint swelling.  Skin: Negative for rash.  Neurological: Negative for dizziness or headache.  No other specific complaints in a complete review of systems (except as listed in HPI above).   Objective  Vitals:   09/18/22 1524  BP: 128/68  Pulse: 91  Resp: 16  SpO2: 96%  Weight: 163 lb (73.9 kg)  Height: 4\' 11"  (1.499 m)    Body mass index is 32.92 kg/m.  Physical Exam  Constitutional: Patient appears well-developed and well-nourished. Obese  No distress.  HEENT: head atraumatic, normocephalic, pupils equal and reactive to light,, neck supple, Cardiovascular: Normal rate, regular rhythm and normal heart sounds.  No murmur heard. No BLE edema. Pulmonary/Chest: Effort normal and breath sounds normal. No respiratory distress. Abdominal: Soft.  There is no tenderness. Skin: eczema on both hands with some cracks on skin Psychiatric: Patient has a normal mood and affect. behavior is normal. Judgment and thought content normal.   Recent Results (from the past 2160 hour(s))  CBC and differential     Status: None   Collection Time: 08/07/22 12:00 AM  Result Value Ref Range   Hemoglobin 15.3 12.0 - 16.0   HCT 45 36 - 46   Neutrophils Absolute 5.60    Platelets 236 150 - 400 K/uL   WBC 8.8    CBC     Status: None   Collection Time: 08/07/22 12:00 AM  Result Value Ref Range   RBC 4.75 3.87 - 5.11  Basic metabolic panel     Status: None   Collection Time: 08/07/22 12:00 AM  Result Value Ref Range   BUN 10 4 - 21   Creatinine 0.5 0.5 - 1.1  Comprehensive metabolic panel  Status: None   Collection Time: 08/07/22 12:00 AM  Result Value Ref Range   eGFR >90   Hepatic function panel     Status: None   Collection Time: 08/07/22 12:00 AM  Result Value Ref Range   ALT 14 7 - 35 U/L   AST 16 13 - 35  Nitric oxide     Status: None   Collection Time: 08/10/22 10:18 AM  Result Value Ref Range   Nitric Oxide 49     PHQ2/9:    09/18/2022    3:23 PM 07/20/2022    9:21 AM 07/12/2022    4:22 PM 06/01/2022    9:15 AM 05/25/2022   10:24 AM  Depression screen PHQ 2/9  Decreased Interest 0 0 0 0 0  Down, Depressed, Hopeless 0 0 0 0 0  PHQ - 2 Score 0 0 0 0 0  Altered sleeping 0 0  0 0  Tired, decreased energy 0 0  0 0  Change in appetite 0 0  0 0  Feeling bad or failure about yourself  0 0  0 0  Trouble concentrating 0 0  0 0  Moving slowly or fidgety/restless 0 0  0 0  Suicidal thoughts 0 0  0 0  PHQ-9 Score 0 0  0 0  Difficult doing work/chores  Not difficult at all  Not difficult at all     phq 9 is negative   Fall Risk:    09/18/2022    3:23 PM 07/20/2022    9:18 AM 07/12/2022    4:04 PM 06/01/2022    9:15 AM 05/25/2022   10:24 AM  Fall Risk   Falls in the past year? 0 0 0 0 0  Number falls in past yr: 0 0 0 0 0  Injury with Fall? 0 0 0 0 0  Risk for fall due to : No Fall Risks No Fall Risks Medication side effect  No Fall Risks  Follow up Falls prevention discussed Education provided;Falls prevention discussed Falls prevention discussed  Falls prevention discussed      Functional Status Survey: Is the patient deaf or have difficulty hearing?: No Does the patient have difficulty seeing, even when wearing glasses/contacts?: No Does the patient have  difficulty concentrating, remembering, or making decisions?: No Does the patient have difficulty walking or climbing stairs?: Yes Does the patient have difficulty dressing or bathing?: No Does the patient have difficulty doing errands alone such as visiting a doctor's office or shopping?: No    Assessment & Plan  1. Type 2 diabetes mellitus with hyperlipidemia  - POCT HgB A1C  2. Hypertension associated with diabetes  Bp is at goal   3. Vitamin D deficiency  Continue supplementation   4. Atherosclerosis of aorta  On statin therapy  5. Interstitial lung disease  Keep follow up with Dr. Marcos Eke and Trelegy   6. Neurogenic claudication  Hopefully it will improve with surgery   7. B12 deficiency   8. Other eczema  Under the care of dermatologist at [facility]  9. RLS (restless legs syndrome)  Out of medication, needs to follow up with Dr. Sherryll Burger  10. Thoracic spinal stenosis  Forms filled out for pre op clearance    May proceed without further testing, able to walk on a fast pace and go up a flight of stairs, glucose is okay, may hold medications as recommended by surgeon, no previous reactions to anesthesia

## 2022-09-18 ENCOUNTER — Ambulatory Visit (INDEPENDENT_AMBULATORY_CARE_PROVIDER_SITE_OTHER): Payer: 59 | Admitting: Family Medicine

## 2022-09-18 ENCOUNTER — Encounter: Payer: Self-pay | Admitting: Family Medicine

## 2022-09-18 VITALS — BP 128/68 | HR 91 | Resp 16 | Ht 59.0 in | Wt 163.0 lb

## 2022-09-18 DIAGNOSIS — E1129 Type 2 diabetes mellitus with other diabetic kidney complication: Secondary | ICD-10-CM

## 2022-09-18 DIAGNOSIS — G2581 Restless legs syndrome: Secondary | ICD-10-CM

## 2022-09-18 DIAGNOSIS — E1159 Type 2 diabetes mellitus with other circulatory complications: Secondary | ICD-10-CM

## 2022-09-18 DIAGNOSIS — I152 Hypertension secondary to endocrine disorders: Secondary | ICD-10-CM

## 2022-09-18 DIAGNOSIS — R29818 Other symptoms and signs involving the nervous system: Secondary | ICD-10-CM | POA: Diagnosis not present

## 2022-09-18 DIAGNOSIS — E559 Vitamin D deficiency, unspecified: Secondary | ICD-10-CM

## 2022-09-18 DIAGNOSIS — J849 Interstitial pulmonary disease, unspecified: Secondary | ICD-10-CM

## 2022-09-18 DIAGNOSIS — M4804 Spinal stenosis, thoracic region: Secondary | ICD-10-CM | POA: Diagnosis not present

## 2022-09-18 DIAGNOSIS — I700 Atherosclerosis of aorta: Secondary | ICD-10-CM

## 2022-09-18 DIAGNOSIS — E1142 Type 2 diabetes mellitus with diabetic polyneuropathy: Secondary | ICD-10-CM

## 2022-09-18 DIAGNOSIS — E1169 Type 2 diabetes mellitus with other specified complication: Secondary | ICD-10-CM

## 2022-09-18 DIAGNOSIS — L308 Other specified dermatitis: Secondary | ICD-10-CM

## 2022-09-18 DIAGNOSIS — E538 Deficiency of other specified B group vitamins: Secondary | ICD-10-CM | POA: Diagnosis not present

## 2022-09-18 DIAGNOSIS — E785 Hyperlipidemia, unspecified: Secondary | ICD-10-CM

## 2022-09-18 DIAGNOSIS — R809 Proteinuria, unspecified: Secondary | ICD-10-CM

## 2022-09-18 LAB — POCT GLYCOSYLATED HEMOGLOBIN (HGB A1C): Hemoglobin A1C: 7.6 % — AB (ref 4.0–5.6)

## 2022-09-18 MED ORDER — ATORVASTATIN CALCIUM 40 MG PO TABS: 40.0000 mg | ORAL_TABLET | Freq: Every day | ORAL | 1 refills | Status: DC

## 2022-09-19 ENCOUNTER — Encounter
Admission: RE | Admit: 2022-09-19 | Discharge: 2022-09-19 | Disposition: A | Discharge status: Home / Self Care | Payer: 59 | Source: Ambulatory Visit | Attending: Neurosurgery | Admitting: Neurosurgery

## 2022-09-19 ENCOUNTER — Other Ambulatory Visit: Payer: Self-pay

## 2022-09-19 VITALS — BP 132/77 | HR 98 | Wt 161.6 lb

## 2022-09-19 DIAGNOSIS — Z01818 Encounter for other preprocedural examination: Secondary | ICD-10-CM | POA: Diagnosis present

## 2022-09-19 DIAGNOSIS — I152 Hypertension secondary to endocrine disorders: Secondary | ICD-10-CM | POA: Diagnosis not present

## 2022-09-19 DIAGNOSIS — E1159 Type 2 diabetes mellitus with other circulatory complications: Secondary | ICD-10-CM | POA: Insufficient documentation

## 2022-09-19 DIAGNOSIS — Z01812 Encounter for preprocedural laboratory examination: Secondary | ICD-10-CM

## 2022-09-19 DIAGNOSIS — I10 Essential (primary) hypertension: Secondary | ICD-10-CM | POA: Diagnosis not present

## 2022-09-19 HISTORY — DX: Sleep apnea, unspecified: G47.30

## 2022-09-19 HISTORY — DX: Legal blindness, as defined in USA: H54.8

## 2022-09-19 HISTORY — DX: Unspecified osteoarthritis, unspecified site: M19.90

## 2022-09-19 HISTORY — DX: Dyspnea, unspecified: R06.00

## 2022-09-19 LAB — TYPE AND SCREEN
ABO/RH(D): B POS
Antibody Screen: NEGATIVE

## 2022-09-19 LAB — URINALYSIS, ROUTINE W REFLEX MICROSCOPIC
Bacteria, UA: NONE SEEN
Bilirubin Urine: NEGATIVE
Glucose, UA: >=500 mg/dL — AB
Hgb urine dipstick: NEGATIVE
Ketones, ur: NEGATIVE mg/dL
Nitrite: NEGATIVE
Protein, ur: NEGATIVE mg/dL
Specific Gravity, Urine: 1.031 — ABNORMAL HIGH (ref 1.005–1.030)
pH: 5 (ref 5.0–8.0)

## 2022-09-19 LAB — COMPREHENSIVE METABOLIC PANEL
ALT: 25 U/L (ref 0–44)
AST: 18 U/L (ref 15–41)
Albumin: 3.6 g/dL (ref 3.5–5.0)
Alkaline Phosphatase: 87 U/L (ref 38–126)
Anion gap: 8 (ref 5–15)
BUN: 10 mg/dL (ref 8–23)
CO2: 25 mmol/L (ref 22–32)
Calcium: 9.2 mg/dL (ref 8.9–10.3)
Chloride: 108 mmol/L (ref 98–111)
Creatinine, Ser: 0.5 mg/dL (ref 0.44–1.00)
GFR, Estimated: >60 mL/min (ref >60)
Glucose, Bld: 167 mg/dL — ABNORMAL HIGH (ref 70–99)
Potassium: 3.6 mmol/L (ref 3.5–5.1)
Sodium: 141 mmol/L (ref 135–145)
Total Bilirubin: 0.4 mg/dL (ref 0.3–1.2)
Total Protein: 7.1 g/dL (ref 6.5–8.1)

## 2022-09-19 LAB — SURGICAL PCR SCREEN
MRSA, PCR: NEGATIVE
Staphylococcus aureus: NEGATIVE

## 2022-09-19 NOTE — Patient Instructions (Addendum)
Your procedure is scheduled on: 10/02/22 - Monday Report to the Registration Desk on the 1st floor of the [facility]. To find out your arrival time, please call [phone] between 1PM - 3PM on: 09/29/22 - Friday If your arrival time is 6:00 am, do not arrive before that time as the [facility] entrance doors do not open until 6:00 am.  REMEMBER: Instructions that are not followed completely may result in serious medical risk, up to and including death; or upon the discretion of your surgeon and anesthesiologist your surgery may need to be rescheduled.  Do not eat food after midnight the night before surgery.  No gum chewing or hard candies.  You may however, drink CLEAR liquids up to 2 hours before you are scheduled to arrive for your surgery. Do not drink anything within 2 hours of your scheduled arrival time.  Clear liquids include: - water  - apple juice without pulp - gatorade (not RED colors) - black coffee or tea (Do NOT add milk or creamers to the coffee or tea) Do NOT drink anything that is not on this list.  One week prior to surgery: Stop Anti-inflammatories (NSAIDS) such as Advil, Aleve, Ibuprofen, Motrin, Naproxen, Naprosyn and Aspirin based products such as Excedrin, Goody's Powder, BC Powder.  Stop ANY OVER THE COUNTER supplements until after surgery.   You may however, continue to take Tylenol if needed for pain up until the day of surgery.  Continue taking all prescribed medications with the exception of the following:  Aspirin: stop taking on 09/25/22 aspirin 7 days prior, resume aspirin 14 days on 10/16/22. Metformin - hold for 2 days prior to surgery beginning 09/30/22 Jardiance - hold for 3 days prior to surgery, stop taking 09/29/22.   TAKE ONLY THESE MEDICATIONS THE MORNING OF SURGERY WITH A SIP OF WATER:  atorvastatin (LIPITOR)  TRELEGY ELLIPTA  gabapentin (NEURONTIN)   Use albuterol (VENTOLIN HFA)  on the day of surgery and bring to the  hospital.  No Alcohol for 24 hours before or after surgery.  No Smoking including e-cigarettes for 24 hours before surgery.  No chewable tobacco products for at least 6 hours before surgery.  No nicotine patches on the day of surgery.  Do not use any "recreational" drugs for at least a week (preferably 2 weeks) before your surgery.  Please be advised that the combination of cocaine and anesthesia may have negative outcomes, up to and including death. If you test positive for cocaine, your surgery will be cancelled.  On the morning of surgery brush your teeth with toothpaste and water, you may rinse your mouth with mouthwash if you wish. Do not swallow any toothpaste or mouthwash.  Use CHG Soap or wipes as directed on instruction sheet.  Do not wear jewelry, make-up, hairpins, clips or nail polish.  Do not wear lotions, powders, or perfumes.   Do not shave body hair from the neck down 48 hours before surgery.  Contact lenses, hearing aids and dentures may not be worn into surgery.  Do not bring valuables to the hospital. [facility] is not responsible for any missing/lost belongings or valuables.   Notify your doctor if there is any change in your medical condition (cold, fever, infection).  Wear comfortable clothing (specific to your surgery type) to the hospital.  After surgery, you can help prevent lung complications by doing breathing exercises.  Take deep breaths and cough every 1-2 hours. Your doctor may order a device called an Facilities manager  to help you take deep breaths. When coughing or sneezing, hold a pillow firmly against your incision with both hands. This is called "splinting." Doing this helps protect your incision. It also decreases belly discomfort.  If you are being admitted to the hospital overnight, leave your suitcase in the car. After surgery it may be brought to your room.  In case of increased patient census, it may be necessary for you, the patient,  to continue your postoperative care in the Same Day Surgery department.  If you are being discharged the day of surgery, you will not be allowed to drive home. You will need a responsible individual to drive you home and stay with you for 24 hours after surgery.   If you are taking public transportation, you will need to have a responsible individual with you.  Please call the Pre-admissions Testing Dept. at [phone] if you have any questions about these instructions.  Surgery Visitation Policy:  Patients having surgery or a procedure may have two visitors.  Children under the age of 63 must have an adult with them who is not the patient.  Inpatient Visitation:    Visiting hours are 7 a.m. to 8 p.m. Up to four visitors are allowed at one time in a patient room. The visitors may rotate out with other people during the day.  One visitor age 5 or older may stay with the patient overnight and must be in the room by 8 p.m.

## 2022-09-28 ENCOUNTER — Telehealth: Payer: Self-pay

## 2022-09-28 NOTE — Telephone Encounter (Signed)
   CCM RN Visit Note    Name: Julie Wall MRN: [id number]      DOB: 1955/11/12  Subjective: Julie Wall is a 67 y.o. year old female who is a primary care patient of Alba Cory, MD. The patient was referred to the Chronic Care Management team for assistance with care management needs subsequent to provider initiation of CCM services and plan of care.      An unsuccessful telephone outreach was attempted today to contact the patient about Chronic Care Management needs.    PLAN: A member of the care management team will make additional outreach attempts.    Katha Cabal RN Care Manager/Chronic Care Management [phone]

## 2022-10-01 MED ORDER — CEFAZOLIN IN SODIUM CHLORIDE 2-0.9 GM/100ML-% IV SOLN
2.0000 g | Freq: Once | INTRAVENOUS | Status: DC
  Filled 2022-10-01: qty 100

## 2022-10-01 MED ORDER — FAMOTIDINE 20 MG PO TABS
20.0000 mg | ORAL_TABLET | Freq: Once | ORAL | Status: AC
  Administered 2022-10-02: 20 mg via ORAL

## 2022-10-01 MED ORDER — VANCOMYCIN HCL IN DEXTROSE 1-5 GM/200ML-% IV SOLN
1000.0000 mg | Freq: Once | INTRAVENOUS | Status: AC
  Administered 2022-10-02: 1000 mg via INTRAVENOUS

## 2022-10-01 MED ORDER — CHLORHEXIDINE GLUCONATE 0.12 % MT SOLN
15.0000 mL | Freq: Once | OROMUCOSAL | Status: AC
  Administered 2022-10-02: 15 mL via OROMUCOSAL

## 2022-10-01 MED ORDER — LACTATED RINGERS IV SOLN: INTRAVENOUS | Status: DC

## 2022-10-01 MED ORDER — ORAL CARE MOUTH RINSE: 15.0000 mL | Freq: Once | OROMUCOSAL | Status: AC

## 2022-10-01 MED ORDER — CEFAZOLIN SODIUM-DEXTROSE 2-4 GM/100ML-% IV SOLN
2.0000 g | INTRAVENOUS | Status: AC
  Administered 2022-10-02: 2 g via INTRAVENOUS

## 2022-10-02 ENCOUNTER — Inpatient Hospital Stay: Payer: 59

## 2022-10-02 ENCOUNTER — Ambulatory Visit: Payer: 59 | Admitting: Family Medicine

## 2022-10-02 ENCOUNTER — Encounter
Admission: RE | Disposition: A | Discharge status: Skilled Nursing Facility | Payer: Self-pay | Source: Home / Self Care | Attending: Neurosurgery

## 2022-10-02 ENCOUNTER — Inpatient Hospital Stay: Payer: 59 | Admitting: Urgent Care

## 2022-10-02 ENCOUNTER — Other Ambulatory Visit: Payer: Self-pay

## 2022-10-02 ENCOUNTER — Inpatient Hospital Stay
Admission: RE | Admit: 2022-10-02 | Discharge: 2022-10-09 | DRG: 460 | Disposition: A | Discharge status: Skilled Nursing Facility | Payer: 59 | Attending: Neurosurgery | Admitting: Neurosurgery

## 2022-10-02 ENCOUNTER — Encounter: Payer: Self-pay | Admitting: Neurosurgery

## 2022-10-02 DIAGNOSIS — Z811 Family history of alcohol abuse and dependence: Secondary | ICD-10-CM | POA: Diagnosis not present

## 2022-10-02 DIAGNOSIS — G4730 Sleep apnea, unspecified: Secondary | ICD-10-CM | POA: Diagnosis not present

## 2022-10-02 DIAGNOSIS — K219 Gastro-esophageal reflux disease without esophagitis: Secondary | ICD-10-CM | POA: Diagnosis not present

## 2022-10-02 DIAGNOSIS — E1165 Type 2 diabetes mellitus with hyperglycemia: Secondary | ICD-10-CM | POA: Diagnosis not present

## 2022-10-02 DIAGNOSIS — K5900 Constipation, unspecified: Secondary | ICD-10-CM | POA: Diagnosis not present

## 2022-10-02 DIAGNOSIS — J449 Chronic obstructive pulmonary disease, unspecified: Secondary | ICD-10-CM | POA: Diagnosis not present

## 2022-10-02 DIAGNOSIS — Z8261 Family history of arthritis: Secondary | ICD-10-CM

## 2022-10-02 DIAGNOSIS — R6889 Other general symptoms and signs: Secondary | ICD-10-CM | POA: Diagnosis not present

## 2022-10-02 DIAGNOSIS — E1142 Type 2 diabetes mellitus with diabetic polyneuropathy: Secondary | ICD-10-CM | POA: Diagnosis not present

## 2022-10-02 DIAGNOSIS — Z880 Allergy status to penicillin: Secondary | ICD-10-CM | POA: Diagnosis not present

## 2022-10-02 DIAGNOSIS — F17210 Nicotine dependence, cigarettes, uncomplicated: Secondary | ICD-10-CM | POA: Diagnosis present

## 2022-10-02 DIAGNOSIS — Z743 Need for continuous supervision: Secondary | ICD-10-CM | POA: Diagnosis not present

## 2022-10-02 DIAGNOSIS — Z79899 Other long term (current) drug therapy: Secondary | ICD-10-CM

## 2022-10-02 DIAGNOSIS — Z833 Family history of diabetes mellitus: Secondary | ICD-10-CM | POA: Diagnosis not present

## 2022-10-02 DIAGNOSIS — Z8249 Family history of ischemic heart disease and other diseases of the circulatory system: Secondary | ICD-10-CM

## 2022-10-02 DIAGNOSIS — E785 Hyperlipidemia, unspecified: Secondary | ICD-10-CM | POA: Diagnosis not present

## 2022-10-02 DIAGNOSIS — I10 Essential (primary) hypertension: Secondary | ICD-10-CM | POA: Diagnosis not present

## 2022-10-02 DIAGNOSIS — Z841 Family history of disorders of kidney and ureter: Secondary | ICD-10-CM | POA: Diagnosis not present

## 2022-10-02 DIAGNOSIS — E119 Type 2 diabetes mellitus without complications: Secondary | ICD-10-CM | POA: Diagnosis not present

## 2022-10-02 DIAGNOSIS — M5414 Radiculopathy, thoracic region: Secondary | ICD-10-CM | POA: Diagnosis not present

## 2022-10-02 DIAGNOSIS — Z7984 Long term (current) use of oral hypoglycemic drugs: Secondary | ICD-10-CM | POA: Diagnosis not present

## 2022-10-02 DIAGNOSIS — Z4789 Encounter for other orthopedic aftercare: Secondary | ICD-10-CM | POA: Diagnosis not present

## 2022-10-02 DIAGNOSIS — Z7951 Long term (current) use of inhaled steroids: Secondary | ICD-10-CM | POA: Diagnosis not present

## 2022-10-02 DIAGNOSIS — Z800 Family history of malignant neoplasm of digestive organs: Secondary | ICD-10-CM

## 2022-10-02 DIAGNOSIS — M4714 Other spondylosis with myelopathy, thoracic region: Secondary | ICD-10-CM | POA: Diagnosis present

## 2022-10-02 DIAGNOSIS — Z981 Arthrodesis status: Principal | ICD-10-CM

## 2022-10-02 DIAGNOSIS — M4804 Spinal stenosis, thoracic region: Secondary | ICD-10-CM | POA: Diagnosis present

## 2022-10-02 DIAGNOSIS — Z01818 Encounter for other preprocedural examination: Secondary | ICD-10-CM

## 2022-10-02 DIAGNOSIS — H548 Legal blindness, as defined in USA: Secondary | ICD-10-CM | POA: Diagnosis present

## 2022-10-02 DIAGNOSIS — Z01812 Encounter for preprocedural laboratory examination: Secondary | ICD-10-CM

## 2022-10-02 HISTORY — PX: APPLICATION OF INTRAOPERATIVE CT SCAN: SHX6668

## 2022-10-02 LAB — GLUCOSE, CAPILLARY
Glucose-Capillary: 179 mg/dL — ABNORMAL HIGH (ref 70–99)
Glucose-Capillary: 196 mg/dL — ABNORMAL HIGH (ref 70–99)
Glucose-Capillary: 157 mg/dL — ABNORMAL HIGH (ref 70–99)
Glucose-Capillary: 214 mg/dL — ABNORMAL HIGH (ref 70–99)

## 2022-10-02 SURGERY — POSTERIOR THORACIC FUSION 1 LEVEL
Anesthesia: General

## 2022-10-02 MED ORDER — REMIFENTANIL HCL 1 MG IV SOLR
INTRAVENOUS | Status: DC | PRN
  Administered 2022-10-02: .15 ug/kg/min via INTRAVENOUS

## 2022-10-02 MED ORDER — LIDOCAINE HCL (PF) 2 % IJ SOLN
INTRAMUSCULAR | Status: AC
  Filled 2022-10-02: qty 10

## 2022-10-02 MED ORDER — OXYCODONE HCL 5 MG PO TABS
10.0000 mg | ORAL_TABLET | ORAL | Status: DC | PRN
  Administered 2022-10-02 – 2022-10-09 (×21): 10 mg via ORAL
  Filled 2022-10-02 (×23): qty 2

## 2022-10-02 MED ORDER — SUCCINYLCHOLINE CHLORIDE 200 MG/10ML IV SOSY
PREFILLED_SYRINGE | INTRAVENOUS | Status: DC | PRN
  Administered 2022-10-02: 100 mg via INTRAVENOUS

## 2022-10-02 MED ORDER — BISACODYL 10 MG RE SUPP
10.0000 mg | Freq: Every day | RECTAL | Status: DC | PRN
  Administered 2022-10-05 – 2022-10-07 (×2): 10 mg via RECTAL
  Filled 2022-10-02 (×2): qty 1

## 2022-10-02 MED ORDER — GABAPENTIN 300 MG PO CAPS
300.0000 mg | ORAL_CAPSULE | Freq: Two times a day (BID) | ORAL | Status: DC
  Administered 2022-10-02 – 2022-10-09 (×14): 300 mg via ORAL
  Filled 2022-10-02 (×14): qty 1

## 2022-10-02 MED ORDER — GLYCOPYRROLATE 0.2 MG/ML IJ SOLN
INTRAMUSCULAR | Status: DC | PRN
  Administered 2022-10-02: .2 mg via INTRAVENOUS

## 2022-10-02 MED ORDER — SODIUM CHLORIDE FLUSH 0.9 % IV SOLN
INTRAVENOUS | Status: AC
  Filled 2022-10-02: qty 20

## 2022-10-02 MED ORDER — ACETAMINOPHEN 10 MG/ML IV SOLN
INTRAVENOUS | Status: DC | PRN
  Administered 2022-10-02: 1000 mg via INTRAVENOUS

## 2022-10-02 MED ORDER — METHOCARBAMOL 500 MG PO TABS
500.0000 mg | ORAL_TABLET | Freq: Four times a day (QID) | ORAL | Status: DC | PRN
  Administered 2022-10-02 – 2022-10-09 (×4): 500 mg via ORAL
  Filled 2022-10-02 (×4): qty 1

## 2022-10-02 MED ORDER — ESMOLOL HCL 100 MG/10ML IV SOLN
INTRAVENOUS | Status: DC | PRN
  Administered 2022-10-02: 10 mg via INTRAVENOUS

## 2022-10-02 MED ORDER — ALBUTEROL SULFATE HFA 108 (90 BASE) MCG/ACT IN AERS
INHALATION_SPRAY | RESPIRATORY_TRACT | Status: DC | PRN
  Administered 2022-10-02: 2 via RESPIRATORY_TRACT

## 2022-10-02 MED ORDER — EPHEDRINE SULFATE (PRESSORS) 50 MG/ML IJ SOLN
INTRAMUSCULAR | Status: DC | PRN
  Administered 2022-10-02 (×2): 5 mg via INTRAVENOUS

## 2022-10-02 MED ORDER — OXYCODONE HCL 5 MG PO TABS
5.0000 mg | ORAL_TABLET | ORAL | Status: DC | PRN
  Administered 2022-10-02 – 2022-10-09 (×2): 5 mg via ORAL
  Filled 2022-10-02: qty 1

## 2022-10-02 MED ORDER — FENTANYL CITRATE (PF) 100 MCG/2ML IJ SOLN
INTRAMUSCULAR | Status: AC
  Filled 2022-10-02: qty 2

## 2022-10-02 MED ORDER — PHENYLEPHRINE HCL (PRESSORS) 10 MG/ML IV SOLN
INTRAVENOUS | Status: DC | PRN
  Administered 2022-10-02: 160 ug via INTRAVENOUS
  Administered 2022-10-02: 80 ug via INTRAVENOUS
  Administered 2022-10-02 (×3): 160 ug via INTRAVENOUS

## 2022-10-02 MED ORDER — FENTANYL CITRATE (PF) 100 MCG/2ML IJ SOLN
INTRAMUSCULAR | Status: DC | PRN
  Administered 2022-10-02: 50 ug via INTRAVENOUS

## 2022-10-02 MED ORDER — PROPOFOL 1000 MG/100ML IV EMUL
INTRAVENOUS | Status: AC
  Filled 2022-10-02: qty 100

## 2022-10-02 MED ORDER — INSULIN ASPART 100 UNIT/ML IJ SOLN
0.0000 [IU] | Freq: Every day | INTRAMUSCULAR | Status: DC
  Administered 2022-10-02 – 2022-10-04 (×2): 2 [IU] via SUBCUTANEOUS
  Filled 2022-10-02 (×2): qty 1

## 2022-10-02 MED ORDER — HYDROMORPHONE HCL 1 MG/ML IJ SOLN: 0.5000 mg | INTRAMUSCULAR | Status: DC | PRN

## 2022-10-02 MED ORDER — ATORVASTATIN CALCIUM 20 MG PO TABS
40.0000 mg | ORAL_TABLET | Freq: Every day | ORAL | Status: DC
  Administered 2022-10-03 – 2022-10-09 (×7): 40 mg via ORAL
  Filled 2022-10-02 (×7): qty 2

## 2022-10-02 MED ORDER — ACETAMINOPHEN 500 MG PO TABS
1000.0000 mg | ORAL_TABLET | Freq: Four times a day (QID) | ORAL | Status: DC
  Administered 2022-10-02 – 2022-10-09 (×26): 1000 mg via ORAL
  Filled 2022-10-02 (×28): qty 2

## 2022-10-02 MED ORDER — ONDANSETRON HCL 4 MG/2ML IJ SOLN
INTRAMUSCULAR | Status: DC | PRN
  Administered 2022-10-02 (×2): 4 mg via INTRAVENOUS

## 2022-10-02 MED ORDER — PROPOFOL 500 MG/50ML IV EMUL
INTRAVENOUS | Status: DC | PRN
  Administered 2022-10-02: 145 ug/kg/min via INTRAVENOUS

## 2022-10-02 MED ORDER — ALBUTEROL SULFATE (2.5 MG/3ML) 0.083% IN NEBU: 3.0000 mL | INHALATION_SOLUTION | Freq: Four times a day (QID) | RESPIRATORY_TRACT | Status: DC | PRN

## 2022-10-02 MED ORDER — MIDAZOLAM HCL 2 MG/2ML IJ SOLN
INTRAMUSCULAR | Status: DC | PRN
  Administered 2022-10-02: 2 mg via INTRAVENOUS

## 2022-10-02 MED ORDER — EPINEPHRINE PF 1 MG/ML IJ SOLN
INTRAMUSCULAR | Status: AC
  Filled 2022-10-02: qty 1

## 2022-10-02 MED ORDER — VANCOMYCIN HCL IN DEXTROSE 1-5 GM/200ML-% IV SOLN
INTRAVENOUS | Status: AC
  Filled 2022-10-02: qty 200

## 2022-10-02 MED ORDER — MENTHOL 3 MG MT LOZG: 1.0000 | LOZENGE | OROMUCOSAL | Status: DC | PRN

## 2022-10-02 MED ORDER — BUPIVACAINE LIPOSOME 1.3 % IJ SUSP
INTRAMUSCULAR | Status: AC
  Filled 2022-10-02: qty 20

## 2022-10-02 MED ORDER — BUPIVACAINE HCL (PF) 0.5 % IJ SOLN
INTRAMUSCULAR | Status: AC
  Filled 2022-10-02: qty 60

## 2022-10-02 MED ORDER — KETOROLAC TROMETHAMINE 15 MG/ML IJ SOLN
7.5000 mg | Freq: Four times a day (QID) | INTRAMUSCULAR | Status: AC
  Administered 2022-10-02 – 2022-10-03 (×4): 7.5 mg via INTRAVENOUS
  Filled 2022-10-02 (×4): qty 1

## 2022-10-02 MED ORDER — ACETAMINOPHEN 10 MG/ML IV SOLN
INTRAVENOUS | Status: AC
  Filled 2022-10-02: qty 100

## 2022-10-02 MED ORDER — SODIUM CHLORIDE 0.9 % IV SOLN: INTRAVENOUS | Status: DC

## 2022-10-02 MED ORDER — POLYETHYLENE GLYCOL 3350 17 G PO PACK
17.0000 g | PACK | Freq: Every day | ORAL | Status: DC | PRN
  Administered 2022-10-04: 17 g via ORAL
  Filled 2022-10-02: qty 1

## 2022-10-02 MED ORDER — EPHEDRINE 5 MG/ML INJ
INTRAVENOUS | Status: AC
  Filled 2022-10-02: qty 5

## 2022-10-02 MED ORDER — PHENOL 1.4 % MT LIQD: 1.0000 | OROMUCOSAL | Status: DC | PRN

## 2022-10-02 MED ORDER — METHOCARBAMOL 1000 MG/10ML IJ SOLN
500.0000 mg | Freq: Four times a day (QID) | INTRAVENOUS | Status: DC | PRN
  Administered 2022-10-02: 500 mg via INTRAVENOUS
  Filled 2022-10-02: qty 500

## 2022-10-02 MED ORDER — REMIFENTANIL HCL 1 MG IV SOLR
INTRAVENOUS | Status: AC
  Filled 2022-10-02: qty 1000

## 2022-10-02 MED ORDER — SODIUM CHLORIDE 0.9 % IV SOLN: 250.0000 mL | INTRAVENOUS | Status: DC

## 2022-10-02 MED ORDER — DEXAMETHASONE SODIUM PHOSPHATE 10 MG/ML IJ SOLN
INTRAMUSCULAR | Status: DC | PRN
  Administered 2022-10-02: 10 mg via INTRAVENOUS

## 2022-10-02 MED ORDER — HYDROMORPHONE HCL 1 MG/ML IJ SOLN
INTRAMUSCULAR | Status: AC
  Filled 2022-10-02: qty 1

## 2022-10-02 MED ORDER — VITAMIN B-12 1000 MCG PO TABS
1000.0000 ug | ORAL_TABLET | Freq: Every day | ORAL | Status: DC
  Administered 2022-10-02 – 2022-10-09 (×8): 1000 ug via ORAL
  Filled 2022-10-02 (×8): qty 1

## 2022-10-02 MED ORDER — SODIUM CHLORIDE 0.9% FLUSH
3.0000 mL | Freq: Two times a day (BID) | INTRAVENOUS | Status: DC
  Administered 2022-10-02 – 2022-10-09 (×14): 3 mL via INTRAVENOUS

## 2022-10-02 MED ORDER — ONDANSETRON HCL 4 MG PO TABS: 4.0000 mg | ORAL_TABLET | Freq: Four times a day (QID) | ORAL | Status: DC | PRN

## 2022-10-02 MED ORDER — BUPIVACAINE-EPINEPHRINE (PF) 0.5% -1:200000 IJ SOLN
INTRAMUSCULAR | Status: DC | PRN
  Administered 2022-10-02: 6 mL via PERINEURAL

## 2022-10-02 MED ORDER — OXYCODONE HCL 5 MG/5ML PO SOLN: 5.0000 mg | Freq: Once | ORAL | Status: DC | PRN

## 2022-10-02 MED ORDER — ONDANSETRON HCL 4 MG/2ML IJ SOLN: 4.0000 mg | Freq: Four times a day (QID) | INTRAMUSCULAR | Status: DC | PRN

## 2022-10-02 MED ORDER — OXYCODONE HCL 5 MG PO TABS: 5.0000 mg | ORAL_TABLET | Freq: Once | ORAL | Status: DC | PRN

## 2022-10-02 MED ORDER — SURGIFLO WITH THROMBIN (HEMOSTATIC MATRIX KIT) OPTIME
TOPICAL | Status: DC | PRN
  Administered 2022-10-02 (×2): 1 via TOPICAL

## 2022-10-02 MED ORDER — FOLIC ACID 1 MG PO TABS
1.0000 mg | ORAL_TABLET | Freq: Every day | ORAL | Status: DC
  Administered 2022-10-02 – 2022-10-09 (×8): 1 mg via ORAL
  Filled 2022-10-02 (×8): qty 1

## 2022-10-02 MED ORDER — INSULIN ASPART 100 UNIT/ML IJ SOLN
0.0000 [IU] | Freq: Three times a day (TID) | INTRAMUSCULAR | Status: DC
  Administered 2022-10-02: 3 [IU] via SUBCUTANEOUS
  Administered 2022-10-03: 2 [IU] via SUBCUTANEOUS
  Administered 2022-10-03 – 2022-10-04 (×3): 3 [IU] via SUBCUTANEOUS
  Administered 2022-10-05: 2 [IU] via SUBCUTANEOUS
  Administered 2022-10-05: 5 [IU] via SUBCUTANEOUS
  Administered 2022-10-05: 3 [IU] via SUBCUTANEOUS
  Administered 2022-10-06 (×3): 2 [IU] via SUBCUTANEOUS
  Administered 2022-10-07 (×2): 3 [IU] via SUBCUTANEOUS
  Administered 2022-10-07: 5 [IU] via SUBCUTANEOUS
  Administered 2022-10-08 (×2): 3 [IU] via SUBCUTANEOUS
  Administered 2022-10-09: 2 [IU] via SUBCUTANEOUS
  Administered 2022-10-09: 3 [IU] via SUBCUTANEOUS
  Filled 2022-10-02 (×19): qty 1

## 2022-10-02 MED ORDER — FAMOTIDINE 20 MG PO TABS
ORAL_TABLET | ORAL | Status: AC
  Filled 2022-10-02: qty 1

## 2022-10-02 MED ORDER — ENOXAPARIN SODIUM 40 MG/0.4ML IJ SOSY
40.0000 mg | PREFILLED_SYRINGE | INTRAMUSCULAR | Status: DC
  Administered 2022-10-03 – 2022-10-09 (×7): 40 mg via SUBCUTANEOUS
  Filled 2022-10-02 (×7): qty 0.4

## 2022-10-02 MED ORDER — SODIUM CHLORIDE (PF) 0.9 % IJ SOLN
INTRAMUSCULAR | Status: DC | PRN
  Administered 2022-10-02: 56 mL

## 2022-10-02 MED ORDER — MIDAZOLAM HCL 2 MG/2ML IJ SOLN
INTRAMUSCULAR | Status: AC
  Filled 2022-10-02: qty 2

## 2022-10-02 MED ORDER — PROPOFOL 10 MG/ML IV BOLUS
INTRAVENOUS | Status: DC | PRN
  Administered 2022-10-02: 50 mg via INTRAVENOUS
  Administered 2022-10-02: 150 mg via INTRAVENOUS

## 2022-10-02 MED ORDER — SENNA 8.6 MG PO TABS
1.0000 | ORAL_TABLET | Freq: Two times a day (BID) | ORAL | Status: DC
  Administered 2022-10-02 – 2022-10-09 (×12): 8.6 mg via ORAL
  Filled 2022-10-02 (×14): qty 1

## 2022-10-02 MED ORDER — SURGIRINSE WOUND IRRIGATION SYSTEM - OPTIME
TOPICAL | Status: DC | PRN
  Administered 2022-10-02: 900 mL via TOPICAL

## 2022-10-02 MED ORDER — ESMOLOL HCL 100 MG/10ML IV SOLN
INTRAVENOUS | Status: AC
  Filled 2022-10-02: qty 20

## 2022-10-02 MED ORDER — ONDANSETRON HCL 4 MG/2ML IJ SOLN
INTRAMUSCULAR | Status: AC
  Filled 2022-10-02: qty 8

## 2022-10-02 MED ORDER — HYDROMORPHONE HCL 1 MG/ML IJ SOLN
0.5000 mg | INTRAMUSCULAR | Status: DC | PRN
  Administered 2022-10-02: 0.5 mg via INTRAVENOUS
  Filled 2022-10-02: qty 1

## 2022-10-02 MED ORDER — FENTANYL CITRATE (PF) 100 MCG/2ML IJ SOLN
25.0000 ug | INTRAMUSCULAR | Status: DC | PRN
  Administered 2022-10-02: 25 ug via INTRAVENOUS
  Administered 2022-10-02: 50 ug via INTRAVENOUS
  Administered 2022-10-02: 25 ug via INTRAVENOUS

## 2022-10-02 MED ORDER — CEFAZOLIN SODIUM-DEXTROSE 2-4 GM/100ML-% IV SOLN
INTRAVENOUS | Status: AC
  Filled 2022-10-02: qty 100

## 2022-10-02 MED ORDER — CHLORHEXIDINE GLUCONATE 0.12 % MT SOLN
OROMUCOSAL | Status: AC
  Filled 2022-10-02: qty 15

## 2022-10-02 MED ORDER — GLYCOPYRROLATE 0.2 MG/ML IJ SOLN
INTRAMUSCULAR | Status: AC
  Filled 2022-10-02: qty 3

## 2022-10-02 MED ORDER — SUCCINYLCHOLINE CHLORIDE 200 MG/10ML IV SOSY
PREFILLED_SYRINGE | INTRAVENOUS | Status: AC
  Filled 2022-10-02: qty 10

## 2022-10-02 MED ORDER — SODIUM CHLORIDE (PF) 0.9 % IJ SOLN
INTRAMUSCULAR | Status: AC
  Filled 2022-10-02: qty 20

## 2022-10-02 MED ORDER — DOCUSATE SODIUM 100 MG PO CAPS
100.0000 mg | ORAL_CAPSULE | Freq: Two times a day (BID) | ORAL | Status: DC
  Administered 2022-10-02 – 2022-10-09 (×12): 100 mg via ORAL
  Filled 2022-10-02 (×14): qty 1

## 2022-10-02 MED ORDER — MAGNESIUM CITRATE PO SOLN: 1.0000 | Freq: Once | ORAL | Status: DC | PRN

## 2022-10-02 MED ORDER — CEFAZOLIN SODIUM 1 G IJ SOLR
INTRAMUSCULAR | Status: AC
  Filled 2022-10-02: qty 20

## 2022-10-02 MED ORDER — PHENYLEPHRINE HCL-NACL 20-0.9 MG/250ML-% IV SOLN
INTRAVENOUS | Status: DC | PRN
  Administered 2022-10-02: 50 ug/min via INTRAVENOUS
  Administered 2022-10-02: 30 ug/min via INTRAVENOUS

## 2022-10-02 MED ORDER — SODIUM CHLORIDE 0.9% FLUSH: 3.0000 mL | INTRAVENOUS | Status: DC | PRN

## 2022-10-02 SURGICAL SUPPLY — 60 items
ADH SKN CLS APL DERMABOND .7 (GAUZE/BANDAGES/DRESSINGS) ×1
AGENT HMST KT MTR STRL THRMB (HEMOSTASIS) ×1
ALLOGRAFT BONE FIBER KORE 5 (Bone Implant) IMPLANT
BASIN KIT SINGLE STR (MISCELLANEOUS) ×1 IMPLANT
BUR NEURO DRILL SOFT 3.0X3.8M (BURR) ×1 IMPLANT
CNTNR URN SCR LID CUP LEK RST (MISCELLANEOUS) ×1 IMPLANT
CONT SPEC 4OZ STRL OR WHT (MISCELLANEOUS) ×1
COVERAGE SUPP BRAINLAB NG SPNE (MISCELLANEOUS) IMPLANT
COVERAGE SUPPORT SPINE BRAINLB (MISCELLANEOUS)
CUP MEDICINE 2OZ PLAST GRAD ST (MISCELLANEOUS) ×1 IMPLANT
DERMABOND ADVANCED .7 DNX12 (GAUZE/BANDAGES/DRESSINGS) ×1 IMPLANT
DRAPE C ARM PK CFD 31 SPINE (DRAPES) ×1 IMPLANT
DRAPE LAPAROTOMY 100X77 ABD (DRAPES) ×1 IMPLANT
DRAPE MICROSCOPE SPINE 48X150 (DRAPES) ×1 IMPLANT
DRAPE SCAN PATIENT (DRAPES) ×1 IMPLANT
ELECT CAUTERY BLADE TIP 2.5 (TIP) ×2
ELECT EZSTD 165MM 6.5IN (MISCELLANEOUS)
ELECT REM PT RETURN 9FT ADLT (ELECTROSURGICAL) ×1
ELECTRODE CAUTERY BLDE TIP 2.5 (TIP) ×2 IMPLANT
ELECTRODE EZSTD 165MM 6.5IN (MISCELLANEOUS) IMPLANT
ELECTRODE REM PT RTRN 9FT ADLT (ELECTROSURGICAL) ×1 IMPLANT
EX-PIN ORTHOLOCK NAV 4X150 (PIN) IMPLANT
FEE CVG SUPP BRAINLAB NG SPNE (MISCELLANEOUS) IMPLANT
GLOVE BIOGEL PI IND STRL 6.5 (GLOVE) ×1 IMPLANT
GLOVE BIOGEL PI IND STRL 8.5 (GLOVE) ×1 IMPLANT
GLOVE SURG SYN 6.5 ES PF (GLOVE) ×3 IMPLANT
GLOVE SURG SYN 6.5 PF PI (GLOVE) ×2 IMPLANT
GLOVE SURG SYN 8.5  E (GLOVE) ×3
GLOVE SURG SYN 8.5 E (GLOVE) ×3 IMPLANT
GLOVE SURG SYN 8.5 PF PI (GLOVE) ×3 IMPLANT
GOWN SRG LRG LVL 4 IMPRV REINF (GOWNS) ×1 IMPLANT
GOWN SRG XL LVL 3 NONREINFORCE (GOWNS) ×1 IMPLANT
GOWN STRL NON-REIN TWL XL LVL3 (GOWNS) ×1
GOWN STRL REIN LRG LVL4 (GOWNS) ×1
HEMOVAC 400CC 10FR (MISCELLANEOUS) IMPLANT
KIT PREVENA INCISION MGT 13 (CANNISTER) ×1 IMPLANT
KIT SPINAL PRONEVIEW (KITS) ×1 IMPLANT
MANIFOLD NEPTUNE II (INSTRUMENTS) ×1 IMPLANT
MARKER SKIN DUAL TIP RULER LAB (MISCELLANEOUS) ×1 IMPLANT
MARKER SPHERE PSV REFLC 13MM (MARKER) ×7 IMPLANT
NDL SAFETY ECLIP 18X1.5 (MISCELLANEOUS) ×1 IMPLANT
NS IRRIG 1000ML POUR BTL (IV SOLUTION) ×1 IMPLANT
PACK LAMINECTOMY NEURO (CUSTOM PROCEDURE TRAY) ×1 IMPLANT
PAD ARMBOARD 7.5X6 YLW CONV (MISCELLANEOUS) ×1 IMPLANT
ROD RELINE-O LORD 5.5X40 (Rod) IMPLANT
SCREW LOCK RELINE 5.5 TULIP (Screw) IMPLANT
SCREW RELINE-O POLY 6.5X40 (Screw) IMPLANT
SOLUTION IRRIG SURGIPHOR (IV SOLUTION) ×1 IMPLANT
STAPLER SKIN PROX 35W (STAPLE) IMPLANT
SURGIFLO W/THROMBIN 8M KIT (HEMOSTASIS) ×1 IMPLANT
SUT DVC VLOC 3-0 CL 6 P-12 (SUTURE) ×1 IMPLANT
SUT ETH BLK MONO 3 0 FS 1 12/B (SUTURE) IMPLANT
SUT VIC AB 0 CT1 27 (SUTURE) ×2
SUT VIC AB 0 CT1 27XCR 8 STRN (SUTURE) ×2 IMPLANT
SUT VIC AB 2-0 CT1 18 (SUTURE) ×2 IMPLANT
SYR 10ML LL (SYRINGE) ×1 IMPLANT
SYR 30ML LL (SYRINGE) ×2 IMPLANT
TOWEL OR 17X26 4PK STRL BLUE (TOWEL DISPOSABLE) ×2 IMPLANT
TRAP FLUID SMOKE EVACUATOR (MISCELLANEOUS) ×1 IMPLANT
WATER STERILE IRR 1000ML POUR (IV SOLUTION) ×2 IMPLANT

## 2022-10-02 NOTE — Transfer of Care (Signed)
Immediate Anesthesia Transfer of Care Note  Patient: [name]  Procedure(s) Performed: OPEN T11-12 POSTERIOR SPINAL FUSION WITH DECOMPRESSION APPLICATION OF INTRAOPERATIVE CT SCAN  Patient Location: PACU  Anesthesia Type:General  Level of Consciousness: awake, drowsy, and patient cooperative  Airway & Oxygen Therapy: Patient Spontanous Breathing and Patient connected to face mask oxygen  Post-op Assessment: Report given to RN and Post -op Vital signs reviewed and stable  Post vital signs: Reviewed and stable  Last Vitals:  Vitals Value Taken Time  BP 108/60 10/02/22 1433  Temp    Pulse 124 10/02/22 1436  Resp 19 10/02/22 1436  SpO2 98 % 10/02/22 1436  Vitals shown include unvalidated device data.  Last Pain:  Vitals:   10/02/22 0938  TempSrc: Temporal  PainSc: 4          Complications: No notable events documented.

## 2022-10-02 NOTE — H&P (Signed)
Referring Physician:  No referring provider defined for this encounter.  Primary Physician:  Alba Cory, MD  History of Present Illness: 10/02/2022 [name] is here today for intervention for her thoracic myelopathy.  08/31/2022 She continues to have issues with her balance.  08/03/2022 [name] returns to see me.  She is having some pain down her legs.  Her daughter reports that she is having some increased issues with her balance and sometimes has to use assistance in order not to fall.  She has not had any falls.  She denies weakness or bowel or bladder dysfunction.  03/02/2022 [name] is here today with stable findings.  She has no new concerning findings.  She does have some numbness in her right leg when she walks.    02/24/2021  [name] continues to have stable symptoms for her balance. She just tarted physical therapy for her leg. She is currently doing well.  08/05/2020 [name] returns to see me. She feels that her symptoms are overall relatively stable. She continues to have some numbness and discomfort in her right leg prickly when she walks. She has no falls. She has no worsening numbness.  She is now smoking 2 cigarettes/day.  01/29/2020 [name] returns to see me. Her symptoms are relatively stable. She denies any falls. She continues to have some pain down her right leg in an L5 distribution. Overall, she is not substantially worse than she was 2 months ago. Her last injection did help her.  11/27/19 She has had 2 injections since I last saw her. She had a right-sided S1 injection Julie Wall November 11, 2019 and Oct 16, 2019.  She has had some mild issues with walking, with her right leg dragging at times. Overall, she feels relatively stable. Her back is feeling much better.  09/25/2019 [name] is here today with a chief complaint of right posterior leg pain, numbness in bottom of right foot. She feels that her right leg is weak when walking and it gives  out Julie Wall her causing her to fall at times. She does report issues with balance.  She has been having problems for approximately 1 year. She reports sharp and aching pain. Her pain is as bad as 8 of the 10. She gets discomfort in the back of her legs and the bottom of her right foot. Walking aggravates her. She is able to walk 5 to 10 minutes before she has to stop due to pain. Nothing really helps.  She denies overt weakness or bowel or bladder dysfunction. She has not had any recent falls.   Review of Systems:  A 10 point review of systems is negative, except for the pertinent positives and negatives detailed in the HPI.  Past Medical History: Past Medical History:  Diagnosis Date   Allergy    Arthritis    ASCUS favor benign 09/2013   negative HPV   Atopic dermatitis    Dermatologist at [facility]   Chronic low back pain    COPD (chronic obstructive pulmonary disease) (HCC)    Decreased exercise tolerance    Dental caries    Diabetes mellitus without complication (HCC)    Dyspnea    GERD (gastroesophageal reflux disease)    Hand dermatitis 10/13/2021   a.) PCR (+) 10/13/2021 and 08/17/2022 for MSSA + SDSE (Streptococcus dysgalactiae ss equisimilis)   Hyperlipidemia    Hypertension    Legally blind in left eye, as defined in [place]  Lumbosacral neuritis    Microalbuminuria    Osteopenia    Ovarian failure    Sleep apnea    no cpap   Tobacco use     Past Surgical History: Past Surgical History:  Procedure Laterality Date   CATARACT EXTRACTION Bilateral 06/05/1978   CESAREAN SECTION     x 2   CORNEAL TRANSPLANT Left    [facility] for blindness.    Allergies: Allergies as of 09/01/2022 - Review Complete 08/31/2022  Allergen Reaction Noted   Penicillins Rash 12/23/2014    Medications: Current Meds  Medication Sig   albuterol (VENTOLIN HFA) 108 (90 Base) MCG/ACT inhaler Inhale 2 puffs into the lungs every 6 (six) hours as needed for wheezing or shortness of breath.    atorvastatin (LIPITOR) 40 MG tablet Take 1 tablet (40 mg total) by mouth daily.   clobetasol ointment (TEMOVATE) 0.05 % Apply 1 application  topically 2 (two) times daily.   dorzolamide-timolol (COSOPT) 22.3-6.8 MG/ML ophthalmic solution Place 1 drop into both eyes 2 (two) times daily.    Fluticasone-Umeclidin-Vilant (TRELEGY ELLIPTA) 200-62.5-25 MCG/ACT AEPB Inhale 1 puff into the lungs daily.   folic acid (FOLVITE) 1 MG tablet Take 1 mg by mouth daily. Does not take Julie Wall Friday   gabapentin (NEURONTIN) 300 MG capsule Take 1 capsule by mouth 2 (two) times daily.   HYDROcodone-acetaminophen (NORCO/VICODIN) 5-325 MG tablet Take 2 tablets by mouth 2 (two) times daily as needed for moderate pain.    Social History: Social History   Tobacco Use   Smoking status: Every Day    Packs/day: 1.00    Years: 46.00    Additional pack years: 0.00    Total pack years: 46.00    Types: Cigarettes    Start date: 01/07/1985   Smokeless tobacco: Never   Tobacco comments:    3 cigarettes a day. 08/10/2022  Vaping Use   Vaping Use: Never used  Substance Use Topics   Alcohol use: No    Alcohol/week: 0.0 standard drinks of alcohol   Drug use: No    Family Medical History: Family History  Problem Relation Age of Onset   Diabetes Mother    Hypertension Mother    Pancreatic cancer Mother    Alcohol abuse Father    Arthritis/Rheumatoid Sister    Heart failure Sister    Diabetes Sister    Kidney disease Sister    Diabetes Brother    Breast cancer Neg Hx     Physical Examination: Vitals:   10/02/22 0938  BP: (!) 153/79  Pulse: 89  Resp: 16  Temp: 97.9 F (36.6 C)  SpO2: 92%    General: Patient is well developed, well nourished, calm, collected, and in no apparent distress. Attention to examination is appropriate.  Neck:   Supple.  Full range of motion.  Respiratory: Patient is breathing without any difficulty.  Heart sounds normal no MRG. Chest Clear to Auscultation  Bilaterally.  NEUROLOGICAL:     Awake, alert, oriented to person, place, and time.  Speech is clear and fluent. Fund of knowledge is appropriate.   Cranial Nerves: Pupils equal round and reactive to light.  Facial tone is symmetric.  Facial sensation is symmetric. Shoulder shrug is symmetric. Tongue protrusion is midline.  There is no pronator drift.  ROM of spine: full.    Strength: Side Biceps Triceps Deltoid Interossei Grip Wrist Ext. Wrist Flex.  R 5 5 5 5 5 5 5   L 5 5 5 5 5 5  5  Side Iliopsoas Quads Hamstring PF DF EHL  R 5 5 5 5 5 5   L 5 5 5 5 5 5    Reflexes are 2+ and symmetric at the biceps, triceps, brachioradialis, patella and achilles.   Hoffman's is absent.   Bilateral upper and lower extremity sensation is intact to light touch.    No evidence of dysmetria noted.  Gait is slightly wide-based.     Medical Decision Making  Imaging: MRI L spine 08/03/19 IMPRESSION:  1. Moderate spinal stenosis L3-4 unchanged. Moderate subarticular  stenosis bilaterally.  2. Severe spinal stenosis with progression since the prior study.  Severe subarticular and foraminal stenosis Julie Wall the left.  3. Moderate subarticular stenosis bilaterally L5-S1.  4. Progressive degenerative changes at T11-12 with significant  spinal stenosis and mild cord signal abnormality. Thoracic MRI with  dedicated axial images through this level may be helpful.   Electronically Signed    By: Marlan Palau M.D.    Julie Wall: 08/03/2019 06:38  MRI T spine 09/04/2019 IMPRESSION:  Diffuse degenerative disc narrowing and bulging with small  protrusions. Dominant findings at T11-12 where there is cord  impingement with flattening and mild T2 hyperintensity by prior  lumbar MRI.   Electronically Signed    By: Marnee Spring M.D.    Julie Wall: 09/04/2019 08:31  MRI T spine 07/27/2022 IMPRESSION: 1. At T11-T12, severe canal stenosis with cord deformity and mild STIR hyperintensity, compatible with cord edema and/or  myelomalacia. Moderate to severe bilateral foraminal stenosis at this level. 2. At T9-T10 and T10-T11, moderate canal stenosis with moderate bilateral foraminal stenosis. 3. Additional multilevel mild canal and foraminal stenosis in the thoracic spine. 4. Left lung opacity correlates with findings better characterized Julie Wall recent CT chest.     Electronically Signed   By: Feliberto Harts M.D.   Julie Wall: 07/27/2022 12:06  MRI L spine 07/25/2022 IMPRESSION: 1. Probably severe canal stenosis at T11-T12 and at least moderate canal stenosis at T10-T11 with cord compression and cord edema and/or myelomalacia at T11-T12, only imaged sagittally. Findings are likely progressed relative to 2021 prior, but recommend dedicated MRI of the thoracic spine to fully characterize. 2. At L4-L5, severe left subarticular recess stenosis with moderate to severe central canal stenosis and moderate left foraminal stenosis. 3. At L5-S1, moderate to severe right and moderate left foraminal stenosis. Mild canal stenosis. 4. At L3-L4, moderate canal stenosis with mild-to-moderate left foraminal stenosis.     Electronically Signed   By: Feliberto Harts M.D.   Julie Wall: 07/25/2022 12:29 I have personally reviewed the images and agree with the above interpretation.  Assessment and Plan: Julie Wall is a pleasant 67 y.o. female with symptoms concerning for development of thoracic myelopathy.  She has known severe stenosis at T11-12.  She also has symptoms of neurogenic claudication and low back pain due to her anterolisthesis at L4-5 causing severe stenosis.    She will proceed with T11-12 posterior spinal fusion with decompression.    Vergil Burby K. Myer Haff MD, [facility] Neurosurgery

## 2022-10-02 NOTE — Anesthesia Postprocedure Evaluation (Signed)
Anesthesia Post Note  Patient: Julie Wall  Procedure(s) Performed: OPEN T11-12 POSTERIOR SPINAL FUSION WITH DECOMPRESSION APPLICATION OF INTRAOPERATIVE CT SCAN  Patient location during evaluation: PACU Anesthesia Type: General Level of consciousness: awake and alert Pain management: pain level controlled Vital Signs Assessment: post-procedure vital signs reviewed and stable Respiratory status: spontaneous breathing, nonlabored ventilation, respiratory function stable and patient connected to nasal cannula oxygen Cardiovascular status: blood pressure returned to baseline and stable Postop Assessment: no apparent nausea or vomiting Anesthetic complications: no  No notable events documented.   Last Vitals:  Vitals:   10/02/22 0938 10/02/22 1433  BP: (!) 153/79 108/60  Pulse: 89 (!) 124  Resp: 16 19  Temp: 36.6 C 37.2 C  SpO2: 92% 99%    Last Pain:  Vitals:   10/02/22 1433  TempSrc:   PainSc: Asleep                 Stephanie Coup

## 2022-10-02 NOTE — Anesthesia Preprocedure Evaluation (Signed)
Anesthesia Evaluation  Patient identified by MRN, date of birth, ID band Patient awake    Reviewed: Allergy & Precautions, NPO status , Patient's Chart, lab work & pertinent test results  Airway Mallampati: IV  TM Distance: >3 FB Neck ROM: full    Dental  (+) Dental Advidsory Given, Teeth Intact   Pulmonary shortness of breath and with exertion, COPD,  COPD inhaler, Current Smoker and Patient abstained from smoking.   Pulmonary exam normal        Cardiovascular hypertension, (-) angina (-) Past MI negative cardio ROS Normal cardiovascular exam(-) dysrhythmias      Neuro/Psych negative neurological ROS  negative psych ROS   GI/Hepatic Neg liver ROS,GERD  Medicated,,  Endo/Other  negative endocrine ROSdiabetes    Renal/GU      Musculoskeletal   Abdominal   Peds  Hematology negative hematology ROS (+)   Anesthesia Other Findings Past Medical History: No date: Allergy No date: Arthritis 09/2013: ASCUS favor benign     Comment:  negative HPV No date: Atopic dermatitis     Comment:  Dermatologist at [facility] No date: Chronic low back pain No date: COPD (chronic obstructive pulmonary disease) (HCC) No date: Decreased exercise tolerance No date: Dental caries No date: Diabetes mellitus without complication (HCC) No date: Dyspnea No date: GERD (gastroesophageal reflux disease) 10/13/2021: Hand dermatitis     Comment:  a.) PCR (+) 10/13/2021 and 08/17/2022 for MSSA + SDSE               (Streptococcus dysgalactiae ss equisimilis) No date: Hyperlipidemia No date: Hypertension No date: Legally blind in left eye, as defined in [place] No date: Lumbosacral neuritis No date: Microalbuminuria No date: Osteopenia No date: Ovarian failure No date: Sleep apnea     Comment:  no cpap No date: Tobacco use  Past Surgical History: 06/05/1978: CATARACT EXTRACTION; Bilateral No date: CESAREAN SECTION     Comment:  x 2 No date:  CORNEAL TRANSPLANT; Left     Comment:  [facility] for blindness.  BMI    Body Mass Index: 32.52 kg/m      Reproductive/Obstetrics negative OB ROS                             Anesthesia Physical Anesthesia Plan  ASA: 3  Anesthesia Plan: General ETT   Post-op Pain Management:    Induction: Intravenous  PONV Risk Score and Plan: 3 and Ondansetron, Dexamethasone and Midazolam  Airway Management Planned: Oral ETT  Additional Equipment:   Intra-op Plan:   Post-operative Plan: Extubation in OR  Informed Consent: I have reviewed the patients History and Physical, chart, labs and discussed the procedure including the risks, benefits and alternatives for the proposed anesthesia with the patient or authorized representative who has indicated his/her understanding and acceptance.     Dental Advisory Given  Plan Discussed with: Anesthesiologist, CRNA and Surgeon  Anesthesia Plan Comments: (Patient consented for risks of anesthesia including but not limited to:  - adverse reactions to medications - damage to eyes, teeth, lips or other oral mucosa - nerve damage due to positioning  - sore throat or hoarseness - Damage to heart, brain, nerves, lungs, other parts of body or loss of life  Patient voiced understanding.)       Anesthesia Quick Evaluation

## 2022-10-02 NOTE — Anesthesia Procedure Notes (Signed)
Procedure Name: Intubation Date/Time: 10/02/2022 11:19 AM  Performed by: Mohammed Kindle, CRNAPre-anesthesia Checklist: Patient identified, Emergency Drugs available, Suction available and Patient being monitored Patient Re-evaluated:Patient Re-evaluated prior to induction Oxygen Delivery Method: Circle system utilized Preoxygenation: Pre-oxygenation with 100% oxygen Induction Type: IV induction Ventilation: Mask ventilation without difficulty Laryngoscope Size: McGraph and 3 Grade View: Grade I Tube type: Oral Tube size: 6.5 mm Number of attempts: 1 Airway Equipment and Method: Stylet Placement Confirmation: ETT inserted through vocal cords under direct vision, positive ETCO2, breath sounds checked- equal and bilateral and CO2 detector Secured at: 21 cm Tube secured with: Tape Dental Injury: Teeth and Oropharynx as per pre-operative assessment  Comments: Inserted by Hassel Neth

## 2022-10-02 NOTE — Op Note (Signed)
Indications: Ms. Dingus is a 67 yo female who presented with Thoracic spondylosis with myelopathy M47.14 , Spinal stenosis, thoracic - M48.04 .  She failed conservative management and observation prompting surgical intervention.  Findings: thoracic stenosis  Preoperative Diagnosis: Thoracic spondylosis with myelopathy M47.14 , Spinal stenosis, thoracic - M48.04 Postoperative Diagnosis: same   EBL: 50 ml IVF: see AR ml Drains: 1 placed Disposition: Extubated and Stable to PACU Complications: none  No foley catheter was placed.   Preoperative Note:   Risks of surgery discussed include: infection, bleeding, stroke, coma, death, paralysis, CSF leak, nerve/spinal cord injury, numbness, tingling, weakness, complex regional pain syndrome, recurrent stenosis and/or disc herniation, vascular injury, development of instability, neck/back pain, need for further surgery, persistent symptoms, development of deformity, and the risks of anesthesia. The patient understood these risks and agreed to proceed.  Operative Note:  1. Thoracic laminectomy with facetectomies T11/12 2. Posterolateral arthrodesis T11 to T12 3. Posterior nonsegmental instrumentation T11 to T12 4. Harvesting of autograft via the same incision 5. Use of stereotaxis    The patient was brought to the Operating Room, intubated and turned into the prone position. All pressure points were checked and double checked. Flouroscopy was used to mark the incision. The patient was prepped and draped in the standard fashion. A full timeout was performed. Preoperative antibiotics were given. The incision was injected with local anesthetic.  The incision was opened with a scalpel, then the soft tissues divided with the Bovie. Self-retaining retractors were placed. The paraspinus muscles were reflected laterally in subperiosteal fashion until the transverse processes were visible. Flouroscopy was used to confirm our localization.  The  stereotactic array was placed in the right iliac crest.  Stereotactic imaging was then acquired.  This confirmed our localization a second time.  After stereotactic images were acquired, the images were registered to the patient using [employer].  We then used stereotactic drill guides to cannulate the pedicles at T11 and T12 bilaterally.  The ball-tipped probe was used to confirm lack of breach.  6.5 x 40 mm screws were placed on the left at T11 and T12 and on the right at T11.  The right T12 screw was placed after decompression.  Using a posterior rib cutter, the T11 spinous process was removed.  Using a high-speed drill, the inferior two thirds of the T11 lamina was removed.  The inferior articulating processes of T11 were removed bilaterally.  Using high-speed drill, the superior articulating processes of T12 were disconnected bilaterally.  Using 1 and 2 mm Kerrison punches, the remainder of the superior articulating processes were removed.  The high-speed drill was used to thin the superior half of the T12 lamina.  This was then carefully removed.  By removing the entire superior and inferior articulating processes that constitute the T11-12 facet complex, complete facetectomy was performed bilaterally at T11-12.   At this point, the right T12 pedicle screw was placed.  Rods were measured and secured to the screws according to manufacturer's specifications.  We then confirmed placement of implants using the 3D C arm.     The wound was copiously irrigated, then the external surfaces of the remaining posterior elements from T11-12 were decorticated. A mixture of allograft and autograft was placed over the decorticated surfaces for arthrodesis.  A drain was placed subfascially.   After hemostasis, the wound was closed in layers with 0 and 2-0 vicryl. 3-0 monocryl and a wound vac  was applied to the incision.  The patient was then  flipped supine and extubated with incident. All counts were correct  times 2 at the end of the case. No immediate complications were noted.  Manning Charity PA assisted in the entire procedure. An assistant was required for this procedure due to the complexity.  The assistant provided assistance in tissue manipulation and suction, and was required for the successful and safe performance of the procedure. I performed the critical portions of the procedure.   Venetia Night MD

## 2022-10-03 ENCOUNTER — Encounter: Payer: Self-pay | Admitting: Neurosurgery

## 2022-10-03 LAB — GLUCOSE, CAPILLARY
Glucose-Capillary: 121 mg/dL — ABNORMAL HIGH (ref 70–99)
Glucose-Capillary: 116 mg/dL — ABNORMAL HIGH (ref 70–99)
Glucose-Capillary: 186 mg/dL — ABNORMAL HIGH (ref 70–99)
Glucose-Capillary: 160 mg/dL — ABNORMAL HIGH (ref 70–99)

## 2022-10-03 MED ORDER — UMECLIDINIUM BROMIDE 62.5 MCG/ACT IN AEPB
1.0000 | INHALATION_SPRAY | Freq: Every day | RESPIRATORY_TRACT | Status: DC
  Administered 2022-10-03 – 2022-10-09 (×7): 1 via RESPIRATORY_TRACT
  Filled 2022-10-03: qty 7

## 2022-10-03 MED ORDER — FLUTICASONE FUROATE-VILANTEROL 200-25 MCG/ACT IN AEPB
1.0000 | INHALATION_SPRAY | Freq: Every day | RESPIRATORY_TRACT | Status: DC
  Administered 2022-10-03 – 2022-10-09 (×7): 1 via RESPIRATORY_TRACT
  Filled 2022-10-03: qty 28

## 2022-10-03 NOTE — Discharge Instructions (Signed)
  Your surgeon has performed an operation on your spine  to relieve pressure on one or more nerves. Many times, patients feel better immediately after surgery and can "overdo it." Even if you feel well, it is important that you follow these activity guidelines. If you do not let your back heal properly from the surgery, you can increase the chance of hardware complications and/or return of your symptoms. The following are instructions to help in your recovery once you have been discharged from the hospital.  Do not use NSAIDs for 3 months after surgery.   Activity    No bending, lifting, or twisting ("BLT"). Avoid lifting objects heavier than 10 pounds (gallon milk jug).  Where possible, avoid household activities that involve lifting, bending, pushing, or pulling such as laundry, vacuuming, grocery shopping, and childcare. Try to arrange for help from friends and family for these activities while your back heals.  Increase physical activity slowly as tolerated.  Taking short walks is encouraged, but avoid strenuous exercise. Do not jog, run, bicycle, lift weights, or participate in any other exercises unless specifically allowed by your doctor. Avoid prolonged sitting, including car rides.  Talk to your doctor before resuming sexual activity.  You should not drive until cleared by your doctor.  Until released by your doctor, you should not return to work or school.  You should rest at home and let your body heal.   You may shower three days after your surgery.  After showering, lightly dab your incision dry. Do not take a tub bath or go swimming for 3 weeks, or until approved by your doctor at your follow-up appointment.  If you smoke, we strongly recommend that you quit.  Smoking has been proven to interfere with normal healing in your back and will dramatically reduce the success rate of your surgery. Please contact [facility] (800-QUIT-NOW) and use the resources at www.[facility].com for  assistance in stopping smoking.  Surgical Incision   If you have a dressing on your incision, you may remove it three days after your surgery. Keep your incision area clean and dry.  Your incision was closed with Dermabond glue. The glue should begin to peel away within about a week.  Diet            You may return to your usual diet. Be sure to stay hydrated.  When to Contact us  Although your surgery and recovery will likely be uneventful, you may have some residual numbness, aches, and pains in your back and/or legs. This is normal and should improve in the next few weeks.  However, should you experience any of the following, contact us immediately: New numbness or weakness Pain that is progressively getting worse, and is not relieved by your pain medications or rest Bleeding, redness, swelling, pain, or drainage from surgical incision Chills or flu-like symptoms Fever greater than 101.0 F (38.3 C) Problems with bowel or bladder functions Difficulty breathing or shortness of breath Warmth, tenderness, or swelling in your calf  Contact Information During office hours (Monday-Friday 9 am to 5 pm), please call your physician at [phone] and ask for Sharlot Gowda After hours and weekends, please call [phone] and speak with the neurosurgeon on call For a life-threatening emergency, call 911

## 2022-10-03 NOTE — Plan of Care (Signed)
  Problem: Education: Goal: Ability to describe self-care measures that may prevent or decrease complications (Diabetes Survival Skills Education) will improve Outcome: Progressing   Problem: Coping: Goal: Ability to adjust to condition or change in health will improve Outcome: Progressing   Problem: Health Behavior/Discharge Planning: Goal: Ability to manage health-related needs will improve Outcome: Progressing   Problem: Nutritional: Goal: Maintenance of adequate nutrition will improve Outcome: Progressing   Problem: Skin Integrity: Goal: Risk for impaired skin integrity will decrease Outcome: Progressing   Problem: Clinical Measurements: Goal: Ability to maintain clinical measurements within normal limits will improve Outcome: Progressing Goal: Postoperative complications will be avoided or minimized Outcome: Progressing   Problem: Pain Management: Goal: Pain level will decrease Outcome: Progressing   Problem: Skin Integrity: Goal: Will show signs of wound healing Outcome: Progressing   Problem: Education: Goal: Knowledge of General Education information will improve Description: Including pain rating scale, medication(s)/side effects and non-pharmacologic comfort measures Outcome: Progressing   Problem: Clinical Measurements: Goal: Will remain free from infection Outcome: Progressing   Problem: Pain Managment: Goal: General experience of comfort will improve Outcome: Progressing   Problem: Safety: Goal: Ability to remain free from injury will improve Outcome: Progressing

## 2022-10-03 NOTE — Evaluation (Signed)
Occupational Therapy Evaluation Patient Details Name: Julie Wall MRN: [id number] DOB: 07/16/1955 Today's Date: 10/03/2022   History of Present Illness Julie Wall is a 67 y.o female with a history of thoracic stenosis and myelopathy s/p T11-12 decompression and fusion. Pt is POD 1 at time of evaluation.   Clinical Impression   Pt seen for OT evaluation this date, POD#1 from thoracic fusion. Prior to hospital admission, pt was independent with mobility, ADL, and IADL. No falls in past 6 months. However, has been having increased difficulty with all aspects of mobility and ADL due to back pain. Pt in recliner and preparing to transfer to [facility] with nurse tech. OT stepped in. Pt very pain limited. She required MAX  A +2 for safety with MAX VC for hand placement, anterior weight shift (posterior lean and back extension due to pain), and to keep her eyes open while moving particularly given impaired balance due to pain. Once standing, pt required MOD A +2 safety for step pivot to recliner again with MAX VC for sequencing/balance/keeping eyes open. With set up, pt able to complete pericare in standing with UE support on RW and no overt LOB. Pt provided with handout reviewing back precautions with handout provided, self care skills, bed mobility and functional transfer training, AE/DME for bathing, dressing, and toileting needs, and home/routines modifications and falls prevention strategies. Will review next session. Pt endorsing 10/10 pain once back in recliner. RN aware. Pt will benefit from additional skilled OT services to maximize return to PLOF.    Recommendations for follow up therapy are one component of a multi-disciplinary discharge planning process, led by the attending physician.  Recommendations may be updated based on patient status, additional functional criteria and insurance authorization.   Assistance Recommended at Discharge Frequent or constant Supervision/Assistance  Patient can return  home with the following Two people to help with walking and/or transfers;A lot of help with bathing/dressing/bathroom;Assist for transportation;Assistance with cooking/housework;Help with stairs or ramp for entrance    Functional Status Assessment  Patient has had a recent decline in their functional status and demonstrates the ability to make significant improvements in function in a reasonable and predictable amount of time.  Equipment Recommendations  BSC/3in1;Other (comment) (reacher, handheld shower head)    Recommendations for Other Services       Precautions / Restrictions Precautions Precautions: Back Precaution Booklet Issued: Yes (comment) Precaution Comments: handout in room at time of evaluation; educated and reviewed with patient to ensure understanding Restrictions Weight Bearing Restrictions: No      Mobility Bed Mobility               General bed mobility comments: NT, in recliner at start and end of OT session    Transfers Overall transfer level: Needs assistance Equipment used: Rolling walker (2 wheels) Transfers: Sit to/from Stand, Bed to chair/wheelchair/BSC   Stand pivot transfers: Max assist, +2 safety/equipment   Step pivot transfers: Mod assist, +2 safety/equipment     General transfer comment: VC to keep eyes open, VC sequencing, pain limited      Balance Overall balance assessment: Needs assistance Sitting-balance support: Feet supported, Bilateral upper extremity supported Sitting balance-Leahy Scale: Poor   Postural control: Posterior lean Standing balance support: Bilateral upper extremity supported, Reliant on assistive device for balance, During functional activity Standing balance-Leahy Scale: Poor                             ADL  either performed or assessed with clinical judgement   ADL                                         General ADL Comments: Pt currently requires MAX A for LB bathing and  dressing, MOD A for step pivot to BSC, set up and CGA for standing pericare, and MIN A for UB bathing and dressing from seated position.     Vision         Perception     Praxis      Pertinent Vitals/Pain Pain Assessment Pain Assessment: 0-10 Pain Score: 10-Worst pain ever Pain Location: Back Pain Descriptors / Indicators: Aching, Sharp Pain Intervention(s): Limited activity within patient's tolerance, Monitored during session, Repositioned, Premedicated before session, Patient requesting pain meds-RN notified     Hand Dominance     Extremity/Trunk Assessment Upper Extremity Assessment Upper Extremity Assessment: Generalized weakness   Lower Extremity Assessment Lower Extremity Assessment: Generalized weakness;Defer to PT evaluation RLE Deficits / Details: increased weakness on RLE > LLE RLE Sensation:  (reports of numbness)   Cervical / Trunk Assessment Cervical / Trunk Assessment: Back Surgery   Communication Communication Communication: No difficulties   Cognition Arousal/Alertness: Awake/alert Behavior During Therapy: WFL for tasks assessed/performed Overall Cognitive Status: Within Functional Limits for tasks assessed                                       General Comments       Exercises Other Exercises Other Exercises: Pt instructed in strategies to promote pain control with ADL/mobility   Shoulder Instructions      Home Living Family/patient expects to be discharged to:: Private residence Living Arrangements: Children Available Help at Discharge: Family Type of Home: House Home Access: Stairs to enter Secretary/administrator of Steps: 3 Entrance Stairs-Rails: Can reach both Home Layout: One level     Bathroom Shower/Tub: Chief Strategy Officer: Standard     Home Equipment: Rollator (4 wheels);Shower seat   Additional Comments: per daughter reports has rollator but was not using prior to admission      Prior  Functioning/Environment Prior Level of Function : Independent/Modified Independent             Mobility Comments: was not using AD prior to admission, but patient reports some weakness in BLE and buckling leading to multiple falls          OT Problem List: Decreased strength;Pain;Decreased range of motion;Decreased safety awareness;Impaired balance (sitting and/or standing);Decreased knowledge of use of DME or AE;Decreased knowledge of precautions      OT Treatment/Interventions: Self-care/ADL training;Therapeutic exercise;Therapeutic activities;DME and/or AE instruction;Patient/family education;Balance training    OT Goals(Current goals can be found in the care plan section) Acute Rehab OT Goals Patient Stated Goal: have less pain OT Goal Formulation: With patient/family Time For Goal Achievement: 10/17/22 Potential to Achieve Goals: Good ADL Goals Pt Will Perform Upper Body Dressing: sitting;with supervision Pt Will Perform Lower Body Dressing: with mod assist;sit to/from stand Pt Will Transfer to Toilet: with min assist;stand pivot transfer;bedside commode (LRAD) Pt Will Perform Toileting - Clothing Manipulation and hygiene: with modified independence;sit to/from stand Additional ADL Goal #1: Pt will verbalize at least 3 learned strategies to promote safety/indep with ADL during recovery.  OT Frequency: Min 3X/week  Co-evaluation              AM-PAC OT "6 Clicks" Daily Activity     Outcome Measure Help from another person eating meals?: None Help from another person taking care of personal grooming?: A Little Help from another person toileting, which includes using toliet, bedpan, or urinal?: A Lot Help from another person bathing (including washing, rinsing, drying)?: A Lot Help from another person to put on and taking off regular upper body clothing?: A Lot Help from another person to put on and taking off regular lower body clothing?: A Lot 6 Click Score: 15    End of Session Equipment Utilized During Treatment: Rolling walker (2 wheels) Nurse Communication: Mobility status;Patient requests pain meds  Activity Tolerance: Patient limited by pain Patient left: in chair;with call bell/phone within reach;with chair alarm set;with family/visitor present  OT Visit Diagnosis: Other abnormalities of gait and mobility (R26.89);Pain Pain - part of body:  (back)                Time: 1610-9604 OT Time Calculation (min): 16 min Charges:  OT General Charges $OT Visit: 1 Visit OT Evaluation $OT Eval Moderate Complexity: 1 Mod OT Treatments $Self Care/Home Management : 8-22 mins  Arman Filter., MPH, MS, OTR/L ascom [phone] 10/03/22, 1:27 PM

## 2022-10-03 NOTE — Progress Notes (Signed)
Physical Therapy Evaluation Patient Details Name: Julie Wall MRN: [id number] DOB: 16-Mar-1956 Today's Date: 10/03/2022  History of Present Illness  Julie Wall is a 67 y.o female with a history of thoracic stenosis and myelopathy s/p T11-12 decompression and fusion. Pt is POD 1 at time of evaluation.   Clinical Impression  Patient is a pleasant 67 y.o. female admitted, s/p T11-12 decompression and fusion. PT educating on spinal precautions prior to bed mobility. Patient currently requires Max A for bed mobility with cues for log roll required. Pt was Max A +2 for stand pivot transfer from wheelchair to recliner due to pain/weakness. Significant pain reported with transitions. Vitals stable with patient and weaned off oxygen due to stable Sp02 97-98% with mobility. Patient demonstrating deficits including muscle weakness, pain, decreased activity tolerance, and decreased mobility. Pt will benefit from acute skilled PT services while admitted to address impairments. RN notified of vitals throughout session, pain level and mobility status at end of session. Patient left in recliner with chair alarm and all needs met.      Recommendations for follow up therapy are one component of a multi-disciplinary discharge planning process, led by the attending physician.  Recommendations may be updated based on patient status, additional functional criteria and insurance authorization.  Follow Up Recommendations Can patient physically be transported by private vehicle: No     Assistance Recommended at Discharge Frequent or constant Supervision/Assistance  Patient can return home with the following  Two people to help with walking and/or transfers;Assist for transportation;Help with stairs or ramp for entrance;Two people to help with bathing/dressing/bathroom    Equipment Recommendations Other (comment) (to be determined at next venue of care)  Recommendations for Other Services       Functional Status  Assessment Patient has had a recent decline in their functional status and demonstrates the ability to make significant improvements in function in a reasonable and predictable amount of time.     Precautions / Restrictions Precautions Precautions: Back Precaution Booklet Issued: No Precaution Comments: handout in room at time of evaluation; educated and reviewed with patient to ensure understanding Restrictions Weight Bearing Restrictions: No      Mobility  Bed Mobility Overal bed mobility: Needs Assistance Bed Mobility: Rolling, Sidelying to Sit Rolling: Mod assist Sidelying to sit: Max assist       General bed mobility comments: Mod A required for rolling with cues for hand placement and education on log roll. With R sidelying to sit, Max A required for trunk and Julie management. Significant pain upon upright seated position with frequent posterior lean    Transfers Overall transfer level: Needs assistance Equipment used: Rolling walker (2 wheels) Transfers: Sit to/from Stand, Bed to chair/wheelchair/BSC Sit to Stand: Max assist, +2 physical assistance (cues to push through Julie and hand placement.) Stand pivot transfers: Max assist, +2 physical assistance         General transfer comment: Stand pivot from bed > recliner with use RW Max A +2. Mild lightheadedness with standing activities, vitals stable. Weened off oxygen, with Sp02 stable at 97-98%. No SOB noted.    Ambulation/Gait               General Gait Details: gait deferred this date due to pain  Stairs            Wheelchair Mobility    Modified Rankin (Stroke Patients Only)       Balance Overall balance assessment: Needs assistance Sitting-balance support: Feet supported, Bilateral upper extremity supported  Sitting balance-Leahy Scale: Poor Sitting balance - Comments: frequent posterior lean 2/2 pain; cues and assistance required for positioning Postural control: Posterior lean Standing  balance support: Bilateral upper extremity supported, Reliant on assistive device for balance, During functional activity Standing balance-Leahy Scale: Fair Standing balance comment: improved posture in standing due to pain relief with this position                             Pertinent Vitals/Pain Pain Assessment Pain Assessment: 0-10 Pain Score: 9  Pain Location: Back Pain Descriptors / Indicators: Aching, Sharp Pain Intervention(s): Patient requesting pain meds-RN notified    Home Living Family/patient expects to be discharged to:: Private residence Living Arrangements: Children Available Help at Discharge: Family Type of Home: House Home Access: Stairs to enter Entrance Stairs-Rails: Can reach both Entrance Stairs-Number of Steps: 3   Home Layout: One level Home Equipment: Rollator (4 wheels) Additional Comments: per daughter reports has rollator but was not using prior to admission    Prior Function Prior Level of Function : Independent/Modified Independent             Mobility Comments: was not using AD prior to admission, but patient reports some weakness in BLE and buckling leading to multiple falls       Hand Dominance        Extremity/Trunk Assessment        Lower Extremity Assessment Lower Extremity Assessment: Generalized weakness;RLE deficits/detail RLE Deficits / Details: increased weakness on RLE > LLE RLE Sensation:  (reports of numbness)       Communication      Cognition Arousal/Alertness: Awake/alert Behavior During Therapy: WFL for tasks assessed/performed Overall Cognitive Status: Within Functional Limits for tasks assessed                                          General Comments      Exercises Other Exercises Other Exercises: Ankle Pumps bilaterally x 5 reps, educated on importaqnce of completion. Education on importance of OOB activity. Pt agreeable.   Assessment/Plan    PT Assessment Patient  needs continued PT services  PT Problem List Decreased strength;Decreased activity tolerance;Decreased balance;Decreased mobility;Pain;Decreased knowledge of precautions       PT Treatment Interventions DME instruction    PT Goals (Current goals can be found in the Care Plan section)  Acute Rehab PT Goals Patient Stated Goal: "get home" PT Goal Formulation: With patient Time For Goal Achievement: 10/17/22 Potential to Achieve Goals: Fair    Frequency 7X/week     Co-evaluation               AM-PAC PT "6 Clicks" Mobility  Outcome Measure Help needed turning from your back to your side while in a flat bed without using bedrails?: A Little Help needed moving from lying on your back to sitting on the side of a flat bed without using bedrails?: A Lot Help needed moving to and from a bed to a chair (including a wheelchair)?: A Lot Help needed standing up from a chair using your arms (e.g., wheelchair or bedside chair)?: A Lot Help needed to walk in hospital room?: Total Help needed climbing 3-5 steps with a railing? : Total 6 Click Score: 11    End of Session Equipment Utilized During Treatment: Gait belt Activity Tolerance: Patient limited by pain  Patient left: in chair;with chair alarm set Nurse Communication: Mobility status;Patient requests pain meds PT Visit Diagnosis: Other abnormalities of gait and mobility (R26.89);Muscle weakness (generalized) (M62.81);Difficulty in walking, not elsewhere classified (R26.2)    Time: 1610-9604 PT Time Calculation (min) (ACUTE ONLY): 26 min   Charges:   PT Evaluation $PT Eval Moderate Complexity: 1 [address] [place], PT, DPT 10/03/22 11:40 AM

## 2022-10-03 NOTE — Progress Notes (Signed)
    Attending Progress Note  History: Julie Wall is a 67 y.o female s/p T11-12 decompression and fusion  POD1: expected back pain this morning  Physical Exam: Vitals:   10/03/22 0606 10/03/22 0756  BP: (!) 103/47 (!) 103/58  Pulse: 70 82  Resp:  19  Temp:  97.9 F (36.6 C)  SpO2:  100%    AA Ox3 CNI  Strength:5/5 throughout BLE   Incision wound vac in place HV output 80 since surgery  Data:  Other tests/results: none   Assessment/Plan:  Julie Wall is a 67 y.o with a history of thoracic stenosis and myelopathy s/p T11-12 decompression and fusion  - mobilize - pain control - DVT prophylaxis - PTOT - will continue to monitor HV output  Manning Charity PA-C Department of Neurosurgery

## 2022-10-04 LAB — GLUCOSE, CAPILLARY
Glucose-Capillary: 155 mg/dL — ABNORMAL HIGH (ref 70–99)
Glucose-Capillary: 163 mg/dL — ABNORMAL HIGH (ref 70–99)
Glucose-Capillary: 108 mg/dL — ABNORMAL HIGH (ref 70–99)
Glucose-Capillary: 229 mg/dL — ABNORMAL HIGH (ref 70–99)

## 2022-10-04 NOTE — [place] FL2 (Signed)
[place] MEDICAID FL2 LEVEL OF CARE FORM     IDENTIFICATION  Patient Name: Julie Wall Birthdate: August 05, 1955 Sex: female Admission Date (Current Location): 10/02/2022  [place] and [place] Number:  [address] [id number] [facility] Facility and Address:  [facility], [address] [place], [place] [address]      Provider Number: [id number]  Attending Physician Name and Address:  Venetia Night, MD  Relative Name and Phone Number:  Haywood Pao    Current Level of Care: Hospital Recommended Level of Care: Skilled Nursing Facility Prior Approval Number:    Date Approved/Denied:   PASRR Number: [id number]  Discharge Plan: SNF    Current Diagnoses: Patient Active Problem List   Diagnosis Date Noted   Thoracic spondylosis with myelopathy 10/02/2022   Spinal stenosis of thoracic region 10/02/2022   S/P spinal fusion 10/02/2022   B12 deficiency 09/27/2021   Hypertension associated with diabetes (HCC) 09/27/2021   Diabetic polyneuropathy associated with type 2 diabetes mellitus (HCC)    Hilar lymphadenopathy 02/05/2020   History of COVID-19 02/05/2020   Atherosclerosis of aorta (HCC) 05/04/2017   Pap smear abnormality of cervix with ASCUS favoring benign 04/23/2017   Vitamin D deficiency 12/14/2016   Obesity (BMI 30.0-34.9) 12/13/2015   Chronic bronchitis (HCC) 05/14/2015   Diabetes mellitus (HCC) 05/14/2015   Positive H. pylori test 04/05/2015   Rapid urease test for Helicobacter pylori infection positive 04/05/2015   Umbilical hernia 04/01/2015   Exomphalos 04/01/2015   RLS (restless legs syndrome) 01/08/2015   Type 2 diabetes mellitus with hyperlipidemia (HCC) 01/08/2015   Neurogenic claudication 01/08/2015   AD (atopic dermatitis) 12/28/2014   Chronic LBP 12/28/2014   Dyslipidemia 12/28/2014   Gastroesophageal reflux disease without esophagitis 12/28/2014   Microalbuminuria 12/28/2014   Disorder of bone and cartilage 12/28/2014   Neuralgia of  left thigh 12/28/2014   Perennial allergic rhinitis with seasonal variation 12/28/2014   Tobacco abuse 12/28/2014   Mononeuropathy of left lower extremity 12/28/2014   Hypertension, benign 09/22/2005    Orientation RESPIRATION BLADDER Height & Weight     Self, Time, Situation, Place  Normal Continent Weight: 73 kg Height:  4\' 11"  (149.9 cm)  BEHAVIORAL SYMPTOMS/MOOD NEUROLOGICAL BOWEL NUTRITION STATUS      Continent  (See Discharge Summary)  AMBULATORY STATUS COMMUNICATION OF NEEDS Skin   Extensive Assist Verbally Normal                       Personal Care Assistance Level of Assistance  Bathing, Dressing Bathing Assistance: Maximum assistance   Dressing Assistance: Limited assistance     Functional Limitations Info  Sight, Hearing, Speech Sight Info: Adequate Hearing Info: Adequate Speech Info: Adequate    SPECIAL CARE FACTORS FREQUENCY  PT (By licensed PT), OT (By licensed OT)     PT Frequency: 5x weekly OT Frequency: 5x weekly            Contractures Contractures Info: Not present    Additional Factors Info  Code Status, Allergies Code Status Info: Full Code Allergies Info: Penicillins           Current Medications (10/04/2022):  This is the current hospital active medication list Current Facility-Administered Medications  Medication Dose Route Frequency Provider Last Rate Last Admin   0.9 %  sodium chloride infusion  250 mL Intravenous Continuous Susanne Borders, PA       0.9 %  sodium chloride infusion   Intravenous Continuous Susanne Borders, PA 50 mL/hr at 10/02/22  1733 New Bag at 10/02/22 1733   acetaminophen (TYLENOL) tablet 1,000 mg  1,000 mg Oral Q6H Susanne Borders, PA   1,000 mg at 10/04/22 1221   albuterol (PROVENTIL) (2.5 MG/3ML) 0.083% nebulizer solution 3 mL  3 mL Nebulization Q6H PRN Susanne Borders, PA       atorvastatin (LIPITOR) tablet 40 mg  40 mg Oral Daily Susanne Borders, [place]   40 mg at 10/04/22 1610   bisacodyl  (DULCOLAX) suppository 10 mg  10 mg Rectal Daily PRN Susanne Borders, PA       cyanocobalamin (VITAMIN B12) tablet 1,000 mcg  1,000 mcg Oral Daily Susanne Borders, PA   1,000 mcg at 10/04/22 9604   docusate sodium (COLACE) capsule 100 mg  100 mg Oral BID Susanne Borders, PA   100 mg at 10/04/22 0853   enoxaparin (LOVENOX) injection 40 mg  40 mg Subcutaneous Q24H Susanne Borders, [place]   40 mg at 10/04/22 0904   fluticasone furoate-vilanterol (BREO ELLIPTA) 200-25 MCG/ACT 1 puff  1 puff Inhalation Daily Susanne Borders, PA   1 puff at 10/04/22 5409   And   umeclidinium bromide (INCRUSE ELLIPTA) 62.5 MCG/ACT 1 puff  1 puff Inhalation Daily Susanne Borders, PA   1 puff at 10/04/22 8119   folic acid (FOLVITE) tablet 1 mg  1 mg Oral Daily Susanne Borders, PA   1 mg at 10/04/22 1478   gabapentin (NEURONTIN) capsule 300 mg  300 mg Oral BID Susanne Borders, PA   300 mg at 10/04/22 2956   HYDROmorphone (DILAUDID) injection 0.5 mg  0.5 mg Intravenous Q2H PRN Susanne Borders, PA   0.5 mg at 10/02/22 1740   insulin aspart (novoLOG) injection 0-15 Units  0-15 Units Subcutaneous TID WC Susanne Borders, [place]   3 Units at 10/04/22 1229   insulin aspart (novoLOG) injection 0-5 Units  0-5 Units Subcutaneous QHS Susanne Borders, [place]   2 Units at 10/02/22 2206   magnesium citrate solution 1 Bottle  1 Bottle Oral Once PRN Susanne Borders, PA       menthol-cetylpyridinium (CEPACOL) lozenge 3 mg  1 lozenge Oral PRN Susanne Borders, PA       Or   phenol (CHLORASEPTIC) mouth spray 1 spray  1 spray Mouth/Throat PRN Susanne Borders, PA       methocarbamol (ROBAXIN) tablet 500 mg  500 mg Oral Q6H PRN Susanne Borders, PA   500 mg at 10/03/22 2130   Or   methocarbamol (ROBAXIN) 500 mg in dextrose 5 % 50 mL IVPB  500 mg Intravenous Q6H PRN Susanne Borders, PA   Stopped at 10/02/22 1614   ondansetron (ZOFRAN) tablet 4 mg  4 mg Oral Q6H PRN Susanne Borders, PA       Or   ondansetron  [facility]) injection 4 mg  4 mg Intravenous Q6H PRN Susanne Borders, PA       oxyCODONE (Oxy IR/ROXICODONE) immediate release tablet 10 mg  10 mg Oral Q3H PRN Susanne Borders, PA   10 mg at 10/04/22 1221   oxyCODONE (Oxy IR/ROXICODONE) immediate release tablet 5 mg  5 mg Oral Q3H PRN Susanne Borders, PA   5 mg at 10/02/22 2200   polyethylene glycol (MIRALAX / GLYCOLAX) packet 17 g  17 g Oral Daily PRN Susanne Borders, PA   17 g at 10/04/22 0533   senna (SENOKOT) tablet 8.6 mg  1 tablet Oral BID Susanne Borders, PA   8.6 mg at 10/04/22 5409   sodium chloride flush (NS) 0.9 % injection 3 mL  3 mL Intravenous Q12H Susanne Borders, [place]   3 mL at 10/04/22 8119   sodium chloride flush (NS) 0.9 % injection 3 mL  3 mL Intravenous PRN Susanne Borders, PA         Discharge Medications: Please see discharge summary for a list of discharge medications.  Relevant Imaging Results:  Relevant Lab Results:   Additional Information [id number]  Garret Reddish, RN

## 2022-10-04 NOTE — TOC Initial Note (Signed)
Transition of Care [facility]) - Initial/Assessment Note    Patient Details  Name: Julie Wall MRN: [id number] Date of Birth: 1956/02/26  Transition of Care [facility]) CM/SW Contact:    Garret Reddish, RN Phone Number: 10/04/2022, 2:19 PM  Clinical Narrative:      I have meet with patient at bedside.  Patient reports that prior to admission she lived at home with her daughter.  She reports that she was able to bath and dress herself prior to admission.  Patient reports that she does have a PCP Dr. Carlynn Purl.  Patient reports that she use [facility] to receive prescription medications.    Patient reports that she would like to receive short term rehab prior to going home.  I have informed patient that I will completed SNF workup.  I have also informed the patient her insurance company will also have to approve a short-term rehab.    Patient has given me permission to complete bed search to [facility] facilities.  [facility] preference is [facility] if [facility] are able to accept her.    Fl 2 completed and  PASSR obtained.  I have completed bed search to [facility] facilities.   TOC will continue to follow for discharge planning.                  Expected Discharge Plan: Skilled Nursing Facility Barriers to Discharge: No Barriers Identified   Patient Goals and CMS Choice     Choice offered to / list presented to : Patient      Expected Discharge Plan and Services   Discharge Planning Services: CM Consult Post Acute Care Choice:  (Patient would like to discharge to [facility] on discharge.  Will completed a bed search.) Living arrangements for the past 2 months: Single Family Home                                      Prior Living Arrangements/Services Living arrangements for the past 2 months: Single Family Home Lives with:: Adult Children Patient language and need for interpreter reviewed:: Yes Do you feel safe going back to the place where you live?: Yes       Need for Family Participation in Patient Care: Yes (Comment) Care giver support system in place?: Yes (comment) (Patient has supportive daughters.) Current home services: DME (Patient has a manual wheelchair.)    Activities of Daily Living Home Assistive Devices/Equipment: Cane (specify quad or straight), Walker (specify type) ADL Screening (condition at time of admission) Patient's cognitive ability adequate to safely complete daily activities?: Yes Is the patient deaf or have difficulty hearing?: No Does the patient have difficulty seeing, even when wearing glasses/contacts?: Yes Does the patient have difficulty concentrating, remembering, or making decisions?: No Patient able to express need for assistance with ADLs?: Yes Does the patient have difficulty dressing or bathing?: No Independently performs ADLs?: Yes (appropriate for developmental age) Does the patient have difficulty walking or climbing stairs?: Yes Weakness of Legs: Right Weakness of Arms/Hands: None  Permission Sought/Granted   Permission granted to share information with : Yes, Verbal Permission Granted              Emotional Assessment Appearance:: Appears stated age Attitude/Demeanor/Rapport: Engaged Affect (typically observed): Appropriate Orientation: : Oriented to Self, Oriented to Place, Oriented to  Time, Oriented to Situation      Admission diagnosis:  S/P spinal fusion [  Z98.1] Patient Active Problem List   Diagnosis Date Noted   Thoracic spondylosis with myelopathy 10/02/2022   Spinal stenosis of thoracic region 10/02/2022   S/P spinal fusion 10/02/2022   B12 deficiency 09/27/2021   Hypertension associated with diabetes (HCC) 09/27/2021   Diabetic polyneuropathy associated with type 2 diabetes mellitus (HCC)    Hilar lymphadenopathy 02/05/2020   History of COVID-19 02/05/2020   Atherosclerosis of aorta (HCC) 05/04/2017   Pap smear abnormality of cervix with ASCUS favoring benign  04/23/2017   Vitamin D deficiency 12/14/2016   Obesity (BMI 30.0-34.9) 12/13/2015   Chronic bronchitis (HCC) 05/14/2015   Diabetes mellitus (HCC) 05/14/2015   Positive H. pylori test 04/05/2015   Rapid urease test for Helicobacter pylori infection positive 04/05/2015   Umbilical hernia 04/01/2015   Exomphalos 04/01/2015   RLS (restless legs syndrome) 01/08/2015   Type 2 diabetes mellitus with hyperlipidemia (HCC) 01/08/2015   Neurogenic claudication 01/08/2015   AD (atopic dermatitis) 12/28/2014   Chronic LBP 12/28/2014   Dyslipidemia 12/28/2014   Gastroesophageal reflux disease without esophagitis 12/28/2014   Microalbuminuria 12/28/2014   Disorder of bone and cartilage 12/28/2014   Neuralgia of left thigh 12/28/2014   Perennial allergic rhinitis with seasonal variation 12/28/2014   Tobacco abuse 12/28/2014   Mononeuropathy of left lower extremity 12/28/2014   Hypertension, benign 09/22/2005   PCP:  Alba Cory, MD Pharmacy:   [facility] - [place], [place] - [address] [place] [address] Phone: [phone] Fax: [phone]  [facility], [facility] - [place], [place] - [address] [place], [address] [place] [address] Phone: [phone] Fax: [phone]     Social Determinants of Health (SDOH) Social History: SDOH Screenings   Food Insecurity: No Food Insecurity (10/02/2022)  Housing: Low Risk  (10/02/2022)  Transportation Needs: No Transportation Needs (10/02/2022)  Utilities: Not At Risk (10/02/2022)  Alcohol Screen: Low Risk  (07/20/2022)  Depression (PHQ2-9): Low Risk  (09/18/2022)  Financial Resource Strain: Low Risk  (07/20/2022)  Physical Activity: Sufficiently Active (07/20/2022)  Recent Concern: Physical Activity - Insufficiently Active (05/25/2022)  Social Connections: Moderately Isolated (07/20/2022)  Stress: No Stress Concern Present (07/20/2022)  Tobacco Use: High Risk  (10/03/2022)   SDOH Interventions:     Readmission Risk Interventions     No data to display

## 2022-10-04 NOTE — Progress Notes (Addendum)
Physical Therapy Treatment Patient Details Name: Julie Wall MRN: [id number] DOB: Nov 30, 1955 Today's Date: 10/04/2022   History of Present Illness Julie Wall is a 67 y.o female with a history of thoracic stenosis and myelopathy s/p T11-12 decompression and fusion. Pt is POD 1 at time of evaluation.    PT Comments    Pt lying in bed on entry reports tylenol is helping, but she remains 9/10 for pain. Assisted with ROM of legs in bed, then transition to EOB with maxA. Pain controlled while seated EOB x5 minutes, but much worse after AMB at bedside. Pt reports progressive pain and numbness in RLE while in standing/trying to walk, heavily dependent on BUE for safe mobility. Author notes some apparent Rt foot drop while readjusting in standing, but did not have a chance to look further given other pressing concerns. Pt tolerates multiple STS transfers this date, requires elevated surface height and use of YRW, but technique and pain control improve each time. Pt unable to do much more after AMB a few feet given severe pain rise. Pt left up in chair at end of session, RN aware of need for PRN analgesia.    Recommendations for follow up therapy are one component of a multi-disciplinary discharge planning process, led by the attending physician.  Recommendations may be updated based on patient status, additional functional criteria and insurance authorization.  Follow Up Recommendations  Can patient physically be transported by private vehicle: No    Assistance Recommended at Discharge Frequent or constant Supervision/Assistance  Patient can return home with the following Two people to help with walking and/or transfers;Assist for transportation;Help with stairs or ramp for entrance;Two people to help with bathing/dressing/bathroom   Equipment Recommendations  Other (comment)    Recommendations for Other Services       Precautions / Restrictions Precautions Precautions: Back Precaution Booklet  Issued: Yes (comment) Precaution Comments: handout in room at time of evaluation; educated and reviewed with patient to ensure understanding Restrictions Weight Bearing Restrictions: No     Mobility  Bed Mobility Overal bed mobility: Needs Assistance Bed Mobility: Rolling, Sidelying to Sit Rolling: Min assist Sidelying to sit: Max assist            Transfers Overall transfer level: Needs assistance Equipment used:  (YRW) Transfers: Sit to/from Stand Sit to Stand: From elevated surface, Min guard   Step pivot transfers: Min guard       General transfer comment: closely watching Rt knee durign step pivot due to c/o worsening symptoms    Ambulation/Gait Ambulation/Gait assistance: Min guard Gait Distance (Feet): 6 Feet (additional distance limited by progressive pain and 'numbness' in rt leg) Assistive device:  (YRW) Gait Pattern/deviations: Step-to pattern       General Gait Details: encouraged small steps given Rt knee instability.   Stairs             Wheelchair Mobility    Modified Rankin (Stroke Patients Only)       Balance                                            Cognition Arousal/Alertness: Awake/alert Behavior During Therapy: WFL for tasks assessed/performed Overall Cognitive Status: Within Functional Limits for tasks assessed  Exercises General Exercises - Lower Extremity Heel Slides: AAROM, Both, 10 reps, Supine, Limitations Heel Slides Limitations: more pain limited and restricted in range on LLE    General Comments        Pertinent Vitals/Pain Pain Assessment Pain Assessment: 0-10 Pain Score: 9  Pain Location: Back; Rt thigh knee Pain Descriptors / Indicators: Aching, Sharp Pain Intervention(s): Limited activity within patient's tolerance, Monitored during session, Premedicated before session    Home Living                           Prior Function            PT Goals (current goals can now be found in the care plan section) Acute Rehab PT Goals Patient Stated Goal: "get home" PT Goal Formulation: With patient Time For Goal Achievement: 10/17/22 Potential to Achieve Goals: Fair Progress towards PT goals: Progressing toward goals    Frequency    7X/week      PT Plan Current plan remains appropriate    Co-evaluation              AM-PAC PT "6 Clicks" Mobility   Outcome Measure  Help needed turning from your back to your side while in a flat bed without using bedrails?: A Little Help needed moving from lying on your back to sitting on the side of a flat bed without using bedrails?: A Lot Help needed moving to and from a bed to a chair (including a wheelchair)?: A Lot Help needed standing up from a chair using your arms (e.g., wheelchair or bedside chair)?: A Lot Help needed to walk in hospital room?: Total Help needed climbing 3-5 steps with a railing? : Total 6 Click Score: 11    End of Session   Activity Tolerance: Patient limited by pain Patient left: in chair;with chair alarm set;with family/visitor present;with call bell/phone within reach Nurse Communication: Mobility status;Patient requests pain meds PT Visit Diagnosis: Other abnormalities of gait and mobility (R26.89);Muscle weakness (generalized) (M62.81);Difficulty in walking, not elsewhere classified (R26.2)     Time: 2130-8657 PT Time Calculation (min) (ACUTE ONLY): 28 min  Charges:  $Gait Training: 8-22 mins $Therapeutic Activity: 8-22 mins                    12:44 PM, 10/04/22 Rosamaria Lints, PT, DPT Physical Therapist - [facility]  [phone] (ASCOM)    Sita Mangen C 10/04/2022, 12:44 PM

## 2022-10-04 NOTE — Plan of Care (Addendum)
Patient A&Ox4, from home, up with assist and walker in room. Patient has been using bedside commode. Patient's temp was 100.5 around midnight, Tylenol given bringing fever down to 99.4. upon this RN re-assessment of patient she was sleeping with witnessed chest rise and fall. Patient c/o abdominal pain/constipation. Patient was given warmed prune juice, water and advised patient to bend knees towards chest alternating legs and to ambulate more to help facilitate bowel movement. Patient verbalized understanding and agreeability to plan.

## 2022-10-04 NOTE — Plan of Care (Signed)
  Problem: Education: Goal: Ability to describe self-care measures that may prevent or decrease complications (Diabetes Survival Skills Education) will improve Outcome: Progressing Goal: Individualized Educational Video(s) Outcome: Progressing   Problem: Coping: Goal: Ability to adjust to condition or change in health will improve Outcome: Progressing   Problem: Fluid Volume: Goal: Ability to maintain a balanced intake and output will improve Outcome: Progressing   Problem: Health Behavior/Discharge Planning: Goal: Ability to identify and utilize available resources and services will improve Outcome: Progressing Goal: Ability to manage health-related needs will improve Outcome: Progressing   Problem: Metabolic: Goal: Ability to maintain appropriate glucose levels will improve Outcome: Progressing   Problem: Nutritional: Goal: Maintenance of adequate nutrition will improve Outcome: Progressing Goal: Progress toward achieving an optimal weight will improve Outcome: Progressing   Problem: Skin Integrity: Goal: Risk for impaired skin integrity will decrease Outcome: Progressing   Problem: Tissue Perfusion: Goal: Adequacy of tissue perfusion will improve Outcome: Progressing   Problem: Education: Goal: Ability to verbalize activity precautions or restrictions will improve Outcome: Progressing Goal: Knowledge of the prescribed therapeutic regimen will improve Outcome: Progressing Goal: Understanding of discharge needs will improve Outcome: Progressing   Problem: Activity: Goal: Ability to avoid complications of mobility impairment will improve Outcome: Progressing Goal: Ability to tolerate increased activity will improve Outcome: Progressing Goal: Will remain free from falls Outcome: Progressing   Problem: Bowel/Gastric: Goal: Gastrointestinal status for postoperative course will improve Outcome: Progressing   Problem: Clinical Measurements: Goal: Ability to  maintain clinical measurements within normal limits will improve Outcome: Progressing Goal: Postoperative complications will be avoided or minimized Outcome: Progressing Goal: Diagnostic test results will improve Outcome: Progressing   Problem: Pain Management: Goal: Pain level will decrease Outcome: Progressing   Problem: Skin Integrity: Goal: Will show signs of wound healing Outcome: Progressing   Problem: Health Behavior/Discharge Planning: Goal: Identification of resources available to assist in meeting health care needs will improve Outcome: Progressing   Problem: Bladder/Genitourinary: Goal: Urinary functional status for postoperative course will improve Outcome: Progressing   Problem: Education: Goal: Knowledge of General Education information will improve Description: Including pain rating scale, medication(s)/side effects and non-pharmacologic comfort measures Outcome: Progressing   Problem: Health Behavior/Discharge Planning: Goal: Ability to manage health-related needs will improve Outcome: Progressing   Problem: Clinical Measurements: Goal: Ability to maintain clinical measurements within normal limits will improve Outcome: Progressing Goal: Will remain free from infection Outcome: Progressing Goal: Diagnostic test results will improve Outcome: Progressing Goal: Respiratory complications will improve Outcome: Progressing Goal: Cardiovascular complication will be avoided Outcome: Progressing   Problem: Activity: Goal: Risk for activity intolerance will decrease Outcome: Progressing   Problem: Nutrition: Goal: Adequate nutrition will be maintained Outcome: Progressing   Problem: Coping: Goal: Level of anxiety will decrease Outcome: Progressing   Problem: Elimination: Goal: Will not experience complications related to bowel motility Outcome: Progressing Goal: Will not experience complications related to urinary retention Outcome: Progressing    Problem: Pain Managment: Goal: General experience of comfort will improve Outcome: Progressing   Problem: Safety: Goal: Ability to remain free from injury will improve Outcome: Progressing   Problem: Skin Integrity: Goal: Risk for impaired skin integrity will decrease Outcome: Progressing   

## 2022-10-04 NOTE — Progress Notes (Signed)
    Attending Progress Note  History: Han Lysne is a 67 y.o female s/p T11-12 decompression and fusion  POD2: Improved leg numbness this morning  POD1: expected back pain this morning  Physical Exam: Vitals:   10/04/22 0413 10/04/22 0747  BP: (!) 124/48 103/68  Pulse: (!) 107 96  Resp: 20 20  Temp: 98.6 F (37 C) 98.7 F (37.1 C)  SpO2: 90% 90%    AA Ox3 CNI  Strength:5/5 throughout BLE   Incision wound vac in place HV output not recorded   Data:  Other tests/results: none   Assessment/Plan:  Amberlynn Taff is a 67 y.o with a history of thoracic stenosis and myelopathy s/p T11-12 decompression and fusion  - mobilize - pain control - DVT prophylaxis - PTOT; pt expressed desire for rehab - will continue to monitor HV output- Please record output  Manning Charity PA-C Department of Neurosurgery

## 2022-10-04 NOTE — Progress Notes (Signed)
Occupational Therapy Treatment Patient Details Name: Julie Wall MRN: [id number] DOB: 1955/11/01 Today's Date: 10/04/2022   History of present illness Julie Wall is a 67 y.o female with a history of thoracic stenosis and myelopathy s/p T11-12 decompression and fusion. Pt is POD 1 at time of evaluation.   OT comments  Pt received semi-reclined in bed. Appearing slightly groggy, but alert; willing to work with OT on t/f to EOB and grooming. T/f MOD A with HOB raised and log roll technique; multiple cues; grooming at EOB set up; pt able to stand and take sidesteps with CGA at RW towards [facility] slowly. See flowsheet below for further details of session. Left semi-reclined in bed with all needs in reach.  Patient will benefit from continued OT while in acute care.    Recommendations for follow up therapy are one component of a multi-disciplinary discharge planning process, led by the attending physician.  Recommendations may be updated based on patient status, additional functional criteria and insurance authorization.    Assistance Recommended at Discharge Frequent or constant Supervision/Assistance  Patient can return home with the following  A lot of help with walking and/or transfers;A lot of help with bathing/dressing/bathroom;Assistance with cooking/housework;Direct supervision/assist for medications management;Direct supervision/assist for financial management;Assist for transportation;Help with stairs or ramp for entrance   Equipment Recommendations  BSC/3in1;Other (comment) (reacher, handheld showerhead)    Recommendations for Other Services      Precautions / Restrictions Precautions Precautions: Back Precaution Booklet Issued: Yes (comment) Precaution Comments: handout in room at time of evaluation; educated and reviewed with patient to ensure understanding Restrictions Weight Bearing Restrictions: No       Mobility Bed Mobility Overal bed mobility: Needs Assistance Bed  Mobility: Rolling, Sidelying to Sit, Sit to Supine Rolling: Min assist Sidelying to sit: Mod assist, HOB elevated   Sit to supine: Min assist (pt not using log roll technique or waiting for OT instructions on sit to supine.)   General bed mobility comments: needs cues and encouragement    Transfers Overall transfer level: Needs assistance Equipment used: Rolling walker (2 wheels) Transfers: Sit to/from Stand Sit to Stand: Min guard           General transfer comment: Pt able to take small sidesteps to the L towards HOB.     Balance Overall balance assessment: Needs assistance Sitting-balance support: Feet supported Sitting balance-Leahy Scale: Good     Standing balance support: Bilateral upper extremity supported, Reliant on assistive device for balance, During functional activity Standing balance-Leahy Scale: Poor                             ADL either performed or assessed with clinical judgement   ADL Overall ADL's : Needs assistance/impaired     Grooming: Set up;Sitting;Wash/dry face;Oral care                                 General ADL Comments: Tolerated sitting EOB for approx 8 minutes.    Extremity/Trunk Assessment Upper Extremity Assessment Upper Extremity Assessment: Generalized weakness   Lower Extremity Assessment Lower Extremity Assessment: Generalized weakness;Defer to PT evaluation        Vision       Perception     Praxis      Cognition Arousal/Alertness: Awake/alert Behavior During Therapy: [facility] for tasks assessed/performed Overall Cognitive Status: Within Functional Limits for tasks assessed  General Comments: Appears slightly groggy, but did remember PT coming this morning to walk with her        Exercises      Shoulder Instructions       General Comments Much improved from yesterday; continues to have significant deficits and be high fall risk.     Pertinent Vitals/ Pain       Pain Assessment Pain Assessment: 0-10 Pain Score: 6  Pain Location: back Pain Descriptors / Indicators: Aching, Sharp Pain Intervention(s): Limited activity within patient's tolerance, Monitored during session  Home Living                                          Prior Functioning/Environment              Frequency  Min 3X/week        Progress Toward Goals  OT Goals(current goals can now be found in the care plan section)  Progress towards OT goals: Progressing toward goals  Acute Rehab OT Goals Patient Stated Goal: Get better; pain to stop OT Goal Formulation: With patient/family Time For Goal Achievement: 10/17/22 Potential to Achieve Goals: Good ADL Goals Pt Will Perform Upper Body Dressing: sitting;with supervision Pt Will Perform Lower Body Dressing: with mod assist;sit to/from stand Pt Will Transfer to Toilet: with min assist;stand pivot transfer;bedside commode Pt Will Perform Toileting - Clothing Manipulation and hygiene: with modified independence;sit to/from stand Additional ADL Goal #1: Pt will verbalize at least 3 learned strategies to promote safety/indep with ADL during recovery.  Plan Discharge plan remains appropriate    Co-evaluation                 AM-PAC OT "6 Clicks" Daily Activity     Outcome Measure   Help from another person eating meals?: None Help from another person taking care of personal grooming?: A Little Help from another person toileting, which includes using toliet, bedpan, or urinal?: A Lot Help from another person bathing (including washing, rinsing, drying)?: A Lot Help from another person to put on and taking off regular upper body clothing?: A Little Help from another person to put on and taking off regular lower body clothing?: A Lot 6 Click Score: 16    End of Session Equipment Utilized During Treatment: Rolling walker (2 wheels)  OT Visit Diagnosis: Other  abnormalities of gait and mobility (R26.89);Pain   Activity Tolerance No increased pain;Patient limited by fatigue   Patient Left in bed;with call bell/phone within reach;with bed alarm set   Nurse Communication Mobility status        Time: 1415-1435 OT Time Calculation (min): 20 min  Charges: OT General Charges $OT Visit: 1 Visit OT Treatments $Self Care/Home Management : 8-22 mins  Linward Foster, MS, OTR/L   Alvester Morin 10/04/2022, 2:49 PM

## 2022-10-05 ENCOUNTER — Inpatient Hospital Stay: Payer: 59

## 2022-10-05 LAB — GLUCOSE, CAPILLARY
Glucose-Capillary: 219 mg/dL — ABNORMAL HIGH (ref 70–99)
Glucose-Capillary: 166 mg/dL — ABNORMAL HIGH (ref 70–99)
Glucose-Capillary: 139 mg/dL — ABNORMAL HIGH (ref 70–99)
Glucose-Capillary: 128 mg/dL — ABNORMAL HIGH (ref 70–99)

## 2022-10-05 MED ORDER — DORZOLAMIDE HCL-TIMOLOL MAL 2-0.5 % OP SOLN
1.0000 [drp] | Freq: Two times a day (BID) | OPHTHALMIC | Status: DC
  Administered 2022-10-06 – 2022-10-09 (×6): 1 [drp] via OPHTHALMIC
  Filled 2022-10-05: qty 10

## 2022-10-05 MED ORDER — LIVING WELL WITH DIABETES BOOK
Freq: Once | Status: AC
  Filled 2022-10-05: qty 1

## 2022-10-05 MED ORDER — POLYETHYLENE GLYCOL 3350 17 G PO PACK
17.0000 g | PACK | Freq: Every day | ORAL | Status: DC
  Administered 2022-10-05 – 2022-10-06 (×2): 17 g via ORAL
  Filled 2022-10-05 (×5): qty 1

## 2022-10-05 MED ORDER — LACTULOSE 10 GM/15ML PO SOLN
20.0000 g | ORAL | Status: AC
  Administered 2022-10-05: 20 g via ORAL
  Filled 2022-10-05: qty 30

## 2022-10-05 NOTE — Plan of Care (Signed)
  Problem: Education: Goal: Ability to describe self-care measures that may prevent or decrease complications (Diabetes Survival Skills Education) will improve Outcome: Progressing   Problem: Coping: Goal: Ability to adjust to condition or change in health will improve Outcome: Progressing   Problem: Fluid Volume: Goal: Ability to maintain a balanced intake and output will improve Outcome: Progressing   Problem: Metabolic: Goal: Ability to maintain appropriate glucose levels will improve Outcome: Progressing   Problem: Nutritional: Goal: Maintenance of adequate nutrition will improve Outcome: Progressing Goal: Progress toward achieving an optimal weight will improve Outcome: Progressing   

## 2022-10-05 NOTE — Plan of Care (Signed)
Patient A&Ox4, from home, up with walker and assist in room. Pt continues to c/o constipation symptoms this shift. Prune juice offered by this RN and patient declined.

## 2022-10-05 NOTE — Inpatient Diabetes Management (Signed)
Inpatient Diabetes Program Recommendations  AACE/ADA: New Consensus Statement on Inpatient Glycemic Control (2015)  Target Ranges:  Prepandial:   less than 140 mg/dL      Peak postprandial:   less than 180 mg/dL (1-2 hours)      Critically ill patients:  140 - 180 mg/dL   Lab Results  Component Value Date   GLUCAP 219 (H) 10/05/2022   HGBA1C 7.6 (A) 09/18/2022    Latest Reference Range & Units 10/04/22 07:48 10/04/22 12:25 10/04/22 16:57 10/04/22 21:26 10/05/22 07:27  Glucose-Capillary 70 - 99 mg/dL 578 (H) 469 (H) 629 (H) 229 (H) 219 (H)  (H): Data is abnormally high  Diabetes history: DM2 Outpatient Diabetes medications: Metformin 1500 mg qd Current orders for Inpatient glycemic control: Novolog 0-15 units tid, 0-5 units hs  Inpatient Diabetes Program Recommendations:   Received consult and discussed when Metformin to be restarted with Manning Charity - plans include to restart today. Agree with plan and followup with PCP regarding continued diabetes management. Ordered Living Well with Diabetes booklet for patient to review.  Thank you, Billy Fischer. Iniya Matzek, RN, MSN, CDE  Diabetes Coordinator Inpatient Glycemic Control Team Team Pager [phone] (8am-5pm) 10/05/2022 10:40 AM

## 2022-10-05 NOTE — Progress Notes (Addendum)
    Attending Progress Note  History: Julie Wall is a 67 y.o female s/p T11-12 decompression and fusion  POD3: Pt reporting abdominal pain this morning. She has not had a BM in several days POD2: Improved leg numbness this morning  POD1: expected back pain this morning  Physical Exam: Vitals:   10/05/22 0306 10/05/22 0727  BP: 126/80 (!) 106/53  Pulse: (!) 104 (!) 101  Resp: 20 16  Temp: 98.7 F (37.1 C) 98.3 F (36.8 C)  SpO2: 91% 94%    AA Ox3 CNI  Strength:5/5 throughout BLE   Incision wound vac in place HV 10  Data:  Other tests/results: none   Assessment/Plan:  Julie Wall is a 67 y.o with a history of thoracic stenosis and myelopathy s/p T11-12 decompression and fusion  - mobilize - pain control - DVT prophylaxis - PTOT; pt expressed desire for rehab. Plans for [facility] pending approval - abdominal xray ordered for pain and constipation - aggressive bowel regimen. - HV removed 5/2  Manning Charity PA-C Department of Neurosurgery  This is an addendum to the note above.  Please note that the patient is being treated for diabetes type 2 with hyperglycemia.  All the rest of the plan is as above.

## 2022-10-05 NOTE — Progress Notes (Signed)
Physical Therapy Treatment Patient Details Name: Julie Wall MRN: [id number] DOB: [birth year] Today's Date: 10/05/2022   History of Present Illness Julie Wall is a 67 y.o female with a history of thoracic stenosis and myelopathy s/p T11-12 decompression and fusion. Pt is POD 1 at time of evaluation.    PT Comments    Pt received on BSC attempting to have bowel movement, patient successful. Patient did require assistance with toilet hygiene this date. Patient reports significant pain but agreeable to work on transfers. Patient able to stand from [facility] with contact guard and use of RW. Stand pivot with RW and Min A required to transfer from [facility] to bed, still with mild buckling noted on RLE. PT encouraging ambulation this date, but patient refusing due to pain. Left in sidelying, positioned appropriately with all needs met and bed alarm set. Patient will continue to benefit from skilled acute PT services while admitted.    Recommendations for follow up therapy are one component of a multi-disciplinary discharge planning process, led by the attending physician.  Recommendations may be updated based on patient status, additional functional criteria and insurance authorization.  Follow Up Recommendations  Can patient physically be transported by private vehicle: No    Assistance Recommended at Discharge Frequent or constant Supervision/Assistance  Patient can return home with the following Two people to help with walking and/or transfers;Assist for transportation;Help with stairs or ramp for entrance;Two people to help with bathing/dressing/bathroom   Equipment Recommendations  Other (comment) (to be determined at next level of care)    Recommendations for Other Services       Precautions / Restrictions Precautions Precautions: Back Restrictions Weight Bearing Restrictions: No     Mobility  Bed Mobility Overal bed mobility: Needs Assistance Bed Mobility: Sit to Sidelying         Sit  to sidelying: Min assist (assist with BLE) General bed mobility comments: continued cues for log roll, unable to verbalize spinal precautions therefore education provided on spot    Transfers Overall transfer level: Needs assistance Equipment used: Rolling walker (2 wheels) Transfers: Sit to/from Stand, Bed to chair/wheelchair/BSC Sit to Stand: Min guard Stand pivot transfers: Min assist         General transfer comment: continue to report increase pain with transitions. Completed sit > stand from bedside commode with CGA and use of RW. Patient require Min A with transfer from bedside commode to bed, intermittent mild buckling still noted on RLE.    Ambulation/Gait               General Gait Details: PT encouraging ambulation in room; patient refuse 2/2 pain.   Stairs             Wheelchair Mobility    Modified Rankin (Stroke Patients Only)       Balance Overall balance assessment: Needs assistance Sitting-balance support: Feet supported Sitting balance-Leahy Scale: Good     Standing balance support: Bilateral upper extremity supported, Reliant on assistive device for balance, During functional activity Standing balance-Leahy Scale: Fair Standing balance comment: improved stability with RW noted this date                            Cognition Arousal/Alertness: Awake/alert Behavior During Therapy: WFL for tasks assessed/performed Overall Cognitive Status: Within Functional Limits for tasks assessed  General Comments: Pt agreeable to work with PT despite pain tolerance, requesting to get back to bed.        Exercises      General Comments General comments (skin integrity, edema, etc.): After bowel movement, patient require assistance with toilet hygienee. PT also educating on importance of mobility/OOB activity      Pertinent Vitals/Pain Pain Assessment Pain Assessment: 0-10 Pain Score: 9   Pain Location: low back Pain Descriptors / Indicators: Aching Pain Intervention(s): Limited activity within patient's tolerance    Home Living                          Prior Function            PT Goals (current goals can now be found in the care plan section) Acute Rehab PT Goals PT Goal Formulation: With patient Time For Goal Achievement: 10/17/22 Potential to Achieve Goals: Fair Progress towards PT goals: Progressing toward goals    Frequency    7X/week      PT Plan Current plan remains appropriate    Co-evaluation              AM-PAC PT "6 Clicks" Mobility   Outcome Measure  Help needed turning from your back to your side while in a flat bed without using bedrails?: A Little Help needed moving from lying on your back to sitting on the side of a flat bed without using bedrails?: A Little Help needed moving to and from a bed to a chair (including a wheelchair)?: A Little Help needed standing up from a chair using your arms (e.g., wheelchair or bedside chair)?: A Little Help needed to walk in hospital room?: A Lot Help needed climbing 3-5 steps with a railing? : Total 6 Click Score: 15    End of Session Equipment Utilized During Treatment: Gait belt Activity Tolerance: Patient limited by pain Patient left: in bed;with bed alarm set Nurse Communication: Mobility status;Patient requests pain meds PT Visit Diagnosis: Other abnormalities of gait and mobility (R26.89);Muscle weakness (generalized) (M62.81);Difficulty in walking, not elsewhere classified (R26.2)     Time: 1610-9604 PT Time Calculation (min) (ACUTE ONLY): 16 min  Charges:  $Therapeutic Activity: 8-22 mins                     Howie Ill, PT, DPT 10/05/22 3:48 PM

## 2022-10-05 NOTE — Care Management Important Message (Signed)
Important Message  Patient Details  Name: Julie Wall MRN: [id number] Date of Birth: May 27, 1956   Medicare Important Message Given:  Yes     Julie Wall 10/05/2022, 2:14 PM

## 2022-10-05 NOTE — Progress Notes (Signed)
Occupational Therapy Treatment Patient Details Name: Audreyana Huntsberry MRN: [id number] DOB: [birth year] Today's Date: 10/05/2022   History of present illness Betta Meisenheimer is a 67 y.o female with a history of thoracic stenosis and myelopathy s/p T11-12 decompression and fusion. Pt is POD 1 at time of evaluation.   OT comments  Pt received semi-reclined in bed. Appearing to be in pain, but willing to work with OT on grooming. See flowsheet below for further details of session. Plan was to attempt grooming at sink in standing, but pt unable to tolerate standing, so grooming from seated after sidesteps.  Left sidelying, positioned with pillows, daughter in reach,  with all needs in reach.  Patient will benefit from continued OT while in acute care.    Recommendations for follow up therapy are one component of a multi-disciplinary discharge planning process, led by the attending physician.  Recommendations may be updated based on patient status, additional functional criteria and insurance authorization.    Assistance Recommended at Discharge Frequent or constant Supervision/Assistance  Patient can return home with the following  A lot of help with walking and/or transfers;A lot of help with bathing/dressing/bathroom;Assistance with cooking/housework;Direct supervision/assist for medications management;Direct supervision/assist for financial management;Assist for transportation;Help with stairs or ramp for entrance   Equipment Recommendations  Other (comment) (defer to next venue of care)    Recommendations for Other Services      Precautions / Restrictions Precautions Precautions: Back Restrictions Weight Bearing Restrictions: No       Mobility Bed Mobility Overal bed mobility: Needs Assistance Bed Mobility: Rolling, Supine to Sit, Sit to Supine Rolling: Min guard Sidelying to sit: Min assist   Sit to supine: Min assist   General bed mobility comments: Cues, but improved today with log  roll technique    Transfers Overall transfer level: Needs assistance Equipment used: Rolling walker (2 wheels) Transfers: Sit to/from Stand Sit to Stand: Min guard           General transfer comment: Increase in pain and great effort to t/f sit to stand; cues for hand placement.     Balance Overall balance assessment: Needs assistance Sitting-balance support: Feet supported Sitting balance-Leahy Scale: Good     Standing balance support: Bilateral upper extremity supported, Reliant on assistive device for balance, During functional activity Standing balance-Leahy Scale: Poor                             ADL either performed or assessed with clinical judgement   ADL Overall ADL's : Needs assistance/impaired     Grooming: Set up;Sitting;Wash/dry face;Oral care Grooming Details (indicate cue type and reason): Attempted to do grooming in standing, but pt in too much pain to release UE from RW. Needing to sit for grooming.                             Functional mobility during ADLs: Rolling walker (2 wheels) General ADL Comments: Pt tolerated sitting EOB for approx 5 minutes today after sidestepping 2 feet to the L with RW CGA at EOB to reposition. Pt with increase in pain in transfers.    Extremity/Trunk Assessment Upper Extremity Assessment Upper Extremity Assessment: Generalized weakness   Lower Extremity Assessment Lower Extremity Assessment: Generalized weakness;Defer to PT evaluation        Vision       Perception     Praxis  Cognition Arousal/Alertness: Awake/alert Behavior During Therapy: WFL for tasks assessed/performed Overall Cognitive Status: Within Functional Limits for tasks assessed                                 General Comments: Willing to work with OT despite pain        Exercises      Shoulder Instructions       General Comments Pt reports being constipated. Education provided on mobility and  other modalities for mitigating constipation.    Pertinent Vitals/ Pain       Pain Assessment Pain Assessment: 0-10 Pain Score: 7  Pain Location: back and abdomen Pain Descriptors / Indicators: Aching, Sharp Pain Intervention(s): Limited activity within patient's tolerance, Monitored during session, Premedicated before session  Home Living                                          Prior Functioning/Environment              Frequency  Min 3X/week        Progress Toward Goals  OT Goals(current goals can now be found in the care plan section)  Progress towards OT goals: Progressing toward goals  Acute Rehab OT Goals Patient Stated Goal: Get better; pain to stop OT Goal Formulation: With patient/family Time For Goal Achievement: 10/17/22 Potential to Achieve Goals: Good ADL Goals Pt Will Perform Upper Body Dressing: sitting;with supervision Pt Will Perform Lower Body Dressing: with mod assist;sit to/from stand Pt Will Transfer to Toilet: with min assist;stand pivot transfer;bedside commode Pt Will Perform Toileting - Clothing Manipulation and hygiene: with modified independence;sit to/from stand Additional ADL Goal #1: Pt will verbalize at least 3 learned strategies to promote safety/indep with ADL during recovery.  Plan Discharge plan remains appropriate    Co-evaluation                 AM-PAC OT "6 Clicks" Daily Activity     Outcome Measure   Help from another person eating meals?: None Help from another person taking care of personal grooming?: A Little Help from another person toileting, which includes using toliet, bedpan, or urinal?: A Lot Help from another person bathing (including washing, rinsing, drying)?: A Lot Help from another person to put on and taking off regular upper body clothing?: A Little Help from another person to put on and taking off regular lower body clothing?: A Lot 6 Click Score: 16    End of Session Equipment  Utilized During Treatment: Rolling walker (2 wheels)  OT Visit Diagnosis: Other abnormalities of gait and mobility (R26.89);Pain   Activity Tolerance Patient limited by pain   Patient Left in bed;with call bell/phone within reach;with bed alarm set;with family/visitor present   Nurse Communication Mobility status        Time: 1101-1120 OT Time Calculation (min): 19 min  Charges: OT General Charges $OT Visit: 1 Visit OT Treatments $Self Care/Home Management : 8-22 mins  Linward Foster, MS, OTR/L  Alvester Morin 10/05/2022, 11:40 AM

## 2022-10-06 LAB — GLUCOSE, CAPILLARY
Glucose-Capillary: 138 mg/dL — ABNORMAL HIGH (ref 70–99)
Glucose-Capillary: 127 mg/dL — ABNORMAL HIGH (ref 70–99)
Glucose-Capillary: 134 mg/dL — ABNORMAL HIGH (ref 70–99)
Glucose-Capillary: 161 mg/dL — ABNORMAL HIGH (ref 70–99)

## 2022-10-06 NOTE — Progress Notes (Signed)
Progress Note  History: Julie Wall is a 67 y.o female s/p T11-12 decompression and fusion  POD4: Pt up with staff going to the bathroom POD3: Pt reporting abdominal pain this morning. She has not had a BM in several days POD2: Improved leg numbness this morning  POD1: expected back pain this morning  Physical Exam: Vitals:   10/06/22 0418 10/06/22 0809  BP: 123/71 117/77  Pulse: 97 (!) 102  Resp: 20 18  Temp: 99 F (37.2 C) 99.7 F (37.6 C)  SpO2: 93% 95%    AA Ox3 CNI  Strength:actively ambulating to the bathroom  Incision wound vac in place Data:  Other tests/results:  Abd xray 10/05/22 IMPRESSION: 1. Large amount of gas and stool in the colon particularly on the right side of the abdomen. Findings compatible with constipation. 2. Postsurgical changes in lower thoracic spine.     Electronically Signed   By: Richarda Overlie M.D.   On: 10/05/2022 11:14  Assessment/Plan:  Julie Wall is a 67 y.o with a history of thoracic stenosis and myelopathy s/p T11-12 decompression and fusion  - mobilize - pain control - DVT prophylaxis - PTOT; pt expressed desire for rehab. Plans for [facility] pending approval - aggressive bowel regimen. - continue to monitor BG for DM II with hyperglycemia - HV removed 5/2  Manning Charity PA-C Department of Neurosurgery

## 2022-10-06 NOTE — Progress Notes (Signed)
Physical Therapy Treatment Patient Details Name: Tyteanna Buckman MRN: [id number] DOB: September 10, 1955 Today's Date: 10/06/2022   History of Present Illness Omayra Hancox is a 67 y.o female with a history of thoracic stenosis and myelopathy s/p T11-12 decompression and fusion.    PT Comments    Patient alert, agreeable to PT with some encouragement, stated she had just gotten back from using the restroom, 6/10 pain in thoracic spine at rest. Pt able to demonstrate proper log roll technique, but very challenged due to pain, CGA and extra time to complete, reliance on bed rails. Sit <> stand with RW and CGA, still with complaints of RLE weakness. She ambulated ~15 with CGA but deferred further distances due to increased pain. Returned to bed with all needs in reach. The patient would benefit from further skilled PT intervention to continue to progress towards goals.     Recommendations for follow up therapy are one component of a multi-disciplinary discharge planning process, led by the attending physician.  Recommendations may be updated based on patient status, additional functional criteria and insurance authorization.  Follow Up Recommendations  Can patient physically be transported by private vehicle: No    Assistance Recommended at Discharge Frequent or constant Supervision/Assistance  Patient can return home with the following Two people to help with walking and/or transfers;Assist for transportation;Help with stairs or ramp for entrance;Two people to help with bathing/dressing/bathroom   Equipment Recommendations  Other (comment) (to be determined at next level of care)    Recommendations for Other Services       Precautions / Restrictions Precautions Precautions: Back Restrictions Weight Bearing Restrictions: No     Mobility  Bed Mobility Overal bed mobility: Needs Assistance Bed Mobility: Sit to Sidelying, Sidelying to Sit Rolling: Min guard Sidelying to sit: Min guard   Sit to  supine: Min guard Sit to sidelying: Min assist      Transfers Overall transfer level: Needs assistance Equipment used: Rolling walker (2 wheels) Transfers: Sit to/from Stand Sit to Stand: Min guard           General transfer comment: continue to report increase pain with transitions    Ambulation/Gait Ambulation/Gait assistance: Min guard Gait Distance (Feet): 15 Feet Assistive device: Rolling walker (2 wheels) Gait Pattern/deviations: Step-to pattern           Stairs             Wheelchair Mobility    Modified Rankin (Stroke Patients Only)       Balance Overall balance assessment: Needs assistance Sitting-balance support: Feet supported Sitting balance-Leahy Scale: Good                                      Cognition Arousal/Alertness: Awake/alert Behavior During Therapy: WFL for tasks assessed/performed Overall Cognitive Status: Within Functional Limits for tasks assessed                                          Exercises      General Comments        Pertinent Vitals/Pain Pain Assessment Pain Assessment: 0-10 Pain Score: 6  Pain Location: 6/10 R sided thoracic pain at rest Pain Descriptors / Indicators: Aching Pain Intervention(s): Limited activity within patient's tolerance, Monitored during session, Premedicated before session, Repositioned    Home Living  Prior Function            PT Goals (current goals can now be found in the care plan section) Progress towards PT goals: Progressing toward goals    Frequency    7X/week      PT Plan Current plan remains appropriate    Co-evaluation              AM-PAC PT "6 Clicks" Mobility   Outcome Measure  Help needed turning from your back to your side while in a flat bed without using bedrails?: A Little Help needed moving from lying on your back to sitting on the side of a flat bed without using  bedrails?: A Little Help needed moving to and from a bed to a chair (including a wheelchair)?: A Little Help needed standing up from a chair using your arms (e.g., wheelchair or bedside chair)?: A Little Help needed to walk in hospital room?: A Little Help needed climbing 3-5 steps with a railing? : Total 6 Click Score: 16    End of Session Equipment Utilized During Treatment: Gait belt Activity Tolerance: Patient limited by pain Patient left: in bed;with bed alarm set;with call bell/phone within reach Nurse Communication: Mobility status;Patient requests pain meds PT Visit Diagnosis: Other abnormalities of gait and mobility (R26.89);Muscle weakness (generalized) (M62.81);Difficulty in walking, not elsewhere classified (R26.2)     Time: 1610-9604 PT Time Calculation (min) (ACUTE ONLY): 13 min  Charges:  $Therapeutic Activity: 8-22 mins                     Olga Coaster PT, DPT 11:07 AM,10/06/22

## 2022-10-06 NOTE — TOC Progression Note (Addendum)
Transition of Care [facility]) - Progression Note    Patient Details  Name: Julie Wall MRN: [id number] Date of Birth: 07-16-1955  [facility] CM/SW Contact  Garret Reddish, RN Phone Number: 10/06/2022, 2:52 PM  Clinical Narrative:  I have reviewed bed offers today with patient and her daughter.  Patient and her daughter Julie Wall have selected [facility]  I have spoken with Tiffany with [facility] and the the facility can accept patient on Monday.  I have made provider aware.  Patient will need insurance authorization prior to discharge.    TOC will continue to follow for discharge planning.     Expected Discharge Plan: Skilled Nursing Facility Barriers to Discharge: No Barriers Identified  Expected Discharge Plan and Services   Discharge Planning Services: CM Consult Post Acute Care Choice:  (Patient would like to discharge to [facility] on discharge.  Will completed a bed search.) Living arrangements for the past 2 months: Single Family Home                                       Social Determinants of Health (SDOH) Interventions SDOH Screenings   Food Insecurity: No Food Insecurity (10/02/2022)  Housing: Low Risk  (10/02/2022)  Transportation Needs: No Transportation Needs (10/02/2022)  Utilities: Not At Risk (10/02/2022)  Alcohol Screen: Low Risk  (07/20/2022)  Depression (PHQ2-9): Low Risk  (09/18/2022)  Financial Resource Strain: Low Risk  (07/20/2022)  Physical Activity: Sufficiently Active (07/20/2022)  Recent Concern: Physical Activity - Insufficiently Active (05/25/2022)  Social Connections: Moderately Isolated (07/20/2022)  Stress: No Stress Concern Present (07/20/2022)  Tobacco Use: High Risk (10/03/2022)    Readmission Risk Interventions     No data to display

## 2022-10-06 NOTE — Plan of Care (Signed)
  Problem: Education: Goal: Ability to describe self-care measures that may prevent or decrease complications (Diabetes Survival Skills Education) will improve Outcome: Progressing Goal: Individualized Educational Video(s) Outcome: Progressing   Problem: Coping: Goal: Ability to adjust to condition or change in health will improve Outcome: Progressing   Problem: Fluid Volume: Goal: Ability to maintain a balanced intake and output will improve Outcome: Progressing   Problem: Health Behavior/Discharge Planning: Goal: Ability to identify and utilize available resources and services will improve Outcome: Progressing Goal: Ability to manage health-related needs will improve Outcome: Progressing   Problem: Metabolic: Goal: Ability to maintain appropriate glucose levels will improve Outcome: Progressing   Problem: Nutritional: Goal: Maintenance of adequate nutrition will improve Outcome: Progressing Goal: Progress toward achieving an optimal weight will improve Outcome: Progressing   Problem: Skin Integrity: Goal: Risk for impaired skin integrity will decrease Outcome: Progressing   Problem: Tissue Perfusion: Goal: Adequacy of tissue perfusion will improve Outcome: Progressing   Problem: Education: Goal: Ability to verbalize activity precautions or restrictions will improve Outcome: Progressing Goal: Knowledge of the prescribed therapeutic regimen will improve Outcome: Progressing Goal: Understanding of discharge needs will improve Outcome: Progressing   Problem: Activity: Goal: Ability to avoid complications of mobility impairment will improve Outcome: Progressing Goal: Ability to tolerate increased activity will improve Outcome: Progressing Goal: Will remain free from falls Outcome: Progressing   Problem: Bowel/Gastric: Goal: Gastrointestinal status for postoperative course will improve Outcome: Progressing   Problem: Clinical Measurements: Goal: Ability to  maintain clinical measurements within normal limits will improve Outcome: Progressing Goal: Postoperative complications will be avoided or minimized Outcome: Progressing Goal: Diagnostic test results will improve Outcome: Progressing   Problem: Pain Management: Goal: Pain level will decrease Outcome: Progressing   Problem: Skin Integrity: Goal: Will show signs of wound healing Outcome: Progressing   Problem: Health Behavior/Discharge Planning: Goal: Identification of resources available to assist in meeting health care needs will improve Outcome: Progressing   Problem: Bladder/Genitourinary: Goal: Urinary functional status for postoperative course will improve Outcome: Progressing   Problem: Education: Goal: Knowledge of General Education information will improve Description: Including pain rating scale, medication(s)/side effects and non-pharmacologic comfort measures Outcome: Progressing   Problem: Health Behavior/Discharge Planning: Goal: Ability to manage health-related needs will improve Outcome: Progressing   Problem: Clinical Measurements: Goal: Ability to maintain clinical measurements within normal limits will improve Outcome: Progressing Goal: Will remain free from infection Outcome: Progressing Goal: Diagnostic test results will improve Outcome: Progressing Goal: Respiratory complications will improve Outcome: Progressing Goal: Cardiovascular complication will be avoided Outcome: Progressing   Problem: Activity: Goal: Risk for activity intolerance will decrease Outcome: Progressing   Problem: Nutrition: Goal: Adequate nutrition will be maintained Outcome: Progressing   Problem: Coping: Goal: Level of anxiety will decrease Outcome: Progressing   Problem: Elimination: Goal: Will not experience complications related to bowel motility Outcome: Progressing Goal: Will not experience complications related to urinary retention Outcome: Progressing    Problem: Pain Managment: Goal: General experience of comfort will improve Outcome: Progressing   Problem: Safety: Goal: Ability to remain free from injury will improve Outcome: Progressing   Problem: Skin Integrity: Goal: Risk for impaired skin integrity will decrease Outcome: Progressing   

## 2022-10-07 LAB — GLUCOSE, CAPILLARY
Glucose-Capillary: 160 mg/dL — ABNORMAL HIGH (ref 70–99)
Glucose-Capillary: 156 mg/dL — ABNORMAL HIGH (ref 70–99)
Glucose-Capillary: 226 mg/dL — ABNORMAL HIGH (ref 70–99)
Glucose-Capillary: 115 mg/dL — ABNORMAL HIGH (ref 70–99)

## 2022-10-07 NOTE — Plan of Care (Signed)
  Problem: Education: Goal: Ability to describe self-care measures that may prevent or decrease complications (Diabetes Survival Skills Education) will improve Outcome: Progressing Goal: Individualized Educational Video(s) Outcome: Progressing   Problem: Coping: Goal: Ability to adjust to condition or change in health will improve Outcome: Progressing   Problem: Fluid Volume: Goal: Ability to maintain a balanced intake and output will improve Outcome: Progressing   Problem: Health Behavior/Discharge Planning: Goal: Ability to identify and utilize available resources and services will improve Outcome: Progressing Goal: Ability to manage health-related needs will improve Outcome: Progressing   Problem: Metabolic: Goal: Ability to maintain appropriate glucose levels will improve Outcome: Progressing   Problem: Nutritional: Goal: Maintenance of adequate nutrition will improve Outcome: Progressing Goal: Progress toward achieving an optimal weight will improve Outcome: Progressing   Problem: Skin Integrity: Goal: Risk for impaired skin integrity will decrease Outcome: Progressing   Problem: Tissue Perfusion: Goal: Adequacy of tissue perfusion will improve Outcome: Progressing   Problem: Education: Goal: Ability to verbalize activity precautions or restrictions will improve Outcome: Progressing Goal: Knowledge of the prescribed therapeutic regimen will improve Outcome: Progressing Goal: Understanding of discharge needs will improve Outcome: Progressing   Problem: Activity: Goal: Ability to avoid complications of mobility impairment will improve Outcome: Progressing Goal: Ability to tolerate increased activity will improve Outcome: Progressing Goal: Will remain free from falls Outcome: Progressing   Problem: Bowel/Gastric: Goal: Gastrointestinal status for postoperative course will improve Outcome: Progressing   Problem: Clinical Measurements: Goal: Ability to  maintain clinical measurements within normal limits will improve Outcome: Progressing Goal: Postoperative complications will be avoided or minimized Outcome: Progressing Goal: Diagnostic test results will improve Outcome: Progressing   Problem: Pain Management: Goal: Pain level will decrease Outcome: Progressing   Problem: Skin Integrity: Goal: Will show signs of wound healing Outcome: Progressing   Problem: Health Behavior/Discharge Planning: Goal: Identification of resources available to assist in meeting health care needs will improve Outcome: Progressing   Problem: Bladder/Genitourinary: Goal: Urinary functional status for postoperative course will improve Outcome: Progressing   Problem: Education: Goal: Knowledge of General Education information will improve Description: Including pain rating scale, medication(s)/side effects and non-pharmacologic comfort measures Outcome: Progressing   Problem: Health Behavior/Discharge Planning: Goal: Ability to manage health-related needs will improve Outcome: Progressing   Problem: Clinical Measurements: Goal: Ability to maintain clinical measurements within normal limits will improve Outcome: Progressing Goal: Will remain free from infection Outcome: Progressing Goal: Diagnostic test results will improve Outcome: Progressing Goal: Respiratory complications will improve Outcome: Progressing Goal: Cardiovascular complication will be avoided Outcome: Progressing   Problem: Activity: Goal: Risk for activity intolerance will decrease Outcome: Progressing   Problem: Nutrition: Goal: Adequate nutrition will be maintained Outcome: Progressing   Problem: Coping: Goal: Level of anxiety will decrease Outcome: Progressing   Problem: Elimination: Goal: Will not experience complications related to bowel motility Outcome: Progressing Goal: Will not experience complications related to urinary retention Outcome: Progressing    Problem: Pain Managment: Goal: General experience of comfort will improve Outcome: Progressing   Problem: Safety: Goal: Ability to remain free from injury will improve Outcome: Progressing   Problem: Skin Integrity: Goal: Risk for impaired skin integrity will decrease Outcome: Progressing   

## 2022-10-07 NOTE — Progress Notes (Signed)
Progress Note  History: Julie Wall is a 67 y.o female s/p T11-12 decompression and fusion  POD5: Doing well, mobility improving POD4: Pt up with staff going to the bathroom POD3: Pt reporting abdominal pain this morning. She has not had a BM in several days POD2: Improved leg numbness this morning  POD1: expected back pain this morning  Physical Exam: Vitals:   10/06/22 2317 10/07/22 0812  BP: 107/70 104/60  Pulse: 86 87  Resp: 20 19  Temp: 98.2 F (36.8 C) 97.7 F (36.5 C)  SpO2: 95% 94%    AA Ox3 CNI  Strength:actively ambulating to the bathroom Moves LE well though slightly weaker with IP on R  Incision wound vac in place Data:  Other tests/results:  Abd xray 10/05/22 IMPRESSION: 1. Large amount of gas and stool in the colon particularly on the right side of the abdomen. Findings compatible with constipation. 2. Postsurgical changes in lower thoracic spine.     Electronically Signed   By: Richarda Overlie M.D.   On: 10/05/2022 11:14  Assessment/Plan:  Julie Wall is a 67 y.o with a history of thoracic stenosis and myelopathy s/p T11-12 decompression and fusion  - mobilize - pain control - DVT prophylaxis - PTOT; pt expressed desire for rehab. Plans for [facility] pending approval - aggressive bowel regimen. - continue to monitor BG for DM II with hyperglycemia - HV removed 5/2  Venetia Night MD Department of Neurosurgery

## 2022-10-07 NOTE — Progress Notes (Signed)
Physical Therapy Treatment Patient Details Name: Julie Wall MRN: [id number] DOB: 12-Jul-1955 Today's Date: 10/07/2022   History of Present Illness Julie Wall is a 67 y.o female with a history of thoracic stenosis and myelopathy s/p T11-12 decompression and fusion.    PT Comments    Pt is progressing with gait distance with RW ( 45' min guard) and is overall supervision to min guard with bed mobility and transfers.  Pt's functional mobility continues to be limited, pt reporting back pain, fatigue, and R LE weakness, and demonstrating decreased activity tolerance.  The patient would benefit from further skilled PT intervention to continue to progress towards goals.    Recommendations for follow up therapy are one component of a multi-disciplinary discharge planning process, led by the attending physician.  Recommendations may be updated based on patient status, additional functional criteria and insurance authorization.  Follow Up Recommendations  Can patient physically be transported by private vehicle: No    Assistance Recommended at Discharge Frequent or constant Supervision/Assistance  Patient can return home with the following Two people to help with walking and/or transfers;Assist for transportation;Help with stairs or ramp for entrance;Two people to help with bathing/dressing/bathroom   Equipment Recommendations  Other (comment) (to be determined at next level of care)    Recommendations for Other Services       Precautions / Restrictions Precautions Precautions: Back Precaution Booklet Issued: Yes (comment) Precaution Comments: educated and reviewed with patient to ensure understanding Restrictions Weight Bearing Restrictions: No     Mobility  Bed Mobility Overal bed mobility: Needs Assistance Bed Mobility: Sit to Sidelying, Sidelying to Sit Rolling: Supervision Sidelying to sit: Supervision   Sit to supine: Supervision Sit to sidelying: Supervision General bed  mobility comments: continued cues for log roll, pt followed through with precautions well.    Transfers Overall transfer level: Needs assistance Equipment used: Rolling walker (2 wheels) Transfers: Sit to/from Stand Sit to Stand: Min guard   Step pivot transfers: Min guard       General transfer comment: continue to report increase pain with transitions    Ambulation/Gait Ambulation/Gait assistance: Min guard Gait Distance (Feet): 45 Feet Assistive device: Rolling walker (2 wheels) Gait Pattern/deviations: Step-through pattern, Trunk flexed, Decreased step length - right, Decreased step length - left       General Gait Details: No buckling of R LE; pt required standing rest break and increased time. Self selected-distance.   Stairs             Wheelchair Mobility    Modified Rankin (Stroke Patients Only)       Balance Overall balance assessment: Needs assistance Sitting-balance support: Feet supported Sitting balance-Leahy Scale: Good Sitting balance - Comments: pt able to perform small weight shifts inside BOS without UE support.   Standing balance support: Reliant on assistive device for balance, During functional activity, Single extremity supported Standing balance-Leahy Scale: Fair Standing balance comment: pt able to perform alt. UE and bil. UE raised within BOS.                            Cognition Arousal/Alertness: Awake/alert Behavior During Therapy: WFL for tasks assessed/performed Overall Cognitive Status: Within Functional Limits for tasks assessed                                 General Comments: Pt agreeable to work with PT despite pain  tolerance, requesting to get back to bed.        Exercises Total Joint Exercises Ankle Circles/Pumps: Both, 5 reps, Strengthening, AROM Quad Sets: Both, 5 reps, Strengthening, AROM    General Comments        Pertinent Vitals/Pain Pain Assessment Pain Score: 6  Pain  Location: 5/10 back pain at rest increased to 6/10 with gait. Pain Descriptors / Indicators: Aching Pain Intervention(s): Limited activity within patient's tolerance, Monitored during session    Home Living                          Prior Function            PT Goals (current goals can now be found in the care plan section) Acute Rehab PT Goals Patient Stated Goal: "get home" PT Goal Formulation: With patient Time For Goal Achievement: 10/17/22 Potential to Achieve Goals: Fair Progress towards PT goals: Progressing toward goals    Frequency    7X/week      PT Plan Current plan remains appropriate    Co-evaluation              AM-PAC PT "6 Clicks" Mobility   Outcome Measure  Help needed turning from your back to your side while in a flat bed without using bedrails?: A Little Help needed moving from lying on your back to sitting on the side of a flat bed without using bedrails?: A Little Help needed moving to and from a bed to a chair (including a wheelchair)?: A Little Help needed standing up from a chair using your arms (e.g., wheelchair or bedside chair)?: A Little Help needed to walk in hospital room?: A Little Help needed climbing 3-5 steps with a railing? : A Lot 6 Click Score: 17    End of Session Equipment Utilized During Treatment: Gait belt Activity Tolerance: Patient limited by pain Patient left: in bed;with bed alarm set;with call bell/phone within reach Nurse Communication: Mobility status PT Visit Diagnosis: Other abnormalities of gait and mobility (R26.89);Muscle weakness (generalized) (M62.81);Difficulty in walking, not elsewhere classified (R26.2)     Time: 9147-8295 PT Time Calculation (min) (ACUTE ONLY): 17 min  Charges:  $Therapeutic Activity: 8-22 mins                     Hortencia Conradi, PTA  10/07/22, 1:18 PM

## 2022-10-08 LAB — GLUCOSE, CAPILLARY
Glucose-Capillary: 175 mg/dL — ABNORMAL HIGH (ref 70–99)
Glucose-Capillary: 156 mg/dL — ABNORMAL HIGH (ref 70–99)
Glucose-Capillary: 97 mg/dL (ref 70–99)
Glucose-Capillary: 199 mg/dL — ABNORMAL HIGH (ref 70–99)

## 2022-10-08 NOTE — TOC Progression Note (Addendum)
Transition of Care [facility]) - Progression Note    Patient Details  Name: Julie Wall MRN: [id number] Date of Birth: 1955-08-14  [facility] of [facility] CM/SW Contact  Liliana Cline, LCSW Phone Number: 10/08/2022, 11:17 AM  Clinical Narrative:    CSW called [employer] to start insurance authorization for [name]. Faxed clincials.   2:30- Tiffany with [employer] confirms patient can come tomorrow if Berkley Harvey is approved.   Expected Discharge Plan: Skilled Nursing Facility Barriers to Discharge: No Barriers Identified  Expected Discharge Plan and Services   Discharge Planning Services: CM Consult Post Acute Care Choice:  (Patient would like to discharge to [facility] on discharge.  Will completed a bed search.) Living arrangements for the past 2 months: Single Family Home                                       Social Determinants of Health (SDOH) Interventions SDOH Screenings   Food Insecurity: No Food Insecurity (10/02/2022)  Housing: Low Risk  (10/02/2022)  Transportation Needs: No Transportation Needs (10/02/2022)  Utilities: Not At Risk (10/02/2022)  Alcohol Screen: Low Risk  (07/20/2022)  Depression (PHQ2-9): Low Risk  (09/18/2022)  Financial Resource Strain: Low Risk  (07/20/2022)  Physical Activity: Sufficiently Active (07/20/2022)  Recent Concern: Physical Activity - Insufficiently Active (05/25/2022)  Social Connections: Moderately Isolated (07/20/2022)  Stress: No Stress Concern Present (07/20/2022)  Tobacco Use: High Risk (10/03/2022)    Readmission Risk Interventions     No data to display

## 2022-10-08 NOTE — Progress Notes (Signed)
Progress Note  History: Tashanda Brener is a 67 y.o female s/p T11-12 decompression and fusion  POD6: Doing well. POD5: Doing well, mobility improving POD4: Pt up with staff going to the bathroom POD3: Pt reporting abdominal pain this morning. She has not had a BM in several days POD2: Improved leg numbness this morning  POD1: expected back pain this morning  Physical Exam: Vitals:   10/08/22 0419 10/08/22 0901  BP: 122/67 110/60  Pulse: 81 85  Resp: 18 17  Temp: 97.7 F (36.5 C) 98.8 F (37.1 C)  SpO2: 94% 97%    AA Ox3 CNI  Strength:actively ambulating to the bathroom Moves LE well though slightly weaker with IP on R  Incision wound vac in place Data:  Other tests/results:  Abd xray 10/05/22 IMPRESSION: 1. Large amount of gas and stool in the colon particularly on the right side of the abdomen. Findings compatible with constipation. 2. Postsurgical changes in lower thoracic spine.     Electronically Signed   By: Richarda Overlie M.D.   On: 10/05/2022 11:14  Assessment/Plan:  Shantell Remaly is a 67 y.o with a history of thoracic stenosis and myelopathy s/p T11-12 decompression and fusion  - mobilize - pain control - DVT prophylaxis - PTOT; pt expressed desire for rehab. Plans for [employer] pending approval - aggressive bowel regimen. - continue to monitor BG for DM II with hyperglycemia - HV removed 5/2  Venetia Night MD Department of Neurosurgery

## 2022-10-08 NOTE — Plan of Care (Signed)
  Problem: Education: Goal: Ability to describe self-care measures that may prevent or decrease complications (Diabetes Survival Skills Education) will improve Outcome: Progressing Goal: Individualized Educational Video(s) Outcome: Progressing   Problem: Coping: Goal: Ability to adjust to condition or change in health will improve Outcome: Progressing   Problem: Fluid Volume: Goal: Ability to maintain a balanced intake and output will improve Outcome: Progressing   Problem: Health Behavior/Discharge Planning: Goal: Ability to identify and utilize available resources and services will improve Outcome: Progressing Goal: Ability to manage health-related needs will improve Outcome: Progressing   Problem: Metabolic: Goal: Ability to maintain appropriate glucose levels will improve Outcome: Progressing   Problem: Nutritional: Goal: Maintenance of adequate nutrition will improve Outcome: Progressing Goal: Progress toward achieving an optimal weight will improve Outcome: Progressing   Problem: Skin Integrity: Goal: Risk for impaired skin integrity will decrease Outcome: Progressing   Problem: Tissue Perfusion: Goal: Adequacy of tissue perfusion will improve Outcome: Progressing   Problem: Education: Goal: Ability to verbalize activity precautions or restrictions will improve Outcome: Progressing Goal: Knowledge of the prescribed therapeutic regimen will improve Outcome: Progressing Goal: Understanding of discharge needs will improve Outcome: Progressing   Problem: Activity: Goal: Ability to avoid complications of mobility impairment will improve Outcome: Progressing Goal: Ability to tolerate increased activity will improve Outcome: Progressing Goal: Will remain free from falls Outcome: Progressing   Problem: Bowel/Gastric: Goal: Gastrointestinal status for postoperative course will improve Outcome: Progressing   Problem: Clinical Measurements: Goal: Ability to  maintain clinical measurements within normal limits will improve Outcome: Progressing Goal: Postoperative complications will be avoided or minimized Outcome: Progressing Goal: Diagnostic test results will improve Outcome: Progressing   Problem: Pain Management: Goal: Pain level will decrease Outcome: Progressing   Problem: Skin Integrity: Goal: Will show signs of wound healing Outcome: Progressing   Problem: Health Behavior/Discharge Planning: Goal: Identification of resources available to assist in meeting health care needs will improve Outcome: Progressing   Problem: Bladder/Genitourinary: Goal: Urinary functional status for postoperative course will improve Outcome: Progressing   Problem: Education: Goal: Knowledge of General Education information will improve Description: Including pain rating scale, medication(s)/side effects and non-pharmacologic comfort measures Outcome: Progressing   Problem: Health Behavior/Discharge Planning: Goal: Ability to manage health-related needs will improve Outcome: Progressing   Problem: Clinical Measurements: Goal: Ability to maintain clinical measurements within normal limits will improve Outcome: Progressing Goal: Will remain free from infection Outcome: Progressing Goal: Diagnostic test results will improve Outcome: Progressing Goal: Respiratory complications will improve Outcome: Progressing Goal: Cardiovascular complication will be avoided Outcome: Progressing   Problem: Activity: Goal: Risk for activity intolerance will decrease Outcome: Progressing   Problem: Nutrition: Goal: Adequate nutrition will be maintained Outcome: Progressing   Problem: Coping: Goal: Level of anxiety will decrease Outcome: Progressing   Problem: Elimination: Goal: Will not experience complications related to bowel motility Outcome: Progressing Goal: Will not experience complications related to urinary retention Outcome: Progressing    Problem: Pain Managment: Goal: General experience of comfort will improve Outcome: Progressing   Problem: Safety: Goal: Ability to remain free from injury will improve Outcome: Progressing   Problem: Skin Integrity: Goal: Risk for impaired skin integrity will decrease Outcome: Progressing   

## 2022-10-08 NOTE — Progress Notes (Signed)
Physical Therapy Treatment Patient Details Name: Julie Wall MRN: [id number] DOB: 12-29-1955 Today's Date: 10/08/2022   History of Present Illness Julie Wall is a 67 y.o female with a history of thoracic stenosis and myelopathy s/p T11-12 decompression and fusion.    PT Comments    Pt ready for session.  Pre-medicated.  To EOB with rails and increased time. Steady once sitting.  Stands x2 during session with min guard/assist.  Cues for hand placements and is generally unsteady with pain during transitions.  Once up she is able to progress gait 75' x 2 with RW and min guard.  RLE c/o numbness and does ER with gait but no buckling noted but does appear generally weak.  Remained in chair after session and opts to have LE's down for comfort.   Recommendations for follow up therapy are one component of a multi-disciplinary discharge planning process, led by the attending physician.  Recommendations may be updated based on patient status, additional functional criteria and insurance authorization.  Follow Up Recommendations  Can patient physically be transported by private vehicle: No    Assistance Recommended at Discharge Frequent or constant Supervision/Assistance  Patient can return home with the following Two people to help with walking and/or transfers;Assist for transportation;Help with stairs or ramp for entrance;Two people to help with bathing/dressing/bathroom   Equipment Recommendations  Other (comment) (to be determined at next level of care)    Recommendations for Other Services       Precautions / Restrictions Precautions Precautions: Back Restrictions Weight Bearing Restrictions: No     Mobility  Bed Mobility Overal bed mobility: Needs Assistance Bed Mobility: Supine to Sit Rolling: Min guard           Patient Response: Cooperative  Transfers Overall transfer level: Needs assistance Equipment used: Rolling walker (2 wheels) Transfers: Sit to/from Stand Sit  to Stand: Min guard, Min assist           General transfer comment: somewhat usnteady upon standing but progressing.    Ambulation/Gait Ambulation/Gait assistance: Min guard Gait Distance (Feet): 75 Feet Assistive device: Rolling walker (2 wheels) Gait Pattern/deviations: Step-through pattern, Trunk flexed, Decreased step length - right, Decreased step length - left Gait velocity: decreased     General Gait Details: 12' x 2 with RW - seated rest on bed for safety.  continues wiht R LE c/o numbness and does ER with gait but no buckling noted   Stairs             Wheelchair Mobility    Modified Rankin (Stroke Patients Only)       Balance Overall balance assessment: Needs assistance Sitting-balance support: Feet supported Sitting balance-Leahy Scale: Good     Standing balance support: Reliant on assistive device for balance, During functional activity, Single extremity supported Standing balance-Leahy Scale: Fair                              Cognition Arousal/Alertness: Awake/alert Behavior During Therapy: WFL for tasks assessed/performed Overall Cognitive Status: Within Functional Limits for tasks assessed                                          Exercises      General Comments        Pertinent Vitals/Pain Pain Assessment Pain Assessment: Faces Faces Pain Scale: Hurts little  more Pain Descriptors / Indicators: Aching, Sore Pain Intervention(s): Premedicated before session, Repositioned, Monitored during session    Home Living                          Prior Function            PT Goals (current goals can now be found in the care plan section) Progress towards PT goals: Progressing toward goals    Frequency    7X/week      PT Plan Current plan remains appropriate    Co-evaluation              AM-PAC PT "6 Clicks" Mobility   Outcome Measure  Help needed turning from your back to your  side while in a flat bed without using bedrails?: A Little Help needed moving from lying on your back to sitting on the side of a flat bed without using bedrails?: A Little Help needed moving to and from a bed to a chair (including a wheelchair)?: A Little Help needed standing up from a chair using your arms (e.g., wheelchair or bedside chair)?: A Little Help needed to walk in hospital room?: A Little Help needed climbing 3-5 steps with a railing? : A Lot 6 Click Score: 17    End of Session Equipment Utilized During Treatment: Gait belt Activity Tolerance: Patient limited by pain Patient left: in bed;with bed alarm set;with call bell/phone within reach Nurse Communication: Mobility status PT Visit Diagnosis: Other abnormalities of gait and mobility (R26.89);Muscle weakness (generalized) (M62.81);Difficulty in walking, not elsewhere classified (R26.2)     Time: 1610-9604 PT Time Calculation (min) (ACUTE ONLY): 19 min  Charges:  $Gait Training: 8-22 mins                   Danielle Dess, PTA 10/08/22, 9:54 AM

## 2022-10-09 ENCOUNTER — Ambulatory Visit: Payer: 59

## 2022-10-09 DIAGNOSIS — G629 Polyneuropathy, unspecified: Secondary | ICD-10-CM | POA: Diagnosis not present

## 2022-10-09 DIAGNOSIS — M4714 Other spondylosis with myelopathy, thoracic region: Secondary | ICD-10-CM | POA: Diagnosis not present

## 2022-10-09 DIAGNOSIS — J4489 Other specified chronic obstructive pulmonary disease: Secondary | ICD-10-CM | POA: Insufficient documentation

## 2022-10-09 DIAGNOSIS — H541223 Low vision right eye category 2, blindness left eye category 3: Secondary | ICD-10-CM | POA: Diagnosis not present

## 2022-10-09 DIAGNOSIS — K5901 Slow transit constipation: Secondary | ICD-10-CM | POA: Diagnosis not present

## 2022-10-09 DIAGNOSIS — Z743 Need for continuous supervision: Secondary | ICD-10-CM | POA: Diagnosis not present

## 2022-10-09 DIAGNOSIS — R6889 Other general symptoms and signs: Secondary | ICD-10-CM | POA: Diagnosis not present

## 2022-10-09 DIAGNOSIS — M4804 Spinal stenosis, thoracic region: Secondary | ICD-10-CM | POA: Diagnosis not present

## 2022-10-09 DIAGNOSIS — E539 Vitamin B deficiency, unspecified: Secondary | ICD-10-CM | POA: Diagnosis not present

## 2022-10-09 DIAGNOSIS — J449 Chronic obstructive pulmonary disease, unspecified: Secondary | ICD-10-CM | POA: Diagnosis not present

## 2022-10-09 DIAGNOSIS — Z79899 Other long term (current) drug therapy: Secondary | ICD-10-CM | POA: Diagnosis not present

## 2022-10-09 DIAGNOSIS — E119 Type 2 diabetes mellitus without complications: Secondary | ICD-10-CM | POA: Diagnosis not present

## 2022-10-09 DIAGNOSIS — E785 Hyperlipidemia, unspecified: Secondary | ICD-10-CM | POA: Diagnosis not present

## 2022-10-09 DIAGNOSIS — Z7984 Long term (current) use of oral hypoglycemic drugs: Secondary | ICD-10-CM | POA: Diagnosis not present

## 2022-10-09 DIAGNOSIS — Z981 Arthrodesis status: Secondary | ICD-10-CM | POA: Diagnosis not present

## 2022-10-09 DIAGNOSIS — Z4789 Encounter for other orthopedic aftercare: Secondary | ICD-10-CM | POA: Diagnosis not present

## 2022-10-09 LAB — GLUCOSE, CAPILLARY
Glucose-Capillary: 157 mg/dL — ABNORMAL HIGH (ref 70–99)
Glucose-Capillary: 124 mg/dL — ABNORMAL HIGH (ref 70–99)

## 2022-10-09 MED ORDER — OXYCODONE HCL 5 MG PO TABS: 5.0000 mg | ORAL_TABLET | ORAL | 0 refills | Status: AC | PRN

## 2022-10-09 MED ORDER — SENNA 8.6 MG PO TABS: 1.0000 | ORAL_TABLET | Freq: Two times a day (BID) | ORAL | 0 refills | Status: DC

## 2022-10-09 MED ORDER — METHOCARBAMOL 500 MG PO TABS: 500.0000 mg | ORAL_TABLET | Freq: Four times a day (QID) | ORAL | 0 refills | Status: DC | PRN

## 2022-10-09 NOTE — Progress Notes (Signed)
Physical Therapy Treatment Patient Details Name: Julie Wall MRN: [id number] DOB: [birth year] Today's Date: 10/09/2022   History of Present Illness Julie Wall is a 67 y.o female with a history of thoracic stenosis and myelopathy s/p T11-12 decompression and fusion.    PT Comments    In chair, reports increased back pain today but overall ok.  Stood with cues for hand placements and min a x 1 for general steadiness.  She is able to walk 17' with RW and min guard with one episode of LOB to right during turn that required assist to recover that may have resulted in fall without intervention.  She did struggle a bit more with gait today with decreased speed and increased effort.  After seated rest, she does agree to standing ex where she is able to complete about 1/2 of planned ex before R knee buckles and she is assisted to chair without injury.   Will discuss discharge plan with MD.   Recommendations for follow up therapy are one component of a multi-disciplinary discharge planning process, led by the attending physician.  Recommendations may be updated based on patient status, additional functional criteria and insurance authorization.  Follow Up Recommendations       Assistance Recommended at Discharge Frequent or constant Supervision/Assistance  Patient can return home with the following A little help with walking and/or transfers;A little help with bathing/dressing/bathroom;Assistance with cooking/housework;Assist for transportation;Help with stairs or ramp for entrance   Equipment Recommendations       Recommendations for Other Services       Precautions / Restrictions Precautions Precautions: Back Precaution Booklet Issued: Yes (comment) Precaution Comments: educated and reviewed with patient to ensure understanding Restrictions Weight Bearing Restrictions: No     Mobility  Bed Mobility               General bed mobility comments: in recliner before and  after Patient Response: Cooperative  Transfers Overall transfer level: Needs assistance Equipment used: Rolling walker (2 wheels) Transfers: Sit to/from Stand Sit to Stand: Min guard, Min assist           General transfer comment: cues for hand placements and general stability    Ambulation/Gait Ambulation/Gait assistance: Min guard, Min assist Gait Distance (Feet): 75 Feet Assistive device: Rolling walker (2 wheels) Gait Pattern/deviations: Step-through pattern, Trunk flexed, Decreased step length - right, Decreased step length - left Gait velocity: decreased     General Gait Details: one LOB to right today, recovered with min a.  struggled more today due to weakness and fatigue   Stairs             Wheelchair Mobility    Modified Rankin (Stroke Patients Only)       Balance Overall balance assessment: Needs assistance Sitting-balance support: Feet supported Sitting balance-Leahy Scale: Good     Standing balance support: Bilateral upper extremity supported Standing balance-Leahy Scale: Fair Standing balance comment: R LE did buckle today with standing ex                            Cognition Arousal/Alertness: Awake/alert Behavior During Therapy: WFL for tasks assessed/performed Overall Cognitive Status: Within Functional Limits for tasks assessed                                          Exercises Other Exercises Other  Exercises: stadnign marches x 10, SLR x 6 before RLE buckled and she is assisted to chair.  seated AROM x 10    General Comments        Pertinent Vitals/Pain Pain Assessment Pain Assessment: Faces Faces Pain Scale: Hurts even more Pain Location: increased soreness today Pain Descriptors / Indicators: Aching, Sore Pain Intervention(s): Limited activity within patient's tolerance, Monitored during session, Repositioned    Home Living                          Prior Function             PT Goals (current goals can now be found in the care plan section) Progress towards PT goals: Progressing toward goals    Frequency    7X/week      PT Plan Current plan remains appropriate    Co-evaluation              AM-PAC PT "6 Clicks" Mobility   Outcome Measure  Help needed turning from your back to your side while in a flat bed without using bedrails?: A Little Help needed moving from lying on your back to sitting on the side of a flat bed without using bedrails?: A Little Help needed moving to and from a bed to a chair (including a wheelchair)?: A Little Help needed standing up from a chair using your arms (e.g., wheelchair or bedside chair)?: A Little Help needed to walk in hospital room?: A Little Help needed climbing 3-5 steps with a railing? : A Lot 6 Click Score: 17    End of Session Equipment Utilized During Treatment: Gait belt Activity Tolerance: Patient tolerated treatment well;Patient limited by fatigue Patient left: in chair;with call bell/phone within reach;with chair alarm set Nurse Communication: Mobility status PT Visit Diagnosis: Other abnormalities of gait and mobility (R26.89);Muscle weakness (generalized) (M62.81);Difficulty in walking, not elsewhere classified (R26.2)     Time: 0981-1914 PT Time Calculation (min) (ACUTE ONLY): 14 min  Charges:  $Gait Training: 8-22 mins                   Danielle Dess, PTA 10/09/22, 8:50 AM

## 2022-10-09 NOTE — Progress Notes (Addendum)
Progress Note  History: Julie Wall is a 67 y.o female s/p T11-12 decompression and fusion  POD7: NAEO. Pt reports resolution of abdominal pain POD6: Doing well. POD5: Doing well, mobility improving POD4: Pt up with staff going to the bathroom POD3: Pt reporting abdominal pain this morning. She has not had a BM in several days POD2: Improved leg numbness this morning  POD1: expected back pain this morning  Physical Exam: Vitals:   10/09/22 0030 10/09/22 0554  BP: 109/67 107/75  Pulse: 84 66  Resp: 18 16  Temp: 98.4 F (36.9 C) 98.3 F (36.8 C)  SpO2: 97% 99%    AA Ox3 CNI  Strength: MAEW  Incision c/d/I with new dressing in place Data:  Other tests/results:  Abd xray 10/05/22 IMPRESSION: 1. Large amount of gas and stool in the colon particularly on the right side of the abdomen. Findings compatible with constipation. 2. Postsurgical changes in lower thoracic spine.     Electronically Signed   By: Richarda Overlie M.D.   On: 10/05/2022 11:14  Assessment/Plan:  Julie Wall is a 67 y.o with a history of thoracic stenosis and myelopathy s/p T11-12 decompression and fusion  - mobilize - pain control - DVT prophylaxis - PTOT; dispo plan pending evaluation today - aggressive bowel regimen. - continue to monitor BG for DM II with hyperglycemia - HV removed 5/2 - wound vac removed 5/6  Manning Charity PA-C Department of Neurosurgery

## 2022-10-09 NOTE — Discharge Summary (Signed)
Discharge Summary  Patient ID: Julie Wall MRN: [id number] DOB/AGE: [birth year] 67 y.o.  Admit date: 10/02/2022 Discharge date: 10/09/2022  Admission Diagnoses:  Thoracic spondylosis with myelopathy M47.14 , Spinal stenosis, thoracic - M48.04 .  Discharge Diagnoses:  Principal Problem:   S/P spinal fusion Active Problems:   Thoracic spondylosis with myelopathy   Spinal stenosis of thoracic region   Discharged Condition: good  Hospital Course:  Julie Wall is a 67 y.o presenting with thoracic myelopathy s/p T11-T12 decompression and fusion. Her intraoperative course was uncomplicated. She was admitted for drain output monitoring, pain management and therapy evaluation. Her hospital course was complicated by constipation which resolved with medication changes. She was deemed appropriate for discharge to SNF on POD7.   Consults: None  Significant Diagnostic Studies: radiology: X-Ray: Abd xray 10/05/22 Abd xray 10/05/22 IMPRESSION: 1. Large amount of gas and stool in the colon particularly on the right side of the abdomen. Findings compatible with constipation. 2. Postsurgical changes in lower thoracic spine.     Electronically Signed   By: Richarda Overlie M.D.   On: 10/05/2022 11:14  Treatments: surgery: as above. Please see separately dictated operative report for further details  Discharge Exam: Blood pressure 118/60, pulse 74, temperature 97.9 F (36.6 C), resp. rate 19, height 4\' 11"  (1.499 m), weight 73 kg, SpO2 98 %.  AA Ox3 CNI   Strength: MAEW   Incision c/d/I with new dressing in place  Disposition: Discharge disposition: 03-Skilled Nursing Facility       Discharge Instructions     Incentive spirometry RT   Complete by: As directed    Remove dressing in 24 hours   Complete by: As directed       Allergies as of 10/09/2022       Reactions   Penicillins Rash        Medication List     STOP taking these medications    aspirin 81 MG chewable tablet    HYDROcodone-acetaminophen 5-325 MG tablet Commonly known as: NORCO/VICODIN       TAKE these medications    Accu-Chek Aviva Plus test strip Generic drug: glucose blood CHECK BLOOD SUGAR ONCE DAILY   albuterol 108 (90 Base) MCG/ACT inhaler Commonly known as: VENTOLIN HFA Inhale 2 puffs into the lungs every 6 (six) hours as needed for wheezing or shortness of breath.   Alcohol Prep 70 % Pads CHECK BLOOD SUGAR 2 TIMES A DAY   AquaLance Lancets 30G Misc CHECK BLOOD SUGAR 2 TIMES A DAY   atorvastatin 40 MG tablet Commonly known as: LIPITOR Take 1 tablet (40 mg total) by mouth daily.   B-12 1000 MCG Subl Place 1 tablet under the tongue daily.   Cholecalciferol 50 MCG (2000 UT) Caps Take 2,000 Units by mouth daily.   clobetasol ointment 0.05 % Commonly known as: TEMOVATE Apply 1 application  topically 2 (two) times daily.   dorzolamide-timolol 2-0.5 % ophthalmic solution Commonly known as: COSOPT Place 1 drop into both eyes 2 (two) times daily.   empagliflozin 25 MG Tabs tablet Commonly known as: Jardiance Take 1 tablet (25 mg total) by mouth daily.   folic acid 1 MG tablet Commonly known as: FOLVITE Take 1 mg by mouth daily. Does not take on Friday   gabapentin 300 MG capsule Commonly known as: NEURONTIN Take 1 capsule by mouth 2 (two) times daily.   metFORMIN 750 MG 24 hr tablet Commonly known as: GLUCOPHAGE-XR TAKE 2 TABLETS (1,500 MG TOTAL) BY MOUTHDAILY WITH BREAKFAST  methocarbamol 500 MG tablet Commonly known as: ROBAXIN Take 1 tablet (500 mg total) by mouth every 6 (six) hours as needed for muscle spasms.   methotrexate 2.5 MG tablet Commonly known as: RHEUMATREX Take 2.5 mg by mouth every Friday. Take 7 tablets every friday   oxyCODONE 5 MG immediate release tablet Commonly known as: Oxy IR/ROXICODONE Take 1 tablet (5 mg total) by mouth every 4 (four) hours as needed for up to 7 days for moderate pain or severe pain ((score 4 to 6)).   senna 8.6  MG Tabs tablet Commonly known as: SENOKOT Take 1 tablet (8.6 mg total) by mouth 2 (two) times daily.   Simple Diagnostics Lancing Dev Misc Check blood sugar once daily fasting, and up to 4 times daily PRN.   Trelegy Ellipta 200-62.5-25 MCG/ACT Aepb Generic drug: Fluticasone-Umeclidin-Vilant Inhale 1 puff into the lungs daily.   triamcinolone ointment 0.1 % Commonly known as: KENALOG Apply topically 2 (two) times daily.        Follow-up Information     Susanne Borders, PA Follow up on 10/19/2022.   Specialty: Neurosurgery Contact information: [address] [place] [address] [phone]                 Signed: Susanne Borders 10/09/2022, 12:39 PM

## 2022-10-09 NOTE — TOC Transition Note (Signed)
Transition of Care [facility]) - CM/SW Discharge Note   Patient Details  Name: Julie Wall MRN: [id number] Date of Birth: [birth year]  Transition of Care [facility]) CM/SW Contact:  Garret Reddish, RN Phone Number: 10/09/2022, 2:18 PM   Clinical Narrative:  Chart reviewed.  Patient has orders for discharge today.  I have spoken with Elmarie Shiley, Admissions Director at [facility].  She reports that she will have a bed for patient today.  I have informed Tiffany that patient has SNF approval.  SNF approval is: Plan Auth [id number].  Auth ID- [id number].    Tiffany reports that patient will be going to room 604 and the number to call report is [phone].    I have arranged  [facility] to transport patient to the facility today. I have informed patient of the above information.    I have informed staff nurse of the above information.      Final next level of care: Skilled Nursing Facility Barriers to Discharge: No Barriers Identified   Patient Goals and CMS Choice   Choice offered to / list presented to : Patient  Discharge Placement                Patient chooses bed at: [facility] Patient to be transferred to facility by: [facility] Name of family member notified: Patient reports that she will make family aware of transport to facility today. Patient and family notified of of transfer: 10/09/22  Discharge Plan and Services Additional resources added to the After Visit Summary for     Discharge Planning Services: CM Consult Post Acute Care Choice:  (Patient would like to discharge to [facility] on discharge.  Will completed a bed search.)                               Social Determinants of Health (SDOH) Interventions SDOH Screenings   Food Insecurity: No Food Insecurity (10/02/2022)  Housing: Low Risk  (10/02/2022)  Transportation Needs: No Transportation Needs (10/02/2022)  Utilities: Not At Risk (10/02/2022)  Alcohol Screen: Low Risk   (07/20/2022)  Depression (PHQ2-9): Low Risk  (09/18/2022)  Financial Resource Strain: Low Risk  (07/20/2022)  Physical Activity: Sufficiently Active (07/20/2022)  Recent Concern: Physical Activity - Insufficiently Active (05/25/2022)  Social Connections: Moderately Isolated (07/20/2022)  Stress: No Stress Concern Present (07/20/2022)  Tobacco Use: High Risk (10/03/2022)     Readmission Risk Interventions     No data to display

## 2022-10-09 NOTE — Care Management Important Message (Signed)
Important Message  Patient Details  Name: Julie Wall MRN: [id number] Date of Birth: 1956/01/24   Medicare Important Message Given:  Yes     [name] 10/09/2022, 1:19 PM

## 2022-10-10 ENCOUNTER — Telehealth: Payer: Self-pay | Admitting: Family Medicine

## 2022-10-10 NOTE — Telephone Encounter (Signed)
Copied from CRM [id number]. Topic: General - Other >> Oct 10, 2022 12:41 PM Turkey B wrote: Reason for CRM: Ladell Pier from [facility] called in states needs pt's, colonoscopy date,  vacinnation and flu dates. Please call back

## 2022-10-11 DIAGNOSIS — M4714 Other spondylosis with myelopathy, thoracic region: Secondary | ICD-10-CM | POA: Diagnosis not present

## 2022-10-11 DIAGNOSIS — M4804 Spinal stenosis, thoracic region: Secondary | ICD-10-CM | POA: Diagnosis not present

## 2022-10-11 DIAGNOSIS — E539 Vitamin B deficiency, unspecified: Secondary | ICD-10-CM | POA: Diagnosis not present

## 2022-10-11 DIAGNOSIS — E785 Hyperlipidemia, unspecified: Secondary | ICD-10-CM | POA: Diagnosis not present

## 2022-10-11 DIAGNOSIS — J449 Chronic obstructive pulmonary disease, unspecified: Secondary | ICD-10-CM | POA: Diagnosis not present

## 2022-10-11 DIAGNOSIS — H541223 Low vision right eye category 2, blindness left eye category 3: Secondary | ICD-10-CM | POA: Diagnosis not present

## 2022-10-11 DIAGNOSIS — G629 Polyneuropathy, unspecified: Secondary | ICD-10-CM | POA: Diagnosis not present

## 2022-10-11 NOTE — Telephone Encounter (Signed)
Returned call to Ladell Pier and gave dates of last colonoscopy, last A1c, and flu vaccine. She stated pt told her she has her cologuard from 5 months ago, but has yet to complete it and send it back.

## 2022-10-11 NOTE — Telephone Encounter (Signed)
Ladell Pier called back reporting that she never received a call, please advise. Says she needs to close her chart within the next 24 hours.   Best contact: [phone]

## 2022-10-13 DIAGNOSIS — J449 Chronic obstructive pulmonary disease, unspecified: Secondary | ICD-10-CM | POA: Diagnosis not present

## 2022-10-13 DIAGNOSIS — M4804 Spinal stenosis, thoracic region: Secondary | ICD-10-CM | POA: Diagnosis not present

## 2022-10-13 DIAGNOSIS — M4714 Other spondylosis with myelopathy, thoracic region: Secondary | ICD-10-CM | POA: Diagnosis not present

## 2022-10-13 DIAGNOSIS — E785 Hyperlipidemia, unspecified: Secondary | ICD-10-CM | POA: Diagnosis not present

## 2022-10-13 DIAGNOSIS — K5901 Slow transit constipation: Secondary | ICD-10-CM | POA: Diagnosis not present

## 2022-10-13 DIAGNOSIS — E539 Vitamin B deficiency, unspecified: Secondary | ICD-10-CM | POA: Diagnosis not present

## 2022-10-16 DIAGNOSIS — M4714 Other spondylosis with myelopathy, thoracic region: Secondary | ICD-10-CM | POA: Diagnosis not present

## 2022-10-16 DIAGNOSIS — H541223 Low vision right eye category 2, blindness left eye category 3: Secondary | ICD-10-CM | POA: Diagnosis not present

## 2022-10-16 DIAGNOSIS — E785 Hyperlipidemia, unspecified: Secondary | ICD-10-CM | POA: Diagnosis not present

## 2022-10-16 DIAGNOSIS — J449 Chronic obstructive pulmonary disease, unspecified: Secondary | ICD-10-CM | POA: Diagnosis not present

## 2022-10-16 DIAGNOSIS — G629 Polyneuropathy, unspecified: Secondary | ICD-10-CM | POA: Diagnosis not present

## 2022-10-16 DIAGNOSIS — M4804 Spinal stenosis, thoracic region: Secondary | ICD-10-CM | POA: Diagnosis not present

## 2022-10-16 DIAGNOSIS — K5901 Slow transit constipation: Secondary | ICD-10-CM | POA: Diagnosis not present

## 2022-10-17 DIAGNOSIS — E539 Vitamin B deficiency, unspecified: Secondary | ICD-10-CM | POA: Diagnosis not present

## 2022-10-17 DIAGNOSIS — E785 Hyperlipidemia, unspecified: Secondary | ICD-10-CM | POA: Diagnosis not present

## 2022-10-17 DIAGNOSIS — G629 Polyneuropathy, unspecified: Secondary | ICD-10-CM | POA: Diagnosis not present

## 2022-10-17 DIAGNOSIS — M4714 Other spondylosis with myelopathy, thoracic region: Secondary | ICD-10-CM | POA: Diagnosis not present

## 2022-10-17 DIAGNOSIS — E119 Type 2 diabetes mellitus without complications: Secondary | ICD-10-CM | POA: Diagnosis not present

## 2022-10-17 DIAGNOSIS — M4804 Spinal stenosis, thoracic region: Secondary | ICD-10-CM | POA: Diagnosis not present

## 2022-10-17 DIAGNOSIS — J449 Chronic obstructive pulmonary disease, unspecified: Secondary | ICD-10-CM | POA: Diagnosis not present

## 2022-10-19 ENCOUNTER — Encounter: Payer: Self-pay | Admitting: Neurosurgery

## 2022-10-19 ENCOUNTER — Ambulatory Visit (INDEPENDENT_AMBULATORY_CARE_PROVIDER_SITE_OTHER): Payer: 59 | Admitting: Neurosurgery

## 2022-10-19 VITALS — BP 118/70 | Temp 98.3°F | Ht 59.0 in | Wt 161.0 lb

## 2022-10-19 DIAGNOSIS — M4805 Spinal stenosis, thoracolumbar region: Secondary | ICD-10-CM | POA: Diagnosis not present

## 2022-10-19 DIAGNOSIS — M4804 Spinal stenosis, thoracic region: Secondary | ICD-10-CM

## 2022-10-19 DIAGNOSIS — E119 Type 2 diabetes mellitus without complications: Secondary | ICD-10-CM | POA: Diagnosis not present

## 2022-10-19 DIAGNOSIS — M6281 Muscle weakness (generalized): Secondary | ICD-10-CM | POA: Diagnosis not present

## 2022-10-19 DIAGNOSIS — M4714 Other spondylosis with myelopathy, thoracic region: Secondary | ICD-10-CM | POA: Diagnosis not present

## 2022-10-19 DIAGNOSIS — Z981 Arthrodesis status: Secondary | ICD-10-CM | POA: Diagnosis not present

## 2022-10-19 DIAGNOSIS — M4800 Spinal stenosis, site unspecified: Secondary | ICD-10-CM | POA: Diagnosis not present

## 2022-10-19 DIAGNOSIS — Z79891 Long term (current) use of opiate analgesic: Secondary | ICD-10-CM | POA: Diagnosis not present

## 2022-10-19 DIAGNOSIS — J449 Chronic obstructive pulmonary disease, unspecified: Secondary | ICD-10-CM | POA: Diagnosis not present

## 2022-10-19 DIAGNOSIS — Z4789 Encounter for other orthopedic aftercare: Secondary | ICD-10-CM | POA: Diagnosis not present

## 2022-10-19 DIAGNOSIS — Z7984 Long term (current) use of oral hypoglycemic drugs: Secondary | ICD-10-CM | POA: Diagnosis not present

## 2022-10-19 DIAGNOSIS — Z09 Encounter for follow-up examination after completed treatment for conditions other than malignant neoplasm: Secondary | ICD-10-CM

## 2022-10-19 NOTE — Addendum Note (Signed)
Addended by: Ernie Hew on: 10/19/2022 02:24 PM   Modules accepted: Orders

## 2022-10-19 NOTE — Progress Notes (Signed)
   REFERRING PHYSICIAN:  Alba Cory, Md [address] [place],  [place] [address]  DOS: 10/02/22 T11-12 PSFD  HISTORY OF PRESENT ILLNESS: Delpha Kernes is approximately 2 weeks status post thoracic decompression and fusion. she is doing well.  She does have some mild discomfort in her back but overall states she is tolerating this well.  She states that the symptoms in her legs have gotten significantly better.  She does continue to have some intermittent numbness particularly in the bottom of her right foot with ambulating for prolonged periods of time.  She has since been discharged from skilled nursing and is at home with physical therapy twice weekly.  She denies any incisional concerns  PHYSICAL EXAMINATION:  General: Patient is well developed, well nourished, calm, collected, and in no apparent distress.   NEUROLOGICAL:  General: In no acute distress.   Awake, alert, oriented to person, place, and time.  Strength:            Side Iliopsoas Quads Hamstring PF DF EHL  R 5 5 5 5 5 5   L 5 5 5 5 5 5    Incision c/d/i and healing well   ROS (Neurologic):  Negative except as noted above  IMAGING: No interval imaging to review  ASSESSMENT/PLAN:  Jaline Giangrande is doing well approximately 2 weeks after thoracic decompression and fusion. We discussed activity escalation and I have advised the patient to lift up to 10 pounds until 6 weeks after surgery, then increase up to 25 pounds until 12 weeks after surgery.  After 12 weeks post-op, the patient advised to increase activity as tolerated. she will follow up in 4 weeks with Dr. Myer Haff or sooner should she have any questions or concerns.   Advised to contact the office if any questions or concerns arise.  Manning Charity PA-C Department of neurosurgery

## 2022-10-24 DIAGNOSIS — M4714 Other spondylosis with myelopathy, thoracic region: Secondary | ICD-10-CM | POA: Diagnosis not present

## 2022-10-24 DIAGNOSIS — M6281 Muscle weakness (generalized): Secondary | ICD-10-CM | POA: Diagnosis not present

## 2022-10-24 DIAGNOSIS — J449 Chronic obstructive pulmonary disease, unspecified: Secondary | ICD-10-CM | POA: Diagnosis not present

## 2022-10-24 DIAGNOSIS — E119 Type 2 diabetes mellitus without complications: Secondary | ICD-10-CM | POA: Diagnosis not present

## 2022-10-24 DIAGNOSIS — M4800 Spinal stenosis, site unspecified: Secondary | ICD-10-CM | POA: Diagnosis not present

## 2022-10-24 DIAGNOSIS — M4805 Spinal stenosis, thoracolumbar region: Secondary | ICD-10-CM | POA: Diagnosis not present

## 2022-10-24 DIAGNOSIS — Z79891 Long term (current) use of opiate analgesic: Secondary | ICD-10-CM | POA: Diagnosis not present

## 2022-10-24 DIAGNOSIS — Z7984 Long term (current) use of oral hypoglycemic drugs: Secondary | ICD-10-CM | POA: Diagnosis not present

## 2022-10-24 DIAGNOSIS — Z981 Arthrodesis status: Secondary | ICD-10-CM | POA: Diagnosis not present

## 2022-10-24 DIAGNOSIS — Z4789 Encounter for other orthopedic aftercare: Secondary | ICD-10-CM | POA: Diagnosis not present

## 2022-10-31 ENCOUNTER — Telehealth: Payer: Self-pay | Admitting: Family Medicine

## 2022-10-31 DIAGNOSIS — M4800 Spinal stenosis, site unspecified: Secondary | ICD-10-CM | POA: Diagnosis not present

## 2022-10-31 DIAGNOSIS — Z79891 Long term (current) use of opiate analgesic: Secondary | ICD-10-CM | POA: Diagnosis not present

## 2022-10-31 DIAGNOSIS — Z4789 Encounter for other orthopedic aftercare: Secondary | ICD-10-CM | POA: Diagnosis not present

## 2022-10-31 DIAGNOSIS — Z981 Arthrodesis status: Secondary | ICD-10-CM | POA: Diagnosis not present

## 2022-10-31 DIAGNOSIS — M4805 Spinal stenosis, thoracolumbar region: Secondary | ICD-10-CM | POA: Diagnosis not present

## 2022-10-31 DIAGNOSIS — M6281 Muscle weakness (generalized): Secondary | ICD-10-CM | POA: Diagnosis not present

## 2022-10-31 DIAGNOSIS — J449 Chronic obstructive pulmonary disease, unspecified: Secondary | ICD-10-CM | POA: Diagnosis not present

## 2022-10-31 DIAGNOSIS — E119 Type 2 diabetes mellitus without complications: Secondary | ICD-10-CM | POA: Diagnosis not present

## 2022-10-31 DIAGNOSIS — M4714 Other spondylosis with myelopathy, thoracic region: Secondary | ICD-10-CM | POA: Diagnosis not present

## 2022-10-31 DIAGNOSIS — Z7984 Long term (current) use of oral hypoglycemic drugs: Secondary | ICD-10-CM | POA: Diagnosis not present

## 2022-10-31 NOTE — Telephone Encounter (Signed)
Verbal orders given  

## 2022-10-31 NOTE — Telephone Encounter (Signed)
Copied from CRM [id number]. Topic: General - Other >> Oct 31, 2022  1:23 PM Alfred Levins wrote: Home Health Verbal Orders - Caller/Agency: Tommi Rumps Number: [phone] Requesting OT  Frequency:  1x 5wks  Exercise, activity, ADLS,  IADLS,

## 2022-11-01 DIAGNOSIS — J449 Chronic obstructive pulmonary disease, unspecified: Secondary | ICD-10-CM | POA: Diagnosis not present

## 2022-11-01 DIAGNOSIS — M4805 Spinal stenosis, thoracolumbar region: Secondary | ICD-10-CM | POA: Diagnosis not present

## 2022-11-01 DIAGNOSIS — Z4789 Encounter for other orthopedic aftercare: Secondary | ICD-10-CM | POA: Diagnosis not present

## 2022-11-01 DIAGNOSIS — M6281 Muscle weakness (generalized): Secondary | ICD-10-CM | POA: Diagnosis not present

## 2022-11-01 DIAGNOSIS — M4800 Spinal stenosis, site unspecified: Secondary | ICD-10-CM | POA: Diagnosis not present

## 2022-11-01 DIAGNOSIS — Z981 Arthrodesis status: Secondary | ICD-10-CM | POA: Diagnosis not present

## 2022-11-01 DIAGNOSIS — Z79891 Long term (current) use of opiate analgesic: Secondary | ICD-10-CM | POA: Diagnosis not present

## 2022-11-01 DIAGNOSIS — E119 Type 2 diabetes mellitus without complications: Secondary | ICD-10-CM | POA: Diagnosis not present

## 2022-11-01 DIAGNOSIS — Z7984 Long term (current) use of oral hypoglycemic drugs: Secondary | ICD-10-CM | POA: Diagnosis not present

## 2022-11-01 DIAGNOSIS — M4714 Other spondylosis with myelopathy, thoracic region: Secondary | ICD-10-CM | POA: Diagnosis not present

## 2022-11-02 DIAGNOSIS — H40003 Preglaucoma, unspecified, bilateral: Secondary | ICD-10-CM | POA: Diagnosis not present

## 2022-11-07 DIAGNOSIS — M4805 Spinal stenosis, thoracolumbar region: Secondary | ICD-10-CM | POA: Diagnosis not present

## 2022-11-07 DIAGNOSIS — Z961 Presence of intraocular lens: Secondary | ICD-10-CM | POA: Diagnosis not present

## 2022-11-07 DIAGNOSIS — Z9889 Other specified postprocedural states: Secondary | ICD-10-CM | POA: Diagnosis not present

## 2022-11-07 DIAGNOSIS — Z4789 Encounter for other orthopedic aftercare: Secondary | ICD-10-CM | POA: Diagnosis not present

## 2022-11-07 DIAGNOSIS — J449 Chronic obstructive pulmonary disease, unspecified: Secondary | ICD-10-CM | POA: Diagnosis not present

## 2022-11-07 DIAGNOSIS — Z981 Arthrodesis status: Secondary | ICD-10-CM | POA: Diagnosis not present

## 2022-11-07 DIAGNOSIS — E119 Type 2 diabetes mellitus without complications: Secondary | ICD-10-CM | POA: Diagnosis not present

## 2022-11-07 DIAGNOSIS — Z7984 Long term (current) use of oral hypoglycemic drugs: Secondary | ICD-10-CM | POA: Diagnosis not present

## 2022-11-07 DIAGNOSIS — H40003 Preglaucoma, unspecified, bilateral: Secondary | ICD-10-CM | POA: Diagnosis not present

## 2022-11-07 DIAGNOSIS — M4800 Spinal stenosis, site unspecified: Secondary | ICD-10-CM | POA: Diagnosis not present

## 2022-11-07 DIAGNOSIS — Z79891 Long term (current) use of opiate analgesic: Secondary | ICD-10-CM | POA: Diagnosis not present

## 2022-11-07 DIAGNOSIS — M6281 Muscle weakness (generalized): Secondary | ICD-10-CM | POA: Diagnosis not present

## 2022-11-07 DIAGNOSIS — M4714 Other spondylosis with myelopathy, thoracic region: Secondary | ICD-10-CM | POA: Diagnosis not present

## 2022-11-07 LAB — HM DIABETES EYE EXAM

## 2022-11-09 DIAGNOSIS — Z7984 Long term (current) use of oral hypoglycemic drugs: Secondary | ICD-10-CM | POA: Diagnosis not present

## 2022-11-09 DIAGNOSIS — M4805 Spinal stenosis, thoracolumbar region: Secondary | ICD-10-CM | POA: Diagnosis not present

## 2022-11-09 DIAGNOSIS — J449 Chronic obstructive pulmonary disease, unspecified: Secondary | ICD-10-CM | POA: Diagnosis not present

## 2022-11-09 DIAGNOSIS — Z981 Arthrodesis status: Secondary | ICD-10-CM | POA: Diagnosis not present

## 2022-11-09 DIAGNOSIS — Z4789 Encounter for other orthopedic aftercare: Secondary | ICD-10-CM | POA: Diagnosis not present

## 2022-11-09 DIAGNOSIS — M6281 Muscle weakness (generalized): Secondary | ICD-10-CM | POA: Diagnosis not present

## 2022-11-09 DIAGNOSIS — M4714 Other spondylosis with myelopathy, thoracic region: Secondary | ICD-10-CM | POA: Diagnosis not present

## 2022-11-09 DIAGNOSIS — M4800 Spinal stenosis, site unspecified: Secondary | ICD-10-CM | POA: Diagnosis not present

## 2022-11-09 DIAGNOSIS — E119 Type 2 diabetes mellitus without complications: Secondary | ICD-10-CM | POA: Diagnosis not present

## 2022-11-09 DIAGNOSIS — Z79891 Long term (current) use of opiate analgesic: Secondary | ICD-10-CM | POA: Diagnosis not present

## 2022-11-13 ENCOUNTER — Other Ambulatory Visit: Payer: Self-pay

## 2022-11-13 DIAGNOSIS — M4714 Other spondylosis with myelopathy, thoracic region: Secondary | ICD-10-CM

## 2022-11-14 ENCOUNTER — Ambulatory Visit (INDEPENDENT_AMBULATORY_CARE_PROVIDER_SITE_OTHER): Payer: 59 | Admitting: Neurosurgery

## 2022-11-14 ENCOUNTER — Ambulatory Visit
Admission: RE | Admit: 2022-11-14 | Discharge: 2022-11-14 | Disposition: A | Discharge status: Home / Self Care | Payer: 59 | Attending: Neurosurgery | Admitting: Neurosurgery

## 2022-11-14 ENCOUNTER — Encounter: Payer: Self-pay | Admitting: Neurosurgery

## 2022-11-14 ENCOUNTER — Ambulatory Visit
Admission: RE | Admit: 2022-11-14 | Discharge: 2022-11-14 | Disposition: A | Discharge status: Home / Self Care | Payer: 59 | Source: Ambulatory Visit | Attending: Neurosurgery | Admitting: Neurosurgery

## 2022-11-14 VITALS — BP 130/72 | Temp 98.5°F | Ht 59.0 in | Wt 161.0 lb

## 2022-11-14 DIAGNOSIS — Z09 Encounter for follow-up examination after completed treatment for conditions other than malignant neoplasm: Secondary | ICD-10-CM

## 2022-11-14 DIAGNOSIS — M4714 Other spondylosis with myelopathy, thoracic region: Secondary | ICD-10-CM

## 2022-11-14 DIAGNOSIS — M4324 Fusion of spine, thoracic region: Secondary | ICD-10-CM | POA: Diagnosis not present

## 2022-11-14 DIAGNOSIS — Z981 Arthrodesis status: Secondary | ICD-10-CM | POA: Diagnosis not present

## 2022-11-14 DIAGNOSIS — M4804 Spinal stenosis, thoracic region: Secondary | ICD-10-CM

## 2022-11-14 MED ORDER — GABAPENTIN 300 MG PO CAPS: 300.0000 mg | ORAL_CAPSULE | Freq: Two times a day (BID) | ORAL | 0 refills | Status: DC

## 2022-11-14 MED ORDER — OXYCODONE HCL 5 MG PO TABS: 5.0000 mg | ORAL_TABLET | ORAL | 0 refills | Status: AC | PRN

## 2022-11-14 MED ORDER — METHOCARBAMOL 500 MG PO TABS: 500.0000 mg | ORAL_TABLET | Freq: Four times a day (QID) | ORAL | 0 refills | Status: DC | PRN

## 2022-11-14 NOTE — Progress Notes (Signed)
   REFERRING PHYSICIAN:  Alba Cory, Md [address] [place],  [place] [address]  DOS: 10/02/22 T11-12 PSFD  HISTORY OF PRESENT ILLNESS: Julie Wall is status post thoracic decompression and fusion.  She has had a permanent imbalance.  She is no longer having severe pain down the right leg.  She is pleased with her improvements.  She has not smoked since surgery.   PHYSICAL EXAMINATION:  General: Patient is well developed, well nourished, calm, collected, and in no apparent distress.   NEUROLOGICAL:  General: In no acute distress.   Awake, alert, oriented to person, place, and time.  Strength:            Side Iliopsoas Quads Hamstring PF DF EHL  R 5 5 5 5 5 5   L 5 5 5 5 5 5    Incision c/d/i with a small area of dehiscence at the inferior most portion of the incision   ROS (Neurologic):  Negative except as noted above  IMAGING: No complications noted  ASSESSMENT/PLAN:  Julie Wall is doing well after thoracic decompression and fusion.  She is not very symptomatic from her lower back at this point.  We will hold off on further intervention.  We discussed her activity limitations.  Will give her some Medihoney to help with her wound healing.  I will refill her medications.    Venetia Night MD Department of neurosurgery

## 2022-11-15 ENCOUNTER — Telehealth: Payer: Self-pay | Admitting: Family Medicine

## 2022-11-15 DIAGNOSIS — M6281 Muscle weakness (generalized): Secondary | ICD-10-CM | POA: Diagnosis not present

## 2022-11-15 DIAGNOSIS — M4805 Spinal stenosis, thoracolumbar region: Secondary | ICD-10-CM | POA: Diagnosis not present

## 2022-11-15 DIAGNOSIS — Z79891 Long term (current) use of opiate analgesic: Secondary | ICD-10-CM | POA: Diagnosis not present

## 2022-11-15 DIAGNOSIS — J449 Chronic obstructive pulmonary disease, unspecified: Secondary | ICD-10-CM | POA: Diagnosis not present

## 2022-11-15 DIAGNOSIS — M4714 Other spondylosis with myelopathy, thoracic region: Secondary | ICD-10-CM | POA: Diagnosis not present

## 2022-11-15 DIAGNOSIS — M4800 Spinal stenosis, site unspecified: Secondary | ICD-10-CM | POA: Diagnosis not present

## 2022-11-15 DIAGNOSIS — Z7984 Long term (current) use of oral hypoglycemic drugs: Secondary | ICD-10-CM | POA: Diagnosis not present

## 2022-11-15 DIAGNOSIS — Z981 Arthrodesis status: Secondary | ICD-10-CM | POA: Diagnosis not present

## 2022-11-15 DIAGNOSIS — E119 Type 2 diabetes mellitus without complications: Secondary | ICD-10-CM | POA: Diagnosis not present

## 2022-11-15 DIAGNOSIS — Z4789 Encounter for other orthopedic aftercare: Secondary | ICD-10-CM | POA: Diagnosis not present

## 2022-11-15 NOTE — Telephone Encounter (Signed)
Copied from CRM [id number]. Topic: General - Other >> Nov 15, 2022  1:31 PM Ja-Kwan M wrote: Reason for CRM: Darolyn Rua with [facility] reports that pt needs a glucose monitor. Cb# [phone]

## 2022-11-16 ENCOUNTER — Other Ambulatory Visit: Payer: Self-pay

## 2022-11-16 DIAGNOSIS — E1169 Type 2 diabetes mellitus with other specified complication: Secondary | ICD-10-CM

## 2022-11-16 MED ORDER — BLOOD GLUCOSE MONITORING SUPPL DEVI
1.0000 | Freq: Three times a day (TID) | 0 refills | Status: DC
Start: 2022-11-16 — End: 2023-12-18

## 2022-11-17 DIAGNOSIS — Z7984 Long term (current) use of oral hypoglycemic drugs: Secondary | ICD-10-CM | POA: Diagnosis not present

## 2022-11-17 DIAGNOSIS — E119 Type 2 diabetes mellitus without complications: Secondary | ICD-10-CM | POA: Diagnosis not present

## 2022-11-17 DIAGNOSIS — M6281 Muscle weakness (generalized): Secondary | ICD-10-CM | POA: Diagnosis not present

## 2022-11-17 DIAGNOSIS — M4714 Other spondylosis with myelopathy, thoracic region: Secondary | ICD-10-CM | POA: Diagnosis not present

## 2022-11-17 DIAGNOSIS — J449 Chronic obstructive pulmonary disease, unspecified: Secondary | ICD-10-CM | POA: Diagnosis not present

## 2022-11-17 DIAGNOSIS — M4800 Spinal stenosis, site unspecified: Secondary | ICD-10-CM | POA: Diagnosis not present

## 2022-11-17 DIAGNOSIS — Z981 Arthrodesis status: Secondary | ICD-10-CM | POA: Diagnosis not present

## 2022-11-17 DIAGNOSIS — Z79891 Long term (current) use of opiate analgesic: Secondary | ICD-10-CM | POA: Diagnosis not present

## 2022-11-17 DIAGNOSIS — M4805 Spinal stenosis, thoracolumbar region: Secondary | ICD-10-CM | POA: Diagnosis not present

## 2022-11-17 DIAGNOSIS — Z4789 Encounter for other orthopedic aftercare: Secondary | ICD-10-CM | POA: Diagnosis not present

## 2022-11-22 ENCOUNTER — Encounter: Payer: Self-pay | Admitting: Pulmonary Disease

## 2022-11-22 ENCOUNTER — Ambulatory Visit (INDEPENDENT_AMBULATORY_CARE_PROVIDER_SITE_OTHER): Payer: 59 | Admitting: Pulmonary Disease

## 2022-11-22 VITALS — BP 118/70 | HR 91 | Temp 98.3°F | Ht 59.0 in | Wt 162.0 lb

## 2022-11-22 DIAGNOSIS — J849 Interstitial pulmonary disease, unspecified: Secondary | ICD-10-CM | POA: Diagnosis not present

## 2022-11-22 DIAGNOSIS — J4489 Other specified chronic obstructive pulmonary disease: Secondary | ICD-10-CM

## 2022-11-22 DIAGNOSIS — Z87891 Personal history of nicotine dependence: Secondary | ICD-10-CM

## 2022-11-22 NOTE — Progress Notes (Signed)
Subjective:    Patient ID: Julie Wall, female    DOB: March 05, 1956, 67 y.o.   MRN: [id number]  Patient Care Team: Alba Cory, MD as PCP - General (Family Medicine) Herschel Senegal, MD as Referring Physician (Dermatology) Lonell Face, MD as Consulting Physician (Neurology) Gaspar Cola, [facility] (Inactive) as Pharmacist (Pharmacist) Juanell Fairly, RN as Case Manager  Chief Complaint  Patient presents with   Follow-up    Breathing is good. No SOB, wheezing or cough.     HPI [name] is a 67 year old recent former smoker with a 46-pack-year history of smoking and a history as noted below, who presents for follow-up on the issue of COPD/asthma overlap, and interstitial lung disease.  The patient was last seen on 10 August 2022.  At that time she was instructed to obtain new Trelegy 1 puff daily and also to continue her efforts to quit smoking.  She quit smoking on 30 Oct 2022. She had lung cancer screening CT performed 12 June 2022 where no new significant pulmonary nodules were noted.  He had moderate patchy confluent basilar subpleural reticulation and groundglass opacity of both lungs that was ill characterized (interstitial lung abnormality, ILA).  She has now returned to yearly screening having had a few scans following on various nodules.  She does not endorse any worsening shortness of breath, wheezing or cough.  Her breathing is "fine".  She does use albuterol several times a week for shortness of breath.  She is on Trelegy Ellipta and notices that this helps her breathing.  She underwent surgery for spinal stenosis on 02 October 2022 and is recovering well from that.  She is currently on therapy after surgery.  We have proposed getting blood work to evaluate her interstitial lung disease however she continues to want to postpone this.  She has had no fevers, chills or sweats since her last visit.  No cough or sputum production.  Shortness of breath markedly improved from the  Trelegy.  No orthopnea or paroxysmal nocturnal dyspnea.  No lower extremity edema or calf tenderness.   Previously she was noted to have obstructive sleep apnea classified as moderate.  CPAP was ordered however the patient never went to get the equipment.  And is not interested in using CPAP.  She is on methotrexate for severe atopic dermatitis.   Review of Systems A 10 point review of systems was performed and it is as noted above otherwise negative.   Patient Active Problem List   Diagnosis Date Noted   Thoracic spondylosis with myelopathy 10/02/2022   Spinal stenosis of thoracic region 10/02/2022   S/P spinal fusion 10/02/2022   B12 deficiency 09/27/2021   Hypertension associated with diabetes (HCC) 09/27/2021   Diabetic polyneuropathy associated with type 2 diabetes mellitus (HCC)    Hilar lymphadenopathy 02/05/2020   History of COVID-19 02/05/2020   Atherosclerosis of aorta (HCC) 05/04/2017   Pap smear abnormality of cervix with ASCUS favoring benign 04/23/2017   Vitamin D deficiency 12/14/2016   Obesity (BMI 30.0-34.9) 12/13/2015   Chronic bronchitis (HCC) 05/14/2015   Diabetes mellitus (HCC) 05/14/2015   Positive H. pylori test 04/05/2015   Rapid urease test for Helicobacter pylori infection positive 04/05/2015   Umbilical hernia 04/01/2015   Exomphalos 04/01/2015   RLS (restless legs syndrome) 01/08/2015   Type 2 diabetes mellitus with hyperlipidemia (HCC) 01/08/2015   Neurogenic claudication 01/08/2015   AD (atopic dermatitis) 12/28/2014   Chronic LBP 12/28/2014   Dyslipidemia 12/28/2014   Gastroesophageal  reflux disease without esophagitis 12/28/2014   Microalbuminuria 12/28/2014   Disorder of bone and cartilage 12/28/2014   Neuralgia of left thigh 12/28/2014   Perennial allergic rhinitis with seasonal variation 12/28/2014   Tobacco abuse 12/28/2014   Mononeuropathy of left lower extremity 12/28/2014   Hypertension, benign 09/22/2005    Social History    Tobacco Use   Smoking status: Former    Packs/day: 1.00    Years: 46.00    Additional pack years: 0.00    Total pack years: 46.00    Types: Cigarettes    Start date: 01/07/1985    Quit date: 10/01/2022    Years since quitting: 0.1   Smokeless tobacco: Never  Substance Use Topics   Alcohol use: No    Alcohol/week: 0.0 standard drinks of alcohol    Allergies  Allergen Reactions   Penicillins Rash    Current Meds  Medication Sig   ACCU-CHEK AVIVA PLUS test strip CHECK BLOOD SUGAR ONCE DAILY   albuterol (VENTOLIN HFA) 108 (90 Base) MCG/ACT inhaler Inhale 2 puffs into the lungs every 6 (six) hours as needed for wheezing or shortness of breath.   AquaLance Lancets 30G MISC CHECK BLOOD SUGAR 2 TIMES A DAY   atorvastatin (LIPITOR) 40 MG tablet Take 1 tablet (40 mg total) by mouth daily.   Blood Glucose Monitoring Suppl DEVI 1 each by Does not apply route in the morning, at noon, and at bedtime. May substitute to any manufacturer covered by patient's insurance.   Cholecalciferol 50 MCG (2000 UT) CAPS Take 2,000 Units by mouth daily.   clobetasol ointment (TEMOVATE) 0.05 % Apply 1 application  topically 2 (two) times daily.   Cyanocobalamin (B-12) 1000 MCG SUBL Place 1 tablet under the tongue daily.   dorzolamide-timolol (COSOPT) 22.3-6.8 MG/ML ophthalmic solution Place 1 drop into both eyes 2 (two) times daily.    empagliflozin (JARDIANCE) 25 MG TABS tablet Take 1 tablet (25 mg total) by mouth daily.   Fluticasone-Umeclidin-Vilant (TRELEGY ELLIPTA) 200-62.5-25 MCG/ACT AEPB Inhale 1 puff into the lungs daily.   folic acid (FOLVITE) 1 MG tablet Take 1 mg by mouth daily. Does not take on Friday   gabapentin (NEURONTIN) 300 MG capsule Take 1 capsule (300 mg total) by mouth 2 (two) times daily.   metFORMIN (GLUCOPHAGE-XR) 750 MG 24 hr tablet TAKE 2 TABLETS (1,500 MG TOTAL) BY MOUTHDAILY WITH BREAKFAST   methocarbamol (ROBAXIN) 500 MG tablet Take 1 tablet (500 mg total) by mouth every 6 (six)  hours as needed for muscle spasms.   methotrexate (RHEUMATREX) 2.5 MG tablet Take 2.5 mg by mouth every Friday. Take 7 tablets every friday   polyethylene glycol (MIRALAX / GLYCOLAX) 17 g packet Take 17 g by mouth daily.   senna (SENOKOT) 8.6 MG TABS tablet Take 1 tablet (8.6 mg total) by mouth 2 (two) times daily.   triamcinolone ointment (KENALOG) 0.1 % Apply topically 2 (two) times daily.    Immunization History  Administered Date(s) Administered   Fluad Quad(high Dose 65+) 03/02/2020, 05/25/2022   Influenza Split 04/09/2012   Influenza, High Dose Seasonal PF 02/22/2021   Influenza, Seasonal, Injecte, Preservative Fre 02/17/2011   Influenza,inj,Quad PF,6+ Mos 07/16/2013, 03/23/2014, 04/01/2015, 03/14/2016, 03/16/2017, 02/19/2018, 05/07/2019   Influenza-Unspecified 07/16/2013, 03/23/2014, 04/01/2015, 03/14/2016, 03/16/2017, 02/19/2018, 05/07/2019   Pneumococcal Conjugate-13 04/01/2015   Pneumococcal Polysaccharide-23 01/26/2010, 09/05/2019   Tdap 01/25/2012, 05/25/2022   Zoster, Live 01/08/2015        Objective:     BP 118/70 (BP Location: Left Arm,  Cuff Size: Normal)   Pulse 91   Temp 98.3 F (36.8 C)   Ht 4\' 11"  (1.499 m)   Wt 162 lb (73.5 kg)   SpO2 98%   BMI 32.72 kg/m   SpO2: 98 % O2 Device: None (Room air)  GENERAL: Overweight woman in no acute distress.  Fully ambulatory.  No conversational dyspnea. HEAD: Normocephalic, atraumatic.  EYES: No scleral icterus.  Left corneal opacity (transplant). MOUTH: Teeth intact, Mallampati IV airway, oral mucosa moist.  No thrush. NECK: Supple. No thyromegaly. Trachea midline. No JVD.  No adenopathy. PULMONARY: Good air entry bilaterally.  Coarse, otherwise no adventitious sounds. CARDIOVASCULAR: S1 and S2. Regular rate and rhythm.  No rubs, murmurs or gallops heard. ABDOMEN: Obese, benign. MUSCULOSKELETAL: No joint deformity, mild clubbing of fingers, no edema.  NEUROLOGIC: No focal deficit, no gait disturbance.  Speech is  fluent. SKIN: Intact,warm,dry.  On limited exam no rashes.  Has chronic depigmentation changes particularly in the upper extremities. PSYCH: Mood and behavior normal.  Assessment & Plan:     ICD-10-CM   1. Asthma-COPD overlap syndrome  J44.89    Continue Trelegy Continue as needed albuterol    2. ILD (interstitial lung disease) (HCC)  J84.9    Patient wants to defer workup at present Processes skill characterized/nonspecific    3. Former smoker  Z87.891    Quit 30 Oct 2022 No relapse thus far     Smoking cessation instruction/counseling given:  commended patient for quitting and reviewed strategies for preventing relapses.  Will see the patient in follow-up in 3 months time she is to contact us prior to that time should any new difficulties arise.  Gailen Shelter, MD Advanced Bronchoscopy PCCM [facility]    *This note was dictated using voice recognition software/Dragon.  Despite best efforts to proofread, errors can occur which can change the meaning. Any transcriptional errors that result from this process are unintentional and may not be fully corrected at the time of dictation.

## 2022-11-22 NOTE — Patient Instructions (Signed)
I congratulate you on quitting smoking! YAY!  Continue using her Trelegy, continue using her albuterol as needed.  I am holding off on repeating breathing test for now until you recover well from your back surgery.  We will see her in follow-up in 3 months time.

## 2022-11-23 DIAGNOSIS — Z79891 Long term (current) use of opiate analgesic: Secondary | ICD-10-CM | POA: Diagnosis not present

## 2022-11-23 DIAGNOSIS — J449 Chronic obstructive pulmonary disease, unspecified: Secondary | ICD-10-CM | POA: Diagnosis not present

## 2022-11-23 DIAGNOSIS — Z7984 Long term (current) use of oral hypoglycemic drugs: Secondary | ICD-10-CM | POA: Diagnosis not present

## 2022-11-23 DIAGNOSIS — M4800 Spinal stenosis, site unspecified: Secondary | ICD-10-CM | POA: Diagnosis not present

## 2022-11-23 DIAGNOSIS — E119 Type 2 diabetes mellitus without complications: Secondary | ICD-10-CM | POA: Diagnosis not present

## 2022-11-23 DIAGNOSIS — M6281 Muscle weakness (generalized): Secondary | ICD-10-CM | POA: Diagnosis not present

## 2022-11-23 DIAGNOSIS — M4714 Other spondylosis with myelopathy, thoracic region: Secondary | ICD-10-CM | POA: Diagnosis not present

## 2022-11-23 DIAGNOSIS — M4805 Spinal stenosis, thoracolumbar region: Secondary | ICD-10-CM | POA: Diagnosis not present

## 2022-11-23 DIAGNOSIS — Z4789 Encounter for other orthopedic aftercare: Secondary | ICD-10-CM | POA: Diagnosis not present

## 2022-11-23 DIAGNOSIS — Z981 Arthrodesis status: Secondary | ICD-10-CM | POA: Diagnosis not present

## 2022-11-25 ENCOUNTER — Encounter: Payer: Self-pay | Admitting: Pulmonary Disease

## 2022-11-28 DIAGNOSIS — Z4789 Encounter for other orthopedic aftercare: Secondary | ICD-10-CM | POA: Diagnosis not present

## 2022-11-28 DIAGNOSIS — J449 Chronic obstructive pulmonary disease, unspecified: Secondary | ICD-10-CM | POA: Diagnosis not present

## 2022-11-28 DIAGNOSIS — Z981 Arthrodesis status: Secondary | ICD-10-CM | POA: Diagnosis not present

## 2022-11-28 DIAGNOSIS — Z79891 Long term (current) use of opiate analgesic: Secondary | ICD-10-CM | POA: Diagnosis not present

## 2022-11-28 DIAGNOSIS — M4714 Other spondylosis with myelopathy, thoracic region: Secondary | ICD-10-CM | POA: Diagnosis not present

## 2022-11-28 DIAGNOSIS — M4800 Spinal stenosis, site unspecified: Secondary | ICD-10-CM | POA: Diagnosis not present

## 2022-11-28 DIAGNOSIS — M6281 Muscle weakness (generalized): Secondary | ICD-10-CM | POA: Diagnosis not present

## 2022-11-28 DIAGNOSIS — Z7984 Long term (current) use of oral hypoglycemic drugs: Secondary | ICD-10-CM | POA: Diagnosis not present

## 2022-11-28 DIAGNOSIS — E119 Type 2 diabetes mellitus without complications: Secondary | ICD-10-CM | POA: Diagnosis not present

## 2022-11-28 DIAGNOSIS — M4805 Spinal stenosis, thoracolumbar region: Secondary | ICD-10-CM | POA: Diagnosis not present

## 2022-11-29 DIAGNOSIS — J449 Chronic obstructive pulmonary disease, unspecified: Secondary | ICD-10-CM | POA: Diagnosis not present

## 2022-11-29 DIAGNOSIS — E119 Type 2 diabetes mellitus without complications: Secondary | ICD-10-CM | POA: Diagnosis not present

## 2022-11-29 DIAGNOSIS — M6281 Muscle weakness (generalized): Secondary | ICD-10-CM | POA: Diagnosis not present

## 2022-11-29 DIAGNOSIS — Z7984 Long term (current) use of oral hypoglycemic drugs: Secondary | ICD-10-CM | POA: Diagnosis not present

## 2022-11-29 DIAGNOSIS — Z79891 Long term (current) use of opiate analgesic: Secondary | ICD-10-CM | POA: Diagnosis not present

## 2022-11-29 DIAGNOSIS — M4714 Other spondylosis with myelopathy, thoracic region: Secondary | ICD-10-CM | POA: Diagnosis not present

## 2022-11-29 DIAGNOSIS — Z981 Arthrodesis status: Secondary | ICD-10-CM | POA: Diagnosis not present

## 2022-11-29 DIAGNOSIS — M4805 Spinal stenosis, thoracolumbar region: Secondary | ICD-10-CM | POA: Diagnosis not present

## 2022-11-29 DIAGNOSIS — M4800 Spinal stenosis, site unspecified: Secondary | ICD-10-CM | POA: Diagnosis not present

## 2022-11-29 DIAGNOSIS — Z4789 Encounter for other orthopedic aftercare: Secondary | ICD-10-CM | POA: Diagnosis not present

## 2022-11-30 ENCOUNTER — Telehealth: Payer: Self-pay | Admitting: Family Medicine

## 2022-11-30 ENCOUNTER — Other Ambulatory Visit: Payer: Self-pay | Admitting: Family Medicine

## 2022-11-30 DIAGNOSIS — R809 Proteinuria, unspecified: Secondary | ICD-10-CM

## 2022-11-30 DIAGNOSIS — E1142 Type 2 diabetes mellitus with diabetic polyneuropathy: Secondary | ICD-10-CM

## 2022-11-30 MED ORDER — METFORMIN HCL ER 750 MG PO TB24
ORAL_TABLET | ORAL | 1 refills | Status: DC
Start: 2022-11-30 — End: 2023-07-02

## 2022-11-30 NOTE — Telephone Encounter (Signed)
Julie Wall with [facility] called to report a fall.  No injuries.  CB#  [phone]

## 2022-11-30 NOTE — Telephone Encounter (Signed)
Home Health Verbal Orders - Caller/Agency: Desharria from [facility]  Callback Number: [phone]  Requesting OT/PT/Skilled Nursing/Social Work/Speech Therapy: Social Counsellor. Frequency: N/A

## 2022-12-01 NOTE — Telephone Encounter (Signed)
Verbal order given per Dr. Sowles. 

## 2022-12-06 ENCOUNTER — Telehealth: Payer: Self-pay

## 2022-12-06 ENCOUNTER — Telehealth: Payer: Self-pay | Admitting: Family Medicine

## 2022-12-06 NOTE — Telephone Encounter (Signed)
Spoke to patient and she stated she forgot she had the PT appt today and would call to reschedule. Patient expressed appreciation for calling to make sure she was ok.

## 2022-12-06 NOTE — Telephone Encounter (Signed)
Copied from CRM [id number]. Topic: General - Other >> Dec 06, 2022  3:10 PM Turkey B wrote: Reason for CRM:  Noreene Larsson from [place] called in states pt missed PT appt today. She states unable to reach pt

## 2022-12-06 NOTE — Telephone Encounter (Signed)
Error

## 2022-12-11 NOTE — Progress Notes (Deleted)
PCP: Alba Cory, MD   No chief complaint on file.   HPI:      [name] is a 67 y.o. G3P0 whose LMP was No LMP recorded. Patient is postmenopausal., presents today for her annual examination.  Her menses are {norm/abn:715}, lasting {number: 22536} days.  Dysmenorrhea {dysmen:716}. She {does:18564} have intermenstrual bleeding. She {does:18564} have vasomotor sx.   Sex activity: {sex active: 315163}. She {does:18564} have vaginal dryness.  Last Pap: 05/25/22 Results were: ASCUS with NEGATIVE high risk HPV /neg HPV DNA.  10/26/2020-low-grade squamous intraepithelial lesion with negative HPV 04/18/2017-atypical squamous cells of undetermined significance with negative HPV Hx of STDs: {STD hx:14358}  Last mammogram: 09/17/19  Results were: normal--routine follow-up in 12 months There is no FH of breast cancer. There is no FH of ovarian cancer. The patient {does:18564} do self-breast exams.  Colonoscopy: {hx:15363}  Repeat due after 10*** years.  DEXA:   Tobacco use: {tob:20664} Alcohol use: {Alcohol:11675} No drug use Exercise: {exercise:31265}  She {does:18564} get adequate calcium and Vitamin D in her diet.  Labs with PCP.   Patient Active Problem List   Diagnosis Date Noted   Thoracic spondylosis with myelopathy 10/02/2022   Spinal stenosis of thoracic region 10/02/2022   S/P spinal fusion 10/02/2022   B12 deficiency 09/27/2021   Hypertension associated with diabetes (HCC) 09/27/2021   Diabetic polyneuropathy associated with type 2 diabetes mellitus (HCC)    Hilar lymphadenopathy 02/05/2020   History of COVID-19 02/05/2020   Atherosclerosis of aorta (HCC) 05/04/2017   Pap smear abnormality of cervix with ASCUS favoring benign 04/23/2017   Vitamin D deficiency 12/14/2016   Obesity (BMI 30.0-34.9) 12/13/2015   Chronic bronchitis (HCC) 05/14/2015   Diabetes mellitus (HCC) 05/14/2015   Positive H. pylori test 04/05/2015   Rapid urease test for Helicobacter  pylori infection positive 04/05/2015   Umbilical hernia 04/01/2015   Exomphalos 04/01/2015   RLS (restless legs syndrome) 01/08/2015   Type 2 diabetes mellitus with hyperlipidemia (HCC) 01/08/2015   Neurogenic claudication 01/08/2015   AD (atopic dermatitis) 12/28/2014   Chronic LBP 12/28/2014   Dyslipidemia 12/28/2014   Gastroesophageal reflux disease without esophagitis 12/28/2014   Microalbuminuria 12/28/2014   Disorder of bone and cartilage 12/28/2014   Neuralgia of left thigh 12/28/2014   Perennial allergic rhinitis with seasonal variation 12/28/2014   Tobacco abuse 12/28/2014   Mononeuropathy of left lower extremity 12/28/2014   Hypertension, benign 09/22/2005    Past Surgical History:  Procedure Laterality Date   APPLICATION OF INTRAOPERATIVE CT SCAN N/A 10/02/2022   Procedure: APPLICATION OF INTRAOPERATIVE CT SCAN;  Surgeon: Venetia Night, MD;  Location: [facility] ORS;  Service: Neurosurgery;  Laterality: N/A;   CATARACT EXTRACTION Bilateral 06/05/1978   CESAREAN SECTION     x 2   CORNEAL TRANSPLANT Left    [facility]-Treatment for blindness.    Family History  Problem Relation Age of Onset   Diabetes Mother    Hypertension Mother    Pancreatic cancer Mother    Alcohol abuse Father    Arthritis/Rheumatoid Sister    Heart failure Sister    Diabetes Sister    Kidney disease Sister    Diabetes Brother    Breast cancer Neg Hx     Social History   Socioeconomic History   Marital status: Single    Spouse name: Not on file   Number of children: 3   Years of education: Not on file   Highest education level: High school graduate  Occupational History  Not on file  Tobacco Use   Smoking status: Former    Packs/day: 1.00    Years: 46.00    Additional pack years: 0.00    Total pack years: 46.00    Types: Cigarettes    Start date: 01/07/1985    Quit date: 10/01/2022    Years since quitting: 0.1   Smokeless tobacco: Never  Vaping Use   Vaping Use: Never used   Substance and Sexual Activity   Alcohol use: No    Alcohol/week: 0.0 standard drinks of alcohol   Drug use: No   Sexual activity: Not Currently  Other Topics Concern   Not on file  Social History Narrative   Disabled from severe atopic dermatitis   Has 3 children   Lives alone but very connect with family ( sees sister and her children daily)   Also belongs to a church   Social Determinants of Health   Financial Resource Strain: Low Risk  (07/20/2022)   Overall Financial Resource Strain (CARDIA)    Difficulty of Paying Living Expenses: Not hard at all  Food Insecurity: No Food Insecurity (10/02/2022)   Hunger Vital Sign    Worried About Running Out of Food in the Last Year: Never true    Ran Out of Food in the Last Year: Never true  Transportation Needs: No Transportation Needs (10/02/2022)   PRAPARE - Administrator, Civil Service (Medical): No    Lack of Transportation (Non-Medical): No  Physical Activity: Sufficiently Active (07/20/2022)   Exercise Vital Sign    Days of Exercise per Week: 7 days    Minutes of Exercise per Session: 30 min  Recent Concern: Physical Activity - Insufficiently Active (05/25/2022)   Exercise Vital Sign    Days of Exercise per Week: 2 days    Minutes of Exercise per Session: 20 min  Stress: No Stress Concern Present (07/20/2022)   [employer] of Occupational Health - Occupational Stress Questionnaire    Feeling of Stress : Not at all  Social Connections: Moderately Isolated (07/20/2022)   Social Connection and Isolation Panel [NHANES]    Frequency of Communication with Friends and Family: More than three times a week    Frequency of Social Gatherings with Friends and Family: More than three times a week    Attends Religious Services: More than 4 times per year    Active Member of [employer] or Organizations: No    Attends Banker Meetings: Never    Marital Status: Never married  Intimate Partner Violence: Not At Risk  (10/02/2022)   Humiliation, Afraid, Rape, and Kick questionnaire    Fear of Current or Ex-Partner: No    Emotionally Abused: No    Physically Abused: No    Sexually Abused: No     Current Outpatient Medications:    ACCU-CHEK AVIVA PLUS test strip, CHECK BLOOD SUGAR ONCE DAILY, Disp: 100 each, Rfl: 2   albuterol (VENTOLIN HFA) 108 (90 Base) MCG/ACT inhaler, Inhale 2 puffs into the lungs every 6 (six) hours as needed for wheezing or shortness of breath., Disp: 18 g, Rfl: 0   AquaLance Lancets 30G MISC, CHECK BLOOD SUGAR 2 TIMES A DAY, Disp: 200 each, Rfl: 3   atorvastatin (LIPITOR) 40 MG tablet, Take 1 tablet (40 mg total) by mouth daily., Disp: 90 tablet, Rfl: 1   Blood Glucose Monitoring Suppl DEVI, 1 each by Does not apply route in the morning, at noon, and at bedtime. May substitute to any manufacturer  covered by patient's insurance., Disp: 1 each, Rfl: 0   Cholecalciferol 50 MCG (2000 UT) CAPS, Take 2,000 Units by mouth daily., Disp: , Rfl:    clobetasol ointment (TEMOVATE) 0.05 %, Apply 1 application  topically 2 (two) times daily., Disp: , Rfl:    Cyanocobalamin (B-12) 1000 MCG SUBL, Place 1 tablet under the tongue daily., Disp: 100 tablet, Rfl: 1   dorzolamide-timolol (COSOPT) 22.3-6.8 MG/ML ophthalmic solution, Place 1 drop into both eyes 2 (two) times daily. , Disp: , Rfl:    empagliflozin (JARDIANCE) 25 MG TABS tablet, Take 1 tablet (25 mg total) by mouth daily., Disp: 90 tablet, Rfl: 1   Fluticasone-Umeclidin-Vilant (TRELEGY ELLIPTA) 200-62.5-25 MCG/ACT AEPB, Inhale 1 puff into the lungs daily., Disp: 28 each, Rfl: 11   folic acid (FOLVITE) 1 MG tablet, Take 1 mg by mouth daily. Does not take on Friday, Disp: , Rfl:    gabapentin (NEURONTIN) 300 MG capsule, Take 1 capsule (300 mg total) by mouth 2 (two) times daily., Disp: 90 capsule, Rfl: 0   metFORMIN (GLUCOPHAGE-XR) 750 MG 24 hr tablet, TAKE 2 TABLETS (1,500 MG TOTAL) BY MOUTHDAILY WITH BREAKFAST, Disp: 180 tablet, Rfl: 1    methocarbamol (ROBAXIN) 500 MG tablet, Take 1 tablet (500 mg total) by mouth every 6 (six) hours as needed for muscle spasms., Disp: 120 tablet, Rfl: 0   methotrexate (RHEUMATREX) 2.5 MG tablet, Take 2.5 mg by mouth every Friday. Take 7 tablets every friday, Disp: , Rfl:    polyethylene glycol (MIRALAX / GLYCOLAX) 17 g packet, Take 17 g by mouth daily., Disp: , Rfl:    senna (SENOKOT) 8.6 MG TABS tablet, Take 1 tablet (8.6 mg total) by mouth 2 (two) times daily., Disp: 120 tablet, Rfl: 0   triamcinolone ointment (KENALOG) 0.1 %, Apply topically 2 (two) times daily., Disp: 30 g, Rfl: 0     ROS:  Review of Systems BREAST: No symptoms    Objective: There were no vitals taken for this visit.   OBGyn Exam  Results: No results found for this or any previous visit (from the past 24 hour(s)).  Assessment/Plan:  No diagnosis found.   No orders of the defined types were placed in this encounter.           GYN counsel {counseling: 16159}    F/U  No follow-ups on file.   B. , PA-C 12/11/2022 4:53 PM

## 2022-12-12 ENCOUNTER — Ambulatory Visit: Payer: 59 | Admitting: Obstetrics and Gynecology

## 2022-12-12 DIAGNOSIS — Z01419 Encounter for gynecological examination (general) (routine) without abnormal findings: Secondary | ICD-10-CM

## 2022-12-12 DIAGNOSIS — Z1231 Encounter for screening mammogram for malignant neoplasm of breast: Secondary | ICD-10-CM

## 2022-12-14 DIAGNOSIS — Z79891 Long term (current) use of opiate analgesic: Secondary | ICD-10-CM | POA: Diagnosis not present

## 2022-12-14 DIAGNOSIS — M4714 Other spondylosis with myelopathy, thoracic region: Secondary | ICD-10-CM | POA: Diagnosis not present

## 2022-12-14 DIAGNOSIS — E119 Type 2 diabetes mellitus without complications: Secondary | ICD-10-CM | POA: Diagnosis not present

## 2022-12-14 DIAGNOSIS — Z981 Arthrodesis status: Secondary | ICD-10-CM | POA: Diagnosis not present

## 2022-12-14 DIAGNOSIS — Z4789 Encounter for other orthopedic aftercare: Secondary | ICD-10-CM | POA: Diagnosis not present

## 2022-12-14 DIAGNOSIS — J449 Chronic obstructive pulmonary disease, unspecified: Secondary | ICD-10-CM | POA: Diagnosis not present

## 2022-12-14 DIAGNOSIS — Z7984 Long term (current) use of oral hypoglycemic drugs: Secondary | ICD-10-CM | POA: Diagnosis not present

## 2022-12-14 DIAGNOSIS — M6281 Muscle weakness (generalized): Secondary | ICD-10-CM | POA: Diagnosis not present

## 2022-12-14 DIAGNOSIS — M4805 Spinal stenosis, thoracolumbar region: Secondary | ICD-10-CM | POA: Diagnosis not present

## 2022-12-14 DIAGNOSIS — M4800 Spinal stenosis, site unspecified: Secondary | ICD-10-CM | POA: Diagnosis not present

## 2022-12-18 ENCOUNTER — Other Ambulatory Visit: Payer: Self-pay

## 2022-12-18 DIAGNOSIS — M4714 Other spondylosis with myelopathy, thoracic region: Secondary | ICD-10-CM

## 2022-12-18 DIAGNOSIS — M4804 Spinal stenosis, thoracic region: Secondary | ICD-10-CM

## 2022-12-19 ENCOUNTER — Encounter: Payer: Self-pay | Admitting: Neurosurgery

## 2022-12-19 ENCOUNTER — Ambulatory Visit
Admission: RE | Admit: 2022-12-19 | Discharge: 2022-12-19 | Disposition: A | Discharge status: Home / Self Care | Payer: 59 | Source: Ambulatory Visit | Attending: Neurosurgery | Admitting: Neurosurgery

## 2022-12-19 ENCOUNTER — Ambulatory Visit: Payer: 59 | Admitting: Neurosurgery

## 2022-12-19 ENCOUNTER — Ambulatory Visit
Admission: RE | Admit: 2022-12-19 | Discharge: 2022-12-19 | Disposition: A | Discharge status: Home / Self Care | Payer: 59 | Attending: Neurosurgery | Admitting: Neurosurgery

## 2022-12-19 VITALS — BP 128/78 | Temp 98.7°F | Ht 59.0 in | Wt 162.0 lb

## 2022-12-19 DIAGNOSIS — M4714 Other spondylosis with myelopathy, thoracic region: Secondary | ICD-10-CM | POA: Diagnosis not present

## 2022-12-19 DIAGNOSIS — Z981 Arthrodesis status: Secondary | ICD-10-CM | POA: Diagnosis not present

## 2022-12-19 DIAGNOSIS — M4804 Spinal stenosis, thoracic region: Secondary | ICD-10-CM

## 2022-12-19 DIAGNOSIS — Z09 Encounter for follow-up examination after completed treatment for conditions other than malignant neoplasm: Secondary | ICD-10-CM

## 2022-12-19 MED ORDER — METHOCARBAMOL 500 MG PO TABS: 500.0000 mg | ORAL_TABLET | Freq: Four times a day (QID) | ORAL | 0 refills | Status: DC

## 2022-12-19 NOTE — Progress Notes (Signed)
   REFERRING PHYSICIAN:  No referring provider defined for this encounter.  DOS: 10/02/22 T11-12 PSFD  HISTORY OF PRESENT ILLNESS: 12/19/22  Julie Wall is a 67 year old status post thoracic decompression fusion.  She is doing relatively well however for the last 2 weeks without any particular inciting event she has started to have mid back discomfort as well as increasing radiating pain in her right leg.  This is intermittent in nature.  11/14/22 Dr. Leslie Andrea Wall is status post thoracic decompression and fusion.  She has had a permanent imbalance.  She is no longer having severe pain down the right leg.  She is pleased with her improvements.  She has not smoked since surgery.   PHYSICAL EXAMINATION:  General: Patient is well developed, well nourished, calm, collected, and in no apparent distress.   NEUROLOGICAL:  General: In no acute distress.   Awake, alert, oriented to person, place, and time.  Strength:            Side Iliopsoas Quads Hamstring PF DF EHL  R 5 5 5 5 5 5   L 5 5 5 5 5 5    Patient ambulates with cane Incision healed   ROS (Neurologic):  Negative except as noted above  IMAGING: 12/19/22 thoracic xrays No concerning features  ASSESSMENT/PLAN:  Julie Wall is doing well after thoracic decompression and fusion.  She started to have some increasing mid back pain although her x-rays are reassuring.  I encouraged her to try Robaxin and given her prescription for this.  Should this fail to improve her symptoms she will let us know and we discussed possibly updating imaging.  She is to return to clinic in 3 months to see Dr. Myer Wall with thoracic x-rays prior.  She have any questions or concerns.  She expressed understanding and was in agreement with this plan.  I spent a total of 20 minutes in both face-to-face and non-face-to-face activities for this visit on the date of this encounter.   Julie Charity PA-C Department of neurosurgery

## 2023-01-17 NOTE — Progress Notes (Deleted)
Name: Julie Wall   MRN: [id number]    DOB: 1955-06-10   Date:01/17/2023       Progress Note  Subjective  Chief Complaint  Follow Up  HPI  Diabetes: A1C went up from 6.8 % to 7.6 % , 8.4 %, down to 6.5 % , up to 7.2% and today is 7.6 % She is now taking 1500 mg of Metformin daily and Jardiance, however she has been drinking sweet tea again.   She also has associated obesity, dyslipidemia,  and microalbuminuria. We stopped Losartan because her bp was low. She denies  polyphagia or polydipsia but has polyuria. She is up to date with eye exam., last urine micro was negative She takes statin therapy for dyslipidemia    Eczema: she sees dermatologist at [facility], Dr. Amada Kingfisher  last visit was 08/2022   She is taking Methotrexate 7.5 mg weekly, and also folic acid , reviewed recent labs done at [facility]. She is having worse symptoms in her hands    Hyperlipidemia/Atherosclerosis of aorta : she is back  Atorvastatin and aspirin daily , last LDL was 40 . Continue medication , she does not want to get labs done today, she will get it done on her next visit    COPD/Intesticial lung disease on CT chest : she had  quit smoking 12/03/2016   but resumed smoking Fall 2019 S he quit again when she developed COVID-19 in 2021, but resumed smoking again since her sister died  Summer 2022 she has been smoking 4 cigarettes per day, advised to try to quit prior to back surgery. She is now on Trelegy daily now , she denies cough, sob or wheezing at this time.  She is under the care of Dr. Marcos Eke.    Chronic back pain with sciatica  She has seen Dr. Sherryll Burger, Dr. Council Mechanic and recently referred back to Dr. Marcell Barlow due to increase in symptoms, described as numbness and sharp pain that radiates from lateral foot to her back, affecting her sleep. Due to findings below , neurosurgeon recommended T11-12 posterior spinal fusion with decompression under general anesthesia. Surgery is scheduled for surgery 10/02/2022    IMPRESSION  MRI thoracic spine: 07/27/2022   1. At T11-T12, severe canal stenosis with cord deformity and mild STIR hyperintensity, compatible with cord edema and/or myelomalacia. Moderate to severe bilateral foraminal stenosis at this level. 2. At T9-T10 and T10-T11, moderate canal stenosis with moderate bilateral foraminal stenosis. 3. Additional multilevel mild canal and foraminal stenosis in the thoracic spine. 4. Left lung opacity correlates with findings better characterized on recent CT chest.  Impression Lumbar spine MRI 07/27/2022  1. Probably severe canal stenosis at T11-T12 and at least moderate canal stenosis at T10-T11 with cord compression and cord edema and/or myelomalacia at T11-T12, only imaged sagittally. Findings are likely progressed relative to 2021 prior, but recommend dedicated MRI of the thoracic spine to fully characterize. 2. At L4-L5, severe left subarticular recess stenosis with moderate to severe central canal stenosis and moderate left foraminal stenosis. 3. At L5-S1, moderate to severe right and moderate left foraminal stenosis. Mild canal stenosis. 4. At L3-L4, moderate canal stenosis with mild-to-moderate left foraminal stenosis.   RLS: She sees neurologist, Dr. Sherryll Burger,  she was taking Requip but ran out of medication, still has magnesium at home, symptoms wakes her up during the night, advised her to call them back   Patient Active Problem List   Diagnosis Date Noted   Thoracic spondylosis with myelopathy 10/02/2022   Spinal  stenosis of thoracic region 10/02/2022   S/P spinal fusion 10/02/2022   B12 deficiency 09/27/2021   Hypertension associated with diabetes (HCC) 09/27/2021   Diabetic polyneuropathy associated with type 2 diabetes mellitus (HCC)    Hilar lymphadenopathy 02/05/2020   History of COVID-19 02/05/2020   Atherosclerosis of aorta (HCC) 05/04/2017   Pap smear abnormality of cervix with ASCUS favoring benign 04/23/2017   Vitamin D deficiency  12/14/2016   Obesity (BMI 30.0-34.9) 12/13/2015   Chronic bronchitis (HCC) 05/14/2015   Diabetes mellitus (HCC) 05/14/2015   Positive H. pylori test 04/05/2015   Rapid urease test for Helicobacter pylori infection positive 04/05/2015   Umbilical hernia 04/01/2015   Exomphalos 04/01/2015   RLS (restless legs syndrome) 01/08/2015   Type 2 diabetes mellitus with hyperlipidemia (HCC) 01/08/2015   Neurogenic claudication 01/08/2015   AD (atopic dermatitis) 12/28/2014   Chronic LBP 12/28/2014   Dyslipidemia 12/28/2014   Gastroesophageal reflux disease without esophagitis 12/28/2014   Microalbuminuria 12/28/2014   Disorder of bone and cartilage 12/28/2014   Neuralgia of left thigh 12/28/2014   Perennial allergic rhinitis with seasonal variation 12/28/2014   Tobacco abuse 12/28/2014   Mononeuropathy of left lower extremity 12/28/2014   Hypertension, benign 09/22/2005    Past Surgical History:  Procedure Laterality Date   APPLICATION OF INTRAOPERATIVE CT SCAN N/A 10/02/2022   Procedure: APPLICATION OF INTRAOPERATIVE CT SCAN;  Surgeon: Venetia Night, MD;  Location: [facility] ORS;  Service: Neurosurgery;  Laterality: N/A;   CATARACT EXTRACTION Bilateral 06/05/1978   CESAREAN SECTION     x 2   CORNEAL TRANSPLANT Left    [facility]-Treatment for blindness.    Family History  Problem Relation Age of Onset   Diabetes Mother    Hypertension Mother    Pancreatic cancer Mother    Alcohol abuse Father    Arthritis/Rheumatoid Sister    Heart failure Sister    Diabetes Sister    Kidney disease Sister    Diabetes Brother    Breast cancer Neg Hx     Social History   Tobacco Use   Smoking status: Former    Current packs/day: 0.00    Average packs/day: 1 pack/day for 46.0 years (46.0 ttl pk-yrs)    Types: Cigarettes    Start date: 01/07/1985    Quit date: 10/01/2022    Years since quitting: 0.2   Smokeless tobacco: Never  Substance Use Topics   Alcohol use: No    Alcohol/week: 0.0 standard  drinks of alcohol     Current Outpatient Medications:    ACCU-CHEK AVIVA PLUS test strip, CHECK BLOOD SUGAR ONCE DAILY, Disp: 100 each, Rfl: 2   albuterol (VENTOLIN HFA) 108 (90 Base) MCG/ACT inhaler, Inhale 2 puffs into the lungs every 6 (six) hours as needed for wheezing or shortness of breath., Disp: 18 g, Rfl: 0   AquaLance Lancets 30G MISC, CHECK BLOOD SUGAR 2 TIMES A DAY, Disp: 200 each, Rfl: 3   atorvastatin (LIPITOR) 40 MG tablet, Take 1 tablet (40 mg total) by mouth daily., Disp: 90 tablet, Rfl: 1   Blood Glucose Monitoring Suppl DEVI, 1 each by Does not apply route in the morning, at noon, and at bedtime. May substitute to any manufacturer covered by patient's insurance., Disp: 1 each, Rfl: 0   Cholecalciferol 50 MCG (2000 UT) CAPS, Take 2,000 Units by mouth daily., Disp: , Rfl:    clobetasol ointment (TEMOVATE) 0.05 %, Apply 1 application  topically 2 (two) times daily., Disp: , Rfl:    Cyanocobalamin (  B-12) 1000 MCG SUBL, Place 1 tablet under the tongue daily., Disp: 100 tablet, Rfl: 1   empagliflozin (JARDIANCE) 25 MG TABS tablet, Take 1 tablet (25 mg total) by mouth daily., Disp: 90 tablet, Rfl: 1   Fluticasone-Umeclidin-Vilant (TRELEGY ELLIPTA) 200-62.5-25 MCG/ACT AEPB, Inhale 1 puff into the lungs daily., Disp: 28 each, Rfl: 11   folic acid (FOLVITE) 1 MG tablet, Take 1 mg by mouth daily. Does not take on Friday, Disp: , Rfl:    gabapentin (NEURONTIN) 300 MG capsule, Take 1 capsule (300 mg total) by mouth 2 (two) times daily., Disp: 90 capsule, Rfl: 0   metFORMIN (GLUCOPHAGE-XR) 750 MG 24 hr tablet, TAKE 2 TABLETS (1,500 MG TOTAL) BY MOUTHDAILY WITH BREAKFAST, Disp: 180 tablet, Rfl: 1   methocarbamol (ROBAXIN) 500 MG tablet, Take 1 tablet (500 mg total) by mouth 4 (four) times daily., Disp: 120 tablet, Rfl: 0   methotrexate (RHEUMATREX) 2.5 MG tablet, Take 2.5 mg by mouth every Friday. Take 7 tablets every friday, Disp: , Rfl:    senna (SENOKOT) 8.6 MG TABS tablet, Take 1 tablet  (8.6 mg total) by mouth 2 (two) times daily., Disp: 120 tablet, Rfl: 0   triamcinolone ointment (KENALOG) 0.1 %, Apply topically 2 (two) times daily., Disp: 30 g, Rfl: 0  Allergies  Allergen Reactions   Penicillins Rash    I personally reviewed active problem list, medication list, allergies, family history, social history, health maintenance with the patient/caregiver today.   ROS  ***  Objective  There were no vitals filed for this visit.  There is no height or weight on file to calculate BMI.  Physical Exam ***  Recent Results (from the past 2160 hour(s))  HM DIABETES EYE EXAM     Status: None   Collection Time: 11/07/22 12:00 AM  Result Value Ref Range   HM Diabetic Eye Exam No Retinopathy No Retinopathy    Diabetic Foot Exam: Diabetic Foot Exam - Simple   No data filed    ***  PHQ2/9:    09/18/2022    3:23 PM 07/20/2022    9:21 AM 07/12/2022    4:22 PM 06/01/2022    9:15 AM 05/25/2022   10:24 AM  Depression screen PHQ 2/9  Decreased Interest 0 0 0 0 0  Down, Depressed, Hopeless 0 0 0 0 0  PHQ - 2 Score 0 0 0 0 0  Altered sleeping 0 0  0 0  Tired, decreased energy 0 0  0 0  Change in appetite 0 0  0 0  Feeling bad or failure about yourself  0 0  0 0  Trouble concentrating 0 0  0 0  Moving slowly or fidgety/restless 0 0  0 0  Suicidal thoughts 0 0  0 0  PHQ-9 Score 0 0  0 0  Difficult doing work/chores  Not difficult at all  Not difficult at all     phq 9 is {gen pos [id number]}   Fall Risk:    09/18/2022    3:23 PM 07/20/2022    9:18 AM 07/12/2022    4:04 PM 06/01/2022    9:15 AM 05/25/2022   10:24 AM  Fall Risk   Falls in the past year? 0 0 0 0 0  Number falls in past yr: 0 0 0 0 0  Injury with Fall? 0 0 0 0 0  Risk for fall due to : No Fall Risks No Fall Risks Medication side effect  No Fall Risks  Follow  up Falls prevention discussed Education provided;Falls prevention discussed Falls prevention discussed  Falls prevention discussed       Functional Status Survey:      Assessment & Plan  *** There are no diagnoses linked to this encounter.

## 2023-01-18 ENCOUNTER — Ambulatory Visit: Payer: 59 | Admitting: Family Medicine

## 2023-01-18 DIAGNOSIS — Z1231 Encounter for screening mammogram for malignant neoplasm of breast: Secondary | ICD-10-CM

## 2023-01-18 DIAGNOSIS — Z1211 Encounter for screening for malignant neoplasm of colon: Secondary | ICD-10-CM

## 2023-01-18 DIAGNOSIS — E1142 Type 2 diabetes mellitus with diabetic polyneuropathy: Secondary | ICD-10-CM

## 2023-03-01 ENCOUNTER — Ambulatory Visit: Payer: 59 | Admitting: Pulmonary Disease

## 2023-03-03 ENCOUNTER — Encounter: Payer: Self-pay | Admitting: Pulmonary Disease

## 2023-03-06 ENCOUNTER — Telehealth: Payer: Self-pay

## 2023-03-06 NOTE — Patient Outreach (Signed)
Care Management   Outreach Note  03/06/2023 Name: Julie Wall MRN: [id number] DOB: 1955-07-06  An unsuccessful telephone outreach was attempted today to contact the patient about Care Management needs.     Follow Up Plan:  A HIPAA compliant phone message was left for the patient providing contact information and requesting a return call.     Katina Degree [facility]  [facility], [facility] RN Care Manager Direct Dial: [phone] Website: [email or web address]

## 2023-03-15 ENCOUNTER — Telehealth: Payer: Self-pay

## 2023-03-15 NOTE — Patient Outreach (Signed)
Care Management   Outreach Note  03/15/2023 Name: Julie Wall MRN: [id number] DOB: [birth year]  An unsuccessful telephone outreach was attempted today to contact the patient about Care Management needs.      Follow Up Plan:  Phone service did not have an option for voice messages. A member of the care team will make additional outreach attempts.   Katina Degree [facility]  [facility], [facility] RN Care Manager Direct Dial: [phone] Website: [email or web address]

## 2023-03-20 ENCOUNTER — Encounter: Payer: Self-pay | Admitting: Pulmonary Disease

## 2023-03-21 ENCOUNTER — Other Ambulatory Visit: Payer: Self-pay

## 2023-03-21 ENCOUNTER — Telehealth: Payer: Self-pay

## 2023-03-21 DIAGNOSIS — M4714 Other spondylosis with myelopathy, thoracic region: Secondary | ICD-10-CM

## 2023-03-21 NOTE — Patient Outreach (Signed)
Care Management   Outreach Note  03/21/2023 Name: Julie Wall MRN: [id number] DOB: 28-May-1956  Third unsuccessful telephone outreach was attempted today to contact the patient about Care Management needs. The provider has been notified of our unsuccessful attempts to make or maintain contact with the patient.    Follow Up Plan:  The care management team has been unable to make contact with Julie Wall after multiple attempts. The  team will gladly assists if Julie Wall requires assistance in the future.   Katina [facility]  [facility], [facility] RN Care Manager Direct Dial: [phone] Website: [email or web address]

## 2023-03-22 ENCOUNTER — Ambulatory Visit: Payer: 59 | Admitting: Neurosurgery

## 2023-03-22 ENCOUNTER — Ambulatory Visit
Admission: RE | Admit: 2023-03-22 | Discharge: 2023-03-22 | Disposition: A | Discharge status: Home / Self Care | Payer: 59 | Attending: Neurosurgery | Admitting: Neurosurgery

## 2023-03-22 ENCOUNTER — Encounter: Payer: Self-pay | Admitting: Neurosurgery

## 2023-03-22 ENCOUNTER — Ambulatory Visit
Admission: RE | Admit: 2023-03-22 | Discharge: 2023-03-22 | Disposition: A | Discharge status: Home / Self Care | Payer: 59 | Source: Ambulatory Visit | Attending: Neurosurgery | Admitting: Neurosurgery

## 2023-03-22 VITALS — BP 124/72 | Ht 59.0 in | Wt 162.0 lb

## 2023-03-22 DIAGNOSIS — G8929 Other chronic pain: Secondary | ICD-10-CM | POA: Diagnosis not present

## 2023-03-22 DIAGNOSIS — M4316 Spondylolisthesis, lumbar region: Secondary | ICD-10-CM

## 2023-03-22 DIAGNOSIS — M4714 Other spondylosis with myelopathy, thoracic region: Secondary | ICD-10-CM | POA: Diagnosis not present

## 2023-03-22 DIAGNOSIS — M5441 Lumbago with sciatica, right side: Secondary | ICD-10-CM

## 2023-03-22 DIAGNOSIS — M5416 Radiculopathy, lumbar region: Secondary | ICD-10-CM

## 2023-03-22 DIAGNOSIS — Z4789 Encounter for other orthopedic aftercare: Secondary | ICD-10-CM | POA: Diagnosis not present

## 2023-03-22 DIAGNOSIS — M48062 Spinal stenosis, lumbar region with neurogenic claudication: Secondary | ICD-10-CM | POA: Diagnosis not present

## 2023-03-22 DIAGNOSIS — Z981 Arthrodesis status: Secondary | ICD-10-CM | POA: Diagnosis not present

## 2023-03-22 NOTE — Progress Notes (Signed)
   REFERRING PHYSICIAN:  Alba Cory, Md [address] [place],  [place] [address]  DOS: 10/02/22 T11-12 PSFD  HISTORY OF PRESENT ILLNESS: Julie Wall is status post thoracic decompression and fusion.  She has done well with her thoracic surgery.  She comes in today with pain down her right leg.  She previously had similar symptoms, but they improved before our prior visit.  She is having some weakness and difficulty walking due to this.   PHYSICAL EXAMINATION:  General: Patient is well developed, well nourished, calm, collected, and in no apparent distress.   NEUROLOGICAL:  General: In no acute distress.   Awake, alert, oriented to person, place, and time.  Strength:            Side Iliopsoas Quads Hamstring PF DF EHL  R 4+ 4+ 5 5 5 5   L 5 5 5 5 5 5    Incision c/d/i with a small area of dehiscence at the inferior most portion of the incision   ROS (Neurologic):  Negative except as noted above  IMAGING: No complications noted  ASSESSMENT/PLAN:  Julie Wall is doing well after thoracic decompression and fusion.  She has had a return of symptoms from her lower back.  She has spondylolisthesis at L4-5 with severe compression at L4-5.  She additionally has moderate stenosis at L3-4.  She has some element of radiculopathy and neurogenic claudication as well as back pain with sciatica.  She would like to consider another injection, as they helped her significantly before.  I will send a referral for this.  Additionally, we will restart physical therapy.  I will see her back in a couple of months to review her symptoms.    I spent a total of 15 minutes in this patient's care today. This time was spent reviewing pertinent records including imaging studies, obtaining and confirming history, performing a directed evaluation, formulating and discussing my recommendations, and documenting the visit within the medical record.   Venetia Night MD Department of  neurosurgery

## 2023-04-04 DIAGNOSIS — M48062 Spinal stenosis, lumbar region with neurogenic claudication: Secondary | ICD-10-CM | POA: Diagnosis not present

## 2023-04-04 DIAGNOSIS — M5416 Radiculopathy, lumbar region: Secondary | ICD-10-CM | POA: Diagnosis not present

## 2023-04-05 DIAGNOSIS — M4316 Spondylolisthesis, lumbar region: Secondary | ICD-10-CM | POA: Diagnosis not present

## 2023-04-18 DIAGNOSIS — M4316 Spondylolisthesis, lumbar region: Secondary | ICD-10-CM | POA: Diagnosis not present

## 2023-04-20 DIAGNOSIS — M4316 Spondylolisthesis, lumbar region: Secondary | ICD-10-CM | POA: Diagnosis not present

## 2023-04-24 DIAGNOSIS — M4316 Spondylolisthesis, lumbar region: Secondary | ICD-10-CM | POA: Diagnosis not present

## 2023-04-27 DIAGNOSIS — M4316 Spondylolisthesis, lumbar region: Secondary | ICD-10-CM | POA: Diagnosis not present

## 2023-05-04 DIAGNOSIS — M4316 Spondylolisthesis, lumbar region: Secondary | ICD-10-CM | POA: Diagnosis not present

## 2023-05-07 DIAGNOSIS — M48062 Spinal stenosis, lumbar region with neurogenic claudication: Secondary | ICD-10-CM | POA: Diagnosis not present

## 2023-05-07 DIAGNOSIS — M5416 Radiculopathy, lumbar region: Secondary | ICD-10-CM | POA: Diagnosis not present

## 2023-05-07 DIAGNOSIS — M4804 Spinal stenosis, thoracic region: Secondary | ICD-10-CM | POA: Diagnosis not present

## 2023-05-07 DIAGNOSIS — M4807 Spinal stenosis, lumbosacral region: Secondary | ICD-10-CM | POA: Diagnosis not present

## 2023-05-11 DIAGNOSIS — M4316 Spondylolisthesis, lumbar region: Secondary | ICD-10-CM | POA: Diagnosis not present

## 2023-05-18 DIAGNOSIS — M4316 Spondylolisthesis, lumbar region: Secondary | ICD-10-CM | POA: Diagnosis not present

## 2023-05-21 ENCOUNTER — Other Ambulatory Visit: Payer: Self-pay

## 2023-05-21 DIAGNOSIS — M4714 Other spondylosis with myelopathy, thoracic region: Secondary | ICD-10-CM

## 2023-05-22 ENCOUNTER — Ambulatory Visit: Payer: 59 | Admitting: Neurosurgery

## 2023-05-25 DIAGNOSIS — M4316 Spondylolisthesis, lumbar region: Secondary | ICD-10-CM | POA: Diagnosis not present

## 2023-06-01 DIAGNOSIS — M4316 Spondylolisthesis, lumbar region: Secondary | ICD-10-CM | POA: Diagnosis not present

## 2023-06-07 ENCOUNTER — Other Ambulatory Visit: Payer: Self-pay

## 2023-06-07 ENCOUNTER — Encounter: Payer: Self-pay | Admitting: Neurosurgery

## 2023-06-07 ENCOUNTER — Ambulatory Visit: Payer: 59 | Admitting: Neurosurgery

## 2023-06-07 VITALS — BP 130/72 | Ht 59.0 in | Wt 162.0 lb

## 2023-06-07 DIAGNOSIS — M5441 Lumbago with sciatica, right side: Secondary | ICD-10-CM | POA: Diagnosis not present

## 2023-06-07 DIAGNOSIS — M4316 Spondylolisthesis, lumbar region: Secondary | ICD-10-CM

## 2023-06-07 DIAGNOSIS — M5416 Radiculopathy, lumbar region: Secondary | ICD-10-CM | POA: Diagnosis not present

## 2023-06-07 DIAGNOSIS — M5442 Lumbago with sciatica, left side: Secondary | ICD-10-CM

## 2023-06-07 DIAGNOSIS — Z01818 Encounter for other preprocedural examination: Secondary | ICD-10-CM

## 2023-06-07 DIAGNOSIS — G8929 Other chronic pain: Secondary | ICD-10-CM | POA: Diagnosis not present

## 2023-06-07 DIAGNOSIS — M48062 Spinal stenosis, lumbar region with neurogenic claudication: Secondary | ICD-10-CM | POA: Diagnosis not present

## 2023-06-07 NOTE — Progress Notes (Signed)
 REFERRING PHYSICIAN:  No referring provider defined for this encounter.  DOS: 10/02/22 T11-12 PSFD  HISTORY OF PRESENT ILLNESS:  06/07/2023 Ms. Urista returns to see me.  She has continued pain down her legs.  She has tried physical therapy without improvement.  03/22/2023 Ellenora Viglione is status post thoracic decompression and fusion.  She has done well with her thoracic surgery.  She comes in today with pain down her right leg.  She previously had similar symptoms, but they improved before our prior visit.  She is having some weakness and difficulty walking due to this.   PHYSICAL EXAMINATION:  General: Patient is well developed, well nourished, calm, collected, and in no apparent distress.   NEUROLOGICAL:  General: In no acute distress.   Awake, alert, oriented to person, place, and time.  Strength:            Side Iliopsoas Quads Hamstring PF DF EHL  R 4+ 4+ 5 5 5 5   L 5 5 5 5 5 5    Incision c/d/i with a small area of dehiscence at the inferior most portion of the incision   ROS (Neurologic):  Negative except as noted above  IMAGING: No complications noted  MRI L spine 07/25/2022  IMPRESSION: 1. Probably severe canal stenosis at T11-T12 and at least moderate canal stenosis at T10-T11 with cord compression and cord edema and/or myelomalacia at T11-T12, only imaged sagittally. Findings are likely progressed relative to 2021 prior, but recommend dedicated MRI of the thoracic spine to fully characterize. 2. At L4-L5, severe left subarticular recess stenosis with moderate to severe central canal stenosis and moderate left foraminal stenosis. 3. At L5-S1, moderate to severe right and moderate left foraminal stenosis. Mild canal stenosis. 4. At L3-L4, moderate canal stenosis with mild-to-moderate left foraminal stenosis.     Electronically Signed   By: Gilmore GORMAN Molt M.D.   On: 07/25/2022 12:29  ASSESSMENT/PLAN:  Krissa Ante is doing well after thoracic  decompression and fusion.  She has had a return of symptoms from her lower back.  She has spondylolisthesis at L4-5 with severe compression at L4-5.  She additionally has moderate stenosis at L3-4.  She has some element of radiculopathy and neurogenic claudication as well as back pain with sciatica.  She also has severe compression of the right L5 nerve root in the foramen at L5-S1.  She has tried and failed conservative management.  I have recommended surgical intervention.  I recommended L3-4 decompression with right sided L5-S1 far lateral foraminotomy as well as L4-5 transforaminal lumbar interbody fusion.  I think a transforaminal lumbar interbody fusion at L4-5 is indicated due to her spondylolisthesis as well as back pain.  I discussed the planned procedure at length with the patient, including the risks, benefits, alternatives, and indications. The risks discussed include but are not limited to bleeding, infection, need for reoperation, spinal fluid leak, stroke, vision loss, anesthetic complication, coma, paralysis, and even death. I also described in detail that improvement was not guaranteed.  The patient expressed understanding of these risks, and asked that we proceed with surgery. I described the surgery in layman's terms, and gave ample opportunity for questions, which were answered to the best of my ability.  I spent a total of 15 minutes in this patient's care today. This time was spent reviewing pertinent records including imaging studies, obtaining and confirming history, performing a directed evaluation, formulating and discussing my recommendations, and documenting the visit within the medical record.   Reeves Daisy  MD Department of neurosurgery

## 2023-06-07 NOTE — Patient Instructions (Signed)
 Please see below for information in regards to your upcoming surgery:   Planned surgery: L4-5 transforaminal lumbar interbody fusion, Right L5-S1 far lateral foraminotomy, L3-4 decompression   Surgery date: 07/18/23 at [facility] (Medical Mall: [address] [place], [place] [address]) - you will find out your arrival time the business day before your surgery.   Pre-op appointment at [facility] Pre-admit Testing: we will call you with a date/time for this. If you are scheduled for an in person appointment, Pre-admit Testing is located on the first floor of the [facility] building, [address], [address]. Please bring all prescriptions in the original prescription bottles to your appointment. During this appointment, they will advise you which medications you can take the morning of surgery, and which medications you will need to hold for surgery. Labs (such as blood work, EKG) may be done at your pre-op appointment. You are not required to fast for these labs. Should you need to change your pre-op appointment, please call Pre-admit testing at [phone].     Blood thinners:   Aspirin :   stop aspirin  7 days prior, resume aspirin  14 days after   Diabetes/weight loss medications:  Empagliflozin  (Jardiance ): hold 3 days prior to surgery Metformin : hold for 2 days prior to surgery   Surgical clearance: we will send a clearance form to Dr Glenard. They may wish to see you in their office prior to signing the clearance form. If so, they may call you to schedule an appointment.    NSAIDS (Non-steroidal anti-inflammatory drugs): because you are having a fusion, please avoid taking any NSAIDS (examples: ibuprofen, motrin, aleve, naproxen, meloxicam , diclofenac) for 3 months after surgery. Celebrex  is an exception and is OK to take, if prescribed. Tylenol  is not an NSAID.    Common restrictions after surgery: No bending, lifting, or twisting  ("BLT"). Avoid lifting objects heavier than 10 pounds for the first 6 weeks after surgery. Where possible, avoid household activities that involve lifting, bending, reaching, pushing, or pulling such as laundry, vacuuming, grocery shopping, and childcare. Try to arrange for help from friends and family for these activities while you heal. Do not drive while taking prescription pain medication. Weeks 6 through 12 after surgery: avoid lifting more than 25 pounds.    X-rays after surgery: Because you are having a fusion or arthroplasty: for appointments after your 2 week follow-up: please arrive at the [facility] outpatient imaging center ([address], [address], [place]) or [facility] one hour prior to your appointment for x-rays. This applies to every appointment after your 2 week follow-up. Failure to do so may result in your appointment being rescheduled.    How to contact us :  If you have any questions/concerns before or after surgery, you can reach us  at [phone], or you can send a mychart message. We can be reached by phone or mychart 8am-4pm, Monday-Friday.  *Please note: Calls after 4pm are forwarded to a third party answering service. Mychart messages are not routinely monitored during evenings, weekends, and holidays. Please call our office to contact the answering service for urgent concerns during non-business hours.   If you have FMLA/disability paperwork, please drop it off or fax it to [phone], attention Patty.   Appointments/FMLA & disability paperwork: Odetta Mora, & Ritta Registered Nurse/Surgery scheduler: Othelia Medical Assistants: Damien ODESSIA Sailors Physician Assistants: Lyle Decamp, PA-C, Edsel Goods, PA-C & Glade Boys, PA-C Surgeons: Reeves Daisy, MD & Penne Sharps, MD

## 2023-06-07 NOTE — Addendum Note (Signed)
 Addended by: Sharlot Gowda on: 06/07/2023 02:24 PM   Modules accepted: Orders

## 2023-06-10 ENCOUNTER — Encounter: Payer: Self-pay | Admitting: Urgent Care

## 2023-06-14 ENCOUNTER — Ambulatory Visit
Admission: RE | Admit: 2023-06-14 | Discharge: 2023-06-14 | Disposition: A | Discharge status: Home / Self Care | Payer: 59 | Source: Ambulatory Visit | Attending: Acute Care | Admitting: Acute Care

## 2023-06-14 DIAGNOSIS — Z122 Encounter for screening for malignant neoplasm of respiratory organs: Secondary | ICD-10-CM | POA: Diagnosis not present

## 2023-06-14 DIAGNOSIS — F17210 Nicotine dependence, cigarettes, uncomplicated: Secondary | ICD-10-CM | POA: Diagnosis not present

## 2023-06-14 DIAGNOSIS — Z87891 Personal history of nicotine dependence: Secondary | ICD-10-CM | POA: Insufficient documentation

## 2023-06-19 ENCOUNTER — Ambulatory Visit: Payer: 59 | Admitting: Neurosurgery

## 2023-06-25 ENCOUNTER — Other Ambulatory Visit: Payer: Self-pay

## 2023-06-25 DIAGNOSIS — Z87891 Personal history of nicotine dependence: Secondary | ICD-10-CM

## 2023-06-25 DIAGNOSIS — Z122 Encounter for screening for malignant neoplasm of respiratory organs: Secondary | ICD-10-CM

## 2023-06-25 DIAGNOSIS — F17210 Nicotine dependence, cigarettes, uncomplicated: Secondary | ICD-10-CM

## 2023-07-02 ENCOUNTER — Encounter: Payer: Self-pay | Admitting: Family Medicine

## 2023-07-02 ENCOUNTER — Telehealth: Payer: Self-pay

## 2023-07-02 ENCOUNTER — Ambulatory Visit (INDEPENDENT_AMBULATORY_CARE_PROVIDER_SITE_OTHER): Payer: 59 | Admitting: Family Medicine

## 2023-07-02 VITALS — BP 134/72 | HR 95 | Resp 16 | Ht 59.0 in | Wt 149.7 lb

## 2023-07-02 DIAGNOSIS — G8929 Other chronic pain: Secondary | ICD-10-CM

## 2023-07-02 DIAGNOSIS — J410 Simple chronic bronchitis: Secondary | ICD-10-CM | POA: Diagnosis not present

## 2023-07-02 DIAGNOSIS — E1142 Type 2 diabetes mellitus with diabetic polyneuropathy: Secondary | ICD-10-CM | POA: Diagnosis not present

## 2023-07-02 DIAGNOSIS — G2581 Restless legs syndrome: Secondary | ICD-10-CM

## 2023-07-02 DIAGNOSIS — L308 Other specified dermatitis: Secondary | ICD-10-CM | POA: Diagnosis not present

## 2023-07-02 DIAGNOSIS — E1169 Type 2 diabetes mellitus with other specified complication: Secondary | ICD-10-CM

## 2023-07-02 DIAGNOSIS — Z91199 Patient's noncompliance with other medical treatment and regimen due to unspecified reason: Secondary | ICD-10-CM

## 2023-07-02 DIAGNOSIS — I700 Atherosclerosis of aorta: Secondary | ICD-10-CM | POA: Diagnosis not present

## 2023-07-02 DIAGNOSIS — M5441 Lumbago with sciatica, right side: Secondary | ICD-10-CM | POA: Diagnosis not present

## 2023-07-02 DIAGNOSIS — Z01818 Encounter for other preprocedural examination: Secondary | ICD-10-CM

## 2023-07-02 DIAGNOSIS — J849 Interstitial pulmonary disease, unspecified: Secondary | ICD-10-CM | POA: Diagnosis not present

## 2023-07-02 DIAGNOSIS — E538 Deficiency of other specified B group vitamins: Secondary | ICD-10-CM

## 2023-07-02 DIAGNOSIS — E785 Hyperlipidemia, unspecified: Secondary | ICD-10-CM

## 2023-07-02 LAB — POCT GLYCOSYLATED HEMOGLOBIN (HGB A1C): HbA1c POC (<> result, manual entry): >14 % (ref 4.0–5.6)

## 2023-07-02 MED ORDER — ATORVASTATIN CALCIUM 40 MG PO TABS: 40.0000 mg | ORAL_TABLET | Freq: Every day | ORAL | 1 refills | Status: DC

## 2023-07-02 MED ORDER — METFORMIN HCL ER 750 MG PO TB24
ORAL_TABLET | ORAL | 0 refills | Status: DC
Start: 2023-07-02 — End: 2023-12-18

## 2023-07-02 MED ORDER — EMPAGLIFLOZIN 25 MG PO TABS: 25.0000 mg | ORAL_TABLET | Freq: Every day | ORAL | 1 refills | Status: DC

## 2023-07-02 NOTE — Telephone Encounter (Signed)
Received the following message from Dr Carlynn Purl: "A1C over 14%, I do not recommend surgery Feb 12 th, she will return in 6 weeks for Fructosamine level and hopefully be able to have surgery shortly after "  I spoke with the patient and informed her that I will discuss this with Dr Myer Haff when he returns to the office at the end of the week. I have advised her not to go to her pre-admit testing appointment scheduled for 07/05/23 and I have sent a message to PAT to cancel this appointment.

## 2023-07-02 NOTE — Progress Notes (Signed)
Name: Julie Wall   MRN: [id number]    DOB: 1956-04-14   Date:07/02/2023       Progress Note  Subjective  Chief Complaint  Chief Complaint  Patient presents with   Pre-op Exam   HPI   Pre-op requested from Dr. Myer Haff for L4-5 trans foraminal interbody fusion, right L5-S1 for lateral foraminotomy, L3-4 decompression under general anesthesia scheduled for Feb 12 th  2025, to control pain on low back pain with right lower back radiculitis. She has a history of thoracic spinal fusion without complications from anesthesia. She states the pain is affecting her mood.   Patient lost to follow up for her chronic medication conditions. Last visit was 09/2022.   Diabetes: A1C was 7.6 % back in April 2024 but lost to follow up and has not been taking medications on a regular basis, states just recently resumed taking Metformin 750 mg but not two daily and has been out of Jardiance for a Cortright time. A1C today is over 14 %. She denies polyphagia, polydipsia but has noticed polyuria   She also has associated obesity, dyslipidemia, HTN  and microalbuminuria. We stopped Losartan because her bp was low.  She takes statin therapy for dyslipidemia Discussed starting insulin to get glucose at goal but she refuses injectables    Eczema: she sees dermatologist at [facility], Dr. Amada Kingfisher  last visit was 08/2022   She is taking Methotrexate 7.5 mg weekly, and also folic acid She has also lost to follow up with Dermatologist    Hyperlipidemia/Atherosclerosis of aorta : she is back  Atorvastatin and aspirin daily , last LDL was 40 . Continue medication , we will recheck labs    Chronic bronchitis /Intesticial lung disease on CT chest : she had quit smoking 12/03/2016   but resumed smoking Fall 2019 S he quit again when she developed COVID-19 in 2021, but resumed smoking again since her sister died  Summer 2022 she has been smoking  half pack daily now, gradually going up on the number of cigarettes she smokes daily. She  uses to see Pulmonologist but not in a while    RLS: She sees neurologist, Dr. Sherryll Burger,  she was taking Requip but ran out of medication, still has magnesium at home, symptoms wakes her up during the night, advised her to call them back   Patient Active Problem List   Diagnosis Date Noted   Thoracic spondylosis with myelopathy 10/02/2022   Spinal stenosis of thoracic region 10/02/2022   S/P spinal fusion 10/02/2022   B12 deficiency 09/27/2021   Hypertension associated with diabetes (HCC) 09/27/2021   Diabetic polyneuropathy associated with type 2 diabetes mellitus (HCC)    Hilar lymphadenopathy 02/05/2020   History of COVID-19 02/05/2020   Atherosclerosis of aorta (HCC) 05/04/2017   Pap smear abnormality of cervix with ASCUS favoring benign 04/23/2017   Vitamin D deficiency 12/14/2016   Obesity (BMI 30.0-34.9) 12/13/2015   Chronic bronchitis (HCC) 05/14/2015   Diabetes mellitus (HCC) 05/14/2015   Positive H. pylori test 04/05/2015   Rapid urease test for Helicobacter pylori infection positive 04/05/2015   Umbilical hernia 04/01/2015   Exomphalos 04/01/2015   RLS (restless legs syndrome) 01/08/2015   Type 2 diabetes mellitus with hyperlipidemia (HCC) 01/08/2015   Neurogenic claudication 01/08/2015   AD (atopic dermatitis) 12/28/2014   Chronic low back pain 12/28/2014   Dyslipidemia 12/28/2014   Gastroesophageal reflux disease without esophagitis 12/28/2014   Microalbuminuria 12/28/2014   Disorder of bone and cartilage 12/28/2014   Neuralgia  of left thigh 12/28/2014   Perennial allergic rhinitis with seasonal variation 12/28/2014   Tobacco abuse 12/28/2014   Mononeuropathy of left lower extremity 12/28/2014   Hypertension, benign 09/22/2005    Past Surgical History:  Procedure Laterality Date   APPLICATION OF INTRAOPERATIVE CT SCAN N/A 10/02/2022   Procedure: APPLICATION OF INTRAOPERATIVE CT SCAN;  Surgeon: Venetia Night, MD;  Location: [facility] ORS;  Service: Neurosurgery;   Laterality: N/A;   CATARACT EXTRACTION Bilateral 06/05/1978   CESAREAN SECTION     x 2   CORNEAL TRANSPLANT Left    [facility]-Treatment for blindness.    Family History  Problem Relation Age of Onset   Diabetes Mother    Hypertension Mother    Pancreatic cancer Mother    Alcohol abuse Father    Arthritis/Rheumatoid Sister    Heart failure Sister    Diabetes Sister    Kidney disease Sister    Diabetes Brother    Breast cancer Neg Hx     Social History   Tobacco Use   Smoking status: Former    Current packs/day: 0.00    Average packs/day: 1 pack/day for 46.0 years (46.0 ttl pk-yrs)    Types: Cigarettes    Start date: 01/07/1985    Quit date: 10/01/2022    Years since quitting: 0.7   Smokeless tobacco: Never  Substance Use Topics   Alcohol use: No    Alcohol/week: 0.0 standard drinks of alcohol     Current Outpatient Medications:    ACCU-CHEK AVIVA PLUS test strip, CHECK BLOOD SUGAR ONCE DAILY, Disp: 100 each, Rfl: 2   albuterol (VENTOLIN HFA) 108 (90 Base) MCG/ACT inhaler, Inhale 2 puffs into the lungs every 6 (six) hours as needed for wheezing or shortness of breath., Disp: 18 g, Rfl: 0   AquaLance Lancets 30G MISC, CHECK BLOOD SUGAR 2 TIMES A DAY, Disp: 200 each, Rfl: 3   aspirin EC 81 MG tablet, Take 81 mg by mouth daily., Disp: , Rfl:    atorvastatin (LIPITOR) 40 MG tablet, Take 1 tablet (40 mg total) by mouth daily., Disp: 90 tablet, Rfl: 1   Blood Glucose Monitoring Suppl DEVI, 1 each by Does not apply route in the morning, at noon, and at bedtime. May substitute to any manufacturer covered by patient's insurance., Disp: 1 each, Rfl: 0   Cholecalciferol 50 MCG (2000 UT) CAPS, Take 2,000 Units by mouth daily., Disp: , Rfl:    empagliflozin (JARDIANCE) 25 MG TABS tablet, Take 1 tablet (25 mg total) by mouth daily., Disp: 90 tablet, Rfl: 1   Fluticasone-Umeclidin-Vilant (TRELEGY ELLIPTA) 200-62.5-25 MCG/ACT AEPB, Inhale 1 puff into the lungs daily., Disp: 28 each, Rfl: 11    folic acid (FOLVITE) 1 MG tablet, Take 1 mg by mouth See admin instructions. Take 1 mg by mouth daily except on Fridays, Disp: , Rfl:    gabapentin (NEURONTIN) 300 MG capsule, Take 1 capsule (300 mg total) by mouth 2 (two) times daily., Disp: 90 capsule, Rfl: 0   metFORMIN (GLUCOPHAGE-XR) 750 MG 24 hr tablet, TAKE 2 TABLETS (1,500 MG TOTAL) BY MOUTHDAILY WITH BREAKFAST, Disp: 180 tablet, Rfl: 1   methotrexate (RHEUMATREX) 2.5 MG tablet, Take 17.5 mg by mouth every Friday., Disp: , Rfl:    senna (SENOKOT) 8.6 MG TABS tablet, Take 1 tablet (8.6 mg total) by mouth 2 (two) times daily., Disp: 120 tablet, Rfl: 0   triamcinolone ointment (KENALOG) 0.1 %, Apply topically 2 (two) times daily. (Patient taking differently: Apply 1 Application topically 2 (  two) times daily as needed (itching/irritation).), Disp: 30 g, Rfl: 0   Cyanocobalamin (B-12) 1000 MCG SUBL, Place 1 tablet under the tongue daily. (Patient not taking: Reported on 06/28/2023), Disp: 100 tablet, Rfl: 1   methocarbamol (ROBAXIN) 500 MG tablet, Take 1 tablet (500 mg total) by mouth 4 (four) times daily. (Patient not taking: Reported on 06/28/2023), Disp: 120 tablet, Rfl: 0  Allergies  Allergen Reactions   Penicillins Rash    I personally reviewed active problem list, medication list, allergies, family history with the patient/caregiver today.   ROS  Ten systems reviewed and is negative except as mentioned in HPI    Objective  Vitals:   07/02/23 1308  BP: 134/72  Pulse: 95  Resp: 16  SpO2: 97%  Weight: 149 lb 11.2 oz (67.9 kg)  Height: 4\' 11"  (1.499 m)    Body mass index is 30.24 kg/m.  Physical Exam  Constitutional: Patient appears well-developed and well-nourished. Obese  No distress.  HEENT: head atraumatic, normocephalic, pupils equal and reactive to light,, neck supple Cardiovascular: Normal rate, regular rhythm and normal heart sounds.  No murmur heard. No BLE edema. Pulmonary/Chest: Effort normal and breath sounds  normal. No respiratory distress. Abdominal: Soft.  There is no tenderness. Psychiatric: Patient has a normal mood and affect. behavior is normal. Judgment and thought content normal.  Muscular skeletal: using a cane, feels weaker on right lower leg, no pain during palpation of lumbar spine, pain with rom  Diabetic Foot Exam:     PHQ2/9:    07/02/2023    1:01 PM 09/18/2022    3:23 PM 07/20/2022    9:21 AM 07/12/2022    4:22 PM 06/01/2022    9:15 AM  Depression screen PHQ 2/9  Decreased Interest 0 0 0 0 0  Down, Depressed, Hopeless 0 0 0 0 0  PHQ - 2 Score 0 0 0 0 0  Altered sleeping 0 0 0  0  Tired, decreased energy 0 0 0  0  Change in appetite 0 0 0  0  Feeling bad or failure about yourself  0 0 0  0  Trouble concentrating 0 0 0  0  Moving slowly or fidgety/restless 0 0 0  0  Suicidal thoughts 0 0 0  0  PHQ-9 Score 0 0 0  0  Difficult doing work/chores Not difficult at all  Not difficult at all  Not difficult at all    phq 9 is negative  Fall Risk:    07/02/2023    1:01 PM 09/18/2022    3:23 PM 07/20/2022    9:18 AM 07/12/2022    4:04 PM 06/01/2022    9:15 AM  Fall Risk   Falls in the past year? 0 0 0 0 0  Number falls in past yr: 0 0 0 0 0  Injury with Fall? 0 0 0 0 0  Risk for fall due to : No Fall Risks No Fall Risks No Fall Risks Medication side effect   Follow up Falls prevention discussed;Education provided;Falls evaluation completed Falls prevention discussed Education provided;Falls prevention discussed Falls prevention discussed      Assessment & Plan   1. DM type 2 with diabetic peripheral neuropathy (HCC) (Primary)  - POCT glycosylated hemoglobin (Hb A1C) - metFORMIN (GLUCOPHAGE-XR) 750 MG 24 hr tablet; TAKE 2 TABLETS (1,500 MG TOTAL) BY MOUTHDAILY WITH BREAKFAST  Dispense: 180 tablet; Refill: 0 - empagliflozin (JARDIANCE) 25 MG TABS tablet; Take 1 tablet (25 mg total) by mouth  daily.  Dispense: 90 tablet; Refill: 1 - Microalbumin / creatinine urine ratio -  COMPLETE METABOLIC PANEL WITH GFR - CBC with Differential/Platelet - Fructosamine  2. Atherosclerosis of aorta (HCC)  - Lipid panel  3. Interstitial lung disease (HCC)  Keep follow up with Pulmonologist   4. DM type 2 dyslipidemia (HCC)  Resume statin therapy   5. Simple chronic bronchitis (HCC)  Stable, she has been using Trelegy, needs to try quitting smoking again   6. Other eczema  Lost to follow up with Dermatologist   7. B12 deficiency  - B12 and Folate Panel  8. Pre-op exam  Cannot have surgery until A1C is better controlled, A1C today is over 14 %, we will resume medications, discussed dietary modification, return in 6 weeks and we will check fructosamine level and decide if she can have surgery at that time  9. Chronic bilateral low back pain with right-sided sciatica  Using a cane  10. RLS (restless legs syndrome)  Needs to go back to Dr. Sherryll Burger  11. Non-compliance  Discussed importance of regular visits , taking medications on a regular basis  12. Dyslipidemia  - atorvastatin (LIPITOR) 40 MG tablet; Take 1 tablet (40 mg total) by mouth daily.  Dispense: 90 tablet; Refill: 1

## 2023-07-05 ENCOUNTER — Inpatient Hospital Stay: Admission: RE | Admit: 2023-07-05 | Payer: 59 | Source: Ambulatory Visit

## 2023-07-05 MED ORDER — METHOCARBAMOL 500 MG PO TABS: 500.0000 mg | ORAL_TABLET | Freq: Three times a day (TID) | ORAL | 0 refills | Status: DC | PRN

## 2023-07-05 MED ORDER — GABAPENTIN 300 MG PO CAPS: 300.0000 mg | ORAL_CAPSULE | Freq: Three times a day (TID) | ORAL | 0 refills | Status: DC

## 2023-07-05 NOTE — Telephone Encounter (Addendum)
I spoke with Julie Wall and discussed that she will need to work diligently on gettting her blood sugar under control in order to proceed with surgery. She is aware that she will need to contact us once Dr Carlynn Purl says she is cleared for surgery and that this will require an acceptable fructosamine test, or an A1c of 7.5 or less.  She inquired about pain medication in the meantime. She states she is taking gabapentin 300mg  twice per day, but not anything else. Per discussion with Dr Myer Haff, we will increase her gabapentin from BID to TID and send in methocarbamol 500mg  TID. She is agreeable to this plan.  *Her dispense history shows she last filled gabapentin 12/12/2022. I called her back to confirm that she has been taking gabapentin 300mg  twice per day. She confirms this, and states she took her last one yesterday and that she had a bunch left over that she had been using. I reiterated that if she hasn't been taking it and we send in 300mg  three times per day, it can make her very drowsy. She again confirmed she had been taking it twice per day and ran out yesterday. I reviewed this with Dr Myer Haff and he confirmed it is OK to send in a script for 300mg  TID.  Medical [facility] [address]

## 2023-07-05 NOTE — Telephone Encounter (Signed)
Per further discussion with Dr Myer Haff, surgery will need to be canceled at this time. She will need to have an A1c of 7.5 or less to proceed with surgery, or she can have a fructosamine test in 6 weeks if her sugars are consistently 180 or less. Dr Carlynn Purl has already ordered the fructosamie test to be done in 6 weeks. If this is acceptable, we will proceed with rescheduling surgery at that time.

## 2023-07-18 ENCOUNTER — Inpatient Hospital Stay: Admission: RE | Admit: 2023-07-18 | Payer: 59 | Source: Home / Self Care | Admitting: Neurosurgery

## 2023-07-18 ENCOUNTER — Encounter: Admission: RE | Payer: Self-pay | Source: Home / Self Care

## 2023-07-18 SURGERY — LUMBAR LAMINECTOMY/DECOMPRESSION MICRODISCECTOMY 1 LEVEL
Anesthesia: General | Laterality: Right

## 2023-07-24 ENCOUNTER — Ambulatory Visit: Payer: 59

## 2023-07-24 DIAGNOSIS — Z780 Asymptomatic menopausal state: Secondary | ICD-10-CM

## 2023-07-24 DIAGNOSIS — Z0000 Encounter for general adult medical examination without abnormal findings: Secondary | ICD-10-CM | POA: Diagnosis not present

## 2023-07-24 DIAGNOSIS — Z1231 Encounter for screening mammogram for malignant neoplasm of breast: Secondary | ICD-10-CM

## 2023-07-24 NOTE — Progress Notes (Signed)
Subjective:   Julie Wall is a 68 y.o. female who presents for Medicare Annual (Subsequent) preventive examination.  Visit Complete: Virtual I connected with  Julie Wall on 07/24/23 by a audio enabled telemedicine application and verified that I am speaking with the correct person using two identifiers.  This patient declined Interactive audio and Acupuncturist. Therefore the visit was completed with audio only.   Patient Location: Home  Provider Location: Office/Clinic  I discussed the limitations of evaluation and management by telemedicine. The patient expressed understanding and agreed to proceed.  Vital Signs: Because this visit was a virtual/telehealth visit, some criteria may be missing or patient reported. Any vitals not documented were not able to be obtained and vitals that have been documented are patient reported.  Cardiac Risk Factors include: advanced age (>92men, >88 women);diabetes mellitus;dyslipidemia;hypertension;obesity (BMI >30kg/m2);microalbuminuria;smoking/ tobacco exposure     Objective:    Today's Vitals   07/24/23 0938  PainSc: 4    There is no height or weight on file to calculate BMI.     07/24/2023    9:43 AM 10/02/2022    9:42 AM 09/19/2022    8:22 AM 07/20/2022    9:30 AM 07/19/2021    9:32 AM 07/01/2020    3:35 PM 06/12/2019    8:57 AM  Advanced Directives  Does Patient Have a Medical Advance Directive? No No No No No No No  Would patient like information on creating a medical advance directive? No - Patient declined No - Patient declined   No - Patient declined No - Patient declined No - Patient declined    Current Medications (verified) Outpatient Encounter Medications as of 07/24/2023  Medication Sig   ACCU-CHEK AVIVA PLUS test strip CHECK BLOOD SUGAR ONCE DAILY   albuterol (VENTOLIN HFA) 108 (90 Base) MCG/ACT inhaler Inhale 2 puffs into the lungs every 6 (six) hours as needed for wheezing or shortness of breath.   AquaLance  Lancets 30G MISC CHECK BLOOD SUGAR 2 TIMES A DAY   aspirin EC 81 MG tablet Take 81 mg by mouth daily.   atorvastatin (LIPITOR) 40 MG tablet Take 1 tablet (40 mg total) by mouth daily.   Blood Glucose Monitoring Suppl DEVI 1 each by Does not apply route in the morning, at noon, and at bedtime. May substitute to any manufacturer covered by patient's insurance.   Cholecalciferol 50 MCG (2000 UT) CAPS Take 2,000 Units by mouth daily.   Cyanocobalamin (B-12) 1000 MCG SUBL Place 1 tablet under the tongue daily.   empagliflozin (JARDIANCE) 25 MG TABS tablet Take 1 tablet (25 mg total) by mouth daily.   Fluticasone-Umeclidin-Vilant (TRELEGY ELLIPTA) 200-62.5-25 MCG/ACT AEPB Inhale 1 puff into the lungs daily.   folic acid (FOLVITE) 1 MG tablet Take 1 mg by mouth See admin instructions. Take 1 mg by mouth daily except on Fridays   gabapentin (NEURONTIN) 300 MG capsule Take 1 capsule (300 mg total) by mouth 3 (three) times daily.   metFORMIN (GLUCOPHAGE-XR) 750 MG 24 hr tablet TAKE 2 TABLETS (1,500 MG TOTAL) BY MOUTHDAILY WITH BREAKFAST   methocarbamol (ROBAXIN) 500 MG tablet Take 1 tablet (500 mg total) by mouth every 8 (eight) hours as needed for muscle spasms.   methotrexate (RHEUMATREX) 2.5 MG tablet Take 17.5 mg by mouth every Friday.   senna (SENOKOT) 8.6 MG TABS tablet Take 1 tablet (8.6 mg total) by mouth 2 (two) times daily.   triamcinolone ointment (KENALOG) 0.1 % Apply topically 2 (two) times daily. (Patient taking  differently: Apply 1 Application topically 2 (two) times daily as needed (itching/irritation).)   No facility-administered encounter medications on file as of 07/24/2023.    Allergies (verified) Penicillins   History: Past Medical History:  Diagnosis Date   Allergy    Arthritis    ASCUS favor benign 09/2013   negative HPV   Atopic dermatitis    Dermatologist at [facility]   Chronic low back pain    COPD (chronic obstructive pulmonary disease) (HCC)    Decreased exercise tolerance     Dental caries    Diabetes mellitus without complication (HCC)    Dyspnea    GERD (gastroesophageal reflux disease)    Hand dermatitis 10/13/2021   a.) PCR (+) 10/13/2021 and 08/17/2022 for MSSA + SDSE (Streptococcus dysgalactiae ss equisimilis)   Hyperlipidemia    Hypertension    Legally blind in left eye, as defined in [place]    Lumbosacral neuritis    Microalbuminuria    Osteopenia    Ovarian failure    Sleep apnea    no cpap   Tobacco use    Past Surgical History:  Procedure Laterality Date   APPLICATION OF INTRAOPERATIVE CT SCAN N/A 10/02/2022   Procedure: APPLICATION OF INTRAOPERATIVE CT SCAN;  Surgeon: Venetia Night, MD;  Location: [facility] ORS;  Service: Neurosurgery;  Laterality: N/A;   CATARACT EXTRACTION Bilateral 06/05/1978   CESAREAN SECTION     x 2   CORNEAL TRANSPLANT Left    [facility]-Treatment for blindness.   Family History  Problem Relation Age of Onset   Diabetes Mother    Hypertension Mother    Pancreatic cancer Mother    Alcohol abuse Father    Arthritis/Rheumatoid Sister    Heart failure Sister    Diabetes Sister    Kidney disease Sister    Diabetes Brother    Breast cancer Neg Hx    Social History   Socioeconomic History   Marital status: Single    Spouse name: Not on file   Number of children: 3   Years of education: Not on file   Highest education level: High school graduate  Occupational History   Not on file  Tobacco Use   Smoking status: Former    Current packs/day: 0.00    Average packs/day: 1 pack/day for 46.0 years (46.0 ttl pk-yrs)    Types: Cigarettes    Start date: 01/07/1985    Quit date: 10/01/2022    Years since quitting: 0.8   Smokeless tobacco: Never  Vaping Use   Vaping status: Never Used  Substance and Sexual Activity   Alcohol use: No    Alcohol/week: 0.0 standard drinks of alcohol   Drug use: No   Sexual activity: Not Currently  Other Topics Concern   Not on file  Social History Narrative   Disabled from severe  atopic dermatitis   Has 3 children   Lives alone but very connect with family ( sees sister and her children daily)   Also belongs to a church   Social Drivers of Corporate investment banker Strain: Low Risk  (07/24/2023)   Overall Financial Resource Strain (CARDIA)    Difficulty of Paying Living Expenses: Not hard at all  Food Insecurity: No Food Insecurity (07/24/2023)   Hunger Vital Sign    Worried About Running Out of Food in the Last Year: Never true    Ran Out of Food in the Last Year: Never true  Transportation Needs: No Transportation Needs (07/24/2023)   PRAPARE -  Administrator, Civil Service (Medical): No    Lack of Transportation (Non-Medical): No  Physical Activity: Sufficiently Active (07/24/2023)   Exercise Vital Sign    Days of Exercise per Week: 6 days    Minutes of Exercise per Session: 30 min  Stress: No Stress Concern Present (07/24/2023)   [employer] of Occupational Health - Occupational Stress Questionnaire    Feeling of Stress : Not at all  Social Connections: Moderately Isolated (07/24/2023)   Social Connection and Isolation Panel [NHANES]    Frequency of Communication with Friends and Family: More than three times a week    Frequency of Social Gatherings with Friends and Family: Three times a week    Attends Religious Services: More than 4 times per year    Active Member of Clubs or Organizations: No    Attends Banker Meetings: Never    Marital Status: Never married    Tobacco Counseling Counseling given: Not Answered   Clinical Intake:  Pre-visit preparation completed: Yes  Pain : 0-10 Pain Score: 4  Pain Type: Chronic pain Pain Location: Leg Pain Orientation: Right Pain Descriptors / Indicators: Numbness, Tingling Pain Onset: More than a month ago Pain Frequency: Constant     BMI - recorded: 30.1 Nutritional Status: BMI > 30  Obese Nutritional Risks: None Diabetes: Yes CBG done?: No Did pt. bring in CBG  monitor from home?: No  How often do you need to have someone help you when you read instructions, pamphlets, or other written materials from your doctor or pharmacy?: 1 - Never  Interpreter Needed?: No  Information entered by :: Kennedy Bucker, LPN   Activities of Daily Living    07/24/2023    9:44 AM 10/02/2022    4:14 PM  In your present state of health, do you have any difficulty performing the following activities:  Hearing? 0   Vision? 0   Difficulty concentrating or making decisions? 0   Walking or climbing stairs? 1   Comment NUMB LEG   Dressing or bathing? 0   Doing errands, shopping? 1 0  Preparing Food and eating ? N   Using the Toilet? N   In the past six months, have you accidently leaked urine? N   Do you have problems with loss of bowel control? N   Managing your Medications? N   Managing your Finances? N   Housekeeping or managing your Housekeeping? N     Patient Care Team: Alba Cory, MD as PCP - General (Family Medicine) Herschel Senegal, MD as Referring Physician (Dermatology) Lonell Face, MD as Consulting Physician (Neurology)  Indicate any recent Medical Services you may have received from other than [facility] providers in the past year (date may be approximate).     Assessment:   This is a routine wellness examination for Salimah.  Hearing/Vision screen Hearing Screening - Comments:: NO AIDS Vision Screening - Comments:: [facility]- DR.BRENNAN   Goals Addressed             This Visit's Progress    DIET - EAT MORE FRUITS AND VEGETABLES         Depression Screen    07/24/2023    9:42 AM 07/02/2023    1:01 PM 09/18/2022    3:23 PM 07/20/2022    9:21 AM 07/12/2022    4:22 PM 06/01/2022    9:15 AM 05/25/2022   10:24 AM  PHQ 2/9 Scores  PHQ - 2 Score 0 0 0 0  0 0 0  PHQ- 9 Score 0 0 0 0  0 0    Fall Risk    07/24/2023    9:43 AM 07/02/2023    1:01 PM 09/18/2022    3:23 PM 07/20/2022    9:18 AM 07/12/2022    4:04 PM  Fall Risk   Falls  in the past year? 0 0 0 0 0  Number falls in past yr: 1 0 0 0 0  Injury with Fall? 0 0 0 0 0  Risk for fall due to : History of fall(s);Impaired mobility;Impaired balance/gait No Fall Risks No Fall Risks No Fall Risks Medication side effect  Follow up Falls prevention discussed;Falls evaluation completed Falls prevention discussed;Education provided;Falls evaluation completed Falls prevention discussed Education provided;Falls prevention discussed Falls prevention discussed    MEDICARE RISK AT HOME: Medicare Risk at Home Any stairs in or around the home?: Yes If so, are there any without handrails?: No Home free of loose throw rugs in walkways, pet beds, electrical cords, etc?: Yes Adequate lighting in your home to reduce risk of falls?: Yes Life alert?: No Use of a cane, walker or w/c?: Yes (CANE ALL THE TIME) Grab bars in the bathroom?: Yes Shower chair or bench in shower?: Yes Elevated toilet seat or a handicapped toilet?: Yes  TIMED UP AND GO:  Was the test performed?  No    Cognitive Function:        07/24/2023    9:46 AM 07/20/2022    9:33 AM 06/12/2019    9:01 AM 05/23/2018    9:24 AM  6CIT Screen  What Year? 0 points 0 points 0 points 0 points  What month? 0 points 0 points 0 points 0 points  What time? 0 points 0 points 0 points 0 points  Count back from 20 0 points 0 points 0 points 0 points  Months in reverse 4 points 4 points 0 points 0 points  Repeat phrase 0 points 0 points 6 points 4 points  Total Score 4 points 4 points 6 points 4 points    Immunizations Immunization History  Administered Date(s) Administered   Fluad Quad(high Dose 65+) 03/02/2020, 05/25/2022   Influenza Split 04/09/2012   Influenza, High Dose Seasonal PF 02/22/2021   Influenza, Seasonal, Injecte, Preservative Fre 02/17/2011   Influenza,inj,Quad PF,6+ Mos 07/16/2013, 03/23/2014, 04/01/2015, 03/14/2016, 03/16/2017, 02/19/2018, 05/07/2019   Influenza-Unspecified 07/16/2013, 03/23/2014,  04/01/2015, 03/14/2016, 03/16/2017, 02/19/2018, 05/07/2019   Pneumococcal Conjugate-13 04/01/2015   Pneumococcal Polysaccharide-23 01/26/2010, 09/05/2019   Tdap 01/25/2012, 05/25/2022   Zoster, Live 01/08/2015    TDAP status: Up to date  Flu Vaccine status: Declined, Education has been provided regarding the importance of this vaccine but patient still declined. Advised may receive this vaccine at local pharmacy or Health Dept. Aware to provide a copy of the vaccination record if obtained from local pharmacy or Health Dept. Verbalized acceptance and understanding.  Pneumococcal vaccine status: Up to date  Covid-19 vaccine status: Declined, Education has been provided regarding the importance of this vaccine but patient still declined. Advised may receive this vaccine at local pharmacy or Health Dept.or vaccine clinic. Aware to provide a copy of the vaccination record if obtained from local pharmacy or Health Dept. Verbalized acceptance and understanding.  Qualifies for Shingles Vaccine? Yes   Zostavax completed Yes   Shingrix Completed?: No.    Education has been provided regarding the importance of this vaccine. Patient has been advised to call insurance company to determine out of pocket expense  if they have not yet received this vaccine. Advised may also receive vaccine at local pharmacy or Health Dept. Verbalized acceptance and understanding.  Screening Tests Health Maintenance  Topic Date Due   COVID-19 Vaccine (1) Never done   Colonoscopy  07/05/2020   MAMMOGRAM  09/16/2021   FOOT EXAM  06/29/2022   Diabetic kidney evaluation - Urine ACR  09/28/2022   Cervical Cancer Screening (Pap smear)  05/26/2023   INFLUENZA VACCINE  09/03/2023 (Originally 01/04/2023)   Zoster Vaccines- Shingrix (1 of 2) 09/30/2023 (Originally 08/06/1974)   Diabetic kidney evaluation - eGFR measurement  09/19/2023   OPHTHALMOLOGY EXAM  11/07/2023   HEMOGLOBIN A1C  12/30/2023   Lung Cancer Screening  06/13/2024    Medicare Annual Wellness (AWV)  07/23/2024   DTaP/Tdap/Td (3 - Td or Tdap) 05/25/2032   Pneumonia Vaccine 67+ Years old  Completed   DEXA SCAN  Completed   Hepatitis C Screening  Completed   HPV VACCINES  Aged Out    Health Maintenance  Health Maintenance Due  Topic Date Due   COVID-19 Vaccine (1) Never done   Colonoscopy  07/05/2020   MAMMOGRAM  09/16/2021   FOOT EXAM  06/29/2022   Diabetic kidney evaluation - Urine ACR  09/28/2022   Cervical Cancer Screening (Pap smear)  05/26/2023    DECLINED REFERRAL FOR COLONOSCOPY  Mammogram status: Ordered 07/24/23. Pt provided with contact info and advised to call to schedule appt.   Bone Density status: Ordered 07/24/23. Pt provided with contact info and advised to call to schedule appt.  Lung Cancer Screening: (Low Dose CT Chest recommended if Age 60-80 years, 20 pack-year currently smoking OR have quit w/in 15years.) does qualify.   Lung Cancer Screening Referral: CT DONE 06/14/23  Additional Screening:  Hepatitis C Screening: does qualify; Completed 01/27/13  Vision Screening: Recommended annual ophthalmology exams for early detection of glaucoma and other disorders of the eye. Is the patient up to date with their annual eye exam?  Yes  Who is the provider or what is the name of the office in which the patient attends annual eye exams? DR.BRENNAN If pt is not established with a provider, would they like to be referred to a provider to establish care? No .   Dental Screening: Recommended annual dental exams for proper oral hygiene  Diabetic Foot Exam: Diabetic Foot Exam: Completed 06/29/21  Community Resource Referral / Chronic Care Management: CRR required this visit?  No   CCM required this visit?  No     Plan:     I have personally reviewed and noted the following in the patient's chart:   Medical and social history Use of alcohol, tobacco or illicit drugs  Current medications and supplements including opioid  prescriptions. Patient is not currently taking opioid prescriptions. Functional ability and status Nutritional status Physical activity Advanced directives List of other physicians Hospitalizations, surgeries, and ER visits in previous 12 months Vitals Screenings to include cognitive, depression, and falls Referrals and appointments  In addition, I have reviewed and discussed with patient certain preventive protocols, quality metrics, and best practice recommendations. A written personalized care plan for preventive services as well as general preventive health recommendations were provided to patient.     Hal Hope, LPN   07/05/8655   After Visit Summary: (MyChart) Due to this being a telephonic visit, the after visit summary with patients personalized plan was offered to patient via MyChart   Nurse Notes: REFERRAL SENT FOR BDS AND MAMMOGRAM

## 2023-07-24 NOTE — Patient Instructions (Addendum)
Ms. Penny , Thank you for taking time to come for your Medicare Wellness Visit. I appreciate your ongoing commitment to your health goals. Please review the following plan we discussed and let me know if I can assist you in the future.   Referrals/Orders/Follow-Ups/Clinician Recommendations: REFERRALS SENT FOR BONE DENSITY SCAN & MAMMOGRAM  You have an order for:  []   2D Mammogram  [x]   3D Mammogram  [x]   Bone Density     Please call for appointment:  [facility]  [address] [place] [address] [phone] [facility] [address] [place], [place] [address] [phone] [facility] [address] Geanie Logan [place], [place] [address] [phone]   Make sure to wear two-piece clothing.  No lotions, powders, or deodorants the day of the appointment. Make sure to bring picture ID and insurance card.  Bring list of medications you are currently taking including any supplements.   Schedule your [place] screening mammogram through MyChart!   Log into your MyChart account.  Go to 'Visit' (or 'Appointments' if on mobile App) --> Schedule an Appointment  Under 'Select a Reason for Visit' choose the Mammogram Screening option.  Complete the pre-visit questions and select the time and place that best fits your schedule.   This is a list of the screening recommended for you and due dates:  Health Maintenance  Topic Date Due   COVID-19 Vaccine (1) Never done   Colon Cancer Screening  07/05/2020   Mammogram  09/16/2021   Complete foot exam   06/29/2022   Yearly kidney health urinalysis for diabetes  09/28/2022   Pap Smear  05/26/2023   Flu Shot  09/03/2023*   Zoster (Shingles) Vaccine (1 of 2) 09/30/2023*   Yearly kidney function blood test for diabetes  09/19/2023   Eye exam for diabetics  11/07/2023   Hemoglobin A1C  12/30/2023   Screening for Lung  Cancer  06/13/2024   Medicare Annual Wellness Visit  07/23/2024   DTaP/Tdap/Td vaccine (3 - Td or Tdap) 05/25/2032   Pneumonia Vaccine  Completed   DEXA scan (bone density measurement)  Completed   Hepatitis C Screening  Completed   HPV Vaccine  Aged Out  *Topic was postponed. The date shown is not the original due date.    Advanced directives: (ACP Link)Information on Advanced Care Planning can be found at [facility] Advance Health Care Directives Advance Health Care Directives ([email or web address]   Next Medicare Annual Wellness Visit scheduled for next year: Yes   07/31/24 @ 9:30 AM BY PHONE

## 2023-08-02 ENCOUNTER — Encounter: Payer: 59 | Admitting: Neurosurgery

## 2023-08-02 NOTE — Code Documentation (Signed)
 Hi Rhonda, I do all kinds of visits from different places on any given day. Why can't these two patients you sent me go to [facility]'s billing? That's who these patients belong to...not [facility].Marland KitchenMarland Kitchen

## 2023-08-10 ENCOUNTER — Ambulatory Visit: Admitting: Family Medicine

## 2023-08-10 ENCOUNTER — Encounter: Payer: Self-pay | Admitting: Family Medicine

## 2023-08-10 VITALS — BP 130/74 | HR 97 | Resp 16 | Ht 59.0 in | Wt 150.1 lb

## 2023-08-10 DIAGNOSIS — M546 Pain in thoracic spine: Secondary | ICD-10-CM

## 2023-08-10 DIAGNOSIS — E1165 Type 2 diabetes mellitus with hyperglycemia: Secondary | ICD-10-CM

## 2023-08-10 MED ORDER — LIDOCAINE HCL (PF) 1 % IJ SOLN
2.0000 mL | Freq: Once | INTRAMUSCULAR | Status: AC
Start: 2023-08-10 — End: 2023-08-10
  Administered 2023-08-10: 2 mL

## 2023-08-10 NOTE — Progress Notes (Signed)
 Name: Julie Wall   MRN: [id number]    DOB: 1956-02-20   Date:08/10/2023       Progress Note  Subjective  Chief Complaint  Chief Complaint  Patient presents with   Back Pain    Upper R back onset for 3 weeks, not any better, it can radiate to the chest area    Discussed the use of AI scribe software for clinical note transcription with the patient, who gave verbal consent to proceed.  History of Present Illness   The patient presents with upper back pain and diabetes management for surgical clearance.  She has been experiencing upper back pain for the past three weeks, localized around the shoulder blade. The pain worsens with movement and touch. She also reports numbness that began in her right arm and has progressed to her upper back but not the neck, though the numbness has decreased. No weakness in her arms is noted. She has been using muscle relaxers at home for relief.  She is managing her diabetes in preparation for a lower back surgery that was previously canceled due to uncontrolled A1c levels. She takes her medication daily and notes improvement in her fasting blood sugar levels, with recent readings of 141 mg/dL and as low as 034-742 mg/dL in the mornings. She will get fructosamine level today so we can decide if she can progress with surgery        Patient Active Problem List   Diagnosis Date Noted   Thoracic spondylosis with myelopathy 10/02/2022   Spinal stenosis of thoracic region 10/02/2022   S/P spinal fusion 10/02/2022   B12 deficiency 09/27/2021   Hypertension associated with diabetes (HCC) 09/27/2021   Diabetic polyneuropathy associated with type 2 diabetes mellitus (HCC)    Hilar lymphadenopathy 02/05/2020   History of COVID-19 02/05/2020   Atherosclerosis of aorta (HCC) 05/04/2017   Pap smear abnormality of cervix with ASCUS favoring benign 04/23/2017   Vitamin D deficiency 12/14/2016   Obesity (BMI 30.0-34.9) 12/13/2015   Chronic bronchitis (HCC) 05/14/2015    Positive H. pylori test 04/05/2015   Rapid urease test for Helicobacter pylori infection positive 04/05/2015   Umbilical hernia 04/01/2015   Exomphalos 04/01/2015   RLS (restless legs syndrome) 01/08/2015   Type 2 diabetes mellitus with hyperlipidemia (HCC) 01/08/2015   Neurogenic claudication 01/08/2015   AD (atopic dermatitis) 12/28/2014   Chronic low back pain 12/28/2014   Dyslipidemia 12/28/2014   Gastroesophageal reflux disease without esophagitis 12/28/2014   Microalbuminuria 12/28/2014   Disorder of bone and cartilage 12/28/2014   Neuralgia of left thigh 12/28/2014   Perennial allergic rhinitis with seasonal variation 12/28/2014   Tobacco abuse 12/28/2014   Mononeuropathy of left lower extremity 12/28/2014   Hypertension, benign 09/22/2005    Social History   Tobacco Use   Smoking status: Former    Current packs/day: 0.00    Average packs/day: 1 pack/day for 46.0 years (46.0 ttl pk-yrs)    Types: Cigarettes    Start date: 01/07/1985    Quit date: 10/01/2022    Years since quitting: 0.8   Smokeless tobacco: Never  Substance Use Topics   Alcohol use: No    Alcohol/week: 0.0 standard drinks of alcohol     Current Outpatient Medications:    ACCU-CHEK AVIVA PLUS test strip, CHECK BLOOD SUGAR ONCE DAILY, Disp: 100 each, Rfl: 2   albuterol (VENTOLIN HFA) 108 (90 Base) MCG/ACT inhaler, Inhale 2 puffs into the lungs every 6 (six) hours as needed for wheezing or shortness of  breath., Disp: 18 g, Rfl: 0   AquaLance Lancets 30G MISC, CHECK BLOOD SUGAR 2 TIMES A DAY, Disp: 200 each, Rfl: 3   aspirin EC 81 MG tablet, Take 81 mg by mouth daily., Disp: , Rfl:    atorvastatin (LIPITOR) 40 MG tablet, Take 1 tablet (40 mg total) by mouth daily., Disp: 90 tablet, Rfl: 1   Blood Glucose Monitoring Suppl DEVI, 1 each by Does not apply route in the morning, at noon, and at bedtime. May substitute to any manufacturer covered by patient's insurance., Disp: 1 each, Rfl: 0   Cholecalciferol  50 MCG (2000 UT) CAPS, Take 2,000 Units by mouth daily., Disp: , Rfl:    Cyanocobalamin (B-12) 1000 MCG SUBL, Place 1 tablet under the tongue daily., Disp: 100 tablet, Rfl: 1   empagliflozin (JARDIANCE) 25 MG TABS tablet, Take 1 tablet (25 mg total) by mouth daily., Disp: 90 tablet, Rfl: 1   Fluticasone-Umeclidin-Vilant (TRELEGY ELLIPTA) 200-62.5-25 MCG/ACT AEPB, Inhale 1 puff into the lungs daily., Disp: 28 each, Rfl: 11   folic acid (FOLVITE) 1 MG tablet, Take 1 mg by mouth See admin instructions. Take 1 mg by mouth daily except on Fridays, Disp: , Rfl:    gabapentin (NEURONTIN) 300 MG capsule, Take 1 capsule (300 mg total) by mouth 3 (three) times daily., Disp: 90 capsule, Rfl: 0   metFORMIN (GLUCOPHAGE-XR) 750 MG 24 hr tablet, TAKE 2 TABLETS (1,500 MG TOTAL) BY MOUTHDAILY WITH BREAKFAST, Disp: 180 tablet, Rfl: 0   methocarbamol (ROBAXIN) 500 MG tablet, Take 1 tablet (500 mg total) by mouth every 8 (eight) hours as needed for muscle spasms., Disp: 90 tablet, Rfl: 0   methotrexate (RHEUMATREX) 2.5 MG tablet, Take 17.5 mg by mouth every Friday., Disp: , Rfl:    senna (SENOKOT) 8.6 MG TABS tablet, Take 1 tablet (8.6 mg total) by mouth 2 (two) times daily., Disp: 120 tablet, Rfl: 0   triamcinolone ointment (KENALOG) 0.1 %, Apply topically 2 (two) times daily. (Patient taking differently: Apply 1 Application topically 2 (two) times daily as needed (itching/irritation).), Disp: 30 g, Rfl: 0  Allergies  Allergen Reactions   Penicillins Rash    ROS  Ten systems reviewed and is negative except as mentioned in HPI    Objective  Vitals:   08/10/23 1044  BP: 130/74  Pulse: 97  Resp: 16  SpO2: 99%  Weight: 150 lb 1.6 oz (68.1 kg)  Height: 4\' 11"  (1.499 m)    Body mass index is 30.32 kg/m.    Physical Exam  Constitutional: Patient appears well-developed and well-nourished. Obese  No distress.  HEENT: head atraumatic, normocephalic, pupils equal and reactive to light, neck  supple Cardiovascular: Normal rate, regular rhythm and normal heart sounds.  No murmur heard. No BLE edema. Pulmonary/Chest: Effort normal and breath sounds normal. No respiratory distress. Abdominal: Soft.  There is no tenderness. Muscular skeletal: spasms on thoracic spine right side, normal ron of right shoulder and neck. Very tender to touch on right scapular area . Psychiatric: Patient has a normal mood and affect. behavior is normal. Judgment and thought content normal.   Recent Results (from the past 2160 hours)  POCT glycosylated hemoglobin (Hb A1C)     Status: None   Collection Time: 07/02/23  1:29 PM  Result Value Ref Range   Hemoglobin A1C     HbA1c POC (<> result, manual entry) >14.0 4.0 - 5.6 %   HbA1c, POC (prediabetic range)     HbA1c, POC (controlled diabetic range)  Assessment and Plan    Uncontrolled Diabetes Mellitus type II with hyperglycemia  Recent improvement in home glucose readings since adherence to medication. A1c previously elevated, leading to postponement of lower back surgery. -Order fructosamine today to assess recent glycemic control. -If fructosamine is within range, clear patient for back surgery.  Upper Back Pain New onset of pain in the right scapular area with radiation to the right arm and neck. No weakness or sensory changes. Pain exacerbated by movement and touch. -Administer trigger point injection with lidocaine in the scapular area to alleviate muscle spasm. -Advise against taking other people's prescription medication ( took a medication from her daughter last night - not sure of the name) -Plan to reassess after intervention.     Consent form signed Localized muscle group  Injection with lidocaine 1% 2 ml, no kenalog due to DM Patient tolerated procedure well No side effects

## 2023-08-10 NOTE — Progress Notes (Addendum)
 Subjective:   Julie Wall is a 68 y.o. female who presents for Medicare Annual (Subsequent) preventive examination.  Visit Complete: Virtual I connected with  Julie Wall on 07/24/2023 by a audio enabled telemedicine application and verified that I am speaking with the correct person using two identifiers.  This patient declined Interactive audio and Acupuncturist. Therefore the visit was completed with audio only.   Patient Location: Home  Provider Location: Office/Clinic  I discussed the limitations of evaluation and management by telemedicine. The patient expressed understanding and agreed to proceed.  Vital Signs: Because this visit was a virtual/telehealth visit, some criteria may be missing or patient reported. Any vitals not documented were not able to be obtained and vitals that have been documented are patient reported.  Cardiac Risk Factors include: advanced age (>70men, >3 women);diabetes mellitus;dyslipidemia;hypertension;obesity (BMI >30kg/m2);microalbuminuria;smoking/ tobacco exposure     Objective:    Today's Vitals   07/24/23 0938  PainSc: 4    There is no height or weight on file to calculate BMI.     07/24/2023    9:43 AM 10/02/2022    9:42 AM 09/19/2022    8:22 AM 07/20/2022    9:30 AM 07/19/2021    9:32 AM 07/01/2020    3:35 PM 06/12/2019    8:57 AM  Advanced Directives  Does Patient Have a Medical Advance Directive? No No No No No No No  Would patient like information on creating a medical advance directive? No - Patient declined No - Patient declined   No - Patient declined No - Patient declined No - Patient declined    Current Medications (verified) Outpatient Encounter Medications as of 07/24/2023  Medication Sig   ACCU-CHEK AVIVA PLUS test strip CHECK BLOOD SUGAR ONCE DAILY   albuterol (VENTOLIN HFA) 108 (90 Base) MCG/ACT inhaler Inhale 2 puffs into the lungs every 6 (six) hours as needed for wheezing or shortness of breath.   AquaLance  Lancets 30G MISC CHECK BLOOD SUGAR 2 TIMES A DAY   aspirin EC 81 MG tablet Take 81 mg by mouth daily.   atorvastatin (LIPITOR) 40 MG tablet Take 1 tablet (40 mg total) by mouth daily.   Blood Glucose Monitoring Suppl DEVI 1 each by Does not apply route in the morning, at noon, and at bedtime. May substitute to any manufacturer covered by patient's insurance.   Cholecalciferol 50 MCG (2000 UT) CAPS Take 2,000 Units by mouth daily.   Cyanocobalamin (B-12) 1000 MCG SUBL Place 1 tablet under the tongue daily.   empagliflozin (JARDIANCE) 25 MG TABS tablet Take 1 tablet (25 mg total) by mouth daily.   Fluticasone-Umeclidin-Vilant (TRELEGY ELLIPTA) 200-62.5-25 MCG/ACT AEPB Inhale 1 puff into the lungs daily.   folic acid (FOLVITE) 1 MG tablet Take 1 mg by mouth See admin instructions. Take 1 mg by mouth daily except on Fridays   gabapentin (NEURONTIN) 300 MG capsule Take 1 capsule (300 mg total) by mouth 3 (three) times daily.   metFORMIN (GLUCOPHAGE-XR) 750 MG 24 hr tablet TAKE 2 TABLETS (1,500 MG TOTAL) BY MOUTHDAILY WITH BREAKFAST   methocarbamol (ROBAXIN) 500 MG tablet Take 1 tablet (500 mg total) by mouth every 8 (eight) hours as needed for muscle spasms.   methotrexate (RHEUMATREX) 2.5 MG tablet Take 17.5 mg by mouth every Friday.   senna (SENOKOT) 8.6 MG TABS tablet Take 1 tablet (8.6 mg total) by mouth 2 (two) times daily.   triamcinolone ointment (KENALOG) 0.1 % Apply topically 2 (two) times daily. (Patient taking differently:  Apply 1 Application topically 2 (two) times daily as needed (itching/irritation).)   No facility-administered encounter medications on file as of 07/24/2023.    Allergies (verified) Penicillins   History: Past Medical History:  Diagnosis Date   Allergy    Arthritis    ASCUS favor benign 09/2013   negative HPV   Atopic dermatitis    Dermatologist at [facility]   Chronic low back pain    COPD (chronic obstructive pulmonary disease) (HCC)    Decreased exercise tolerance     Dental caries    Diabetes mellitus without complication (HCC)    Dyspnea    GERD (gastroesophageal reflux disease)    Hand dermatitis 10/13/2021   a.) PCR (+) 10/13/2021 and 08/17/2022 for MSSA + SDSE (Streptococcus dysgalactiae ss equisimilis)   Hyperlipidemia    Hypertension    Legally blind in left eye, as defined in [place]    Lumbosacral neuritis    Microalbuminuria    Osteopenia    Ovarian failure    Sleep apnea    no cpap   Tobacco use    Past Surgical History:  Procedure Laterality Date   APPLICATION OF INTRAOPERATIVE CT SCAN N/A 10/02/2022   Procedure: APPLICATION OF INTRAOPERATIVE CT SCAN;  Surgeon: Venetia Night, MD;  Location: [facility] ORS;  Service: Neurosurgery;  Laterality: N/A;   CATARACT EXTRACTION Bilateral 06/05/1978   CESAREAN SECTION     x 2   CORNEAL TRANSPLANT Left    [facility]-Treatment for blindness.   Family History  Problem Relation Age of Onset   Diabetes Mother    Hypertension Mother    Pancreatic cancer Mother    Alcohol abuse Father    Arthritis/Rheumatoid Sister    Heart failure Sister    Diabetes Sister    Kidney disease Sister    Diabetes Brother    Breast cancer Neg Hx    Social History   Socioeconomic History   Marital status: Single    Spouse name: Not on file   Number of children: 3   Years of education: Not on file   Highest education level: High school graduate  Occupational History   Not on file  Tobacco Use   Smoking status: Former    Current packs/day: 0.00    Average packs/day: 1 pack/day for 46.0 years (46.0 ttl pk-yrs)    Types: Cigarettes    Start date: 01/07/1985    Quit date: 10/01/2022    Years since quitting: 0.8   Smokeless tobacco: Never  Vaping Use   Vaping status: Never Used  Substance and Sexual Activity   Alcohol use: No    Alcohol/week: 0.0 standard drinks of alcohol   Drug use: No   Sexual activity: Not Currently  Other Topics Concern   Not on file  Social History Narrative   Disabled from severe  atopic dermatitis   Has 3 children   Lives alone but very connect with family ( sees sister and her children daily)   Also belongs to a church   Social Drivers of Corporate investment banker Strain: Low Risk  (07/24/2023)   Overall Financial Resource Strain (CARDIA)    Difficulty of Paying Living Expenses: Not hard at all  Food Insecurity: No Food Insecurity (07/24/2023)   Hunger Vital Sign    Worried About Running Out of Food in the Last Year: Never true    Ran Out of Food in the Last Year: Never true  Transportation Needs: No Transportation Needs (07/24/2023)   PRAPARE - Transportation  Lack of Transportation (Medical): No    Lack of Transportation (Non-Medical): No  Physical Activity: Sufficiently Active (07/24/2023)   Exercise Vital Sign    Days of Exercise per Week: 6 days    Minutes of Exercise per Session: 30 min  Stress: No Stress Concern Present (07/24/2023)   [employer] of Occupational Health - Occupational Stress Questionnaire    Feeling of Stress : Not at all  Social Connections: Moderately Isolated (07/24/2023)   Social Connection and Isolation Panel [NHANES]    Frequency of Communication with Friends and Family: More than three times a week    Frequency of Social Gatherings with Friends and Family: Three times a week    Attends Religious Services: More than 4 times per year    Active Member of Clubs or Organizations: No    Attends Banker Meetings: Never    Marital Status: Never married    Tobacco Counseling Counseling given: Not Answered   Clinical Intake:  Pre-visit preparation completed: Yes  Pain : 0-10 Pain Score: 4  Pain Type: Chronic pain Pain Location: Leg Pain Orientation: Right Pain Descriptors / Indicators: Numbness, Tingling Pain Onset: More than a month ago Pain Frequency: Constant     BMI - recorded: 30.1 Nutritional Status: BMI > 30  Obese Nutritional Risks: None Diabetes: Yes CBG done?: No Did pt. bring in CBG  monitor from home?: No  How often do you need to have someone help you when you read instructions, pamphlets, or other written materials from your doctor or pharmacy?: 1 - Never  Interpreter Needed?: No  Information entered by :: Kennedy Bucker, LPN   Activities of Daily Living    07/24/2023    9:44 AM 10/02/2022    4:14 PM  In your present state of health, do you have any difficulty performing the following activities:  Hearing? 0   Vision? 0   Difficulty concentrating or making decisions? 0   Walking or climbing stairs? 1   Comment NUMB LEG   Dressing or bathing? 0   Doing errands, shopping? 1 0  Preparing Food and eating ? N   Using the Toilet? N   In the past six months, have you accidently leaked urine? N   Do you have problems with loss of bowel control? N   Managing your Medications? N   Managing your Finances? N   Housekeeping or managing your Housekeeping? N     Patient Care Team: Alba Cory, MD as PCP - General (Family Medicine) Herschel Senegal, MD as Referring Physician (Dermatology) Lonell Face, MD as Consulting Physician (Neurology)  Indicate any recent Medical Services you may have received from other than [facility] providers in the past year (date may be approximate).     Assessment:   This is a routine wellness examination for Joliyah.  Hearing/Vision screen Hearing Screening - Comments:: NO AIDS Vision Screening - Comments:: [facility]- DR.BRENNAN   Goals Addressed             This Visit's Progress    DIET - EAT MORE FRUITS AND VEGETABLES         Depression Screen    07/24/2023    9:42 AM 07/02/2023    1:01 PM 09/18/2022    3:23 PM 07/20/2022    9:21 AM 07/12/2022    4:22 PM 06/01/2022    9:15 AM 05/25/2022   10:24 AM  PHQ 2/9 Scores  PHQ - 2 Score 0 0 0 0 0 0 0  PHQ- 9 Score 0 0 0 0  0 0    Fall Risk    07/24/2023    9:43 AM 07/02/2023    1:01 PM 09/18/2022    3:23 PM 07/20/2022    9:18 AM 07/12/2022    4:04 PM  Fall Risk   Falls  in the past year? 0 0 0 0 0  Number falls in past yr: 1 0 0 0 0  Injury with Fall? 0 0 0 0 0  Risk for fall due to : History of fall(s);Impaired mobility;Impaired balance/gait No Fall Risks No Fall Risks No Fall Risks Medication side effect  Follow up Falls prevention discussed;Falls evaluation completed Falls prevention discussed;Education provided;Falls evaluation completed Falls prevention discussed Education provided;Falls prevention discussed Falls prevention discussed    MEDICARE RISK AT HOME: Medicare Risk at Home Any stairs in or around the home?: Yes If so, are there any without handrails?: No Home free of loose throw rugs in walkways, pet beds, electrical cords, etc?: Yes Adequate lighting in your home to reduce risk of falls?: Yes Life alert?: No Use of a cane, walker or w/c?: Yes (CANE ALL THE TIME) Grab bars in the bathroom?: Yes Shower chair or bench in shower?: Yes Elevated toilet seat or a handicapped toilet?: Yes  TIMED UP AND GO:  Was the test performed?  No    Cognitive Function:        07/24/2023    9:46 AM 07/20/2022    9:33 AM 06/12/2019    9:01 AM 05/23/2018    9:24 AM  6CIT Screen  What Year? 0 points 0 points 0 points 0 points  What month? 0 points 0 points 0 points 0 points  What time? 0 points 0 points 0 points 0 points  Count back from 20 0 points 0 points 0 points 0 points  Months in reverse 4 points 4 points 0 points 0 points  Repeat phrase 0 points 0 points 6 points 4 points  Total Score 4 points 4 points 6 points 4 points    Immunizations Immunization History  Administered Date(s) Administered   Fluad Quad(high Dose 65+) 03/02/2020, 05/25/2022   Influenza Split 04/09/2012   Influenza, High Dose Seasonal PF 02/22/2021   Influenza, Seasonal, Injecte, Preservative Fre 02/17/2011   Influenza,inj,Quad PF,6+ Mos 07/16/2013, 03/23/2014, 04/01/2015, 03/14/2016, 03/16/2017, 02/19/2018, 05/07/2019   Influenza-Unspecified 07/16/2013, 03/23/2014,  04/01/2015, 03/14/2016, 03/16/2017, 02/19/2018, 05/07/2019   Pneumococcal Conjugate-13 04/01/2015   Pneumococcal Polysaccharide-23 01/26/2010, 09/05/2019   Tdap 01/25/2012, 05/25/2022   Zoster, Live 01/08/2015    TDAP status: Up to date  Flu Vaccine status: Declined, Education has been provided regarding the importance of this vaccine but patient still declined. Advised may receive this vaccine at local pharmacy or Health Dept. Aware to provide a copy of the vaccination record if obtained from local pharmacy or Health Dept. Verbalized acceptance and understanding.  Pneumococcal vaccine status: Up to date  Covid-19 vaccine status: Declined, Education has been provided regarding the importance of this vaccine but patient still declined. Advised may receive this vaccine at local pharmacy or Health Dept.or vaccine clinic. Aware to provide a copy of the vaccination record if obtained from local pharmacy or Health Dept. Verbalized acceptance and understanding.  Qualifies for Shingles Vaccine? Yes   Zostavax completed Yes   Shingrix Completed?: No.    Education has been provided regarding the importance of this vaccine. Patient has been advised to call insurance company to determine out of pocket expense if they have not  yet received this vaccine. Advised may also receive vaccine at local pharmacy or Health Dept. Verbalized acceptance and understanding.  Screening Tests Health Maintenance  Topic Date Due   COVID-19 Vaccine (1) Never done   Colonoscopy  07/05/2020   MAMMOGRAM  09/16/2021   FOOT EXAM  06/29/2022   Diabetic kidney evaluation - Urine ACR  09/28/2022   Cervical Cancer Screening (Pap smear)  05/26/2023   INFLUENZA VACCINE  09/03/2023 (Originally 01/04/2023)   Zoster Vaccines- Shingrix (1 of 2) 09/30/2023 (Originally 08/06/1974)   Diabetic kidney evaluation - eGFR measurement  09/19/2023   OPHTHALMOLOGY EXAM  11/07/2023   HEMOGLOBIN A1C  12/30/2023   Lung Cancer Screening  06/13/2024    Medicare Annual Wellness (AWV)  07/23/2024   DTaP/Tdap/Td (3 - Td or Tdap) 05/25/2032   Pneumonia Vaccine 77+ Years old  Completed   DEXA SCAN  Completed   Hepatitis C Screening  Completed   HPV VACCINES  Aged Out    Health Maintenance  Health Maintenance Due  Topic Date Due   COVID-19 Vaccine (1) Never done   Colonoscopy  07/05/2020   MAMMOGRAM  09/16/2021   FOOT EXAM  06/29/2022   Diabetic kidney evaluation - Urine ACR  09/28/2022   Cervical Cancer Screening (Pap smear)  05/26/2023    DECLINED REFERRAL FOR COLONOSCOPY  Mammogram status: Ordered 07/24/23. Pt provided with contact info and advised to call to schedule appt.   Bone Density status: Ordered 07/24/23. Pt provided with contact info and advised to call to schedule appt.  Lung Cancer Screening: (Low Dose CT Chest recommended if Age 62-80 years, 20 pack-year currently smoking OR have quit w/in 15years.) does qualify.   Lung Cancer Screening Referral: CT DONE 06/14/23  Additional Screening:  Hepatitis C Screening: does qualify; Completed 01/27/13  Vision Screening: Recommended annual ophthalmology exams for early detection of glaucoma and other disorders of the eye. Is the patient up to date with their annual eye exam?  Yes  Who is the provider or what is the name of the office in which the patient attends annual eye exams? DR.BRENNAN If pt is not established with a provider, would they like to be referred to a provider to establish care? No .   Dental Screening: Recommended annual dental exams for proper oral hygiene  Diabetic Foot Exam: Diabetic Foot Exam: Completed 06/29/21  Community Resource Referral / Chronic Care Management: CRR required this visit?  No   CCM required this visit?  No     Plan:     I have personally reviewed and noted the following in the patient's chart:   Medical and social history Use of alcohol, tobacco or illicit drugs  Current medications and supplements including opioid  prescriptions. Patient is not currently taking opioid prescriptions. Functional ability and status Nutritional status Physical activity Advanced directives List of other physicians Hospitalizations, surgeries, and ER visits in previous 12 months Vitals Screenings to include cognitive, depression, and falls Referrals and appointments  In addition, I have reviewed and discussed with patient certain preventive protocols, quality metrics, and best practice recommendations. A written personalized care plan for preventive services as well as general preventive health recommendations were provided to patient.     Hal Hope, LPN   2/[birth year]/62   After Visit Summary: (MyChart) Due to this being a telephonic visit, the after visit summary with patients personalized plan was offered to patient via MyChart   Nurse Notes: REFERRAL SENT FOR BDS AND MAMMOGRAM

## 2023-08-13 ENCOUNTER — Encounter: Payer: Self-pay | Admitting: Family Medicine

## 2023-08-13 ENCOUNTER — Ambulatory Visit (INDEPENDENT_AMBULATORY_CARE_PROVIDER_SITE_OTHER): Payer: 59 | Admitting: Family Medicine

## 2023-08-13 VITALS — BP 126/74 | HR 96 | Resp 16 | Ht 59.0 in | Wt 152.3 lb

## 2023-08-13 DIAGNOSIS — R29818 Other symptoms and signs involving the nervous system: Secondary | ICD-10-CM | POA: Diagnosis not present

## 2023-08-13 DIAGNOSIS — M5135 Other intervertebral disc degeneration, thoracolumbar region: Secondary | ICD-10-CM

## 2023-08-13 DIAGNOSIS — Z01818 Encounter for other preprocedural examination: Secondary | ICD-10-CM

## 2023-08-13 DIAGNOSIS — E1142 Type 2 diabetes mellitus with diabetic polyneuropathy: Secondary | ICD-10-CM

## 2023-08-13 NOTE — Progress Notes (Signed)
 Name: Julie Wall   MRN: [id number]    DOB: 1956-05-23   Date:08/13/2023       Progress Note  Subjective  Chief Complaint  Chief Complaint  Patient presents with   Pre-op Exam   HPI   Pre-op requested from Dr. Myer Haff for L4-5 trans foraminal interbody fusion, right L5-S1 for lateral foraminotomy, L3-4 decompression under general anesthesia scheduled for Feb 12 th 2025, to control pain on low back pain with right lower back radiculitis. She has a history of thoracic spinal fusion without complications from anesthesia. She continues to have numbness on right leg, using a cane since the neurogenic claudication affects her gait.  She was seen 07/02/2023 and we had top postpone surgery due to A1C being high over 14 %, she has been taking diabetes medications daily since, avoiding sweets, and glucose at home has been controlled - below 141 but mostly in the 130 's fasting. She denies polyphagia, polydipsia or polyuria. We checked fructosamine on Friday but results in pending. We will clear her for surgery if fructosamine is near goal.   Patient Active Problem List   Diagnosis Date Noted   Thoracic spondylosis with myelopathy 10/02/2022   Spinal stenosis of thoracic region 10/02/2022   S/P spinal fusion 10/02/2022   B12 deficiency 09/27/2021   Hypertension associated with diabetes (HCC) 09/27/2021   Diabetic polyneuropathy associated with type 2 diabetes mellitus (HCC)    Hilar lymphadenopathy 02/05/2020   History of COVID-19 02/05/2020   Atherosclerosis of aorta (HCC) 05/04/2017   Pap smear abnormality of cervix with ASCUS favoring benign 04/23/2017   Vitamin D deficiency 12/14/2016   Obesity (BMI 30.0-34.9) 12/13/2015   Chronic bronchitis (HCC) 05/14/2015   Positive H. pylori test 04/05/2015   Rapid urease test for Helicobacter pylori infection positive 04/05/2015   Umbilical hernia 04/01/2015   Exomphalos 04/01/2015   RLS (restless legs syndrome) 01/08/2015   Type 2 diabetes mellitus  with hyperlipidemia (HCC) 01/08/2015   Neurogenic claudication 01/08/2015   AD (atopic dermatitis) 12/28/2014   Chronic low back pain 12/28/2014   Dyslipidemia 12/28/2014   Gastroesophageal reflux disease without esophagitis 12/28/2014   Microalbuminuria 12/28/2014   Disorder of bone and cartilage 12/28/2014   Neuralgia of left thigh 12/28/2014   Perennial allergic rhinitis with seasonal variation 12/28/2014   Tobacco abuse 12/28/2014   Mononeuropathy of left lower extremity 12/28/2014   Hypertension, benign 09/22/2005    Past Surgical History:  Procedure Laterality Date   APPLICATION OF INTRAOPERATIVE CT SCAN N/A 10/02/2022   Procedure: APPLICATION OF INTRAOPERATIVE CT SCAN;  Surgeon: Venetia Night, MD;  Location: [facility] ORS;  Service: Neurosurgery;  Laterality: N/A;   CATARACT EXTRACTION Bilateral 06/05/1978   CESAREAN SECTION     x 2   CORNEAL TRANSPLANT Left    [facility]-Treatment for blindness.    Family History  Problem Relation Age of Onset   Diabetes Mother    Hypertension Mother    Pancreatic cancer Mother    Alcohol abuse Father    Arthritis/Rheumatoid Sister    Heart failure Sister    Diabetes Sister    Kidney disease Sister    Diabetes Brother    Breast cancer Neg Hx     Social History   Tobacco Use   Smoking status: Former    Current packs/day: 0.00    Average packs/day: 1 pack/day for 46.0 years (46.0 ttl pk-yrs)    Types: Cigarettes    Start date: 01/07/1985    Quit date: 10/01/2022  Years since quitting: 0.8   Smokeless tobacco: Never  Substance Use Topics   Alcohol use: No    Alcohol/week: 0.0 standard drinks of alcohol     Current Outpatient Medications:    ACCU-CHEK AVIVA PLUS test strip, CHECK BLOOD SUGAR ONCE DAILY, Disp: 100 each, Rfl: 2   albuterol (VENTOLIN HFA) 108 (90 Base) MCG/ACT inhaler, Inhale 2 puffs into the lungs every 6 (six) hours as needed for wheezing or shortness of breath., Disp: 18 g, Rfl: 0   AquaLance Lancets 30G MISC,  CHECK BLOOD SUGAR 2 TIMES A DAY, Disp: 200 each, Rfl: 3   aspirin EC 81 MG tablet, Take 81 mg by mouth daily., Disp: , Rfl:    atorvastatin (LIPITOR) 40 MG tablet, Take 1 tablet (40 mg total) by mouth daily., Disp: 90 tablet, Rfl: 1   Blood Glucose Monitoring Suppl DEVI, 1 each by Does not apply route in the morning, at noon, and at bedtime. May substitute to any manufacturer covered by patient's insurance., Disp: 1 each, Rfl: 0   Cholecalciferol 50 MCG (2000 UT) CAPS, Take 2,000 Units by mouth daily., Disp: , Rfl:    Cyanocobalamin (B-12) 1000 MCG SUBL, Place 1 tablet under the tongue daily., Disp: 100 tablet, Rfl: 1   empagliflozin (JARDIANCE) 25 MG TABS tablet, Take 1 tablet (25 mg total) by mouth daily., Disp: 90 tablet, Rfl: 1   Fluticasone-Umeclidin-Vilant (TRELEGY ELLIPTA) 200-62.5-25 MCG/ACT AEPB, Inhale 1 puff into the lungs daily., Disp: 28 each, Rfl: 11   folic acid (FOLVITE) 1 MG tablet, Take 1 mg by mouth See admin instructions. Take 1 mg by mouth daily except on Fridays, Disp: , Rfl:    gabapentin (NEURONTIN) 300 MG capsule, Take 1 capsule (300 mg total) by mouth 3 (three) times daily., Disp: 90 capsule, Rfl: 0   metFORMIN (GLUCOPHAGE-XR) 750 MG 24 hr tablet, TAKE 2 TABLETS (1,500 MG TOTAL) BY MOUTHDAILY WITH BREAKFAST, Disp: 180 tablet, Rfl: 0   methocarbamol (ROBAXIN) 500 MG tablet, Take 1 tablet (500 mg total) by mouth every 8 (eight) hours as needed for muscle spasms., Disp: 90 tablet, Rfl: 0   methotrexate (RHEUMATREX) 2.5 MG tablet, Take 17.5 mg by mouth every Friday., Disp: , Rfl:    senna (SENOKOT) 8.6 MG TABS tablet, Take 1 tablet (8.6 mg total) by mouth 2 (two) times daily., Disp: 120 tablet, Rfl: 0   triamcinolone ointment (KENALOG) 0.1 %, Apply topically 2 (two) times daily. (Patient taking differently: Apply 1 Application topically 2 (two) times daily as needed (itching/irritation).), Disp: 30 g, Rfl: 0  Allergies  Allergen Reactions   Penicillins Rash    I personally  reviewed active problem list, medication list, allergies with the patient/caregiver today.   ROS  Ten systems reviewed and is negative except as mentioned in HPI    Objective  Vitals:   08/13/23 1105  BP: 126/74  Pulse: 96  Resp: 16  SpO2: 95%  Weight: 152 lb 4.8 oz (69.1 kg)  Height: 4\' 11"  (1.499 m)    Body mass index is 30.76 kg/m.  Physical Exam  Constitutional: Patient appears well-developed and well-nourished. Obese  No distress.  HEENT: head atraumatic, normocephalic, pupils equal and reactive to light, neck supple Cardiovascular: Normal rate, regular rhythm and normal heart sounds.  No murmur heard. No BLE edema. Pulmonary/Chest: Effort normal and breath sounds normal. No respiratory distress. Abdominal: Soft.  There is no tenderness. Muscular skeletal: she continues a cane due to numbness right leg. No back pain Psychiatric: Patient  has a normal mood and affect. behavior is normal. Judgment and thought content normal.   Recent Results (from the past 2160 hours)  POCT glycosylated hemoglobin (Hb A1C)     Status: None   Collection Time: 07/02/23  1:29 PM  Result Value Ref Range   Hemoglobin A1C     HbA1c POC (<> result, manual entry) >14.0 4.0 - 5.6 %   HbA1c, POC (prediabetic range)     HbA1c, POC (controlled diabetic range)    Lipid panel     Status: Abnormal   Collection Time: 08/10/23 11:30 AM  Result Value Ref Range   Cholesterol 92 <200 mg/dL   HDL 46 (L) > OR = 50 mg/dL   Triglycerides 57 <161 mg/dL   LDL Cholesterol (Calc) 33 mg/dL (calc)    Comment: Reference range: <100 . Desirable range <100 mg/dL for primary prevention;   <70 mg/dL for patients with CHD or diabetic patients  with > or = 2 CHD risk factors. Marland Kitchen LDL-C is now calculated using the Martin-Hopkins  calculation, which is a validated novel method providing  better accuracy than the Friedewald equation in the  estimation of LDL-C.  Horald Pollen et al. Lenox Ahr. 0960;454(09): 2061-2068   (http://education.[facility].com/faq/FAQ164)    Total CHOL/HDL Ratio 2.0 <5.0 (calc)   Non-HDL Cholesterol (Calc) 46 <811 mg/dL (calc)    Comment: For patients with diabetes plus 1 major ASCVD risk  factor, treating to a non-HDL-C goal of <100 mg/dL  (LDL-C of <91 mg/dL) is considered a therapeutic  option.   Microalbumin / creatinine urine ratio     Status: None   Collection Time: 08/10/23 11:30 AM  Result Value Ref Range   Creatinine, Urine 26 20 - 275 mg/dL   Microalb, Ur 0.4 mg/dL    Comment: Reference Range Not established    Microalb Creat Ratio 15 <30 mg/g creat    Comment: . The ADA defines abnormalities in albumin excretion as follows: Marland Kitchen Albuminuria Category        Result (mg/g creatinine) . Normal to Mildly increased   <30 Moderately increased         30-299  Severely increased           > OR = 300 . The ADA recommends that at least two of three specimens collected within a 3-6 month period be abnormal before considering a patient to be within a diagnostic category.   COMPLETE METABOLIC PANEL WITH GFR     Status: None   Collection Time: 08/10/23 11:30 AM  Result Value Ref Range   Glucose, Bld 85 65 - 99 mg/dL    Comment: .            Fasting reference interval .    BUN 15 7 - 25 mg/dL   Creat 4.78 2.95 - 6.21 mg/dL   eGFR 308 > OR = 60 MV/HQI/6.96E9   BUN/Creatinine Ratio SEE NOTE: 6 - 22 (calc)    Comment:    Not Reported: BUN and Creatinine are within    reference range. .    Sodium 140 135 - 146 mmol/L   Potassium 4.2 3.5 - 5.3 mmol/L   Chloride 105 98 - 110 mmol/L   CO2 27 20 - 32 mmol/L   Calcium 10.3 8.6 - 10.4 mg/dL   Total Protein 7.8 6.1 - 8.1 g/dL   Albumin 4.5 3.6 - 5.1 g/dL   Globulin 3.3 1.9 - 3.7 g/dL (calc)   AG Ratio 1.4 1.0 - 2.5 (calc)  Total Bilirubin 0.4 0.2 - 1.2 mg/dL   Alkaline phosphatase (APISO) 97 37 - 153 U/L   AST 14 10 - 35 U/L   ALT 14 6 - 29 U/L  CBC with Differential/Platelet     Status: Abnormal    Collection Time: 08/10/23 11:30 AM  Result Value Ref Range   WBC 8.1 3.8 - 10.8 Thousand/uL   RBC 5.02 3.80 - 5.10 Million/uL   Hemoglobin 15.9 (H) 11.7 - 15.5 g/dL   HCT 91.4 (H) 78.2 - 95.6 %   MCV 93.0 80.0 - 100.0 fL   MCH 31.7 27.0 - 33.0 pg   MCHC 34.0 32.0 - 36.0 g/dL    Comment: For adults, a slight decrease in the calculated MCHC value (in the range of 30 to 32 g/dL) is most likely not clinically significant; however, it should be interpreted with caution in correlation with other red cell parameters and the patient's clinical condition.    RDW 12.3 11.0 - 15.0 %   Platelets 291 140 - 400 Thousand/uL   MPV 11.8 7.5 - 12.5 fL   Neutro Abs 4,747 1,500 - 7,800 cells/uL   Absolute Lymphocytes 2,730 850 - 3,900 cells/uL   Absolute Monocytes 348 200 - 950 cells/uL   Eosinophils Absolute 235 15 - 500 cells/uL   Basophils Absolute 41 0 - 200 cells/uL   Neutrophils Relative % 58.6 %   Total Lymphocyte 33.7 %   Monocytes Relative 4.3 %   Eosinophils Relative 2.9 %   Basophils Relative 0.5 %  B12 and Folate Panel     Status: None   Collection Time: 08/10/23 11:30 AM  Result Value Ref Range   Vitamin B-12 517 200 - 1,100 pg/mL   Folate 10.2 ng/mL    Comment:                            Reference Range                            Low:           <3.4                            Borderline:    3.4-5.4                            Normal:        >5.4 .     Diabetic Foot Exam:     PHQ2/9:    07/24/2023    9:42 AM 07/02/2023    1:01 PM 09/18/2022    3:23 PM 07/20/2022    9:21 AM 07/12/2022    4:22 PM  Depression screen PHQ 2/9  Decreased Interest 0 0 0 0 0  Down, Depressed, Hopeless 0 0 0 0 0  PHQ - 2 Score 0 0 0 0 0  Altered sleeping 0 0 0 0   Tired, decreased energy 0 0 0 0   Change in appetite 0 0 0 0   Feeling bad or failure about yourself  0 0 0 0   Trouble concentrating 0 0 0 0   Moving slowly or fidgety/restless 0 0 0 0   Suicidal thoughts 0 0 0 0   PHQ-9 Score 0 0  0 0   Difficult doing work/chores Not difficult at all Not  difficult at all  Not difficult at all     phq 9 is negative  Fall Risk:    07/24/2023    9:43 AM 07/02/2023    1:01 PM 09/18/2022    3:23 PM 07/20/2022    9:18 AM 07/12/2022    4:04 PM  Fall Risk   Falls in the past year? 0 0 0 0 0  Number falls in past yr: 1 0 0 0 0  Injury with Fall? 0 0 0 0 0  Risk for fall due to : History of fall(s);Impaired mobility;Impaired balance/gait No Fall Risks No Fall Risks No Fall Risks Medication side effect  Follow up Falls prevention discussed;Falls evaluation completed Falls prevention discussed;Education provided;Falls evaluation completed Falls prevention discussed Education provided;Falls prevention discussed Falls prevention discussed     Assessment & Plan  1. Neurogenic claudication (Primary)  Stable  2. DDD (degenerative disc disease), thoracolumbar  Getting ready to have surgery   3. DM type 2 with diabetic peripheral neuropathy (HCC)  Waiting for fructosamine results and we will send a note to neurosurgeon with recommendations once result is back  4. Pre-op evaluation   We will decide if okay to proceed if fructosamine shows improvement of glucose levels

## 2023-08-15 ENCOUNTER — Telehealth: Payer: Self-pay

## 2023-08-15 LAB — COMPLETE METABOLIC PANEL WITH GFR
AG Ratio: 1.4 (calc) (ref 1.0–2.5)
ALT: 14 U/L (ref 6–29)
AST: 14 U/L (ref 10–35)
Albumin: 4.5 g/dL (ref 3.6–5.1)
Alkaline phosphatase (APISO): 97 U/L (ref 37–153)
BUN: 15 mg/dL (ref 7–25)
CO2: 27 mmol/L (ref 20–32)
Calcium: 10.3 mg/dL (ref 8.6–10.4)
Chloride: 105 mmol/L (ref 98–110)
Creat: 0.53 mg/dL (ref 0.50–1.05)
Globulin: 3.3 g/dL (ref 1.9–3.7)
Glucose, Bld: 85 mg/dL (ref 65–99)
Potassium: 4.2 mmol/L (ref 3.5–5.3)
Sodium: 140 mmol/L (ref 135–146)
Total Bilirubin: 0.4 mg/dL (ref 0.2–1.2)
Total Protein: 7.8 g/dL (ref 6.1–8.1)
eGFR: 100 mL/min/{1.73_m2} (ref >=60)

## 2023-08-15 LAB — CBC WITH DIFFERENTIAL/PLATELET
Absolute Lymphocytes: 2730 {cells}/uL (ref 850–3900)
Absolute Monocytes: 348 {cells}/uL (ref 200–950)
Basophils Absolute: 41 {cells}/uL (ref 0–200)
Basophils Relative: 0.5 %
Eosinophils Absolute: 235 {cells}/uL (ref 15–500)
Eosinophils Relative: 2.9 %
HCT: 46.7 % — ABNORMAL HIGH (ref 35.0–45.0)
Hemoglobin: 15.9 g/dL — ABNORMAL HIGH (ref 11.7–15.5)
MCH: 31.7 pg (ref 27.0–33.0)
MCHC: 34 g/dL (ref 32.0–36.0)
MCV: 93 fL (ref 80.0–100.0)
MPV: 11.8 fL (ref 7.5–12.5)
Monocytes Relative: 4.3 %
Neutro Abs: 4747 {cells}/uL (ref 1500–7800)
Neutrophils Relative %: 58.6 %
Platelets: 291 10*3/uL (ref 140–400)
RBC: 5.02 10*6/uL (ref 3.80–5.10)
RDW: 12.3 % (ref 11.0–15.0)
Total Lymphocyte: 33.7 %
WBC: 8.1 10*3/uL (ref 3.8–10.8)

## 2023-08-15 LAB — MICROALBUMIN / CREATININE URINE RATIO
Creatinine, Urine: 26 mg/dL (ref 20–275)
Microalb Creat Ratio: 15 mg/g{creat} (ref <30)
Microalb, Ur: 0.4 mg/dL

## 2023-08-15 LAB — B12 AND FOLATE PANEL
Folate: 10.2 ng/mL
Vitamin B-12: 517 pg/mL (ref 200–1100)

## 2023-08-15 LAB — LIPID PANEL
Cholesterol: 92 mg/dL (ref <200)
HDL: 46 mg/dL — ABNORMAL LOW (ref >=50)
LDL Cholesterol (Calc): 33 mg/dL
Non-HDL Cholesterol (Calc): 46 mg/dL (ref <130)
Total CHOL/HDL Ratio: 2 (calc) (ref <5.0)
Triglycerides: 57 mg/dL (ref <150)

## 2023-08-15 LAB — FRUCTOSAMINE: Fructosamine: 291 umol/L — ABNORMAL HIGH (ref 205–285)

## 2023-08-15 NOTE — Telephone Encounter (Signed)
 Received fax from Dr Carlynn Purl office requesting a new copy of the surgery clearance form. Per secure chat from Dr Carlynn Purl, Julie Wall has been compliant with her diet, has started taking her diabetes medications again, and her fructosamine test is acceptable to proceed with surgery. I have faxed a new clearance form to her office.

## 2023-08-16 NOTE — Telephone Encounter (Signed)
 I called [name] to offer her surgery dates. She requested to come in to see Dr Myer Haff in person again before choosing a surgery date. Appointment has been scheduled for 08/23/23.

## 2023-08-16 NOTE — Telephone Encounter (Signed)
 Clearance has been received from Dr Carlynn Purl.

## 2023-08-23 ENCOUNTER — Encounter: Payer: Self-pay | Admitting: Neurosurgery

## 2023-08-23 ENCOUNTER — Ambulatory Visit (INDEPENDENT_AMBULATORY_CARE_PROVIDER_SITE_OTHER): Admitting: Neurosurgery

## 2023-08-23 VITALS — BP 128/74 | Ht 62.0 in | Wt 147.7 lb

## 2023-08-23 DIAGNOSIS — M5442 Lumbago with sciatica, left side: Secondary | ICD-10-CM | POA: Diagnosis not present

## 2023-08-23 DIAGNOSIS — M5441 Lumbago with sciatica, right side: Secondary | ICD-10-CM

## 2023-08-23 DIAGNOSIS — G8929 Other chronic pain: Secondary | ICD-10-CM | POA: Diagnosis not present

## 2023-08-23 DIAGNOSIS — M5416 Radiculopathy, lumbar region: Secondary | ICD-10-CM | POA: Diagnosis not present

## 2023-08-23 DIAGNOSIS — M4316 Spondylolisthesis, lumbar region: Secondary | ICD-10-CM | POA: Diagnosis not present

## 2023-08-23 DIAGNOSIS — M48062 Spinal stenosis, lumbar region with neurogenic claudication: Secondary | ICD-10-CM | POA: Diagnosis not present

## 2023-08-23 NOTE — Progress Notes (Signed)
   REFERRING PHYSICIAN:  Alba Cory, Md [address] [place],  [place] [address]  DOS: 10/02/22 T11-12 PSFD  HISTORY OF PRESENT ILLNESS:  08/23/2023 Ms. Silas continues with the same symptoms as before.  Unfortunately, she has started back smoking.   06/07/2023 Ms. Strawderman returns to see me.  She has continued pain down her legs.  She has tried physical therapy without improvement.  03/22/2023 Audrena Tafolla is status post thoracic decompression and fusion.  She has done well with her thoracic surgery.  She comes in today with pain down her right leg.  She previously had similar symptoms, but they improved before our prior visit.  She is having some weakness and difficulty walking due to this.   PHYSICAL EXAMINATION:  General: Patient is well developed, well nourished, calm, collected, and in no apparent distress.   NEUROLOGICAL:  General: In no acute distress.   Awake, alert, oriented to person, place, and time.  Strength:            Side Iliopsoas Quads Hamstring PF DF EHL  R 4+ 4+ 5 5 5 5   L 5 5 5 5 5 5    Incision c/d/i with a small area of dehiscence at the inferior most portion of the incision   ROS (Neurologic):  Negative except as noted above  IMAGING: No complications noted  MRI L spine 07/25/2022  IMPRESSION: 1. Probably severe canal stenosis at T11-T12 and at least moderate canal stenosis at T10-T11 with cord compression and cord edema and/or myelomalacia at T11-T12, only imaged sagittally. Findings are likely progressed relative to 2021 prior, but recommend dedicated MRI of the thoracic spine to fully characterize. 2. At L4-L5, severe left subarticular recess stenosis with moderate to severe central canal stenosis and moderate left foraminal stenosis. 3. At L5-S1, moderate to severe right and moderate left foraminal stenosis. Mild canal stenosis. 4. At L3-L4, moderate canal stenosis with mild-to-moderate left foraminal stenosis.      Electronically Signed   By: Feliberto Harts M.D.   On: 07/25/2022 12:29  ASSESSMENT/PLAN:  Nalia Wiemers is doing well after thoracic decompression and fusion.  She has had a return of symptoms from her lower back.  She has spondylolisthesis at L4-5 with severe compression at L4-5.  She additionally has moderate stenosis at L3-4.  She has some element of radiculopathy and neurogenic claudication as well as back pain with sciatica.  She also has severe compression of the right L5 nerve root in the foramen at L5-S1.  She has tried and failed conservative management.  I have recommended surgical intervention.   Unfortunately she has begun smoking again.  I initially recommended L3-4 decompression with right sided L5-S1 far lateral foraminotomy as well as L4-5 transforaminal lumbar interbody fusion.  I think a transforaminal lumbar interbody fusion at L4-5 is indicated due to her spondylolisthesis as well as back pain.  When she quit smoking, we will repeat her MRI scan as it is now approximately 68-year-old.  We will then make surgical plans.  I spent a total of 15 minutes in this patient's care today. This time was spent reviewing pertinent records including imaging studies, obtaining and confirming history, performing a directed evaluation, formulating and discussing my recommendations, and documenting the visit within the medical record.   Venetia Night MD Department of neurosurgery

## 2023-08-30 ENCOUNTER — Encounter: Payer: 59 | Admitting: Neurosurgery

## 2023-09-06 ENCOUNTER — Other Ambulatory Visit: Payer: Self-pay | Admitting: Neurology

## 2023-09-06 DIAGNOSIS — M4807 Spinal stenosis, lumbosacral region: Secondary | ICD-10-CM

## 2023-09-10 ENCOUNTER — Ambulatory Visit
Admission: RE | Admit: 2023-09-10 | Discharge: 2023-09-10 | Disposition: A | Discharge status: Home / Self Care | Source: Ambulatory Visit | Attending: Neurology | Admitting: Neurology

## 2023-09-10 DIAGNOSIS — M4807 Spinal stenosis, lumbosacral region: Secondary | ICD-10-CM | POA: Insufficient documentation

## 2023-10-09 ENCOUNTER — Encounter: Payer: 59 | Admitting: Neurosurgery

## 2023-10-23 ENCOUNTER — Ambulatory Visit (INDEPENDENT_AMBULATORY_CARE_PROVIDER_SITE_OTHER): Admitting: Neurosurgery

## 2023-10-23 ENCOUNTER — Other Ambulatory Visit
Admission: RE | Admit: 2023-10-23 | Discharge: 2023-10-23 | Disposition: A | Discharge status: Home / Self Care | Source: Ambulatory Visit | Attending: Neurosurgery | Admitting: Neurosurgery

## 2023-10-23 ENCOUNTER — Encounter: Payer: Self-pay | Admitting: Neurosurgery

## 2023-10-23 ENCOUNTER — Other Ambulatory Visit: Payer: Self-pay

## 2023-10-23 VITALS — BP 136/76 | Ht 62.0 in | Wt 158.0 lb

## 2023-10-23 DIAGNOSIS — M5416 Radiculopathy, lumbar region: Secondary | ICD-10-CM | POA: Diagnosis not present

## 2023-10-23 DIAGNOSIS — Z87891 Personal history of nicotine dependence: Secondary | ICD-10-CM | POA: Insufficient documentation

## 2023-10-23 DIAGNOSIS — E1165 Type 2 diabetes mellitus with hyperglycemia: Secondary | ICD-10-CM

## 2023-10-23 DIAGNOSIS — M4316 Spondylolisthesis, lumbar region: Secondary | ICD-10-CM

## 2023-10-23 DIAGNOSIS — M48062 Spinal stenosis, lumbar region with neurogenic claudication: Secondary | ICD-10-CM | POA: Diagnosis not present

## 2023-10-23 DIAGNOSIS — M5442 Lumbago with sciatica, left side: Secondary | ICD-10-CM | POA: Diagnosis not present

## 2023-10-23 DIAGNOSIS — Z01818 Encounter for other preprocedural examination: Secondary | ICD-10-CM

## 2023-10-23 DIAGNOSIS — M5441 Lumbago with sciatica, right side: Secondary | ICD-10-CM | POA: Diagnosis not present

## 2023-10-23 DIAGNOSIS — G8929 Other chronic pain: Secondary | ICD-10-CM

## 2023-10-23 LAB — HEMOGLOBIN A1C
Hgb A1c MFr Bld: 6.6 % — ABNORMAL HIGH (ref 4.8–5.6)
Mean Plasma Glucose: 142.72 mg/dL

## 2023-10-23 NOTE — Progress Notes (Signed)
 REFERRING PHYSICIAN:  No referring provider defined for this encounter.  DOS: 10/02/22 T11-12 PSFD  HISTORY OF PRESENT ILLNESS:  10/23/2023 Julie Wall returns with continued symptoms.  She has been monitoring her sugars and has not smoked since March.  Her symptoms are unchanged.  08/23/2023 Julie Wall continues with the same symptoms as before.  Unfortunately, she has started back smoking.   06/07/2023 Julie Wall returns to see me.  She has continued pain down her legs.  She has tried physical therapy without improvement.  03/22/2023 Julie Wall is status post thoracic decompression and fusion.  She has done well with her thoracic surgery.  She comes in today with pain down her right leg.  She previously had similar symptoms, but they improved before our prior visit.  She is having some weakness and difficulty walking due to this.   PHYSICAL EXAMINATION:  General: Patient is well developed, well nourished, calm, collected, and in no apparent distress.   NEUROLOGICAL:  General: In no acute distress.   Awake, alert, oriented to person, place, and time.  Strength:            Side Iliopsoas Quads Hamstring PF DF EHL  R 4+ 4+ 4+ 5 5 5   L 5 5 5 5 5 5    Incision c/d/i with a small area of dehiscence at the inferior most portion of the incision   ROS (Neurologic):  Negative except as noted above  IMAGING: No complications noted  MRI L spine 07/25/2022  IMPRESSION: 1. Probably severe canal stenosis at T11-T12 and at least moderate canal stenosis at T10-T11 with cord compression and cord edema and/or myelomalacia at T11-T12, only imaged sagittally. Findings are likely progressed relative to 2021 prior, but recommend dedicated MRI of the thoracic spine to fully characterize. 2. At L4-L5, severe left subarticular recess stenosis with moderate to severe central canal stenosis and moderate left foraminal stenosis. 3. At L5-S1, moderate to severe right and moderate left  foraminal stenosis. Mild canal stenosis. 4. At L3-L4, moderate canal stenosis with mild-to-moderate left foraminal stenosis.     Electronically Signed   By: Stevenson Elbe M.D.   On: 07/25/2022 12:29  MRI L spine 09/10/2023 IMPRESSION: 1. At L4-5 there is a moderate disc bulge. Severe bilateral facet arthropathy with a left facet effusion. Severe central canal stenosis. Severe left foraminal stenosis. Moderate right foraminal stenosis. 2. At L3-4 there is a moderate disc bulge with a left foraminal annular fissure mild bilateral facet arthropathy. Moderate central canal stenosis. Bilateral lateral recess stenosis. Mild right and moderate left foraminal stenosis. 3. At L5-S1 there is a mild disc bulge with a broad central disc protrusion. Moderate bilateral facet arthropathy. Mild central canal stenosis. Bilateral subarticular recess stenosis. Moderate-severe bilateral foraminal stenosis. 4. At T11-12 there is a mild disc bulge. Moderate-severe right foraminal stenosis. Moderate left foraminal stenosis. 5. No acute osseous injury of the lumbar spine.     Electronically Signed   By: Onnie Bilis M.D.   On: 09/28/2023 11:45  ASSESSMENT/PLAN:  Julie Wall is doing well after thoracic decompression and fusion.  She has had a return of symptoms from her lower back.  She has spondylolisthesis at L4-5 with severe compression at L4-5.  She additionally has moderate stenosis at L3-4.  She has some element of radiculopathy and neurogenic claudication as well as back pain with sciatica.  She also has severe compression of the right L5 nerve root in the foramen at L5-S1.  She has tried and  failed conservative management.  I have recommended surgical intervention.   I have recommended L3-4 decompression with right sided L5-S1 far lateral foraminotomy as well as L4-5 transforaminal lumbar interbody fusion.  I think a transforaminal lumbar interbody fusion at L4-5 is indicated due to her  spondylolisthesis as well as back pain.  I discussed the planned procedure at length with the patient, including the risks, benefits, alternatives, and indications. The risks discussed include but are not limited to bleeding, infection, need for reoperation, spinal fluid leak, stroke, vision loss, anesthetic complication, coma, paralysis, and even death. I also described in detail that improvement was not guaranteed.  The patient expressed understanding of these risks, and asked that we proceed with surgery. I described the surgery in layman's terms, and gave ample opportunity for questions, which were answered to the best of my ability.  I spent a total of 15 minutes in this patient's care today. This time was spent reviewing pertinent records including imaging studies, obtaining and confirming history, performing a directed evaluation, formulating and discussing my recommendations, and documenting the visit within the medical record.   Jodeen Munch MD Department of neurosurgery

## 2023-10-23 NOTE — Patient Instructions (Addendum)
 Please see below for information in regards to your upcoming surgery:  Please go to the [facility] today for A1c and Nicotine test   Planned surgery: L4-5 transforaminal lumbar interbody fusion, Right L5-S1 far lateral foraminotomy, L3-4 decompression    Surgery date: 11/16/23 at [facility] (Medical Mall: [address] [place], [place] [address]) - you will find out your arrival time the business day before your surgery.   Pre-op appointment at [facility] Pre-admit Testing: you will receive a call with a date/time for this appointment. If you are scheduled for an in person appointment, Pre-admit Testing is located on the first floor of the [facility] building, [address], [address]. During this appointment, they will advise you which medications you can take the morning of surgery, and which medications you will need to hold for surgery. Labs (such as blood work, EKG) may be done at your pre-op appointment. You are not required to fast for these labs. Should you need to change your pre-op appointment, please call Pre-admit testing at [phone].      Blood thinners:   Aspirin :   stop aspirin  7 days prior, resume aspirin  14 days after     Diabetes/weight loss medications: Per anesthesia guidelines (due to the increased risk of aspiration caused by delayed gastric emptying):  Empagliflozin  (Jardiance ): hold 3 days prior to surgery Metformin : hold for 2 days prior to surgery    Surgical clearance: we have received clearance from Dr Ava Lei     NSAIDS (Non-steroidal anti-inflammatory drugs): because you are having a fusion, please avoid taking any NSAIDS (examples: ibuprofen, motrin, aleve, naproxen, meloxicam , diclofenac) for 3 months after surgery. Celebrex is an exception and is OK to take, if prescribed. Tylenol  is not an NSAID.    Common restrictions after surgery: No bending, lifting, or twisting ("BLT"). Avoid lifting  objects heavier than 10 pounds for the first 6 weeks after surgery. Where possible, avoid household activities that involve lifting, bending, reaching, pushing, or pulling such as laundry, vacuuming, grocery shopping, and childcare. Try to arrange for help from friends and family for these activities while you heal. Do not drive while taking prescription pain medication. Weeks 6 through 12 after surgery: avoid lifting more than 25 pounds.    X-rays after surgery: Because you are having a fusion: for appointments after your 2 week follow-up: please arrive at the [facility] outpatient imaging center ([address], [address], [place]) or [facility] one hour prior to your appointment for x-rays. This applies to every appointment after your 2 week follow-up. Failure to do so may result in your appointment being rescheduled.   How to contact us :  If you have any questions/concerns before or after surgery, you can reach us  at [phone], or you can send a mychart message. We can be reached by phone or mychart 8am-4pm, Monday-Friday.  *Please note: Calls after 4pm are forwarded to a third party answering service. Mychart messages are not routinely monitored during evenings, weekends, and holidays. Please call our office to contact the answering service for urgent concerns during non-business hours.    If you have FMLA/disability paperwork, please drop it off or fax it to [phone], attention Patty.   Appointments/FMLA & disability paperwork: Gerlean Kocher, & Maryann Smalls Registered Nurses/Surgery schedulers: Nicolas Sisler & Lauren Medical Assistants: Donnajean Fuse Physician Assistants: Ludwig Safer, PA-C, Anastacio Karvonen, PA-C & Lucetta Russel, PA-C Surgeons: Jodeen Munch, MD & Henderson Lock, MD   [facility] PREADMIT TESTING VISIT and SURGERY INFORMATION SHEET   Now that surgery has been scheduled you can anticipate several phone calls from [facility]  services. A pharmacy technician will call you to verify your current list of medications taken at home.               The Pre-Service Center will call to verify your insurance information and to give you billing estimates and information.             The Preadmit Testing Office will be calling to schedule a visit to obtain information for the anesthesia team and provide instructions on preparation for surgery.  What can you expect for the Preadmit Testing Visit: Appointments may be scheduled in-person or by telephone.  If a telephone visit is scheduled, you may be asked to come into the office to have lab tests or other studies performed.   This visit will not be completed any greater than 14 days prior to your surgery.  If your surgery has been scheduled for a future date, please do not be alarmed if we have not contacted you to schedule an appointment more than a month prior to the surgery date.    Please be prepared to provide the following information during this appointment:            -Personal medical history                                               -Medication and allergy list            -Any history of problems with anesthesia              -Recent lab work or diagnostic studies            -Please notify us  of any needs we should be aware of to provide the best care possible           -You will be provided with instructions on how to prepare for your surgery.    On The Day of Surgery:  You must have a driver to take you home after surgery, you will be asked not to drive for 24 hours following surgery.  Taxi, [employer] and non-medical transport will not be acceptable means of transportation unless you have a responsible individual who will be traveling with you.  Visitors in the surgical area:   2 people will be able to visit you in your room once your preparation for surgery has been completed. During surgery, your visitors will be asked to wait in the Surgery Waiting Area.  It is not a  requirement for them to stay, if they prefer to leave and come back.  Your visitor(s) will be given an update once the surgery has been completed.  No visitors are allowed in the initial recovery room to respect patient privacy and safety.  Once you are more awake and transfer to the secondary recovery area, or are transferred to an inpatient room, visitors will again be able to see you.  To respect and protect your privacy: We will ask on the day of surgery who your driver will be and what the contact number for that individual will be. We will ask if it is okay to share information with this individual, or if there is an alternative individual that we, or  the surgeon, should contact to provide updates and information. If family or friends come to the surgical information desk requesting information about you, who you have not listed with us , no information will be given.   It may be helpful to designate someone as the main contact who will be responsible for updating your other friends and family.    PREADMIT TESTING OFFICE: [phone] SAME DAY SURGERY: [phone] We look forward to caring for you before and throughout the process of your surgery.

## 2023-10-23 NOTE — H&P (View-Only) (Signed)
 REFERRING PHYSICIAN:  No referring provider defined for this encounter.  DOS: 10/02/22 T11-12 PSFD  HISTORY OF PRESENT ILLNESS:  10/23/2023 Julie Wall returns with continued symptoms.  She has been monitoring her sugars and has not smoked since March.  Her symptoms are unchanged.  08/23/2023 Julie Wall continues with the same symptoms as before.  Unfortunately, she has started back smoking.   06/07/2023 Julie Wall returns to see me.  She has continued pain down her legs.  She has tried physical therapy without improvement.  03/22/2023 Julie Wall is status post thoracic decompression and fusion.  She has done well with her thoracic surgery.  She comes in today with pain down her right leg.  She previously had similar symptoms, but they improved before our prior visit.  She is having some weakness and difficulty walking due to this.   PHYSICAL EXAMINATION:  General: Patient is well developed, well nourished, calm, collected, and in no apparent distress.   NEUROLOGICAL:  General: In no acute distress.   Awake, alert, oriented to person, place, and time.  Strength:            Side Iliopsoas Quads Hamstring PF DF EHL  R 4+ 4+ 4+ 5 5 5   L 5 5 5 5 5 5    Incision c/d/i with a small area of dehiscence at the inferior most portion of the incision   ROS (Neurologic):  Negative except as noted above  IMAGING: No complications noted  MRI L spine 07/25/2022  IMPRESSION: 1. Probably severe canal stenosis at T11-T12 and at least moderate canal stenosis at T10-T11 with cord compression and cord edema and/or myelomalacia at T11-T12, only imaged sagittally. Findings are likely progressed relative to 2021 prior, but recommend dedicated MRI of the thoracic spine to fully characterize. 2. At L4-L5, severe left subarticular recess stenosis with moderate to severe central canal stenosis and moderate left foraminal stenosis. 3. At L5-S1, moderate to severe right and moderate left  foraminal stenosis. Mild canal stenosis. 4. At L3-L4, moderate canal stenosis with mild-to-moderate left foraminal stenosis.     Electronically Signed   By: Stevenson Elbe M.D.   On: 07/25/2022 12:29  MRI L spine 09/10/2023 IMPRESSION: 1. At L4-5 there is a moderate disc bulge. Severe bilateral facet arthropathy with a left facet effusion. Severe central canal stenosis. Severe left foraminal stenosis. Moderate right foraminal stenosis. 2. At L3-4 there is a moderate disc bulge with a left foraminal annular fissure mild bilateral facet arthropathy. Moderate central canal stenosis. Bilateral lateral recess stenosis. Mild right and moderate left foraminal stenosis. 3. At L5-S1 there is a mild disc bulge with a broad central disc protrusion. Moderate bilateral facet arthropathy. Mild central canal stenosis. Bilateral subarticular recess stenosis. Moderate-severe bilateral foraminal stenosis. 4. At T11-12 there is a mild disc bulge. Moderate-severe right foraminal stenosis. Moderate left foraminal stenosis. 5. No acute osseous injury of the lumbar spine.     Electronically Signed   By: Onnie Bilis M.D.   On: 09/28/2023 11:45  ASSESSMENT/PLAN:  Julie Wall is doing well after thoracic decompression and fusion.  She has had a return of symptoms from her lower back.  She has spondylolisthesis at L4-5 with severe compression at L4-5.  She additionally has moderate stenosis at L3-4.  She has some element of radiculopathy and neurogenic claudication as well as back pain with sciatica.  She also has severe compression of the right L5 nerve root in the foramen at L5-S1.  She has tried and  failed conservative management.  I have recommended surgical intervention.   I have recommended L3-4 decompression with right sided L5-S1 far lateral foraminotomy as well as L4-5 transforaminal lumbar interbody fusion.  I think a transforaminal lumbar interbody fusion at L4-5 is indicated due to her  spondylolisthesis as well as back pain.  I discussed the planned procedure at length with the patient, including the risks, benefits, alternatives, and indications. The risks discussed include but are not limited to bleeding, infection, need for reoperation, spinal fluid leak, stroke, vision loss, anesthetic complication, coma, paralysis, and even death. I also described in detail that improvement was not guaranteed.  The patient expressed understanding of these risks, and asked that we proceed with surgery. I described the surgery in layman's terms, and gave ample opportunity for questions, which were answered to the best of my ability.  I spent a total of 15 minutes in this patient's care today. This time was spent reviewing pertinent records including imaging studies, obtaining and confirming history, performing a directed evaluation, formulating and discussing my recommendations, and documenting the visit within the medical record.   Jodeen Munch MD Department of neurosurgery

## 2023-10-23 NOTE — Addendum Note (Signed)
 Addended by: Fahd Galea on: 10/23/2023 10:29 AM   Modules accepted: Orders

## 2023-10-27 LAB — NICOTINE/COTININE METABOLITES
Cotinine: <1 ng/mL
Nicotine: <1 ng/mL

## 2023-11-05 ENCOUNTER — Other Ambulatory Visit: Payer: Self-pay

## 2023-11-05 ENCOUNTER — Encounter
Admission: RE | Admit: 2023-11-05 | Discharge: 2023-11-05 | Disposition: A | Discharge status: Home / Self Care | Source: Ambulatory Visit | Attending: Neurosurgery | Admitting: Neurosurgery

## 2023-11-05 VITALS — BP 127/65 | HR 85 | Temp 98.2°F | Resp 16 | Ht 62.0 in | Wt 158.0 lb

## 2023-11-05 DIAGNOSIS — E1159 Type 2 diabetes mellitus with other circulatory complications: Secondary | ICD-10-CM

## 2023-11-05 DIAGNOSIS — E785 Hyperlipidemia, unspecified: Secondary | ICD-10-CM | POA: Diagnosis not present

## 2023-11-05 DIAGNOSIS — E1169 Type 2 diabetes mellitus with other specified complication: Secondary | ICD-10-CM | POA: Insufficient documentation

## 2023-11-05 DIAGNOSIS — Z01818 Encounter for other preprocedural examination: Secondary | ICD-10-CM | POA: Diagnosis not present

## 2023-11-05 DIAGNOSIS — I152 Hypertension secondary to endocrine disorders: Secondary | ICD-10-CM | POA: Diagnosis not present

## 2023-11-05 DIAGNOSIS — Z01812 Encounter for preprocedural laboratory examination: Secondary | ICD-10-CM

## 2023-11-05 DIAGNOSIS — Z01810 Encounter for preprocedural cardiovascular examination: Secondary | ICD-10-CM | POA: Diagnosis not present

## 2023-11-05 LAB — URINALYSIS, ROUTINE W REFLEX MICROSCOPIC
Bilirubin Urine: NEGATIVE
Glucose, UA: >1000 mg/dL — AB
Hgb urine dipstick: NEGATIVE
Leukocytes,Ua: NEGATIVE
Nitrite: NEGATIVE
Protein, ur: NEGATIVE mg/dL
Specific Gravity, Urine: 1.015 (ref 1.005–1.030)
pH: 5 (ref 5.0–8.0)

## 2023-11-05 LAB — BASIC METABOLIC PANEL WITH GFR
Anion gap: 10 (ref 5–15)
BUN: 20 mg/dL (ref 8–23)
CO2: 21 mmol/L — ABNORMAL LOW (ref 22–32)
Calcium: 9.3 mg/dL (ref 8.9–10.3)
Chloride: 112 mmol/L — ABNORMAL HIGH (ref 98–111)
Creatinine, Ser: 0.56 mg/dL (ref 0.44–1.00)
GFR, Estimated: >60 mL/min (ref >60)
Glucose, Bld: 109 mg/dL — ABNORMAL HIGH (ref 70–99)
Potassium: 4.2 mmol/L (ref 3.5–5.1)
Sodium: 143 mmol/L (ref 135–145)

## 2023-11-05 LAB — TYPE AND SCREEN
ABO/RH(D): B POS
Antibody Screen: NEGATIVE

## 2023-11-05 LAB — SURGICAL PCR SCREEN
MRSA, PCR: POSITIVE — AB
Staphylococcus aureus: POSITIVE — AB

## 2023-11-05 NOTE — Patient Instructions (Signed)
 Your procedure is scheduled on: Friday 11/16/23  Report to the Registration Desk on the 1st floor of the Medical Mall. To find out your arrival time, please call [phone] between 1PM - 3PM on: Thursday 11/15/23  If your arrival time is 6:00 am, do not arrive before that time as the Medical Mall entrance doors do not open until 6:00 am.  REMEMBER: Instructions that are not followed completely may result in serious medical risk, up to and including death; or upon the discretion of your surgeon and anesthesiologist your surgery may need to be rescheduled.  Do not eat food after midnight the night before surgery.  No gum chewing or hard candies.  You may however, drink CLEAR liquids up to 2 hours before you are scheduled to arrive for your surgery. Do not drink anything within 2 hours of your scheduled arrival time.  Clear liquids include: - water  Do NOT drink anything that is not on this list.   One week prior to surgery: Stop Anti-inflammatories (NSAIDS) such as Advil, Aleve, Ibuprofen, Motrin, Naproxen, Naprosyn and Aspirin  based products such as Excedrin, Goody's Powder, BC Powder. Stop ANY OVER THE COUNTER supplements until after surgery. Cyanocobalamin  (B-12)   You may however, continue to take Tylenol  if needed for pain up until the day of surgery.  Stop aspirin  EC 81 MG 7 days prior to surgery (take last dose Thursday 11/09/23) Stop empagliflozin  (JARDIANCE ) 25 MG 3 days prior to surgery (take last dose Monday 11/12/23) Stop metFORMIN  (GLUCOPHAGE -XR) 750 MG 2 days prior to surgery (take last dose Tuesday 11/13/23)  Continue taking all of your other prescription medications up until the day of surgery.  ON THE DAY OF SURGERY ONLY TAKE THESE MEDICATIONS WITH SIPS OF WATER:  gabapentin  (NEURONTIN ) 300 MG  atorvastatin  (LIPITOR) 40 MG   Use inhalers on the day of surgery and bring to the hospital. Fluticasone -Umeclidin-Vilant (TRELEGY ELLIPTA ) 200-62.5-25 MCG/ACT AEPB   No  Alcohol for 24 hours before or after surgery.  No Smoking including e-cigarettes for 24 hours before surgery.  No chewable tobacco products for at least 6 hours before surgery.  No nicotine  patches on the day of surgery.  Do not use any "recreational" drugs for at least a week (preferably 2 weeks) before your surgery.  Please be advised that the combination of cocaine and anesthesia may have negative outcomes, up to and including death. If you test positive for cocaine, your surgery will be cancelled.  On the morning of surgery brush your teeth with toothpaste and water, you may rinse your mouth with mouthwash if you wish. Do not swallow any toothpaste or mouthwash.  Use CHG Soap or wipes as directed on instruction sheet.  Do not wear jewelry, make-up, hairpins, clips or nail polish.  For welded (permanent) jewelry: bracelets, anklets, waist bands, etc.  Please have this removed prior to surgery.  If it is not removed, there is a chance that hospital personnel will need to cut it off on the day of surgery.  Do not wear lotions, powders, or perfumes.   Do not shave body hair from the neck down 48 hours before surgery.  Contact lenses, hearing aids and dentures may not be worn into surgery.  Do not bring valuables to the hospital. [facility] is not responsible for any missing/lost belongings or valuables.   Total Shoulder Arthroplasty:  use Benzoyl Peroxide 5% Gel as directed on instruction sheet.  Bring your C-PAP to the hospital in case you may have to  spend the night.   Notify your doctor if there is any change in your medical condition (cold, fever, infection).  Wear comfortable clothing (specific to your surgery type) to the hospital.  After surgery, you can help prevent lung complications by doing breathing exercises.  Take deep breaths and cough every 1-2 hours. Your doctor may order a device called an Incentive Spirometer to help you take deep breaths. When coughing or  sneezing, hold a pillow firmly against your incision with both hands. This is called "splinting." Doing this helps protect your incision. It also decreases belly discomfort.  If you are being admitted to the hospital overnight, leave your suitcase in the car. After surgery it may be brought to your room.  In case of increased patient census, it may be necessary for you, the patient, to continue your postoperative care in the Same Day Surgery department.  If you are being discharged the day of surgery, you will not be allowed to drive home. You will need a responsible individual to drive you home and stay with you for 24 hours after surgery.   If you are taking public transportation, you will need to have a responsible individual with you.  Please call the Pre-admissions Testing Dept. at [phone] if you have any questions about these instructions.  Surgery Visitation Policy:  Patients having surgery or a procedure may have two visitors.  Children under the age of 69 must have an adult with them who is not the patient.  Inpatient Visitation:    Visiting hours are 7 a.m. to 8 p.m. Up to four visitors are allowed at one time in a patient room. The visitors may rotate out with other people during the day.  One visitor age 69 or older may stay with the patient overnight and must be in the room by 8 p.m.    Pre-operative 5 CHG Bath Instructions   You can play a key role in reducing the risk of infection after surgery. Your skin needs to be as free of germs as possible. You can reduce the number of germs on your skin by washing with CHG (chlorhexidine  gluconate) soap before surgery. CHG is an antiseptic soap that kills germs and continues to kill germs even after washing.   DO NOT use if you have an allergy to chlorhexidine /CHG or antibacterial soaps. If your skin becomes reddened or irritated, stop using the CHG and notify one of our RNs at [phone].   Please shower with the CHG  soap starting 4 days before surgery using the following schedule:     Please keep in mind the following:  DO NOT shave, including legs and underarms, starting the day of your first shower.   You may shave your face at any point before/day of surgery.  Place clean sheets on your bed the day you start using CHG soap. Use a clean washcloth (not used since being washed) for each shower. DO NOT sleep with pets once you start using the CHG.   CHG Shower Instructions:  If you choose to wash your hair and private area, wash first with your normal shampoo/soap.  After you use shampoo/soap, rinse your hair and body thoroughly to remove shampoo/soap residue.  Turn the water OFF and apply about 3 tablespoons (45 ml) of CHG soap to a CLEAN washcloth.  Apply CHG soap ONLY FROM YOUR NECK DOWN TO YOUR TOES (washing for 3-5 minutes)  DO NOT use CHG soap on face, private areas, open wounds, or sores.  Pay special attention to the area where your surgery is being performed.  If you are having back surgery, having someone wash your back for you may be helpful. Wait 2 minutes after CHG soap is applied, then you may rinse off the CHG soap.  Pat dry with a clean towel  Put on clean clothes/pajamas   If you choose to wear lotion, please use ONLY the CHG-compatible lotions on the back of this paper.     Additional instructions for the day of surgery: DO NOT APPLY any lotions, deodorants, cologne, or perfumes.   Put on clean/comfortable clothes.  Brush your teeth.  Ask your nurse before applying any prescription medications to the skin.      CHG Compatible Lotions   Aveeno Moisturizing lotion  Cetaphil Moisturizing Cream  Cetaphil Moisturizing Lotion  Clairol Herbal Essence Moisturizing Lotion, Dry Skin  Clairol Herbal Essence Moisturizing Lotion, Extra Dry Skin  Clairol Herbal Essence Moisturizing Lotion, Normal Skin  Curel Age Defying Therapeutic Moisturizing Lotion with Alpha Hydroxy  Curel  Extreme Care Body Lotion  Curel Soothing Hands Moisturizing Hand Lotion  Curel Therapeutic Moisturizing Cream, Fragrance-Free  Curel Therapeutic Moisturizing Lotion, Fragrance-Free  Curel Therapeutic Moisturizing Lotion, Original Formula  Eucerin Daily Replenishing Lotion  Eucerin Dry Skin Therapy Plus Alpha Hydroxy Crme  Eucerin Dry Skin Therapy Plus Alpha Hydroxy Lotion  Eucerin Original Crme  Eucerin Original Lotion  Eucerin Plus Crme Eucerin Plus Lotion  Eucerin TriLipid Replenishing Lotion  Keri Anti-Bacterial Hand Lotion  Keri Deep Conditioning Original Lotion Dry Skin Formula Softly Scented  Keri Deep Conditioning Original Lotion, Fragrance Free Sensitive Skin Formula  Keri Lotion Fast Absorbing Fragrance Free Sensitive Skin Formula  Keri Lotion Fast Absorbing Softly Scented Dry Skin Formula  Keri Original Lotion  Keri Skin Renewal Lotion Keri Silky Smooth Lotion  Keri Silky Smooth Sensitive Skin Lotion  Nivea Body Creamy Conditioning Oil  Nivea Body Extra Enriched Teacher, adult education Moisturizing Lotion Nivea Crme  Nivea Skin Firming Lotion  NutraDerm 30 Skin Lotion  NutraDerm Skin Lotion  NutraDerm Therapeutic Skin Cream  NutraDerm Therapeutic Skin Lotion  ProShield Protective Hand Cream  Provon moisturizing lotion

## 2023-11-05 NOTE — Progress Notes (Signed)
 Sent In basket to Dr Mont Antis for + MRSA +Staph due to Renate Caroline NP being out of the office today

## 2023-11-06 DIAGNOSIS — Z79899 Other long term (current) drug therapy: Secondary | ICD-10-CM | POA: Diagnosis not present

## 2023-11-06 DIAGNOSIS — L309 Dermatitis, unspecified: Secondary | ICD-10-CM | POA: Diagnosis not present

## 2023-11-14 ENCOUNTER — Telehealth: Payer: Self-pay | Admitting: Neurosurgery

## 2023-11-14 NOTE — Telephone Encounter (Signed)
 We have not filled her gabapentin  since January. Looks like she was getting from neurology and then from a Dr. Vallie Gay.   Refill request for gabapentin  from pharmacy. Please call her and find out how much she is taking and how often. Will deny it for now.   Of note, she is scheduled for L4-L5 TLIF on 11/16/23 with Dr. Mont Antis.

## 2023-11-15 MED ORDER — GABAPENTIN 300 MG PO CAPS: 300.0000 mg | ORAL_CAPSULE | Freq: Two times a day (BID) | ORAL | 1 refills | Status: DC

## 2023-11-15 NOTE — Telephone Encounter (Signed)
 Gabapentin  sent to her pharmacy. Please let her know.

## 2023-11-15 NOTE — Telephone Encounter (Signed)
 Is she still taking 300mg  bid?   Let me know and I can fill.

## 2023-11-15 NOTE — Telephone Encounter (Signed)
 Spoke with the patient and she confirmed she is taking 300mg  BID.

## 2023-11-15 NOTE — Telephone Encounter (Signed)
 I spoke to patient and she states her other Dr is not filling the medication. She is out and would like another refill.

## 2023-11-15 NOTE — Addendum Note (Signed)
 Addended byLucetta Russel on: 11/15/2023 07:30 PM   Modules accepted: Orders

## 2023-11-16 ENCOUNTER — Inpatient Hospital Stay: Admitting: General Practice

## 2023-11-16 ENCOUNTER — Other Ambulatory Visit: Payer: Self-pay

## 2023-11-16 ENCOUNTER — Encounter
Admission: RE | Disposition: A | Discharge status: Home Health | Payer: Self-pay | Source: Home / Self Care | Attending: Neurosurgery

## 2023-11-16 ENCOUNTER — Inpatient Hospital Stay
Admission: RE | Admit: 2023-11-16 | Discharge: 2023-11-19 | DRG: 402 | Disposition: A | Discharge status: Home Health | Attending: Neurosurgery | Admitting: Neurosurgery

## 2023-11-16 ENCOUNTER — Inpatient Hospital Stay

## 2023-11-16 ENCOUNTER — Encounter: Payer: Self-pay | Admitting: Neurosurgery

## 2023-11-16 DIAGNOSIS — Z87891 Personal history of nicotine dependence: Secondary | ICD-10-CM | POA: Diagnosis not present

## 2023-11-16 DIAGNOSIS — G8929 Other chronic pain: Secondary | ICD-10-CM | POA: Diagnosis present

## 2023-11-16 DIAGNOSIS — M5441 Lumbago with sciatica, right side: Secondary | ICD-10-CM | POA: Diagnosis not present

## 2023-11-16 DIAGNOSIS — I10 Essential (primary) hypertension: Secondary | ICD-10-CM | POA: Diagnosis not present

## 2023-11-16 DIAGNOSIS — M5442 Lumbago with sciatica, left side: Secondary | ICD-10-CM

## 2023-11-16 DIAGNOSIS — M48061 Spinal stenosis, lumbar region without neurogenic claudication: Secondary | ICD-10-CM | POA: Diagnosis not present

## 2023-11-16 DIAGNOSIS — M48062 Spinal stenosis, lumbar region with neurogenic claudication: Secondary | ICD-10-CM

## 2023-11-16 DIAGNOSIS — Z947 Corneal transplant status: Secondary | ICD-10-CM | POA: Diagnosis not present

## 2023-11-16 DIAGNOSIS — Z981 Arthrodesis status: Principal | ICD-10-CM

## 2023-11-16 DIAGNOSIS — M5416 Radiculopathy, lumbar region: Secondary | ICD-10-CM | POA: Diagnosis present

## 2023-11-16 DIAGNOSIS — J449 Chronic obstructive pulmonary disease, unspecified: Secondary | ICD-10-CM | POA: Diagnosis present

## 2023-11-16 DIAGNOSIS — E1169 Type 2 diabetes mellitus with other specified complication: Secondary | ICD-10-CM

## 2023-11-16 DIAGNOSIS — Z01818 Encounter for other preprocedural examination: Secondary | ICD-10-CM

## 2023-11-16 DIAGNOSIS — K219 Gastro-esophageal reflux disease without esophagitis: Secondary | ICD-10-CM | POA: Diagnosis present

## 2023-11-16 DIAGNOSIS — M4316 Spondylolisthesis, lumbar region: Principal | ICD-10-CM

## 2023-11-16 HISTORY — PX: FAR LATERAL DECOMPRESSION 1 LEVEL: SHX7571

## 2023-11-16 HISTORY — PX: APPLICATION OF INTRAOPERATIVE CT SCAN: SHX6668

## 2023-11-16 HISTORY — PX: TRANSFORAMINAL LUMBAR INTERBODY FUSION W/ MIS 1 LEVEL: SHX6145

## 2023-11-16 LAB — GLUCOSE, CAPILLARY
Glucose-Capillary: 123 mg/dL — ABNORMAL HIGH (ref 70–99)
Glucose-Capillary: 117 mg/dL — ABNORMAL HIGH (ref 70–99)
Glucose-Capillary: 162 mg/dL — ABNORMAL HIGH (ref 70–99)

## 2023-11-16 SURGERY — MINIMALLY INVASIVE (MIS) TRANSFORAMINAL LUMBAR INTERBODY FUSION (TLIF) 1 LEVEL
Anesthesia: General | Laterality: Right

## 2023-11-16 MED ORDER — CHLORHEXIDINE GLUCONATE 4 % EX SOLN: 1.0000 | CUTANEOUS | 1 refills | Status: DC

## 2023-11-16 MED ORDER — MIDAZOLAM HCL 2 MG/2ML IJ SOLN
INTRAMUSCULAR | Status: DC | PRN
  Administered 2023-11-16: 2 mg via INTRAVENOUS

## 2023-11-16 MED ORDER — CEFAZOLIN SODIUM-DEXTROSE 2-4 GM/100ML-% IV SOLN
INTRAVENOUS | Status: AC
  Filled 2023-11-16: qty 100

## 2023-11-16 MED ORDER — ACETAMINOPHEN 10 MG/ML IV SOLN
INTRAVENOUS | Status: DC | PRN
  Administered 2023-11-16: 1000 mg via INTRAVENOUS

## 2023-11-16 MED ORDER — ACETAMINOPHEN 325 MG PO TABS
650.0000 mg | ORAL_TABLET | ORAL | Status: DC | PRN
  Administered 2023-11-17 (×3): 650 mg via ORAL
  Filled 2023-11-16 (×3): qty 2

## 2023-11-16 MED ORDER — ACETAMINOPHEN 10 MG/ML IV SOLN
INTRAVENOUS | Status: AC
  Filled 2023-11-16: qty 100

## 2023-11-16 MED ORDER — FENTANYL CITRATE (PF) 100 MCG/2ML IJ SOLN
INTRAMUSCULAR | Status: DC | PRN
  Administered 2023-11-16 (×2): 50 ug via INTRAVENOUS

## 2023-11-16 MED ORDER — CEFAZOLIN SODIUM-DEXTROSE 2-4 GM/100ML-% IV SOLN
2.0000 g | Freq: Once | INTRAVENOUS | Status: AC
  Administered 2023-11-16: 2 g via INTRAVENOUS

## 2023-11-16 MED ORDER — EMPAGLIFLOZIN 25 MG PO TABS
25.0000 mg | ORAL_TABLET | Freq: Every day | ORAL | Status: DC
  Administered 2023-11-16 – 2023-11-19 (×4): 25 mg via ORAL
  Filled 2023-11-16 (×4): qty 1

## 2023-11-16 MED ORDER — ACITRETIN 25 MG PO CAPS: 25.0000 mg | ORAL_CAPSULE | Freq: Every day | ORAL | Status: DC

## 2023-11-16 MED ORDER — PHENYLEPHRINE 80 MCG/ML (10ML) SYRINGE FOR IV PUSH (FOR BLOOD PRESSURE SUPPORT)
PREFILLED_SYRINGE | INTRAVENOUS | Status: DC | PRN
  Administered 2023-11-16: 80 ug via INTRAVENOUS
  Administered 2023-11-16: 160 ug via INTRAVENOUS
  Administered 2023-11-16: 80 ug via INTRAVENOUS
  Administered 2023-11-16: 160 ug via INTRAVENOUS

## 2023-11-16 MED ORDER — PHENOL 1.4 % MT LIQD
1.0000 | OROMUCOSAL | Status: DC | PRN
  Administered 2023-11-16: 1 via OROMUCOSAL
  Filled 2023-11-16: qty 177

## 2023-11-16 MED ORDER — ROCURONIUM BROMIDE 10 MG/ML (PF) SYRINGE
PREFILLED_SYRINGE | INTRAVENOUS | Status: AC
  Filled 2023-11-16: qty 10

## 2023-11-16 MED ORDER — ATORVASTATIN CALCIUM 20 MG PO TABS
40.0000 mg | ORAL_TABLET | Freq: Every day | ORAL | Status: AC
Start: 2023-11-17 — End: ?
  Administered 2023-11-17 – 2023-11-19 (×3): 40 mg via ORAL
  Filled 2023-11-16 (×3): qty 2

## 2023-11-16 MED ORDER — IRRISEPT - 450ML BOTTLE WITH 0.05% CHG IN STERILE WATER, USP 99.95% OPTIME
TOPICAL | Status: DC | PRN
  Administered 2023-11-16: 450 mL via TOPICAL

## 2023-11-16 MED ORDER — ONDANSETRON HCL 4 MG PO TABS: 4.0000 mg | ORAL_TABLET | Freq: Four times a day (QID) | ORAL | Status: DC | PRN

## 2023-11-16 MED ORDER — MENTHOL 3 MG MT LOZG: 1.0000 | LOZENGE | OROMUCOSAL | Status: DC | PRN

## 2023-11-16 MED ORDER — OXYCODONE HCL 5 MG PO TABS
5.0000 mg | ORAL_TABLET | ORAL | Status: DC | PRN
  Administered 2023-11-18: 5 mg via ORAL
  Filled 2023-11-16: qty 1

## 2023-11-16 MED ORDER — ONDANSETRON HCL 4 MG/2ML IJ SOLN
INTRAMUSCULAR | Status: DC | PRN
Start: 2023-11-16 — End: 2023-11-16
  Administered 2023-11-16: 4 mg via INTRAVENOUS

## 2023-11-16 MED ORDER — SUGAMMADEX SODIUM 200 MG/2ML IV SOLN
INTRAVENOUS | Status: DC | PRN
  Administered 2023-11-16: 160 mg via INTRAVENOUS

## 2023-11-16 MED ORDER — LACTATED RINGERS IV SOLN: INTRAVENOUS | Status: DC | PRN

## 2023-11-16 MED ORDER — METHOCARBAMOL 1000 MG/10ML IJ SOLN
500.0000 mg | Freq: Four times a day (QID) | INTRAMUSCULAR | Status: DC | PRN
  Administered 2023-11-16: 500 mg via INTRAVENOUS

## 2023-11-16 MED ORDER — KETOROLAC TROMETHAMINE 15 MG/ML IJ SOLN
INTRAMUSCULAR | Status: AC
  Filled 2023-11-16: qty 1

## 2023-11-16 MED ORDER — SODIUM CHLORIDE (PF) 0.9 % IJ SOLN
INTRAMUSCULAR | Status: DC | PRN
  Administered 2023-11-16: 60 mL

## 2023-11-16 MED ORDER — ENOXAPARIN SODIUM 40 MG/0.4ML IJ SOSY
40.0000 mg | PREFILLED_SYRINGE | INTRAMUSCULAR | Status: DC
  Administered 2023-11-17 – 2023-11-19 (×3): 40 mg via SUBCUTANEOUS
  Filled 2023-11-16 (×3): qty 0.4

## 2023-11-16 MED ORDER — SODIUM CHLORIDE 0.9% FLUSH: 3.0000 mL | INTRAVENOUS | Status: DC | PRN

## 2023-11-16 MED ORDER — OXYCODONE HCL 5 MG PO TABS
10.0000 mg | ORAL_TABLET | ORAL | Status: DC | PRN
  Administered 2023-11-16 – 2023-11-17 (×3): 10 mg via ORAL
  Administered 2023-11-18: 5 mg via ORAL
  Administered 2023-11-18 – 2023-11-19 (×3): 10 mg via ORAL
  Filled 2023-11-16 (×6): qty 2

## 2023-11-16 MED ORDER — INSULIN ASPART 100 UNIT/ML IJ SOLN
0.0000 [IU] | Freq: Three times a day (TID) | INTRAMUSCULAR | Status: DC
  Administered 2023-11-18: 2 [IU] via SUBCUTANEOUS
  Administered 2023-11-19: 3 [IU] via SUBCUTANEOUS
  Filled 2023-11-16 (×2): qty 1

## 2023-11-16 MED ORDER — POLYETHYLENE GLYCOL 3350 17 G PO PACK: 17.0000 g | PACK | Freq: Every day | ORAL | Status: DC | PRN

## 2023-11-16 MED ORDER — SODIUM CHLORIDE 0.9 % IV SOLN: INTRAVENOUS | Status: DC

## 2023-11-16 MED ORDER — DOCUSATE SODIUM 100 MG PO CAPS
100.0000 mg | ORAL_CAPSULE | Freq: Two times a day (BID) | ORAL | Status: DC
  Administered 2023-11-16 – 2023-11-19 (×5): 100 mg via ORAL
  Filled 2023-11-16 (×5): qty 1

## 2023-11-16 MED ORDER — LIDOCAINE HCL (PF) 2 % IJ SOLN
INTRAMUSCULAR | Status: AC
  Filled 2023-11-16: qty 5

## 2023-11-16 MED ORDER — FENTANYL CITRATE (PF) 100 MCG/2ML IJ SOLN
INTRAMUSCULAR | Status: AC
Start: 2023-11-16 — End: 2023-11-16
  Filled 2023-11-16: qty 2

## 2023-11-16 MED ORDER — PHENYLEPHRINE HCL-NACL 20-0.9 MG/250ML-% IV SOLN
INTRAVENOUS | Status: DC | PRN
Start: 2023-11-16 — End: 2023-11-16
  Administered 2023-11-16: 40 ug/min via INTRAVENOUS

## 2023-11-16 MED ORDER — OXYCODONE HCL 5 MG/5ML PO SOLN: 5.0000 mg | Freq: Once | ORAL | Status: DC | PRN

## 2023-11-16 MED ORDER — ORAL CARE MOUTH RINSE
15.0000 mL | Freq: Once | OROMUCOSAL | Status: AC
Start: 2023-11-16 — End: 2023-11-16

## 2023-11-16 MED ORDER — HYDROMORPHONE HCL 1 MG/ML IJ SOLN
INTRAMUSCULAR | Status: AC
  Filled 2023-11-16: qty 1

## 2023-11-16 MED ORDER — ONDANSETRON HCL 4 MG/2ML IJ SOLN
INTRAMUSCULAR | Status: AC
  Filled 2023-11-16: qty 2

## 2023-11-16 MED ORDER — MAGNESIUM CITRATE PO SOLN: 1.0000 | Freq: Once | ORAL | Status: DC | PRN

## 2023-11-16 MED ORDER — FENTANYL CITRATE (PF) 100 MCG/2ML IJ SOLN
25.0000 ug | INTRAMUSCULAR | Status: DC | PRN
  Administered 2023-11-16 (×4): 25 ug via INTRAVENOUS

## 2023-11-16 MED ORDER — ONDANSETRON HCL 4 MG/2ML IJ SOLN: 4.0000 mg | Freq: Four times a day (QID) | INTRAMUSCULAR | Status: DC | PRN

## 2023-11-16 MED ORDER — OXYCODONE HCL 5 MG PO TABS: 5.0000 mg | ORAL_TABLET | Freq: Once | ORAL | Status: DC | PRN

## 2023-11-16 MED ORDER — BUDESON-GLYCOPYRROL-FORMOTEROL 160-9-4.8 MCG/ACT IN AERO
2.0000 | INHALATION_SPRAY | Freq: Two times a day (BID) | RESPIRATORY_TRACT | Status: DC
  Administered 2023-11-16: 2 via RESPIRATORY_TRACT
  Filled 2023-11-16: qty 5.9

## 2023-11-16 MED ORDER — SORBITOL 70 % SOLN: 30.0000 mL | Freq: Every day | Status: DC | PRN

## 2023-11-16 MED ORDER — HYDROMORPHONE HCL 1 MG/ML IJ SOLN
INTRAMUSCULAR | Status: DC | PRN
  Administered 2023-11-16: .5 mg via INTRAVENOUS

## 2023-11-16 MED ORDER — CHLORHEXIDINE GLUCONATE 0.12 % MT SOLN
OROMUCOSAL | Status: AC
  Filled 2023-11-16: qty 15

## 2023-11-16 MED ORDER — PROPOFOL 10 MG/ML IV BOLUS
INTRAVENOUS | Status: DC | PRN
Start: 2023-11-16 — End: 2023-11-16
  Administered 2023-11-16: 150 mg via INTRAVENOUS

## 2023-11-16 MED ORDER — MORPHINE SULFATE (PF) 2 MG/ML IV SOLN
1.0000 mg | INTRAVENOUS | Status: AC | PRN
  Administered 2023-11-16: 1 mg via INTRAVENOUS
  Filled 2023-11-16: qty 1

## 2023-11-16 MED ORDER — KETOROLAC TROMETHAMINE 15 MG/ML IJ SOLN
7.5000 mg | Freq: Four times a day (QID) | INTRAMUSCULAR | Status: AC
  Administered 2023-11-16 – 2023-11-17 (×3): 7.5 mg via INTRAVENOUS
  Filled 2023-11-16 (×2): qty 1

## 2023-11-16 MED ORDER — DEXAMETHASONE SODIUM PHOSPHATE 10 MG/ML IJ SOLN
INTRAMUSCULAR | Status: DC | PRN
  Administered 2023-11-16: 5 mg via INTRAVENOUS

## 2023-11-16 MED ORDER — OXYCODONE HCL 5 MG PO TABS
ORAL_TABLET | ORAL | Status: AC
  Filled 2023-11-16: qty 2

## 2023-11-16 MED ORDER — ALBUTEROL SULFATE (2.5 MG/3ML) 0.083% IN NEBU: 2.5000 mg | INHALATION_SOLUTION | Freq: Four times a day (QID) | RESPIRATORY_TRACT | Status: DC | PRN

## 2023-11-16 MED ORDER — INSULIN ASPART 100 UNIT/ML IJ SOLN
0.0000 [IU] | Freq: Every day | INTRAMUSCULAR | Status: DC
  Administered 2023-11-17: 3 [IU] via SUBCUTANEOUS
  Filled 2023-11-16: qty 1

## 2023-11-16 MED ORDER — SODIUM CHLORIDE 0.9 % IV SOLN
250.0000 mL | INTRAVENOUS | Status: AC
  Administered 2023-11-16: 250 mL via INTRAVENOUS

## 2023-11-16 MED ORDER — 0.9 % SODIUM CHLORIDE (POUR BTL) OPTIME
TOPICAL | Status: DC | PRN
  Administered 2023-11-16: 400 mL

## 2023-11-16 MED ORDER — CHLORHEXIDINE GLUCONATE 0.12 % MT SOLN
15.0000 mL | Freq: Once | OROMUCOSAL | Status: AC
  Administered 2023-11-16: 15 mL via OROMUCOSAL

## 2023-11-16 MED ORDER — LIDOCAINE HCL (CARDIAC) PF 100 MG/5ML IV SOSY
PREFILLED_SYRINGE | INTRAVENOUS | Status: DC | PRN
  Administered 2023-11-16: 6 mg via INTRAVENOUS

## 2023-11-16 MED ORDER — SENNA 8.6 MG PO TABS
1.0000 | ORAL_TABLET | Freq: Two times a day (BID) | ORAL | Status: DC
  Administered 2023-11-16 – 2023-11-19 (×5): 8.6 mg via ORAL
  Filled 2023-11-16 (×5): qty 1

## 2023-11-16 MED ORDER — METHOCARBAMOL 1000 MG/10ML IJ SOLN
INTRAMUSCULAR | Status: AC
  Filled 2023-11-16: qty 10

## 2023-11-16 MED ORDER — ACETAMINOPHEN 500 MG PO TABS
1000.0000 mg | ORAL_TABLET | Freq: Four times a day (QID) | ORAL | Status: DC
  Administered 2023-11-16 – 2023-11-19 (×6): 1000 mg via ORAL
  Filled 2023-11-16 (×9): qty 2

## 2023-11-16 MED ORDER — MUPIROCIN 2 % EX OINT: 1.0000 | TOPICAL_OINTMENT | Freq: Two times a day (BID) | CUTANEOUS | 0 refills | Status: DC

## 2023-11-16 MED ORDER — ACETAMINOPHEN 650 MG RE SUPP: 650.0000 mg | RECTAL | Status: DC | PRN

## 2023-11-16 MED ORDER — GABAPENTIN 300 MG PO CAPS
300.0000 mg | ORAL_CAPSULE | Freq: Two times a day (BID) | ORAL | Status: DC
  Administered 2023-11-16 – 2023-11-17 (×2): 300 mg via ORAL
  Filled 2023-11-16 (×2): qty 1

## 2023-11-16 MED ORDER — ROCURONIUM BROMIDE 100 MG/10ML IV SOLN
INTRAVENOUS | Status: DC | PRN
  Administered 2023-11-16: 50 mg via INTRAVENOUS

## 2023-11-16 MED ORDER — METHOCARBAMOL 500 MG PO TABS
500.0000 mg | ORAL_TABLET | Freq: Four times a day (QID) | ORAL | Status: DC | PRN
  Administered 2023-11-18: 500 mg via ORAL
  Filled 2023-11-16: qty 1

## 2023-11-16 MED ORDER — METFORMIN HCL ER 750 MG PO TB24
1500.0000 mg | ORAL_TABLET | Freq: Every day | ORAL | Status: DC
  Administered 2023-11-17 – 2023-11-19 (×3): 1500 mg via ORAL
  Filled 2023-11-16 (×3): qty 2

## 2023-11-16 MED ORDER — SODIUM CHLORIDE 0.9% FLUSH
3.0000 mL | Freq: Two times a day (BID) | INTRAVENOUS | Status: DC
  Administered 2023-11-16 – 2023-11-19 (×6): 3 mL via INTRAVENOUS

## 2023-11-16 MED ORDER — TRIAMCINOLONE ACETONIDE 0.1 % EX CREA
1.0000 | TOPICAL_CREAM | Freq: Two times a day (BID) | CUTANEOUS | Status: DC
  Administered 2023-11-17 – 2023-11-19 (×5): 1 via TOPICAL
  Filled 2023-11-16: qty 15

## 2023-11-16 MED ORDER — SURGIFLO WITH THROMBIN (HEMOSTATIC MATRIX KIT) OPTIME
TOPICAL | Status: DC | PRN
  Administered 2023-11-16: 1 via TOPICAL

## 2023-11-16 MED ORDER — PROPOFOL 10 MG/ML IV BOLUS
INTRAVENOUS | Status: AC
  Filled 2023-11-16: qty 20

## 2023-11-16 MED ORDER — ALBUTEROL SULFATE HFA 108 (90 BASE) MCG/ACT IN AERS: 2.0000 | INHALATION_SPRAY | Freq: Four times a day (QID) | RESPIRATORY_TRACT | Status: DC | PRN

## 2023-11-16 MED ORDER — MIDAZOLAM HCL 2 MG/2ML IJ SOLN
INTRAMUSCULAR | Status: AC
  Filled 2023-11-16: qty 2

## 2023-11-16 SURGICAL SUPPLY — 55 items
ALLOGRAFT BONE FIBER KORE 5 (Bone Implant) IMPLANT
BASIN KIT SINGLE STR (MISCELLANEOUS) ×2 IMPLANT
BUR NEURO DRILL SOFT 3.0X3.8M (BURR) ×2 IMPLANT
COVERAGE SUPP BRAINLAB NG SPNE (MISCELLANEOUS) ×2 IMPLANT
DERMABOND ADVANCED .7 DNX12 (GAUZE/BANDAGES/DRESSINGS) ×2 IMPLANT
DRAPE C ARM PK CFD 31 SPINE (DRAPES) ×2 IMPLANT
DRAPE LAPAROTOMY 100X77 ABD (DRAPES) ×2 IMPLANT
DRAPE MICROSCOPE SPINE 48X150 (DRAPES) ×2 IMPLANT
DRAPE SCAN PATIENT (DRAPES) ×2 IMPLANT
DRSG OPSITE POSTOP 3X4 (GAUZE/BANDAGES/DRESSINGS) ×2 IMPLANT
DRSG OPSITE POSTOP 4X8 (GAUZE/BANDAGES/DRESSINGS) IMPLANT
DRSG TEGADERM 4X4.75 (GAUZE/BANDAGES/DRESSINGS) IMPLANT
ELECTRODE EZSTD 165MM 6.5IN (MISCELLANEOUS) ×2 IMPLANT
ELECTRODE REM PT RTRN 9FT ADLT (ELECTROSURGICAL) ×2 IMPLANT
EVACUATOR 1/8 PVC DRAIN (DRAIN) IMPLANT
EX-PIN ORTHOLOCK NAV 4X150 (PIN) IMPLANT
FEE CVG SUPP BRAINLAB NG SPNE (MISCELLANEOUS) ×2 IMPLANT
GAUZE 4X4 16PLY ~~LOC~~+RFID DBL (SPONGE) IMPLANT
GAUZE SPONGE 2X2 STRL 8-PLY (GAUZE/BANDAGES/DRESSINGS) IMPLANT
GLOVE BIOGEL PI IND STRL 6.5 (GLOVE) ×4 IMPLANT
GLOVE SURG SYN 6.5 ES PF (GLOVE) ×4 IMPLANT
GLOVE SURG SYN 6.5 PF PI (GLOVE) ×4 IMPLANT
GLOVE SURG SYN 8.5 E (GLOVE) ×8 IMPLANT
GLOVE SURG SYN 8.5 PF PI (GLOVE) ×8 IMPLANT
GOWN SRG LRG LVL 4 IMPRV REINF (GOWNS) ×6 IMPLANT
GOWN SRG XL LVL 3 NONREINFORCE (GOWNS) ×2 IMPLANT
GUIDEWIRE NITINOL BEVEL TIP (WIRE) IMPLANT
HOLDER FOLEY CATH W/STRAP (MISCELLANEOUS) IMPLANT
INTERBODY SABLE 10X26 7-14 15D (Miscellaneous) IMPLANT
KIT SPINAL PRONEVIEW (KITS) ×2 IMPLANT
LAVAGE JET IRRISEPT WOUND (IRRIGATION / IRRIGATOR) ×2 IMPLANT
MANIFOLD NEPTUNE II (INSTRUMENTS) ×2 IMPLANT
MARKER SKIN DUAL TIP RULER LAB (MISCELLANEOUS) ×2 IMPLANT
MARKER SPHERE PSV REFLC 13MM (MARKER) ×14 IMPLANT
NDL SAFETY ECLIPSE 18X1.5 (NEEDLE) ×2 IMPLANT
NS IRRIG 500ML POUR BTL (IV SOLUTION) ×2 IMPLANT
PACK LAMINECTOMY ARMC (PACKS) ×2 IMPLANT
PAD ARMBOARD POSITIONER FOAM (MISCELLANEOUS) ×2 IMPLANT
ROD RELINE MAS TI 5.5X35MM LRD (Rod) IMPLANT
ROD RELINE MAS TI LORD 5.5X40 (Rod) ×4 IMPLANT
SCREW LOCK RELINE 5.5 TULIP (Screw) IMPLANT
SCREW RELINE MAS POLY 6.5X40MM (Screw) IMPLANT
SCREW RELINE RED 6.5X45MM POLY (Screw) IMPLANT
SURGIFLO W/THROMBIN 8M KIT (HEMOSTASIS) ×2 IMPLANT
SUT STRATA 3-0 15 PS-2 (SUTURE) ×2 IMPLANT
SUT VIC AB 0 CT1 27XCR 8 STRN (SUTURE) ×4 IMPLANT
SUT VIC AB 2-0 CT1 18 (SUTURE) ×4 IMPLANT
SUTURE EHLN 3-0 FS-10 30 BLK (SUTURE) IMPLANT
SYR 30ML LL (SYRINGE) ×4 IMPLANT
SYR 3ML LL SCALE MARK (SYRINGE) ×2 IMPLANT
TOWEL OR 17X26 4PK STRL BLUE (TOWEL DISPOSABLE) ×2 IMPLANT
TRAP FLUID SMOKE EVACUATOR (MISCELLANEOUS) ×2 IMPLANT
TRAY FOLEY SLVR 16FR LF STAT (SET/KITS/TRAYS/PACK) IMPLANT
WATER STERILE IRR 1000ML POUR (IV SOLUTION) IMPLANT
WATER STERILE IRR 500ML POUR (IV SOLUTION) IMPLANT

## 2023-11-16 NOTE — Discharge Instructions (Signed)
 Your surgeon has performed an operation on your lumbar spine (low back) to relieve pressure on one or more nerves. Many times, patients feel better immediately after surgery and can "overdo it." Even if you feel well, it is important that you follow these activity guidelines. If you do not let your back heal properly from the surgery, you can increase the chance of hardware complications and/or return of your symptoms. The following are instructions to help in your recovery once you have been discharged from the hospital.  Do not use NSAIDs for 3 months after surgery.  *Regarding compression stockings-  Please wear day and night until you are walking a couple hundred feet three times a day.   Activity    No bending, lifting, or twisting ("BLT"). Avoid lifting objects heavier than 10 pounds (gallon milk jug).  Where possible, avoid household activities that involve lifting, bending, pushing, or pulling such as laundry, vacuuming, grocery shopping, and childcare. Try to arrange for help from friends and family for these activities while your back heals.  Increase physical activity slowly as tolerated.  Taking short walks is encouraged, but avoid strenuous exercise. Do not jog, run, bicycle, lift weights, or participate in any other exercises unless specifically allowed by your doctor. Avoid prolonged sitting, including car rides.  Talk to your doctor before resuming sexual activity.  You should not drive until cleared by your doctor.  Until released by your doctor, you should not return to work or school.  You should rest at home and let your body heal.   You may shower three days after your surgery.  After showering, lightly dab your incision dry. Do not take a tub bath or go swimming for 3 weeks, or until approved by your doctor at your follow-up appointment.  If you smoke, we strongly recommend that you quit.  Smoking has been proven to interfere with normal healing in your back and will  dramatically reduce the success rate of your surgery. Please contact [facility] (800-QUIT-NOW) and use the resources at www.[facility].com for assistance in stopping smoking.  Surgical Incision   If you have a dressing on your incision, you may remove it three days after your surgery. Keep your incision area clean and dry.  Your incision was closed with Dermabond glue. The glue should begin to peel away within about a week.  Diet            You may return to your usual diet. Be sure to stay hydrated.  When to Contact Us   Although your surgery and recovery will likely be uneventful, you may have some residual numbness, aches, and pains in your back and/or legs. This is normal and should improve in the next few weeks.  However, should you experience any of the following, contact us  immediately: New numbness or weakness Pain that is progressively getting worse, and is not relieved by your pain medications or rest Bleeding, redness, swelling, pain, or drainage from surgical incision Chills or flu-like symptoms Fever greater than 101.0 F (38.3 C) Problems with bowel or bladder functions Difficulty breathing or shortness of breath Warmth, tenderness, or swelling in your calf  Contact Information How to contact us :  If you have any questions/concerns before or after surgery, you can reach us  at [phone], or you can send a mychart message. We can be reached by phone or mychart 8am-4pm, Monday-Friday.  *Please note: Calls after 4pm are forwarded to a third party answering service. Mychart messages are not routinely monitored during evenings,  weekends, and holidays. Please call our office to contact the answering service for urgent concerns during non-business hours.

## 2023-11-16 NOTE — Anesthesia Procedure Notes (Signed)
 Procedure Name: Intubation Date/Time: 11/16/2023 1:24 PM  Performed by: Bill Budd, CRNAPre-anesthesia Checklist: Patient identified, Patient being monitored, Timeout performed, Emergency Drugs available and Suction available Patient Re-evaluated:Patient Re-evaluated prior to induction Oxygen  Delivery Method: Circle system utilized Preoxygenation: Pre-oxygenation with 100% oxygen  Induction Type: IV induction Ventilation: Mask ventilation without difficulty Laryngoscope Size: 3 and McGrath Grade View: Grade I Tube type: Oral Tube size: 7.0 mm Number of attempts: 1 Airway Equipment and Method: Stylet Placement Confirmation: ETT inserted through vocal cords under direct vision, positive ETCO2 and breath sounds checked- equal and bilateral Secured at: 22 cm Tube secured with: Tape Dental Injury: Teeth and Oropharynx as per pre-operative assessment

## 2023-11-16 NOTE — Interval H&P Note (Signed)
 History and Physical Interval Note:  11/16/2023 12:38 PM  Julie Wall  has presented today for surgery, with the diagnosis of M43.16 Spondylolisthesis of lumbar region M54.42, M54.41, G89.29 Chronic bilateral low back pain with bilateral sciatica M54.16 Lumbar radiculopathy M48.062 Neurogenic claudication due to lumbar spinal stenosis.  The various methods of treatment have been discussed with the patient and family. After consideration of risks, benefits and other options for treatment, the patient has consented to  Procedure(s) with comments: L4-5 MINIMALLY INVASIVE (MIS) TRANSFORAMINAL LUMBAR INTERBODY FUSION (TLIF) (N/A) - L4-5 TLIF FAR LATERAL DECOMPRESSION 1 LEVEL (Right) - RIGHT L5-S1 FAR LATERAL FORAMINOTOMY, L3-4 DECOMPRESSION APPLICATION OF INTRAOPERATIVE CT SCAN (N/A) as a surgical intervention.  The patient's history has been reviewed, patient examined, no change in status, stable for surgery.  I have reviewed the patient's chart and labs.  Questions were answered to the patient's satisfaction.    Heart sounds normal no MRG. Chest Clear to Auscultation Bilaterally.  [name]

## 2023-11-16 NOTE — Transfer of Care (Signed)
 Immediate Anesthesia Transfer of Care Note  Patient: Julie Wall  Procedure(s) Performed: L4-5 MINIMALLY INVASIVE (MIS) TRANSFORAMINAL LUMBAR INTERBODY FUSION (TLIF) FAR LATERAL DECOMPRESSION 1 LEVEL (Right) APPLICATION OF INTRAOPERATIVE CT SCAN  Patient Location: PACU  Anesthesia Type:General  Level of Consciousness: awake, alert , and oriented  Airway & Oxygen  Therapy: Patient Spontanous Breathing  Post-op Assessment: Report given to RN and Post -op Vital signs reviewed and stable  Post vital signs: Reviewed and stable  Last Vitals:  Vitals Value Taken Time  BP 87/63 11/16/23 17:12  Temp    Pulse 73 11/16/23 17:13  Resp    SpO2 99 % 11/16/23 17:13  Vitals shown include unfiled device data.  Last Pain:  Vitals:   11/16/23 1214  PainSc: 8          Complications: No notable events documented.

## 2023-11-16 NOTE — Progress Notes (Signed)
 1840-Patient transferred to 1A ROOM 136 and daughters at bedside. Writer received report from [facility] in PACU at bedside. VS taken, see doc flow. Patient is alert. Call bell in reach.  1900-Cares transferred at this time to oncoming RN.

## 2023-11-16 NOTE — Op Note (Signed)
 Indications: Julie Wall is suffering from M43.16 Spondylolisthesis of lumbar region, M54.42, M54.41, G89.29 Chronic bilateral low back pain with bilateral sciatica, M54.16 Lumbar radiculopathy, M48.062 Neurogenic claudication due to lumbar spinal stenosis. The patient tried and failed conservative management, prompting surgical intervention.  Findings: successful TLIF procedure  Preoperative Diagnosis: M43.16 Spondylolisthesis of lumbar region, M54.42, M54.41, G89.29 Chronic bilateral low back pain with bilateral sciatica, M54.16 Lumbar radiculopathy, M48.062 Neurogenic claudication due to lumbar spinal stenosis  Postoperative Diagnosis: same   EBL: 100 ml IVF: See anesthesia record Drains: 1 placed Disposition: Extubated and Stable to PACU Complications: none  A foley catheter was placed.   Preoperative Note:   Risks of surgery discussed include: infection, bleeding, stroke, coma, death, paralysis, CSF leak, nerve/spinal cord injury, numbness, tingling, weakness, complex regional pain syndrome, recurrent stenosis and/or disc herniation, vascular injury, development of instability, neck/back pain, need for further surgery, persistent symptoms, development of deformity, and the risks of anesthesia. The patient understood these risks and agreed to proceed.  Operative Note:  1. Transforaminal Lumbar Interbody Fusion L4/5 2. Posterolateral arthrodesis L4 to L5 3. Posterior nonsegmental instrumentation L4 to L5 using Nuvasive Reline 4. Use of stereotaxis ([employer]) 5. Harvesting of autograft via the same incision 6. Placement of a biomechanical device (Globus Sable) at L4/5 for anterior arthrodesis 7. L3/4 decompression 8. R L5/S1 far lateral discectomy and foraminotomy  The patient was brought to the Operating Room, intubated and turned into the prone position. All pressure points were checked and double checked. The patient was prepped and draped in the standard fashion. A full  timeout was performed. Preoperative antibiotics were given.   After draping, the stereotactic array was placed.  Stereotactic imaging was acquired and registered to the [place] system.  The image guidance system was used to choose bilateral [place] incisions. The incisions were injected with local anesthetic.  Each incision was opened with a scalpel, bovie electrocautery was used to open the fascia.  Using stereotactic guidance, each pedicle from L4-L5was cannulated and K wire secured.  We then placed pedicle screws over each K wire on the contralateral side.  We placed the ipsilateral pedicle screws after the TLIF procedure was performed.  6.5x27mm Nuvasive Reline screws were used bilaterally at L4 and 6.5x40 at L5.   After placement of pedicle screws, we then turned our attention to transforaminal lumbar interbody fusion.  A tubular retractor was advanced over the right L4/5 facet. The microscope was brought into the field.  The right L4/5 facet was removed with osteotomes and the drill, and handed off for preparation as autograft. The traversing and exiting nerve roots on the right were identified and protected. The disc was opened using a scalpel. After incising the disc space, we took a combination of pituitary rongeurs, Kerrison rongeurs, disc scrapers, and curettes to remove a majority of the disc material.  We prepared the end plates for accepting the interbody fusion.  We removed the cartilaginous plate, preserved the cortical endplate if possible during this procedure.  The disc space was irrigated.  The Globus Sable biomechanical device was inserted, then backfilled with a mixture of allograft and autograft. During placement, the nerve roots and dural sac were carefully protected without any leaks identified. After placement of the device, the canal was fully inspected and all nerve roots were checked to ensure full decompression.  The retractor was removed.  Using the image guidance system, an  approach to the right L5-S1 foramen was then identified.  The Metrix retractors were  serially dilated to allow for the approach for a right sided L5-S1 far lateral foraminotomy and discectomy.  An 18 x 70 mm tube was placed.  The lateral aspect of the L5-S1 facet was identified, exposed, and gently removed with high-speed drill.  The ball-tipped probe was used to identify the foramen.  While carefully protecting the contents of the foramen, the bony edge of the superior articulating facet at S1 was removed to allow for foraminal decompression.  A small piece of disc herniation was also noted.  A herniated piece of disc was removed until the right L5 nerve root was fully decompressed.  The tubes were then removed.  We then utilized the image guidance system to identify the L3-4 level.The metrx tubes were sequentially advanced and confirmed in position. An 18mm by 60mm tube was locked in place to the bed side attachment.  Fluoroscopy was then removed from the field.  The microscope was then sterilely brought into the field and muscle creep was hemostased with a bipolar and resected with a pituitary rongeur.  A Bovie extender was then used to expose the spinous process and lamina.  Careful attention was placed to not violate the facet capsule. A 3 mm matchstick drill bit was then used to make a hemi-laminotomy trough until the ligamentum flavum was exposed.  This was extended to the base of the spinous process and to the contralateral side to remove all the central bone from each side.  Once this was complete and the underlying ligamentum flavum was visualized, it was dissected with a curette and resected with Kerrison rongeurs.  Extensive ligamentum hypertrophy was noted, requiring a substantial amount of time and care for removal.  The dura was identified and palpated. The kerrison rongeur was then used to remove the medial facet bilaterally until no compression was noted.  A balltip probe was used to confirm  decompression of the right L4 nerve root.  Additional attention was paid to completion of the contralateral L3-4 foraminotomy until the left L4 nerve root was completely free.  Once this was complete, L3-4 central decompression including medial facetectomy and foraminotomy was confirmed and decompression on both sides was confirmed. No CSF leak was noted.  The tube system was then removed under microscopic visualization and hemostasis was obtained with a bipolar.     The right sided screws were then placed as noted above.   Rods measured and placed. The rods were secured using locking caps to manufacturer's specifications. CT scan was taken to confirm instrumentation placement. The wound was copiously irrigated, then the posterior elements were prepared for arthrodesis.  A mixture of allograft and autograft was placed over the decorticated surfaces for arthrodesis.   A subfascial drain was placed on the right.  After hemostasis, the wound was closed in layers with 0 and 2-0 vicryl. 3-0 monocryl and dermabond was applied to the incision. A sterile dressing was placed.  The patient was then flipped supine and extubated with incident. All counts were correct times 2 at the end of the case. No immediate complications were noted.  Anastacio Karvonen PA assisted in the entire procedure. An assistant was required for this procedure due to the complexity.  The assistant provided assistance in tissue manipulation and suction, and was required for the successful and safe performance of the procedure. I performed the critical portions of the procedure.   Jodeen Munch MD

## 2023-11-16 NOTE — Anesthesia Preprocedure Evaluation (Signed)
 Anesthesia Evaluation  Patient identified by MRN, date of birth, ID band Patient awake    Reviewed: Allergy & Precautions, NPO status , Patient's Chart, lab work & pertinent test results  Airway Mallampati: IV  TM Distance: >3 FB Neck ROM: full    Dental  (+) Dental Advidsory Given, Teeth Intact   Pulmonary shortness of breath and with exertion, COPD,  COPD inhaler, Patient abstained from smoking., former smoker   Pulmonary exam normal        Cardiovascular hypertension, (-) angina (-) Past MI negative cardio ROS Normal cardiovascular exam(-) dysrhythmias      Neuro/Psych negative neurological ROS  negative psych ROS   GI/Hepatic Neg liver ROS,GERD  Medicated,,  Endo/Other  negative endocrine ROSdiabetes    Renal/GU      Musculoskeletal   Abdominal   Peds  Hematology negative hematology ROS (+)   Anesthesia Other Findings Past Medical History: No date: Allergy No date: Arthritis 09/2013: ASCUS favor benign     Comment:  negative HPV No date: Atopic dermatitis     Comment:  Dermatologist at [facility] No date: Chronic low back pain No date: COPD (chronic obstructive pulmonary disease) (HCC) No date: Decreased exercise tolerance No date: Dental caries No date: Diabetes mellitus without complication (HCC) No date: Dyspnea No date: GERD (gastroesophageal reflux disease) 10/13/2021: Hand dermatitis     Comment:  a.) PCR (+) 10/13/2021 and 08/17/2022 for MSSA + SDSE               (Streptococcus dysgalactiae ss equisimilis) No date: Hyperlipidemia No date: Hypertension No date: Legally blind in left eye, as defined in [place]  No date: Lumbosacral neuritis No date: Microalbuminuria No date: Osteopenia No date: Ovarian failure No date: Sleep apnea     Comment:  no cpap No date: Tobacco use  Past Surgical History: 06/05/1978: CATARACT EXTRACTION; Bilateral No date: CESAREAN SECTION     Comment:  x 2 No date: CORNEAL  TRANSPLANT; Left     Comment:  [facility] for blindness.  BMI    Body Mass Index: 32.52 kg/m      Reproductive/Obstetrics negative OB ROS                             Anesthesia Physical Anesthesia Plan  ASA: 3  Anesthesia Plan: General ETT   Post-op Pain Management:    Induction: Intravenous  PONV Risk Score and Plan: 3 and Propofol  infusion, Ondansetron , Dexamethasone  and TIVA  Airway Management Planned: Oral ETT  Additional Equipment:   Intra-op Plan:   Post-operative Plan: Extubation in OR  Informed Consent: I have reviewed the patients History and Physical, chart, labs and discussed the procedure including the risks, benefits and alternatives for the proposed anesthesia with the patient or authorized representative who has indicated his/her understanding and acceptance.     Dental Advisory Given  Plan Discussed with: Anesthesiologist, CRNA and Surgeon  Anesthesia Plan Comments: (Patient consented for risks of anesthesia including but not limited to:  - adverse reactions to medications - damage to eyes, teeth, lips or other oral mucosa - nerve damage due to positioning  - sore throat or hoarseness - Damage to heart, brain, nerves, lungs, other parts of body or loss of life  Patient voiced understanding and assent.)       Anesthesia Quick Evaluation

## 2023-11-17 LAB — GLUCOSE, CAPILLARY
Glucose-Capillary: 84 mg/dL (ref 70–99)
Glucose-Capillary: 131 mg/dL — ABNORMAL HIGH (ref 70–99)
Glucose-Capillary: 135 mg/dL — ABNORMAL HIGH (ref 70–99)
Glucose-Capillary: 141 mg/dL — ABNORMAL HIGH (ref 70–99)
Glucose-Capillary: 173 mg/dL — ABNORMAL HIGH (ref 70–99)

## 2023-11-17 LAB — CBC
HCT: 36.3 % (ref 36.0–46.0)
Hemoglobin: 12.3 g/dL (ref 12.0–15.0)
MCH: 31.2 pg (ref 26.0–34.0)
MCHC: 33.9 g/dL (ref 30.0–36.0)
MCV: 92.1 fL (ref 80.0–100.0)
Platelets: 244 10*3/uL (ref 150–400)
RBC: 3.94 MIL/uL (ref 3.87–5.11)
RDW: 12.5 % (ref 11.5–15.5)
WBC: 10.2 10*3/uL (ref 4.0–10.5)
nRBC: 0 % (ref 0.0–0.2)

## 2023-11-17 MED ORDER — CELECOXIB 200 MG PO CAPS
200.0000 mg | ORAL_CAPSULE | Freq: Two times a day (BID) | ORAL | Status: DC
  Administered 2023-11-18 – 2023-11-19 (×3): 200 mg via ORAL
  Filled 2023-11-17 (×3): qty 1

## 2023-11-17 MED ORDER — GABAPENTIN 300 MG PO CAPS
300.0000 mg | ORAL_CAPSULE | Freq: Three times a day (TID) | ORAL | Status: DC
  Administered 2023-11-17 – 2023-11-19 (×6): 300 mg via ORAL
  Filled 2023-11-17 (×6): qty 1

## 2023-11-17 NOTE — Plan of Care (Signed)
  Problem: Clinical Measurements: Goal: Diagnostic test results will improve Outcome: Progressing   Problem: Nutrition: Goal: Adequate nutrition will be maintained Outcome: Progressing   Problem: Coping: Goal: Level of anxiety will decrease Outcome: Progressing   Problem: Pain Managment: Goal: General experience of comfort will improve and/or be controlled Outcome: Progressing   Problem: Metabolic: Goal: Ability to maintain appropriate glucose levels will improve Outcome: Progressing   Problem: Pain Management: Goal: Pain level will decrease Outcome: Progressing

## 2023-11-17 NOTE — Evaluation (Addendum)
 Physical Therapy Evaluation Patient Details Name: Julie Wall MRN: [id number] DOB: 1955/11/18 Today's Date: 11/17/2023  History of Present Illness  Pt admitted to [facility] on 11/16/23 for elective L4-5 TLIF with L3-4 decompression and R L5-S1 lateral discectomy and foraminotomy by Dr. Mont Antis. Significant PMH includes: T2DM, HLD, neurogenic claudication, obesity, HTN, and hx of T11-12 dcompression and fusion (2024).   Clinical Impression  Pt is a 68 year old F admitted to hospital on 6/13 for spinal fusion. At baseline, pt is mod I for ADL's, IADL's, limited ambulation with rollator, and medication management; family provides assistance with transportation.   Pt presents with increased pain levels resulting in functional weakness, decreased activity tolerance, and decreased gross balance, indicating decrease in function from baseline. Due to deficits, pt required min-mod assist for bed mobility, CGA for transfers, and CGA to ambulate 21ft EOB>recliner with RW. Increased time/effort required for all functional tasks this date secondary to increased pain levels. Pt able to recall 4/4 precautions at beginning of session without cueing indicating good carry over and safety awareness.  Deficits limit the pt's ability to safely and independently perform ADL's, transfer, and ambulate. Pt will benefit from acute skilled PT services to address deficits for return to baseline function. Pt will benefit from post acute therapy services to address deficits for return to baseline function.         If plan is discharge home, recommend the following: A little help with bathing/dressing/bathroom;Assistance with cooking/housework;Assist for transportation;Help with stairs or ramp for entrance   Can travel by private vehicle    Yes    Equipment Recommendations Rolling walker (2 wheels) (tub transfer bench)  Recommendations for Other Services       Functional Status Assessment Patient has had a recent decline in  their functional status and demonstrates the ability to make significant improvements in function in a reasonable and predictable amount of time.     Precautions / Restrictions Precautions Precautions: Back Precaution Booklet Issued: Yes (comment) Precaution/Restrictions Comments: no brace Restrictions Weight Bearing Restrictions Per Provider Order: No      Mobility  Bed Mobility Overal bed mobility: Needs Assistance Bed Mobility: Rolling, Sidelying to Sit Rolling: Min assist Sidelying to sit: Mod assist       General bed mobility comments: Min assist for hip facilitation for rolling to the LEFT to exit bed via log roll technique, with mod assist for trunk facilitation for upright seated posture at EOB. Multimodal cues for safety, sequencing, hand placement, and spinal precautions. Increased time/effort secondary to pain levels. Supervision for safety to scoot anteriorly towards EOB, BUE for support.    Transfers Overall transfer level: Needs assistance Equipment used: Rolling walker (2 wheels) Transfers: Sit to/from Stand Sit to Stand: Contact guard assist           General transfer comment: CGA for safety to stand from EOB with RW, verbal cues for safety, sequencing, and hand placement. Increased time/effort and forward flexed posture to facilitate transfer. CGA for safety to sit in recliner with RW, verbal cues for safety, sequencing, and hand placement. Demonstrates Fair eccentric lowering with proper hand placement.    Ambulation/Gait Ambulation/Gait assistance: Contact guard assist Gait Distance (Feet): 2 Feet Assistive device: Rolling walker (2 wheels)         General Gait Details: CGA for safety due to hx of knee buckling, to ambulate from EOB>recliner with RW. Demonstrates slowed cadence with decreased step length/foot clearance bilaterally.    Balance Overall balance assessment: Needs assistance Sitting-balance  support: Bilateral upper extremity supported,  Feet supported Sitting balance-Leahy Scale: Fair     Standing balance support: Bilateral upper extremity supported, During functional activity Standing balance-Leahy Scale: Fair Standing balance comment: BUE support on RW                             Pertinent Vitals/Pain Pain Assessment Pain Assessment: 0-10 Pain Score: 5  Pain Location: incisional Pain Descriptors / Indicators: Aching Pain Intervention(s): Limited activity within patient's tolerance, Monitored during session, Premedicated before session, Repositioned    Home Living Family/patient expects to be discharged to:: Private residence (Apt) Living Arrangements: Alone Available Help at Discharge: Family;Available 24 hours/day (son/daughter) Type of Home: Apartment Home Access: Stairs to enter   [employer] of Steps: back entrance: 2 STE no railing; front entrance: about 5 large/Cherry steps without railing, additional 10 steps with B handrail, cannot reach at same time. Goes through back entrance.   Home Layout: One level Home Equipment: Rollator (4 wheels);Shower seat;Cane - single point Additional Comments: difficulty with tub transfers    Prior Function Prior Level of Function : History of Falls (last six months)             Mobility Comments: Ambulating with rollator limited amb inside/outside the home; hx of falls due to BLE buckling. Family asissts with transportation. ADLs Comments: mod I with IADL's, ADL's, and medication management.     Extremity/Trunk Assessment   Upper Extremity Assessment Upper Extremity Assessment: Defer to OT evaluation    Lower Extremity Assessment Lower Extremity Assessment: Overall WFL for tasks assessed (not formally assessed secondary to c/o incisional back pain; observed to be at least 3+/5 as pt was able to WB through LE for transfers and gait without buckling)    Cervical / Trunk Assessment Cervical / Trunk Assessment: Normal  Communication    Communication Communication: No apparent difficulties    Cognition Arousal: Alert Behavior During Therapy: WFL for tasks assessed/performed   PT - Cognitive impairments: No apparent impairments                         Following commands: Intact       Cueing Cueing Techniques: Verbal cues, Tactile cues, Visual cues     General Comments General comments (skin integrity, edema, etc.): hemovac in place pre/post session, mild strikethrough noted to incisional bandages. Able to wean pt to RA from 2L with SpO2 reading 93% at end of session.    Exercises Other Exercises Other Exercises: Pt educated re: PT Role/POC, DC recommendations, spinal precautions w/ packet, pursed lip breathing, OOB to chair benefits, call for help, log roll technique, potential EMS transport into home pending progress with PT/stair straining. She verbalized understanding.   Assessment/Plan    PT Assessment Patient needs continued PT services  PT Problem List Decreased strength;Decreased activity tolerance;Decreased balance;Decreased mobility;Obesity;Pain       PT Treatment Interventions DME instruction;Gait training;Stair training;Functional mobility training;Therapeutic activities;Therapeutic exercise;Balance training;Neuromuscular re-education;Patient/family education    PT Goals (Current goals can be found in the Care Plan section)  Acute Rehab PT Goals Patient Stated Goal: go home PT Goal Formulation: With patient Time For Goal Achievement: 12/01/23 Potential to Achieve Goals: Good    Frequency BID        AM-PAC PT 6 Clicks Mobility  Outcome Measure Help needed turning from your back to your side while in a flat bed without using bedrails?: A Little  Help needed moving from lying on your back to sitting on the side of a flat bed without using bedrails?: A Lot Help needed moving to and from a bed to a chair (including a wheelchair)?: A Little Help needed standing up from a chair using  your arms (e.g., wheelchair or bedside chair)?: A Little Help needed to walk in hospital room?: A Little Help needed climbing 3-5 steps with a railing? : A Lot 6 Click Score: 16    End of Session Equipment Utilized During Treatment: Gait belt;Oxygen  Activity Tolerance: Patient tolerated treatment well Patient left: in chair;with call bell/phone within reach;with chair alarm set Nurse Communication: Mobility status PT Visit Diagnosis: Unsteadiness on feet (R26.81);Muscle weakness (generalized) (M62.81);History of falling (Z91.81);Pain Pain - part of body:  (back)    Time: 1914-7829 PT Time Calculation (min) (ACUTE ONLY): 36 min   Charges:   PT Evaluation $PT Eval Moderate Complexity: 1 Mod PT Treatments $Therapeutic Activity: 8-22 mins PT General Charges $$ ACUTE PT VISIT: 1 Visit        Branda Cain, PT, DPT 11:07 AM,11/17/23 Physical Therapist - [facility]

## 2023-11-17 NOTE — Evaluation (Signed)
 Occupational Therapy Evaluation Patient Details Name: Julie Wall MRN: [id number] DOB: [birth year] Today's Date: 11/17/2023   History of Present Illness   Pt admitted to [facility] on 11/16/23 for elective L4-5 TLIF with L3-4 decompression and R L5-S1 lateral discectomy and foraminotomy by Dr. Mont Antis. Significant PMH includes: T2DM, HLD, neurogenic claudication, obesity, HTN, and hx of T11-12 dcompression and fusion (2024).     Clinical Impressions Pt seen this date for OT evaluation.  Pt lives at home alone in an apartment and has supportive family to assist as needed.  PTA, pt was modified independent with ADL and IADL tasks, family assisted with transportation and occasional household cleaning.  She presents with increased pain, difficulty with transfers, functional mobility and managing self care skills.  She would benefit from skilled OT services to maximize safety and independence in necessary daily tasks to return home.       If plan is discharge home, recommend the following:   A little help with walking and/or transfers;Assistance with cooking/housework;Assist for transportation;A little help with bathing/dressing/bathroom     Functional Status Assessment   Patient has had a recent decline in their functional status and demonstrates the ability to make significant improvements in function in a reasonable and predictable amount of time.     Equipment Recommendations         Recommendations for Other Services         Precautions/Restrictions   Precautions Precautions: Back Precaution/Restrictions Comments: no brace Restrictions Weight Bearing Restrictions Per Provider Order: No     Mobility Bed Mobility Overal bed mobility: Needs Assistance Bed Mobility: Rolling, Sidelying to Sit Rolling: Min assist Sidelying to sit: Mod assist       General bed mobility comments: Pt requires cues for hand placement and technique for transfers    Transfers Overall  transfer level: Needs assistance Equipment used: Rolling walker (2 wheels) Transfers: Sit to/from Stand Sit to Stand: Contact guard assist                  Balance Overall balance assessment: Needs assistance Sitting-balance support: Bilateral upper extremity supported, Feet supported Sitting balance-Leahy Scale: Fair     Standing balance support: Bilateral upper extremity supported, During functional activity Standing balance-Leahy Scale: Fair Standing balance comment: BUE support on RW                           ADL either performed or assessed with clinical judgement   ADL Overall ADL's : Needs assistance/impaired Eating/Feeding: Modified independent   Grooming: Set up;Sitting   Upper Body Bathing: Set up;Sitting   Lower Body Bathing: Moderate assistance   Upper Body Dressing : Set up   Lower Body Dressing: Moderate assistance   Toilet Transfer: Minimal assistance   Toileting- Clothing Manipulation and Hygiene: Minimal assistance       Functional mobility during ADLs: Rollator (4 wheels) General ADL Comments: Pt appears limited by pain this date with self care tasks. Reports her daughter helps at times with household tasks. She does not drive, family assists with transportation     Vision Baseline Vision/History: 1 Wears glasses (glasses for reading)       Perception         Praxis         Pertinent Vitals/Pain Pain Assessment Pain Assessment: 0-10 Pain Score: 7  Pain Location: incisional Pain Descriptors / Indicators: Aching Pain Intervention(s): Limited activity within patient's tolerance, Monitored during session, Repositioned  Extremity/Trunk Assessment Upper Extremity Assessment Upper Extremity Assessment: Generalized weakness   Lower Extremity Assessment Lower Extremity Assessment: Overall WFL for tasks assessed (not formally assessed secondary to c/o incisional back pain; observed to be at least 3+/5 as pt was able to WB  through LE for transfers and gait without buckling)   Cervical / Trunk Assessment Cervical / Trunk Assessment: Normal   Communication Communication Communication: No apparent difficulties   Cognition Arousal: Alert Behavior During Therapy: WFL for tasks assessed/performed                                 Following commands: Intact       Cueing  General Comments   Cueing Techniques: Verbal cues;Tactile cues;Visual cues  hemovac in place pre/post session, mild strikethrough noted to incisional bandages. Able to wean pt to RA from 2L with SpO2 reading 93% at end of session.   Exercises     Shoulder Instructions      Home Living Family/patient expects to be discharged to:: Private residence Living Arrangements: Alone Available Help at Discharge: Family;Available 24 hours/day Type of Home: Apartment   Entrance Stairs-Number of Steps: back entrance: 2 STE no railing; front entrance: about 5 large/[place] steps without railing, additional 10 steps with B handrail, cannot reach at same time. Goes through back entrance.   Home Layout: One level     Bathroom Shower/Tub: Tub/shower unit;Door   [employer] Toilet: Standard Bathroom Accessibility: Yes   Home Equipment: Rollator (4 wheels);Shower seat;Cane - single point          Prior Functioning/Environment Prior Level of Function : History of Falls (last six months)             Mobility Comments: Ambulating with rollator limited amb inside/outside the home; hx of falls due to BLE buckling, reports 2 falls in last year.  Family assists with transportation. ADLs Comments: mod I with IADL's, ADL's, and medication management.    OT Problem List: Decreased strength;Pain;Decreased activity tolerance;Impaired balance (sitting and/or standing);Decreased knowledge of precautions   OT Treatment/Interventions: Self-care/ADL training;Therapeutic exercise;Therapeutic activities;DME and/or AE instruction;Patient/family  education;Balance training      OT Goals(Current goals can be found in the care plan section)   Acute Rehab OT Goals Patient Stated Goal: to return home and be as independent as possible OT Goal Formulation: With patient Time For Goal Achievement: 12/01/23 Potential to Achieve Goals: Good ADL Goals Pt Will Perform Lower Body Dressing: with modified independence Pt Will Transfer to Toilet: with modified independence   OT Frequency:  Min 2X/week    Co-evaluation              AM-PAC OT 6 Clicks Daily Activity     Outcome Measure Help from another person eating meals?: None Help from another person taking care of personal grooming?: None Help from another person toileting, which includes using toliet, bedpan, or urinal?: A Little Help from another person bathing (including washing, rinsing, drying)?: A Little Help from another person to put on and taking off regular upper body clothing?: None Help from another person to put on and taking off regular lower body clothing?: A Lot 6 Click Score: 20   End of Session Equipment Utilized During Treatment: Rolling walker (2 wheels)  Activity Tolerance: Patient limited by lethargy;Patient limited by pain Patient left: in bed;with call bell/phone within reach;with bed alarm set  OT Visit Diagnosis: Unsteadiness on feet (R26.81);History of falling (Z91.81);Pain;Muscle  weakness (generalized) (M62.81) Pain - part of body:  (back)                Time: 1610-9604 OT Time Calculation (min): 21 min Charges:  OT General Charges $OT Visit: 1 Visit OT Evaluation $OT Eval Moderate Complexity: 1 Mod  Richad Ramsay T Kaid Seeberger, OTR/L, CLT Chaunice Obie 11/17/2023, 1:07 PM

## 2023-11-17 NOTE — Anesthesia Postprocedure Evaluation (Signed)
 Anesthesia Post Note  Patient: Sean Cavness  Procedure(s) Performed: L4-5 MINIMALLY INVASIVE (MIS) TRANSFORAMINAL LUMBAR INTERBODY FUSION (TLIF) FAR LATERAL DECOMPRESSION 1 LEVEL (Right) APPLICATION OF INTRAOPERATIVE CT SCAN  Patient location during evaluation: PACU Anesthesia Type: General Level of consciousness: awake and alert Pain management: pain level controlled Vital Signs Assessment: post-procedure vital signs reviewed and stable Respiratory status: spontaneous breathing, nonlabored ventilation, respiratory function stable and patient connected to nasal cannula oxygen  Cardiovascular status: blood pressure returned to baseline and stable Postop Assessment: no apparent nausea or vomiting Anesthetic complications: no   No notable events documented.   Last Vitals:  Vitals:   11/17/23 0003 11/17/23 0033  BP: (!) 90/40 (!) 98/49  Pulse: 88 75  Resp: 16   Temp: 36.7 C   SpO2: 100% 100%    Last Pain:  Vitals:   11/16/23 2345  TempSrc:   PainSc: 8                  Portia Brittle Cheston Coury

## 2023-11-17 NOTE — Progress Notes (Signed)
 Neurosurgery Progress Note  History: Julie Wall is here for spondylolisthesis.  She underwent L4-5 transforaminal lumbar interbody fusion, right L5-S1 far lateral foraminotomy and discectomy, and L3-4 decompression.  POD1: She has expected back pain.  She is complaining of some numbness in her legs.  Physical Exam: Vitals:   11/17/23 0454 11/17/23 0746  BP: (!) 125/57 (!) 117/58  Pulse: 85 92  Resp: 19 17  Temp: 98.8 F (37.1 C) 99.3 F (37.4 C)  SpO2: 100% 100%    AA Ox3 CNI  Strength:5/5 throughout bilateral lower extremities with exception of right iliopsoas and extension which are 4+ out of 5  Her sensation is symmetric but diminished to light touch.  She feels some burning in her right leg.  Data:  Other tests/results:   Drain output 60  Assessment/Plan:  [name] is recovering from lumbar intervention.  She has history of thoracic myelopathy which clouds her sensory findings.  - mobilize - pain control -will reevaluate pain medication - DVT prophylaxis - PTOT - Continue drain   Jodeen Munch MD, [facility] Department of Neurosurgery

## 2023-11-17 NOTE — Progress Notes (Signed)
 Physical Therapy Treatment Patient Details Name: Julie Wall MRN: [id number] DOB: 1956/02/13 Today's Date: 11/17/2023   History of Present Illness Pt admitted to [facility] on 11/16/23 for elective L4-5 TLIF with L3-4 decompression and R L5-S1 lateral discectomy and foraminotomy by Dr. Mont Antis. Significant PMH includes: T2DM, HLD, neurogenic claudication, obesity, HTN, and hx of T11-12 dcompression and fusion (2024).    PT Comments  Pt tolerated treatment fair today and was overall limited with progression of mobility secondary to increased pain levels despite pre-medication with Tylenol . Pt able to make some improvements in rolling, ambulation distance, and seated balance at EOB from eval this morning. She continues to require increased assist for bed mobility secondary to increased pain levels.  Ambulates 62ft x2 from EOB<>BSC with RW at [facility] level for toileting. Demonstrates good safety awareness with education regarding spinal precautions in functional context. Pt will continue to benefit from skilled acute PT services to address deficits for return to baseline function.    If plan is discharge home, recommend the following: A little help with bathing/dressing/bathroom;Assistance with cooking/housework;Assist for transportation;Help with stairs or ramp for entrance   Can travel by private vehicle        Equipment Recommendations  Rolling walker (2 wheels) (tub transfer bench)    Recommendations for Other Services       Precautions / Restrictions Precautions Precautions: Back Precaution Booklet Issued: Yes (comment) Precaution/Restrictions Comments: no brace Restrictions Weight Bearing Restrictions Per Provider Order: No     Mobility  Bed Mobility Overal bed mobility: Needs Assistance Bed Mobility: Rolling, Sidelying to Sit Rolling: Contact guard assist Sidelying to sit: Mod assist       General bed mobility comments: CGA for safety to exit bed via log roll technique to the RIGHT,  with mod assist for trunk facilitation for upright seated posture at EOB. Multimodal cues for safety, sequencing, hand placement. Increased time/effort secondary to pain levels. Supervision for safety to scoot anteriorly towards EOB, BUE for support.    Transfers Overall transfer level: Needs assistance Equipment used: Rolling walker (2 wheels) Transfers: Sit to/from Stand Sit to Stand: Contact guard assist           General transfer comment: CGA for safety to stand from EOB and BSC with RW, verbal cues for safety, sequencing, and hand placement. Increased time/effort and forward flexed posture to facilitate transfer. CGA for safety to sit on BSC and EOB with RW, verbal cues for safety, sequencing, and hand placement. Demonstrates Fair eccentric lowering with proper hand placement.    Ambulation/Gait Ambulation/Gait assistance: Contact guard assist Gait Distance (Feet): 6 Feet (34ft x2 (EOB<>BSC)) Assistive device: Rolling walker (2 wheels)         General Gait Details: CGA for safety due to hx of knee buckling, to ambulate from EOB<>BSC with RW. Demonstrates slowed cadence with decreased step length/foot clearance bilaterally and increased guarding due to pain levels.     Balance Overall balance assessment: Needs assistance Sitting-balance support: Bilateral upper extremity supported, Feet supported Sitting balance-Leahy Scale: Good     Standing balance support: Bilateral upper extremity supported, During functional activity Standing balance-Leahy Scale: Fair Standing balance comment: BUE support on RW                            Communication Communication Communication: No apparent difficulties  Cognition Arousal: Alert Behavior During Therapy: WFL for tasks assessed/performed   PT - Cognitive impairments: No apparent impairments  Following commands: Intact      Cueing Cueing Techniques: Verbal cues, Tactile cues,  Visual cues  Exercises Other Exercises Other Exercises: Pt educated re: PT Role/POC, DC recommendations, spinal precautions w/ packet, pursed lip breathing, OOB to chair benefits, call for help, log roll technique, potential EMS transport into home pending progress with PT/stair straining. She verbalized understanding.    General Comments General comments (skin integrity, edema, etc.): hemovac in place pre/post session. Cues for PLB throughout session due to increased pain levels with intermittent valsalva maneuver and c/o mild lightheadedness that is alleviated with proper breathing and sit>supine transfer.      Pertinent Vitals/Pain Pain Assessment Pain Assessment: 0-10 Pain Score: 8  Pain Location: incisional Pain Descriptors / Indicators: Aching Pain Intervention(s): Limited activity within patient's tolerance, Monitored during session, Premedicated before session, Repositioned    Home Living Family/patient expects to be discharged to:: Private residence Living Arrangements: Alone Available Help at Discharge: Family;Available 24 hours/day Type of Home: Apartment     Entrance Stairs-Number of Steps: back entrance: 2 STE no railing; front entrance: about 5 large/[place] steps without railing, additional 10 steps with B handrail, cannot reach at same time. Goes through back entrance.   Home Layout: One level Home Equipment: Rollator (4 wheels);Shower seat;Cane - single point          PT Goals (current goals can now be found in the care plan section) Acute Rehab PT Goals Patient Stated Goal: go home PT Goal Formulation: With patient Time For Goal Achievement: 12/01/23 Potential to Achieve Goals: Good Progress towards PT goals: Progressing toward goals    Frequency    BID      PT Plan      Co-evaluation              AM-PAC PT 6 Clicks Mobility   Outcome Measure  Help needed turning from your back to your side while in a flat bed without using bedrails?: A  Little Help needed moving from lying on your back to sitting on the side of a flat bed without using bedrails?: A Lot Help needed moving to and from a bed to a chair (including a wheelchair)?: A Little Help needed standing up from a chair using your arms (e.g., wheelchair or bedside chair)?: A Little Help needed to walk in hospital room?: A Little Help needed climbing 3-5 steps with a railing? : A Lot 6 Click Score: 16    End of Session Equipment Utilized During Treatment: Gait belt Activity Tolerance: Patient limited by pain Patient left: with call bell/phone within reach;in bed;with bed alarm set Nurse Communication: Mobility status PT Visit Diagnosis: Unsteadiness on feet (R26.81);Muscle weakness (generalized) (M62.81);History of falling (Z91.81);Pain Pain - part of body:  (back)     Time: 2130-8657 PT Time Calculation (min) (ACUTE ONLY): 23 min  Charges:    $Therapeutic Activity: 23-37 mins PT General Charges $$ ACUTE PT VISIT: 1 Visit                     Branda Cain, PT, DPT 2:59 PM,11/17/23 Physical Therapist - [facility]

## 2023-11-18 LAB — CBC
HCT: 35.9 % — ABNORMAL LOW (ref 36.0–46.0)
Hemoglobin: 12 g/dL (ref 12.0–15.0)
MCH: 31 pg (ref 26.0–34.0)
MCHC: 33.4 g/dL (ref 30.0–36.0)
MCV: 92.8 fL (ref 80.0–100.0)
Platelets: 213 10*3/uL (ref 150–400)
RBC: 3.87 MIL/uL (ref 3.87–5.11)
RDW: 12.8 % (ref 11.5–15.5)
WBC: 9.5 10*3/uL (ref 4.0–10.5)
nRBC: 0 % (ref 0.0–0.2)

## 2023-11-18 LAB — GLUCOSE, CAPILLARY
Glucose-Capillary: 102 mg/dL — ABNORMAL HIGH (ref 70–99)
Glucose-Capillary: 137 mg/dL — ABNORMAL HIGH (ref 70–99)
Glucose-Capillary: 100 mg/dL — ABNORMAL HIGH (ref 70–99)
Glucose-Capillary: 147 mg/dL — ABNORMAL HIGH (ref 70–99)

## 2023-11-18 NOTE — Progress Notes (Signed)
 Neurosurgery Progress Note  History: Julie Wall is here for spondylolisthesis.  She underwent L4-5 transforaminal lumbar interbody fusion, right L5-S1 far lateral foraminotomy and discectomy, and L3-4 decompression.  POD2: Some leg pain overnight POD1: She has expected back pain.  She is complaining of some numbness in her legs.  Physical Exam: Vitals:   11/18/23 0300 11/18/23 0816  BP: (!) 99/55 127/72  Pulse: 100 (!) 108  Resp: 20 16  Temp: 100.3 F (37.9 C) 100 F (37.8 C)  SpO2: 93% 95%    AA Ox3 CNI  Strength:5/5 throughout bilateral lower extremities with exception of right iliopsoas and extension which are 4+ out of 5  Her sensation is symmetric but diminished to light touch.  She feels some burning in her right leg.  Data:  Other tests/results:   Drain output 67.5  Assessment/Plan:  Julie Wall is recovering from lumbar intervention.  She has history of thoracic myelopathy which clouds her sensory findings.  - mobilize - pain control  - DVT prophylaxis - PTOT - Continue drain   Jodeen Munch MD, [facility] Department of Neurosurgery

## 2023-11-18 NOTE — Progress Notes (Signed)
 Physical Therapy Treatment Patient Details Name: Julie Wall MRN: [id number] DOB: 07-Jul-1955 Today's Date: 11/18/2023   History of Present Illness Pt admitted to [facility] on 11/16/23 for elective L4-5 TLIF with L3-4 decompression and R L5-S1 lateral discectomy and foraminotomy by Dr. Mont Antis. Significant PMH includes: T2DM, HLD, neurogenic claudication, obesity, HTN, and hx of T11-12 dcompression and fusion (2024).    PT Comments  Pt in bed.  Uses rails and min a x 1 to get to EOB R side.  Stood with min a x 1 to transfer to [facility] to void.  Is able to attend to her own pericare after voiding.  Stood and walks around bed to recliner on opposite side of bed with RW and min a x 1.  Gait generally unsteady with some post lean but no formal LOB or buckling but does benefit from +1 hands on at all times.  Seated AROM in chair.  She needs vc's/education for hand placements each time she stands and to keep RW close to her.  Stated MD told her to sleep in recliner at home.  Has rollator that she likes but at this time would be safer with RW if she agrees.  She does have 2 steps with no rails to enter her home but is unsafe to try at this time.  Stated she has a wheelchair at home and would be safer with +2 assist in chair to help her up in a seated position given strength and balance deficits.  She stated her daughter should be able to provide +1 assist 24 hours a day upon discharge.  Pt encouraged to confirm with daughter or other family that they can indeed provide assist needed.     If plan is discharge home, recommend the following: A little help with bathing/dressing/bathroom;Assistance with cooking/housework;Assist for transportation;Help with stairs or ramp for entrance   Can travel by private vehicle        Equipment Recommendations  Rolling walker (2 wheels)    Recommendations for Other Services       Precautions / Restrictions Precautions Precautions: Back Precaution Booklet Issued: Yes  (comment) Precaution/Restrictions Comments: no brace Restrictions Weight Bearing Restrictions Per Provider Order: No     Mobility  Bed Mobility Overal bed mobility: Needs Assistance Bed Mobility: Supine to Sit Rolling: Contact guard assist Sidelying to sit: Mod assist         Patient Response: Cooperative  Transfers Overall transfer level: Needs assistance Equipment used: Rolling walker (2 wheels) Transfers: Sit to/from Stand Sit to Stand: Contact guard assist, Min assist           General transfer comment: cues for hand placements throughout session    Ambulation/Gait Ambulation/Gait assistance: Min assist Gait Distance (Feet): 15 Feet Assistive device: Rolling walker (2 wheels) Gait Pattern/deviations: Step-through pattern, Decreased step length - right, Leaning posteriorly Gait velocity: dec     General Gait Details: generally unsteady and unsafe without +1 assist.   Stairs             Wheelchair Mobility     Tilt Bed Tilt Bed Patient Response: Cooperative  Modified Rankin (Stroke Patients Only)       Balance Overall balance assessment: Needs assistance Sitting-balance support: Bilateral upper extremity supported, Feet supported Sitting balance-Leahy Scale: Good     Standing balance support: Bilateral upper extremity supported, During functional activity Standing balance-Leahy Scale: Fair Standing balance comment: BUE support on RW  Communication Communication Communication: No apparent difficulties  Cognition Arousal: Alert Behavior During Therapy: WFL for tasks assessed/performed   PT - Cognitive impairments: No apparent impairments                         Following commands: Intact      Cueing Cueing Techniques: Verbal cues, Tactile cues, Visual cues  Exercises Other Exercises Other Exercises: seated AROM Other Exercises: BSC to void    General Comments        Pertinent  Vitals/Pain Pain Assessment Pain Assessment: Faces Faces Pain Scale: Hurts even more Pain Location: incisional Pain Descriptors / Indicators: Aching Pain Intervention(s): Limited activity within patient's tolerance, Monitored during session, Repositioned, Premedicated before session    Home Living                          Prior Function            PT Goals (current goals can now be found in the care plan section) Progress towards PT goals: Progressing toward goals    Frequency    BID      PT Plan      Co-evaluation              AM-PAC PT 6 Clicks Mobility   Outcome Measure  Help needed turning from your back to your side while in a flat bed without using bedrails?: A Little Help needed moving from lying on your back to sitting on the side of a flat bed without using bedrails?: A Lot Help needed moving to and from a bed to a chair (including a wheelchair)?: A Little Help needed standing up from a chair using your arms (e.g., wheelchair or bedside chair)?: A Little Help needed to walk in hospital room?: A Little Help needed climbing 3-5 steps with a railing? : Total 6 Click Score: 15    End of Session Equipment Utilized During Treatment: Gait belt Activity Tolerance: Patient limited by fatigue Patient left: in chair;with chair alarm set;with call bell/phone within reach Nurse Communication: Mobility status PT Visit Diagnosis: Unsteadiness on feet (R26.81);Muscle weakness (generalized) (M62.81);History of falling (Z91.81);Pain     Time: 1610-9604 PT Time Calculation (min) (ACUTE ONLY): 16 min  Charges:    $Gait Training: 8-22 mins PT General Charges $$ ACUTE PT VISIT: 1 Visit                   Charlanne Cong, PTA 11/18/23, 10:22 AM

## 2023-11-19 ENCOUNTER — Other Ambulatory Visit: Payer: Self-pay

## 2023-11-19 ENCOUNTER — Encounter: Payer: Self-pay | Admitting: Neurosurgery

## 2023-11-19 LAB — GLUCOSE, CAPILLARY
Glucose-Capillary: 171 mg/dL — ABNORMAL HIGH (ref 70–99)
Glucose-Capillary: 101 mg/dL — ABNORMAL HIGH (ref 70–99)

## 2023-11-19 MED ORDER — CELECOXIB 200 MG PO CAPS
200.0000 mg | ORAL_CAPSULE | Freq: Two times a day (BID) | ORAL | 0 refills | Status: DC
  Filled 2023-11-19: qty 60, 30d supply, fill #0

## 2023-11-19 MED ORDER — OXYCODONE HCL 5 MG PO TABS
5.0000 mg | ORAL_TABLET | ORAL | 0 refills | Status: DC | PRN
  Filled 2023-11-19: qty 30, 5d supply, fill #0

## 2023-11-19 MED ORDER — CHLORHEXIDINE GLUCONATE 4 % EX SOLN: 1.0000 | CUTANEOUS | 1 refills | Status: DC

## 2023-11-19 MED ORDER — SENNA 8.6 MG PO TABS
1.0000 | ORAL_TABLET | Freq: Two times a day (BID) | ORAL | 0 refills | Status: DC | PRN
Start: 2023-11-19 — End: 2023-12-18
  Filled 2023-11-19: qty 30, 15d supply, fill #0

## 2023-11-19 MED ORDER — MUPIROCIN 2 % EX OINT: 1.0000 | TOPICAL_OINTMENT | Freq: Two times a day (BID) | CUTANEOUS | 0 refills | Status: DC

## 2023-11-19 MED ORDER — METHOCARBAMOL 500 MG PO TABS
500.0000 mg | ORAL_TABLET | Freq: Four times a day (QID) | ORAL | 0 refills | Status: AC | PRN
Start: 2023-11-19 — End: ?
  Filled 2023-11-19: qty 120, 30d supply, fill #0

## 2023-11-19 NOTE — Plan of Care (Signed)
  Problem: Clinical Measurements: Goal: Diagnostic test results will improve Outcome: Progressing   Problem: Nutrition: Goal: Adequate nutrition will be maintained Outcome: Progressing   Problem: Coping: Goal: Level of anxiety will decrease Outcome: Progressing   Problem: Pain Managment: Goal: General experience of comfort will improve and/or be controlled Outcome: Progressing   Problem: Metabolic: Goal: Ability to maintain appropriate glucose levels will improve Outcome: Progressing   Problem: Pain Management: Goal: Pain level will decrease Outcome: Progressing

## 2023-11-19 NOTE — Plan of Care (Signed)

## 2023-11-19 NOTE — TOC Transition Note (Signed)
 Transition of Care [facility]) - Discharge Note   Patient Details  Name: Julie Wall MRN: [id number] Date of Birth: [birth year]  Transition of Care [facility]) CM/SW Contact:  Odilia Bennett, LCSW Phone Number: 11/19/2023, 1:13 PM   Clinical Narrative: Patient has orders to discharge home today. CSW met with patient. No family at bedside. CSW introduced role and explained that therapy recommendations would be discussed. She is agreeable to home health. No agency preference. [facility] has accepted for PT and OT. She is agreeable to RW. Ordered through [employer]. No further concerns. Patient said her brother will likely transport her home. CSW signing off.    Final next level of care: Home w Home Health Services Barriers to Discharge: No Barriers Identified   Patient Goals and CMS Choice            Discharge Placement                Patient to be transferred to facility by: Brother   Patient and family notified of of transfer: 11/19/23  Discharge Plan and Services Additional resources added to the After Visit Summary for                  DME Arranged: Walker rolling DME Agency: [employer] Date DME Agency Contacted: 11/19/23   Representative spoke with at DME Agency: Sam Creighton HH Arranged: PT, OT [facility] Agency: [facility] Date [facility] Agency Contacted: 11/19/23   Representative spoke with at [facility] Agency: Bartholomew Light  Social Drivers of Health (SDOH) Interventions SDOH Screenings   Food Insecurity: No Food Insecurity (11/16/2023)  Housing: Unknown (11/16/2023)  Transportation Needs: No Transportation Needs (11/16/2023)  Utilities: Not At Risk (11/16/2023)  Alcohol Screen: Low Risk  (07/24/2023)  Depression (PHQ2-9): Low Risk  (07/24/2023)  Financial Resource Strain: Low Risk  (07/24/2023)  Physical Activity: Sufficiently Active (07/24/2023)  Social Connections: Moderately Isolated (11/16/2023)  Stress: No Stress Concern Present (07/24/2023)  Tobacco Use: Medium Risk (11/16/2023)  Health  Literacy: Adequate Health Literacy (07/24/2023)     Readmission Risk Interventions     No data to display

## 2023-11-19 NOTE — Progress Notes (Signed)
 Occupational Therapy Treatment Patient Details Name: Julie Wall MRN: [id number] DOB: 12/15/1955 Today's Date: 11/19/2023   History of present illness Pt admitted to [facility] on 11/16/23 for elective L4-5 TLIF with L3-4 decompression and R L5-S1 lateral discectomy and foraminotomy by Dr. Mont Antis. Significant PMH includes: T2DM, HLD, neurogenic claudication, obesity, HTN, and hx of T11-12 dcompression and fusion (2024).   OT comments  Pt seen for OT tx this date. Discussed recommendation for STR with pt due to CLOF and unsteadiness during mobility, pt states daughter is available 24/7 at discharge and does not want STR. Educated pt on spinal precautions and use of AE for LB dressing independence. Requires constant cuing for hand placement during transfers, CGA t/f bathroom using RW, toileting transfer with MIN A, and completes LB dressing for socks/underwear with CGA and min vcs after visual demo. If pt to discharge home, she will require +1 hands-on assist at all times for mobility (+2 for stair navigation) and ADLs. While this Bolivar Bushman feels pt will benefit from STR, if pt wishes to return home she will need HH OT/PT with support from family 24/7. OT will continue to progress as able.       If plan is discharge home, recommend the following:  A little help with walking and/or transfers;Assistance with cooking/housework;Assist for transportation;A little help with bathing/dressing/bathroom;Help with stairs or ramp for entrance;Supervision due to cognitive status (hands on +1 assist at all times for mobility and ADLs)   Equipment Recommendations  BSC/3in1 (and RW)       Precautions / Restrictions Precautions Precautions: Back Precaution Booklet Issued: Yes (comment) Recall of Precautions/Restrictions: Intact Precaution/Restrictions Comments: no brace Restrictions Weight Bearing Restrictions Per Provider Order: No       Mobility Bed Mobility Overal bed mobility: Needs Assistance Bed Mobility:  Sidelying to Sit   Sidelying to sit: Contact guard assist       General bed mobility comments: cues for log roll technique for spinal precautions    Transfers Overall transfer level: Needs assistance Equipment used: Rolling walker (2 wheels) Transfers: Sit to/from Stand Sit to Stand: Contact guard assist, Min assist           General transfer comment: cues for hand placements throughout session     Balance Overall balance assessment: Needs assistance Sitting-balance support: Bilateral upper extremity supported, Feet supported Sitting balance-Leahy Scale: Good     Standing balance support: Bilateral upper extremity supported, During functional activity Standing balance-Leahy Scale: Fair Standing balance comment: BUE support on RW                           ADL either performed or assessed with clinical judgement   ADL Overall ADL's : Needs assistance/impaired     Grooming: Standing;Wash/dry hands;Contact guard assist Grooming Details (indicate cue type and reason): sink level             Lower Body Dressing: Cueing for back precautions;Sit to/from stand;With adaptive equipment Lower Body Dressing Details (indicate cue type and reason): edu on LB AE (sock aide and reacher for IND in LB dressing) Toilet Transfer: Minimal assistance;Rolling walker (2 wheels);Regular Toilet;Grab bars Toilet Transfer Details (indicate cue type and reason): cues for hand placement Toileting- Clothing Manipulation and Hygiene: Sitting/lateral lean;Contact guard assist       Functional mobility during ADLs: Minimal assistance;Cueing for sequencing;Rolling walker (2 wheels) General ADL Comments: t/f bathroom for urine void, LB dressing session using AE with spinal precautions. States her daughter  will provide 24/7 at discharge     Communication Communication Communication: No apparent difficulties   Cognition Arousal: Alert Behavior During Therapy: [facility] for tasks  assessed/performed               OT - Cognition Comments: poor carryover of hand placement during functional transfers despite continued edu                 Following commands: Intact        Cueing   Cueing Techniques: Verbal cues, Tactile cues, Visual cues        General Comments MAP >65 during session, mild drainage through honeycomb dressing, wound vac intact start and end of session    Pertinent Vitals/ Pain       Pain Assessment Pain Assessment: 0-10 Pain Score: 1  Pain Descriptors / Indicators: Aching Pain Intervention(s): Limited activity within patient's tolerance, Monitored during session         Frequency  Min 2X/week        Progress Toward Goals  OT Goals(current goals can now be found in the care plan section)  Progress towards OT goals: Progressing toward goals  Acute Rehab OT Goals OT Goal Formulation: With patient Time For Goal Achievement: 12/01/23 Potential to Achieve Goals: Good ADL Goals Pt Will Perform Lower Body Dressing: with modified independence Pt Will Transfer to Toilet: with modified independence  Plan         AM-PAC OT 6 Clicks Daily Activity     Outcome Measure   Help from another person eating meals?: None Help from another person taking care of personal grooming?: None Help from another person toileting, which includes using toliet, bedpan, or urinal?: A Little Help from another person bathing (including washing, rinsing, drying)?: A Little Help from another person to put on and taking off regular upper body clothing?: None Help from another person to put on and taking off regular lower body clothing?: A Lot 6 Click Score: 20    End of Session Equipment Utilized During Treatment: Gait belt;Rolling walker (2 wheels)  OT Visit Diagnosis: Unsteadiness on feet (R26.81);History of falling (Z91.81);Pain;Muscle weakness (generalized) (M62.81)   Activity Tolerance Patient tolerated treatment well   Patient Left in  chair;with call bell/phone within reach (handoff to PTA)   Nurse Communication Mobility status        Time: 1610-9604 OT Time Calculation (min): 32 min  Charges: OT General Charges $OT Visit: 1 Visit OT Treatments $Self Care/Home Management : 23-37 mins  Pattijo Juste L. Lemonte Al, OTR/L  11/19/23, 11:34 AM

## 2023-11-19 NOTE — Progress Notes (Signed)
 Physical Therapy Treatment Patient Details Name: Julie Wall MRN: [id number] DOB: Jan 07, 1956 Today's Date: 11/19/2023   History of Present Illness Pt admitted to [facility] on 11/16/23 for elective L4-5 TLIF with L3-4 decompression and R L5-S1 lateral discectomy and foraminotomy by Dr. Mont Antis. Significant PMH includes: T2DM, HLD, neurogenic claudication, obesity, HTN, and hx of T11-12 dcompression and fusion (2024).    PT Comments  Pt finished with OT session upon arrival but ready for gait.  She stands with cga x 1 with min a x 1 for balance.  She is able to walk 25' x 2 with RW and cga/min a x 1 for balance.  Pt stated she does not feel like she is at baseline for gait or balance but does not want to consider SNF  stated family is available to help at home and she is educated to have +1 assist at all times and to have +2 get her up.down the 2 steps without rails into her home.  She voided understanding.  While pt would benefit from a rehab stay, she does not want to consider it and feels she has the support at home.  Will need HHPT/POT and other supports as available.  Will discuss with team.   If plan is discharge home, recommend the following: A little help with bathing/dressing/bathroom;Assistance with cooking/housework;Assist for transportation;Help with stairs or ramp for entrance   Can travel by private vehicle        Equipment Recommendations  Rolling walker (2 wheels)    Recommendations for Other Services       Precautions / Restrictions Precautions Precautions: Back Precaution Booklet Issued: Yes (comment) Recall of Precautions/Restrictions: Intact Precaution/Restrictions Comments: no brace Restrictions Weight Bearing Restrictions Per Provider Order: No     Mobility  Bed Mobility               General bed mobility comments: in chair before and after Patient Response: Cooperative  Transfers Overall transfer level: Needs assistance Equipment used: Rolling walker (2  wheels) Transfers: Sit to/from Stand Sit to Stand: Contact guard assist, Min assist                Ambulation/Gait Ambulation/Gait assistance: Min assist Gait Distance (Feet): 25 Feet Assistive device: Rolling walker (2 wheels) Gait Pattern/deviations: Step-through pattern, Decreased step length - right, Decreased step length - left, Leaning posteriorly Gait velocity: dec     General Gait Details: 25' x 2 with RW and cga./min a x 1 due to balance deficits   Stairs             Wheelchair Mobility     Tilt Bed Tilt Bed Patient Response: Cooperative  Modified Rankin (Stroke Patients Only)       Balance Overall balance assessment: Needs assistance Sitting-balance support: Bilateral upper extremity supported, Feet supported Sitting balance-Leahy Scale: Good     Standing balance support: Bilateral upper extremity supported, During functional activity Standing balance-Leahy Scale: Fair Standing balance comment: BUE support on RW and +1 assist at all times                            Communication Communication Communication: No apparent difficulties  Cognition Arousal: Alert Behavior During Therapy: WFL for tasks assessed/performed   PT - Cognitive impairments: No apparent impairments                         Following commands: Intact  Cueing Cueing Techniques: Verbal cues, Tactile cues, Visual cues  Exercises      General Comments        Pertinent Vitals/Pain Pain Assessment Pain Assessment: Faces Faces Pain Scale: Hurts a little bit Pain Location: incisional, itching at incision Pain Descriptors / Indicators: Aching Pain Intervention(s): Limited activity within patient's tolerance, Monitored during session, Repositioned    Home Living                          Prior Function            PT Goals (current goals can now be found in the care plan section) Progress towards PT goals: Progressing toward  goals    Frequency    BID      PT Plan      Co-evaluation              AM-PAC PT 6 Clicks Mobility   Outcome Measure  Help needed turning from your back to your side while in a flat bed without using bedrails?: A Little Help needed moving from lying on your back to sitting on the side of a flat bed without using bedrails?: A Lot Help needed moving to and from a bed to a chair (including a wheelchair)?: A Little Help needed standing up from a chair using your arms (e.g., wheelchair or bedside chair)?: A Little Help needed to walk in hospital room?: A Little Help needed climbing 3-5 steps with a railing? : Total 6 Click Score: 15    End of Session Equipment Utilized During Treatment: Gait belt Activity Tolerance: Patient tolerated treatment well Patient left: in chair;with chair alarm set;with call bell/phone within reach Nurse Communication: Mobility status PT Visit Diagnosis: Unsteadiness on feet (R26.81);Muscle weakness (generalized) (M62.81);History of falling (Z91.81);Pain     Time: 8295-6213 PT Time Calculation (min) (ACUTE ONLY): 11 min  Charges:    $Gait Training: 8-22 mins PT General Charges $$ ACUTE PT VISIT: 1 Visit                   Charlanne Cong, PTA 11/19/23, 9:56 AM

## 2023-11-19 NOTE — Care Management Important Message (Signed)
 Important Message  Patient Details  Name: Julie Wall MRN: [id number] Date of Birth: April 20, 1956   Important Message Given:  Yes - Medicare IM     [name], CMA 11/19/2023, 1:17 PM

## 2023-11-19 NOTE — Progress Notes (Signed)
 Neurosurgery Progress Note  History: Julie Wall is here for spondylolisthesis.  She underwent L4-5 transforaminal lumbar interbody fusion, right L5-S1 far lateral foraminotomy and discectomy, and L3-4 decompression.  POD3: let pain improved this morning  POD2: Some leg pain overnight POD1: She has expected back pain.  She is complaining of some numbness in her legs.  Physical Exam: Vitals:   11/18/23 1941 11/19/23 0259  BP: 103/60 (!) 100/55  Pulse: 92 95  Resp: 18 19  Temp: 99 F (37.2 C) 98.6 F (37 C)  SpO2: 95% 95%    AA Ox3 CNI  Strength:5/5 throughout bilateral lower extremities with exception of right iliopsoas and extension which are 4+ out of 5  Sensation appears equal in BLE   HV output 20 over the last 24 hrs  Data:  Other tests/results:   Drain output 67.5  Assessment/Plan:  Julie Wall is recovering from lumbar intervention.  She has history of thoracic myelopathy which clouds her sensory findings.  - mobilize - pain control  - DVT prophylaxis - PTOT; dispo planning pending final recommendations - Will remove drain this afternoon  Anastacio Karvonen PA-C Department of Neurosurgery

## 2023-11-19 NOTE — Discharge Summary (Signed)
 Discharge Summary  Patient ID: Julie Wall MRN: [id number] DOB/AGE: [birth year] 68 y.o.  Admit date: 11/16/2023 Discharge date: 11/19/2023  Admission Diagnoses:  M43.16 Spondylolisthesis of lumbar region, M54.42, M54.41, G89.29 Chronic bilateral low back pain with bilateral sciatica, M54.16 Lumbar radiculopathy, M48.062 Neurogenic claudication due to lumbar spinal stenosis   Discharge Diagnoses:  Principal Problem:   S/P lumbar fusion Active Problems:   Spondylolisthesis of lumbar region   Lumbar radiculopathy   Lumbar stenosis with neurogenic claudication   Discharged Condition: good  Hospital Course:  Julie Wall is a 68 y.o female presenting with symptomatic lumbar spondylolisthesis status post L4-5 TLIF, L3-4 decompression, right L5-S1 far lateral discectomy and foraminotomy. Her intraoperative course was uncomplicated.  She was admitted for drain output monitoring, pain control, and therapy evaluation.  Her postoperative course was complicated by some intermittent leg pain and numbness which improved throughout her hospitalization.  Her drain output decreased as expected and was removed on the afternoon of postop day 3.  She was seen evaluated therapy and deemed appropriate for skilled nursing however the patient declined and requested discharge home.  Home health PT and OT were set up. She was given prescriptions of Oxycodone , Robaxin , Celebrex, and Senna to take as needed.   Consults: None  Significant Diagnostic Studies: labs:     Latest Ref Rng & Units 11/18/2023    3:18 AM 11/17/2023    1:31 AM 08/10/2023   11:30 AM  CBC  WBC 4.0 - 10.5 K/uL 9.5  10.2  8.1   Hemoglobin 12.0 - 15.0 g/dL 82.9  56.2  13.0   Hematocrit 36.0 - 46.0 % 35.9  36.3  46.7   Platelets 150 - 400 K/uL 213  244  291      Treatments: surgery: as above. Please see separately dictated operative report for further details   Discharge Exam: Blood pressure (!) 98/50, pulse 82, temperature 98.2 F (36.8  C), temperature source Oral, resp. rate 16, height 5' 2 (1.575 m), weight 71.7 kg, SpO2 95%. A&O sitting in chair at bedside Strength:                                                                                                                          Side Iliopsoas Quads Hamstring PF DF EHL  R 4+ 4+ 5 5 5 5   L 5 5 5 5 5 5    Incisions covered with clean postop dressings   Disposition: Discharge disposition: 06-Home-Health Care Svc       Discharge Instructions     Incentive spirometry RT   Complete by: As directed    Remove dressing in 24 hours   Complete by: As directed       Allergies as of 11/19/2023       Reactions   Penicillins Rash        Medication List     STOP taking these medications    naproxen sodium 220 MG tablet Commonly known as: ALEVE  TAKE these medications    Accu-Chek Aviva Plus test strip Generic drug: glucose blood CHECK BLOOD SUGAR ONCE DAILY   acitretin 25 MG capsule Commonly known as: SORIATANE Take 25 mg by mouth daily.   albuterol  108 (90 Base) MCG/ACT inhaler Commonly known as: VENTOLIN  HFA Inhale 2 puffs into the lungs every 6 (six) hours as needed for wheezing or shortness of breath.   AquaLance Lancets 30G Misc CHECK BLOOD SUGAR 2 TIMES A DAY   aspirin  EC 81 MG tablet Take 81 mg by mouth daily.   atorvastatin  40 MG tablet Commonly known as: LIPITOR Take 1 tablet (40 mg total) by mouth daily.   B-12 1000 MCG Subl Place 1,000 mcg under the tongue daily.   Blood Glucose Monitoring Suppl Devi 1 each by Does not apply route in the morning, at noon, and at bedtime. May substitute to any manufacturer covered by patient's insurance.   celecoxib 200 MG capsule Commonly known as: CELEBREX Take 1 capsule (200 mg total) by mouth 2 (two) times daily.   chlorhexidine  4 % external liquid Commonly known as: HIBICLENS  Apply 15 mLs (1 Application total) topically as directed for 30 doses. Use as directed daily for 5  days every other week for 6 weeks.   empagliflozin  25 MG Tabs tablet Commonly known as: Jardiance  Take 1 tablet (25 mg total) by mouth daily.   gabapentin  300 MG capsule Commonly known as: NEURONTIN  Take 1 capsule (300 mg total) by mouth 2 (two) times daily.   halobetasol 0.05 % ointment Commonly known as: ULTRAVATE Apply 1 Application topically 2 (two) times daily as needed (severe eczema flares).   metFORMIN  750 MG 24 hr tablet Commonly known as: GLUCOPHAGE -XR TAKE 2 TABLETS (1,500 MG TOTAL) BY MOUTHDAILY WITH BREAKFAST   methocarbamol  500 MG tablet Commonly known as: ROBAXIN  Take 1 tablet (500 mg total) by mouth every 8 (eight) hours as needed for muscle spasms. What changed: Another medication with the same name was added. Make sure you understand how and when to take each.   methocarbamol  500 MG tablet Commonly known as: ROBAXIN  Take 1 tablet (500 mg total) by mouth every 6 (six) hours as needed for muscle spasms. What changed: You were already taking a medication with the same name, and this prescription was added. Make sure you understand how and when to take each.   mupirocin ointment 2 % Commonly known as: BACTROBAN Place 1 Application into the nose 2 (two) times daily for 60 doses. Use as directed 2 times daily for 5 days every other week for 6 weeks.   oxyCODONE  5 MG immediate release tablet Commonly known as: Oxy IR/ROXICODONE  Take 1 tablet (5 mg total) by mouth every 4 (four) hours as needed for moderate pain (pain score 4-6).   senna 8.6 MG Tabs tablet Commonly known as: SENOKOT Take 1 tablet (8.6 mg total) by mouth 2 (two) times daily as needed for mild constipation.   Trelegy Ellipta  200-62.5-25 MCG/ACT Aepb Generic drug: Fluticasone -Umeclidin-Vilant Inhale 1 puff into the lungs daily.   triamcinolone  cream 0.1 % Commonly known as: KENALOG  Apply 1 Application topically 2 (two) times daily.               Durable Medical Equipment  (From  admission, onward)           Start     Ordered   11/19/23 1220  For home use only DME Walker rolling  Once       Question Answer Comment  Walker:  With 5 Inch Wheels   Patient needs a walker to treat with the following condition S/P lumbar fusion      11/19/23 1219             Signed: Noble Bateman 11/19/2023, 12:27 PM

## 2023-11-21 DIAGNOSIS — M5416 Radiculopathy, lumbar region: Secondary | ICD-10-CM | POA: Diagnosis not present

## 2023-11-21 DIAGNOSIS — Z7984 Long term (current) use of oral hypoglycemic drugs: Secondary | ICD-10-CM | POA: Diagnosis not present

## 2023-11-21 DIAGNOSIS — Z791 Long term (current) use of non-steroidal anti-inflammatories (NSAID): Secondary | ICD-10-CM | POA: Diagnosis not present

## 2023-11-21 DIAGNOSIS — Z4789 Encounter for other orthopedic aftercare: Secondary | ICD-10-CM | POA: Diagnosis not present

## 2023-11-21 DIAGNOSIS — G8929 Other chronic pain: Secondary | ICD-10-CM | POA: Diagnosis not present

## 2023-11-21 DIAGNOSIS — M4316 Spondylolisthesis, lumbar region: Secondary | ICD-10-CM | POA: Diagnosis not present

## 2023-11-21 DIAGNOSIS — M48062 Spinal stenosis, lumbar region with neurogenic claudication: Secondary | ICD-10-CM | POA: Diagnosis not present

## 2023-11-23 ENCOUNTER — Other Ambulatory Visit: Payer: Self-pay | Admitting: Family Medicine

## 2023-11-23 ENCOUNTER — Encounter: Payer: Self-pay | Admitting: Neurosurgery

## 2023-11-23 DIAGNOSIS — M4316 Spondylolisthesis, lumbar region: Secondary | ICD-10-CM | POA: Diagnosis not present

## 2023-11-23 DIAGNOSIS — Z791 Long term (current) use of non-steroidal anti-inflammatories (NSAID): Secondary | ICD-10-CM | POA: Diagnosis not present

## 2023-11-23 DIAGNOSIS — M5416 Radiculopathy, lumbar region: Secondary | ICD-10-CM | POA: Diagnosis not present

## 2023-11-23 DIAGNOSIS — G8929 Other chronic pain: Secondary | ICD-10-CM | POA: Diagnosis not present

## 2023-11-23 DIAGNOSIS — M48062 Spinal stenosis, lumbar region with neurogenic claudication: Secondary | ICD-10-CM | POA: Diagnosis not present

## 2023-11-23 DIAGNOSIS — E1142 Type 2 diabetes mellitus with diabetic polyneuropathy: Secondary | ICD-10-CM

## 2023-11-23 DIAGNOSIS — Z7984 Long term (current) use of oral hypoglycemic drugs: Secondary | ICD-10-CM | POA: Diagnosis not present

## 2023-11-23 DIAGNOSIS — Z4789 Encounter for other orthopedic aftercare: Secondary | ICD-10-CM | POA: Diagnosis not present

## 2023-11-23 NOTE — Telephone Encounter (Unsigned)
 Copied from CRM [id number]. Topic: Clinical - Medication Refill >> Nov 23, 2023  2:00 PM Emylou G wrote: Medication: Blood Glucose Monitoring Suppl DEVI ACCU-CHEK AVIVA PLUS test strip   Has the patient contacted their pharmacy? No (Agent: If no, request that the patient contact the pharmacy for the refill. If patient does not wish to contact the pharmacy document the reason why and proceed with request.) (Agent: If yes, when and what did the pharmacy advise?)  This is the patient's preferred pharmacy:  [facility] - [place], [place] - [address] [place] [address] Phone: [phone] Fax: [phone]   Is this the correct pharmacy for this prescription? Yes If no, delete pharmacy and type the correct one.   Has the prescription been filled recently? No  Is the patient out of the medication? Yes  Has the patient been seen for an appointment in the last year OR does the patient have an upcoming appointment? Yes  Can we respond through MyChart? No  Agent: Please be advised that Rx refills may take up to 3 business days. We ask that you follow-up with your pharmacy.

## 2023-11-27 ENCOUNTER — Telehealth: Payer: Self-pay

## 2023-11-27 ENCOUNTER — Other Ambulatory Visit: Payer: Self-pay

## 2023-11-27 DIAGNOSIS — E1142 Type 2 diabetes mellitus with diabetic polyneuropathy: Secondary | ICD-10-CM

## 2023-11-27 MED ORDER — ACCU-CHEK AVIVA PLUS VI STRP: ORAL_STRIP | 2 refills | Status: DC

## 2023-11-27 NOTE — Telephone Encounter (Signed)
Rx updated and sent.

## 2023-11-27 NOTE — Telephone Encounter (Signed)
 Requested medication (s) are due for refill today:   Yes  Requested medication (s) are on the active medication list:   Yes from 2022  Future visit scheduled:   No   LOV 08/13/2023 with Dr. Glenard   Last ordered: 08/30/2020 #100, 2 refills  Needs new rx     Requested Prescriptions  Pending Prescriptions Disp Refills   glucose blood (ACCU-CHEK AVIVA PLUS) test strip 100 each 2    Sig: Use as instructed     Endocrinology: Diabetes - Testing Supplies Passed - 11/27/2023 10:20 AM      Passed - Valid encounter within last 12 months    Recent Outpatient Visits           3 months ago Neurogenic claudication   [facility] Glenard Mire, MD   3 months ago Acute right-sided thoracic back pain   [facility] Sowles, Krichna, MD

## 2023-11-27 NOTE — Telephone Encounter (Signed)
 Copied from CRM [id number]. Topic: Clinical - Prescription Issue >> Nov 27, 2023 12:46 PM Cristopher B wrote: Reason for CRM: [facility] pharmacy called in because they received a RX for glucose blood (ACCU-CHEK AVIVA PLUS) test strip and they need to know what the max strips are so they can properly dispense . They said if the Doctor needs to send a new RX they are more than welcome to do so

## 2023-11-28 ENCOUNTER — Ambulatory Visit: Admitting: Physician Assistant

## 2023-11-28 ENCOUNTER — Encounter: Payer: Self-pay | Admitting: Physician Assistant

## 2023-11-28 VITALS — BP 136/68 | Temp 98.3°F | Ht 62.0 in | Wt 158.0 lb

## 2023-11-28 DIAGNOSIS — M5416 Radiculopathy, lumbar region: Secondary | ICD-10-CM | POA: Diagnosis not present

## 2023-11-28 DIAGNOSIS — M4316 Spondylolisthesis, lumbar region: Secondary | ICD-10-CM

## 2023-11-28 DIAGNOSIS — Z981 Arthrodesis status: Secondary | ICD-10-CM

## 2023-11-28 DIAGNOSIS — M48062 Spinal stenosis, lumbar region with neurogenic claudication: Secondary | ICD-10-CM | POA: Diagnosis not present

## 2023-11-28 DIAGNOSIS — Z791 Long term (current) use of non-steroidal anti-inflammatories (NSAID): Secondary | ICD-10-CM | POA: Diagnosis not present

## 2023-11-28 DIAGNOSIS — Z7984 Long term (current) use of oral hypoglycemic drugs: Secondary | ICD-10-CM | POA: Diagnosis not present

## 2023-11-28 DIAGNOSIS — Z4789 Encounter for other orthopedic aftercare: Secondary | ICD-10-CM | POA: Diagnosis not present

## 2023-11-28 DIAGNOSIS — G8929 Other chronic pain: Secondary | ICD-10-CM | POA: Diagnosis not present

## 2023-11-28 NOTE — Progress Notes (Unsigned)
   REFERRING PHYSICIAN:  Sowles, Krichna, Md [address] [place],  [place] [address]  DOS: 11/16/23  HISTORY OF PRESENT ILLNESS: Julie Wall is approximately 2 weeks status post L4-5 TLIF, L3-4 decompression, R L5-S1 far lateral foraminotomy . she is doing well. ***  Sore and stiff, numbness has improved since surgery, but still present.dizziness with standing   Gaba, robax, oxy   PHYSICAL EXAMINATION:  General: Patient is well developed, well nourished, calm, collected, and in no apparent distress.   NEUROLOGICAL:  General: In no acute distress.   Awake, alert, oriented to person, place, and time.  Pupils equal round and reactive to light.  Facial tone is symmetric.     Strength:            Side Iliopsoas Quads Hamstring PF DF EHL  R 5 5 5 5 5 5   L 5 5 5 5 5 5    Incision c/d/i   ROS (Neurologic):  Negative except as noted above  IMAGING: ***  ASSESSMENT/PLAN:  Julie Wall is doing well approximately *** after ***. she will follow up ***  ***  I have advised the patient to lift up to 10 pounds until 6 weeks after surgery, then increase up to 25 pounds until 12 weeks after surgery.  After 12 weeks post-op, the patient advised to increase activity as tolerated.  Advised to contact the office if any questions or concerns arise.  Lyle Decamp PA-C Department of neurosurgery

## 2023-11-29 DIAGNOSIS — M4316 Spondylolisthesis, lumbar region: Secondary | ICD-10-CM | POA: Diagnosis not present

## 2023-11-29 DIAGNOSIS — Z4789 Encounter for other orthopedic aftercare: Secondary | ICD-10-CM | POA: Diagnosis not present

## 2023-11-29 DIAGNOSIS — Z791 Long term (current) use of non-steroidal anti-inflammatories (NSAID): Secondary | ICD-10-CM | POA: Diagnosis not present

## 2023-11-29 DIAGNOSIS — Z7984 Long term (current) use of oral hypoglycemic drugs: Secondary | ICD-10-CM | POA: Diagnosis not present

## 2023-11-29 DIAGNOSIS — M48062 Spinal stenosis, lumbar region with neurogenic claudication: Secondary | ICD-10-CM | POA: Diagnosis not present

## 2023-11-29 DIAGNOSIS — G8929 Other chronic pain: Secondary | ICD-10-CM | POA: Diagnosis not present

## 2023-11-29 DIAGNOSIS — M5416 Radiculopathy, lumbar region: Secondary | ICD-10-CM | POA: Diagnosis not present

## 2023-11-30 DIAGNOSIS — Z4789 Encounter for other orthopedic aftercare: Secondary | ICD-10-CM | POA: Diagnosis not present

## 2023-11-30 DIAGNOSIS — M5416 Radiculopathy, lumbar region: Secondary | ICD-10-CM | POA: Diagnosis not present

## 2023-11-30 DIAGNOSIS — M48062 Spinal stenosis, lumbar region with neurogenic claudication: Secondary | ICD-10-CM | POA: Diagnosis not present

## 2023-11-30 DIAGNOSIS — Z7984 Long term (current) use of oral hypoglycemic drugs: Secondary | ICD-10-CM | POA: Diagnosis not present

## 2023-11-30 DIAGNOSIS — M4316 Spondylolisthesis, lumbar region: Secondary | ICD-10-CM | POA: Diagnosis not present

## 2023-11-30 DIAGNOSIS — Z791 Long term (current) use of non-steroidal anti-inflammatories (NSAID): Secondary | ICD-10-CM | POA: Diagnosis not present

## 2023-11-30 DIAGNOSIS — G8929 Other chronic pain: Secondary | ICD-10-CM | POA: Diagnosis not present

## 2023-12-03 ENCOUNTER — Ambulatory Visit (INDEPENDENT_AMBULATORY_CARE_PROVIDER_SITE_OTHER): Admitting: Neurosurgery

## 2023-12-03 VITALS — Temp 98.3°F

## 2023-12-03 DIAGNOSIS — M4316 Spondylolisthesis, lumbar region: Secondary | ICD-10-CM

## 2023-12-03 DIAGNOSIS — T8131XA Disruption of external operation (surgical) wound, not elsewhere classified, initial encounter: Secondary | ICD-10-CM | POA: Diagnosis not present

## 2023-12-03 DIAGNOSIS — Z09 Encounter for follow-up examination after completed treatment for conditions other than malignant neoplasm: Secondary | ICD-10-CM

## 2023-12-03 MED ORDER — OXYCODONE HCL 5 MG PO TABS: 5.0000 mg | ORAL_TABLET | Freq: Four times a day (QID) | ORAL | 0 refills | Status: DC | PRN

## 2023-12-03 MED ORDER — CEPHALEXIN 500 MG PO CAPS: 500.0000 mg | ORAL_CAPSULE | Freq: Three times a day (TID) | ORAL | 0 refills | Status: DC

## 2023-12-03 NOTE — Progress Notes (Signed)
   REFERRING PHYSICIAN:  Sowles, Krichna, Md [address] [place],  [place] [address]  DOS: 11/16/23  HISTORY OF PRESENT ILLNESS: Julie Wall is approximately 3.5 weeks status post L4-5 TLIF, L3-4 decompression, R L5-S1 far lateral foraminotomy She presents today for incisional concerns. She has had about 2 days scant drainage from her incision without fever or chills.   PHYSICAL EXAMINATION:  General: Patient is well developed, well nourished, calm, collected, and in no apparent distress.   NEUROLOGICAL:  General: In no acute distress.   Awake, alert, oriented to person, place, and time.  Pupils equal round and reactive to light.  Facial tone is symmetric.    Incision:  Small amount of expressible purulent drainage from left incision        ROS (Neurologic):  Negative except as noted above  IMAGING: No new imaging prior to this encounter.  ASSESSMENT/PLAN:  Julie Wall presents today for follow up regarding incisional concerns.  She has had a drainage from her incision.  On exam I was able to express all of beats of purulent appearing drainage from the left incision.  Given her lack of systemic symptoms and overall appearance of her incisions, I recommended localized wound care.  I did obtain a culture swab that we will send off.  I started her on 500 mg of Keflex 3 times daily for 7 days.  He has taken this medication before and tolerated it well despite her history of penicillin allergy.  I have also given her a refill of her oxycodone  to take as needed.  I will see her back for a wound check on Monday of next week or sooner should she have any worsening symptoms.  We discussed medication side effects in detail.  Advised to contact the office if any questions or concerns arise.  Edsel Goods PA-C Department of neurosurgery

## 2023-12-04 ENCOUNTER — Telehealth: Payer: Self-pay

## 2023-12-04 DIAGNOSIS — Z4789 Encounter for other orthopedic aftercare: Secondary | ICD-10-CM | POA: Diagnosis not present

## 2023-12-04 DIAGNOSIS — Z7984 Long term (current) use of oral hypoglycemic drugs: Secondary | ICD-10-CM | POA: Diagnosis not present

## 2023-12-04 DIAGNOSIS — M4316 Spondylolisthesis, lumbar region: Secondary | ICD-10-CM | POA: Diagnosis not present

## 2023-12-04 DIAGNOSIS — M48062 Spinal stenosis, lumbar region with neurogenic claudication: Secondary | ICD-10-CM | POA: Diagnosis not present

## 2023-12-04 DIAGNOSIS — G8929 Other chronic pain: Secondary | ICD-10-CM | POA: Diagnosis not present

## 2023-12-04 DIAGNOSIS — Z791 Long term (current) use of non-steroidal anti-inflammatories (NSAID): Secondary | ICD-10-CM | POA: Diagnosis not present

## 2023-12-04 DIAGNOSIS — M5416 Radiculopathy, lumbar region: Secondary | ICD-10-CM | POA: Diagnosis not present

## 2023-12-04 NOTE — Telephone Encounter (Signed)
 Left message for pt to return call.  I would like to speak with her to find out if her and her daughter feel the drainage is worse. Would also like to know if she started the antibiotics and if she is having any fever and/or chills.

## 2023-12-04 NOTE — Telephone Encounter (Signed)
 I spoke with her daughter Levon (who was present at her appointment yesterday) and she agrees that it is worse than yesterday. The right side is red and the left side is white, like it may be pus.  They deny fever/chills  She is complaining of pain, but not any worse pain than yesterday.  She started the antibiotics this morning.  Levon will send a picture.  I advised her to keep doing what Danielle instructed them to do until we call them back.  [phone] Levon

## 2023-12-04 NOTE — Telephone Encounter (Signed)
 Julie Wall

## 2023-12-04 NOTE — Progress Notes (Signed)
 Submitted request for pickup on 12/03/23 around 11:30am. Confirmation listed below.

## 2023-12-04 NOTE — Telephone Encounter (Signed)
 Notified Edsel Goods, PA-C of fax from answering service.

## 2023-12-04 NOTE — Telephone Encounter (Signed)
 [facility] has not picked up the culture swab taken during the visit on 12/03/23. I called [facility] on 12/04/23 at 9am to report this. As of 6pm, it still has not been picked up. I called [facility] again about this at 6pm (spoke with [facility]). Both times, I have been assured that this has been escalated and someone will pick it up ASAP.  Confirmation # is [id number]

## 2023-12-05 ENCOUNTER — Ambulatory Visit
Admission: RE | Admit: 2023-12-05 | Discharge: 2023-12-05 | Disposition: A | Discharge status: Home / Self Care | Source: Ambulatory Visit | Attending: Neurosurgery | Admitting: Neurosurgery

## 2023-12-05 ENCOUNTER — Other Ambulatory Visit: Payer: Self-pay | Admitting: Neurosurgery

## 2023-12-05 DIAGNOSIS — M4316 Spondylolisthesis, lumbar region: Secondary | ICD-10-CM | POA: Diagnosis not present

## 2023-12-05 DIAGNOSIS — T81329D Deep disruption or dehiscence of operation wound, unspecified, subsequent encounter: Secondary | ICD-10-CM | POA: Diagnosis not present

## 2023-12-05 DIAGNOSIS — Z4789 Encounter for other orthopedic aftercare: Secondary | ICD-10-CM | POA: Diagnosis not present

## 2023-12-05 DIAGNOSIS — M48062 Spinal stenosis, lumbar region with neurogenic claudication: Secondary | ICD-10-CM | POA: Diagnosis not present

## 2023-12-05 DIAGNOSIS — M5127 Other intervertebral disc displacement, lumbosacral region: Secondary | ICD-10-CM | POA: Diagnosis not present

## 2023-12-05 DIAGNOSIS — Z791 Long term (current) use of non-steroidal anti-inflammatories (NSAID): Secondary | ICD-10-CM | POA: Diagnosis not present

## 2023-12-05 DIAGNOSIS — Z7984 Long term (current) use of oral hypoglycemic drugs: Secondary | ICD-10-CM | POA: Diagnosis not present

## 2023-12-05 DIAGNOSIS — M47816 Spondylosis without myelopathy or radiculopathy, lumbar region: Secondary | ICD-10-CM | POA: Diagnosis not present

## 2023-12-05 DIAGNOSIS — M47817 Spondylosis without myelopathy or radiculopathy, lumbosacral region: Secondary | ICD-10-CM | POA: Diagnosis not present

## 2023-12-05 DIAGNOSIS — M5416 Radiculopathy, lumbar region: Secondary | ICD-10-CM | POA: Diagnosis not present

## 2023-12-05 DIAGNOSIS — G8929 Other chronic pain: Secondary | ICD-10-CM | POA: Diagnosis not present

## 2023-12-05 NOTE — Telephone Encounter (Signed)
 Pt has scheduled for 12/18/23

## 2023-12-05 NOTE — Telephone Encounter (Signed)
 I spoke with Levon. They last changed the bandage yesterday evening. She is about to change the bandage shortly. She states she can see drainage through the bandage, and it isn't leaking out of the bandage, but also states there is a little spot on her shirt. She still denies fever/chills. They are going to send an updated picture.

## 2023-12-05 NOTE — Telephone Encounter (Signed)
 SABRA

## 2023-12-05 NOTE — Telephone Encounter (Signed)
 CT scan is scheduled for today, arrival at 4pm at [facility]. They will call Dr Claudene with the call report.  I notified pt's daughter Levon of the appointment. I advised to continue antibiotics and dressing changes with Medih[facility].  I checked the [facility] portal a few minutes ago. Culture results are still pending at this time.

## 2023-12-05 NOTE — Telephone Encounter (Signed)
 Copied from CRM [id number]. Topic: Clinical - Prescription Issue >> Dec 05, 2023  3:29 PM Montie POUR wrote: Reason for CRM:  Stephane is calling with an issue She needs new order for the following Accu-Chek Guide; Accu-Chek Strips and Accu- Chek Lancets Order needs to state what is the maximum test she can do per day.   Contact: Dawn  [phone] Created by: Tawni Montie ORN Comments: [facility] Thanks >> Dec 05, 2023  3:33 PM Montie POUR wrote: This replaces the Accu-Chek Aviva Plus

## 2023-12-05 NOTE — Telephone Encounter (Signed)
 I notified pt's daughter Levon that we would like to send Ms Coppedge for an urgent CT scan.

## 2023-12-05 NOTE — Telephone Encounter (Signed)
 Pt has been seen for only pre op exams or acutes visits. Pt needs to schedule an appt for regular follow up prior any refills. Can give patient enough supply until she is scheduled

## 2023-12-05 NOTE — Telephone Encounter (Signed)
 The culture has been picked up by [facility].

## 2023-12-06 NOTE — Telephone Encounter (Signed)
 Preliminary results from [facility] portal 12/06/23:

## 2023-12-08 LAB — ANAEROBIC AND AEROBIC CULTURE

## 2023-12-10 ENCOUNTER — Encounter: Payer: Self-pay | Admitting: Neurosurgery

## 2023-12-10 ENCOUNTER — Ambulatory Visit (INDEPENDENT_AMBULATORY_CARE_PROVIDER_SITE_OTHER): Admitting: Neurosurgery

## 2023-12-10 ENCOUNTER — Other Ambulatory Visit: Payer: Self-pay | Admitting: Neurosurgery

## 2023-12-10 ENCOUNTER — Telehealth: Payer: Self-pay

## 2023-12-10 VITALS — BP 130/68 | Temp 99.2°F | Ht 62.0 in | Wt 158.0 lb

## 2023-12-10 DIAGNOSIS — Z09 Encounter for follow-up examination after completed treatment for conditions other than malignant neoplasm: Secondary | ICD-10-CM

## 2023-12-10 DIAGNOSIS — T8131XD Disruption of external operation (surgical) wound, not elsewhere classified, subsequent encounter: Secondary | ICD-10-CM

## 2023-12-10 DIAGNOSIS — T81329D Deep disruption or dehiscence of operation wound, unspecified, subsequent encounter: Secondary | ICD-10-CM

## 2023-12-10 DIAGNOSIS — M4316 Spondylolisthesis, lumbar region: Secondary | ICD-10-CM

## 2023-12-10 MED ORDER — DOXYCYCLINE HYCLATE 100 MG PO CAPS: 100.0000 mg | ORAL_CAPSULE | Freq: Two times a day (BID) | ORAL | 0 refills | Status: AC

## 2023-12-10 NOTE — Telephone Encounter (Signed)
 I spoke with Julie Wall and relayed Danielle's recommendations. She verbalized understanding.

## 2023-12-10 NOTE — Progress Notes (Signed)
   REFERRING PHYSICIAN:  Sowles, Krichna, Md [address] [place],  [place] [address]  DOS: 11/16/23 L4-5 TLIF   HISTORY OF PRESENT ILLNESS: Julie Wall is approximately 3 weeks status post TLIF. She presents today for a wound check. She has still had some drainage from her incision but less than last week. She has one more pill of Keflex . She denies any fevers.   PHYSICAL EXAMINATION:  General: Patient is well developed, well nourished, calm, collected, and in no apparent distress.   NEUROLOGICAL:  General: In no acute distress.   Awake, alert, oriented to person, place, and time.  Pupils equal round and reactive to light.  Facial tone is symmetric.  Tongue protrusion is midline.  There is no pronator drift.   Strength: MAEW  Incision with some mild drainage. Less tender that last week    ROS (Neurologic):  Negative except as noted above  IMAGING: 12/05/23 CT Lumbar spine IMPRESSION: Postsurgical changes and degenerative changes of the lumbar spine as above. Hardware is intact. Postsurgical changes within the subcutaneous tissues without evidence of fluid collection or soft tissue gas.     Electronically Signed   By: Donnice Mania M.D.   On: 12/05/2023 16:57  Wound culture: MRSA. See report for further details  ASSESSMENT/PLAN:  Julie Wall is approximately 3 weeks after TLIF. Presenting today for a wound check. She continues to be without systemic symptoms. I reviewed her case with Dr. Fayette who recommended that we discontinue her Keflex  and start her on Doxycycline . I will see her back next week for another wound check.   Edsel Goods PA-C Department of neurosurgery

## 2023-12-10 NOTE — Progress Notes (Signed)
 Julie Wall

## 2023-12-10 NOTE — Telephone Encounter (Signed)
-----   Message from Edsel Jama Goods sent at 12/10/2023 12:29 PM EDT ----- Can you please let Julie Wall know that I talked with ID and they recommended she stop the Keflex  and start Doxy 100mg  BID x 7 days. Thank you

## 2023-12-10 NOTE — Progress Notes (Signed)
 Discussed patient with Dr. Lanelle who reviewed her labs and recommended 100mg  BID of Doxycyline and to discontinue Keflex .

## 2023-12-11 DIAGNOSIS — M48062 Spinal stenosis, lumbar region with neurogenic claudication: Secondary | ICD-10-CM | POA: Diagnosis not present

## 2023-12-11 DIAGNOSIS — M4316 Spondylolisthesis, lumbar region: Secondary | ICD-10-CM | POA: Diagnosis not present

## 2023-12-11 DIAGNOSIS — Z7984 Long term (current) use of oral hypoglycemic drugs: Secondary | ICD-10-CM | POA: Diagnosis not present

## 2023-12-11 DIAGNOSIS — M5416 Radiculopathy, lumbar region: Secondary | ICD-10-CM | POA: Diagnosis not present

## 2023-12-11 DIAGNOSIS — G8929 Other chronic pain: Secondary | ICD-10-CM | POA: Diagnosis not present

## 2023-12-11 DIAGNOSIS — Z4789 Encounter for other orthopedic aftercare: Secondary | ICD-10-CM | POA: Diagnosis not present

## 2023-12-11 DIAGNOSIS — Z791 Long term (current) use of non-steroidal anti-inflammatories (NSAID): Secondary | ICD-10-CM | POA: Diagnosis not present

## 2023-12-12 ENCOUNTER — Telehealth: Payer: Self-pay | Admitting: Neurosurgery

## 2023-12-12 DIAGNOSIS — Z791 Long term (current) use of non-steroidal anti-inflammatories (NSAID): Secondary | ICD-10-CM | POA: Diagnosis not present

## 2023-12-12 DIAGNOSIS — G8929 Other chronic pain: Secondary | ICD-10-CM | POA: Diagnosis not present

## 2023-12-12 DIAGNOSIS — M4316 Spondylolisthesis, lumbar region: Secondary | ICD-10-CM | POA: Diagnosis not present

## 2023-12-12 DIAGNOSIS — Z7984 Long term (current) use of oral hypoglycemic drugs: Secondary | ICD-10-CM | POA: Diagnosis not present

## 2023-12-12 DIAGNOSIS — M5416 Radiculopathy, lumbar region: Secondary | ICD-10-CM | POA: Diagnosis not present

## 2023-12-12 DIAGNOSIS — M48062 Spinal stenosis, lumbar region with neurogenic claudication: Secondary | ICD-10-CM | POA: Diagnosis not present

## 2023-12-12 DIAGNOSIS — Z4789 Encounter for other orthopedic aftercare: Secondary | ICD-10-CM | POA: Diagnosis not present

## 2023-12-12 NOTE — Telephone Encounter (Signed)
 I spoke with the patient. She states the fall was last week, after she tripped over something. She is agreeable to trying a back brace. I reiterated the importance of following her restrictions of no bending/twisting/not lifting more than 10 pounds. She states her incisions are doing better and the drainage is improving. I encouraged her to call us  if she needs anything.

## 2023-12-12 NOTE — Telephone Encounter (Signed)
 L4-5 transforaminal lumbar interbody fusion, Right L5-S1 far lateral foraminotomy, L3-4 decompression on 11/16/23   Kate from [facility] is calling to report that the patient had a fall in the bathroom on 12/03/2023. She states that the patient fell on her bottom but there are no injuries and no pain reported at today's visit. She also states that the patient is not being compliant with spinal precautions and may need a brace to help her from bending and twisting. Please advise.

## 2023-12-12 NOTE — Telephone Encounter (Signed)
 Faxed order for LSO brace to [facility].

## 2023-12-14 DIAGNOSIS — Z4789 Encounter for other orthopedic aftercare: Secondary | ICD-10-CM | POA: Diagnosis not present

## 2023-12-14 DIAGNOSIS — M5416 Radiculopathy, lumbar region: Secondary | ICD-10-CM | POA: Diagnosis not present

## 2023-12-14 DIAGNOSIS — M4316 Spondylolisthesis, lumbar region: Secondary | ICD-10-CM | POA: Diagnosis not present

## 2023-12-14 DIAGNOSIS — G8929 Other chronic pain: Secondary | ICD-10-CM | POA: Diagnosis not present

## 2023-12-14 DIAGNOSIS — Z7984 Long term (current) use of oral hypoglycemic drugs: Secondary | ICD-10-CM | POA: Diagnosis not present

## 2023-12-14 DIAGNOSIS — M48062 Spinal stenosis, lumbar region with neurogenic claudication: Secondary | ICD-10-CM | POA: Diagnosis not present

## 2023-12-14 DIAGNOSIS — Z791 Long term (current) use of non-steroidal anti-inflammatories (NSAID): Secondary | ICD-10-CM | POA: Diagnosis not present

## 2023-12-17 DIAGNOSIS — Z4789 Encounter for other orthopedic aftercare: Secondary | ICD-10-CM | POA: Diagnosis not present

## 2023-12-17 DIAGNOSIS — M48062 Spinal stenosis, lumbar region with neurogenic claudication: Secondary | ICD-10-CM | POA: Diagnosis not present

## 2023-12-17 DIAGNOSIS — M4316 Spondylolisthesis, lumbar region: Secondary | ICD-10-CM | POA: Diagnosis not present

## 2023-12-17 DIAGNOSIS — M5416 Radiculopathy, lumbar region: Secondary | ICD-10-CM | POA: Diagnosis not present

## 2023-12-17 DIAGNOSIS — Z791 Long term (current) use of non-steroidal anti-inflammatories (NSAID): Secondary | ICD-10-CM | POA: Diagnosis not present

## 2023-12-17 DIAGNOSIS — Z7984 Long term (current) use of oral hypoglycemic drugs: Secondary | ICD-10-CM | POA: Diagnosis not present

## 2023-12-17 DIAGNOSIS — G8929 Other chronic pain: Secondary | ICD-10-CM | POA: Diagnosis not present

## 2023-12-18 ENCOUNTER — Ambulatory Visit: Admitting: Family Medicine

## 2023-12-18 ENCOUNTER — Encounter: Payer: Self-pay | Admitting: Family Medicine

## 2023-12-18 VITALS — BP 114/70 | HR 93 | Resp 16 | Ht 62.0 in | Wt 163.7 lb

## 2023-12-18 DIAGNOSIS — Z9889 Other specified postprocedural states: Secondary | ICD-10-CM | POA: Diagnosis not present

## 2023-12-18 DIAGNOSIS — Z7984 Long term (current) use of oral hypoglycemic drugs: Secondary | ICD-10-CM | POA: Diagnosis not present

## 2023-12-18 DIAGNOSIS — J4489 Other specified chronic obstructive pulmonary disease: Secondary | ICD-10-CM

## 2023-12-18 DIAGNOSIS — E1169 Type 2 diabetes mellitus with other specified complication: Secondary | ICD-10-CM | POA: Diagnosis not present

## 2023-12-18 DIAGNOSIS — J432 Centrilobular emphysema: Secondary | ICD-10-CM

## 2023-12-18 DIAGNOSIS — E1142 Type 2 diabetes mellitus with diabetic polyneuropathy: Secondary | ICD-10-CM

## 2023-12-18 DIAGNOSIS — E785 Hyperlipidemia, unspecified: Secondary | ICD-10-CM | POA: Diagnosis not present

## 2023-12-18 DIAGNOSIS — J849 Interstitial pulmonary disease, unspecified: Secondary | ICD-10-CM | POA: Diagnosis not present

## 2023-12-18 DIAGNOSIS — R350 Frequency of micturition: Secondary | ICD-10-CM | POA: Diagnosis not present

## 2023-12-18 DIAGNOSIS — E538 Deficiency of other specified B group vitamins: Secondary | ICD-10-CM

## 2023-12-18 DIAGNOSIS — E1129 Type 2 diabetes mellitus with other diabetic kidney complication: Secondary | ICD-10-CM

## 2023-12-18 DIAGNOSIS — I700 Atherosclerosis of aorta: Secondary | ICD-10-CM

## 2023-12-18 LAB — POCT URINALYSIS DIPSTICK
Bilirubin, UA: NEGATIVE
Glucose, UA: POSITIVE — AB
Ketones, UA: NEGATIVE
Nitrite, UA: NEGATIVE
Protein, UA: NEGATIVE
Spec Grav, UA: 1.015 (ref 1.010–1.025)
Urobilinogen, UA: 0.2 U/dL
pH, UA: 5 (ref 5.0–8.0)

## 2023-12-18 MED ORDER — LANCET DEVICE MISC: 1.0000 | Freq: Every day | 0 refills | Status: AC | PRN

## 2023-12-18 MED ORDER — METFORMIN HCL ER 750 MG PO TB24: ORAL_TABLET | ORAL | 0 refills | Status: DC

## 2023-12-18 MED ORDER — EMPAGLIFLOZIN 25 MG PO TABS: 25.0000 mg | ORAL_TABLET | Freq: Every day | ORAL | 1 refills | Status: DC

## 2023-12-18 MED ORDER — LANCETS MISC. MISC: 1.0000 | Freq: Three times a day (TID) | 0 refills | Status: AC

## 2023-12-18 MED ORDER — BLOOD GLUCOSE MONITORING SUPPL DEVI: 1.0000 | Freq: Three times a day (TID) | 0 refills | Status: AC

## 2023-12-18 MED ORDER — ATORVASTATIN CALCIUM 40 MG PO TABS: 40.0000 mg | ORAL_TABLET | Freq: Every day | ORAL | 1 refills | Status: DC

## 2023-12-18 NOTE — Progress Notes (Signed)
 Name: Julie Wall   MRN: [id number]    DOB: 07-17-1955   Date:12/18/2023       Progress Note  Subjective  Chief Complaint  Chief Complaint  Patient presents with   Medical Management of Chronic Issues   Discussed the use of AI scribe software for clinical note transcription with the patient, who gave verbal consent to proceed.  History of Present Illness Julie Wall is a 68 year old female who presents for a regular follow-up after back surgery.  She underwent spinal lumbar fusion surgery on November 16, 2023, due to spondylolisthesis and chronic bilateral low back pain with bilateral sciatica. Post-surgery, she experienced a wound infection with purulent drainage, treated with antibiotics including Keflex  but after culture results switched to doxycycline . She uses a walker and, when asked, confirmed that numbness in her legs has improved since surgery. She receives physical therapy at home.  She has  diabetes, previously uncontrolled, but improved to an A1c of 6.6% prior to surgery. She is currently taking metformin  twice daily and Jardiance  daily. She reports increased urinary frequency and incontinence, urinating every 5 to 10 minutes, which is worse than before. No fever or chills. She has not been checking glucose at home. Diabetes is associated with dyslipidemia, microalbuminuria  and diabetic neuropathy   She has a history of emphysema , interstitial lund disease and asthma with COPD overlap syndrome. She quit smoking on August 23, 2023, and reports improved breathing and reduced coughing since quitting. She continues to use Trelegy and albuterol  as prescribed. She is due for follow up with Pulmonologist   She has a history of atherosclerosis of Aorta and hypercholesterolemia, for which she takes atorvastatin  daily. No chest pain or palpitations or myopathy     Patient Active Problem List   Diagnosis Date Noted   S/P lumbar fusion 11/16/2023   Spondylolisthesis of lumbar region  11/16/2023   Lumbar radiculopathy 11/16/2023   Lumbar stenosis with neurogenic claudication 11/16/2023   Thoracic spondylosis with myelopathy 10/02/2022   Spinal stenosis of thoracic region 10/02/2022   S/P spinal fusion 10/02/2022   B12 deficiency 09/27/2021   Hypertension associated with diabetes (HCC) 09/27/2021   Diabetic polyneuropathy associated with type 2 diabetes mellitus (HCC)    Hilar lymphadenopathy 02/05/2020   History of COVID-19 02/05/2020   Atherosclerosis of aorta (HCC) 05/04/2017   Pap smear abnormality of cervix with ASCUS favoring benign 04/23/2017   Vitamin D  deficiency 12/14/2016   Obesity (BMI 30.0-34.9) 12/13/2015   Chronic bronchitis (HCC) 05/14/2015   Positive H. pylori test 04/05/2015   Rapid urease test for Helicobacter pylori infection positive 04/05/2015   Umbilical hernia 04/01/2015   Exomphalos 04/01/2015   RLS (restless legs syndrome) 01/08/2015   Type 2 diabetes mellitus with hyperlipidemia (HCC) 01/08/2015   Neurogenic claudication 01/08/2015   AD (atopic dermatitis) 12/28/2014   Chronic low back pain 12/28/2014   Dyslipidemia 12/28/2014   Gastroesophageal reflux disease without esophagitis 12/28/2014   Microalbuminuria 12/28/2014   Disorder of bone and cartilage 12/28/2014   Neuralgia of left thigh 12/28/2014   Perennial allergic rhinitis with seasonal variation 12/28/2014   Tobacco abuse 12/28/2014   Mononeuropathy of left lower extremity 12/28/2014   Hypertension, benign 09/22/2005    Past Surgical History:  Procedure Laterality Date   APPLICATION OF INTRAOPERATIVE CT SCAN N/A 10/02/2022   Procedure: APPLICATION OF INTRAOPERATIVE CT SCAN;  Surgeon: Clois Fret, MD;  Location: [facility] ORS;  Service: Neurosurgery;  Laterality: N/A;   APPLICATION OF INTRAOPERATIVE CT SCAN N/A  11/16/2023   Procedure: APPLICATION OF INTRAOPERATIVE CT SCAN;  Surgeon: Clois Fret, MD;  Location: [facility] ORS;  Service: Neurosurgery;  Laterality: N/A;    CATARACT EXTRACTION Bilateral 06/05/1978   CESAREAN SECTION     x 2   CORNEAL TRANSPLANT Left    [facility] for blindness.   EYE SURGERY     FAR LATERAL DECOMPRESSION 1 LEVEL Right 11/16/2023   Procedure: FAR LATERAL DECOMPRESSION 1 LEVEL;  Surgeon: Clois Fret, MD;  Location: [facility] ORS;  Service: Neurosurgery;  Laterality: Right;  RIGHT L5-S1 FAR LATERAL FORAMINOTOMY, L3-4 DECOMPRESSION   TRANSFORAMINAL LUMBAR INTERBODY FUSION W/ MIS 1 LEVEL N/A 11/16/2023   Procedure: L4-5 MINIMALLY INVASIVE (MIS) TRANSFORAMINAL LUMBAR INTERBODY FUSION (TLIF);  Surgeon: Clois Fret, MD;  Location: [facility] ORS;  Service: Neurosurgery;  Laterality: N/A;  L4-5 TLIF    Family History  Problem Relation Age of Onset   Diabetes Mother    Hypertension Mother    Pancreatic cancer Mother    Alcohol abuse Father    Arthritis/Rheumatoid Sister    Heart failure Sister    Diabetes Sister    Kidney disease Sister    Diabetes Brother    Breast cancer Neg Hx     Social History   Tobacco Use   Smoking status: Former    Current packs/day: 1.00    Average packs/day: 1 pack/day for 47.2 years (47.2 ttl pk-yrs)    Types: Cigarettes    Start date: 01/07/1985   Smokeless tobacco: Never   Tobacco comments:    Per patient she quit 08/23/23  Substance Use Topics   Alcohol use: No    Alcohol/week: 0.0 standard drinks of alcohol     Current Outpatient Medications:    acitretin  (SORIATANE ) 25 MG capsule, Take 25 mg by mouth daily., Disp: , Rfl:    albuterol  (VENTOLIN  HFA) 108 (90 Base) MCG/ACT inhaler, Inhale 2 puffs into the lungs every 6 (six) hours as needed for wheezing or shortness of breath., Disp: , Rfl:    AquaLance Lancets 30G MISC, CHECK BLOOD SUGAR 2 TIMES A DAY, Disp: 200 each, Rfl: 3   aspirin  EC 81 MG tablet, Take 81 mg by mouth daily., Disp: , Rfl:    atorvastatin  (LIPITOR) 40 MG tablet, Take 1 tablet (40 mg total) by mouth daily., Disp: 90 tablet, Rfl: 1   Blood Glucose Monitoring Suppl  DEVI, 1 each by Does not apply route in the morning, at noon, and at bedtime. May substitute to any manufacturer covered by patient's insurance., Disp: 1 each, Rfl: 0   celecoxib  (CELEBREX ) 200 MG capsule, Take 1 capsule (200 mg total) by mouth 2 (two) times daily., Disp: 60 capsule, Rfl: 0   chlorhexidine  (HIBICLENS ) 4 % external liquid, Apply 15 mLs (1 Application total) topically as directed for 30 doses. Use as directed daily for 5 days every other week for 6 weeks., Disp: 946 mL, Rfl: 1   Cyanocobalamin  (B-12) 1000 MCG SUBL, Place 1,000 mcg under the tongue daily., Disp: , Rfl:    doxycycline  (VIBRA -TABS) 100 MG tablet, Take 100 mg by mouth 2 (two) times daily., Disp: , Rfl:    empagliflozin  (JARDIANCE ) 25 MG TABS tablet, Take 1 tablet (25 mg total) by mouth daily., Disp: 90 tablet, Rfl: 1   Fluticasone -Umeclidin-Vilant (TRELEGY ELLIPTA ) 200-62.5-25 MCG/ACT AEPB, Inhale 1 puff into the lungs daily., Disp: 28 each, Rfl: 11   gabapentin  (NEURONTIN ) 300 MG capsule, Take 1 capsule (300 mg total) by mouth 2 (two) times daily., Disp: 60 capsule,  Rfl: 1   glucose blood (ACCU-CHEK AVIVA PLUS) test strip, Check glucose once daily, Disp: 100 each, Rfl: 2   halobetasol (ULTRAVATE) 0.05 % ointment, Apply 1 Application topically 2 (two) times daily as needed (severe eczema flares)., Disp: , Rfl:    metFORMIN  (GLUCOPHAGE -XR) 750 MG 24 hr tablet, TAKE 2 TABLETS (1,500 MG TOTAL) BY MOUTHDAILY WITH BREAKFAST, Disp: 180 tablet, Rfl: 0   methocarbamol  (ROBAXIN ) 500 MG tablet, Take 1 tablet (500 mg total) by mouth every 8 (eight) hours as needed for muscle spasms., Disp: 90 tablet, Rfl: 0   methocarbamol  (ROBAXIN ) 500 MG tablet, Take 1 tablet (500 mg total) by mouth every 6 (six) hours as needed for muscle spasms., Disp: 120 tablet, Rfl: 0   mupirocin  ointment (BACTROBAN ) 2 %, Place 1 Application into the nose 2 (two) times daily for 60 doses. Use as directed 2 times daily for 5 days every other week for 6 weeks.,  Disp: 60 g, Rfl: 0   oxyCODONE  (OXY IR/ROXICODONE ) 5 MG immediate release tablet, Take 1 tablet (5 mg total) by mouth every 6 (six) hours as needed for severe pain (pain score 7-10)., Disp: 30 tablet, Rfl: 0   triamcinolone  cream (KENALOG ) 0.1 %, Apply 1 Application topically 2 (two) times daily., Disp: , Rfl:    senna (SENOKOT) 8.6 MG TABS tablet, Take 1 tablet (8.6 mg total) by mouth 2 (two) times daily as needed for mild constipation. (Patient not taking: Reported on 12/18/2023), Disp: 30 tablet, Rfl: 0  Allergies  Allergen Reactions   Penicillin G    Penicillins Rash    I personally reviewed active problem list, medication list, allergies with the patient/caregiver today.   ROS  Ten systems reviewed and is negative except as mentioned in HPI    Objective Physical Exam CONSTITUTIONAL: Patient appears well-developed and well-nourished. No distress. HEENT: Head atraumatic, normocephalic, neck supple. CARDIOVASCULAR: Normal rate, regular rhythm and normal heart sounds. No murmur heard. No edema in lower extremities. PULMONARY: Effort normal and breath sounds normal. Lungs clear to auscultation. No respiratory distress. ABDOMINAL: There is no tenderness or distention. MUSCULOSKELETAL: Normal gait. Without gross motor or sensory deficit. PSYCHIATRIC: Patient has a normal mood and affect. Behavior is normal. Judgment and thought content normal.  Vitals:   12/18/23 1000  BP: 114/70  Pulse: 93  Resp: 16  SpO2: 99%  Weight: 163 lb 11.2 oz (74.3 kg)  Height: 5' 2 (1.575 m)    Body mass index is 29.94 kg/m.  Recent Results (from the past 2160 hours)  Nicotine /cotinine metabolites     Status: None   Collection Time: 10/23/23 10:59 AM  Result Value Ref Range   Nicotine  <1.0 ng/mL    Comment: (NOTE) This test was developed and its performance characteristics determined by [facility]. It has not been cleared or approved by the Food and Drug Administration. Nicotine  levels greater  than 2.0 are consistent with the use of tobacco or tobacco cessation products.    Cotinine <1.0 ng/mL    Comment: (NOTE) This test was developed and its performance characteristics determined by [facility]. It has not been cleared or approved by the Food and Drug Administration. Cotinine levels greater than 20.0 are consistent with the use of tobacco or tobacco cessation products. Performed At: [facility] [address] [place], [place] [id number] Jennette Shorter MD Ey:[phone]   HgB A1c     Status: Abnormal   Collection Time: 10/23/23 10:59 AM  Result Value Ref Range   Hgb A1c MFr Bld  6.6 (H) 4.8 - 5.6 %    Comment: (NOTE) Pre diabetes:          5.7%-6.4%  Diabetes:              >6.4%  Glycemic control for   <7.0% adults with diabetes    Mean Plasma Glucose 142.72 mg/dL    Comment: Performed at [facility], [address], [place], [place] [address]  Type and screen [facility]     Status: None   Collection Time: 11/05/23 10:07 AM  Result Value Ref Range   ABO/RH(D) B POS    Antibody Screen NEG    Sample Expiration 11/19/2023,2359    Extend sample reason      NO TRANSFUSIONS OR PREGNANCY IN THE PAST 3 MONTHS Performed at [facility], [address] [place], [place] [address]   Urinalysis, Routine w reflex microscopic -Urine, Clean Catch     Status: Abnormal   Collection Time: 11/05/23 10:07 AM  Result Value Ref Range   Color, Urine YELLOW YELLOW   APPearance CLEAR CLEAR   Specific Gravity, Urine 1.015 1.005 - 1.030   pH 5.0 5.0 - 8.0   Glucose, UA >1,000 (A) NEGATIVE mg/dL   Hgb urine dipstick NEGATIVE NEGATIVE   Bilirubin Urine NEGATIVE NEGATIVE   Ketones, ur TRACE (A) NEGATIVE mg/dL   Protein, ur NEGATIVE NEGATIVE mg/dL   Nitrite NEGATIVE NEGATIVE   Leukocytes,Ua NEGATIVE NEGATIVE    Comment: Microscopic not done on urines with negative protein, blood, leukocytes, nitrite, or glucose < 500 mg/dL. Performed at  [facility], [address] [place], [place] [address]   Surgical pcr screen     Status: Abnormal   Collection Time: 11/05/23 10:07 AM   Specimen: Nasal Mucosa; Nasal Swab  Result Value Ref Range   MRSA, PCR POSITIVE (A) NEGATIVE    Comment: RESULT CALLED TO, READ BACK BY AND VERIFIED WITH: HEATHER DAVIS [id number] 11/05/23 MU    Staphylococcus aureus POSITIVE (A) NEGATIVE    Comment: (NOTE) The Xpert SA Assay (FDA approved for NASAL specimens in patients 27 years of age and older), is one component of a comprehensive surveillance program. It is not intended to diagnose infection nor to guide or monitor treatment. Performed at [facility], [address] [place], [place] [address]   Basic metabolic panel     Status: Abnormal   Collection Time: 11/05/23 10:07 AM  Result Value Ref Range   Sodium 143 135 - 145 mmol/L   Potassium 4.2 3.5 - 5.1 mmol/L   Chloride 112 (H) 98 - 111 mmol/L   CO2 21 (L) 22 - 32 mmol/L   Glucose, Bld 109 (H) 70 - 99 mg/dL    Comment: Glucose reference range applies only to samples taken after fasting for at least 8 hours.   BUN 20 8 - 23 mg/dL   Creatinine, Ser 9.43 0.44 - 1.00 mg/dL   Calcium  9.3 8.9 - 10.3 mg/dL   GFR, Estimated >39 >39 mL/min    Comment: (NOTE) Calculated using the CKD-EPI Creatinine Equation (2021)    Anion gap 10 5 - 15    Comment: Performed at [facility], [address] [place], [place] [address]  Glucose, capillary     Status: Abnormal   Collection Time: 11/16/23 12:15 PM  Result Value Ref Range   Glucose-Capillary 123 (H) 70 - 99 mg/dL    Comment: Glucose reference range applies only to samples taken after fasting  for at least 8 hours.  Glucose, capillary     Status: Abnormal   Collection Time: 11/16/23  5:11 PM  Result Value Ref Range   Glucose-Capillary 117 (H) 70 - 99 mg/dL    Comment: Glucose reference range applies only to samples taken after fasting for at least 8 hours.  Glucose,  capillary     Status: Abnormal   Collection Time: 11/16/23 10:10 PM  Result Value Ref Range   Glucose-Capillary 162 (H) 70 - 99 mg/dL    Comment: Glucose reference range applies only to samples taken after fasting for at least 8 hours.  CBC     Status: None   Collection Time: 11/17/23  1:31 AM  Result Value Ref Range   WBC 10.2 4.0 - 10.5 K/uL   RBC 3.94 3.87 - 5.11 MIL/uL   Hemoglobin 12.3 12.0 - 15.0 g/dL   HCT 63.6 63.9 - 53.9 %   MCV 92.1 80.0 - 100.0 fL   MCH 31.2 26.0 - 34.0 pg   MCHC 33.9 30.0 - 36.0 g/dL   RDW 87.4 88.4 - 84.4 %   Platelets 244 150 - 400 K/uL   nRBC 0.0 0.0 - 0.2 %    Comment: Performed at [facility], [address] [place], [place] [address]  Glucose, capillary     Status: None   Collection Time: 11/17/23  7:48 AM  Result Value Ref Range   Glucose-Capillary 84 70 - 99 mg/dL    Comment: Glucose reference range applies only to samples taken after fasting for at least 8 hours.  Glucose, capillary     Status: Abnormal   Collection Time: 11/17/23 11:36 AM  Result Value Ref Range   Glucose-Capillary 131 (H) 70 - 99 mg/dL    Comment: Glucose reference range applies only to samples taken after fasting for at least 8 hours.  Glucose, capillary     Status: Abnormal   Collection Time: 11/17/23  4:30 PM  Result Value Ref Range   Glucose-Capillary 135 (H) 70 - 99 mg/dL    Comment: Glucose reference range applies only to samples taken after fasting for at least 8 hours.  Glucose, capillary     Status: Abnormal   Collection Time: 11/17/23  7:55 PM  Result Value Ref Range   Glucose-Capillary 141 (H) 70 - 99 mg/dL    Comment: Glucose reference range applies only to samples taken after fasting for at least 8 hours.  Glucose, capillary     Status: Abnormal   Collection Time: 11/17/23  9:03 PM  Result Value Ref Range   Glucose-Capillary 173 (H) 70 - 99 mg/dL    Comment: Glucose reference range applies only to samples taken after fasting for at least 8  hours.  CBC     Status: Abnormal   Collection Time: 11/18/23  3:18 AM  Result Value Ref Range   WBC 9.5 4.0 - 10.5 K/uL   RBC 3.87 3.87 - 5.11 MIL/uL   Hemoglobin 12.0 12.0 - 15.0 g/dL   HCT 64.0 (L) 63.9 - 53.9 %   MCV 92.8 80.0 - 100.0 fL   MCH 31.0 26.0 - 34.0 pg   MCHC 33.4 30.0 - 36.0 g/dL   RDW 87.1 88.4 - 84.4 %   Platelets 213 150 - 400 K/uL   nRBC 0.0 0.0 - 0.2 %    Comment: Performed at [facility], [address] [place], [place] [address]  Glucose, capillary     Status: Abnormal  Collection Time: 11/18/23  8:16 AM  Result Value Ref Range   Glucose-Capillary 102 (H) 70 - 99 mg/dL    Comment: Glucose reference range applies only to samples taken after fasting for at least 8 hours.  Glucose, capillary     Status: Abnormal   Collection Time: 11/18/23  1:59 PM  Result Value Ref Range   Glucose-Capillary 137 (H) 70 - 99 mg/dL    Comment: Glucose reference range applies only to samples taken after fasting for at least 8 hours.  Glucose, capillary     Status: Abnormal   Collection Time: 11/18/23  5:09 PM  Result Value Ref Range   Glucose-Capillary 100 (H) 70 - 99 mg/dL    Comment: Glucose reference range applies only to samples taken after fasting for at least 8 hours.  Glucose, capillary     Status: Abnormal   Collection Time: 11/18/23  8:39 PM  Result Value Ref Range   Glucose-Capillary 147 (H) 70 - 99 mg/dL    Comment: Glucose reference range applies only to samples taken after fasting for at least 8 hours.  Glucose, capillary     Status: Abnormal   Collection Time: 11/19/23  8:54 AM  Result Value Ref Range   Glucose-Capillary 171 (H) 70 - 99 mg/dL    Comment: Glucose reference range applies only to samples taken after fasting for at least 8 hours.  Glucose, capillary     Status: Abnormal   Collection Time: 11/19/23 11:53 AM  Result Value Ref Range   Glucose-Capillary 101 (H) 70 - 99 mg/dL    Comment: Glucose reference range applies only to samples  taken after fasting for at least 8 hours.  Anaerobic and Aerobic Culture     Status: Abnormal   Collection Time: 12/03/23 11:15 AM   Specimen: Wound   Wound Incision left lo  Result Value Ref Range   Anaerobic Culture Final report    Result 1 Comment     Comment: No anaerobic growth in 72 hours.   Aerobic Culture Final report (A)    Result 1 Comment (A)     Comment: Methicillin - resistant Staphylococcus aureus Based on resistance to oxacillin this isolate would be resistant to all currently available beta-lactam antimicrobial agents, with the exception of the newer cephalosporins with anti-MRSA activity, such as Ceftaroline Moderate growth    Antimicrobial Susceptibility Comment     Comment:       ** S = Susceptible; I = Intermediate; R = Resistant **                    P = Positive; N = Negative             MICS are expressed in micrograms per mL    Antibiotic                 RSLT#1    RSLT#2    RSLT#3    RSLT#4 Ciprofloxacin                   I Clindamycin                    S Erythromycin                   R Gentamicin                     S Levofloxacin   I Linezolid                      S Oxacillin                      R Penicillin                     R Rifampin                       S Tetracycline                   S Trimethoprim /Sulfa              R Vancomycin                      S     Diabetic Foot Exam:     PHQ2/9:    12/18/2023    9:54 AM 07/24/2023    9:42 AM 07/02/2023    1:01 PM 09/18/2022    3:23 PM 07/20/2022    9:21 AM  Depression screen PHQ 2/9  Decreased Interest 0 0 0 0 0  Down, Depressed, Hopeless 0 0 0 0 0  PHQ - 2 Score 0 0 0 0 0  Altered sleeping  0 0 0 0  Tired, decreased energy  0 0 0 0  Change in appetite  0 0 0 0  Feeling bad or failure about yourself   0 0 0 0  Trouble concentrating  0 0 0 0  Moving slowly or fidgety/restless  0 0 0 0  Suicidal thoughts  0 0 0 0  PHQ-9 Score  0 0 0 0  Difficult doing work/chores  Not  difficult at all Not difficult at all  Not difficult at all    phq 9 is negative  Fall Risk:    12/18/2023    9:54 AM 07/24/2023    9:43 AM 07/02/2023    1:01 PM 09/18/2022    3:23 PM 07/20/2022    9:18 AM  Fall Risk   Falls in the past year? 0 0 0 0 0  Number falls in past yr: 0 1 0 0 0  Injury with Fall? 0 0 0 0 0  Risk for fall due to : No Fall Risks History of fall(s);Impaired mobility;Impaired balance/gait No Fall Risks No Fall Risks No Fall Risks  Follow up Falls evaluation completed Falls prevention discussed;Falls evaluation completed Falls prevention discussed;Education provided;Falls evaluation completed Falls prevention discussed Education provided;Falls prevention discussed      Assessment & Plan Status post lumbar spine surgery with recent superficial wound infection Superficial wound infection post-surgery treated with antibiotics. Recent imaging showed no deep infection. Wound healing without drainage. - Continue doxycycline  as prescribed. - Follow up with Dr. Clois on December 20, 2023.  Chronic low back pain with bilateral sciatica (lumbar radiculopathy) Chronic low back pain with bilateral sciatica improved post-surgery and physical therapy. - Continue physical therapy at home. - Use walker as needed for mobility.  Type 2 diabetes mellitus with diabetic neuropathy, dyslipidemia and microalbuminuria  Type 2 diabetes with diabetic neuropathy. A1c improved to 6.6%. Reports increased urinary frequency. Continues on metformin  and Jardiance . B12 supplementation recommended. - Continue metformin  and Jardiance . - Prescribe B12 supplementation. - Order urine culture to evaluate urinary frequency. - Provide glucose meter with strips and lancets for daily monitoring.  Urinary frequency and incontinence, evaluation ongoing Increased urinary  frequency and incontinence, possibly related to diabetes or post-surgical changes. - Order urine culture to evaluate for infection. -  Monitor urinary symptoms and report any changes.  Asthma-COPD overlap syndrome with interstitial lung disease and emphysema Asthma-COPD overlap syndrome with interstitial lung disease and emphysema. Symptoms improved since quitting smoking. Continues on Trelegy and albuterol . - Continue Trelegy and albuterol . - Contact Dr. Tamea for follow-up appointment. - Provide Dr. Evalyn contact information to schedule appointment.  Atherosclerosis of aorta  Atherosclerosis of aorta and thoracic region. Continues on atorvastatin . - Continue atorvastatin  as prescribed.  Hypercholesterolemia (dyslipidemia) Hypercholesterolemia managed with atorvastatin . Cholesterol levels at goal. - Continue atorvastatin  as prescribed.

## 2023-12-18 NOTE — Patient Instructions (Signed)
 Team Member Role and Visual merchandiser Info Address Start End Comments  Tamea Dedra CROME, MD Consulting Physician (Pulmonary Disease) Phone: [phone] Fax: [phone] [address] [place], [place] [address] [place] [address] 12/18/2023 - -

## 2023-12-20 ENCOUNTER — Encounter: Payer: Self-pay | Admitting: Neurosurgery

## 2023-12-20 ENCOUNTER — Ambulatory Visit (INDEPENDENT_AMBULATORY_CARE_PROVIDER_SITE_OTHER): Admitting: Neurosurgery

## 2023-12-20 VITALS — BP 122/70 | Temp 98.2°F | Ht 62.0 in | Wt 163.0 lb

## 2023-12-20 DIAGNOSIS — T81329D Deep disruption or dehiscence of operation wound, unspecified, subsequent encounter: Secondary | ICD-10-CM

## 2023-12-20 DIAGNOSIS — T8131XD Disruption of external operation (surgical) wound, not elsewhere classified, subsequent encounter: Secondary | ICD-10-CM

## 2023-12-20 DIAGNOSIS — Z09 Encounter for follow-up examination after completed treatment for conditions other than malignant neoplasm: Secondary | ICD-10-CM

## 2023-12-20 LAB — CULTURE, URINE COMPREHENSIVE
MICRO NUMBER:: 16702448
RESULT:: NO GROWTH
SPECIMEN QUALITY:: ADEQUATE

## 2023-12-20 NOTE — Progress Notes (Signed)
   REFERRING PHYSICIAN:  Sowles, Krichna, Md [address] [place],  [place] [address]  DOS: 11/16/23 L4-5 TLIF   HISTORY OF PRESENT ILLNESS: 12/20/23 Julie Wall presents today for wound check. She has finished her antibiotics and denies any drainage or fevers.    Julie Wall is approximately 3 weeks status post TLIF. She presents today for a wound check. She has still had some drainage from her incision but less than last week. She has one more pill of Keflex . She denies any fevers.   PHYSICAL EXAMINATION:  General: Patient is well developed, well nourished, calm, collected, and in no apparent distress.   NEUROLOGICAL:  General: In no acute distress.   Awake, alert, oriented to person, place, and time.  Pupils equal round and reactive to light.   Strength: MAEW  Incision with some mild drainage. Less tender that last week    ROS (Neurologic):  Negative except as noted above  IMAGING: 12/05/23 CT Lumbar spine IMPRESSION: Postsurgical changes and degenerative changes of the lumbar spine as above. Hardware is intact. Postsurgical changes within the subcutaneous tissues without evidence of fluid collection or soft tissue gas.     Electronically Signed   By: Donnice Mania M.D.   On: 12/05/2023 16:57  Wound culture: MRSA. See report for further details  ASSESSMENT/PLAN:  Julie Wall is approximately 4 weeks after TLIF. Presenting today for a wound check. She continues to be without systemic symptoms. She has completed antibiotic treatment and is not having drainage from her incision.  I recommended continued localized wound care Medihoney and daily dressing changes.  She will follow-up with Dr. Erven for follow-up.  She was encouraged to call the office in the interim with any questions or concerns.  Edsel Goods PA-C Department of neurosurgery

## 2023-12-21 ENCOUNTER — Ambulatory Visit: Payer: Self-pay | Admitting: Family Medicine

## 2023-12-24 ENCOUNTER — Other Ambulatory Visit: Payer: Self-pay

## 2023-12-24 DIAGNOSIS — Z7984 Long term (current) use of oral hypoglycemic drugs: Secondary | ICD-10-CM | POA: Diagnosis not present

## 2023-12-24 DIAGNOSIS — M4316 Spondylolisthesis, lumbar region: Secondary | ICD-10-CM

## 2023-12-24 DIAGNOSIS — M5416 Radiculopathy, lumbar region: Secondary | ICD-10-CM | POA: Diagnosis not present

## 2023-12-24 DIAGNOSIS — G8929 Other chronic pain: Secondary | ICD-10-CM | POA: Diagnosis not present

## 2023-12-24 DIAGNOSIS — Z791 Long term (current) use of non-steroidal anti-inflammatories (NSAID): Secondary | ICD-10-CM | POA: Diagnosis not present

## 2023-12-24 DIAGNOSIS — Z4789 Encounter for other orthopedic aftercare: Secondary | ICD-10-CM | POA: Diagnosis not present

## 2023-12-24 DIAGNOSIS — M48062 Spinal stenosis, lumbar region with neurogenic claudication: Secondary | ICD-10-CM | POA: Diagnosis not present

## 2023-12-26 ENCOUNTER — Telehealth: Payer: Self-pay | Admitting: Neurosurgery

## 2023-12-26 DIAGNOSIS — M4316 Spondylolisthesis, lumbar region: Secondary | ICD-10-CM | POA: Diagnosis not present

## 2023-12-26 DIAGNOSIS — G8929 Other chronic pain: Secondary | ICD-10-CM | POA: Diagnosis not present

## 2023-12-26 DIAGNOSIS — Z4789 Encounter for other orthopedic aftercare: Secondary | ICD-10-CM | POA: Diagnosis not present

## 2023-12-26 DIAGNOSIS — Z791 Long term (current) use of non-steroidal anti-inflammatories (NSAID): Secondary | ICD-10-CM | POA: Diagnosis not present

## 2023-12-26 DIAGNOSIS — M5416 Radiculopathy, lumbar region: Secondary | ICD-10-CM | POA: Diagnosis not present

## 2023-12-26 DIAGNOSIS — Z7984 Long term (current) use of oral hypoglycemic drugs: Secondary | ICD-10-CM | POA: Diagnosis not present

## 2023-12-26 DIAGNOSIS — M48062 Spinal stenosis, lumbar region with neurogenic claudication: Secondary | ICD-10-CM | POA: Diagnosis not present

## 2023-12-26 NOTE — Telephone Encounter (Signed)
 Jennifer from [facility] called to report that the patient's wound is scabbing and there is no open wound. She is requesting to continue to follow the patient's healing for one week and then discharge if Dr. Clois is okay with this plan. She is requesting a call back tomorrow after the patient's appointment.

## 2023-12-27 ENCOUNTER — Ambulatory Visit
Admission: RE | Admit: 2023-12-27 | Discharge: 2023-12-27 | Disposition: A | Discharge status: Home / Self Care | Attending: Neurosurgery | Admitting: Neurosurgery

## 2023-12-27 ENCOUNTER — Encounter: Payer: Self-pay | Admitting: Neurosurgery

## 2023-12-27 ENCOUNTER — Ambulatory Visit (INDEPENDENT_AMBULATORY_CARE_PROVIDER_SITE_OTHER): Admitting: Neurosurgery

## 2023-12-27 ENCOUNTER — Ambulatory Visit
Admission: RE | Admit: 2023-12-27 | Discharge: 2023-12-27 | Disposition: A | Discharge status: Home / Self Care | Source: Ambulatory Visit | Attending: Neurosurgery | Admitting: Neurosurgery

## 2023-12-27 VITALS — BP 132/74 | Temp 98.5°F | Ht 62.0 in | Wt 163.0 lb

## 2023-12-27 DIAGNOSIS — Z981 Arthrodesis status: Secondary | ICD-10-CM | POA: Diagnosis not present

## 2023-12-27 DIAGNOSIS — M4316 Spondylolisthesis, lumbar region: Secondary | ICD-10-CM

## 2023-12-27 DIAGNOSIS — Z09 Encounter for follow-up examination after completed treatment for conditions other than malignant neoplasm: Secondary | ICD-10-CM

## 2023-12-27 DIAGNOSIS — M5416 Radiculopathy, lumbar region: Secondary | ICD-10-CM | POA: Insufficient documentation

## 2023-12-27 NOTE — Telephone Encounter (Signed)
 Jennifer notified and expressed understanding.

## 2023-12-27 NOTE — Progress Notes (Signed)
   REFERRING PHYSICIAN:  Sowles, Krichna, Md [address] [place],  [place] [address]  DOS: 11/16/23 L4-5 TLIF   HISTORY OF PRESENT ILLNESS: 12/20/23 Julie Wall presents today for follow-up status post TLIF.  She is working with physical therapy.  She is doing better.   PHYSICAL EXAMINATION:  General: Patient is well developed, well nourished, calm, collected, and in no apparent distress.   NEUROLOGICAL:  General: In no acute distress.   Awake, alert, oriented to person, place, and time.  Pupils equal round and reactive to light.   Strength: MAEW  Incision without erythema.  Mild scabbing.    ROS (Neurologic):  Negative except as noted above  IMAGING: 12/05/23 CT Lumbar spine IMPRESSION: Postsurgical changes and degenerative changes of the lumbar spine as above. Hardware is intact. Postsurgical changes within the subcutaneous tissues without evidence of fluid collection or soft tissue gas.     Electronically Signed   By: Donnice Mania M.D.   On: 12/05/2023 16:57  Wound culture: MRSA. See report for further details  ASSESSMENT/PLAN:  Julie Wall is s/p TLIF.  Her incision is healing.  I am pleased with her improvements.  We reviewed her activity limitations.  She will continue working with therapy.  I will extend her handicap placard.  We will see her back in 6 weeks.    Reeves Daisy MD Department of neurosurgery

## 2023-12-28 DIAGNOSIS — M4316 Spondylolisthesis, lumbar region: Secondary | ICD-10-CM | POA: Diagnosis not present

## 2023-12-28 DIAGNOSIS — Z7984 Long term (current) use of oral hypoglycemic drugs: Secondary | ICD-10-CM | POA: Diagnosis not present

## 2023-12-28 DIAGNOSIS — G8929 Other chronic pain: Secondary | ICD-10-CM | POA: Diagnosis not present

## 2023-12-28 DIAGNOSIS — Z4789 Encounter for other orthopedic aftercare: Secondary | ICD-10-CM | POA: Diagnosis not present

## 2023-12-28 DIAGNOSIS — Z791 Long term (current) use of non-steroidal anti-inflammatories (NSAID): Secondary | ICD-10-CM | POA: Diagnosis not present

## 2023-12-28 DIAGNOSIS — M48062 Spinal stenosis, lumbar region with neurogenic claudication: Secondary | ICD-10-CM | POA: Diagnosis not present

## 2023-12-28 DIAGNOSIS — M5416 Radiculopathy, lumbar region: Secondary | ICD-10-CM | POA: Diagnosis not present

## 2024-01-01 DIAGNOSIS — Z4789 Encounter for other orthopedic aftercare: Secondary | ICD-10-CM | POA: Diagnosis not present

## 2024-01-01 DIAGNOSIS — Z791 Long term (current) use of non-steroidal anti-inflammatories (NSAID): Secondary | ICD-10-CM | POA: Diagnosis not present

## 2024-01-01 DIAGNOSIS — M48062 Spinal stenosis, lumbar region with neurogenic claudication: Secondary | ICD-10-CM | POA: Diagnosis not present

## 2024-01-01 DIAGNOSIS — G8929 Other chronic pain: Secondary | ICD-10-CM | POA: Diagnosis not present

## 2024-01-01 DIAGNOSIS — Z7984 Long term (current) use of oral hypoglycemic drugs: Secondary | ICD-10-CM | POA: Diagnosis not present

## 2024-01-01 DIAGNOSIS — M5416 Radiculopathy, lumbar region: Secondary | ICD-10-CM | POA: Diagnosis not present

## 2024-01-01 DIAGNOSIS — M4316 Spondylolisthesis, lumbar region: Secondary | ICD-10-CM | POA: Diagnosis not present

## 2024-01-11 DIAGNOSIS — Z4789 Encounter for other orthopedic aftercare: Secondary | ICD-10-CM | POA: Diagnosis not present

## 2024-01-11 DIAGNOSIS — Z791 Long term (current) use of non-steroidal anti-inflammatories (NSAID): Secondary | ICD-10-CM | POA: Diagnosis not present

## 2024-01-11 DIAGNOSIS — G8929 Other chronic pain: Secondary | ICD-10-CM | POA: Diagnosis not present

## 2024-01-11 DIAGNOSIS — M48062 Spinal stenosis, lumbar region with neurogenic claudication: Secondary | ICD-10-CM | POA: Diagnosis not present

## 2024-01-11 DIAGNOSIS — Z7984 Long term (current) use of oral hypoglycemic drugs: Secondary | ICD-10-CM | POA: Diagnosis not present

## 2024-01-11 DIAGNOSIS — M4316 Spondylolisthesis, lumbar region: Secondary | ICD-10-CM | POA: Diagnosis not present

## 2024-01-11 DIAGNOSIS — M5416 Radiculopathy, lumbar region: Secondary | ICD-10-CM | POA: Diagnosis not present

## 2024-01-15 DIAGNOSIS — M48062 Spinal stenosis, lumbar region with neurogenic claudication: Secondary | ICD-10-CM | POA: Diagnosis not present

## 2024-01-15 DIAGNOSIS — Z791 Long term (current) use of non-steroidal anti-inflammatories (NSAID): Secondary | ICD-10-CM | POA: Diagnosis not present

## 2024-01-15 DIAGNOSIS — Z7984 Long term (current) use of oral hypoglycemic drugs: Secondary | ICD-10-CM | POA: Diagnosis not present

## 2024-01-15 DIAGNOSIS — M4316 Spondylolisthesis, lumbar region: Secondary | ICD-10-CM | POA: Diagnosis not present

## 2024-01-15 DIAGNOSIS — M5416 Radiculopathy, lumbar region: Secondary | ICD-10-CM | POA: Diagnosis not present

## 2024-01-15 DIAGNOSIS — G8929 Other chronic pain: Secondary | ICD-10-CM | POA: Diagnosis not present

## 2024-01-15 DIAGNOSIS — Z4789 Encounter for other orthopedic aftercare: Secondary | ICD-10-CM | POA: Diagnosis not present

## 2024-01-21 DIAGNOSIS — Z961 Presence of intraocular lens: Secondary | ICD-10-CM | POA: Diagnosis not present

## 2024-01-21 DIAGNOSIS — Z9889 Other specified postprocedural states: Secondary | ICD-10-CM | POA: Diagnosis not present

## 2024-01-21 DIAGNOSIS — H2702 Aphakia, left eye: Secondary | ICD-10-CM | POA: Diagnosis not present

## 2024-01-21 DIAGNOSIS — H40003 Preglaucoma, unspecified, bilateral: Secondary | ICD-10-CM | POA: Diagnosis not present

## 2024-01-28 DIAGNOSIS — H2702 Aphakia, left eye: Secondary | ICD-10-CM | POA: Diagnosis not present

## 2024-01-28 DIAGNOSIS — H1830 Unspecified corneal membrane change: Secondary | ICD-10-CM | POA: Diagnosis not present

## 2024-01-28 DIAGNOSIS — Z9889 Other specified postprocedural states: Secondary | ICD-10-CM | POA: Diagnosis not present

## 2024-01-28 DIAGNOSIS — H40003 Preglaucoma, unspecified, bilateral: Secondary | ICD-10-CM | POA: Diagnosis not present

## 2024-01-31 DIAGNOSIS — L309 Dermatitis, unspecified: Secondary | ICD-10-CM | POA: Diagnosis not present

## 2024-01-31 DIAGNOSIS — Z79899 Other long term (current) drug therapy: Secondary | ICD-10-CM | POA: Diagnosis not present

## 2024-02-05 ENCOUNTER — Other Ambulatory Visit: Payer: Self-pay

## 2024-02-05 DIAGNOSIS — M5416 Radiculopathy, lumbar region: Secondary | ICD-10-CM

## 2024-02-05 DIAGNOSIS — M4316 Spondylolisthesis, lumbar region: Secondary | ICD-10-CM

## 2024-02-06 NOTE — Progress Notes (Unsigned)
   REFERRING PHYSICIAN:  Sowles, Krichna, Md [address] [place],  [place] [address]  DOS: 11/16/23 L4-5 TLIF   HISTORY OF PRESENT ILLNESS: 02/06/24 Ms. Ebersole is about 3 months s/p lumbar fusion. She still has numbness in her feet and stiffness in her back particularly when she first gets up but is overall doing well. She is no longer taking Celebrex  or Oxycodone .  12/20/23 Ms. Swinford presents today for follow-up status post TLIF.  She is working with physical therapy.  She is doing better.   PHYSICAL EXAMINATION:  General: Patient is well developed, well nourished, calm, collected, and in no apparent distress.   NEUROLOGICAL:  General: In no acute distress.   Awake, alert, oriented to person, place, and time.  Pupils equal round and reactive to light.   Strength: MAEW  Incisions well healed  ROS (Neurologic):  Negative except as noted above  IMAGING: 02/06/24 lumbar xrays  Without evidence of hardware malfunction  12/27/23 lumbar xrays  FINDINGS: For posterior fusion changes noted at T11-12 and L4-5. No hardware complicating feature. Normal alignment. No fracture. SI joints symmetric and unremarkable.   IMPRESSION: Posterior fusion changes at T11-12 (remote) and L4-5 (new). No hardware complicating feature.     Electronically Signed   By: Franky Crease M.D.   On: 01/02/2024 20:38  12/05/23 CT Lumbar spine IMPRESSION: Postsurgical changes and degenerative changes of the lumbar spine as above. Hardware is intact. Postsurgical changes within the subcutaneous tissues without evidence of fluid collection or soft tissue gas.     Electronically Signed   By: Donnice Mania M.D.   On: 12/05/2023 16:57  Wound culture: MRSA. See report for further details  ASSESSMENT/PLAN:  Julie Wall is s/p TLIF.  She is largely doing well. She can resume activities as tolerated. I gave her a hand out for back exercises. We briefly discussed formal outpatient physical therapy  however she declined at this time.  We will reevaluate the need for this at her follow-up appointment or sooner should she decide she would like to go. She will follow up with Dr. Clois in 3 months or sooner should she have any questions or concerns. She expressed understanding and was in agreement with this plan.   Edsel Goods PA-C Department of neurosurgery

## 2024-02-07 ENCOUNTER — Ambulatory Visit
Admission: RE | Admit: 2024-02-07 | Discharge: 2024-02-07 | Disposition: A | Discharge status: Home / Self Care | Source: Ambulatory Visit | Attending: Neurosurgery | Admitting: Neurosurgery

## 2024-02-07 ENCOUNTER — Encounter: Payer: Self-pay | Admitting: Neurosurgery

## 2024-02-07 ENCOUNTER — Ambulatory Visit (INDEPENDENT_AMBULATORY_CARE_PROVIDER_SITE_OTHER): Admitting: Neurosurgery

## 2024-02-07 ENCOUNTER — Ambulatory Visit
Admission: RE | Admit: 2024-02-07 | Discharge: 2024-02-07 | Disposition: A | Discharge status: Home / Self Care | Attending: Neurosurgery | Admitting: Neurosurgery

## 2024-02-07 VITALS — BP 126/64 | Temp 98.2°F | Ht 62.0 in | Wt 164.2 lb

## 2024-02-07 DIAGNOSIS — M5416 Radiculopathy, lumbar region: Secondary | ICD-10-CM

## 2024-02-07 DIAGNOSIS — Z9889 Other specified postprocedural states: Secondary | ICD-10-CM | POA: Diagnosis not present

## 2024-02-07 DIAGNOSIS — M4316 Spondylolisthesis, lumbar region: Secondary | ICD-10-CM | POA: Insufficient documentation

## 2024-02-07 DIAGNOSIS — H2702 Aphakia, left eye: Secondary | ICD-10-CM | POA: Diagnosis not present

## 2024-02-07 DIAGNOSIS — H40003 Preglaucoma, unspecified, bilateral: Secondary | ICD-10-CM | POA: Diagnosis not present

## 2024-02-07 DIAGNOSIS — Z981 Arthrodesis status: Secondary | ICD-10-CM

## 2024-02-07 DIAGNOSIS — H1830 Unspecified corneal membrane change: Secondary | ICD-10-CM | POA: Diagnosis not present

## 2024-02-07 LAB — OPHTHALMOLOGY REPORT-SCANNED

## 2024-03-13 DIAGNOSIS — L2084 Intrinsic (allergic) eczema: Secondary | ICD-10-CM | POA: Diagnosis not present

## 2024-03-13 DIAGNOSIS — Z79899 Other long term (current) drug therapy: Secondary | ICD-10-CM | POA: Diagnosis not present

## 2024-03-13 DIAGNOSIS — Z1159 Encounter for screening for other viral diseases: Secondary | ICD-10-CM | POA: Diagnosis not present

## 2024-03-13 DIAGNOSIS — L309 Dermatitis, unspecified: Secondary | ICD-10-CM | POA: Diagnosis not present

## 2024-03-19 ENCOUNTER — Ambulatory Visit: Admitting: Family Medicine

## 2024-03-19 ENCOUNTER — Encounter: Payer: Self-pay | Admitting: Family Medicine

## 2024-03-19 VITALS — BP 114/70 | HR 95 | Resp 16 | Ht 62.0 in | Wt 165.0 lb

## 2024-03-19 DIAGNOSIS — I700 Atherosclerosis of aorta: Secondary | ICD-10-CM | POA: Diagnosis not present

## 2024-03-19 DIAGNOSIS — Z7984 Long term (current) use of oral hypoglycemic drugs: Secondary | ICD-10-CM | POA: Diagnosis not present

## 2024-03-19 DIAGNOSIS — J4489 Other specified chronic obstructive pulmonary disease: Secondary | ICD-10-CM

## 2024-03-19 DIAGNOSIS — E1142 Type 2 diabetes mellitus with diabetic polyneuropathy: Secondary | ICD-10-CM

## 2024-03-19 DIAGNOSIS — E2839 Other primary ovarian failure: Secondary | ICD-10-CM

## 2024-03-19 DIAGNOSIS — Z1211 Encounter for screening for malignant neoplasm of colon: Secondary | ICD-10-CM

## 2024-03-19 DIAGNOSIS — Z9889 Other specified postprocedural states: Secondary | ICD-10-CM | POA: Diagnosis not present

## 2024-03-19 DIAGNOSIS — J849 Interstitial pulmonary disease, unspecified: Secondary | ICD-10-CM

## 2024-03-19 DIAGNOSIS — Z23 Encounter for immunization: Secondary | ICD-10-CM

## 2024-03-19 DIAGNOSIS — Z1231 Encounter for screening mammogram for malignant neoplasm of breast: Secondary | ICD-10-CM

## 2024-03-19 DIAGNOSIS — R350 Frequency of micturition: Secondary | ICD-10-CM | POA: Diagnosis not present

## 2024-03-19 LAB — POCT URINALYSIS DIPSTICK
Bilirubin, UA: NEGATIVE
Glucose, UA: POSITIVE — AB
Ketones, UA: NEGATIVE
Nitrite, UA: NEGATIVE
Protein, UA: NEGATIVE
Spec Grav, UA: 1.02 (ref 1.010–1.025)
Urobilinogen, UA: 0.2 U/dL
pH, UA: 5 (ref 5.0–8.0)

## 2024-03-19 LAB — POCT GLYCOSYLATED HEMOGLOBIN (HGB A1C): Hemoglobin A1C: 8.9 % — AB (ref 4.0–5.6)

## 2024-03-19 MED ORDER — METFORMIN HCL ER 750 MG PO TB24: ORAL_TABLET | ORAL | 0 refills | Status: AC

## 2024-03-19 NOTE — Progress Notes (Signed)
 Name: Julie Wall   MRN: [id number]    DOB: 08-11-1955   Date:03/19/2024       Progress Note  Subjective  Chief Complaint  Chief Complaint  Patient presents with   Medical Management of Chronic Issues   Discussed the use of AI scribe software for clinical note transcription with the patient, who gave verbal consent to proceed.  History of Present Illness Julie Wall is a 68 year old female with spondylolisthesis and chronic bilateral low back pain who presents with balance issues and numbness in her feet post-surgery.  She underwent back surgery on November 16, 2023, for spondylolisthesis and chronic bilateral low back pain with bilateral sciatica. Post-surgery, she initially used a walker and then transitioned to a cane. She now experiences balance issues and numbness at the bottom of her feet, although the pain in her legs has subsided.  She has a history of severe eczema and was recently seen by a dermatologist. She was prescribed Soriatane , clobetasol  ointment, and Acitretin  . She reports that the blisters on her arm have not improved since using the topical medication.. She states two of the oral medications were not at the pharmacy  Her diabetes management has been inconsistent post-surgery. Her A1c increased from 6.6 in May to 8.9 recently. She has dietary lapses, including consuming bread and potatoes, and forgets to take her metformin  or Jardiance  regularly. She experiences frequent urination, which she attributes to her elevated blood sugar levels.  She has a history of asthma-COPD overlap syndrome and interstitial lung disease. She has not seen her pulmonologist in over a year and is concerned about running out of her Trelegy medication. No current shortness of breath or wheezing.  She quit smoking prior to her surgery and has a history of hyperlipidemia, for which she takes atorvastatin . She also takes gabapentin  and Robaxin  for her back pain.   Patient Active Problem List    Diagnosis Date Noted   S/P lumbar fusion 11/16/2023   Asthma-COPD overlap syndrome (HCC) 10/09/2022   Thoracic spondylosis with myelopathy 10/02/2022   Spinal stenosis of thoracic region 10/02/2022   S/P spinal fusion 10/02/2022   B12 deficiency 09/27/2021   Hypertension associated with diabetes (HCC) 09/27/2021   Diabetic polyneuropathy associated with type 2 diabetes mellitus (HCC)    Hilar lymphadenopathy 02/05/2020   History of COVID-19 02/05/2020   Atherosclerosis of aorta 05/04/2017   Pap smear abnormality of cervix with ASCUS favoring benign 04/23/2017   Vitamin D  deficiency 12/14/2016   Obesity (BMI 30.0-34.9) 12/13/2015   Positive H. pylori test 04/05/2015   Umbilical hernia 04/01/2015   Exomphalos 04/01/2015   RLS (restless legs syndrome) 01/08/2015   Type 2 diabetes mellitus with hyperlipidemia (HCC) 01/08/2015   Neurogenic claudication 01/08/2015   AD (atopic dermatitis) 12/28/2014   Chronic low back pain 12/28/2014   Dyslipidemia 12/28/2014   Gastroesophageal reflux disease without esophagitis 12/28/2014   Microalbuminuria 12/28/2014   Disorder of bone and cartilage 12/28/2014   Perennial allergic rhinitis with seasonal variation 12/28/2014   Mononeuropathy of left lower extremity 12/28/2014   Hypertension, benign 09/22/2005    Past Surgical History:  Procedure Laterality Date   APPLICATION OF INTRAOPERATIVE CT SCAN N/A 10/02/2022   Procedure: APPLICATION OF INTRAOPERATIVE CT SCAN;  Surgeon: Clois Fret, MD;  Location: [facility];  Service: Neurosurgery;  Laterality: N/A;   APPLICATION OF INTRAOPERATIVE CT SCAN N/A 11/16/2023   Procedure: APPLICATION OF INTRAOPERATIVE CT SCAN;  Surgeon: Clois Fret, MD;  Location: [facility];  Service: Neurosurgery;  Laterality: N/A;   CATARACT EXTRACTION Bilateral 06/05/1978   CESAREAN SECTION     x 2   CORNEAL TRANSPLANT Left    [facility] for blindness.   EYE SURGERY     FAR LATERAL DECOMPRESSION 1 LEVEL Right  11/16/2023   Procedure: FAR LATERAL DECOMPRESSION 1 LEVEL;  Surgeon: Clois Fret, MD;  Location: [facility];  Service: Neurosurgery;  Laterality: Right;  RIGHT L5-S1 FAR LATERAL FORAMINOTOMY, L3-4 DECOMPRESSION   TRANSFORAMINAL LUMBAR INTERBODY FUSION W/ MIS 1 LEVEL N/A 11/16/2023   Procedure: L4-5 MINIMALLY INVASIVE (MIS) TRANSFORAMINAL LUMBAR INTERBODY FUSION (TLIF);  Surgeon: Clois Fret, MD;  Location: [facility];  Service: Neurosurgery;  Laterality: N/A;  L4-5 TLIF    Family History  Problem Relation Age of Onset   Diabetes Mother    Hypertension Mother    Pancreatic cancer Mother    Alcohol abuse Father    Arthritis/Rheumatoid Sister    Heart failure Sister    Diabetes Sister    Kidney disease Sister    Diabetes Brother    Breast cancer Neg Hx     Social History   Tobacco Use   Smoking status: Former    Current packs/day: 1.00    Average packs/day: 1 pack/day for 47.5 years (47.5 ttl pk-yrs)    Types: Cigarettes    Start date: 01/07/1985   Smokeless tobacco: Never   Tobacco comments:    Per patient she quit 08/23/23  Substance Use Topics   Alcohol use: No    Alcohol/week: 0.0 standard drinks of alcohol     Current Outpatient Medications:    ACCU-CHEK GUIDE TEST test strip, SMARTSIG:Strip(s), Disp: , Rfl:    acitretin  (SORIATANE ) 25 MG capsule, Take 25 mg by mouth daily., Disp: , Rfl:    albuterol  (VENTOLIN  HFA) 108 (90 Base) MCG/ACT inhaler, Inhale 2 puffs into the lungs every 6 (six) hours as needed for wheezing or shortness of breath., Disp: , Rfl:    AquaLance Lancets 30G MISC, CHECK BLOOD SUGAR 2 TIMES A DAY, Disp: 200 each, Rfl: 3   atorvastatin  (LIPITOR) 40 MG tablet, Take 1 tablet (40 mg total) by mouth daily., Disp: 90 tablet, Rfl: 1   Blood Glucose Monitoring Suppl DEVI, 1 each by Does not apply route in the morning, at noon, and at bedtime. May substitute to any manufacturer covered by patient's insurance., Disp: 1 each, Rfl: 0   chlorhexidine   (HIBICLENS ) 4 % external liquid, Apply 15 mLs (1 Application total) topically as directed for 30 doses. Use as directed daily for 5 days every other week for 6 weeks., Disp: 946 mL, Rfl: 1   empagliflozin  (JARDIANCE ) 25 MG TABS tablet, Take 1 tablet (25 mg total) by mouth daily., Disp: 90 tablet, Rfl: 1   Fluticasone -Umeclidin-Vilant (TRELEGY ELLIPTA ) 200-62.5-25 MCG/ACT AEPB, Inhale 1 puff into the lungs daily., Disp: 28 each, Rfl: 11   gabapentin  (NEURONTIN ) 300 MG capsule, Take 1 capsule (300 mg total) by mouth 2 (two) times daily., Disp: 60 capsule, Rfl: 1   halobetasol (ULTRAVATE) 0.05 % ointment, Apply 1 Application topically 2 (two) times daily as needed (severe eczema flares)., Disp: , Rfl:    metFORMIN  (GLUCOPHAGE -XR) 750 MG 24 hr tablet, TAKE 2 TABLETS (1,500 MG TOTAL) BY MOUTHDAILY WITH BREAKFAST, Disp: 180 tablet, Rfl: 0   methocarbamol  (ROBAXIN ) 500 MG tablet, Take 1 tablet (500 mg total) by mouth every 6 (six) hours as needed for muscle spasms., Disp: 120 tablet, Rfl: 0   triamcinolone  cream (KENALOG ) 0.1 %, Apply 1 Application  topically 2 (two) times daily., Disp: , Rfl:    aspirin  EC 81 MG tablet, Take 81 mg by mouth daily. (Patient not taking: Reported on 03/19/2024), Disp: , Rfl:   Allergies  Allergen Reactions   Penicillin G    Penicillins Rash    I personally reviewed active problem list, medication list, allergies with the patient/caregiver today.   ROS  Ten systems reviewed and is negative except as mentioned in HPI    Objective Physical Exam CONSTITUTIONAL: Patient appears well-developed and well-nourished. No distress. HEENT: Head atraumatic, normocephalic, neck supple. CARDIOVASCULAR: Normal rate, regular rhythm and normal heart sounds. No murmur heard. No BLE edema. PULMONARY: Effort normal and breath sounds normal. Lungs clear to auscultation bilaterally. No respiratory distress. SKIN: pilling skin on palm of hands and some on distal arms MUSCULOSKELETAL:slow  gait, using a cane, scar from back surgery healed. PSYCHIATRIC: Patient has a normal mood and affect. Behavior is normal. Judgment and thought content normal.  Vitals:   03/19/24 1341  BP: 114/70  Pulse: 95  Resp: 16  SpO2: 98%  Weight: 165 lb (74.8 kg)  Height: 5' 2 (1.575 m)    Body mass index is 30.18 kg/m.  Recent Results (from the past 2160 hours)  POCT glycosylated hemoglobin (Hb A1C)     Status: Abnormal   Collection Time: 03/19/24  1:46 PM  Result Value Ref Range   Hemoglobin A1C 8.9 (A) 4.0 - 5.6 %   HbA1c POC (<> result, manual entry)     HbA1c, POC (prediabetic range)     HbA1c, POC (controlled diabetic range)      Diabetic Foot Exam:     PHQ2/9:    03/19/2024    1:34 PM 12/18/2023    9:54 AM 07/24/2023    9:42 AM 07/02/2023    1:01 PM 09/18/2022    3:23 PM  Depression screen PHQ 2/9  Decreased Interest 0 0 0 0 0  Down, Depressed, Hopeless 0 0 0 0 0  PHQ - 2 Score 0 0 0 0 0  Altered sleeping   0 0 0  Tired, decreased energy   0 0 0  Change in appetite   0 0 0  Feeling bad or failure about yourself    0 0 0  Trouble concentrating   0 0 0  Moving slowly or fidgety/restless   0 0 0  Suicidal thoughts   0 0 0  PHQ-9 Score   0 0 0  Difficult doing work/chores   Not difficult at all Not difficult at all     phq 9 is negative  Fall Risk:    03/19/2024    1:33 PM 12/18/2023    9:54 AM 07/24/2023    9:43 AM 07/02/2023    1:01 PM 09/18/2022    3:23 PM  Fall Risk   Falls in the past year? 0 0 0 0 0  Number falls in past yr: 0 0 1 0 0  Injury with Fall? 0 0 0 0 0  Risk for fall due to : No Fall Risks No Fall Risks History of fall(s);Impaired mobility;Impaired balance/gait No Fall Risks No Fall Risks  Follow up Falls evaluation completed Falls evaluation completed Falls prevention discussed;Falls evaluation completed Falls prevention discussed;Education provided;Falls evaluation completed Falls prevention discussed      Assessment & Plan Type 2 diabetes  mellitus with polyneuropathy and dyslipidemia Diabetes control worsened, A1c increased to 8.9%. Non-adherence to metformin  and high carbohydrate intake noted. Polyneuropathy likely diabetic. Dyslipidemia  managed with atorvastatin . Increased urinary frequency likely due to uncontrolled diabetes. - Encouraged adherence to metformin  750 mg XR twice daily. - continue Jardiance  25 mg daily  - Advised reduction in carbohydrate intake, specifically bread and potatoes. - Ordered fructosamine test for short-term glucose control on her next visit . - Continued atorvastatin . - Ordered urinalysis and urine culture. - Scheduled follow-up in one month.  Chronic low back pain status post lumbar surgery with neurogenic claudication Post-surgical numbness likely diabetic neuropathy. Leg pain resolved, numbness persists. - Continued gabapentin  and methocarbamol . - Encouraged follow-up with neurosurgeon in December.  Asthma-COPD overlap syndrome and interstitial lung disease Managed with Trelegy. No current respiratory symptoms. Last pulmonologist visit over a year ago. - Instructed to contact Dr. Tamea for follow-up and prescription renewal. - Ensured continued use of Trelegy daily.  Intrinsic atopic dermatitis with blistering Severe eczema with blistering persists despite current treatment. Missing some prescribed medications. - Provided clinical summary for pharmacist discussion. - Encouraged follow-up with dermatologist if symptoms persist or worsen.  General Health Maintenance Routine health maintenance overdue, including mammogram, colon cancer screening, bone density assessment, and Pap smear. - Ordered mammogram. - Ordered Cologuard for colon cancer screening. - Discussed and encouraged scheduling of Pap smear. - Discussed bone density test to be done with mammogram.

## 2024-03-19 NOTE — Patient Instructions (Addendum)
 acitretin  (SORIATANE ) 25 MG capsule  Indications: High risk medication use, Hand dermatitis Take 1 capsule (25 mg total) by mouth daily before breakfast. 30 capsule  2   03/13/2024 06/11/2024  clindamycin (CLEOCIN) 300 MG capsule  Indications: Intrinsic atopic dermatitis Take 1 capsule (300 mg total) by mouth two (2) times a day for 14 days. 28 capsule      03/13/2024 03/27/2024  upadacitinib (RINVOQ) 15 mg Tb24  Indications: Intrinsic atopic dermatitis Take 1 tablet (15 mg) by mouth daily. 90 tablet  3   03/13/2024    clobetasol  (TEMOVATE ) 0.05 % ointment  Indications: Intrinsic atopic dermatitis Apply 1 gram topically to affected area of skin twice daily as needed. Avoid use on the face. 60 g          Team Member Role and Visual merchandiser Info Address Start End Comments  Tamea Dedra CROME, MD Consulting Physician (Pulmonary Disease) Phone: [phone] Fax: [phone] [address] [place], [address] [place] [address] 12/18/2023 - -

## 2024-03-21 ENCOUNTER — Other Ambulatory Visit: Payer: Self-pay | Admitting: Family Medicine

## 2024-03-21 ENCOUNTER — Ambulatory Visit: Payer: Self-pay | Admitting: Family Medicine

## 2024-03-21 LAB — CULTURE, URINE COMPREHENSIVE
MICRO NUMBER:: 17104220
SPECIMEN QUALITY:: ADEQUATE

## 2024-03-21 MED ORDER — SULFAMETHOXAZOLE-TRIMETHOPRIM 400-80 MG PO TABS: 1.0000 | ORAL_TABLET | Freq: Two times a day (BID) | ORAL | 0 refills | Status: DC

## 2024-04-11 LAB — COLOGUARD: COLOGUARD: NEGATIVE

## 2024-04-16 ENCOUNTER — Other Ambulatory Visit: Payer: Self-pay | Admitting: Family Medicine

## 2024-04-16 ENCOUNTER — Other Ambulatory Visit: Payer: Self-pay | Admitting: Orthopedic Surgery

## 2024-04-17 ENCOUNTER — Telehealth: Payer: Self-pay | Admitting: Family Medicine

## 2024-04-17 MED ORDER — AQUALANCE LANCETS 30G MISC: 3 refills | Status: AC

## 2024-04-17 NOTE — Telephone Encounter (Signed)
 Accu Check softclix  Lancets #[phone]

## 2024-04-18 NOTE — Telephone Encounter (Signed)
 Requested medication (s) are due for refill today - yes  Requested medication (s) are on the active medication list -yes  Future visit scheduled -yes  Last refill: 12/27/23  Notes to clinic: historical provider- sent for review   Requested Prescriptions  Pending Prescriptions Disp Refills   ACCU-CHEK GUIDE TEST test strip [Pharmacy Med Name: ACCU-CHEK GUIDE TEST STRIP] 100 each     Sig: USE TO TEST BLOOD SUGAR 3 TIMES DAILY (MORNING, NOON, BEDTIME)     There is no refill protocol information for this order       Requested Prescriptions  Pending Prescriptions Disp Refills   ACCU-CHEK GUIDE TEST test strip [Pharmacy Med Name: ACCU-CHEK GUIDE TEST STRIP] 100 each     Sig: USE TO TEST BLOOD SUGAR 3 TIMES DAILY (MORNING, NOON, BEDTIME)     There is no refill protocol information for this order

## 2024-04-21 ENCOUNTER — Other Ambulatory Visit: Payer: Self-pay | Admitting: Family Medicine

## 2024-04-22 NOTE — Progress Notes (Signed)
 Pharmacy Quality Measure Review  This patient is appearing on a report for being at risk of failing the adherence measure for cholesterol (statin) medications this calendar year.   Medication: atorvastatin  Last fill date: 04/16/24 for 90 day supply  Insurance report was not up to date. No action needed at this time.   Mackensie Pilson E. Marsh, PharmD, CPP Clinical Pharmacist [facility] [phone]

## 2024-04-24 NOTE — Telephone Encounter (Signed)
 Requested medication (s) are due for refill today: yes  Requested medication (s) are on the active medication list: yes  Last refill:  12/27/23  Future visit scheduled: yes  Notes to clinic:  historical medication     Requested Prescriptions  Pending Prescriptions Disp Refills   ACCU-CHEK GUIDE TEST test strip [Pharmacy Med Name: ACCU-CHEK GUIDE TEST STRIP] 100 each     Sig: USE TO TEST BLOOD SUGAR 3 TIMES DAILY (MORNING, NOON, BEDTIME)     There is no refill protocol information for this order

## 2024-04-25 ENCOUNTER — Encounter: Payer: Self-pay | Admitting: Family Medicine

## 2024-04-25 ENCOUNTER — Ambulatory Visit (INDEPENDENT_AMBULATORY_CARE_PROVIDER_SITE_OTHER): Admitting: Family Medicine

## 2024-04-25 ENCOUNTER — Other Ambulatory Visit (HOSPITAL_COMMUNITY)
Admission: RE | Admit: 2024-04-25 | Discharge: 2024-04-25 | Disposition: A | Source: Ambulatory Visit | Attending: Family Medicine | Admitting: Family Medicine

## 2024-04-25 VITALS — BP 118/76 | HR 92 | Resp 16 | Ht 62.0 in | Wt 167.7 lb

## 2024-04-25 DIAGNOSIS — Z1151 Encounter for screening for human papillomavirus (HPV): Secondary | ICD-10-CM | POA: Insufficient documentation

## 2024-04-25 DIAGNOSIS — Z9181 History of falling: Secondary | ICD-10-CM | POA: Diagnosis not present

## 2024-04-25 DIAGNOSIS — Z124 Encounter for screening for malignant neoplasm of cervix: Secondary | ICD-10-CM | POA: Diagnosis not present

## 2024-04-25 DIAGNOSIS — R87610 Atypical squamous cells of undetermined significance on cytologic smear of cervix (ASC-US): Secondary | ICD-10-CM | POA: Diagnosis not present

## 2024-04-25 DIAGNOSIS — E1142 Type 2 diabetes mellitus with diabetic polyneuropathy: Secondary | ICD-10-CM

## 2024-04-25 DIAGNOSIS — N762 Acute vulvitis: Secondary | ICD-10-CM | POA: Diagnosis not present

## 2024-04-25 DIAGNOSIS — Z7984 Long term (current) use of oral hypoglycemic drugs: Secondary | ICD-10-CM

## 2024-04-25 MED ORDER — FLUCONAZOLE 150 MG PO TABS: 150.0000 mg | ORAL_TABLET | ORAL | 0 refills | Status: DC

## 2024-04-25 MED ORDER — RYBELSUS 7 MG PO TABS: 7.0000 mg | ORAL_TABLET | Freq: Every day | ORAL | 0 refills | Status: AC

## 2024-04-25 MED ORDER — RYBELSUS 3 MG PO TABS: 3.0000 mg | ORAL_TABLET | Freq: Every day | ORAL | 0 refills | Status: DC

## 2024-04-25 NOTE — Patient Instructions (Signed)
 Continue Metformin  Take sample of Rybelsus  3 mg plus Jardiance  Stop Jardiance  once you start Rybelsus  7 mg

## 2024-04-25 NOTE — Progress Notes (Signed)
 Name: Julie Wall   MRN: [id number]    DOB: September 24, 1955   Date:04/25/2024       Progress Note  Subjective  Chief Complaint  Chief Complaint  Patient presents with   Medical Management of Chronic Issues   Discussed the use of AI scribe software for clinical note transcription with the patient, who gave verbal consent to proceed.  History of Present Illness Julie Wall is a 68 year old female with diabetes and neuropathy who presents with a fall and shoulder pain.  She fell on Tuesday after visiting [employer]. The fall occurred when she was returning from the mailbox and misstepped due to neuropathy in her feet, which caused her to lose her shoe and fall backwards. She was wearing slides at the time. She denies any infection at the site of the fall but mentions shoulder pain located at the top of the shoulder.  She has a history of diabetes with a recent A1c of 8.9. She occasionally eats candy and does not consistently take her medications, which include metformin  and Jardiance . She reports some improvement in her diet, avoiding potatoes and starches, and is not experiencing increased hunger, thirst, or urination.  She uses a cane due to back issues and reports numbness in both feet, with the right leg being worse. She reports numbness in both feet, with the right leg being worse, following her previous back surgery.  No itching or burning associated with her eczema, which is present on her body    Patient Active Problem List   Diagnosis Date Noted   S/P lumbar fusion 11/16/2023   Asthma-COPD overlap syndrome (HCC) 10/09/2022   Thoracic spondylosis with myelopathy 10/02/2022   Spinal stenosis of thoracic region 10/02/2022   S/P spinal fusion 10/02/2022   B12 deficiency 09/27/2021   Hypertension associated with diabetes (HCC) 09/27/2021   Diabetic polyneuropathy associated with type 2 diabetes mellitus (HCC)    Hilar lymphadenopathy 02/05/2020   History of COVID-19 02/05/2020    Atherosclerosis of aorta 05/04/2017   Pap smear abnormality of cervix with ASCUS favoring benign 04/23/2017   Vitamin D  deficiency 12/14/2016   Obesity (BMI 30.0-34.9) 12/13/2015   Positive H. pylori test 04/05/2015   Umbilical hernia 04/01/2015   Exomphalos 04/01/2015   RLS (restless legs syndrome) 01/08/2015   Type 2 diabetes mellitus with hyperlipidemia (HCC) 01/08/2015   Neurogenic claudication 01/08/2015   AD (atopic dermatitis) 12/28/2014   Chronic low back pain 12/28/2014   Dyslipidemia 12/28/2014   Gastroesophageal reflux disease without esophagitis 12/28/2014   Microalbuminuria 12/28/2014   Disorder of bone and cartilage 12/28/2014   Perennial allergic rhinitis with seasonal variation 12/28/2014   Mononeuropathy of left lower extremity 12/28/2014   Hypertension, benign 09/22/2005    Past Surgical History:  Procedure Laterality Date   APPLICATION OF INTRAOPERATIVE CT SCAN N/A 10/02/2022   Procedure: APPLICATION OF INTRAOPERATIVE CT SCAN;  Surgeon: Clois Fret, MD;  Location: [facility];  Service: Neurosurgery;  Laterality: N/A;   APPLICATION OF INTRAOPERATIVE CT SCAN N/A 11/16/2023   Procedure: APPLICATION OF INTRAOPERATIVE CT SCAN;  Surgeon: Clois Fret, MD;  Location: [facility];  Service: Neurosurgery;  Laterality: N/A;   CATARACT EXTRACTION Bilateral 06/05/1978   CESAREAN SECTION     x 2   CORNEAL TRANSPLANT Left    [facility] for blindness.   EYE SURGERY     FAR LATERAL DECOMPRESSION 1 LEVEL Right 11/16/2023   Procedure: FAR LATERAL DECOMPRESSION 1 LEVEL;  Surgeon: Clois Fret, MD;  Location: [facility];  Service:  Neurosurgery;  Laterality: Right;  RIGHT L5-S1 FAR LATERAL FORAMINOTOMY, L3-4 DECOMPRESSION   TRANSFORAMINAL LUMBAR INTERBODY FUSION W/ MIS 1 LEVEL N/A 11/16/2023   Procedure: L4-5 MINIMALLY INVASIVE (MIS) TRANSFORAMINAL LUMBAR INTERBODY FUSION (TLIF);  Surgeon: Clois Fret, MD;  Location: [facility];  Service: Neurosurgery;  Laterality:  N/A;  L4-5 TLIF    Family History  Problem Relation Age of Onset   Diabetes Mother    Hypertension Mother    Pancreatic cancer Mother    Alcohol abuse Father    Arthritis/Rheumatoid Sister    Heart failure Sister    Diabetes Sister    Kidney disease Sister    Diabetes Brother    Breast cancer Neg Hx     Social History   Tobacco Use   Smoking status: Former    Current packs/day: 1.00    Average packs/day: 1 pack/day for 47.6 years (47.6 ttl pk-yrs)    Types: Cigarettes    Start date: 01/07/1985   Smokeless tobacco: Never   Tobacco comments:    Per patient she quit 08/23/23  Substance Use Topics   Alcohol use: No    Alcohol/week: 0.0 standard drinks of alcohol     Current Outpatient Medications:    acitretin  (SORIATANE ) 25 MG capsule, Take 25 mg by mouth daily., Disp: , Rfl:    albuterol  (VENTOLIN  HFA) 108 (90 Base) MCG/ACT inhaler, Inhale 2 puffs into the lungs every 6 (six) hours as needed for wheezing or shortness of breath., Disp: , Rfl:    AquaLance Lancets 30G MISC, CHECK BLOOD SUGAR 2 TIMES A DAY, Disp: 200 each, Rfl: 3   atorvastatin  (LIPITOR) 40 MG tablet, Take 1 tablet (40 mg total) by mouth daily., Disp: 90 tablet, Rfl: 1   Blood Glucose Monitoring Suppl DEVI, 1 each by Does not apply route in the morning, at noon, and at bedtime. May substitute to any manufacturer covered by patient's insurance., Disp: 1 each, Rfl: 0   chlorhexidine  (HIBICLENS ) 4 % external liquid, Apply 15 mLs (1 Application total) topically as directed for 30 doses. Use as directed daily for 5 days every other week for 6 weeks., Disp: 946 mL, Rfl: 1   fluconazole  (DIFLUCAN ) 150 MG tablet, Take 1 tablet (150 mg total) by mouth every other day., Disp: 3 tablet, Rfl: 0   Fluticasone -Umeclidin-Vilant (TRELEGY ELLIPTA ) 200-62.5-25 MCG/ACT AEPB, Inhale 1 puff into the lungs daily., Disp: 28 each, Rfl: 11   gabapentin  (NEURONTIN ) 300 MG capsule, TAKE 1 CAPSULE BY MOUTH TWICE DAILY, Disp: 60 capsule, Rfl:  1   glucose blood (ACCU-CHEK GUIDE TEST) test strip, USE TO TEST BLOOD SUGAR 3 TIMES DAILY (MORNING, NOON, BEDTIME), Disp: 100 each, Rfl: 2   halobetasol (ULTRAVATE) 0.05 % ointment, Apply 1 Application topically 2 (two) times daily as needed (severe eczema flares)., Disp: , Rfl:    metFORMIN  (GLUCOPHAGE -XR) 750 MG 24 hr tablet, TAKE 2 TABLETS (1,500 MG TOTAL) BY MOUTHDAILY WITH BREAKFAST, Disp: 180 tablet, Rfl: 0   methocarbamol  (ROBAXIN ) 500 MG tablet, Take 1 tablet (500 mg total) by mouth every 6 (six) hours as needed for muscle spasms., Disp: 120 tablet, Rfl: 0   Semaglutide  (RYBELSUS ) 3 MG TABS, Take 1 tablet (3 mg total) by mouth daily. Overlap with Jardiance , Disp: 30 tablet, Rfl: 0   Semaglutide  (RYBELSUS ) 7 MG TABS, Take 1 tablet (7 mg total) by mouth daily. Stop jardiance  when you start this dose, Disp: 90 tablet, Rfl: 0   triamcinolone  cream (KENALOG ) 0.1 %, Apply 1 Application topically 2 (  two) times daily., Disp: , Rfl:    aspirin  EC 81 MG tablet, Take 81 mg by mouth daily. (Patient not taking: Reported on 04/25/2024), Disp: , Rfl:   Allergies  Allergen Reactions   Penicillin G    Penicillins Rash    I personally reviewed active problem list, medication list, allergies with the patient/caregiver today.   ROS  Ten systems reviewed and is negative except as mentioned in HPI    Objective Physical Exam CONSTITUTIONAL: Patient appears well-developed and well-nourished. No distress. CARDIOVASCULAR: Normal rate, regular rhythm and normal heart sounds. No murmur heard. No BLE edema. PULMONARY: Effort normal and breath sounds normal. No respiratory distress. ABDOMINAL: There is no tenderness or distention. MUSCULOSKELETAL: Normal gait. Without gross motor or sensory deficit. Full range of motion in shoulder. No redness or bruising on shoulder. PSYCHIATRIC: Patient has a normal mood and affect. Behavior is normal. Judgment and thought content normal. GENITOURINARY: Signs of yeast  infection in vagina. Vulva inflamed and swollen , vaginal walls with white discharge  SKIN: Dry skin in inner thighs due to eczema.  Vitals:   04/25/24 1302  BP: 118/76  Pulse: 92  Resp: 16  SpO2: 95%  Weight: 167 lb 11.2 oz (76.1 kg)  Height: 5' 2 (1.575 m)    Body mass index is 30.67 kg/m.  Recent Results (from the past 2160 hours)  OPHTHALMOLOGY REPORT-SCANNED     Status: None   Collection Time: 02/07/24 12:00 AM  Result Value Ref Range   HM Diabetic Eye Exam No Retinopathy No Retinopathy    Comment: Abstracted by HIM  POCT glycosylated hemoglobin (Hb A1C)     Status: Abnormal   Collection Time: 03/19/24  1:46 PM  Result Value Ref Range   Hemoglobin A1C 8.9 (A) 4.0 - 5.6 %   HbA1c POC (<> result, manual entry)     HbA1c, POC (prediabetic range)     HbA1c, POC (controlled diabetic range)    POCT Urinalysis Dipstick     Status: Abnormal   Collection Time: 03/19/24  2:35 PM  Result Value Ref Range   Color, UA Yellow    Clarity, UA Cloudy    Glucose, UA Positive (A) Negative   Bilirubin, UA Negative    Ketones, UA Negative    Spec Grav, UA 1.020 1.010 - 1.025   Blood, UA Trace    pH, UA 5.0 5.0 - 8.0   Protein, UA Negative Negative   Urobilinogen, UA 0.2 0.2 or 1.0 E.U./dL   Nitrite, UA Negative    Leukocytes, UA Moderate (2+) (A) Negative   Appearance Yellow    Odor Foul   CULTURE, URINE COMPREHENSIVE     Status: Abnormal   Collection Time: 03/19/24  3:26 PM   Specimen: Urine  Result Value Ref Range   MICRO NUMBER: [id number]    SPECIMEN QUALITY: Adequate    Source OTHER (SPECIFY)    STATUS: FINAL    ISOLATE 1: Streptococcus agalactiae (A)     Comment: 10,000-49,000 CFU/mL of Group B Streptococcus isolated Beta-hemolytic streptococci are predictably susceptible to Penicillin and other beta-lactams. Susceptibility testing not routinely performed. Please contact the laboratory within 3 days if  susceptibility testing is desired. Erythromycin and clindamycin are  not recommended for treatment of urinary tract infections, but clindamycin may be useful for treatment of rectovaginal colonization or infection.   Cologuard     Status: None   Collection Time: 04/04/24 11:00 AM  Result Value Ref Range   COLOGUARD Negative Negative  Comment: The Cologuard (TM) test was performed on this specimen.  NEGATIVE TEST RESULT. A negative Cologuard result indicates a low likelihood that a colorectal cancer (CRC) or advanced adenoma (adenomatous polyps with more advanced pre-malignant features) is present. The chance that a person with a negative Cologuard test has a colorectal cancer is less than 1 in 1500 (negative predictive value >99.9%) or has an advanced adenoma is less than 5.3% (negative predictive value 94.7%). These data are based on a prospective cross-sectional study of 10,000 individuals at average risk for colorectal cancer who were screened with both Cologuard and colonoscopy. (Imperiale T. et al, N Engl J Med 2014;370(14):1286-1297) The normal value (reference range) for this assay is negative.  COLOGUARD RE-SCREENING RECOMMENDATION: Periodic colorectal cancer screening is an important part of preventive healthcare for asymptomatic individuals at average risk for colorectal cancer. Following a negative Cologuard  result, the American Cancer Society and U.S. Multi-Society Task Force screening guidelines recommend a Cologuard re-screening interval of 3 years.  References: American Cancer Society Guideline for Colorectal Cancer Screening: https://www.cancer.org/cancer/colon-rectal-cancer/detection-diagnosis-staging/acs-recommendations.html.; Rex DK, Boland CR, Dominitz JK, Colorectal Cancer Screening: Recommendations for Physicians and Patients from the U.S. Multi-Society Task Force on Colorectal Cancer Screening , Am J Gastroenterology 2017; 112:1016-1030.  TEST DESCRIPTION: Composite algorithmic analysis of stool DNA-biomarkers with hemoglobin immunoassay.    Quantitative values of individual biomarkers are not reportable and are not associated with individual biomarker result reference ranges. Cologuard is intended for colorectal cancer screening of adults of either sex, 45 years or older, who are at average-risk for colorectal cancer (CRC). Cologuard has been approved for use by the U.S.  FDA. The performance of Cologuard was established in a cross sectional study of average-risk adults aged 5-84. Cologuard performance in patients ages 71 to 12 years was estimated by sub-group analysis of near-age groups. Colonoscopies performed for a positive result may find as the most clinically significant lesion: colorectal cancer [4.0%], advanced adenoma (including sessile serrated polyps greater than or equal to 1cm diameter) [20%] or non- advanced adenoma [31%]; or no colorectal neoplasia [45%]. These estimates are derived from a prospective cross-sectional screening study of 10,000 individuals at average risk for colorectal cancer who were screened with both Cologuard and colonoscopy. (Imperiale T. et al, LOISE [place] J Med 2014;370(14):1286-1297.) Cologuard may produce a false negative or false positive result (no colorectal cancer or precancerous polyp present at colonoscopy follow up). A negative Cologuard test result does not guarantee the absence of CRC or advanced adenoma  (pre-cancer). The current Cologuard screening interval is every 3 years. Science Writer and U.[place]. Therapist, Music). Cologuard performance data in a 10,000 patient pivotal study using colonoscopy as the reference method can be accessed at the following location: www.exactlabs.com/results. Additional description of the Cologuard test process, warnings and precautions can be found at www.cologuard.com.     Diabetic Foot Exam:     PHQ2/9:    04/25/2024   12:54 PM 03/19/2024    1:34 PM 12/18/2023    9:54 AM 07/24/2023    9:42 AM 07/02/2023    1:01 PM  Depression screen PHQ 2/9   Decreased Interest 0 0 0 0 0  Down, Depressed, Hopeless 0 0 0 0 0  PHQ - 2 Score 0 0 0 0 0  Altered sleeping    0 0  Tired, decreased energy    0 0  Change in appetite    0 0  Feeling bad or failure about yourself     0 0  Trouble  concentrating    0 0  Moving slowly or fidgety/restless    0 0  Suicidal thoughts    0 0  PHQ-9 Score    0  0   Difficult doing work/chores    Not difficult at all Not difficult at all     Data saved with a previous flowsheet row definition    phq 9 is negative  Fall Risk:    04/25/2024   12:54 PM 03/19/2024    1:33 PM 12/18/2023    9:54 AM 07/24/2023    9:43 AM 07/02/2023    1:01 PM  Fall Risk   Falls in the past year? 1 0 0 0 0  Number falls in past yr: 0 0 0 1 0  Injury with Fall? 0 0 0 0 0  Risk for fall due to : Impaired balance/gait No Fall Risks No Fall Risks History of fall(s);Impaired mobility;Impaired balance/gait No Fall Risks  Follow up Falls evaluation completed Falls evaluation completed Falls evaluation completed Falls prevention discussed;Falls evaluation completed Falls prevention discussed;Education provided;Falls evaluation completed      Assessment & Plan Type 2 diabetes mellitus with diabetic polyneuropathy Diabetes management suboptimal with A1c 8.9. Neuropathy affects both feet, right leg more severely. Jardiance  may contribute to vulvovaginal candidiasis. She denies personal history of pancreatitis or family history of endocrine cancer - Ordered fructosamine test for one-month average glucose. - Continue metformin  - Initiate Rybelsus  3 mg daily with Jardiance  for one month, then discontinue Jardiance  upon starting Rybelsus  7 mg. - Provided Rybelsus  3 mg sample. - Follow-up in two months for A1c re-evaluation in 3 months  Vulvovaginal candidiasis Yeast infection likely exacerbated by Jardiance . Significant inflammation and swelling observed. - Prescribed Diflucan . - Discontinue Jardiance  upon starting Rybelsus  7  mg.  Right shoulder contusion Full range of motion present, no signs of fracture. - Advised massage for shoulder contusion and continue rom exercises .  Lumbar radiculopathy status post back surgery Post-surgical improvement in pain, persistent numbness in right leg likely due to nerve damage. - Continue current management and monitor symptoms.  Eczema Present on inner thighs with dryness and inflammation. - Advised application of cream to affected areas.

## 2024-04-29 ENCOUNTER — Ambulatory Visit: Payer: Self-pay | Admitting: Family Medicine

## 2024-04-29 DIAGNOSIS — E1142 Type 2 diabetes mellitus with diabetic polyneuropathy: Secondary | ICD-10-CM

## 2024-04-29 LAB — FRUCTOSAMINE: Fructosamine: 297 umol/L — ABNORMAL HIGH (ref 205–285)

## 2024-04-30 ENCOUNTER — Other Ambulatory Visit: Payer: Self-pay

## 2024-04-30 DIAGNOSIS — M4316 Spondylolisthesis, lumbar region: Secondary | ICD-10-CM

## 2024-04-30 LAB — CYTOLOGY - PAP
Adequacy: ABSENT
Comment: NEGATIVE
High risk HPV: NEGATIVE

## 2024-05-05 ENCOUNTER — Other Ambulatory Visit: Payer: Self-pay | Admitting: Family Medicine

## 2024-05-05 DIAGNOSIS — R87612 Low grade squamous intraepithelial lesion on cytologic smear of cervix (LGSIL): Secondary | ICD-10-CM

## 2024-05-08 ENCOUNTER — Ambulatory Visit

## 2024-05-08 ENCOUNTER — Ambulatory Visit: Admitting: Neurosurgery

## 2024-05-08 ENCOUNTER — Encounter: Payer: Self-pay | Admitting: Neurosurgery

## 2024-05-08 VITALS — BP 122/80 | Ht 62.0 in | Wt 167.0 lb

## 2024-05-08 DIAGNOSIS — M4316 Spondylolisthesis, lumbar region: Secondary | ICD-10-CM

## 2024-05-08 DIAGNOSIS — Z981 Arthrodesis status: Secondary | ICD-10-CM

## 2024-05-08 NOTE — Progress Notes (Signed)
   REFERRING PHYSICIAN:  Sowles, Krichna, Md [address] [place],  [place] [address]  DOS: 11/16/23 L4-5 TLIF   HISTORY OF PRESENT ILLNESS: 05/08/2024 She had a fall recently, which made her pain worse.  She has been improving.   02/06/24 Julie Wall is about 3 months s/p lumbar fusion. She still has numbness in her feet and stiffness in her back particularly when she first gets up but is overall doing well. She is no longer taking Celebrex  or Oxycodone .  12/20/23 Julie Wall presents today for follow-up status post TLIF.  She is working with physical therapy.  She is doing better.   PHYSICAL EXAMINATION:  General: Patient is well developed, well nourished, calm, collected, and in no apparent distress.   NEUROLOGICAL:  General: In no acute distress.   Awake, alert, oriented to person, place, and time.  Pupils equal round and reactive to light.   Strength: MAEW  Incisions well healed  ROS (Neurologic):  Negative except as noted above  IMAGING: Xrays 05/08/2024 No change  02/06/24 lumbar xrays  Without evidence of hardware malfunction  12/27/23 lumbar xrays  FINDINGS: For posterior fusion changes noted at T11-12 and L4-5. No hardware complicating feature. Normal alignment. No fracture. SI joints symmetric and unremarkable.   IMPRESSION: Posterior fusion changes at T11-12 (remote) and L4-5 (new). No hardware complicating feature.     Electronically Signed   By: Franky Crease M.D.   On: 01/02/2024 20:38  12/05/23 CT Lumbar spine IMPRESSION: Postsurgical changes and degenerative changes of the lumbar spine as above. Hardware is intact. Postsurgical changes within the subcutaneous tissues without evidence of fluid collection or soft tissue gas.     Electronically Signed   By: Donnice Mania M.D.   On: 12/05/2023 16:57   ASSESSMENT/PLAN:  Julie Wall is s/p TLIF.  She is largely doing well.   I will see her back in 6 months.  I expect her to make some  continued improvements.  Reeves Daisy MD Department of neurosurgery

## 2024-05-12 ENCOUNTER — Telehealth: Payer: Self-pay

## 2024-05-12 ENCOUNTER — Other Ambulatory Visit: Payer: Self-pay | Admitting: Family Medicine

## 2024-05-12 DIAGNOSIS — E785 Hyperlipidemia, unspecified: Secondary | ICD-10-CM

## 2024-05-12 NOTE — Progress Notes (Signed)
 Complex Care Management Note  Care Guide Note 05/12/2024 Name: [name] MRN: [id number] DOB: 28-May-1956  [name] is a 68 y.o. year old female who sees Sowles, Krichna, MD for primary care. I reached out to [employer] by phone today to offer complex care management services.  Ms. Shafran was given information about Complex Care Management services today including:   The Complex Care Management services include support from the care team which includes your Nurse Care Manager, Clinical Social Worker, or Pharmacist.  The Complex Care Management team is here to help remove barriers to the health concerns and goals most important to you. Complex Care Management services are voluntary, and the patient may decline or stop services at any time by request to their care team member.   Complex Care Management Consent Status: Patient agreed to services and verbal consent obtained.   Follow up plan:  Face to Face appointment with complex care management team member scheduled for:  05/26/24 at 1:00 p.m.   Encounter Outcome:  Patient Scheduled  Dreama [facility]  [facility], [facility] VBCI Assistant Direct Dial: [phone]  Fax: [phone]

## 2024-05-14 NOTE — Telephone Encounter (Signed)
 Requested Prescriptions  Pending Prescriptions Disp Refills   atorvastatin  (LIPITOR) 40 MG tablet [Pharmacy Med Name: ATORVASTATIN  CALCIUM  40 MG TAB] 90 tablet 0    Sig: TAKE 1 TABLET BY MOUTH DAILY     Cardiovascular:  Antilipid - Statins Failed - 05/14/2024 11:42 AM      Failed - Lipid Panel in normal range within the last 12 months    Cholesterol, Total  Date Value Ref Range Status  04/02/2015 119 100 - 199 mg/dL Final   Cholesterol  Date Value Ref Range Status  08/10/2023 92 <200 mg/dL Final   LDL Cholesterol (Calc)  Date Value Ref Range Status  08/10/2023 33 mg/dL (calc) Final    Comment:    Reference range: <100 . Desirable range <100 mg/dL for primary prevention;   <70 mg/dL for patients with CHD or diabetic patients  with > or = 2 CHD risk factors. SABRA LDL-C is now calculated using the Martin-Hopkins  calculation, which is a validated novel method providing  better accuracy than the Friedewald equation in the  estimation of LDL-C.  Gladis APPLETHWAITE et al. SANDREA. 7986;689(80): 2061-2068  (http://education.Quest[employer].com/faq/FAQ164)    HDL  Date Value Ref Range Status  08/10/2023 46 (L) > OR = 50 mg/dL Final  89/71/7983 49 >60 mg/dL Final    Comment:    According to ATP-III Guidelines, HDL-C >59 mg/dL is considered a negative risk factor for CHD.    Triglycerides  Date Value Ref Range Status  08/10/2023 57 <150 mg/dL Final         Passed - Patient is not pregnant      Passed - Valid encounter within last 12 months    Recent Outpatient Visits           2 weeks ago Pap smear abnormality of cervix with ASCUS favoring benign   [facility] Glenard Mire, MD   1 month ago DM type 2 with diabetic peripheral neuropathy [facility])   [facility] Sowles, Krichna, MD   4 months ago DM type 2 with diabetic peripheral neuropathy [facility])   [facility] Glenard Mire, MD   9 months ago  Neurogenic claudication   [facility] Sowles, Krichna, MD   9 months ago Acute right-sided thoracic back pain   [facility] Sowles, Krichna, MD

## 2024-05-26 ENCOUNTER — Ambulatory Visit

## 2024-05-26 NOTE — Progress Notes (Deleted)
 "  S:     Reason for visit: ?  Julie Wall is a 68 y.o. female with a history of diabetes (type 2), who presents today for an initial diabetes Face to Face pharmacotherapy visit.? Pertinent PMH also includes HTN, atherosclerosis of the aorta, GERD, HLD, obesity.  They were referred to the pharmacist by their PCP for assistance in managing diabetes.  Known DM Complications: {DM complications:33329}   Care Team: Primary Care Provider: Sowles, Krichna, MD  At last visit, ***.   Patient reports Diabetes was diagnosed in ***.   Current diabetes medications include: metformin  XR 750 mg 2 tablets daily, Rybelsus  *** Previous diabetes medications include: Janumet , Jardiance  (yeast infections) Current hyperlipidemia medications include: atorvastatin  40 mg daily   Patient reports adherence to taking all medications as prescribed.  *** Patient denies adherence with medications, reports missing *** medications *** times per week, on average.  Have you been experiencing any side effects to the medications prescribed? {YES NO:22349} Do you have any problems obtaining medications due to transportation or finances? {YES WN:77650} Insurance coverage: [facility] Medicare/secondary Medicaid  Current medication access support: ***  Patient {Actions; denies-reports:120008} hypoglycemic events.  Reported home fasting blood sugars: ***  Reported 2 hour post-meal/random blood sugars: ***.  Patient {Actions; denies-reports:120008} nocturia (nighttime urination).  Patient {Actions; denies-reports:120008} neuropathy (nerve pain). Patient {Actions; denies-reports:120008} visual changes. Patient {Actions; denies-reports:120008} self foot exams.   Patient reported dietary habits: Eats *** meals/day Breakfast: *** Lunch: *** Dinner: *** Snacks: *** Drinks: ***  Patient-reported exercise habits: *** DM Prevention:  Statin: {Blank single:19197::***,Taking,Not taking,Intolerant to,Declines};  {Blank single:19197::low intensity,moderate intensity,high intensity,n/a}.?  ACE/ARB: {Blank single:19197::yes,no}; *** History of chronic kidney disease? {Blank single:19197::yes,no} Last urinary albumin/creatinine ratio:  Lab Results  Component Value Date   MICRALBCREAT 15 08/10/2023   MICRALBCREAT NOTE 09/27/2021   MICRALBCREAT 4 10/22/2020   MICRALBCREAT 4 09/05/2019   MICRALBCREAT NOTE 10/21/2018   MICRALBCREAT 216 (H) 03/05/2018   Last eye exam:  Lab Results  Component Value Date   HMDIABEYEEXA No Retinopathy 02/07/2024   Lab Results  Component Value Date   HMDIABEYEEXA No Retinopathy 02/07/2024   Last foot exam: 06/29/2021 Tobacco Use:  Tobacco Use: Medium Risk (05/13/2024)   [facility] from [facility]   Patient History    Smoking Tobacco Use: Former    Smokeless Tobacco Use: Never    Passive Exposure: Not on file   O:  {CGM reports:33570} 7 day average blood glucose: ***  Libre3 CGM Download today *** % Time CGM is active: ***% Average Glucose: *** mg/dL Glucose Management Indicator: ***  Glucose Variability: ***% (goal <36%) Time in Goal:  - Time in range 70-180: ***% - Time above range: ***% - Time below range: ***% Observed patterns:  Vitals:  Wt Readings from Last 3 Encounters:  05/08/24 167 lb (75.8 kg)  04/25/24 167 lb 11.2 oz (76.1 kg)  03/19/24 165 lb (74.8 kg)   BP Readings from Last 3 Encounters:  05/08/24 122/80  04/25/24 118/76  03/19/24 114/70   Pulse Readings from Last 3 Encounters:  04/25/24 92  03/19/24 95  12/18/23 93     Labs:?  Lab Results  Component Value Date   HGBA1C 8.9 (A) 03/19/2024   HGBA1C 6.6 (H) 10/23/2023   HGBA1C >14.0 07/02/2023   GLUCOSE 109 (H) 11/05/2023   MICRALBCREAT 15 08/10/2023   MICRALBCREAT NOTE 09/27/2021   MICRALBCREAT 4 10/22/2020   CREATININE 0.56 11/05/2023   CREATININE 0.53 08/10/2023   CREATININE 0.50 09/19/2022  Lab Results  Component Value Date   CHOL 92  08/10/2023   LDLCALC 33 08/10/2023   LDLCALC 40 09/27/2021   LDLCALC 34 10/22/2020   HDL 46 (L) 08/10/2023   TRIG 57 08/10/2023   TRIG 60 09/27/2021   TRIG 46 10/22/2020   ALT 14 08/10/2023   ALT 25 09/19/2022   AST 14 08/10/2023   AST 18 09/19/2022      Chemistry      Component Value Date/Time   NA 143 11/05/2023 1007   NA 140 04/02/2015 0956   K 4.2 11/05/2023 1007   CL 112 (H) 11/05/2023 1007   CO2 21 (L) 11/05/2023 1007   BUN 20 11/05/2023 1007   BUN 10 08/07/2022 0000   CREATININE 0.56 11/05/2023 1007   CREATININE 0.53 08/10/2023 1130      Component Value Date/Time   CALCIUM  9.3 11/05/2023 1007   ALKPHOS 87 09/19/2022 0853   AST 14 08/10/2023 1130   ALT 14 08/10/2023 1130   BILITOT 0.4 08/10/2023 1130   BILITOT 0.2 04/02/2015 0956       The ASCVD Risk score (Arnett DK, et al., 2019) failed to calculate for the following reasons:   The valid total cholesterol range is 130 to 320 mg/dL  Lab Results  Component Value Date   MICRALBCREAT 15 08/10/2023   MICRALBCREAT NOTE 09/27/2021   MICRALBCREAT 4 10/22/2020   MICRALBCREAT 4 09/05/2019   MICRALBCREAT NOTE 10/21/2018   MICRALBCREAT 216 (H) 03/05/2018    A/P: Diabetes currently uncontrolled with a most recent A1c of 8.9% on 03/19/24, which is {DESC; UP/DOWN/UNCHANGED:18711} from *** on ***. Patient is *** able to verbalize appropriate hypoglycemia management plan. Medication adherence appears ***. Control is suboptimal due to ***. -{Meds adjust:18428} basal insulin  {basal insulins:33573}  *** units daily.  -{Meds adjust:18428} rapid insulin  {bolus insulin :33574} ***.  -{Meds adjust:18428} GLP-1 {GLP1 options:33572} *** mg .  -{Meds adjust:18428} SGLT2-I {SGLT2i options:33571}*** mg daily.  -{Meds adjust:18428} metformin  ***.  -Patient educated on purpose, proper use, and potential adverse effects of ***.  -Extensively discussed pathophysiology of diabetes, recommended lifestyle interventions, dietary effects  on blood sugar control.  -Counseled on s/sx of and management of hypoglycemia.  -Next A1c anticipated ***.   ASCVD risk - secondary prevention in patient with diabetes. Last LDL is 48 mg/dL, at goal of <29 *** mg/dL. ASCVD risk factors include *** and 10-year ASCVD risk score of ***. {Desc; low/moderate/high:110033} intensity statin indicated.  -{Meds adjust:18428} {statin therapies:33576} *** mg daily.  -{Meds adjust:18428} ezetimibe 10 mg daily   Hypertension longstanding *** currently ***. Blood pressure goal of <130/80 *** mmHg. Medication adherence ***. Blood pressure control is suboptimal due to ***. -{Meds adjust:18428} *** mg ***.  {pharmacisttime:33368}  Follow-up:  Pharmacist on *** PCP clinic visit on ***  Peyton CHARLENA Ferries, PharmD, CPP Clinical Pharmacist [facility] [phone]   "

## 2024-05-30 ENCOUNTER — Telehealth: Payer: Self-pay

## 2024-05-30 NOTE — Progress Notes (Signed)
 Complex Care Management Care Guide Note  05/30/2024 Name: Karsynn Deweese MRN: [id number] DOB: 09/25/1955  Carolene Gitto is a 68 y.o. year old female who is a primary care patient of Sowles, Krichna, MD and is actively engaged with the care management team. I reached out to [facility] by phone today to assist with re-scheduling  with the Pharmacist.  Follow up plan: Telephone appointment with complex care management team member scheduled for:  06/03/24 at 10:00 a.m.   Dreama [facility]  [facility], [facility] VBCI Assistant Direct Dial: [phone]  Fax: [phone]

## 2024-06-03 ENCOUNTER — Other Ambulatory Visit (INDEPENDENT_AMBULATORY_CARE_PROVIDER_SITE_OTHER)

## 2024-06-03 ENCOUNTER — Encounter: Payer: Self-pay | Admitting: Obstetrics

## 2024-06-03 DIAGNOSIS — Z7984 Long term (current) use of oral hypoglycemic drugs: Secondary | ICD-10-CM

## 2024-06-03 NOTE — Progress Notes (Signed)
 "  S:     Reason for visit: ?  Julie Wall is a 68 y.o. female with a history of diabetes (type 2), who presents today for an initial diabetes Telephone pharmacotherapy visit.? Pertinent PMH also includes HTN, atherosclerosis of the aorta, GERD, HLD, obesity.  They were referred to the pharmacist by their PCP for assistance in managing diabetes.  Care Team: Primary Care Provider: Sowles, Krichna, MD  At last visit with PCP on 04/25/24, patient reported inconsistent medication adherence. She was treated for vulvovaginal candidiasis at that time. She was instructed to initiate Rybelsus  3 mg daily for one month and then discontinue Jardiance  and increase Rybelsus  to 7 mg daily.   Today, she reports GU infection symptoms have resolved with fluconazole  use. She reports the pharmacy filled her Rybelsus  3 mg, instead of 7 mg. However, it appears that this was picked up 04/25/24, so she should be able to refill 7 mg at this time. She has a full bottle of Rybelsus  3 mg at home.   Current diabetes medications include: metformin  XR 750 mg 2 tablets daily, Rybelsus  3 mg daily, Jardiance  25 mg daily  Previous diabetes medications include: Janumet  Current hyperlipidemia medications include: atorvastatin  40 mg daily   Patient reports missing a dose of her medications once a week. Patient reports she uses a pill box to assist with adherence.   Have you been experiencing any side effects to the medications prescribed? no Do you have any problems obtaining medications due to transportation or finances? no Insurance coverage: [facility] Medicare/secondary Medicaid  Patient denies hypoglycemic events.  Reported home fasting blood sugars: not checking  Patient reports nocturia (nighttime urination) about 2 times a night at baseline  Patient reported dietary habits: Eats 2 meals/day Breakfast: cinnamon toast crunch cereal  Dinner: beans, greens, meat Drinks: zero pepsi, water   Patient-reported exercise  habits: none reported  DM Prevention:  Statin: Taking; high intensity.?  ACE/ARB: no Last urinary albumin/creatinine ratio:  Lab Results  Component Value Date   MICRALBCREAT 15 08/10/2023   MICRALBCREAT NOTE 09/27/2021   MICRALBCREAT 4 10/22/2020   MICRALBCREAT 4 09/05/2019   MICRALBCREAT NOTE 10/21/2018   MICRALBCREAT 216 (H) 03/05/2018   Last eye exam:  Lab Results  Component Value Date   HMDIABEYEEXA No Retinopathy 02/07/2024   Lab Results  Component Value Date   HMDIABEYEEXA No Retinopathy 02/07/2024   Last foot exam: 06/29/2021 Tobacco Use:  Tobacco Use: Medium Risk (05/13/2024)   Received from [facility]   Patient History    Smoking Tobacco Use: Former    Smokeless Tobacco Use: Never    Passive Exposure: Not on file   O:  Vitals:  Wt Readings from Last 3 Encounters:  05/08/24 167 lb (75.8 kg)  04/25/24 167 lb 11.2 oz (76.1 kg)  03/19/24 165 lb (74.8 kg)   BP Readings from Last 3 Encounters:  05/08/24 122/80  04/25/24 118/76  03/19/24 114/70   Pulse Readings from Last 3 Encounters:  04/25/24 92  03/19/24 95  12/18/23 93     Labs:?  Lab Results  Component Value Date   HGBA1C 8.9 (A) 03/19/2024   HGBA1C 6.6 (H) 10/23/2023   HGBA1C >14.0 07/02/2023   GLUCOSE 109 (H) 11/05/2023   MICRALBCREAT 15 08/10/2023   MICRALBCREAT NOTE 09/27/2021   MICRALBCREAT 4 10/22/2020   CREATININE 0.56 11/05/2023   CREATININE 0.53 08/10/2023   CREATININE 0.50 09/19/2022    Lab Results  Component Value Date   CHOL 92 08/10/2023  LDLCALC 33 08/10/2023   LDLCALC 40 09/27/2021   LDLCALC 34 10/22/2020   HDL 46 (L) 08/10/2023   TRIG 57 08/10/2023   TRIG 60 09/27/2021   TRIG 46 10/22/2020   ALT 14 08/10/2023   ALT 25 09/19/2022   AST 14 08/10/2023   AST 18 09/19/2022      Chemistry      Component Value Date/Time   NA 143 11/05/2023 1007   NA 140 04/02/2015 0956   K 4.2 11/05/2023 1007   CL 112 (H) 11/05/2023 1007   CO2 21 (L) 11/05/2023 1007   BUN 20  11/05/2023 1007   BUN 10 08/07/2022 0000   CREATININE 0.56 11/05/2023 1007   CREATININE 0.53 08/10/2023 1130      Component Value Date/Time   CALCIUM  9.3 11/05/2023 1007   ALKPHOS 87 09/19/2022 0853   AST 14 08/10/2023 1130   ALT 14 08/10/2023 1130   BILITOT 0.4 08/10/2023 1130   BILITOT 0.2 04/02/2015 0956       The ASCVD Risk score (Arnett DK, et al., 2019) failed to calculate for the following reasons:   The valid total cholesterol range is 130 to 320 mg/dL  Lab Results  Component Value Date   MICRALBCREAT 15 08/10/2023   MICRALBCREAT NOTE 09/27/2021   MICRALBCREAT 4 10/22/2020   MICRALBCREAT 4 09/05/2019   MICRALBCREAT NOTE 10/21/2018   MICRALBCREAT 216 (H) 03/05/2018    A/P: Diabetes currently uncontrolled with a most recent A1c of 8.9% on 03/19/24. Patient is able to verbalize appropriate hypoglycemia management plan. Medication adherence appears suboptimal. Unable to fully assess control d/t lack of BG readings. Patient currently has a full bottle of Rybelsus  3 mg at home, however, this was filled on 04/25/24. Patient should be able to refill Rybelsus  7 mg at this time and take two tablets of the 3 mg dose.  -Increased dose of GLP-1 Rybelsus  (semaglutide ) to 7 mg daily. Instructed she can take two of the 3 mg tablets -Discontinued SGLT2-I Jardiance  (empagliflozin ) 25 mg daily as instructed by PCP. May be able to re-initiate when BG is closer to goal with close monitoring of GU infection symptoms -Continued metformin  XR 750 mg BID -Extensively discussed pathophysiology of diabetes, recommended lifestyle interventions, dietary effects on blood sugar control.  -Counseled on s/sx of and management of hypoglycemia.  -Next A1c anticipated 06/2024.  -Counseled on adherence measures, such as phone alarms. Offered to have follow up in person to assist with setting up phone alarms, but patient declined.  -Counseled to monitor FBP 2-3x per week. Offered in-person education on BG  meter; patient declined.   ASCVD risk - secondary prevention in patient with diabetes. Last LDL is 48 mg/dL, at goal of <44 mg/dL. -Continued atorvastatin  40 mg daily.   Patient verbalized understanding of treatment plan. Total time patient counseling 45 minutes.  Follow-up:  Pharmacist on 06/30/24 PCP clinic visit on 07/29/24  Peyton CHARLENA Ferries, PharmD, CPP Clinical Pharmacist [facility] [phone]   "

## 2024-06-20 ENCOUNTER — Emergency Department

## 2024-06-20 ENCOUNTER — Emergency Department
Admission: EM | Admit: 2024-06-20 | Discharge: 2024-06-20 | Disposition: A | Discharge status: Home / Self Care | Attending: Emergency Medicine | Admitting: Emergency Medicine

## 2024-06-20 ENCOUNTER — Other Ambulatory Visit: Payer: Self-pay

## 2024-06-20 DIAGNOSIS — I10 Essential (primary) hypertension: Secondary | ICD-10-CM | POA: Diagnosis not present

## 2024-06-20 DIAGNOSIS — M25511 Pain in right shoulder: Secondary | ICD-10-CM | POA: Diagnosis present

## 2024-06-20 DIAGNOSIS — E119 Type 2 diabetes mellitus without complications: Secondary | ICD-10-CM | POA: Diagnosis not present

## 2024-06-20 DIAGNOSIS — J449 Chronic obstructive pulmonary disease, unspecified: Secondary | ICD-10-CM | POA: Diagnosis not present

## 2024-06-20 MED ORDER — ACETAMINOPHEN 325 MG PO TABS
650.0000 mg | ORAL_TABLET | Freq: Once | ORAL | Status: AC
  Administered 2024-06-20: 650 mg via ORAL
  Filled 2024-06-20: qty 2

## 2024-06-20 NOTE — ED Triage Notes (Signed)
 Pt to ED for right shoulder pain since November when fell.

## 2024-06-20 NOTE — Discharge Instructions (Addendum)
 Please follow-up with orthopedics.  Please return for any new, worsening, or changing symptoms or other concerns.  Is a pleasure caring for you today.

## 2024-06-20 NOTE — ED Provider Notes (Signed)
 "  [facility] Provider Note    Event Date/Time   First MD Initiated Contact with Patient 06/20/24 [phone]     (approximate)   History   Shoulder Pain   HPI  Julie Wall is a 69 y.o. female with a past medical history of asthma/COPD, thoracic spondylosis, type 2 diabetes, hyperlipidemia, hypertension who presents today for evaluation of shoulder pain.  Patient reports that she fell on her right shoulder in November and did not seek medical attention.  She reports that she has continued to have pain since that time.  Her pain is worse with overhead movements.  She denies weakness or paresthesias in her arm.  There was no head strike or LOC at the time of the fall.  She denies neck pain.  Patient Active Problem List   Diagnosis Date Noted   S/P lumbar fusion 11/16/2023   Asthma-COPD overlap syndrome (HCC) 10/09/2022   Thoracic spondylosis with myelopathy 10/02/2022   Spinal stenosis of thoracic region 10/02/2022   S/P spinal fusion 10/02/2022   B12 deficiency 09/27/2021   Hypertension associated with diabetes (HCC) 09/27/2021   Diabetic polyneuropathy associated with type 2 diabetes mellitus (HCC)    Hilar lymphadenopathy 02/05/2020   History of COVID-19 02/05/2020   Atherosclerosis of aorta 05/04/2017   Pap smear abnormality of cervix with ASCUS favoring benign 04/23/2017   Vitamin D  deficiency 12/14/2016   Obesity (BMI 30.0-34.9) 12/13/2015   Positive H. pylori test 04/05/2015   Umbilical hernia 04/01/2015   Exomphalos 04/01/2015   RLS (restless legs syndrome) 01/08/2015   Type 2 diabetes mellitus with hyperlipidemia (HCC) 01/08/2015   Neurogenic claudication 01/08/2015   AD (atopic dermatitis) 12/28/2014   Chronic low back pain 12/28/2014   Dyslipidemia 12/28/2014   Gastroesophageal reflux disease without esophagitis 12/28/2014   Microalbuminuria 12/28/2014   Disorder of bone and cartilage 12/28/2014   Perennial allergic rhinitis with seasonal  variation 12/28/2014   Mononeuropathy of left lower extremity 12/28/2014   Hypertension, benign 09/22/2005          Physical Exam   Triage Vital Signs: ED Triage Vitals  Encounter Vitals Group     BP 06/20/24 0901 (!) 151/95     Girls Systolic BP Percentile --      Girls Diastolic BP Percentile --      Boys Systolic BP Percentile --      Boys Diastolic BP Percentile --      Pulse Rate 06/20/24 0901 98     Resp 06/20/24 0901 20     Temp 06/20/24 0901 98.4 F (36.9 C)     Temp src --      SpO2 06/20/24 0901 97 %     Weight 06/20/24 0900 162 lb (73.5 kg)     Height 06/20/24 0900 4' 10 (1.473 m)     Head Circumference --      Peak Flow --      Pain Score 06/20/24 0900 10     Pain Loc --      Pain Education --      Exclude from Growth Chart --     Most recent vital signs: Vitals:   06/20/24 0901  BP: (!) 151/95  Pulse: 98  Resp: 20  Temp: 98.4 F (36.9 C)  SpO2: 97%    Physical Exam Vitals and nursing note reviewed.  Constitutional:      General: Awake and alert. No acute distress.    Appearance: Normal appearance. The patient is normal weight.  HENT:     Head: Normocephalic and atraumatic.     Mouth: Mucous membranes are moist.  Eyes:     General: PERRL. Normal EOMs        Right eye: No discharge.        Left eye: No discharge.     Conjunctiva/sclera: Conjunctivae normal.  Cardiovascular:     Rate and Rhythm: Normal rate and regular rhythm.     Pulses: Normal pulses.     Heart sounds: Normal heart sounds Pulmonary:     Effort: Pulmonary effort is normal. No respiratory distress.     Breath sounds: Normal breath sounds.  Abdominal:     Abdomen is soft. There is no abdominal tenderness. No rebound or guarding. No distention. Musculoskeletal:        General: No swelling. Normal range of motion.     Cervical back: Normal range of motion and neck supple. No midline cervical spine tenderness.  Full range of motion of neck.  Negative Spurling test.  Normal  strength and sensation in bilateral upper extremities. Normal grip strength bilaterally.  Normal intrinsic muscle function of the hand bilaterally.  Normal radial pulses bilaterally. Right shoulder: No obvious deformity, swelling, ecchymosis, or erythema No clavicular or AC joint tenderness Able to actively and passively forward flex and abduct at shoulder fully, negative drop arm test Positive Obriens and empty can, and pain with SLAP Normal internal and external rotation against resistance Normal ROM at elbow and wrist Normal resisted pronation and supination 2+ radial pulse Normal grip strength Normal intrinsic hand muscle function Skin:    General: Skin is warm and dry.     Capillary Refill: Capillary refill takes less than 2 seconds.     Findings: No rash.  Neurological:     Mental Status: The patient is awake and alert.      ED Results / Procedures / Treatments   Labs (all labs ordered are listed, but only abnormal results are displayed) Labs Reviewed - No data to display   EKG     RADIOLOGY I independently reviewed and interpreted imaging and agree with radiologists findings.     PROCEDURES:  Critical Care performed:   Procedures   MEDICATIONS ORDERED IN ED: Medications  acetaminophen  (TYLENOL ) tablet 650 mg (650 mg Oral Given 06/20/24 1005)     IMPRESSION / MDM / ASSESSMENT AND PLAN / ED COURSE  I reviewed the triage vital signs and the nursing notes.   Differential diagnosis includes, but is not limited to, rotator cuff injury, proximal humerus fracture, labral injury.  Patient is awake and alert, hemodynamically stable and afebrile.  She is nontoxic in appearance.  She has normal strength and sensation in bilateral upper extremities, normal grip strength bilaterally, normal intrinsic muscle function of the hands bilaterally, not consistent with central cord syndrome.  Normal pulses bilaterally, she is neurovascularly intact.  No swelling to her upper  extremity, no skin changes.  No evidence of DVT or infectious etiology  X-ray obtained is negative for any acute osseous injury.  She has discomfort with rotator cuff provocative testing, suggestive of rotator cuff injury.  I recommended that she follow-up with orthopedics and the appropriate follow-up information was provided.  Discussed return precautions in the meantime.  Patient understands and agrees with plan.  Discharged in stable condition.   Patient's presentation is most consistent with acute complicated illness / injury requiring diagnostic workup.    FINAL CLINICAL IMPRESSION(S) / ED DIAGNOSES   Final diagnoses:  Right  shoulder pain, unspecified chronicity     Rx / DC Orders   ED Discharge Orders     None        Note:  This document was prepared using Dragon voice recognition software and may include unintentional dictation errors.   Morena Mckissack E, PA-C 06/20/24 1227    Viviann Pastor, MD 06/20/24 1406  "

## 2024-06-23 NOTE — Progress Notes (Unsigned)
 "  Referring Provider:  Dorette Loron MD  HPI:  Julie Wall is a 69 y.o.  G3P0  who presents today for evaluation and management of abnormal cervical cytology.    Prior pap smears:  04/25/2024:  LSIL HPV neg 05/25/2022:  ASCUS HPV neg 10/26/2020:  LSIL HPV neg 04/18/2017:  ASCUS HPV neg 10/02/2013:  ASCUS HPV neg  Prior cervical / vaginal findings: *** Date:*** Results: ***  Prior cervical treatment(s): *** Date:*** Results: ***  Symptoms/History:  -Abnormal vaginal discharge: *** -Postmenopausal: *** -Intermenstrual bleeding: *** -Postcoital bleeding: *** -Bleeding problems (non-gyn): *** -Contraception: *** -Number of current sexual partners: *** -Number of partners in lifetime: *** -History of a high risk partner: *** -History of STDs: *** -Smoking: *** -Gardasil Vaccine: ***      ROS:  {Ros - complete:30496}  OB History  Gravida Para Term Preterm AB Living  3     3  SAB IAB Ectopic Multiple Live Births          # Outcome Date GA Lbr Len/2nd Weight Sex Type Anes PTL Lv  3 Gravida           2 Gravida           1 Gravida             Past Medical History:  Diagnosis Date   Allergy    Arthritis    ASCUS favor benign 09/2013   negative HPV   Atopic dermatitis    Dermatologist at [facility]   Chronic low back pain    COPD (chronic obstructive pulmonary disease) (HCC)    Decreased exercise tolerance    Dental caries    Diabetes mellitus without complication (HCC)    Dyspnea    GERD (gastroesophageal reflux disease)    Hand dermatitis 10/13/2021   a.) PCR (+) 10/13/2021 and 08/17/2022 for MSSA + SDSE (Streptococcus dysgalactiae ss equisimilis)   Hyperlipidemia    Hypertension    Legally blind in left eye, as defined in [place]     Lumbosacral neuritis    Microalbuminuria    Osteopenia    Ovarian failure    Sleep apnea    no cpap   Tobacco use     Past Surgical History:  Procedure Laterality Date   APPLICATION OF INTRAOPERATIVE CT SCAN N/A 10/02/2022    Procedure: APPLICATION OF INTRAOPERATIVE CT SCAN;  Surgeon: Clois Fret, MD;  Location: [facility];  Service: Neurosurgery;  Laterality: N/A;   APPLICATION OF INTRAOPERATIVE CT SCAN N/A 11/16/2023   Procedure: APPLICATION OF INTRAOPERATIVE CT SCAN;  Surgeon: Clois Fret, MD;  Location: [facility];  Service: Neurosurgery;  Laterality: N/A;   CATARACT EXTRACTION Bilateral 06/05/1978   CESAREAN SECTION     x 2   CORNEAL TRANSPLANT Left    [facility]-Treatment for blindness.   EYE SURGERY     FAR LATERAL DECOMPRESSION 1 LEVEL Right 11/16/2023   Procedure: FAR LATERAL DECOMPRESSION 1 LEVEL;  Surgeon: Clois Fret, MD;  Location: [facility];  Service: Neurosurgery;  Laterality: Right;  RIGHT L5-S1 FAR LATERAL FORAMINOTOMY, L3-4 DECOMPRESSION   TRANSFORAMINAL LUMBAR INTERBODY FUSION W/ MIS 1 LEVEL N/A 11/16/2023   Procedure: L4-5 MINIMALLY INVASIVE (MIS) TRANSFORAMINAL LUMBAR INTERBODY FUSION (TLIF);  Surgeon: Clois Fret, MD;  Location: [facility];  Service: Neurosurgery;  Laterality: N/A;  L4-5 TLIF    SOCIAL HISTORY:  Social History   Substance and Sexual Activity  Alcohol Use No   Alcohol/week: 0.0 standard drinks of alcohol    Social History  Substance and Sexual Activity  Drug Use No     Family History  Problem Relation Age of Onset   Diabetes Mother    Hypertension Mother    Pancreatic cancer Mother    Alcohol abuse Father    Arthritis/Rheumatoid Sister    Heart failure Sister    Diabetes Sister    Kidney disease Sister    Diabetes Brother    Breast cancer Neg Hx     ALLERGIES:  Penicillin g and Penicillins  She has a current medication list which includes the following prescription(s): albuterol , aqualance lancets 30g, aspirin  ec, atorvastatin , blood glucose monitoring suppl, trelegy ellipta , gabapentin , accu-chek guide test, halobetasol, metformin , methocarbamol , rinvoq, rybelsus , and triamcinolone  cream.  Physical Exam: -Vitals:  There were no vitals  taken for this visit.  PROCEDURE: Colposcopy performed with 4% acetic acid and Lugol's after informed consent obtained. Urine pregnancy test negative***.  Physical Exam                            -Aceto-white Lesions Location(s): See above              -Biopsy performed at *** o'clock               -ECC indicated and performed: {yes no:314532}     -Biopsy sites made hemostatic with pressure and Monsel's solution   -Satisfactory colposcopy: {yes no:314532}    -Evidence of Invasive cervical CA :  NO  ASSESSMENT:  Julie Wall is a 69 y.o. G3P0 with LSIL***ASCUS and HPV-HR positive*** 16/18/45 POS***NEG on recent pap (DATE), here for colposcopy today, performed as above without complications.  -ECC and *** cervical bx sent to pathology -Aftercare instructions for home reviewed, si/sx of when to call/return discussed. -Gardasil counseling done, #1 given today *** pt to consider.  -Discussed possible outcomes based on pathology; will call with results   Estil Mangle, DO [facility] "

## 2024-06-25 ENCOUNTER — Encounter: Admitting: Obstetrics

## 2024-06-25 DIAGNOSIS — R87612 Low grade squamous intraepithelial lesion on cytologic smear of cervix (LGSIL): Secondary | ICD-10-CM

## 2024-06-25 DIAGNOSIS — Z01812 Encounter for preprocedural laboratory examination: Secondary | ICD-10-CM

## 2024-06-29 NOTE — Progress Notes (Incomplete)
 Pre-Chart Notes:  Diabetes: Labs/BG Assessment DM: Rybelsus  7mg  filled 06/16/24 A1c spike d/t diet and forgetting meds Plan DM: Glucometer counseling? Need a new A1c Assess adherence A: inc metforming B: Glargine Need 30 days on 7 mg rybels before inc to 14mg  Follow Up    06/29/2024 Name: Julie Wall MRN: [id number] DOB: 1955-08-10  Subjective  No chief complaint on file.   Reason for visit: ?  Julie Wall is a 69 y.o. female with a history of diabetes (type 2), who presents today for an initial diabetes Telephone pharmacotherapy visit.? Pertinent PMH also includes HTN, atherosclerosis of the aorta, GERD, HLD, obesity.   They were referred to the pharmacist by their PCP for assistance in managing diabetes.  Known DM Complications: {lzdiabetescomplications:33838}   Care Team: Primary Care Provider: Sowles, Krichna, MD   Date of Last Diabetes Related Visit: {LZ with PCP with pharmacist:31532}  Recent Summary of Change: She reports GU infection symptoms have resolved with fluconazole  use.  Discussed increasing Rybelsus  to 7 mg daily.  Counseled to monitor FBG 2-3x per week.  Medication Access/Adherence: Prescription drug coverage: Payor: [employer]ICARE / Plan: DREMA DUAL COMPLETE / Product Type: *No Product type* / .  - Reports that all medications are *** affordable.  - Current Patient Assistance: None*** - Medication Adherence: Patient denies*** missing doses of their medication.    Since Last visit / History of Present Illness: ?  Patient reports implementing*** plan from last visit. Denies adverse effects with titration of ***.   Reported DM Regimen: ?  metformin  XR 750 mg 2 tablets daily  Rybelsus  7 mg daily    DM medications tried in the past:?  Jardiance  (GU infection) Janumet     SMBG: Glucometer ?  ***   Hypo/Hyperglycemia: ?  Symptoms of hypoglycemia since last visit:? {Blank multiple:19197::yes,no,n/a,***}  Symptoms  of hyperglycemia since last visit:? {Blank multiple:19197::yes,no,n/a,***} - {symptoms; hyperglycemia:17903}  Reported Diet: Patient typically eats 2 meals per day.  Breakfast: cinnamon toast crunch cereal  Dinner: beans, greens, meat Drinks: zero pepsi, water   Exercise: none  DM Prevention:  Statin: Taking; high intensity.?  History of chronic kidney disease? {Blank single:19197::yes,no} History of albuminuria? {Blank single:19197::yes,no}, last UACR on 08/10/23 = 15 mg/g Lab Results  Component Value Date   MICRALBCREAT 15 08/10/2023   MICRALBCREAT NOTE 09/27/2021   MICRALBCREAT 4 10/22/2020   MICRALBCREAT 4 09/05/2019   MICRALBCREAT NOTE 10/21/2018   MICRALBCREAT 216 (H) 03/05/2018   ACE/ARB - Not taking ***; Urine MA/CR Ratio - <30 mg/g.  Last eye exam:  Lab Results  Component Value Date   HMDIABEYEEXA No Retinopathy 02/07/2024   Last foot exam: 08/04/2015 Tobacco Use:  Tobacco Use: Medium Risk (06/20/2024)   Patient History    Smoking Tobacco Use: Former    Smokeless Tobacco Use: Never    Passive Exposure: Not on file   Cardiovascular Risk Reduction History of clinical ASCVD? yes History of heart failure? no History of hyperlipidemia? yes Current BMI: 33.86 kg/m2 (Ht 58 in, Wt 73.5 kg) Taking statin? yes; high intensity (atorvastatin  40 mg) Taking aspirin ? indicated (secondary prevention); Taking   Taking SGLT-2i? no Taking GLP- 1 RA? yes      _______________________________________________  Objective      Physical Examination:  Vitals:  Wt Readings from Last 3 Encounters:  06/20/24 73.5 kg (162 lb)  05/08/24 75.8 kg (167 lb)  04/25/24 76.1 kg (167 lb 11.2 oz)   BP Readings from Last 3 Encounters:  06/20/24 (!) 151/95  05/08/24 122/80  04/25/24 118/76   Pulse Readings from Last 3 Encounters:  06/20/24 98  04/25/24 92  03/19/24 95     Labs:?  Lab Results  Component Value Date   HGBA1C 8.9 (A) 03/19/2024   HGBA1C 6.6 (H)  10/23/2023   HGBA1C >14.0 07/02/2023   GLUCOSE 109 (H) 11/05/2023   MICRALBCREAT 15 08/10/2023   MICRALBCREAT NOTE 09/27/2021   MICRALBCREAT 4 10/22/2020   CREATININE 0.56 11/05/2023   CREATININE 0.53 08/10/2023   CREATININE 0.50 09/19/2022    Lab Results  Component Value Date   CHOL 92 08/10/2023   LDLCALC 33 08/10/2023   LDLCALC 40 09/27/2021   LDLCALC 34 10/22/2020   HDL 46 (L) 08/10/2023   TRIG 57 08/10/2023   TRIG 60 09/27/2021   TRIG 46 10/22/2020   ALT 14 08/10/2023   ALT 25 09/19/2022   AST 14 08/10/2023   AST 18 09/19/2022      Chemistry      Component Value Date/Time   NA 143 11/05/2023 1007   NA 140 04/02/2015 0956   K 4.2 11/05/2023 1007   CL 112 (H) 11/05/2023 1007   CO2 21 (L) 11/05/2023 1007   BUN 20 11/05/2023 1007   BUN 10 08/07/2022 0000   CREATININE 0.56 11/05/2023 1007   CREATININE 0.53 08/10/2023 1130      Component Value Date/Time   CALCIUM  9.3 11/05/2023 1007   ALKPHOS 87 09/19/2022 0853   AST 14 08/10/2023 1130   ALT 14 08/10/2023 1130   BILITOT 0.4 08/10/2023 1130   BILITOT 0.2 04/02/2015 0956       The ASCVD Risk score (Arnett DK, et al., 2019) failed to calculate for the following reasons:   The valid total cholesterol range is 130 to 320 mg/dL  Assessment and Plan:   1. Diabetes, type 2: uncontrolled per last A1c of 8.9% (03/19/24), increased from previous, 6.6%. Goal <7% without hypoglycemia. ***  Current Regimen: metformin  XR 750 mg 2 tablets daily , Rybelsus  7 mg daily  HCM: {LZ HCM DM2:31604} Continue medications today without changes.  ***  Reviewed s/sx/tx hypoglycemia Future Consideration: GLP1-RA: ***. Hx negative for pancreatitis, personal/fam hx medullary thyroid  carcinoma or MEN2.  DPP4i: *** SGLT2i: *** Metformin : {LZ AP metformin :31605}; eGFR cannot be calculated (Patient's most recent lab result is older than the maximum 21 days allowed.).  SU: Not unreasonable, though defer given alternative options with  lower risk of hypoglycemia.  TZD: Avoiding due to possible weight gain/increase in fracture risk. ***CHF Insulin : Not unreasonable, though defer as last resort or as needed for severe hyperglycemia given risk hypoglycemia. Other safer options at this time.  Insulin : ***    2. HTN: uncontrolled based on last clinic BP of 151/95 mmHg (06/20/24), goal /<130/80 mmHg. Does not *** monitor BP at home. Denies lightheadedness, dizziness, SOB, CP, vision changes.  Current Regimen: *** Continue medications without changes.  Future Consideration: ACEi/ARB: *** CCB: *** Thiazide diuretic: *** MRA: *** Beta-Blocker: *** Hydralazine: *** BP Readings from Last 3 Encounters:  06/20/24 (!) 151/95  05/08/24 122/80  04/25/24 118/76     3. ASCVD (secondary prevention): controlled on last lipid panel with LDL 33 mg/dL, TG 57 mg/dL (11/04/72). LDL goal <55 mg/dL (primary prevention with diabetes and multiple risk factors).  Current Regimen: atorvastatin  40 mg daily Continue medications today without changes.  Future Consideration: Statin: *** Ezetimibe: *** PCSK9i: ***  Lab Results  Component Value Date   LDLCALC 33 08/10/2023   LDLCALC 40 09/27/2021   TRIG  57 08/10/2023   TRIG 60 09/27/2021    Follow Up Follow up with clinical pharmacist via *** in *** weeks Patient given direct line for questions regarding medication therapy  Future Appointments  Date Time Provider Department Center  06/30/2024 10:30 AM [facility]-PHARMACIST [facility] [place]  07/07/2024  1:15 PM Leigh Sober, MD [facility] None  07/29/2024  1:20 PM Glenard Mire, MD [facility] [place]  07/31/2024  9:30 AM [facility]-ANNUAL WELLNESS VISIT [facility] [place]  11/18/2024  9:30 AM Clois Fret, MD [facility] [place]    Prentice DOROTHA Favors, PharmD PGY1 Health-System Pharmacy Administration and Leadership Resident [facility]

## 2024-06-30 ENCOUNTER — Other Ambulatory Visit

## 2024-06-30 NOTE — Progress Notes (Unsigned)
 "  S:     Reason for visit: ?  Julie Wall is a 69 y.o. female with a history of diabetes (type 2), who presents today for an initial diabetes Telephone pharmacotherapy visit.? Pertinent PMH also includes HTN, atherosclerosis of the aorta, GERD, HLD, obesity.  They were referred to the pharmacist by their PCP for assistance in managing diabetes.  Care Team: Primary Care Provider: Sowles, Krichna, MD  At last visit with PCP on 04/25/24, patient reported inconsistent medication adherence. She was treated for vulvovaginal candidiasis at that time. She was instructed to initiate Rybelsus  3 mg daily for one month and then discontinue Jardiance  and increase Rybelsus  to 7 mg daily.   Today, she reports GU infection symptoms have resolved with fluconazole  use. She reports the pharmacy filled her Rybelsus  3 mg, instead of 7 mg. However, it appears that this was picked up 04/25/24, so she should be able to refill 7 mg at this time. She has a full bottle of Rybelsus  3 mg at home.   Current diabetes medications include: metformin  XR 750 mg 2 tablets daily, Rybelsus  3 mg daily, Jardiance  25 mg daily  Previous diabetes medications include: Janumet  Current hyperlipidemia medications include: atorvastatin  40 mg daily   Patient reports missing a dose of her medications once a week. Patient reports she uses a pill box to assist with adherence.   Have you been experiencing any side effects to the medications prescribed? no Do you have any problems obtaining medications due to transportation or finances? no Insurance coverage: [facility] Medicare/secondary Medicaid  Patient denies hypoglycemic events.  Reported home fasting blood sugars: not checking  Patient reports nocturia (nighttime urination) about 2 times a night at baseline  Patient reported dietary habits: Eats 2 meals/day Breakfast: cinnamon toast crunch cereal  Dinner: beans, greens, meat Drinks: zero pepsi, water   Patient-reported exercise  habits: none reported  DM Prevention:  Statin: Taking; high intensity.?  ACE/ARB: no Last urinary albumin/creatinine ratio:  Lab Results  Component Value Date   MICRALBCREAT 15 08/10/2023   MICRALBCREAT NOTE 09/27/2021   MICRALBCREAT 4 10/22/2020   MICRALBCREAT 4 09/05/2019   MICRALBCREAT NOTE 10/21/2018   MICRALBCREAT 216 (H) 03/05/2018   Last eye exam:  Lab Results  Component Value Date   HMDIABEYEEXA No Retinopathy 02/07/2024   Lab Results  Component Value Date   HMDIABEYEEXA No Retinopathy 02/07/2024   Last foot exam: 08/04/2015 Tobacco Use:  Tobacco Use: Medium Risk (06/20/2024)   Patient History    Smoking Tobacco Use: Former    Smokeless Tobacco Use: Never    Passive Exposure: Not on file   O:  Vitals:  Wt Readings from Last 3 Encounters:  06/20/24 162 lb (73.5 kg)  05/08/24 167 lb (75.8 kg)  04/25/24 167 lb 11.2 oz (76.1 kg)   BP Readings from Last 3 Encounters:  06/20/24 (!) 151/95  05/08/24 122/80  04/25/24 118/76   Pulse Readings from Last 3 Encounters:  06/20/24 98  04/25/24 92  03/19/24 95     Labs:?  Lab Results  Component Value Date   HGBA1C 8.9 (A) 03/19/2024   HGBA1C 6.6 (H) 10/23/2023   HGBA1C >14.0 07/02/2023   GLUCOSE 109 (H) 11/05/2023   MICRALBCREAT 15 08/10/2023   MICRALBCREAT NOTE 09/27/2021   MICRALBCREAT 4 10/22/2020   CREATININE 0.56 11/05/2023   CREATININE 0.53 08/10/2023   CREATININE 0.50 09/19/2022    Lab Results  Component Value Date   CHOL 92 08/10/2023   LDLCALC 33 08/10/2023   LDLCALC  40 09/27/2021   LDLCALC 34 10/22/2020   HDL 46 (L) 08/10/2023   TRIG 57 08/10/2023   TRIG 60 09/27/2021   TRIG 46 10/22/2020   ALT 14 08/10/2023   ALT 25 09/19/2022   AST 14 08/10/2023   AST 18 09/19/2022      Chemistry      Component Value Date/Time   NA 143 11/05/2023 1007   NA 140 04/02/2015 0956   K 4.2 11/05/2023 1007   CL 112 (H) 11/05/2023 1007   CO2 21 (L) 11/05/2023 1007   BUN 20 11/05/2023 1007   BUN 10  08/07/2022 0000   CREATININE 0.56 11/05/2023 1007   CREATININE 0.53 08/10/2023 1130      Component Value Date/Time   CALCIUM  9.3 11/05/2023 1007   ALKPHOS 87 09/19/2022 0853   AST 14 08/10/2023 1130   ALT 14 08/10/2023 1130   BILITOT 0.4 08/10/2023 1130   BILITOT 0.2 04/02/2015 0956       The ASCVD Risk score (Arnett DK, et al., 2019) failed to calculate for the following reasons:   The valid total cholesterol range is 130 to 320 mg/dL  Lab Results  Component Value Date   MICRALBCREAT 15 08/10/2023   MICRALBCREAT NOTE 09/27/2021   MICRALBCREAT 4 10/22/2020   MICRALBCREAT 4 09/05/2019   MICRALBCREAT NOTE 10/21/2018   MICRALBCREAT 216 (H) 03/05/2018    A/P: Diabetes currently uncontrolled with a most recent A1c of 8.9% on 03/19/24. Patient is able to verbalize appropriate hypoglycemia management plan. Medication adherence appears suboptimal. Unable to fully assess control d/t lack of BG readings. Patient currently has a full bottle of Rybelsus  3 mg at home, however, this was filled on 04/25/24. Patient should be able to refill Rybelsus  7 mg at this time and take two tablets of the 3 mg dose.  -Increased dose of GLP-1 Rybelsus  (semaglutide ) to 7 mg daily. Instructed she can take two of the 3 mg tablets -Discontinued SGLT2-I Jardiance  (empagliflozin ) 25 mg daily as instructed by PCP. May be able to re-initiate when BG is closer to goal with close monitoring of GU infection symptoms -Continued metformin  XR 750 mg BID -Extensively discussed pathophysiology of diabetes, recommended lifestyle interventions, dietary effects on blood sugar control.  -Counseled on s/sx of and management of hypoglycemia.  -Next A1c anticipated 06/2024.  -Counseled on adherence measures, such as phone alarms. Offered to have follow up in person to assist with setting up phone alarms, but patient declined.  -Counseled to monitor FBP 2-3x per week. Offered in-person education on BG meter; patient declined.    ASCVD risk - secondary prevention in patient with diabetes. Last LDL is 48 mg/dL, at goal of <44 mg/dL. -Continued atorvastatin  40 mg daily.   Patient verbalized understanding of treatment plan. Total time patient counseling 45 minutes.  Follow-up:  Pharmacist on 06/30/24 PCP clinic visit on 07/29/24  Peyton CHARLENA Ferries, PharmD, CPP Clinical Pharmacist [facility] [phone]   "

## 2024-07-03 ENCOUNTER — Encounter: Payer: Self-pay | Admitting: Obstetrics

## 2024-07-03 NOTE — Progress Notes (Unsigned)
 "  Referring Provider:  Dorette Loron MD  HPI:  Satoria Dunlop is a 69 y.o.  [phone]  who presents today for evaluation and management of abnormal cervical cytology.    Prior pap smears:  04/25/24:  LSIL HPV negative 05/25/22:  ASCUS HPV negative 10/26/20:  LSIL HPV negative 04/18/17:  ASCUS HPV negative 10/02/13:  ASCUS HPV negative  Prior cervical / vaginal findings: *** Date:*** Results: ***  Prior cervical treatment(s): *** Date:*** Results: ***  Symptoms/History:  -Abnormal vaginal discharge: *** -Postmenopausal: Yes -Intermenstrual bleeding: *** -Postcoital bleeding: *** -Bleeding problems (non-gyn): *** -Contraception: *** -Number of current sexual partners: *** -Number of partners in lifetime: *** -History of a high risk partner: *** -History of STDs: *** -Smoking: *** -Gardasil Vaccine: ***      ROS:  {Ros - complete:30496}  OB History  Gravida Para Term Preterm AB Living  3 3 3   3   SAB IAB Ectopic Multiple Live Births      3    # Outcome Date GA Lbr Len/2nd Weight Sex Type Anes PTL Lv  3 Term           2 Term           1 Term             Past Medical History:  Diagnosis Date   Allergy    Arthritis    ASCUS favor benign 09/2013   negative HPV   Atopic dermatitis    Dermatologist at [facility]   Chronic low back pain    COPD (chronic obstructive pulmonary disease) (HCC)    Decreased exercise tolerance    Dental caries    Diabetes mellitus without complication (HCC)    Dyspnea    GERD (gastroesophageal reflux disease)    Hand dermatitis 10/13/2021   a.) PCR (+) 10/13/2021 and 08/17/2022 for MSSA + SDSE (Streptococcus dysgalactiae ss equisimilis)   Hyperlipidemia    Hypertension    Legally blind in left eye, as defined in [place]     Lumbosacral neuritis    Microalbuminuria    Osteopenia    Ovarian failure    Sleep apnea    no cpap   Tobacco use     Past Surgical History:  Procedure Laterality Date   APPLICATION OF INTRAOPERATIVE CT SCAN N/A  10/02/2022   Procedure: APPLICATION OF INTRAOPERATIVE CT SCAN;  Surgeon: Clois Fret, MD;  Location: [facility];  Service: Neurosurgery;  Laterality: N/A;   APPLICATION OF INTRAOPERATIVE CT SCAN N/A 11/16/2023   Procedure: APPLICATION OF INTRAOPERATIVE CT SCAN;  Surgeon: Clois Fret, MD;  Location: [facility];  Service: Neurosurgery;  Laterality: N/A;   CATARACT EXTRACTION Bilateral 06/05/1978   CESAREAN SECTION     x 2   CORNEAL TRANSPLANT Left    [facility]-Treatment for blindness.   EYE SURGERY     FAR LATERAL DECOMPRESSION 1 LEVEL Right 11/16/2023   Procedure: FAR LATERAL DECOMPRESSION 1 LEVEL;  Surgeon: Clois Fret, MD;  Location: [facility];  Service: Neurosurgery;  Laterality: Right;  RIGHT L5-S1 FAR LATERAL FORAMINOTOMY, L3-4 DECOMPRESSION   TRANSFORAMINAL LUMBAR INTERBODY FUSION W/ MIS 1 LEVEL N/A 11/16/2023   Procedure: L4-5 MINIMALLY INVASIVE (MIS) TRANSFORAMINAL LUMBAR INTERBODY FUSION (TLIF);  Surgeon: Clois Fret, MD;  Location: [facility];  Service: Neurosurgery;  Laterality: N/A;  L4-5 TLIF    SOCIAL HISTORY:  Social History   Substance and Sexual Activity  Alcohol Use No   Alcohol/week: 0.0 standard drinks of alcohol    Social History  Substance and Sexual Activity  Drug Use No     Family History  Problem Relation Age of Onset   Diabetes Mother    Hypertension Mother    Pancreatic cancer Mother    Alcohol abuse Father    Arthritis/Rheumatoid Sister    Heart failure Sister    Diabetes Sister    Kidney disease Sister    Diabetes Brother    Breast cancer Neg Hx     ALLERGIES:  Penicillin g and Penicillins  She has a current medication list which includes the following prescription(s): albuterol , aqualance lancets 30g, aspirin  ec, atorvastatin , blood glucose monitoring suppl, trelegy ellipta , gabapentin , accu-chek guide test, halobetasol, metformin , methocarbamol , rinvoq, rybelsus , and triamcinolone  cream.  Physical Exam: -Vitals:  There  were no vitals taken for this visit.  Physical Exam   PROCEDURE: Colposcopy performed with 4% acetic acid and Lugol's after informed consent obtained. Urine pregnancy test negative***.                           -Aceto-white Lesions Location(s): See above              -Biopsy performed at *** o'clock               -ECC indicated and performed: {yes no:314532}     -Biopsy sites made hemostatic with pressure and Monsel's solution   -Satisfactory colposcopy: {yes no:314532}    -Evidence of Invasive cervical CA :  NO  ASSESSMENT:  Reene Warnke is a 70 y.o. [id number] with LSIL***ASCUS and HPV-HR positive*** 16/18/45 POS***NEG on recent pap (DATE) with ***, here for colposcopy today, performed as above without complications.  -ECC and *** cervical bx sent to pathology -Aftercare instructions for home reviewed, si/sx of when to call/return discussed. -Gardasil counseling done, #1 given today *** pt to consider.  -Discussed possible outcomes based on pathology; will call with results   Estil Mangle, DO  [facility] [place] "

## 2024-07-07 ENCOUNTER — Encounter: Admitting: Obstetrics

## 2024-07-07 DIAGNOSIS — R87612 Low grade squamous intraepithelial lesion on cytologic smear of cervix (LGSIL): Secondary | ICD-10-CM

## 2024-07-29 ENCOUNTER — Ambulatory Visit: Admitting: Family Medicine

## 2024-07-31 ENCOUNTER — Ambulatory Visit: Payer: 59

## 2024-11-18 ENCOUNTER — Ambulatory Visit: Admitting: Neurosurgery
